# Patient Record
Sex: Female | Born: 1960 | Race: Black or African American | Hispanic: No | Marital: Married | Smoking: Never smoker
Health system: Southern US, Community
[De-identification: ages and names within clinical notes are randomized; demographics above are authoritative.]

## PROBLEM LIST (undated history)

## (undated) DIAGNOSIS — Z87442 Personal history of urinary calculi: Secondary | ICD-10-CM

## (undated) DIAGNOSIS — Z9289 Personal history of other medical treatment: Secondary | ICD-10-CM

## (undated) DIAGNOSIS — E669 Obesity, unspecified: Secondary | ICD-10-CM

## (undated) DIAGNOSIS — I10 Essential (primary) hypertension: Secondary | ICD-10-CM

## (undated) DIAGNOSIS — E119 Type 2 diabetes mellitus without complications: Secondary | ICD-10-CM

## (undated) DIAGNOSIS — F32A Depression, unspecified: Secondary | ICD-10-CM

## (undated) DIAGNOSIS — F329 Major depressive disorder, single episode, unspecified: Secondary | ICD-10-CM

## (undated) DIAGNOSIS — E785 Hyperlipidemia, unspecified: Secondary | ICD-10-CM

## (undated) DIAGNOSIS — F209 Schizophrenia, unspecified: Secondary | ICD-10-CM

## (undated) DIAGNOSIS — Z973 Presence of spectacles and contact lenses: Secondary | ICD-10-CM

## (undated) DIAGNOSIS — N200 Calculus of kidney: Secondary | ICD-10-CM

## (undated) DIAGNOSIS — E876 Hypokalemia: Secondary | ICD-10-CM

## (undated) HISTORY — PX: WISDOM TOOTH EXTRACTION: SHX21

## (undated) HISTORY — DX: Hypokalemia: E87.6

## (undated) HISTORY — DX: Personal history of other medical treatment: Z92.89

## (undated) HISTORY — DX: Obesity, unspecified: E66.9

## (undated) HISTORY — DX: Major depressive disorder, single episode, unspecified: F32.9

## (undated) HISTORY — DX: Essential (primary) hypertension: I10

## (undated) HISTORY — DX: Depression, unspecified: F32.A

## (undated) HISTORY — DX: Schizophrenia, unspecified: F20.9

## (undated) HISTORY — DX: Personal history of urinary calculi: Z87.442

## (undated) HISTORY — DX: Hyperlipidemia, unspecified: E78.5

## (undated) HISTORY — DX: Presence of spectacles and contact lenses: Z97.3

---

## 1997-06-26 ENCOUNTER — Inpatient Hospital Stay (HOSPITAL_COMMUNITY): Admission: AD | Admit: 1997-06-26 | Discharge: 1997-07-10 | Payer: Self-pay | Admitting: Specialist

## 1999-12-13 ENCOUNTER — Encounter: Admission: RE | Admit: 1999-12-13 | Discharge: 1999-12-13 | Payer: Self-pay | Admitting: Emergency Medicine

## 1999-12-13 ENCOUNTER — Encounter: Payer: Self-pay | Admitting: Emergency Medicine

## 2006-08-22 ENCOUNTER — Emergency Department (HOSPITAL_COMMUNITY): Admission: EM | Admit: 2006-08-22 | Discharge: 2006-08-22 | Payer: Self-pay | Admitting: Emergency Medicine

## 2006-09-06 ENCOUNTER — Encounter: Admission: RE | Admit: 2006-09-06 | Discharge: 2006-09-06 | Payer: Self-pay | Admitting: Internal Medicine

## 2006-09-18 ENCOUNTER — Other Ambulatory Visit: Admission: RE | Admit: 2006-09-18 | Discharge: 2006-09-18 | Payer: Self-pay | Admitting: Obstetrics and Gynecology

## 2008-06-23 ENCOUNTER — Ambulatory Visit (HOSPITAL_COMMUNITY): Admission: RE | Admit: 2008-06-23 | Discharge: 2008-06-23 | Payer: Self-pay | Admitting: Cardiovascular Disease

## 2009-01-03 ENCOUNTER — Emergency Department (HOSPITAL_COMMUNITY): Admission: EM | Admit: 2009-01-03 | Discharge: 2009-01-04 | Payer: Self-pay | Admitting: Emergency Medicine

## 2009-01-04 ENCOUNTER — Ambulatory Visit: Payer: Self-pay | Admitting: Psychiatry

## 2009-01-04 ENCOUNTER — Inpatient Hospital Stay (HOSPITAL_COMMUNITY): Admission: RE | Admit: 2009-01-04 | Discharge: 2009-01-19 | Payer: Self-pay | Admitting: Psychiatry

## 2009-01-10 ENCOUNTER — Ambulatory Visit (HOSPITAL_COMMUNITY): Admission: RE | Admit: 2009-01-10 | Discharge: 2009-01-10 | Payer: Self-pay | Admitting: Psychiatry

## 2009-03-28 DIAGNOSIS — Z9289 Personal history of other medical treatment: Secondary | ICD-10-CM

## 2009-03-28 HISTORY — DX: Personal history of other medical treatment: Z92.89

## 2009-06-25 ENCOUNTER — Emergency Department (HOSPITAL_COMMUNITY): Admission: EM | Admit: 2009-06-25 | Discharge: 2009-06-25 | Payer: Self-pay | Admitting: Emergency Medicine

## 2009-12-24 ENCOUNTER — Emergency Department (HOSPITAL_COMMUNITY): Admission: EM | Admit: 2009-12-24 | Discharge: 2009-12-25 | Payer: Self-pay | Admitting: Emergency Medicine

## 2009-12-24 ENCOUNTER — Ambulatory Visit: Payer: Self-pay | Admitting: Psychiatry

## 2009-12-24 ENCOUNTER — Inpatient Hospital Stay (HOSPITAL_COMMUNITY)
Admission: RE | Admit: 2009-12-24 | Discharge: 2009-12-28 | Payer: Self-pay | Source: Home / Self Care | Admitting: Psychiatry

## 2010-06-10 LAB — URINALYSIS, ROUTINE W REFLEX MICROSCOPIC
Glucose, UA: NEGATIVE mg/dL
Hgb urine dipstick: NEGATIVE
Ketones, ur: >80 mg/dL — AB
Leukocytes, UA: NEGATIVE
Nitrite: NEGATIVE
Protein, ur: 100 mg/dL — AB
Specific Gravity, Urine: 1.021 (ref 1.005–1.030)
Urobilinogen, UA: 1 mg/dL (ref 0.0–1.0)
pH: 6 (ref 5.0–8.0)

## 2010-06-10 LAB — ETHANOL: Alcohol, Ethyl (B): <5 mg/dL (ref 0–10)

## 2010-06-10 LAB — DIFFERENTIAL
Basophils Absolute: 0 10*3/uL (ref 0.0–0.1)
Basophils Relative: 1 % (ref 0–1)
Eosinophils Absolute: 0 10*3/uL (ref 0.0–0.7)
Eosinophils Relative: 0 % (ref 0–5)
Lymphocytes Relative: 19 % (ref 12–46)
Lymphs Abs: 1.5 10*3/uL (ref 0.7–4.0)
Monocytes Absolute: 0.5 10*3/uL (ref 0.1–1.0)
Monocytes Relative: 6 % (ref 3–12)
Neutro Abs: 6.1 10*3/uL (ref 1.7–7.7)
Neutrophils Relative %: 74 % (ref 43–77)

## 2010-06-10 LAB — BASIC METABOLIC PANEL
BUN: 23 mg/dL (ref 6–23)
BUN: 12 mg/dL (ref 6–23)
CO2: 30 mEq/L (ref 19–32)
CO2: 29 mEq/L (ref 19–32)
Calcium: 9.7 mg/dL (ref 8.4–10.5)
Calcium: 9.2 mg/dL (ref 8.4–10.5)
Chloride: 102 mEq/L (ref 96–112)
Chloride: 101 mEq/L (ref 96–112)
Creatinine, Ser: 1.01 mg/dL (ref 0.4–1.2)
Creatinine, Ser: 0.67 mg/dL (ref 0.4–1.2)
GFR calc Af Amer: >60 mL/min (ref >60)
GFR calc Af Amer: >60 mL/min (ref >60)
GFR calc non Af Amer: 58 mL/min — ABNORMAL LOW (ref >60)
GFR calc non Af Amer: >60 mL/min (ref >60)
Glucose, Bld: 117 mg/dL — ABNORMAL HIGH (ref 70–99)
Glucose, Bld: 108 mg/dL — ABNORMAL HIGH (ref 70–99)
Potassium: 3.2 mEq/L — ABNORMAL LOW (ref 3.5–5.1)
Potassium: 2.4 mEq/L — CL (ref 3.5–5.1)
Sodium: 139 mEq/L (ref 135–145)
Sodium: 140 mEq/L (ref 135–145)

## 2010-06-10 LAB — RAPID URINE DRUG SCREEN, HOSP PERFORMED
Amphetamines: NOT DETECTED
Barbiturates: NOT DETECTED
Benzodiazepines: NOT DETECTED
Cocaine: NOT DETECTED
Opiates: NOT DETECTED
Tetrahydrocannabinol: NOT DETECTED

## 2010-06-10 LAB — CBC
HCT: 39 % (ref 36.0–46.0)
Hemoglobin: 13.5 g/dL (ref 12.0–15.0)
MCH: 31.4 pg (ref 26.0–34.0)
MCHC: 34.8 g/dL (ref 30.0–36.0)
MCV: 90.3 fL (ref 78.0–100.0)
Platelets: 317 10*3/uL (ref 150–400)
RBC: 4.31 MIL/uL (ref 3.87–5.11)
RDW: 12.6 % (ref 11.5–15.5)
WBC: 8.1 10*3/uL (ref 4.0–10.5)

## 2010-06-10 LAB — URINE MICROSCOPIC-ADD ON

## 2010-06-10 LAB — PREGNANCY, URINE: Preg Test, Ur: NEGATIVE

## 2010-06-10 LAB — POTASSIUM
Potassium: 2.9 mEq/L — ABNORMAL LOW (ref 3.5–5.1)
Potassium: 3.3 mEq/L — ABNORMAL LOW (ref 3.5–5.1)

## 2010-06-10 LAB — MAGNESIUM: Magnesium: 1.9 mg/dL (ref 1.5–2.5)

## 2010-07-01 LAB — BASIC METABOLIC PANEL
BUN: 14 mg/dL (ref 6–23)
BUN: 16 mg/dL (ref 6–23)
BUN: 21 mg/dL (ref 6–23)
CO2: 29 mEq/L (ref 19–32)
CO2: 29 mEq/L (ref 19–32)
CO2: 30 mEq/L (ref 19–32)
Calcium: 8.5 mg/dL (ref 8.4–10.5)
Calcium: 8.7 mg/dL (ref 8.4–10.5)
Calcium: 8.9 mg/dL (ref 8.4–10.5)
Chloride: 100 mEq/L (ref 96–112)
Chloride: 105 mEq/L (ref 96–112)
Chloride: 106 mEq/L (ref 96–112)
Creatinine, Ser: 0.81 mg/dL (ref 0.4–1.2)
Creatinine, Ser: 0.91 mg/dL (ref 0.4–1.2)
Creatinine, Ser: 0.97 mg/dL (ref 0.4–1.2)
GFR calc Af Amer: >60 mL/min (ref >60)
GFR calc Af Amer: >60 mL/min (ref >60)
GFR calc Af Amer: >60 mL/min (ref >60)
GFR calc non Af Amer: >60 mL/min (ref >60)
GFR calc non Af Amer: >60 mL/min (ref >60)
GFR calc non Af Amer: >60 mL/min (ref >60)
Glucose, Bld: 113 mg/dL — ABNORMAL HIGH (ref 70–99)
Glucose, Bld: 160 mg/dL — ABNORMAL HIGH (ref 70–99)
Glucose, Bld: 95 mg/dL (ref 70–99)
Potassium: 2.3 mEq/L — CL (ref 3.5–5.1)
Potassium: 2.8 mEq/L — ABNORMAL LOW (ref 3.5–5.1)
Potassium: 3.5 mEq/L (ref 3.5–5.1)
Sodium: 140 mEq/L (ref 135–145)
Sodium: 142 mEq/L (ref 135–145)
Sodium: 142 mEq/L (ref 135–145)

## 2010-07-01 LAB — RAPID URINE DRUG SCREEN, HOSP PERFORMED
Amphetamines: NOT DETECTED
Barbiturates: NOT DETECTED
Benzodiazepines: NOT DETECTED
Cocaine: NOT DETECTED
Opiates: NOT DETECTED
Tetrahydrocannabinol: NOT DETECTED

## 2010-07-01 LAB — DIFFERENTIAL
Basophils Absolute: 0.1 10*3/uL (ref 0.0–0.1)
Basophils Relative: 1 % (ref 0–1)
Eosinophils Absolute: 0.3 10*3/uL (ref 0.0–0.7)
Eosinophils Relative: 3 % (ref 0–5)
Lymphocytes Relative: 19 % (ref 12–46)
Lymphs Abs: 2 10*3/uL (ref 0.7–4.0)
Monocytes Absolute: 0.7 10*3/uL (ref 0.1–1.0)
Monocytes Relative: 7 % (ref 3–12)
Neutro Abs: 7.3 10*3/uL (ref 1.7–7.7)
Neutrophils Relative %: 70 % (ref 43–77)

## 2010-07-01 LAB — CBC
HCT: 37.8 % (ref 36.0–46.0)
HCT: 37.4 % (ref 36.0–46.0)
Hemoglobin: 12.6 g/dL (ref 12.0–15.0)
Hemoglobin: 12.5 g/dL (ref 12.0–15.0)
MCHC: 33.5 g/dL (ref 30.0–36.0)
MCHC: 33.4 g/dL (ref 30.0–36.0)
MCV: 91.4 fL (ref 78.0–100.0)
MCV: 91.7 fL (ref 78.0–100.0)
Platelets: 353 10*3/uL (ref 150–400)
Platelets: 327 10*3/uL (ref 150–400)
RBC: 4.13 MIL/uL (ref 3.87–5.11)
RBC: 4.08 MIL/uL (ref 3.87–5.11)
RDW: 13.3 % (ref 11.5–15.5)
RDW: 13.4 % (ref 11.5–15.5)
WBC: 8.7 10*3/uL (ref 4.0–10.5)
WBC: 10.3 10*3/uL (ref 4.0–10.5)

## 2010-07-01 LAB — TRICYCLICS SCREEN, URINE: TCA Scrn: NOT DETECTED

## 2010-07-01 LAB — ETHANOL: Alcohol, Ethyl (B): <5 mg/dL (ref 0–10)

## 2010-07-02 ENCOUNTER — Emergency Department (HOSPITAL_COMMUNITY)
Admission: EM | Admit: 2010-07-02 | Discharge: 2010-07-04 | Disposition: A | Discharge status: Other Acute Inpatient Hospital | Payer: PRIVATE HEALTH INSURANCE | Attending: Emergency Medicine | Admitting: Emergency Medicine

## 2010-07-02 DIAGNOSIS — I10 Essential (primary) hypertension: Secondary | ICD-10-CM | POA: Insufficient documentation

## 2010-07-02 DIAGNOSIS — E876 Hypokalemia: Secondary | ICD-10-CM | POA: Insufficient documentation

## 2010-07-02 DIAGNOSIS — IMO0002 Reserved for concepts with insufficient information to code with codable children: Secondary | ICD-10-CM | POA: Insufficient documentation

## 2010-07-02 DIAGNOSIS — F29 Unspecified psychosis not due to a substance or known physiological condition: Secondary | ICD-10-CM | POA: Insufficient documentation

## 2010-07-02 LAB — URINALYSIS, ROUTINE W REFLEX MICROSCOPIC
Bilirubin Urine: NEGATIVE
Glucose, UA: NEGATIVE mg/dL
Hgb urine dipstick: NEGATIVE
Nitrite: NEGATIVE
Protein, ur: 100 mg/dL — AB
Specific Gravity, Urine: 1.023 (ref 1.005–1.030)
Urobilinogen, UA: 1 mg/dL (ref 0.0–1.0)
pH: 6.5 (ref 5.0–8.0)

## 2010-07-02 LAB — RAPID URINE DRUG SCREEN, HOSP PERFORMED
Amphetamines: NOT DETECTED
Barbiturates: NOT DETECTED
Benzodiazepines: NOT DETECTED
Cocaine: NOT DETECTED
Opiates: NOT DETECTED
Tetrahydrocannabinol: NOT DETECTED

## 2010-07-02 LAB — COMPREHENSIVE METABOLIC PANEL
ALT: 18 U/L (ref 0–35)
AST: 24 U/L (ref 0–37)
Albumin: 4.4 g/dL (ref 3.5–5.2)
Alkaline Phosphatase: 57 U/L (ref 39–117)
BUN: 14 mg/dL (ref 6–23)
CO2: 30 mEq/L (ref 19–32)
Calcium: 9.7 mg/dL (ref 8.4–10.5)
Chloride: 104 mEq/L (ref 96–112)
Creatinine, Ser: 0.85 mg/dL (ref 0.4–1.2)
GFR calc Af Amer: >60 mL/min (ref >60)
GFR calc non Af Amer: >60 mL/min (ref >60)
Glucose, Bld: 119 mg/dL — ABNORMAL HIGH (ref 70–99)
Potassium: 2.7 mEq/L — CL (ref 3.5–5.1)
Sodium: 146 mEq/L — ABNORMAL HIGH (ref 135–145)
Total Bilirubin: 1 mg/dL (ref 0.3–1.2)
Total Protein: 8 g/dL (ref 6.0–8.3)

## 2010-07-02 LAB — URINE MICROSCOPIC-ADD ON

## 2010-07-02 LAB — POCT CARDIAC MARKERS
CKMB, poc: 2.3 ng/mL (ref 1.0–8.0)
Myoglobin, poc: 64.4 ng/mL (ref 12–200)
Troponin i, poc: <0.05 ng/mL (ref 0.00–0.09)

## 2010-07-02 LAB — ETHANOL: Alcohol, Ethyl (B): <5 mg/dL (ref 0–10)

## 2010-07-02 LAB — ACETAMINOPHEN LEVEL: Acetaminophen (Tylenol), Serum: <10 ug/mL — ABNORMAL LOW (ref 10–30)

## 2010-07-02 LAB — SALICYLATE LEVEL: Salicylate Lvl: <4 mg/dL (ref 2.8–20.0)

## 2010-07-02 LAB — TRICYCLICS SCREEN, URINE: TCA Scrn: NOT DETECTED

## 2010-07-03 DIAGNOSIS — F200 Paranoid schizophrenia: Secondary | ICD-10-CM

## 2010-07-03 LAB — POCT I-STAT, CHEM 8
BUN: 16 mg/dL (ref 6–23)
BUN: 20 mg/dL (ref 6–23)
Calcium, Ion: 1.12 mmol/L (ref 1.12–1.32)
Calcium, Ion: 1.11 mmol/L — ABNORMAL LOW (ref 1.12–1.32)
Chloride: 100 mEq/L (ref 96–112)
Chloride: 99 mEq/L (ref 96–112)
Creatinine, Ser: 1.3 mg/dL — ABNORMAL HIGH (ref 0.4–1.2)
Creatinine, Ser: 1.4 mg/dL — ABNORMAL HIGH (ref 0.4–1.2)
Glucose, Bld: 106 mg/dL — ABNORMAL HIGH (ref 70–99)
Glucose, Bld: 127 mg/dL — ABNORMAL HIGH (ref 70–99)
HCT: 42 % (ref 36.0–46.0)
HCT: 43 % (ref 36.0–46.0)
Hemoglobin: 14.3 g/dL (ref 12.0–15.0)
Hemoglobin: 14.6 g/dL (ref 12.0–15.0)
Potassium: 2.4 mEq/L — CL (ref 3.5–5.1)
Potassium: 3.1 mEq/L — ABNORMAL LOW (ref 3.5–5.1)
Sodium: 143 mEq/L (ref 135–145)
Sodium: 142 mEq/L (ref 135–145)
TCO2: 28 mmol/L (ref 0–100)
TCO2: 31 mmol/L (ref 0–100)

## 2010-07-04 ENCOUNTER — Inpatient Hospital Stay (HOSPITAL_COMMUNITY)
Admission: AD | Admit: 2010-07-04 | Discharge: 2010-08-06 | DRG: 885 | Disposition: A | Discharge status: Home / Self Care | Payer: PRIVATE HEALTH INSURANCE | Source: Ambulatory Visit | Attending: Psychiatry | Admitting: Psychiatry

## 2010-07-04 DIAGNOSIS — I517 Cardiomegaly: Secondary | ICD-10-CM

## 2010-07-04 DIAGNOSIS — F29 Unspecified psychosis not due to a substance or known physiological condition: Principal | ICD-10-CM

## 2010-07-04 DIAGNOSIS — E876 Hypokalemia: Secondary | ICD-10-CM

## 2010-07-04 DIAGNOSIS — F201 Disorganized schizophrenia: Secondary | ICD-10-CM

## 2010-07-04 DIAGNOSIS — F200 Paranoid schizophrenia: Secondary | ICD-10-CM

## 2010-07-04 DIAGNOSIS — Z91199 Patient's noncompliance with other medical treatment and regimen due to unspecified reason: Secondary | ICD-10-CM

## 2010-07-04 DIAGNOSIS — I10 Essential (primary) hypertension: Secondary | ICD-10-CM

## 2010-07-04 DIAGNOSIS — F429 Obsessive-compulsive disorder, unspecified: Secondary | ICD-10-CM

## 2010-07-04 DIAGNOSIS — Z9119 Patient's noncompliance with other medical treatment and regimen: Secondary | ICD-10-CM

## 2010-07-05 NOTE — Consult Note (Signed)
Amy Casey, Amy Casey             ACCOUNT NO.:  [id number]  MEDICAL RECORD NO.:  [id number]           PATIENT TYPE:  E  LOCATION:  WLED                         FACILITY:  [facility]  PHYSICIAN:  Eulogio Ditch, MD DATE OF BIRTH:  [birth year]  DATE OF CONSULTATION:  07/03/2010 DATE OF DISCHARGE:                                CONSULTATION   REASON FOR CONSULT:  Noncompliant with medications, history of schizophrenia.  HISTORY OF PRESENT ILLNESS:  I saw the patient and reviewed the medical records.  A 50 year old [place] American female who was brought in by the husband as she was noncompliant with her medications.  The patient at this time is confused, unable to make logical conversation, poor eye contact.  The patient appeared to be responding to the inner stimuli. As per the husband, the patient does not take medication because she does not like the taste of the medication.  The patient also pacing in the hallway, did not sleep last night.  The patient denied any suicidal or homicidal ideations.  Denies hearing any voices.  The patient is paranoid at this time.  PAST PSYCHIATRIC HISTORY:  The patient has been admitted to behavioral health in the past in 2011 and was discharged on Risperdal 1.5 mg per day along with Cogentin 2 mg a day.  Currently, the patient is on fluoxetine and thiothixene.  MEDICAL HISTORY:  History of hypertension.  ALLERGIES:  No known drug allergies.  PHYSICAL EXAMINATION:  Done in the [facility] ED within normal limits. VITAL SIGNS:  Her blood pressure is high 206/90 and then at another time it was 211/128.  LABORATORY DATA:  UDS negative.  Alcohol level less than 5.  Labs within normal limits except for potassium, which is 2.7.  Physical examination done by Dr. Carleene Cooper is within normal limits.  MENTAL STATUS EXAMINATION:  The patient is fairly cooperative during the interview.  Poor eye contact.  No psychomotor agitation,  retardation noted during the interview, but earlier the patient was agitated during the nighttime and was given Geodon 20 mg IM.  Speech is soft, slow. Thought process, unable to provide logical information, seems to be internally preoccupied, confused.  The patient denies any suicidal or homicidal ideations.  Paranoid delusions present.  Alert, awake, oriented x3.  Memory immediate, recent, remote poor.  Attention concentration poor.  Abstraction ability poor.  Insight and judgment poor.  DIAGNOSES:  Axis I:  Schizophrenia, disorganized type. Axis II:  Deferred. Axis III:  Hypertension, heart murmur. Axis IV:  Noncompliant with her medications. Axis V:  30.  RECOMMENDATIONS: 1. The patient will be admitted to behavioral health for further     stabilization. 2. The patient will be started on Risperdal 1 mg twice a day along     with Benadryl 50 mg at bedtime.  The patient will be continued on     her hypertensive medications lisinopril 20 mg p.o. daily and     amlodipine 5 mg p.o. daily along with verapamil SR 250 mg p.o.     daily.     Eulogio Ditch, MD     SA/MEDQ  D:  07/03/2010  T:  07/03/2010  Job:  [id number]  Electronically Signed by Eulogio Ditch  on 07/05/2010 11:51:24 AM

## 2010-07-08 LAB — TSH: TSH: 0.686 u[IU]/mL (ref 0.350–4.500)

## 2010-07-08 LAB — BASIC METABOLIC PANEL
BUN: 22 mg/dL (ref 6–23)
CO2: 28 mEq/L (ref 19–32)
Calcium: 9.7 mg/dL (ref 8.4–10.5)
Chloride: 103 mEq/L (ref 96–112)
Creatinine, Ser: 1.04 mg/dL (ref 0.4–1.2)
GFR calc Af Amer: >60 mL/min (ref >60)
GFR calc non Af Amer: 56 mL/min — ABNORMAL LOW (ref >60)
Glucose, Bld: 111 mg/dL — ABNORMAL HIGH (ref 70–99)
Potassium: 3.8 mEq/L (ref 3.5–5.1)
Sodium: 142 mEq/L (ref 135–145)

## 2010-07-08 LAB — MAGNESIUM: Magnesium: 2.4 mg/dL (ref 1.5–2.5)

## 2010-07-13 NOTE — H&P (Signed)
Amy Casey, Amy Casey             ACCOUNT NO.:  [id number]  MEDICAL RECORD NO.:  [id number]           PATIENT TYPE:  I  LOCATION:  0405                          [facility]  [facility]  PHYSICIAN:  Eulogio Ditch, MD DATE OF BIRTH:  1960-05-02  DATE OF ADMISSION:  07/04/2010 DATE OF DISCHARGE:                      PSYCHIATRIC ADMISSION ASSESSMENT   HISTORY:  The patient is a 50 year old married African American female. Patient presented to the emergency room in the company of her husband who reported that the patient was having an altered mental status.  This would be the third admission for the patient.  She has had two previous admissions here at [facility].  The patient states that two of her medications were messed up causing her to have some mixed thoughts and notes that she was having auditory hallucinations versus speaking to God.  The patient states that she had missed some of  her medications. Her last visit at [facility] was in October 2011.  SOCIAL HISTORY:  She is married and lives with her husband who works for [employer].  She has a 6 year old daughter and an 7 year old son.  She works at [employer] on [address].  She is a nonsmoker, nondrinker.  No alcohol or drug abuse.  FAMILY HISTORY:  Not reliable at this point.  PAST MEDICAL HISTORY: 1. She has a history of severe hypertension. 2. She sees Dr. Francene Castle as her psychiatrist and [facility] in [place]. 3. She also has a history of a heart murmur.  CURRENT MEDICATIONS: 1. Saphris sublingual as needed.  The patient reports that she had not     taken that in several days. 2. Lisinopril 40 mg p.o. daily. 3. Fluoxetine 20 mg p.o. daily. 4. Norvasc and notes that multiple medications have been changed     recently.  ALLERGIES:  DENIES ANY CURRENT DRUG ALLERGIES.  PHYSICAL EXAMINATION:  Physical exam was in the emergency room by Dr. Carleene Cooper and is on the chart. The patient had an unremarkable physical exam with the exception of a change in her EKG which showed left axis deviation with mild left ventricular hypertrophy.  Cardiac enzymes were done and all were normal. Physical exam did not reveal anything remarkable.  The hypokalemia was treated to a respectable amount of 3.1.  LABORATORY DATA:  Other pertinent labs include a negative drug screen. Acetaminophen level less than 10.  Initial comprehensive metabolic profile showed an elevated sodium of 146 as well as a glucose of 119. Alcohol level was less than 5.  Salicylate was less than 4.  Cardiac markers were within normal limits.  Repeat comprehensive metabolic profile showed elevated sodium of 146, alarming potassium at 2.7 which was recompensated in the emergency room and treated successfully to a level of 3.1.  Urine was checked again for tricyclics and also none were detected there.  Urine showed trace ketones as well as protein of 100 mg/dL, positive for leukocyte esterase and moderate amount.  Microscopic showed a few epithelials and rare bacteria.  MENTAL STATUS EXAM:  Today, the patient is alert and  oriented to location and date.  She is a well-developed, well-nourished [place] female, casually dressed.  Speech is rambling and disorganized. She is cooperative.  Her mood is calm.  Her affect is flat.  Thought process, again she is endorsing auditory hallucinations, feeling that God is speaking directly to her.  Cognition unable to assess.  She denies suicidality or homicidality and is of at least average intelligence.  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 AXIS II:  No diagnosis. AXIS III:  Hypertension, heart murmur, abnormal electrocardiogram, medication noncompliant and the burden of chronic illness. AXIS IV:  She has good family support. AXIS V:  Current global assessment of functioning 40, past year  unknown.  PLAN:  The patient will be admitted for treatment and stabilization. Tentative length of stay is 3-5 days.    ______________________________ Verne Spurr, PA   ______________________________ Eulogio Ditch, MD    NM/MEDQ  D:  07/05/2010  T:  07/05/2010  Job:  [id number]  Electronically Signed by Verne Spurr  on 07/12/2010 04:08:10 PM Electronically Signed by Eulogio Ditch  on 07/13/2010 06:55:25 PM

## 2010-07-14 LAB — URINE MICROSCOPIC-ADD ON

## 2010-07-14 LAB — URINALYSIS, ROUTINE W REFLEX MICROSCOPIC
Glucose, UA: NEGATIVE mg/dL
Ketones, ur: 40 mg/dL — AB
Nitrite: NEGATIVE
Protein, ur: NEGATIVE mg/dL
Specific Gravity, Urine: 1.024 (ref 1.005–1.030)
Urobilinogen, UA: 1 mg/dL (ref 0.0–1.0)
pH: 5.5 (ref 5.0–8.0)

## 2010-07-14 LAB — COMPREHENSIVE METABOLIC PANEL
ALT: 26 U/L (ref 0–35)
AST: 18 U/L (ref 0–37)
Albumin: 3.9 g/dL (ref 3.5–5.2)
Alkaline Phosphatase: 46 U/L (ref 39–117)
BUN: 20 mg/dL (ref 6–23)
CO2: 28 mEq/L (ref 19–32)
Calcium: 9.5 mg/dL (ref 8.4–10.5)
Chloride: 104 mEq/L (ref 96–112)
Creatinine, Ser: 0.96 mg/dL (ref 0.4–1.2)
GFR calc Af Amer: >60 mL/min (ref >60)
GFR calc non Af Amer: >60 mL/min (ref >60)
Glucose, Bld: 194 mg/dL — ABNORMAL HIGH (ref 70–99)
Potassium: 3.8 mEq/L (ref 3.5–5.1)
Sodium: 140 mEq/L (ref 135–145)
Total Bilirubin: 0.5 mg/dL (ref 0.3–1.2)
Total Protein: 7.6 g/dL (ref 6.0–8.3)

## 2010-07-27 DIAGNOSIS — F209 Schizophrenia, unspecified: Secondary | ICD-10-CM

## 2010-08-06 NOTE — Discharge Summary (Signed)
Amy Casey, Amy Casey             ACCOUNT NO.:  [id number]  MEDICAL RECORD NO.:  [id number]           PATIENT TYPE:  I  LOCATION:  0405                          [facility]  PHYSICIAN:  Eulogio Ditch, MD DATE OF BIRTH:  [birth year]  DATE OF ADMISSION:  07/04/2010 DATE OF DISCHARGE:  08/06/2010                              DISCHARGE SUMMARY   HISTORY OF PRESENT ILLNESS:  A 50 year old African American female who was admitted from [facility] ED where she was brought in by the husband for altered mental status.  Patient was very disorganized.  She reported that her medications were messed up byherself.  HOSPITAL COURSE:  Patient was admitted to [facility].  At the time of admission, patient was very disorganized, was unable to talk logically, was selectively mute, was internally preoccupied.  Her affect was constricted.  As patient was getting Invega in the outpatient setting, patient was started on Risperdal 1 mg twice a day, along with Saphris 10 mg p.o. daily and 20 mg at bedtime.  On July 06, 2010, her Risperdal was changed to 2 mg at bedtime.  On July 07, 2010, her Risperdal was discontinued.  Patient was also given Haldol 5 mg IM every 6 hours p.r.n. for agitation.  On July 09, 2010, her Saphris was reduced to 20 mg at bedtime as patient was very sedated, confused, lying all the time in the bed.  Patient was also started on Klonopin 0.5 mg twice a day as patient was feeling very anxious.  On July 10, 2010, her Saphris was reduced to 10 mg at bedtime and started on Cogentin 1 mg twice a day as patient was drooling.  On July 14, 2010, patient was started on Invega 9 mg, along with Prozac 60 mg p.o. daily, and her Saphris was discontinued, along with Cogentin and the Klonopin.  On July 17, 2010, patient was seen on the weekend by Dr. Lolly Mustache and Hinda Glatter was reduced to 6 mg as patient was sedated.  As patient was not responding to the [facility] also, patient was  started on Zyprexa 10 mg at bedtime on July 26, 2010, and her Hinda Glatter later on on Jul 29, 2010, was discontinued.  Patient showed a good response to the Zyprexa which was increased on Aug 02, 2010, to 20 mg.  By Jul 30, 2010, the patient started talking logically, her affect improved, was able to make a good conversation.  No side effects like EPS were present.  As patient was doing much better and denies hearing any voices, denies paranoid behavior, it was thought that patient will do better in her home environment and there is no need to transfer her to [facility] so patient will be discharged back to home today.  Psychoeducation given to the patient that she should remain compliant with her medication and follow up regularly in the outpatient setting.  DISCHARGE DIAGNOSES:  AXIS I:  Psychosis, not otherwise specified, rule out schizophrenia, disorganized type. AXIS II:  Deferred. AXIS III:  Hypertension. AXIS IV:  Chronic mental health issues. AXIS V:  50.  DISCHARGE FOLLOWUP:  Patient will follow with Dr. Mila Homer  at [facility], phone number [phone], appointment on Aug 09, 2010, at 12 p.m.  DISCHARGE MEDICATIONS: 1. Prozac 60 mg p.o. daily. 2. Lisinopril 40 mg p.o. daily. 3. Norvasc 5 mg p.o. daily. 4. Cogentin 2 mg at bedtime. 5. Zyprexa 20 mg at bedtime.     Eulogio Ditch, MD     SA/MEDQ  D:  08/06/2010  T:  08/06/2010  Job:  [id number]  Electronically Signed by Eulogio Ditch  on 08/06/2010 01:54:41 PM
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
## 2011-10-01 IMAGING — CR DG SHOULDER 2+V*L*
3 series · 3 of 3 positions shown · non-contrast
Comparison: None

CLINICAL DATA: Left shoulder pain.

LEFT SHOULDER - 2+ VIEW

[w shoulder ap internal left]
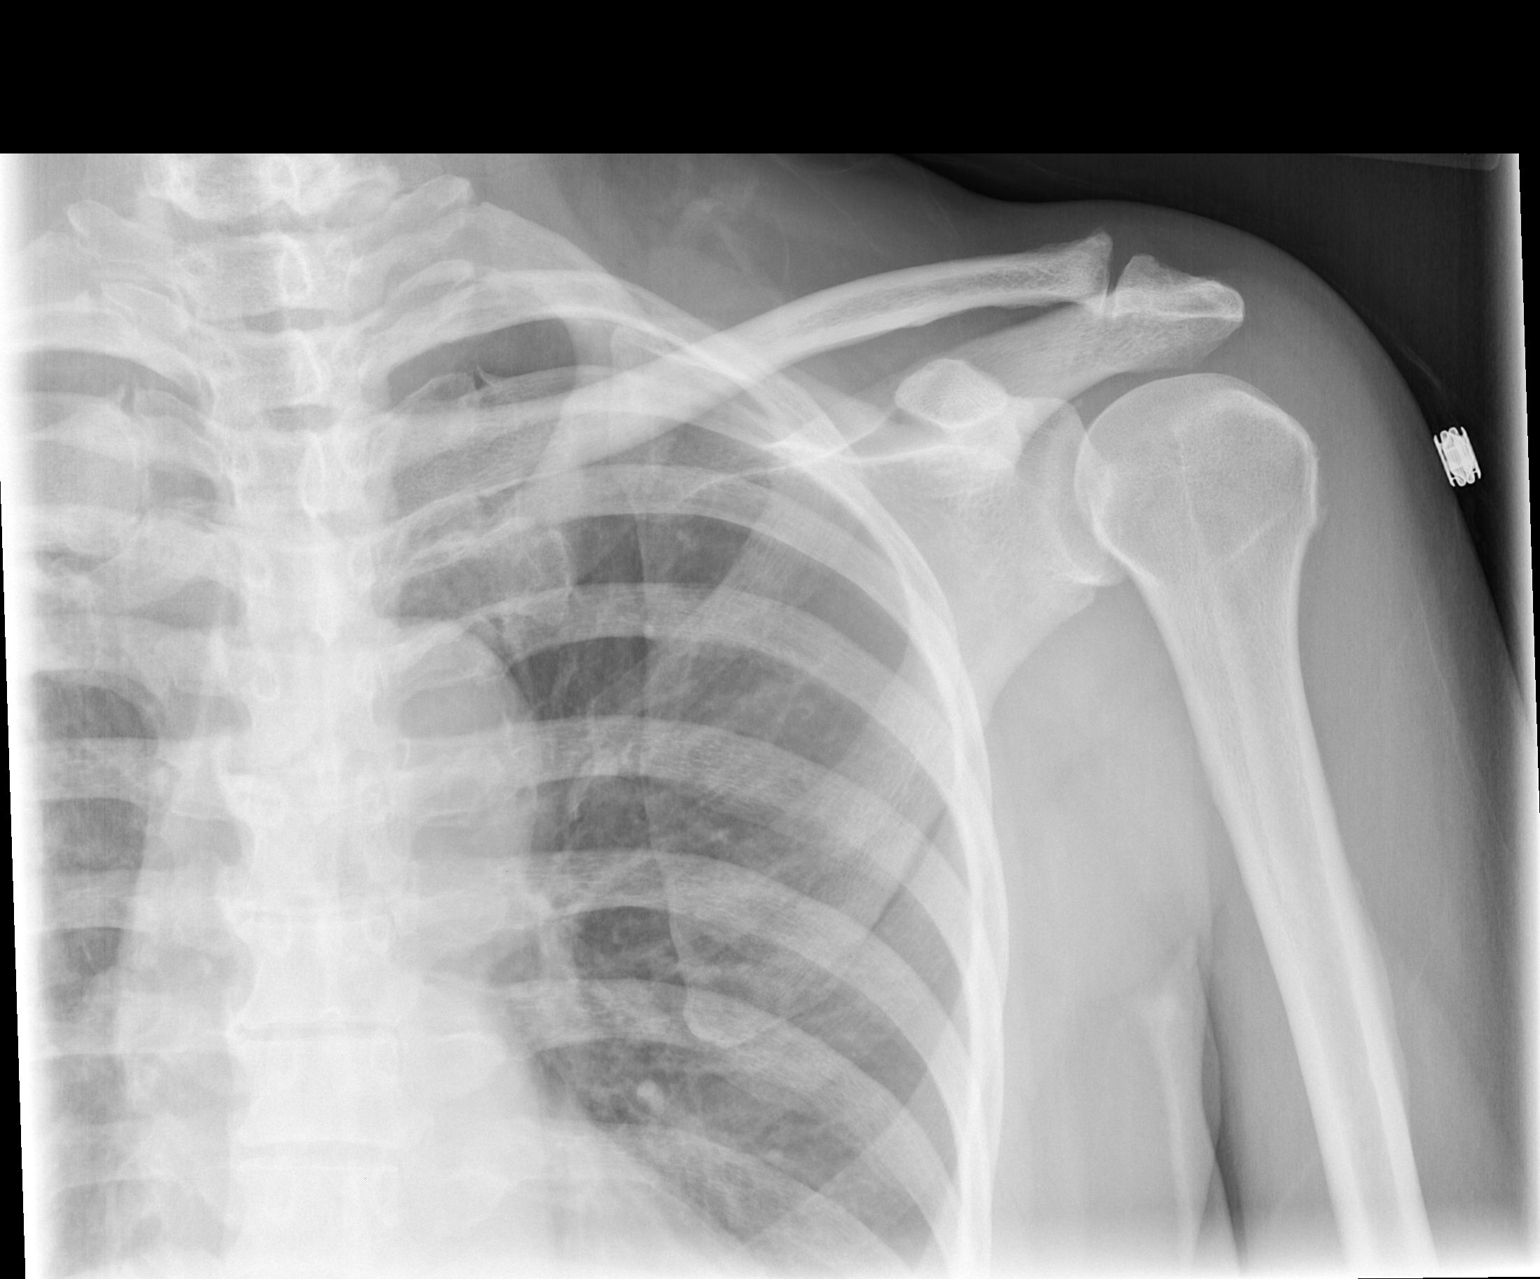

[w shoulder ap external left]
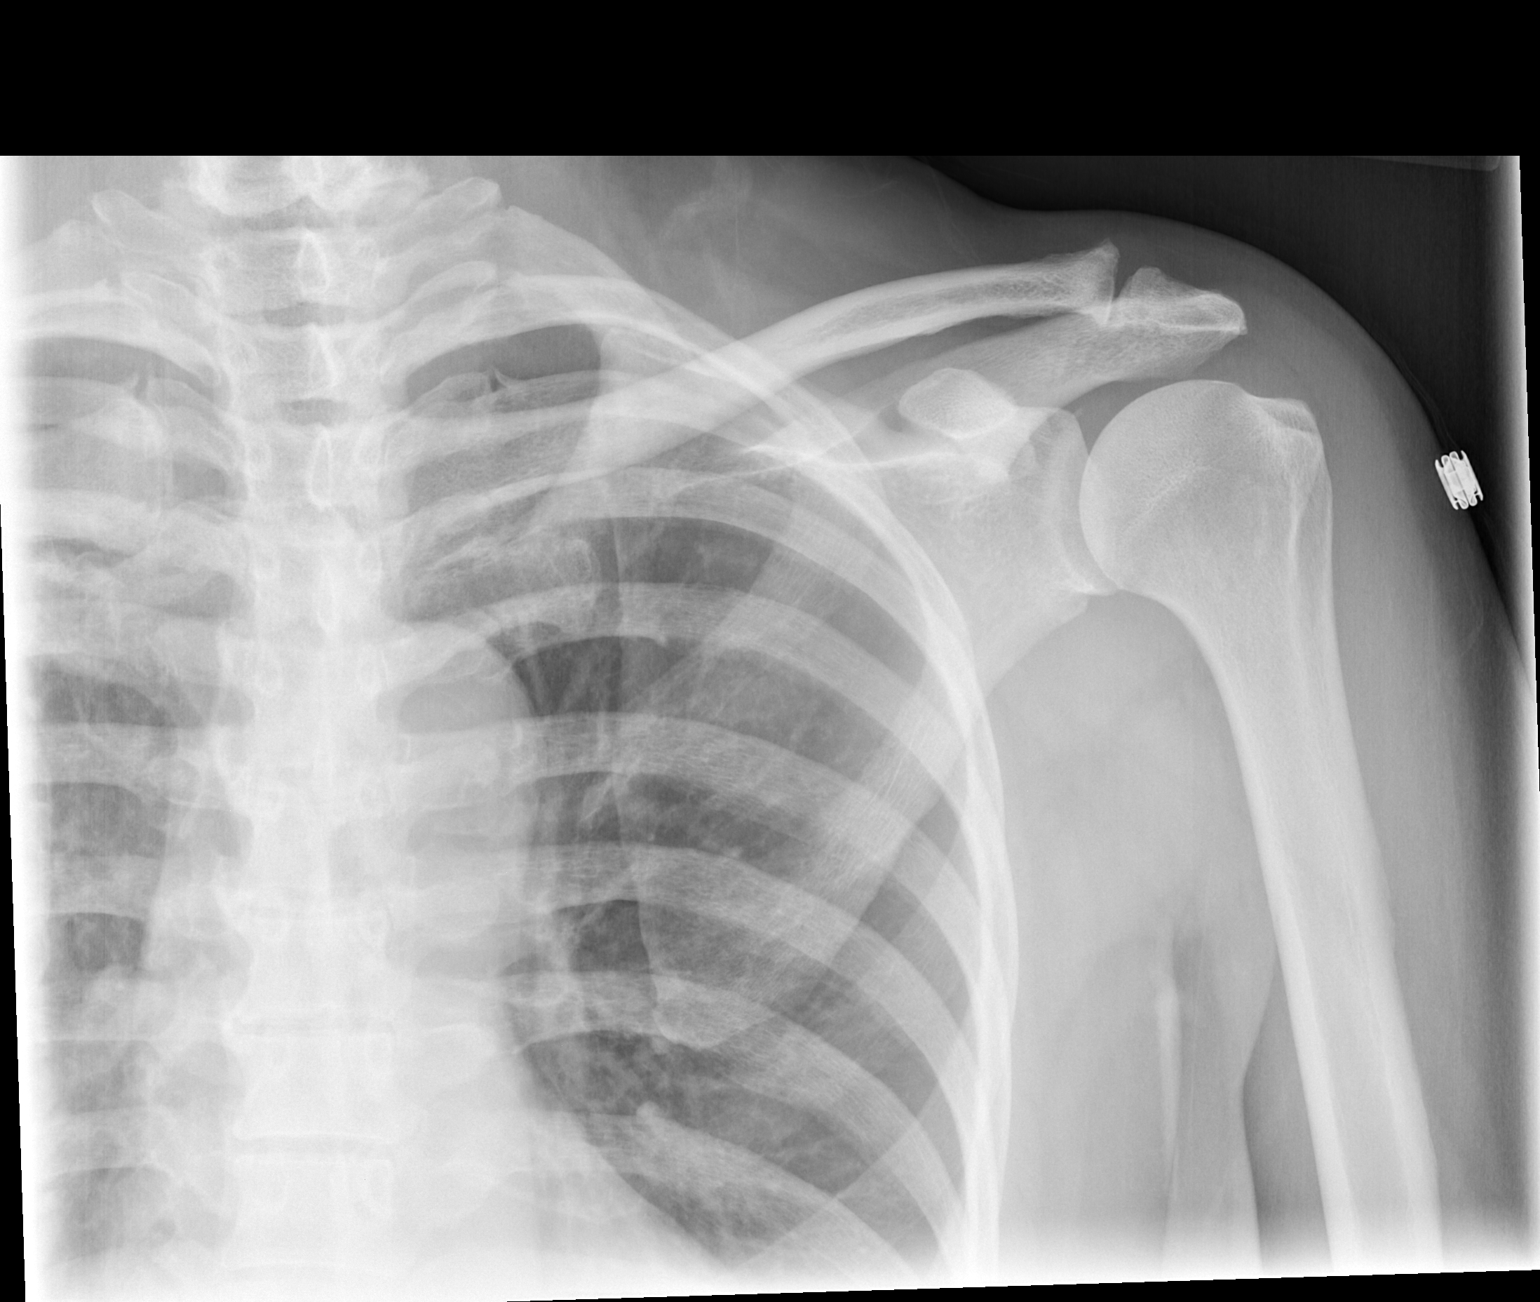

[w shoulder y view left]
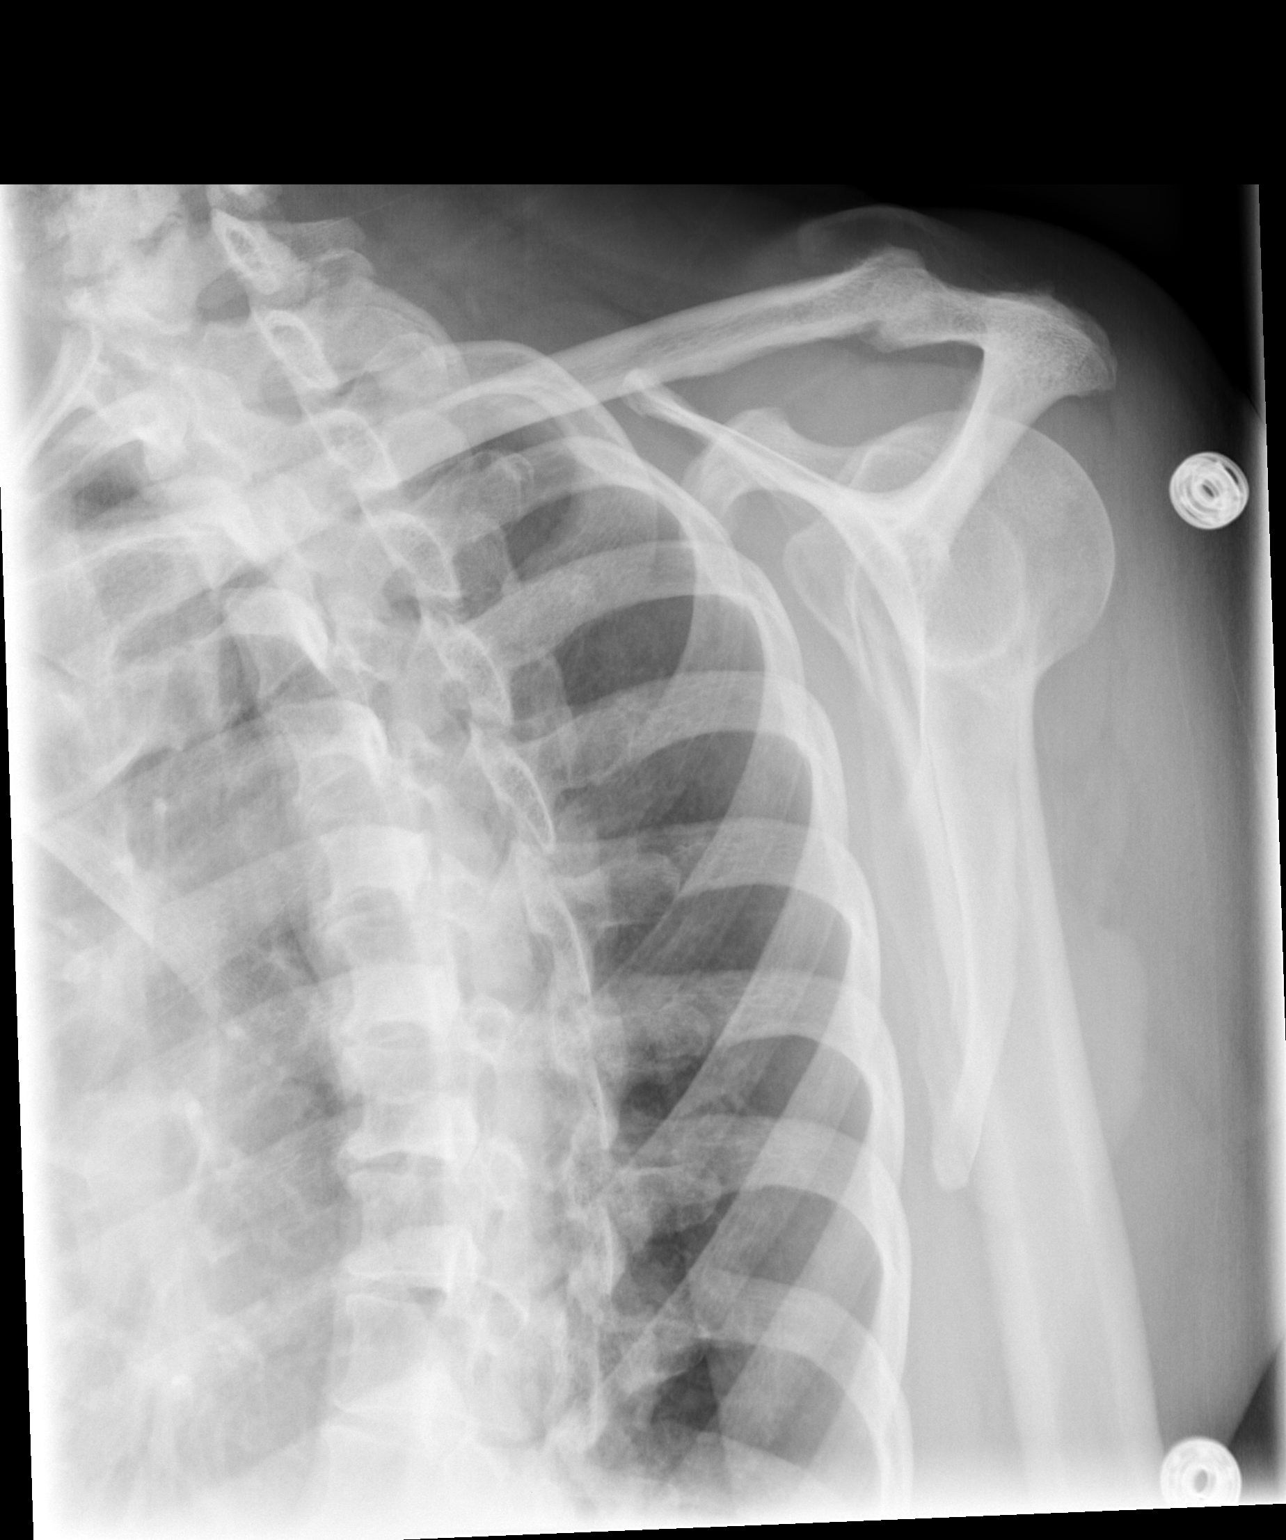

[3 of 3 positions shown; findings below may reference images not displayed]

FINDINGS: There are mild AC joint degenerative changes.  No acute
bony findings.  The left lung apex is clear.  No upper left-sided
rib fractures are seen.
IMPRESSION: No acute bony findings.

## 2012-02-10 ENCOUNTER — Ambulatory Visit (INDEPENDENT_AMBULATORY_CARE_PROVIDER_SITE_OTHER): Payer: No Typology Code available for payment source | Admitting: Medical

## 2012-02-10 ENCOUNTER — Encounter: Payer: Self-pay | Admitting: Medical

## 2012-02-10 ENCOUNTER — Other Ambulatory Visit (HOSPITAL_COMMUNITY)
Admission: RE | Admit: 2012-02-10 | Discharge: 2012-02-10 | Disposition: A | Discharge status: Home / Self Care | Payer: No Typology Code available for payment source | Source: Ambulatory Visit | Attending: Medical | Admitting: Medical

## 2012-02-10 ENCOUNTER — Telehealth: Payer: Self-pay | Admitting: Internal Medicine

## 2012-02-10 VITALS — BP 142/90 | HR 60 | Temp 98.0°F | Resp 16 | Ht 69.0 in | Wt 204.0 lb

## 2012-02-10 DIAGNOSIS — K029 Dental caries, unspecified: Secondary | ICD-10-CM

## 2012-02-10 DIAGNOSIS — Z124 Encounter for screening for malignant neoplasm of cervix: Secondary | ICD-10-CM

## 2012-02-10 DIAGNOSIS — M79606 Pain in leg, unspecified: Secondary | ICD-10-CM

## 2012-02-10 DIAGNOSIS — Z0000 Encounter for general adult medical examination without abnormal findings: Secondary | ICD-10-CM

## 2012-02-10 DIAGNOSIS — Z1231 Encounter for screening mammogram for malignant neoplasm of breast: Secondary | ICD-10-CM

## 2012-02-10 DIAGNOSIS — R7301 Impaired fasting glucose: Secondary | ICD-10-CM

## 2012-02-10 DIAGNOSIS — E669 Obesity, unspecified: Secondary | ICD-10-CM

## 2012-02-10 DIAGNOSIS — M79609 Pain in unspecified limb: Secondary | ICD-10-CM

## 2012-02-10 DIAGNOSIS — Z1211 Encounter for screening for malignant neoplasm of colon: Secondary | ICD-10-CM

## 2012-02-10 DIAGNOSIS — I10 Essential (primary) hypertension: Secondary | ICD-10-CM

## 2012-02-10 DIAGNOSIS — Z23 Encounter for immunization: Secondary | ICD-10-CM

## 2012-02-10 DIAGNOSIS — Z1239 Encounter for other screening for malignant neoplasm of breast: Secondary | ICD-10-CM

## 2012-02-10 DIAGNOSIS — R21 Rash and other nonspecific skin eruption: Secondary | ICD-10-CM

## 2012-02-10 DIAGNOSIS — Z01419 Encounter for gynecological examination (general) (routine) without abnormal findings: Secondary | ICD-10-CM | POA: Insufficient documentation

## 2012-02-10 DIAGNOSIS — Z1151 Encounter for screening for human papillomavirus (HPV): Secondary | ICD-10-CM | POA: Insufficient documentation

## 2012-02-10 LAB — LIPID PANEL
Cholesterol: 180 mg/dL (ref 0–200)
HDL: 57 mg/dL (ref >39)
LDL Cholesterol: 98 mg/dL (ref 0–99)
Total CHOL/HDL Ratio: 3.2 Ratio
Triglycerides: 125 mg/dL (ref <150)
VLDL: 25 mg/dL (ref 0–40)

## 2012-02-10 LAB — COMPREHENSIVE METABOLIC PANEL
ALT: 12 U/L (ref 0–35)
AST: 14 U/L (ref 0–37)
Albumin: 4.5 g/dL (ref 3.5–5.2)
Alkaline Phosphatase: 60 U/L (ref 39–117)
BUN: 15 mg/dL (ref 6–23)
CO2: 28 mEq/L (ref 19–32)
Calcium: 9.4 mg/dL (ref 8.4–10.5)
Chloride: 104 mEq/L (ref 96–112)
Creat: 0.76 mg/dL (ref 0.50–1.10)
Glucose, Bld: 88 mg/dL (ref 70–99)
Potassium: 3.4 mEq/L — ABNORMAL LOW (ref 3.5–5.3)
Sodium: 140 mEq/L (ref 135–145)
Total Bilirubin: 0.3 mg/dL (ref 0.3–1.2)
Total Protein: 7.3 g/dL (ref 6.0–8.3)

## 2012-02-10 LAB — CBC WITH DIFFERENTIAL/PLATELET
Basophils Absolute: 0 10*3/uL (ref 0.0–0.1)
Basophils Relative: 0 % (ref 0–1)
Eosinophils Absolute: 0.1 10*3/uL (ref 0.0–0.7)
Eosinophils Relative: 2 % (ref 0–5)
HCT: 38.4 % (ref 36.0–46.0)
Hemoglobin: 12.9 g/dL (ref 12.0–15.0)
Lymphocytes Relative: 34 % (ref 12–46)
Lymphs Abs: 2.2 10*3/uL (ref 0.7–4.0)
MCH: 28.7 pg (ref 26.0–34.0)
MCHC: 33.6 g/dL (ref 30.0–36.0)
MCV: 85.5 fL (ref 78.0–100.0)
Monocytes Absolute: 0.4 10*3/uL (ref 0.1–1.0)
Monocytes Relative: 7 % (ref 3–12)
Neutro Abs: 3.8 10*3/uL (ref 1.7–7.7)
Neutrophils Relative %: 57 % (ref 43–77)
Platelets: 337 10*3/uL (ref 150–400)
RBC: 4.49 MIL/uL (ref 3.87–5.11)
RDW: 13.7 % (ref 11.5–15.5)
WBC: 6.6 10*3/uL (ref 4.0–10.5)

## 2012-02-10 LAB — POCT URINALYSIS DIPSTICK
Bilirubin, UA: NEGATIVE
Blood, UA: NEGATIVE
Glucose, UA: NEGATIVE
Ketones, UA: NEGATIVE
Leukocytes, UA: NEGATIVE
Nitrite, UA: NEGATIVE
Protein, UA: NEGATIVE
Spec Grav, UA: <=1.005
Urobilinogen, UA: NEGATIVE
pH, UA: 7

## 2012-02-10 LAB — TSH: TSH: 1.514 u[IU]/mL (ref 0.350–4.500)

## 2012-02-10 MED ORDER — LISINOPRIL-HYDROCHLOROTHIAZIDE 20-12.5 MG PO TABS: 1.0000 | ORAL_TABLET | Freq: Every day | ORAL | Status: DC

## 2012-02-10 MED ORDER — AMLODIPINE BESYLATE 5 MG PO TABS: 5.0000 mg | ORAL_TABLET | Freq: Every day | ORAL | Status: DC

## 2012-02-10 MED ORDER — TRIAMCINOLONE ACETONIDE 0.1 % EX CREA: TOPICAL_CREAM | Freq: Two times a day (BID) | CUTANEOUS | Status: DC

## 2012-02-10 NOTE — Progress Notes (Signed)
Subjective:   HPI  Amy Casey is a 51 y.o. female who presents for a complete physical.  No recent primary care.  Was going to urgent care prior.   Preventative care: Last ophthalmology visit:10/2011 Last dental visit:n/a Last colonoscopy: never Last mammogram:few years ago Last gynecological exam: 3 years ago Last EKG:yes Last labs:year ago  Prior vaccinations: TD or Tdap:unsure Influenza:no, declines Pneumococcal:n/a Shingles/Zostavax:n/a  Advanced directive:n/a Health care power of attorney:n/a Living will:n/a  Concerns: Gets leg pain in lower legs intermittent, achy, not cramps.  Better with activity, worse at rest.  Denies RLS.    HTN - compliant, but has been on verapamil and amlodipine in the past.   Reviewed their medical, surgical, family, social, medication, and allergy history and updated chart as appropriate.   Past Medical History  Diagnosis Date  . History of renal stone   . Wears glasses   . Hypertension   . Depression     Dr. Mila Homer, [facility]; hospitalization 07/2010  . Obesity   . History of echocardiogram 2011    [facility]    Past Surgical History  Procedure Date  . Wisdom tooth extraction     Family History  Problem Relation Age of Onset  . Diabetes Mother   . Kidney disease Mother     dialysis  . Heart disease Mother   . Cancer Maternal Uncle     lung  . Stroke Neg Hx   . Hypertension Neg Hx   . Hyperlipidemia Neg Hx     History   Social History  . Marital Status: Married    Spouse Name: N/A    Number of Children: N/A  . Years of Education: N/A   Occupational History  . Not on file.   Social History Main Topics  . Smoking status: Never Smoker   . Smokeless tobacco: Not on file  . Alcohol Use: No  . Drug Use: No  . Sexually Active: Not on file   Other Topics Concern  . 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 ARIPiprazole (ABILIFY) 20 MG tablet Take 20 mg by mouth daily.      Marland Kitchen FLUoxetine (PROZAC) 20 MG tablet Take 20 mg by mouth daily.      . potassium chloride (K-DUR) 10 MEQ tablet Take 10 mEq by mouth 2 (two) times daily.      Marland Kitchen lisinopril-hydrochlorothiazide (ZESTORETIC) 20-12.5 MG per tablet Take 1 tablet by mouth daily.  90 tablet  3    No Known Allergies   Review of Systems Constitutional: -fever, -chills, -sweats, +unexpected weight change, -decreased appetite, -fatigue Allergy: -sneezing, -itching, +congestion Dermatology: -changing moles, --rash, -lumps ENT: -runny nose, -ear pain, -sore throat, -hoarseness, -sinus pain, +teeth pain, - ringing in ears, -hearing loss, -nosebleeds Cardiology: -chest pain, -palpitations, -swelling, -difficulty breathing when lying flat, -waking up short of breath Respiratory: -cough, -shortness of breath, -difficulty breathing with exercise or exertion, -wheezing, -coughing up blood Gastroenterology: -abdominal pain, -nausea, -vomiting, -diarrhea, -constipation, -blood in stool, -changes in bowel movement, -difficulty swallowing or eating Hematology: -bleeding, -bruising  Musculoskeletal: -joint aches, +muscle aches, -joint swelling, -back pain, -neck pain, -cramping, -changes in gait Ophthalmology: denies vision changes, eye redness, itching, discharge Urology: -burning with urination, -difficulty urinating, -blood in urine, +urinary frequency, -urgency, -incontinence Neurology: -headache, -weakness, -tingling, +  numbness, -memory loss, -falls, +dizziness Psychology: -depressed mood, -agitation, +sleep problems     Objective:   Physical Exam  Filed Vitals:   02/10/12 1008  BP: 142/90  Pulse: 60  Temp: 98 F (36.7 C)  Resp: 16    General appearance: alert, no distress, WD/WN, AA female, obese Skin: right volar wrist and base of thumb with rough brown patch of skin,  possible eczema but nonspecific.  Scattered benign appearing macules throughout including right lateral beast, right upper back, no worrisome lesions HEENT: normocephalic, conjunctiva/corneas normal, sclerae anicteric, PERRLA, EOMi, nares patent, no discharge or erythema, pharynx normal Oral cavity: MMM, tongue normal, few teeth with obvious decay Neck: supple, no lymphadenopathy, no thyromegaly, no masses, normal ROM, no bruits Chest: non tender, normal shape and expansion Heart: RRR, normal S1, S2, no murmurs Lungs: CTA bilaterally, no wheezes, rhonchi, or rales Abdomen: +bs, soft, non tender, non distended, no masses, no hepatomegaly, no splenomegaly, no bruits Back: non tender, normal ROM, no scoliosis Musculoskeletal: upper extremities non tender, no obvious deformity, normal ROM throughout, lower extremities non tender, no obvious deformity, normal ROM throughout Extremities: no edema, no cyanosis, no clubbing Pulses: 2+ symmetric, upper and lower extremities, normal cap refill Neurological: alert, oriented x 3, CN2-12 intact, strength normal upper extremities and lower extremities, sensation normal throughout, DTRs 2+ throughout, no cerebellar signs, gait normal Psychiatric: normal affect, behavior normal, pleasant  Breast: nontender, no masses or lumps, no skin changes, no nipple discharge or inversion, no axillary lymphadenopathy Gyn: Normal external genitalia without lesions, vagina with normal mucosa, cervix without lesions, no cervical motion tenderness, no abnormal vaginal discharge.  Uterus and adnexa not enlarged, nontender, no masses.  Pap performed.  Exam chaperoned by nurse. Rectal: deferred    Assessment and Plan :    Encounter Diagnoses  Name Primary?  . Routine general medical examination at a health care facility Yes  . Essential hypertension, benign   . Tooth decay   . Screening for breast cancer   . Screening for cervical cancer   . Need for Tdap vaccination   .  Obesity   . Impaired fasting blood sugar   . Screen for colon cancer   . Rash   . Leg pain, inferior    Physical exam - discussed healthy lifestyle, diet, exercise, preventative care, vaccinations, and addressed their concerns.  Handout given.  Declines flu vaccine.  HTN -  C/t Lisinopril/HCT 20/12.5mg  daily.  Add amlodipine 5mg  daily.  Recheck 4mo.  Will request prior cardiology records.   Tooth decay - f/u with dentist ASAP  Mammogram appt set  Pap sent  tdap vaccine, VIS and counseling given  Obesity - discussed need to work on lifestyle changes  impaired fasting glucose - HgbA1C today  Advised colonoscopy.  She declines for now until after mammogram/labs.  Will plan to do in 2014.  Rash - script for triamcinolone.  Recheck 4mo  Leg pain - etiology unclear.   Follow-up pending lab.

## 2012-02-10 NOTE — Telephone Encounter (Signed)
Message copied by Joslyn Hy on Fri Feb 10, 2012  4:55 PM ------      Message from: Jac Canavan      Created: Fri Feb 10, 2012  1:36 PM       Call her back.   i resent her current medication for BP, but I actually added Amlodipine 5mg  daily.  She has been on this prior.  There is no way to increase the lisinopril without increasing the fluid pill.  I mis spoke not thinking about the dose she was on.   Thus, c/t Lisinopril HCT 20/12.5 daily but add Amlodipine 5mg  daily as well for BP.   recheck 98mo

## 2012-02-10 NOTE — Patient Instructions (Signed)

## 2012-02-10 NOTE — Telephone Encounter (Signed)
Called and left a message on pt vm letting her know to keep same bp now and also take an additional amlodipine 5mg   And to call and make an appt for a recheck in 1 month

## 2012-03-08 ENCOUNTER — Encounter: Payer: Self-pay | Admitting: Internal Medicine

## 2012-03-23 ENCOUNTER — Ambulatory Visit
Admission: RE | Admit: 2012-03-23 | Discharge: 2012-03-23 | Disposition: A | Discharge status: Home / Self Care | Payer: No Typology Code available for payment source | Source: Ambulatory Visit | Attending: Medical | Admitting: Medical

## 2012-03-23 DIAGNOSIS — Z1231 Encounter for screening mammogram for malignant neoplasm of breast: Secondary | ICD-10-CM

## 2012-04-02 ENCOUNTER — Telehealth: Payer: Self-pay | Admitting: Family Medicine

## 2012-04-02 NOTE — Telephone Encounter (Signed)
Patient states that she is having pains in her legs and feet when she drives for a period of time or sitting for a while. She would like to know can she get some medication to help with ? CLS

## 2012-04-03 NOTE — Telephone Encounter (Signed)
Have her return for f/u OV on this to discuss

## 2012-04-03 NOTE — Telephone Encounter (Signed)
I left a message on the patients cell phone making her aware that she will need an OV. CLS

## 2012-04-06 ENCOUNTER — Encounter: Payer: Self-pay | Admitting: Medical

## 2012-04-06 ENCOUNTER — Ambulatory Visit (INDEPENDENT_AMBULATORY_CARE_PROVIDER_SITE_OTHER): Payer: BC Managed Care – PPO | Admitting: Medical

## 2012-04-06 VITALS — BP 150/110 | HR 78 | Temp 98.1°F | Resp 16 | Wt 212.0 lb

## 2012-04-06 DIAGNOSIS — M25549 Pain in joints of unspecified hand: Secondary | ICD-10-CM

## 2012-04-06 DIAGNOSIS — M79604 Pain in right leg: Secondary | ICD-10-CM

## 2012-04-06 DIAGNOSIS — I10 Essential (primary) hypertension: Secondary | ICD-10-CM

## 2012-04-06 DIAGNOSIS — R252 Cramp and spasm: Secondary | ICD-10-CM

## 2012-04-06 DIAGNOSIS — M79609 Pain in unspecified limb: Secondary | ICD-10-CM

## 2012-04-06 DIAGNOSIS — E876 Hypokalemia: Secondary | ICD-10-CM

## 2012-04-06 MED ORDER — MAGNESIUM 100 MG PO TABS: 1.0000 | ORAL_TABLET | Freq: Every day | ORAL | Status: DC

## 2012-04-06 MED ORDER — MELOXICAM 15 MG PO TABS: 15.0000 mg | ORAL_TABLET | Freq: Every day | ORAL | Status: DC

## 2012-04-06 NOTE — Patient Instructions (Addendum)
You should have Kdur or potassium 10 mEq at home.  You are currently using this once daily.  Increase to one tablet twice daily.    Begin Magnesium tablets 100mg  once daily.  Both potassium and magnesium will be used to see if they help the leg pain/cramps.    Make sure you drink at least 6 big glasses of water daily.  Make sure you are taking both of your blood pressure medications every day.    For the hand pain, this sound like arthritis.  i want to start you on a once daily medication called Mobic.  Lets see if this doesn't give you some relief.  I want to see you back in 1 month to recheck your blood pressure and these symptoms.

## 2012-04-06 NOTE — Progress Notes (Signed)
Subjective: Here for leg pains and fingers drawing up.    She reports pains in her legs x 1 year or so.  When she gets up, can barely walk due to pains in lower legs and feet, bilat.  Sometimes pain when sitting.  She does toss and turn when in the bed due to leg pain.  Pain is dull, not sharp.  Denies burning pain.  Once she gets to walking the pain can go away.   Pain is not above her knees.  Pain is anytime, both day and night, but then she says it is worse when sitting.  So her story isn't quite clear. sometimes has to move the legs to get them to calm down.   Saw a doctor in the past for her feet though, and was prescribed a medication that started with a "D."    She notes that she can't close her hands all the way x a few months.  Worse in the morning.  Feels like the finger knuckles swell, especially in the morning.  Denies warmth.   No wrist problem, just in fingers, bilat.    She is taking both her Lisinopril HCT and the new Amlodipine we started last visit but she hasn't taken any of her medications today as she hasn't eaten yet.   Past Medical History  Diagnosis Date  . History of renal stone   . Wears glasses   . Hypertension   . Depression     Dr. Mila Homer, [facility]; hospitalization 07/2010  . Obesity   . History of echocardiogram 2011    [facility]   ROS  Gen: no fever, night sweats Neuro: no numbness, but sometimes tingling in the hands, +weakness, bilat, no numbness in the legs or tingling Back: no pain MSK: sometimes gets left shoulder pains Heart: no CP, palpitations, edema Lungs: negative    Objective: Gen: wd, wn, nad Skin: toenails thickened of toes, no nail pitting or other deformity of fingernails.  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  Wrist and rest of UE unremarkable.  Tender along bilat calves, otherwise LE nontender, no obvious deformity, normal LE ROM.   Pulses normal 2+ UE and LE Cap refil normal Neuro: normal UE and LE strength, sensation and DTRs  Assessment: Encounter Diagnoses  Name Primary?  . Essential hypertension, benign Yes  . Arthralgia of hand   . Leg pain, bilateral   . Leg cramps   . Hypokalemia    Plan: Patient Instructions  You should have Kdur or potassium 10 mEq at home.  You are currently using this once daily.  Increase to one tablet twice daily.    Begin Magnesium tablets 100mg  once daily.  Both potassium and magnesium will be used to see if they help the leg pain/cramps.    Make sure you drink at least 6 big glasses of water daily.  Make sure you are taking both of your blood pressure medications every day.    For the hand pain, this sound like arthritis.  i want to start you on a once daily medication called Mobic.  Lets see  if this doesn't give you some relief.  I want to see you back in 1 month to recheck your blood pressure and these symptoms.

## 2012-05-07 ENCOUNTER — Encounter: Payer: Self-pay | Admitting: Medical

## 2012-05-07 ENCOUNTER — Ambulatory Visit (INDEPENDENT_AMBULATORY_CARE_PROVIDER_SITE_OTHER): Payer: BC Managed Care – PPO | Admitting: Medical

## 2012-05-07 VITALS — BP 150/100 | HR 60 | Temp 98.1°F | Resp 16 | Wt 213.0 lb

## 2012-05-07 DIAGNOSIS — R29898 Other symptoms and signs involving the musculoskeletal system: Secondary | ICD-10-CM

## 2012-05-07 DIAGNOSIS — M79609 Pain in unspecified limb: Secondary | ICD-10-CM

## 2012-05-07 DIAGNOSIS — R252 Cramp and spasm: Secondary | ICD-10-CM

## 2012-05-07 DIAGNOSIS — M25649 Stiffness of unspecified hand, not elsewhere classified: Secondary | ICD-10-CM

## 2012-05-07 DIAGNOSIS — M79641 Pain in right hand: Secondary | ICD-10-CM

## 2012-05-07 DIAGNOSIS — Q849 Congenital malformation of integument, unspecified: Secondary | ICD-10-CM

## 2012-05-07 DIAGNOSIS — E876 Hypokalemia: Secondary | ICD-10-CM

## 2012-05-07 DIAGNOSIS — Q829 Congenital malformation of skin, unspecified: Secondary | ICD-10-CM

## 2012-05-07 DIAGNOSIS — I10 Essential (primary) hypertension: Secondary | ICD-10-CM

## 2012-05-07 LAB — BASIC METABOLIC PANEL
BUN: 20 mg/dL (ref 6–23)
CO2: 29 mEq/L (ref 19–32)
Calcium: 9.5 mg/dL (ref 8.4–10.5)
Chloride: 103 mEq/L (ref 96–112)
Creat: 0.83 mg/dL (ref 0.50–1.10)
Glucose, Bld: 101 mg/dL — ABNORMAL HIGH (ref 70–99)
Potassium: 3.2 mEq/L — ABNORMAL LOW (ref 3.5–5.3)
Sodium: 142 mEq/L (ref 135–145)

## 2012-05-07 LAB — URIC ACID: Uric Acid, Serum: 5.7 mg/dL (ref 2.4–7.0)

## 2012-05-07 MED ORDER — DICLOFENAC SODIUM 1 % TD GEL: 2.0000 g | Freq: Four times a day (QID) | TRANSDERMAL | Status: DC

## 2012-05-08 ENCOUNTER — Encounter: Payer: Self-pay | Admitting: Medical

## 2012-05-08 ENCOUNTER — Other Ambulatory Visit: Payer: Self-pay | Admitting: Medical

## 2012-05-08 MED ORDER — POTASSIUM CHLORIDE ER 10 MEQ PO TBCR: 10.0000 meq | EXTENDED_RELEASE_TABLET | Freq: Two times a day (BID) | ORAL | Status: DC

## 2012-05-08 MED ORDER — AMLODIPINE BESYLATE 5 MG PO TABS: 5.0000 mg | ORAL_TABLET | Freq: Every day | ORAL | Status: DC

## 2012-05-08 MED ORDER — POTASSIUM CHLORIDE CRYS ER 20 MEQ PO TBCR: 20.0000 meq | EXTENDED_RELEASE_TABLET | Freq: Two times a day (BID) | ORAL | Status: DC

## 2012-05-08 NOTE — Progress Notes (Signed)
Subjective: Here for recheck on multiple issues.    Last visit we discussed her finger pain and decreased ROM.  She can't close her hands all the way x a few months.  Worse in the morning.  Feels like the finger knuckles swell, especially in the morning.  Denies warmth.   No wrist problem, just in fingers, bilat.  No improvement with Mobic.  The leg cramps and pains are much better after beginning either the magnesium and kdur or this plus the Mobic but she is not sure.    She is compliant with her lisinopril, but lost her norvasc, been out of this 3 weeks.  She has not symptoms today.  Past Medical History  Diagnosis Date  . History of renal stone   . Wears glasses   . Hypertension   . Depression     Dr. Mila Homer, [facility]; hospitalization 07/2010  . Obesity   . 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  Wrist and rest of UE unremarkable.   Pulses normal 2+ UE and LE Cap refill normal Neuro: normal UE and LE strength, sensation and DTRs  Assessment: Encounter Diagnoses  Name Primary?  . Bilateral hand pain Yes  . Decreased range of motion of finger   . Essential  hypertension, benign   . Hypokalemia   . Leg cramps   . Skin anomaly    Plan: Bilateral hand pain, decreased ROM fingers - uric acid lab and xrays today, still suspect OA.  No improvement on Mobic so stop this, but begin trial of Diclofenac gel topically  HTN - advise she not run out of her medications.   She has been out of Norvasc because she lost the bottle.  Advised she go get refill at pharmacy, c/t both mediations daily.    Hypokalemia - compliant with Kdur, BMET lab today  Leg cramps - much improved on OTC Mg and Kdur  Skin anomaly - refer to dermatology

## 2012-08-30 ENCOUNTER — Other Ambulatory Visit: Payer: Self-pay | Admitting: Medical

## 2012-08-30 MED ORDER — NAPROXEN 375 MG PO TABS: 375.0000 mg | ORAL_TABLET | Freq: Two times a day (BID) | ORAL | Status: DC

## 2012-08-31 ENCOUNTER — Telehealth: Payer: Self-pay | Admitting: Family Medicine

## 2012-08-31 NOTE — Telephone Encounter (Signed)
Message copied by Janeice Robinson on Fri Aug 31, 2012  1:27 PM ------      Message from: Jac Canavan      Created: Thu Aug 30, 2012  8:24 AM       i sent naprosyn she can use 1-2 times daily for arthritis pain in fingers.  Also, please have her get the hand xrays done.  They are still pending in epic.      ----- Message -----         From: Jac Canavan, PA-C         Sent: 08/24/2012   4:16 PM           To: Jac Canavan, PA-C            Joint pains       ------

## 2012-08-31 NOTE — Telephone Encounter (Signed)
Patient is aware of the medication and the xrays. CLS

## 2012-09-04 ENCOUNTER — Ambulatory Visit
Admission: RE | Admit: 2012-09-04 | Discharge: 2012-09-04 | Disposition: A | Discharge status: Home / Self Care | Payer: BC Managed Care – PPO | Source: Ambulatory Visit | Attending: Medical | Admitting: Medical

## 2012-09-04 DIAGNOSIS — M25649 Stiffness of unspecified hand, not elsewhere classified: Secondary | ICD-10-CM

## 2012-09-04 DIAGNOSIS — M79641 Pain in right hand: Secondary | ICD-10-CM

## 2012-09-04 DIAGNOSIS — M79642 Pain in left hand: Secondary | ICD-10-CM

## 2013-05-10 ENCOUNTER — Other Ambulatory Visit: Payer: Self-pay | Admitting: Medical

## 2013-05-10 ENCOUNTER — Telehealth: Payer: Self-pay | Admitting: Medical

## 2013-05-10 MED ORDER — LISINOPRIL-HYDROCHLOROTHIAZIDE 20-12.5 MG PO TABS: 1.0000 | ORAL_TABLET | Freq: Every day | ORAL | Status: DC

## 2013-05-10 MED ORDER — AMLODIPINE BESYLATE 5 MG PO TABS: 5.0000 mg | ORAL_TABLET | Freq: Every day | ORAL | Status: DC

## 2013-05-10 NOTE — Telephone Encounter (Signed)
I sent refills on both BP meds, restart and f/u in about 3-4 wk before the 30 day supply is up.  This is a routine f/u, fasting.  Her last visit was right at a year ago.

## 2014-02-19 ENCOUNTER — Emergency Department (HOSPITAL_COMMUNITY): Payer: BC Managed Care – PPO

## 2014-02-19 ENCOUNTER — Encounter (HOSPITAL_COMMUNITY): Payer: Self-pay | Admitting: *Deleted

## 2014-02-19 ENCOUNTER — Inpatient Hospital Stay (HOSPITAL_COMMUNITY)
Admission: EM | Admit: 2014-02-19 | Discharge: 2014-02-23 | DRG: 091 | Disposition: A | Discharge status: Home / Self Care | Payer: BC Managed Care – PPO | Attending: Internal Medicine | Admitting: Internal Medicine

## 2014-02-19 DIAGNOSIS — G92 Toxic encephalopathy: Secondary | ICD-10-CM | POA: Diagnosis present

## 2014-02-19 DIAGNOSIS — R739 Hyperglycemia, unspecified: Secondary | ICD-10-CM | POA: Diagnosis present

## 2014-02-19 DIAGNOSIS — J9600 Acute respiratory failure, unspecified whether with hypoxia or hypercapnia: Secondary | ICD-10-CM

## 2014-02-19 DIAGNOSIS — F329 Major depressive disorder, single episode, unspecified: Secondary | ICD-10-CM | POA: Diagnosis present

## 2014-02-19 DIAGNOSIS — E870 Hyperosmolality and hypernatremia: Secondary | ICD-10-CM | POA: Diagnosis present

## 2014-02-19 DIAGNOSIS — J9602 Acute respiratory failure with hypercapnia: Secondary | ICD-10-CM | POA: Diagnosis present

## 2014-02-19 DIAGNOSIS — F209 Schizophrenia, unspecified: Secondary | ICD-10-CM | POA: Diagnosis present

## 2014-02-19 DIAGNOSIS — T8579XA Infection and inflammatory reaction due to other internal prosthetic devices, implants and grafts, initial encounter: Secondary | ICD-10-CM

## 2014-02-19 DIAGNOSIS — R404 Transient alteration of awareness: Secondary | ICD-10-CM

## 2014-02-19 DIAGNOSIS — E873 Alkalosis: Secondary | ICD-10-CM | POA: Diagnosis present

## 2014-02-19 DIAGNOSIS — M6282 Rhabdomyolysis: Secondary | ICD-10-CM | POA: Diagnosis present

## 2014-02-19 DIAGNOSIS — I4581 Long QT syndrome: Secondary | ICD-10-CM | POA: Diagnosis present

## 2014-02-19 DIAGNOSIS — R740 Nonspecific elevation of levels of transaminase and lactic acid dehydrogenase [LDH]: Secondary | ICD-10-CM | POA: Diagnosis present

## 2014-02-19 DIAGNOSIS — E876 Hypokalemia: Secondary | ICD-10-CM | POA: Diagnosis present

## 2014-02-19 DIAGNOSIS — N179 Acute kidney failure, unspecified: Secondary | ICD-10-CM | POA: Diagnosis present

## 2014-02-19 DIAGNOSIS — J9601 Acute respiratory failure with hypoxia: Secondary | ICD-10-CM | POA: Diagnosis present

## 2014-02-19 DIAGNOSIS — Z6827 Body mass index (BMI) 27.0-27.9, adult: Secondary | ICD-10-CM

## 2014-02-19 DIAGNOSIS — E669 Obesity, unspecified: Secondary | ICD-10-CM | POA: Diagnosis present

## 2014-02-19 DIAGNOSIS — E46 Unspecified protein-calorie malnutrition: Secondary | ICD-10-CM | POA: Diagnosis present

## 2014-02-19 DIAGNOSIS — E872 Acidosis: Secondary | ICD-10-CM | POA: Diagnosis present

## 2014-02-19 DIAGNOSIS — R6510 Systemic inflammatory response syndrome (SIRS) of non-infectious origin without acute organ dysfunction: Secondary | ICD-10-CM | POA: Diagnosis present

## 2014-02-19 DIAGNOSIS — R4182 Altered mental status, unspecified: Secondary | ICD-10-CM | POA: Diagnosis present

## 2014-02-19 DIAGNOSIS — Z4659 Encounter for fitting and adjustment of other gastrointestinal appliance and device: Secondary | ICD-10-CM

## 2014-02-19 DIAGNOSIS — I10 Essential (primary) hypertension: Secondary | ICD-10-CM | POA: Diagnosis present

## 2014-02-19 DIAGNOSIS — R4189 Other symptoms and signs involving cognitive functions and awareness: Secondary | ICD-10-CM

## 2014-02-19 LAB — I-STAT CHEM 8, ED
BUN: 76 mg/dL — ABNORMAL HIGH (ref 6–23)
Calcium, Ion: 1.1 mmol/L — ABNORMAL LOW (ref 1.12–1.23)
Chloride: 106 mEq/L (ref 96–112)
Creatinine, Ser: 2.9 mg/dL — ABNORMAL HIGH (ref 0.50–1.10)
Glucose, Bld: 105 mg/dL — ABNORMAL HIGH (ref 70–99)
HCT: 47 % — ABNORMAL HIGH (ref 36.0–46.0)
Hemoglobin: 16 g/dL — ABNORMAL HIGH (ref 12.0–15.0)
Potassium: 3.4 mEq/L — ABNORMAL LOW (ref 3.7–5.3)
Sodium: 153 mEq/L — ABNORMAL HIGH (ref 137–147)
TCO2: 29 mmol/L (ref 0–100)

## 2014-02-19 LAB — PROTEIN AND GLUCOSE, CSF
Glucose, CSF: 79 mg/dL — ABNORMAL HIGH (ref 43–76)
Total  Protein, CSF: 34 mg/dL (ref 15–45)

## 2014-02-19 LAB — CBC WITH DIFFERENTIAL/PLATELET
Basophils Absolute: 0 10*3/uL (ref 0.0–0.1)
Basophils Relative: 0 % (ref 0–1)
Eosinophils Absolute: 0 10*3/uL (ref 0.0–0.7)
Eosinophils Relative: 0 % (ref 0–5)
HCT: 44.2 % (ref 36.0–46.0)
Hemoglobin: 14 g/dL (ref 12.0–15.0)
Lymphocytes Relative: 10 % — ABNORMAL LOW (ref 12–46)
Lymphs Abs: 1.3 10*3/uL (ref 0.7–4.0)
MCH: 28.1 pg (ref 26.0–34.0)
MCHC: 31.7 g/dL (ref 30.0–36.0)
MCV: 88.8 fL (ref 78.0–100.0)
Monocytes Absolute: 0.6 10*3/uL (ref 0.1–1.0)
Monocytes Relative: 4 % (ref 3–12)
Neutro Abs: 11 10*3/uL — ABNORMAL HIGH (ref 1.7–7.7)
Neutrophils Relative %: 86 % — ABNORMAL HIGH (ref 43–77)
Platelets: 332 10*3/uL (ref 150–400)
RBC: 4.98 MIL/uL (ref 3.87–5.11)
RDW: 13.1 % (ref 11.5–15.5)
WBC: 12.9 10*3/uL — ABNORMAL HIGH (ref 4.0–10.5)

## 2014-02-19 LAB — GRAM STAIN

## 2014-02-19 LAB — COMPREHENSIVE METABOLIC PANEL
ALT: 44 U/L — ABNORMAL HIGH (ref 0–35)
AST: 85 U/L — ABNORMAL HIGH (ref 0–37)
Albumin: 4.4 g/dL (ref 3.5–5.2)
Alkaline Phosphatase: 54 U/L (ref 39–117)
Anion gap: 19 — ABNORMAL HIGH (ref 5–15)
BUN: 73 mg/dL — ABNORMAL HIGH (ref 6–23)
CO2: 28 mEq/L (ref 19–32)
Calcium: 9.6 mg/dL (ref 8.4–10.5)
Chloride: 106 mEq/L (ref 96–112)
Creatinine, Ser: 2.64 mg/dL — ABNORMAL HIGH (ref 0.50–1.10)
GFR calc Af Amer: 22 mL/min — ABNORMAL LOW (ref >90)
GFR calc non Af Amer: 19 mL/min — ABNORMAL LOW (ref >90)
Glucose, Bld: 94 mg/dL (ref 70–99)
Potassium: 3.1 mEq/L — ABNORMAL LOW (ref 3.7–5.3)
Sodium: 153 mEq/L — ABNORMAL HIGH (ref 137–147)
Total Bilirubin: 0.8 mg/dL (ref 0.3–1.2)
Total Protein: 7.9 g/dL (ref 6.0–8.3)

## 2014-02-19 LAB — I-STAT ARTERIAL BLOOD GAS, ED
Acid-Base Excess: 7 mmol/L — ABNORMAL HIGH (ref 0.0–2.0)
Acid-Base Excess: 7 mmol/L — ABNORMAL HIGH (ref 0.0–2.0)
Bicarbonate: 33.1 mEq/L — ABNORMAL HIGH (ref 20.0–24.0)
Bicarbonate: 29.5 mEq/L — ABNORMAL HIGH (ref 20.0–24.0)
O2 Saturation: 94 %
O2 Saturation: 100 %
Patient temperature: 99.4
TCO2: 35 mmol/L (ref 0–100)
TCO2: 31 mmol/L (ref 0–100)
pCO2 arterial: 49.4 mmHg — ABNORMAL HIGH (ref 35.0–45.0)
pCO2 arterial: 35.6 mmHg (ref 35.0–45.0)
pH, Arterial: 7.434 (ref 7.350–7.450)
pH, Arterial: 7.529 — ABNORMAL HIGH (ref 7.350–7.450)
pO2, Arterial: 70 mmHg — ABNORMAL LOW (ref 80.0–100.0)
pO2, Arterial: 463 mmHg — ABNORMAL HIGH (ref 80.0–100.0)

## 2014-02-19 LAB — URINALYSIS, ROUTINE W REFLEX MICROSCOPIC
Glucose, UA: NEGATIVE mg/dL
Ketones, ur: 15 mg/dL — AB
Leukocytes, UA: NEGATIVE
Nitrite: NEGATIVE
Protein, ur: NEGATIVE mg/dL
Specific Gravity, Urine: 1.024 (ref 1.005–1.030)
Urobilinogen, UA: 1 mg/dL (ref 0.0–1.0)
pH: 5 (ref 5.0–8.0)

## 2014-02-19 LAB — RAPID URINE DRUG SCREEN, HOSP PERFORMED
Amphetamines: NOT DETECTED
Barbiturates: NOT DETECTED
Benzodiazepines: POSITIVE — AB
Cocaine: NOT DETECTED
Opiates: NOT DETECTED
Tetrahydrocannabinol: NOT DETECTED

## 2014-02-19 LAB — URINE MICROSCOPIC-ADD ON

## 2014-02-19 LAB — SALICYLATE LEVEL: Salicylate Lvl: <2 mg/dL — ABNORMAL LOW (ref 2.8–20.0)

## 2014-02-19 LAB — PREGNANCY, URINE: Preg Test, Ur: NEGATIVE

## 2014-02-19 LAB — ACETAMINOPHEN LEVEL: Acetaminophen (Tylenol), Serum: <15 ug/mL (ref 10–30)

## 2014-02-19 LAB — ETHANOL: Alcohol, Ethyl (B): <11 mg/dL (ref 0–11)

## 2014-02-19 LAB — I-STAT CG4 LACTIC ACID, ED: Lactic Acid, Venous: 2.64 mmol/L — ABNORMAL HIGH (ref 0.5–2.2)

## 2014-02-19 LAB — CBG MONITORING, ED: Glucose-Capillary: 88 mg/dL (ref 70–99)

## 2014-02-19 LAB — CK: Total CK: 3465 U/L — ABNORMAL HIGH (ref 7–177)

## 2014-02-19 MED ORDER — ROCURONIUM BROMIDE 50 MG/5ML IV SOLN
INTRAVENOUS | Status: AC
  Filled 2014-02-19: qty 2

## 2014-02-19 MED ORDER — SUCCINYLCHOLINE CHLORIDE 20 MG/ML IJ SOLN
INTRAMUSCULAR | Status: AC
  Filled 2014-02-19: qty 1

## 2014-02-19 MED ORDER — HEPARIN SODIUM (PORCINE) 5000 UNIT/ML IJ SOLN
5000.0000 [IU] | Freq: Three times a day (TID) | INTRAMUSCULAR | Status: DC
  Administered 2014-02-20 (×2): 5000 [IU] via SUBCUTANEOUS
  Filled 2014-02-19 (×5): qty 1

## 2014-02-19 MED ORDER — SODIUM CHLORIDE 0.9 % IV SOLN: 250.0000 mL | INTRAVENOUS | Status: DC | PRN

## 2014-02-19 MED ORDER — DEXTROSE 5 % IV SOLN
600.0000 mg | INTRAVENOUS | Status: DC
  Administered 2014-02-20: 600 mg via INTRAVENOUS
  Filled 2014-02-19 (×2): qty 12

## 2014-02-19 MED ORDER — SODIUM CHLORIDE 0.9 % IV BOLUS (SEPSIS)
1000.0000 mL | Freq: Once | INTRAVENOUS | Status: AC
  Administered 2014-02-19: 1000 mL via INTRAVENOUS

## 2014-02-19 MED ORDER — VANCOMYCIN HCL IN DEXTROSE 750-5 MG/150ML-% IV SOLN
750.0000 mg | INTRAVENOUS | Status: DC
  Filled 2014-02-19: qty 150

## 2014-02-19 MED ORDER — PROPOFOL 10 MG/ML IV EMUL
INTRAVENOUS | Status: AC
  Filled 2014-02-19: qty 100

## 2014-02-19 MED ORDER — PANTOPRAZOLE SODIUM 40 MG IV SOLR
40.0000 mg | Freq: Every day | INTRAVENOUS | Status: DC
  Administered 2014-02-20: 40 mg via INTRAVENOUS
  Filled 2014-02-19 (×2): qty 40

## 2014-02-19 MED ORDER — PROPOFOL 10 MG/ML IV EMUL
5.0000 ug/kg/min | Freq: Once | INTRAVENOUS | Status: DC
  Administered 2014-02-19: 5 ug/kg/min via INTRAVENOUS

## 2014-02-19 MED ORDER — SODIUM CHLORIDE 0.9 % IV SOLN
INTRAVENOUS | Status: DC
  Administered 2014-02-20: 02:00:00 via INTRAVENOUS

## 2014-02-19 MED ORDER — ETOMIDATE 2 MG/ML IV SOLN
INTRAVENOUS | Status: AC | PRN
  Administered 2014-02-19: 20 mg via INTRAVENOUS

## 2014-02-19 MED ORDER — VANCOMYCIN HCL 10 G IV SOLR
2000.0000 mg | INTRAVENOUS | Status: DC
  Filled 2014-02-19: qty 2000

## 2014-02-19 MED ORDER — NALOXONE HCL 0.4 MG/ML IJ SOLN
INTRAMUSCULAR | Status: AC
  Administered 2014-02-19: 0.4 mg
  Filled 2014-02-19: qty 1

## 2014-02-19 MED ORDER — PIPERACILLIN-TAZOBACTAM 3.375 G IVPB 30 MIN: 3.3750 g | Freq: Once | INTRAVENOUS | Status: DC

## 2014-02-19 MED ORDER — LIDOCAINE HCL (CARDIAC) 20 MG/ML IV SOLN
INTRAVENOUS | Status: AC
  Filled 2014-02-19: qty 5

## 2014-02-19 MED ORDER — ETOMIDATE 2 MG/ML IV SOLN
INTRAVENOUS | Status: AC
  Filled 2014-02-19: qty 20

## 2014-02-19 MED ORDER — CEFTRIAXONE SODIUM 2 G IJ SOLR
2.0000 g | Freq: Two times a day (BID) | INTRAMUSCULAR | Status: DC
  Administered 2014-02-19: 2 g via INTRAVENOUS
  Filled 2014-02-19 (×3): qty 2

## 2014-02-19 MED ORDER — ROCURONIUM BROMIDE 50 MG/5ML IV SOLN
INTRAVENOUS | Status: AC | PRN
  Administered 2014-02-19: 100 mg via INTRAVENOUS

## 2014-02-19 MED ORDER — SODIUM CHLORIDE 0.9 % IV SOLN
1.0000 g | Freq: Three times a day (TID) | INTRAVENOUS | Status: DC
  Administered 2014-02-19 – 2014-02-20 (×2): 1 g via INTRAVENOUS
  Filled 2014-02-19 (×4): qty 1000

## 2014-02-19 MED ORDER — VANCOMYCIN HCL IN DEXTROSE 1-5 GM/200ML-% IV SOLN
1000.0000 mg | Freq: Once | INTRAVENOUS | Status: AC
  Administered 2014-02-20: 1000 mg via INTRAVENOUS
  Filled 2014-02-19: qty 200

## 2014-02-19 NOTE — Progress Notes (Signed)
Vt decreased to 8cc (490)setting, RR decreased to 12 and FiO2 decreased to 40% per protocol and ABG results.

## 2014-02-19 NOTE — ED Notes (Addendum)
Per family, pt is schizophrenic and off her psych meds. Reports dealing with depression, had a fall recently and complains of pain to left shoulder. Pt has very flat affect at triage, responsive with stimuli only. Per family, pt has been altered x 24 hours, unsure if pt took something.

## 2014-02-19 NOTE — Progress Notes (Signed)
ANTIBIOTIC CONSULT NOTE - INITIAL  Pharmacy Consult for vancomycin, ampicillin, ceftriaxone, acyclovir Indication: r/o meningitis, encephalitis  No Known Allergies  Patient Measurements: Height: 5\' 7"  (170.2 cm) IBW/kg (Calculated) : 61.6 Adjusted Body Weight:   Vital Signs: Temp: 99.5 F (37.5 C) (11/25 2102) Temp Source: Rectal (11/25 2102) BP: 151/98 mmHg (11/25 2206) Pulse Rate: 103 (11/25 2206) Intake/Output from previous day:   Intake/Output from this shift: Total I/O In: 1000 [I.V.:1000] Out: -   Labs:  Recent Labs  02/19/14 1858 02/19/14 1916  WBC 12.9*  --   HGB 14.0 16.0*  PLT 332  --   CREATININE 2.64* 2.90*   CrCl cannot be calculated (Unknown ideal weight.). No results for input(s): VANCOTROUGH, VANCOPEAK, VANCORANDOM, GENTTROUGH, GENTPEAK, GENTRANDOM, TOBRATROUGH, TOBRAPEAK, TOBRARND, AMIKACINPEAK, AMIKACINTROU, AMIKACIN in the last 72 hours.   Microbiology: No results found for this or any previous visit (from the past 720 hour(s)).  Medical History: Past Medical History  Diagnosis Date  . History of renal stone   . Wears glasses   . Hypertension   . Depression     Dr. Mila HomerSena, [facility]; hospitalization 07/2010  . Obesity   . History of echocardiogram 2011    [facility]    Medications:  Anti-infectives    Start     Dose/Rate Route Frequency Ordered Stop   02/20/14 2300  vancomycin (VANCOCIN) IVPB 750 mg/150 ml premix     750 mg150 mL/hr over 60 Minutes Intravenous Every 24 hours 02/19/14 2226     02/19/14 2300  acyclovir (ZOVIRAX) 600 mg in dextrose 5 % 100 mL IVPB     600 mg112 mL/hr over 60 Minutes Intravenous Every 24 hours 02/19/14 2226     02/19/14 2230  vancomycin (VANCOCIN) IVPB 1000 mg/200 mL premix     1,000 mg200 mL/hr over 60 Minutes Intravenous  Once 02/19/14 2220     02/19/14 2230  ampicillin (OMNIPEN) 1 g in sodium chloride 0.9 % 50 mL IVPB     1 g150 mL/hr over 20 Minutes Intravenous Every 8 hours 02/19/14 2226     02/19/14  2215  vancomycin (VANCOCIN) 2,000 mg in sodium chloride 0.9 % 500 mL IVPB  Status:  Discontinued     2,000 mg250 mL/hr over 120 Minutes Intravenous STAT 02/19/14 2209 02/19/14 2216   02/19/14 2215  piperacillin-tazobactam (ZOSYN) IVPB 3.375 g  Status:  Discontinued     3.375 g100 mL/hr over 30 Minutes Intravenous  Once 02/19/14 2209 02/19/14 2212   02/19/14 2215  cefTRIAXone (ROCEPHIN) 2 g in dextrose 5 % 50 mL IVPB     2 g100 mL/hr over 30 Minutes Intravenous Every 12 hours 02/19/14 2213       Assessment: 53 yof presented to the ED unresponsive requiring intubation. Pt with Tmax of 101.6 and WBC 12.9. Scr is elevated at 2.9. Pt is unsure of weight but she has had recent weight loss and they are guessing around 150lbs. To start empiric broad spectrum antibiotics for possible meningitis and/or sepsis coverage.   Vanc 11/25>> Amp 11/25>> CTX 11/25>> Acyclovir 11/25>>  Goal of Therapy:  Vancomycin trough level 15-20 mcg/ml  Plan:  1. CTX 2gm IV Q12H 2. Ampicillin 1gm IV Q8H 3. Acyclovir 600mg  (~10mg /kg) IV Q24H 4. Vancomycin 1gm IV x 1 then 750mg  IV Q24H 5. F/u renal fxn, C&S, clinical stats and trough at SS 6. F/u ability to de-escalate  7. F/u inpatient measured weight and adjust doses if necessary  Lora Glomski, Drake Leachachel Lynn 02/19/2014,10:26 PM

## 2014-02-19 NOTE — Progress Notes (Signed)
10:21 PM 02/19/2014: High bp schizo patient. To ER with 2 week of decline with low po and fatigue. Then 12-24h altered mental status with possible fall. Unresponsive in PM. In ER GCS 7-9, febrile, looked dry -> intubated. CXR clear, CSF pending, lactate 2.6, Creat 2.6, CK 3500. PCCM asked to admit   Plan PA to bedside   Dr. Kalman ShanMurali Mohid Furuya, M.D., [facility]P Pulmonary and Critical Care Medicine Staff Physician [facility] Pager: [phone], If no answer or between  15:00h - 7:00h: call [phone]  02/19/2014 10:24 PM

## 2014-02-19 NOTE — Progress Notes (Signed)
Pt transported from ED Trauma (C) to CT-3 and back without any complication.

## 2014-02-19 NOTE — ED Provider Notes (Signed)
CSN: [id number]     Arrival date & time 02/19/14  1820 History   First MD Initiated Contact with Patient 02/19/14 1841     Chief Complaint  Patient presents with  . Altered Mental Status  . Fall  . Medical Clearance     (Consider location/radiation/quality/duration/timing/severity/associated sxs/prior Treatment) Patient is a 53 y.o. female presenting with altered mental status.  Altered Mental Status Presenting symptoms: unresponsiveness   Severity:  Severe Most recent episode:  Today Episode history:  Single Timing:  Unable to specify Progression:  Worsening Chronicity:  New Context: not alcohol use, not dementia, not homeless, not a recent change in medication, not a recent illness and not a recent infection     Past Medical History  Diagnosis Date  . History of renal stone   . Wears glasses   . Hypertension   . Depression     Dr. Mila HomerSena, [facility]; hospitalization 07/2010  . Obesity   . [facility] 2011    [facility]   Past Surgical History  Procedure Laterality Date  . Wisdom tooth extraction     Family History  Problem Relation Age of Onset  . Diabetes Mother   . Kidney disease Mother     dialysis  . Heart disease Mother   . Cancer Maternal Uncle     lung  . Stroke Neg Hx   . Hypertension Neg Hx   . Hyperlipidemia Neg Hx    History  Substance Use Topics  . Smoking status: Never Smoker   . Smokeless tobacco: Not on file  . Alcohol Use: No   OB History    No data available     Review of Systems  Unable to perform ROS: Patient unresponsive      Allergies  Review of patient's allergies indicates no known allergies.  Home Medications   Prior to Admission medications   Medication Sig Start Date End Date Taking? Authorizing Provider  amLODipine (NORVASC) 5 MG tablet Take 1 tablet (5 mg total) by mouth daily. 05/10/13   Kermit Baloavid S Tysinger, PA-C  ARIPiprazole (ABILIFY) 20 MG tablet Take 20 mg by mouth daily.    Historical Provider, MD   FLUoxetine (PROZAC) 20 MG tablet Take 20 mg by mouth daily.    Historical Provider, MD  lisinopril-hydrochlorothiazide (ZESTORETIC) 20-12.5 MG per tablet Take 1 tablet by mouth daily. 05/10/13   Kermit Baloavid S Tysinger, PA-C  Magnesium 100 MG TABS Take 1 tablet (100 mg total) by mouth daily. 04/06/12   Kermit Baloavid S Tysinger, PA-C  naproxen (NAPROSYN) 375 MG tablet Take 1 tablet (375 mg total) by mouth 2 (two) times daily with a meal. 08/30/12   Kermit Baloavid S Tysinger, PA-C  potassium chloride SA (K-DUR,KLOR-CON) 20 MEQ tablet Take 1 tablet (20 mEq total) by mouth 2 (two) times daily. 05/08/12   Kermit Baloavid S Tysinger, PA-C  triamcinolone cream (KENALOG) 0.1 % Apply topically 2 (two) times daily. 02/10/12   Kermit Baloavid S Tysinger, PA-C   BP 117/111 mmHg  Pulse 93  Temp(Src) 101.6 F (38.7 C) (Rectal)  Resp 16  SpO2 100% Physical Exam  Constitutional: She appears well-developed and well-nourished.  HENT:  Head: Normocephalic and atraumatic.  Eyes: Conjunctivae and EOM are normal. Pupils are equal, round, and reactive to light.  Cardiovascular: Tachycardia present.   Pulmonary/Chest: Effort normal. No respiratory distress. She has no wheezes.  Abdominal: Soft. She exhibits no distension. There is no tenderness.  Neurological: She is unresponsive. GCS eye subscore is 2. GCS verbal subscore is  1. GCS motor subscore is 4.  Nursing note and vitals reviewed.   ED Course  INTUBATION Date/Time: 02/19/2014 11:40 PM Performed by: Marily MemosMESNER, Corderro Koloski Authorized by: Glynn OctaveANCOUR, STEPHEN Consent: The procedure was performed in an emergent situation. Indications: airway protection Intubation method: direct Patient status: paralyzed (RSI) Sedatives: etomidate Paralytic: rocuronium Laryngoscope size: Miller 3 Tube size: 7.5 mm Tube type: cuffed Number of attempts: 1 Cricoid pressure: no Cords visualized: yes Post-procedure assessment: chest rise and CO2 detector Breath sounds: equal Cuff inflated: yes ETT to lip: 24 cm ETT to  teeth: 23 cm Tube secured with: ETT holder Chest x-ray interpreted by me. Chest x-ray findings: endotracheal tube in appropriate position Patient tolerance: Patient tolerated the procedure well with no immediate complications  LUMBAR PUNCTURE Date/Time: 02/19/2014 11:41 PM Performed by: Marily MemosMESNER, Alexes Lamarque Authorized by: Glynn OctaveANCOUR, STEPHEN Consent: Written consent obtained. Consent given by: spouse Patient understanding: patient states understanding of the procedure being performed Patient consent: the patient's understanding of the procedure matches consent given Procedure consent: procedure consent matches procedure scheduled Relevant documents: relevant documents present and verified Test results: test results available and properly labeled Site marked: the operative site was marked Patient identity confirmed: arm band Time out: Immediately prior to procedure a "time out" was called to verify the correct patient, procedure, equipment, support staff and site/side marked as required. Indications: evaluation for infection and evaluation for altered mental status Lumbar space: L3-L4 interspace Patient's position: right lateral decubitus Needle gauge: 20 Needle length: 3.5 in Number of attempts: 1 Opening pressure: 18 cm H2O Fluid appearance: clear Tubes of fluid: 4 Total volume: 8 ml Post-procedure: site cleaned and adhesive bandage applied Patient tolerance: Patient tolerated the procedure well with no immediate complications   (including critical care time) Labs Review Labs Reviewed  CBC WITH DIFFERENTIAL - Abnormal; Notable for the following:    WBC 12.9 (*)    Neutrophils Relative % 86 (*)    Neutro Abs 11.0 (*)    Lymphocytes Relative 10 (*)    All other components within normal limits  COMPREHENSIVE METABOLIC PANEL - Abnormal; Notable for the following:    Sodium 153 (*)    Potassium 3.1 (*)    BUN 73 (*)    Creatinine, Ser 2.64 (*)    AST 85 (*)    ALT 44 (*)    GFR  calc non Af Amer 19 (*)    GFR calc Af Amer 22 (*)    Anion gap 19 (*)    All other components within normal limits  CK - Abnormal; Notable for the following:    Total CK 3465 (*)    All other components within normal limits  SALICYLATE LEVEL - Abnormal; Notable for the following:    Salicylate Lvl <2.0 (*)    All other components within normal limits  URINALYSIS, ROUTINE W REFLEX MICROSCOPIC - Abnormal; Notable for the following:    Color, Urine AMBER (*)    APPearance CLOUDY (*)    Hgb urine dipstick LARGE (*)    Bilirubin Urine MODERATE (*)    Ketones, ur 15 (*)    All other components within normal limits  URINE RAPID DRUG SCREEN (HOSP PERFORMED) - Abnormal; Notable for the following:    Benzodiazepines POSITIVE (*)    All other components within normal limits  URINE MICROSCOPIC-ADD ON - Abnormal; Notable for the following:    Bacteria, UA MANY (*)    Casts HYALINE CASTS (*)    All other components within normal limits  I-STAT CHEM 8, ED - Abnormal; Notable for the following:    Sodium 153 (*)    Potassium 3.4 (*)    BUN 76 (*)    Creatinine, Ser 2.90 (*)    Glucose, Bld 105 (*)    Calcium, Ion 1.10 (*)    Hemoglobin 16.0 (*)    HCT 47.0 (*)    All other components within normal limits  I-STAT ARTERIAL BLOOD GAS, ED - Abnormal; Notable for the following:    pCO2 arterial 49.4 (*)    pO2, Arterial 70.0 (*)    Bicarbonate 33.1 (*)    Acid-Base Excess 7.0 (*)    All other components within normal limits  I-STAT CG4 LACTIC ACID, ED - Abnormal; Notable for the following:    Lactic Acid, Venous 2.64 (*)    All other components within normal limits  CULTURE, BLOOD (ROUTINE X 2)  CULTURE, BLOOD (ROUTINE X 2)  CSF CULTURE  ETHANOL  ACETAMINOPHEN LEVEL  PREGNANCY, URINE  CSF CELL COUNT WITH DIFFERENTIAL  CSF CELL COUNT WITH DIFFERENTIAL  PROTEIN AND GLUCOSE, CSF  CBG MONITORING, ED    Imaging Review Dg Chest Portable 1 View  02/19/2014   CLINICAL DATA:  Intubation  granuloma.  EXAM: PORTABLE CHEST - 1 VIEW  COMPARISON:  02/19/2014  FINDINGS: Endotracheal tube tip is approximately 4.5 cm above the carina. A nasogastric tube is seen with tip just below the GE junction.  Both lungs are clear. No evidence of pneumothorax or pleural effusion. Heart size within normal limits.  IMPRESSION: No active lung disease.   Electronically Signed   By: Myles Rosenthal M.D.   On: 02/19/2014 19:43   Dg Chest Portable 1 View  02/19/2014   CLINICAL DATA:  Schizophrenia and off psychotic medications. Dealing with depression. Recent fall. Complains of pain to left shoulder.  EXAM: PORTABLE CHEST - 1 VIEW  COMPARISON:  None.  FINDINGS: Heart size appears enlarged. There is calcified plaque noted within the thoracic aorta. Both lungs are clear. The visualized skeletal structures are unremarkable.  IMPRESSION: 1. Cardiac enlargement. 2. No heart failure.   Electronically Signed   By: Signa Kell M.D.   On: 02/19/2014 19:03     EKG Interpretation   Date/Time:  Wednesday February 19 2014 18:42:39 EST Ventricular Rate:  104 PR Interval:  223 QRS Duration: 90 QT Interval:  366 QTC Calculation: 481 R Axis:   4 Text Interpretation:  Sinus tachycardia Ventricular premature complex  Prolonged PR interval Left atrial enlargement Abnormal R-wave progression,  early transition LVH with secondary repolarization abnormality Baseline  wander in lead(s) I II III aVR aVL aVF V6 Artifact needs repeat Confirmed  by Manus Gunning  MD, STEPHEN [phone]) on 02/19/2014 7:00:45 PM      MDM   Final diagnoses:  Intubation granuloma    53 yo F in ED for unresponsiveness, last seen normal this morning. Had a couple weeks of worsening fatigue, not eating well. Husband came home and patient not responding, couldn't walk, possible fall today. Tachycardic, febrile, GCS 7. Initial concern for catatonia, meningitis, encephalitis, sepsis. Labs and imaging relatively unremarkable. CSF obtained. Covered for  meningitis, sepsis unknown source, listeria and viral meningitis. Will admit to ICU.     Marily Memos, MD 02/19/14 [id number]  Glynn Octave, MD 02/20/14 [phone]

## 2014-02-19 NOTE — ED Notes (Signed)
Intensivist at bedside.

## 2014-02-19 NOTE — ED Notes (Signed)
CG-4 reported to Dr. Manus Gunningancour

## 2014-02-19 NOTE — H&P (Signed)
PULMONARY / CRITICAL CARE MEDICINE   Name: Amy Casey MRN: [id number] DOB: 04/20/1960    ADMISSION DATE:  02/19/2014 CONSULTATION DATE:  02/19/2014  REFERRING MD :  Rancour. EDP  CHIEF COMPLAINT: AMS  INITIAL PRESENTATION: altered mental status   STUDIES:  11/25: CT head: no intracranial findings  11/25: CXR>>>no findings  11/25: UDS +benzos   SIGNIFICANT EVENTS: 11/25: Intubated for airway protection    HISTORY OF PRESENT ILLNESS:  History obtained from husband, daughter and sister as patient was sedated on vent.   She has not acting herself for the past couple of weeks. She hasn't been eating or drinking normally. Has had mild URI type symptoms with infrequent cough. Denied any SOB, chest pain, fever, headache, sick contacts or travel. Having regular bowel movements and voiding. Her last normal was observed about two weeks ago.   Her husband left this morning for work and she was communicating with him. Her returned from work at 5:30 pm and she was slurring her speech and weak. No one had communication with her during the day.  They denied any vomiting or incontinence. [facility]   She has a history of schizophrenia but hasn't been seen by her psychiatrist in over a year. She stopped taking her medications about 5-6 months ago. She has had multiple admission to behavioral health. One prior admission for involuntary commit for possible self harm. Family denies that she would cause self harm. She has prescriptions for benztropine, klonopin and Abilify but these are from 2013. Family denies any alcohol, illicit drug use or tobacco.   PAST MEDICAL HISTORY :   has a past medical history of History of renal stone; Wears glasses; Hypertension; Depression; Obesity; and History of echocardiogram (2011).  has past surgical history that includes Wisdom tooth extraction. Prior to Admission medications   Medication Sig Start Date End Date Taking? Authorizing Provider   amLODipine (NORVASC) 5 MG tablet Take 1 tablet (5 mg total) by mouth daily. 05/10/13   Kermit Baloavid S Tysinger, PA-C  ARIPiprazole (ABILIFY) 20 MG tablet Take 20 mg by mouth daily.    Historical Provider, MD  FLUoxetine (PROZAC) 20 MG tablet Take 20 mg by mouth daily.    Historical Provider, MD  lisinopril-hydrochlorothiazide (ZESTORETIC) 20-12.5 MG per tablet Take 1 tablet by mouth daily. 05/10/13   Kermit Baloavid S Tysinger, PA-C  Magnesium 100 MG TABS Take 1 tablet (100 mg total) by mouth daily. 04/06/12   Kermit Baloavid S Tysinger, PA-C  naproxen (NAPROSYN) 375 MG tablet Take 1 tablet (375 mg total) by mouth 2 (two) times daily with a meal. 08/30/12   Kermit Baloavid S Tysinger, PA-C  potassium chloride SA (K-DUR,KLOR-CON) 20 MEQ tablet Take 1 tablet (20 mEq total) by mouth 2 (two) times daily. 05/08/12   Kermit Baloavid S Tysinger, PA-C  triamcinolone cream (KENALOG) 0.1 % Apply topically 2 (two) times daily. 02/10/12   Kermit Baloavid S Tysinger, PA-C   No Known Allergies  FAMILY HISTORY:  indicated that her mother is alive. She indicated that her father is alive. She indicated that her sister is alive. She indicated that her daughter is alive.  SOCIAL HISTORY:  reports that she has never smoked. She does not have any smokeless tobacco history on file. She reports that she does not drink alcohol or use illicit drugs.  REVIEW OF SYSTEMS:  Unable to obtain as patient sedated.   VITAL SIGNS: Temp:  [98.3 F (36.8 C)-101.6 F (38.7 C)] 99.5 F (37.5 C) (11/25 2102) Pulse Rate:  [  91-117] 103 (11/25 2206) Resp:  [12-18] 12 (11/25 2206) BP: (96-189)/(68-118) 151/98 mmHg (11/25 2206) SpO2:  [96 %-100 %] 100 % (11/25 2206) FiO2 (%):  [40 %-100 %] 40 % (11/25 2206) HEMODYNAMICS:   VENTILATOR SETTINGS: Vent Mode:  [-] PRVC FiO2 (%):  [40 %-100 %] 40 % Set Rate:  [12 bmp-16 bmp] 12 bmp Vt Set:  [490 mL-500 mL] 490 mL PEEP:  [5 cmH20] 5 cmH20 Plateau Pressure:  [16 cmH20-17 cmH20] 16 cmH20 INTAKE / OUTPUT:  Intake/Output Summary (Last 24  hours) at 02/19/14 2230 Last data filed at 02/19/14 2054  Gross per 24 hour  Intake   1000 ml  Output      0 ml  Net   1000 ml    PHYSICAL EXAMINATION: General:  Female that appears older than stated age  Neuro:  Neg babinski, no nuchal rigidity, +2 achilles DTR's, withdraws from pain, no spontaneous eye opening. No clonus  HEENT:  TM's clear and intact b/l, pupils small  Cardiovascular:  S1S2, RRR, No M/R/G  Lungs:  CTAB, no wheezing or crackles  Abdomen:  Soft, NTND, hypoactive BS Musculoskeletal:  No gross deformities  Skin:  No rash and intact   LABS:  CBC  Recent Labs Lab 02/19/14 1858 02/19/14 1916  WBC 12.9*  --   HGB 14.0 16.0*  HCT 44.2 47.0*  PLT 332  --    Coag's No results for input(s): APTT, INR in the last 168 hours. BMET  Recent Labs Lab 02/19/14 1858 02/19/14 1916  NA 153* 153*  K 3.1* 3.4*  CL 106 106  CO2 28  --   BUN 73* 76*  CREATININE 2.64* 2.90*  GLUCOSE 94 105*   Electrolytes  Recent Labs Lab 02/19/14 1858  CALCIUM 9.6   Sepsis Markers  Recent Labs Lab 02/19/14 1917  LATICACIDVEN 2.64*   ABG  Recent Labs Lab 02/19/14 1854 02/19/14 2112  PHART 7.434 7.529*  PCO2ART 49.4* 35.6  PO2ART 70.0* 463.0*   Liver Enzymes  Recent Labs Lab 02/19/14 1858  AST 85*  ALT 44*  ALKPHOS 54  BILITOT 0.8  ALBUMIN 4.4   Cardiac Enzymes No results for input(s): TROPONINI, PROBNP in the last 168 hours. Glucose  Recent Labs Lab 02/19/14 1836  GLUCAP 88    Imaging No results found.   ASSESSMENT / PLAN:  PULMONARY OETT 11/25 >>> A: Acute hypercarbic/hypoxemic respiratory failure  P:   Full vent support Trend ABG Follow CXR  CARDIOVASCULAR A: Prolonged QTc 551  Hx of HTN  P:  Holding home medications EKG AM  Avoid QT prolonged medications    RENAL CK: 3465 A:   AKI Hypokalemia Metabolic alkalosis Lactic acidosis, thought to be secondary to dehydration  Rhabdomyolysis from fall, unsure of time down    P:   Repleted K  NS 150 Urine osm  Urine, Na   GASTROINTESTINAL Salicylate: 2.0  Acetaminophen: 15.0  A:  Concern for ingestion Mild transaminitis   P:   Consider TF if continues to be intubated  NPO  Lipase  Amylase  CMP   HEMATOLOGIC A:  No active issues  P:  SubQ heparin   INFECTIOUS A:  SIRS on presentation (tachycardia, febrile)   Leukocytosis  P:   BCx2 11/25 CSF cx 11/25  UC 11/25  Abx: Amp, start date 11/25 ABx: Vanc, start date 11/25  Abx: CTX, start date 11/25  Acyclovir 11/25   Doesn't appear to be meningitis based on preliminary CSF studies   ENDOCRINE  A:  No acute issues   P:   Check TSH   NEUROLOGIC A:  Acute metabolic encephalopathy Unsure if infectious, ingestion, serotonin syndrome, NMS or seizure  Hx of schizophrenic  P:   RASS goal: 0 WUA  B12, folate, ammonia  Consider EEG  Holding home abilify, cogentin, klonopin, and prozac    FAMILY  - Updates: family update at bedside   - Inter-disciplinary family meet or Palliative Care meeting due by:  02/26/14  Myra Rude, MD PGY-2, [facility] 02/20/2014, 12:29 AM   Billy Fischer, MD ; [facility] service Mobile [phone].  After 5:30 PM or weekends, call [phone]

## 2014-02-20 ENCOUNTER — Inpatient Hospital Stay (HOSPITAL_COMMUNITY): Payer: BC Managed Care – PPO

## 2014-02-20 DIAGNOSIS — I4581 Long QT syndrome: Secondary | ICD-10-CM

## 2014-02-20 DIAGNOSIS — J9600 Acute respiratory failure, unspecified whether with hypoxia or hypercapnia: Secondary | ICD-10-CM

## 2014-02-20 DIAGNOSIS — R7989 Other specified abnormal findings of blood chemistry: Secondary | ICD-10-CM

## 2014-02-20 DIAGNOSIS — N179 Acute kidney failure, unspecified: Secondary | ICD-10-CM

## 2014-02-20 LAB — BASIC METABOLIC PANEL
Anion gap: 18 — ABNORMAL HIGH (ref 5–15)
BUN: 54 mg/dL — ABNORMAL HIGH (ref 6–23)
CO2: 22 mEq/L (ref 19–32)
Calcium: 8.1 mg/dL — ABNORMAL LOW (ref 8.4–10.5)
Chloride: 108 mEq/L (ref 96–112)
Creatinine, Ser: 1.69 mg/dL — ABNORMAL HIGH (ref 0.50–1.10)
GFR calc Af Amer: 38 mL/min — ABNORMAL LOW (ref >90)
GFR calc non Af Amer: 33 mL/min — ABNORMAL LOW (ref >90)
Glucose, Bld: 250 mg/dL — ABNORMAL HIGH (ref 70–99)
Potassium: 3.4 mEq/L — ABNORMAL LOW (ref 3.7–5.3)
Sodium: 148 mEq/L — ABNORMAL HIGH (ref 137–147)

## 2014-02-20 LAB — HEPATIC FUNCTION PANEL
ALT: 64 U/L — ABNORMAL HIGH (ref 0–35)
AST: 162 U/L — ABNORMAL HIGH (ref 0–37)
Albumin: 3.7 g/dL (ref 3.5–5.2)
Alkaline Phosphatase: 49 U/L (ref 39–117)
Bilirubin, Direct: <0.2 mg/dL (ref 0.0–0.3)
Total Bilirubin: 0.6 mg/dL (ref 0.3–1.2)
Total Protein: 6.8 g/dL (ref 6.0–8.3)

## 2014-02-20 LAB — CSF CELL COUNT WITH DIFFERENTIAL
RBC Count, CSF: 0 /mm3
RBC Count, CSF: 0 /mm3
Tube #: 1
Tube #: 4
WBC, CSF: 0 /mm3 (ref 0–5)
WBC, CSF: 1 /mm3 (ref 0–5)

## 2014-02-20 LAB — I-STAT ARTERIAL BLOOD GAS, ED
Acid-Base Excess: 1 mmol/L (ref 0.0–2.0)
Bicarbonate: 26.7 mEq/L — ABNORMAL HIGH (ref 20.0–24.0)
O2 Saturation: 99 %
Patient temperature: 98.5
TCO2: 28 mmol/L (ref 0–100)
pCO2 arterial: 43.8 mmHg (ref 35.0–45.0)
pH, Arterial: 7.392 (ref 7.350–7.450)
pO2, Arterial: 167 mmHg — ABNORMAL HIGH (ref 80.0–100.0)

## 2014-02-20 LAB — BLOOD GAS, ARTERIAL
Acid-Base Excess: 2.1 mmol/L — ABNORMAL HIGH (ref 0.0–2.0)
Bicarbonate: 26 mEq/L — ABNORMAL HIGH (ref 20.0–24.0)
Drawn by: 36277
FIO2: 0.4 %
MECHVT: 490 mL
O2 Saturation: 99.2 %
PEEP: 5 cmH2O
Patient temperature: 98.6
RATE: 12 resp/min
TCO2: 27.2 mmol/L (ref 0–100)
pCO2 arterial: 39.3 mmHg (ref 35.0–45.0)
pH, Arterial: 7.435 (ref 7.350–7.450)
pO2, Arterial: 173 mmHg — ABNORMAL HIGH (ref 80.0–100.0)

## 2014-02-20 LAB — GLUCOSE, CAPILLARY
Glucose-Capillary: 100 mg/dL — ABNORMAL HIGH (ref 70–99)
Glucose-Capillary: 99 mg/dL (ref 70–99)
Glucose-Capillary: 104 mg/dL — ABNORMAL HIGH (ref 70–99)
Glucose-Capillary: 88 mg/dL (ref 70–99)

## 2014-02-20 LAB — TRIGLYCERIDES: Triglycerides: 130 mg/dL (ref <150)

## 2014-02-20 LAB — CORTISOL: Cortisol, Plasma: 33.9 ug/dL

## 2014-02-20 LAB — AMYLASE: Amylase: 113 U/L — ABNORMAL HIGH (ref 0–105)

## 2014-02-20 LAB — CBC
HCT: 37.9 % (ref 36.0–46.0)
Hemoglobin: 12.1 g/dL (ref 12.0–15.0)
MCH: 28.2 pg (ref 26.0–34.0)
MCHC: 31.9 g/dL (ref 30.0–36.0)
MCV: 88.3 fL (ref 78.0–100.0)
Platelets: 283 10*3/uL (ref 150–400)
RBC: 4.29 MIL/uL (ref 3.87–5.11)
RDW: 13.3 % (ref 11.5–15.5)
WBC: 10.2 10*3/uL (ref 4.0–10.5)

## 2014-02-20 LAB — OSMOLALITY: Osmolality: 333 mOsm/kg — ABNORMAL HIGH (ref 275–300)

## 2014-02-20 LAB — TSH: TSH: 0.747 u[IU]/mL (ref 0.350–4.500)

## 2014-02-20 LAB — SODIUM, URINE, RANDOM: Sodium, Ur: 125 mEq/L

## 2014-02-20 LAB — MRSA PCR SCREENING: MRSA by PCR: POSITIVE — AB

## 2014-02-20 LAB — AMMONIA: Ammonia: 19 umol/L (ref 11–60)

## 2014-02-20 LAB — MAGNESIUM: Magnesium: 2.5 mg/dL (ref 1.5–2.5)

## 2014-02-20 LAB — RPR

## 2014-02-20 LAB — OSMOLALITY, URINE: Osmolality, Ur: 545 mOsm/kg (ref 390–1090)

## 2014-02-20 LAB — CK: Total CK: 8358 U/L — ABNORMAL HIGH (ref 7–177)

## 2014-02-20 LAB — TROPONIN I: Troponin I: 0.96 ng/mL (ref <0.30)

## 2014-02-20 LAB — VITAMIN B12: Vitamin B-12: 577 pg/mL (ref 211–911)

## 2014-02-20 LAB — LIPASE, BLOOD: Lipase: 45 U/L (ref 11–59)

## 2014-02-20 LAB — CHLORIDE, URINE, RANDOM: Chloride Urine: 102 mEq/L

## 2014-02-20 LAB — PHOSPHORUS: Phosphorus: 3.4 mg/dL (ref 2.3–4.6)

## 2014-02-20 MED ORDER — INFLUENZA VAC SPLIT QUAD 0.5 ML IM SUSY
0.5000 mL | PREFILLED_SYRINGE | INTRAMUSCULAR | Status: DC
  Filled 2014-02-20: qty 0.5

## 2014-02-20 MED ORDER — POTASSIUM CHLORIDE 20 MEQ/15ML (10%) PO SOLN
40.0000 meq | Freq: Once | ORAL | Status: AC
  Administered 2014-02-20: 40 meq via ORAL

## 2014-02-20 MED ORDER — CHLORHEXIDINE GLUCONATE 0.12 % MT SOLN
15.0000 mL | Freq: Two times a day (BID) | OROMUCOSAL | Status: DC
  Administered 2014-02-20 – 2014-02-23 (×7): 15 mL via OROMUCOSAL
  Filled 2014-02-20 (×8): qty 15

## 2014-02-20 MED ORDER — POTASSIUM CHLORIDE 20 MEQ/15ML (10%) PO SOLN
40.0000 meq | Freq: Once | ORAL | Status: DC
  Filled 2014-02-20: qty 30

## 2014-02-20 MED ORDER — ENOXAPARIN SODIUM 40 MG/0.4ML ~~LOC~~ SOLN
40.0000 mg | SUBCUTANEOUS | Status: DC
  Administered 2014-02-20 – 2014-02-22 (×3): 40 mg via SUBCUTANEOUS
  Filled 2014-02-20 (×4): qty 0.4

## 2014-02-20 MED ORDER — POTASSIUM CHLORIDE 10 MEQ/50ML IV SOLN
10.0000 meq | INTRAVENOUS | Status: DC
  Filled 2014-02-20: qty 50

## 2014-02-20 MED ORDER — DEXTROSE 5 % IV SOLN
INTRAVENOUS | Status: DC
  Administered 2014-02-20 – 2014-02-23 (×4): via INTRAVENOUS
  Filled 2014-02-20 (×6): qty 1000

## 2014-02-20 MED ORDER — MUPIROCIN 2 % EX OINT
1.0000 "application " | TOPICAL_OINTMENT | Freq: Two times a day (BID) | CUTANEOUS | Status: DC
  Administered 2014-02-20 – 2014-02-23 (×7): 1 via NASAL
  Filled 2014-02-20 (×4): qty 22

## 2014-02-20 MED ORDER — PROPOFOL 10 MG/ML IV EMUL
0.0000 ug/kg/min | INTRAVENOUS | Status: DC
  Administered 2014-02-20: 13 ug/kg/min via INTRAVENOUS
  Filled 2014-02-20: qty 100

## 2014-02-20 MED ORDER — CHLORHEXIDINE GLUCONATE CLOTH 2 % EX PADS
6.0000 | MEDICATED_PAD | Freq: Every day | CUTANEOUS | Status: DC
  Administered 2014-02-20 – 2014-02-23 (×4): 6 via TOPICAL

## 2014-02-20 MED ORDER — CETYLPYRIDINIUM CHLORIDE 0.05 % MT LIQD
7.0000 mL | Freq: Four times a day (QID) | OROMUCOSAL | Status: DC
  Administered 2014-02-20 – 2014-02-23 (×8): 7 mL via OROMUCOSAL

## 2014-02-20 NOTE — Progress Notes (Signed)
eLink Physician-Brief Progress Note Patient Name: Amy QualeBeverly Toste DOB: 05/10/1960 MRN: [id number]   Date of Service  02/20/2014  HPI/Events of Note  Case discussed with PCCM NP and resident.  53 y/o female with schizophrenia who has been non-compliant with medications intubated today for encephalopathy after being down for several hours in daytime; extensive workup in ED negative including LP and CT head; fever noted in ED Mixed elevated anion gap metabolic acidosis/alkalosis picture; tox screen with benzo QTc noted to be 550  eICU Interventions  Admit to ICU, short acting sedation for vent synchrony Advised ground team to check ammonia, tsh, b12, folate, rpr, serum osm and urine lytes Unclear etiology but suspect may be medication related or ingestion of some sort Supportive care, monitor BMET/EKG carefully Treat rhabdo with IVF May need MRI/EEG     Intervention Category Evaluation Type: New Patient Evaluation  Amy Casey 02/20/2014, 12:51 AM

## 2014-02-20 NOTE — Procedures (Signed)
Extubation Procedure Note  Patient Details:   Name: Amy QualeBeverly Lemma DOB: 10/11/1960 MRN: [id number]   Airway Documentation:  Airway 7.5 mm (Active)  Secured at (cm) 24 cm 02/20/2014  4:34 AM  Measured From Lips 02/20/2014  4:34 AM  Secured Location Right 02/20/2014  4:34 AM  Secured By [employer] Tube Holder 02/20/2014  4:34 AM  Tube Holder Repositioned Yes 02/20/2014  4:34 AM  Cuff Pressure (cm H2O) 26 cm H2O 02/20/2014  4:34 AM  Site Condition Dry 02/20/2014  1:06 AM    Evaluation  O2 sats: stable throughout Complications: No apparent complications Patient did tolerate procedure well. Bilateral Breath Sounds: Clear Suctioning: Airway Yes   Patient extubated to 4lnc. No complications. Vital signs stable at this time. Patient tolerated well. RN at bedside. RT will continue to monitor.  Ave Filterdkins, Anneliese Leblond Williams 02/20/2014, 8:46 AM

## 2014-02-20 NOTE — Progress Notes (Signed)
  INITIAL PRESENTATION: Progressive AMS over 2 wks. Intubated in ED for obtundation   STUDIES:  11/25: CT head: no intracranial findings  11/25: CXR>>>no findings  11/25: UDS +benzos    SUBJ: Lethargic on low dose of of propofol but able to F/C. Tolerates PS of 5 cm H2O.   OBJ: Filed Vitals:   02/20/14 0645 02/20/14 0700 02/20/14 0715 02/20/14 0804  BP: 122/67 117/64 119/74   Pulse: 100 101 101   Temp:    99.5 F (37.5 C)  TempSrc:    Oral  Resp: 12 12 12    Height:      Weight:      SpO2: 93% 96% 91%    NAD Lethargic HEENT WNL No JVD Chest clear RRR s M Abd soft, +BS Ext warm without edema MAEs  I have reviewed all of today's lab results. Relevant abnormalities are discussed in the A/P section  CXR: NACPD  EKG: QT prolongation. No ischemic changes  IMPRESSION: Acute resp failure due to AMS - appears to be resolved  Extubate today  Monitor in ICU post extubation  Supp O2 as needed AMS/obtundation of unclear etiology. CSF entirely negative. Doubt meningitis or encephalitis  DC Vanc, ceftriaxone, ampicillin, acyclovir  Minimize psychotropics, sedatives  Monitor for BZD withdrawal QT prolongation Minimal elevation of troponin I  Avoid QT prolonging meds  Recheck trop I in AM 11/27  Recheck EKG in AM 11/27  Doubt this warrants further cardiac eval AKI, improving Hypernatremia Hypokalemia  Monitor BMET intermittently  Monitor I/Os  Correct electrolytes as indicated  IVFs adjusted Mild elevation LFTs in nonspecific pattern  Recheck LFTs 11/27 Hyperglycemia without prior DM   CBGs q 4 hrs  Consider SSI for glu > 180   35 mins CCM time  Amy Fischeravid Keir Viernes, MD ; [facility] Mobile [phone].  After 5:30 PM or weekends, call [phone]

## 2014-02-20 NOTE — Progress Notes (Signed)
Pt transported from ED Trauma-C to 2M05 without any complications noted.  Unit RT aware of arrival.

## 2014-02-20 NOTE — Progress Notes (Signed)
eLink Physician-Brief Progress Note Patient Name: Harvel QualeBeverly Tousley DOB: 12/18/1960 MRN: [id number]   Date of Service  02/20/2014  HPI/Events of Note    eICU Interventions  Hypokalemia, repleted      Intervention Category Minor Interventions: Electrolytes abnormality - evaluation and management  MCQUAID, DOUGLAS 02/20/2014, 5:01 AM

## 2014-02-20 NOTE — Progress Notes (Signed)
CRITICAL VALUE ALERT  Critical value received:  Mrsa pos  Date of notification:  11/26  Time of notification:  0500  Critical value read back:yes Nurse who received alert: Avi Kerschner  MD notified (1st page): N/A n contact per nursing protocol.    Time MD responded: n/a

## 2014-02-21 ENCOUNTER — Inpatient Hospital Stay (HOSPITAL_COMMUNITY): Payer: BC Managed Care – PPO

## 2014-02-21 DIAGNOSIS — R404 Transient alteration of awareness: Secondary | ICD-10-CM | POA: Insufficient documentation

## 2014-02-21 DIAGNOSIS — R401 Stupor: Secondary | ICD-10-CM

## 2014-02-21 DIAGNOSIS — R400 Somnolence: Secondary | ICD-10-CM

## 2014-02-21 LAB — CBC
HCT: 39.3 % (ref 36.0–46.0)
Hemoglobin: 12.5 g/dL (ref 12.0–15.0)
MCH: 28.6 pg (ref 26.0–34.0)
MCHC: 31.8 g/dL (ref 30.0–36.0)
MCV: 89.9 fL (ref 78.0–100.0)
Platelets: 235 10*3/uL (ref 150–400)
RBC: 4.37 MIL/uL (ref 3.87–5.11)
RDW: 13.4 % (ref 11.5–15.5)
WBC: 8.1 10*3/uL (ref 4.0–10.5)

## 2014-02-21 LAB — URINE CULTURE
Colony Count: NO GROWTH
Culture: NO GROWTH

## 2014-02-21 LAB — COMPREHENSIVE METABOLIC PANEL
ALT: 68 U/L — ABNORMAL HIGH (ref 0–35)
AST: 133 U/L — ABNORMAL HIGH (ref 0–37)
Albumin: 3.2 g/dL — ABNORMAL LOW (ref 3.5–5.2)
Alkaline Phosphatase: 50 U/L (ref 39–117)
Anion gap: 9 (ref 5–15)
BUN: 29 mg/dL — ABNORMAL HIGH (ref 6–23)
CO2: 26 mEq/L (ref 19–32)
Calcium: 8.6 mg/dL (ref 8.4–10.5)
Chloride: 108 mEq/L (ref 96–112)
Creatinine, Ser: 1.06 mg/dL (ref 0.50–1.10)
GFR calc Af Amer: 67 mL/min — ABNORMAL LOW (ref >90)
GFR calc non Af Amer: 58 mL/min — ABNORMAL LOW (ref >90)
Glucose, Bld: 108 mg/dL — ABNORMAL HIGH (ref 70–99)
Potassium: 3.2 mEq/L — ABNORMAL LOW (ref 3.7–5.3)
Sodium: 143 mEq/L (ref 137–147)
Total Bilirubin: 0.6 mg/dL (ref 0.3–1.2)
Total Protein: 6.5 g/dL (ref 6.0–8.3)

## 2014-02-21 LAB — GLUCOSE, CAPILLARY
Glucose-Capillary: 103 mg/dL — ABNORMAL HIGH (ref 70–99)
Glucose-Capillary: 121 mg/dL — ABNORMAL HIGH (ref 70–99)
Glucose-Capillary: 84 mg/dL (ref 70–99)
Glucose-Capillary: 86 mg/dL (ref 70–99)
Glucose-Capillary: 86 mg/dL (ref 70–99)
Glucose-Capillary: 93 mg/dL (ref 70–99)
Glucose-Capillary: 94 mg/dL (ref 70–99)
Glucose-Capillary: 96 mg/dL (ref 70–99)

## 2014-02-21 LAB — TROPONIN I: Troponin I: <0.3 ng/mL (ref <0.30)

## 2014-02-21 MED ORDER — VANCOMYCIN HCL IN DEXTROSE 1-5 GM/200ML-% IV SOLN
1000.0000 mg | Freq: Two times a day (BID) | INTRAVENOUS | Status: DC
  Administered 2014-02-21 – 2014-02-23 (×5): 1000 mg via INTRAVENOUS
  Filled 2014-02-21 (×7): qty 200

## 2014-02-21 MED ORDER — POTASSIUM CHLORIDE 10 MEQ/100ML IV SOLN
10.0000 meq | INTRAVENOUS | Status: AC
  Administered 2014-02-21 (×5): 10 meq via INTRAVENOUS
  Filled 2014-02-21: qty 100

## 2014-02-21 NOTE — Procedures (Signed)
ELECTROENCEPHALOGRAM REPORT   Patient: Amy Casey       Room #: 1H082M05 EEG No. ID: [id number] Age: 53 y.o.        Sex: female Referring Physician: Tyson AliasFeinstein Report Date:  02/21/2014        Interpreting Physician: Thana FarrEYNOLDS,Keevon Henney D  History: Amy Casey is an 53 y.o. female with altered mental status  Medications:  Scheduled: . antiseptic oral rinse  7 mL Mouth Rinse QID  . chlorhexidine  15 mL Mouth Rinse BID  . Chlorhexidine Gluconate Cloth  6 each Topical Q0600  . enoxaparin (LOVENOX) injection  40 mg Subcutaneous Q24H  . Influenza vac split quadrivalent PF  0.5 mL Intramuscular Tomorrow-1000  . mupirocin ointment  1 application Nasal BID  . vancomycin  1,000 mg Intravenous Q12H    Conditions of Recording:  This is a 16 channel EEG carried out with the patient in the obtunded state.  Description:  The patient is asleep for the majority of the recording with evidence of stage II sleep noted.  There is a poorly organized, slow background rhythm with superimposed symmetrical sleep spindles and vertex central sharp activity.   The patient is alerted during the recording and the background rhythm is noted to be activated at that time.  The posterior background rhythm is observed at an 8 Hz alpha activity and the central and temporal rhythms increase in frequency as well.  No epileptiform activity is noted.  Hyperventilation and intermittent photic stimulation were not performed.  IMPRESSION: This is a normal electroencephalogram.  No epileptiform activity was noted.     Thana FarrLeslie Walsie Smeltz, MD [facility] [phone] 02/21/2014, 7:01 PM

## 2014-02-21 NOTE — Progress Notes (Signed)
CRITICAL VALUE ALERT  Critical value received:  Blood Culture from 11/25 positive Gram Positive cocci in clusters  Date of notification:  02/21/2014   Time of notification:  0834  Critical value read back:Yes.    Nurse who received alert:  Ulis Rias Jiya Kissinger RN  MD notified (1st page):  Simonds  Time of first page:  On the unit  MD notified (2nd page):  Time of second page:  Responding MD:  simonds  Time MD responded:  [phone]

## 2014-02-21 NOTE — Consult Note (Signed)
Referring Physician: Dr. Alva Garnet    Chief Complaint: altered mental status.  HPI:                                                                                                                                         Amy Casey is an 53 y.o. female with a past medical history that is significant for HTN, depression, and renal stones, admitted to [facility] due to altered mental status. Patient is still obtunded and can not contribute to her clinical information. Therefore, all clinical history is obtained form the chart. " She has not acting herself for the past couple of weeks. She hasn't been eating or drinking normally. Has had mild URI type symptoms with infrequent cough. Denied any SOB, chest pain, fever, headache, sick contacts or travel. Having regular bowel movements and voiding. Her last normal was observed about two weeks ago.  Her husband left this morning for work and she was communicating with him. Her returned from work at 5:30 pm and she was slurring her speech and weak. No one had communication with her during the day. They denied any vomiting or incontinence. Husband brought her to [facility] ED.  She has a history of schizophrenia but hasn't been seen by her psychiatrist in over a year. She stopped taking her medications about 5-6 months ago. She has had multiple admission to behavioral health. One prior admission for involuntary commit for possible self harm. Family denies that she would cause self harm. She has prescriptions for benztropine, klonopin and Abilify but these are from 2013. Family denies any alcohol, illicit drug use or tobacco". Had LP in the ED which was unremarkable for infection. UDS+ for benzos. Serologies significant for abnormal AST (133) and ALT 968) but normal ammonia. CT brain 11/25 showed no acute intracranial abnormality. Patient hasn't been taking SSRI's or antipsychotic medications lately. Extubated yesterday and still lethargic. Initial impression was that  patient's presentation most likely medication related.  Date last known well: unclear Time last known well: unclear  tPA Given: no, out of the window   Past Medical History  Diagnosis Date  . History of renal stone   . Wears glasses   . Hypertension   . Depression     Dr. Silvio Pate, [place]; hospitalization 07/2010  . Obesity   . History of echocardiogram 2011    [facility]    Past Surgical History  Procedure Laterality Date  . Wisdom tooth extraction      Family History  Problem Relation Age of Onset  . Diabetes Mother   . Kidney disease Mother     dialysis  . Heart disease Mother   . Cancer Maternal Uncle     lung  . Stroke Neg Hx   . Hypertension Neg Hx   . Hyperlipidemia Neg Hx    Social History:  reports that she has never smoked. She does not have any  smokeless tobacco history on file. She reports that she does not drink alcohol or use illicit drugs.  Allergies: No Known Allergies  Medications:                                                                                                                           Scheduled: . antiseptic oral rinse  7 mL Mouth Rinse QID  . chlorhexidine  15 mL Mouth Rinse BID  . Chlorhexidine Gluconate Cloth  6 each Topical Q0600  . enoxaparin (LOVENOX) injection  40 mg Subcutaneous Q24H  . Influenza vac split quadrivalent PF  0.5 mL Intramuscular Tomorrow-1000  . mupirocin ointment  1 application Nasal BID  . potassium chloride  10 mEq Intravenous Q1 Hr x 5  . vancomycin  1,000 mg Intravenous Q12H    ROS: unobtainable from patient due to mental status                                                                                                         History obtained from unobtainable from patient due to mental status   Physical exam: lethargic. Marland KitchenBlood pressure 156/92, pulse 97, temperature 98.7 F (37.1 C), temperature source Oral, resp. rate 13, height 5' 7" (1.702 m), weight 78.6 kg (173 lb 4.5 oz), SpO2 100 %. Head:  normocephalic. Neck: supple, no bruits, no JVD. Cardiac: no murmurs. Lungs: clear. Abdomen: soft, no tender, no mass. Extremities: no edema. Neurologic Examination:                                                                                                      General: Mental Status: Open eyes, follows simple commands inconsistently, but then becomes lethargic. Cranial Nerves: II: unable to exam as patient offers significant resistance, but there is not gaze preference III,IV, VI: unable to assess V,VII: smile symmetric, facial light touch sensation can not be reliably assessed. VIII: hearing grossly normal bilaterally IX,X: gag reflex present XI: bilateral shoulder shrug no tested XII: midline tongue  Motor: Moves all limbs symmetrically Tone and bulk:normal tone throughout; no atrophy noted Sensory: withdraws to painful stimuli but doesn't  localize. Deep Tendon Reflexes:  2 all over Plantars: Right: downgoing   Left: downgoing Cerebellar: Unable to test Gait:  Unable to test  Results for orders placed or performed during the hospital encounter of 02/19/14 (from the past 48 hour(s))  CBG monitoring, ED     Status: None   Collection Time: 02/19/14  6:36 PM  Result Value Ref Range   Glucose-Capillary 88 70 - 99 mg/dL  Urinalysis, Routine w reflex microscopic     Status: Abnormal   Collection Time: 02/19/14  6:49 PM  Result Value Ref Range   Color, Urine AMBER (A) YELLOW    Comment: BIOCHEMICALS MAY BE AFFECTED BY COLOR   APPearance CLOUDY (A) CLEAR   Specific Gravity, Urine 1.024 1.005 - 1.030   pH 5.0 5.0 - 8.0   Glucose, UA NEGATIVE NEGATIVE mg/dL   Hgb urine dipstick LARGE (A) NEGATIVE   Bilirubin Urine MODERATE (A) NEGATIVE   Ketones, ur 15 (A) NEGATIVE mg/dL   Protein, ur NEGATIVE NEGATIVE mg/dL   Urobilinogen, UA 1.0 0.0 - 1.0 mg/dL   Nitrite NEGATIVE NEGATIVE   Leukocytes, UA NEGATIVE NEGATIVE  Urine rapid drug screen (hosp performed)     Status:  Abnormal   Collection Time: 02/19/14  6:49 PM  Result Value Ref Range   Opiates NONE DETECTED NONE DETECTED   Cocaine NONE DETECTED NONE DETECTED   Benzodiazepines POSITIVE (A) NONE DETECTED   Amphetamines NONE DETECTED NONE DETECTED   Tetrahydrocannabinol NONE DETECTED NONE DETECTED   Barbiturates NONE DETECTED NONE DETECTED    Comment:        DRUG SCREEN FOR MEDICAL PURPOSES ONLY.  IF CONFIRMATION IS NEEDED FOR ANY PURPOSE, NOTIFY LAB WITHIN 5 DAYS.        LOWEST DETECTABLE LIMITS FOR URINE DRUG SCREEN Drug Class       Cutoff (ng/mL) Amphetamine      1000 Barbiturate      200 Benzodiazepine   570 Tricyclics       177 Opiates          300 Cocaine          300 THC              50   Pregnancy, urine     Status: None   Collection Time: 02/19/14  6:49 PM  Result Value Ref Range   Preg Test, Ur NEGATIVE NEGATIVE    Comment:        THE SENSITIVITY OF THIS METHODOLOGY IS >20 mIU/mL.   Urine microscopic-add on     Status: Abnormal   Collection Time: 02/19/14  6:49 PM  Result Value Ref Range   Squamous Epithelial / LPF RARE RARE   WBC, UA 0-2 <3 WBC/hpf   RBC / HPF 3-6 <3 RBC/hpf   Bacteria, UA MANY (A) RARE   Casts HYALINE CASTS (A) NEGATIVE  I-Stat Arterial Blood Gas, ED - (order at [facility] and [facility] only)     Status: Abnormal   Collection Time: 02/19/14  6:54 PM  Result Value Ref Range   pH, Arterial 7.434 7.350 - 7.450   pCO2 arterial 49.4 (H) 35.0 - 45.0 mmHg   pO2, Arterial 70.0 (L) 80.0 - 100.0 mmHg   Bicarbonate 33.1 (H) 20.0 - 24.0 mEq/L   TCO2 35 0 - 100 mmol/L   O2 Saturation 94.0 %   Acid-Base Excess 7.0 (H) 0.0 - 2.0 mmol/L   Collection site RADIAL, ALLEN'S TEST ACCEPTABLE    Drawn by RT  Sample type ARTERIAL   CBC with Differential     Status: Abnormal   Collection Time: 02/19/14  6:58 PM  Result Value Ref Range   WBC 12.9 (H) 4.0 - 10.5 K/uL   RBC 4.98 3.87 - 5.11 MIL/uL   Hemoglobin 14.0 12.0 - 15.0 g/dL   HCT 44.2 36.0 - 46.0 %   MCV 88.8 78.0 - 100.0  fL   MCH 28.1 26.0 - 34.0 pg   MCHC 31.7 30.0 - 36.0 g/dL   RDW 13.1 11.5 - 15.5 %   Platelets 332 150 - 400 K/uL   Neutrophils Relative % 86 (H) 43 - 77 %   Neutro Abs 11.0 (H) 1.7 - 7.7 K/uL   Lymphocytes Relative 10 (L) 12 - 46 %   Lymphs Abs 1.3 0.7 - 4.0 K/uL   Monocytes Relative 4 3 - 12 %   Monocytes Absolute 0.6 0.1 - 1.0 K/uL   Eosinophils Relative 0 0 - 5 %   Eosinophils Absolute 0.0 0.0 - 0.7 K/uL   Basophils Relative 0 0 - 1 %   Basophils Absolute 0.0 0.0 - 0.1 K/uL  Comprehensive metabolic panel     Status: Abnormal   Collection Time: 02/19/14  6:58 PM  Result Value Ref Range   Sodium 153 (H) 137 - 147 mEq/L   Potassium 3.1 (L) 3.7 - 5.3 mEq/L   Chloride 106 96 - 112 mEq/L   CO2 28 19 - 32 mEq/L   Glucose, Bld 94 70 - 99 mg/dL   BUN 73 (H) 6 - 23 mg/dL   Creatinine, Ser 2.64 (H) 0.50 - 1.10 mg/dL   Calcium 9.6 8.4 - 10.5 mg/dL   Total Protein 7.9 6.0 - 8.3 g/dL   Albumin 4.4 3.5 - 5.2 g/dL   AST 85 (H) 0 - 37 U/L   ALT 44 (H) 0 - 35 U/L   Alkaline Phosphatase 54 39 - 117 U/L   Total Bilirubin 0.8 0.3 - 1.2 mg/dL   GFR calc non Af Amer 19 (L) >90 mL/min   GFR calc Af Amer 22 (L) >90 mL/min    Comment: (NOTE) The eGFR has been calculated using the CKD EPI equation. This calculation has not been validated in all clinical situations. eGFR's persistently <90 mL/min signify possible Chronic Kidney Disease.    Anion gap 19 (H) 5 - 15  CK     Status: Abnormal   Collection Time: 02/19/14  6:58 PM  Result Value Ref Range   Total CK 3465 (H) 7 - 177 U/L  Ethanol     Status: None   Collection Time: 02/19/14  6:58 PM  Result Value Ref Range   Alcohol, Ethyl (B) <11 0 - 11 mg/dL    Comment:        LOWEST DETECTABLE LIMIT FOR SERUM ALCOHOL IS 11 mg/dL FOR MEDICAL PURPOSES ONLY   Acetaminophen level     Status: None   Collection Time: 02/19/14  6:58 PM  Result Value Ref Range   Acetaminophen (Tylenol), Serum <15.0 10 - 30 ug/mL    Comment:        THERAPEUTIC  CONCENTRATIONS VARY SIGNIFICANTLY. A RANGE OF 10-30 ug/mL MAY BE AN EFFECTIVE CONCENTRATION FOR MANY PATIENTS. HOWEVER, SOME ARE BEST TREATED AT CONCENTRATIONS OUTSIDE THIS RANGE. ACETAMINOPHEN CONCENTRATIONS >150 ug/mL AT 4 HOURS AFTER INGESTION AND >50 ug/mL AT 12 HOURS AFTER INGESTION ARE OFTEN ASSOCIATED WITH TOXIC REACTIONS.   Salicylate level     Status: Abnormal   Collection  Time: 02/19/14  6:58 PM  Result Value Ref Range   Salicylate Lvl <0.1 (L) 2.8 - 20.0 mg/dL  Blood culture (routine x 2)     Status: None (Preliminary result)   Collection Time: 02/19/14  6:58 PM  Result Value Ref Range   Specimen Description BLOOD LEFT ARM    Special Requests BOTTLES DRAWN AEROBIC ONLY 5CC    Culture  Setup Time      02/20/2014 01:45 Performed at [facility] Note: Gram Stain Report Called to,Read Back By and Verified With: CHRIS MCKEOWN 02/21/14 0845 Performed at [facility]    Report Status PENDING   I-Stat Chem 8, ED     Status: Abnormal   Collection Time: 02/19/14  7:16 PM  Result Value Ref Range   Sodium 153 (H) 137 - 147 mEq/L   Potassium 3.4 (L) 3.7 - 5.3 mEq/L   Chloride 106 96 - 112 mEq/L   BUN 76 (H) 6 - 23 mg/dL   Creatinine, Ser 2.90 (H) 0.50 - 1.10 mg/dL   Glucose, Bld 105 (H) 70 - 99 mg/dL   Calcium, Ion 1.10 (L) 1.12 - 1.23 mmol/L   TCO2 29 0 - 100 mmol/L   Hemoglobin 16.0 (H) 12.0 - 15.0 g/dL   HCT 47.0 (H) 36.0 - 46.0 %  I-Stat CG4 Lactic Acid, ED     Status: Abnormal   Collection Time: 02/19/14  7:17 PM  Result Value Ref Range   Lactic Acid, Venous 2.64 (H) 0.5 - 2.2 mmol/L  I-Stat arterial blood gas, ED     Status: Abnormal   Collection Time: 02/19/14  9:12 PM  Result Value Ref Range   pH, Arterial 7.529 (H) 7.350 - 7.450   pCO2 arterial 35.6 35.0 - 45.0 mmHg   pO2, Arterial 463.0 (H) 80.0 - 100.0 mmHg   Bicarbonate 29.5 (H) 20.0 - 24.0 mEq/L   TCO2 31 0 - 100 mmol/L   O2 Saturation  100.0 %   Acid-Base Excess 7.0 (H) 0.0 - 2.0 mmol/L   Patient temperature 99.4 F    Collection site RADIAL, ALLEN'S TEST ACCEPTABLE    Drawn by Operator    Sample type ARTERIAL   CSF cell count with differential collection tube #: 1     Status: None   Collection Time: 02/19/14 10:18 PM  Result Value Ref Range   Tube # 1    Color, CSF COLORLESS COLORLESS   Appearance, CSF CLEAR CLEAR   Supernatant NOT INDICATED    RBC Count, CSF 0 0 /cu mm   WBC, CSF 1 0 - 5 /cu mm   Lymphs, CSF RARE 40 - 80 %   Other Cells, CSF TOO FEW TO COUNT, SMEAR AVAILABLE FOR REVIEW   CSF cell count with differential collection tube #: 4     Status: None   Collection Time: 02/19/14 10:18 PM  Result Value Ref Range   Tube # 4    Color, CSF COLORLESS COLORLESS   Appearance, CSF CLEAR CLEAR   Supernatant NOT INDICATED    RBC Count, CSF 0 0 /cu mm   WBC, CSF 0 0 - 5 /cu mm   Lymphs, CSF RARE 40 - 80 %   Other Cells, CSF TOO FEW TO COUNT, SMEAR AVAILABLE FOR REVIEW   Protein and glucose, CSF     Status: Abnormal   Collection Time: 02/19/14 10:18 PM  Result Value Ref  Range   Glucose, CSF 79 (H) 43 - 76 mg/dL   Total  Protein, CSF 34 15 - 45 mg/dL  CSF culture     Status: None (Preliminary result)   Collection Time: 02/19/14 10:18 PM  Result Value Ref Range   Specimen Description CSF    Special Requests NO 2 2CC    Gram Stain      CYTOSPIN SLIDE WBC PRESENT, PREDOMINANTLY MONONUCLEAR NO ORGANISMS SEEN Performed at [facility] Performed at [facility] PENDING    Report Status PENDING   Gram stain     Status: None   Collection Time: 02/19/14 10:18 PM  Result Value Ref Range   Specimen Description CSF    Special Requests NO 2 2CC    Gram Stain      CYTOSPIN WBC PRESENT, PREDOMINANTLY MONONUCLEAR NO ORGANISMS SEEN    Report Status 02/19/2014 FINAL   I-Stat arterial blood gas, ED     Status: Abnormal   Collection Time: 02/20/14 12:44 AM  Result Value Ref Range   pH,  Arterial 7.392 7.350 - 7.450   pCO2 arterial 43.8 35.0 - 45.0 mmHg   pO2, Arterial 167.0 (H) 80.0 - 100.0 mmHg   Bicarbonate 26.7 (H) 20.0 - 24.0 mEq/L   TCO2 28 0 - 100 mmol/L   O2 Saturation 99.0 %   Acid-Base Excess 1.0 0.0 - 2.0 mmol/L   Patient temperature 98.5 F    Collection site RADIAL, ALLEN'S TEST ACCEPTABLE    Drawn by Operator    Sample type ARTERIAL   Ammonia     Status: None   Collection Time: 02/20/14 12:47 AM  Result Value Ref Range   Ammonia 19 11 - 60 umol/L  Glucose, capillary     Status: Abnormal   Collection Time: 02/20/14  1:01 AM  Result Value Ref Range   Glucose-Capillary 100 (H) 70 - 99 mg/dL   Comment 1 Notify RN    Comment 2 Documented in Chart   MRSA PCR Screening     Status: Abnormal   Collection Time: 02/20/14  1:12 AM  Result Value Ref Range   MRSA by PCR POSITIVE (A) NEGATIVE    Comment:        The GeneXpert MRSA Assay (FDA approved for NASAL specimens only), is one component of a comprehensive MRSA colonization surveillance program. It is not intended to diagnose MRSA infection nor to guide or monitor treatment for MRSA infections. RESULT CALLED TO, READ BACK BY AND VERIFIED WITH: CALLED TO RN Ander Purpura RUSSELL [phone] _0  THANEY   Osmolality, urine     Status: None   Collection Time: 02/20/14  1:16 AM  Result Value Ref Range   Osmolality, Ur 545 390 - 1090 mOsm/kg    Comment: Performed at [facility]  Sodium, urine, random     Status: None   Collection Time: 02/20/14  1:16 AM  Result Value Ref Range   Sodium, Ur 125 mEq/L  Chloride, urine, random     Status: None   Collection Time: 02/20/14  1:16 AM  Result Value Ref Range   Chloride Urine 102 mEq/L  CBC     Status: None   Collection Time: 02/20/14  2:35 AM  Result Value Ref Range   WBC 10.2 4.0 - 10.5 K/uL   RBC 4.29 3.87 - 5.11 MIL/uL   Hemoglobin 12.1 12.0 - 15.0 g/dL    Comment: DELTA CHECK NOTED REPEATED TO VERIFY    HCT 37.9  36.0 - 46.0 %   MCV 88.3 78.0 -  100.0 fL   MCH 28.2 26.0 - 34.0 pg   MCHC 31.9 30.0 - 36.0 g/dL   RDW 13.3 11.5 - 15.5 %   Platelets 283 150 - 400 K/uL  Basic metabolic panel     Status: Abnormal   Collection Time: 02/20/14  2:35 AM  Result Value Ref Range   Sodium 148 (H) 137 - 147 mEq/L   Potassium 3.4 (L) 3.7 - 5.3 mEq/L   Chloride 108 96 - 112 mEq/L   CO2 22 19 - 32 mEq/L   Glucose, Bld 250 (H) 70 - 99 mg/dL   BUN 54 (H) 6 - 23 mg/dL   Creatinine, Ser 1.69 (H) 0.50 - 1.10 mg/dL    Comment: DELTA CHECK NOTED   Calcium 8.1 (L) 8.4 - 10.5 mg/dL   GFR calc non Af Amer 33 (L) >90 mL/min   GFR calc Af Amer 38 (L) >90 mL/min    Comment: (NOTE) The eGFR has been calculated using the CKD EPI equation. This calculation has not been validated in all clinical situations. eGFR's persistently <90 mL/min signify possible Chronic Kidney Disease.    Anion gap 18 (H) 5 - 15  Triglycerides     Status: None   Collection Time: 02/20/14  2:35 AM  Result Value Ref Range   Triglycerides 130 <150 mg/dL  TSH     Status: None   Collection Time: 02/20/14  2:35 AM  Result Value Ref Range   TSH 0.747 0.350 - 4.500 uIU/mL  CK     Status: Abnormal   Collection Time: 02/20/14  2:35 AM  Result Value Ref Range   Total CK 8358 (H) 7 - 177 U/L  RPR     Status: None   Collection Time: 02/20/14  2:35 AM  Result Value Ref Range   RPR NON REAC NON REAC    Comment: Performed at [facility]     Status: None   Collection Time: 02/20/14  2:35 AM  Result Value Ref Range   Vitamin B-12 577 211 - 911 pg/mL    Comment: Performed at [facility]  Osmolality     Status: Abnormal   Collection Time: 02/20/14  2:35 AM  Result Value Ref Range   Osmolality 333 (H) 275 - 300 mOsm/kg    Comment: Performed at [facility]  Cortisol     Status: None   Collection Time: 02/20/14  2:35 AM  Result Value Ref Range   Cortisol, Plasma 33.9 ug/dL    Comment: (NOTE) AM:  4.3 - 22.4 ug/dL PM:  3.1 - 16.7  ug/dL Performed at [facility]   Blood gas, arterial     Status: Abnormal   Collection Time: 02/20/14  4:59 AM  Result Value Ref Range   FIO2 0.40 %   Delivery systems VENTILATOR    Mode PRESSURE REGULATED VOLUME CONTROL    VT 490 mL   Rate 12 resp/min   Peep/cpap 5.0 cm H20   pH, Arterial 7.435 7.350 - 7.450   pCO2 arterial 39.3 35.0 - 45.0 mmHg   pO2, Arterial 173.0 (H) 80.0 - 100.0 mmHg   Bicarbonate 26.0 (H) 20.0 - 24.0 mEq/L   TCO2 27.2 0 - 100 mmol/L   Acid-Base Excess 2.1 (H) 0.0 - 2.0 mmol/L   O2 Saturation 99.2 %   Patient temperature 98.6    Collection site RIGHT RADIAL    Drawn  by [phone]    Sample type ARTERIAL DRAW    Allens test (pass/fail) PASS PASS  Troponin I     Status: Abnormal   Collection Time: 02/20/14  5:46 AM  Result Value Ref Range   Troponin I 0.96 (HH) <0.30 ng/mL    Comment:        Due to the release kinetics of cTnI, a negative result within the first hours of the onset of symptoms does not rule out myocardial infarction with certainty. If myocardial infarction is still suspected, repeat the test at appropriate intervals. CRITICAL RESULT CALLED TO, READ BACK BY AND VERIFIED WITH: T.HARVEY,RN 02/20/14 0827 BY BSLADE   Amylase     Status: Abnormal   Collection Time: 02/20/14  5:46 AM  Result Value Ref Range   Amylase 113 (H) 0 - 105 U/L  Hepatic function panel     Status: Abnormal   Collection Time: 02/20/14  5:46 AM  Result Value Ref Range   Total Protein 6.8 6.0 - 8.3 g/dL   Albumin 3.7 3.5 - 5.2 g/dL   AST 162 (H) 0 - 37 U/L   ALT 64 (H) 0 - 35 U/L   Alkaline Phosphatase 49 39 - 117 U/L   Total Bilirubin 0.6 0.3 - 1.2 mg/dL   Bilirubin, Direct <0.2 0.0 - 0.3 mg/dL   Indirect Bilirubin NOT CALCULATED 0.3 - 0.9 mg/dL  Lipase, blood     Status: None   Collection Time: 02/20/14  5:46 AM  Result Value Ref Range   Lipase 45 11 - 59 U/L  Magnesium     Status: None   Collection Time: 02/20/14  5:46 AM  Result Value Ref Range    Magnesium 2.5 1.5 - 2.5 mg/dL  Phosphorus     Status: None   Collection Time: 02/20/14  5:46 AM  Result Value Ref Range   Phosphorus 3.4 2.3 - 4.6 mg/dL  Glucose, capillary     Status: None   Collection Time: 02/20/14  7:57 AM  Result Value Ref Range   Glucose-Capillary 99 70 - 99 mg/dL  Glucose, capillary     Status: Abnormal   Collection Time: 02/20/14 11:53 AM  Result Value Ref Range   Glucose-Capillary 104 (H) 70 - 99 mg/dL  Glucose, capillary     Status: None   Collection Time: 02/20/14  4:54 PM  Result Value Ref Range   Glucose-Capillary 86 70 - 99 mg/dL  Glucose, capillary     Status: None   Collection Time: 02/20/14  8:11 PM  Result Value Ref Range   Glucose-Capillary 88 70 - 99 mg/dL   Comment 1 Notify RN   Glucose, capillary     Status: None   Collection Time: 02/20/14 11:25 PM  Result Value Ref Range   Glucose-Capillary 93 70 - 99 mg/dL  CBC     Status: None   Collection Time: 02/21/14  2:15 AM  Result Value Ref Range   WBC 8.1 4.0 - 10.5 K/uL   RBC 4.37 3.87 - 5.11 MIL/uL   Hemoglobin 12.5 12.0 - 15.0 g/dL   HCT 39.3 36.0 - 46.0 %   MCV 89.9 78.0 - 100.0 fL   MCH 28.6 26.0 - 34.0 pg   MCHC 31.8 30.0 - 36.0 g/dL   RDW 13.4 11.5 - 15.5 %   Platelets 235 150 - 400 K/uL  Comprehensive metabolic panel     Status: Abnormal   Collection Time: 02/21/14  2:15 AM  Result Value Ref Range  Sodium 143 137 - 147 mEq/L   Potassium 3.2 (L) 3.7 - 5.3 mEq/L   Chloride 108 96 - 112 mEq/L   CO2 26 19 - 32 mEq/L   Glucose, Bld 108 (H) 70 - 99 mg/dL   BUN 29 (H) 6 - 23 mg/dL    Comment: DELTA CHECK NOTED   Creatinine, Ser 1.06 0.50 - 1.10 mg/dL    Comment: DELTA CHECK NOTED   Calcium 8.6 8.4 - 10.5 mg/dL   Total Protein 6.5 6.0 - 8.3 g/dL   Albumin 3.2 (L) 3.5 - 5.2 g/dL   AST 133 (H) 0 - 37 U/L   ALT 68 (H) 0 - 35 U/L   Alkaline Phosphatase 50 39 - 117 U/L   Total Bilirubin 0.6 0.3 - 1.2 mg/dL   GFR calc non Af Amer 58 (L) >90 mL/min   GFR calc Af Amer 67 (L) >90  mL/min    Comment: (NOTE) The eGFR has been calculated using the CKD EPI equation. This calculation has not been validated in all clinical situations. eGFR's persistently <90 mL/min signify possible Chronic Kidney Disease.    Anion gap 9 5 - 15  Troponin I     Status: None   Collection Time: 02/21/14  2:15 AM  Result Value Ref Range   Troponin I <0.30 <0.30 ng/mL    Comment:        Due to the release kinetics of cTnI, a negative result within the first hours of the onset of symptoms does not rule out myocardial infarction with certainty. If myocardial infarction is still suspected, repeat the test at appropriate intervals.   Glucose, capillary     Status: None   Collection Time: 02/21/14  3:51 AM  Result Value Ref Range   Glucose-Capillary 86 70 - 99 mg/dL  Glucose, capillary     Status: None   Collection Time: 02/21/14  7:34 AM  Result Value Ref Range   Glucose-Capillary 96 70 - 99 mg/dL   Ct Head Wo Contrast  02/19/2014   CLINICAL DATA:  Unresponsive.  EXAM: CT HEAD WITHOUT CONTRAST  TECHNIQUE: Contiguous axial images were obtained from the base of the skull through the vertex without intravenous contrast.  COMPARISON:  01/11/2009  FINDINGS: Skull and Sinuses:Negative for fracture or destructive process. The mastoids, middle ears, and imaged paranasal sinuses are clear.  Orbits: No acute abnormality.  Brain: No evidence of acute abnormality, such as acute infarction, hemorrhage, hydrocephalus, or mass lesion/mass effect. There is patchy bilateral cerebral white matter low density which is moderate for age. Given history of hypertension, this is likely chronic small vessel disease. These changes have progressed since 2010.  IMPRESSION: 1. No acute intracranial findings. 2. Moderate white matter disease, likely chronic small vessel ischemia, progressed since 2010.   Electronically Signed   By: Jorje Guild M.D.   On: 02/19/2014 21:01   Dg Chest Port 1 View  02/20/2014   CLINICAL  DATA:  Altered mental status. The patient is intubated for airway protection.  EXAM: PORTABLE CHEST - 1 VIEW  COMPARISON:  Single view of the chest 02/19/2014.  FINDINGS: ET tube remains in place in good position with the tip at the clavicular heads. The NG tube has been advanced but the side port remains above the gastroesophageal junction. Recommend advancement of 5 cm for better positioning. The lungs are clear. No pneumothorax or pleural effusion. Heart size is normal.  IMPRESSION: NG tube should be advanced approximately 5 cm for better positioning.  No acute cardiopulmonary disease.   Electronically Signed   By: Inge Rise M.D.   On: 02/20/2014 08:40   Dg Chest Portable 1 View  02/19/2014   CLINICAL DATA:  Intubation granuloma.  EXAM: PORTABLE CHEST - 1 VIEW  COMPARISON:  02/19/2014  FINDINGS: Endotracheal tube tip is approximately 4.5 cm above the carina. A nasogastric tube is seen with tip just below the GE junction.  Both lungs are clear. No evidence of pneumothorax or pleural effusion. Heart size within normal limits.  IMPRESSION: No active lung disease.   Electronically Signed   By: Earle Gell M.D.   On: 02/19/2014 19:43   Dg Chest Portable 1 View  02/19/2014   CLINICAL DATA:  Schizophrenia and off psychotic medications. Dealing with depression. Recent fall. Complains of pain to left shoulder.  EXAM: PORTABLE CHEST - 1 VIEW  COMPARISON:  None.  FINDINGS: Heart size appears enlarged. There is calcified plaque noted within the thoracic aorta. Both lungs are clear. The visualized skeletal structures are unremarkable.  IMPRESSION: 1. Cardiac enlargement. 2. No heart failure.   Electronically Signed   By: Kerby Moors M.D.   On: 02/19/2014 19:03   Dg Abd Portable 1v  02/20/2014   CLINICAL DATA:  Orogastric tube placement  EXAM: PORTABLE ABDOMEN - 1 VIEW  COMPARISON:  None.  FINDINGS: Orogastric tube tip at the proximal stomach, with side port near the GE junction. Advancement by 5 cm would  provide more secure positioning.  The visualized bowel gas pattern is nonobstructive. Lung bases are grossly clear.  IMPRESSION: Orogastric tube tip at the proximal stomach. Advancement by 5 cm would provide more secure positioning.   Electronically Signed   By: Jorje Guild M.D.   On: 02/20/2014 03:50    Assessment: 53 y.o. female with psych history and reported changes in mental status for the past couple of weeks which hasn't dramatically change since admission to the hospital. Initial impression was that this was medication related. She has a non focal exam but remains lethargic without a clear medical reason. CT brain and CSF unremarkable. Encephalopathy of unclear etiology. She has been off SSRI's and antipsychotics for a while and her exam is not suggestive of a toxidrome. Will get EEG and MRI brain hoping to better understand her syndrome. Will follow up.  Dorian Pod, MD [facility] [phone]  02/21/2014, 10:06 AM

## 2014-02-21 NOTE — Progress Notes (Signed)
PULMONARY / CRITICAL CARE MEDICINE   Name: Amy Casey MRN: [id number] DOB: 02/08/1961    ADMISSION DATE:  02/19/2014 CONSULTATION DATE:  02/19/2014  REFERRING MD:  EDP  CHIEF COMPLAINT:  Altered mental status  INITIAL PRESENTATION: 53 y.o. female with significant psych history (schizophrenia) presents with 2 weeks progressive altered mental status with URI symptoms.   STUDIES:  11/25 CT Head negative 11/25 CXR: no acute 11/25 UDS: +benzos 11/25 CSF: Glucose 79, protein 34. 0-1 WBCs, no organisms  SIGNIFICANT EVENTS: 11/25 admitted, intubated for airway protection. LP and CT head negative 11/26 extubated  SUBJECTIVE:  RN reports pt sedated/somnolent overnight. No other acute events. All sedating meds currently held.   VITAL SIGNS: Temp:  [98.4 F (36.9 C)-99.4 F (37.4 C)] 98.7 F (37.1 C) (11/27 0738) Pulse Rate:  [69-102] 97 (11/27 0900) Resp:  [11-25] 13 (11/27 0900) BP: (96-156)/(54-100) 156/92 mmHg (11/27 0900) SpO2:  [99 %-100 %] 100 % (11/27 0900) HEMODYNAMICS:   VENTILATOR SETTINGS:   INTAKE / OUTPUT:  Intake/Output Summary (Last 24 hours) at 02/21/14 0942 Last data filed at 02/21/14 [id number]  Gross per 24 hour  Intake 1146.67 ml  Output    780 ml  Net 366.67 ml    PHYSICAL EXAMINATION: General:  NAD Neuro:  RASS -3 HEENT:  Bilateral scleral injection Cardiovascular:  Tachycardia, regular rhythm, normal s1s2, no murmur Lungs:  Clear bilaterally Abdomen:  Soft, nontender, bowel sounds present Musculoskeletal:  Intact, no edema Skin:  No rashes noted  LABS:  CBC  Recent Labs Lab 02/19/14 1858 02/19/14 1916 02/20/14 0235 02/21/14 0215  WBC 12.9*  --  10.2 8.1  HGB 14.0 16.0* 12.1 12.5  HCT 44.2 47.0* 37.9 39.3  PLT 332  --  283 235   Coag's No results for input(s): APTT, INR in the last 168 hours. BMET  Recent Labs Lab 02/19/14 1858 02/19/14 1916 02/20/14 0235 02/21/14 0215  NA 153* 153* 148* 143  K 3.1* 3.4* 3.4* 3.2*  CL  106 106 108 108  CO2 28  --  22 26  BUN 73* 76* 54* 29*  CREATININE 2.64* 2.90* 1.69* 1.06  GLUCOSE 94 105* 250* 108*   Electrolytes  Recent Labs Lab 02/19/14 1858 02/20/14 0235 02/20/14 0546 02/21/14 0215  CALCIUM 9.6 8.1*  --  8.6  MG  --   --  2.5  --   PHOS  --   --  3.4  --    Sepsis Markers  Recent Labs Lab 02/19/14 1917  LATICACIDVEN 2.64*   ABG  Recent Labs Lab 02/19/14 2112 02/20/14 0044 02/20/14 0459  PHART 7.529* 7.392 7.435  PCO2ART 35.6 43.8 39.3  PO2ART 463.0* 167.0* 173.0*   Liver Enzymes  Recent Labs Lab 02/19/14 1858 02/20/14 0546 02/21/14 0215  AST 85* 162* 133*  ALT 44* 64* 68*  ALKPHOS 54 49 50  BILITOT 0.8 0.6 0.6  ALBUMIN 4.4 3.7 3.2*   Cardiac Enzymes  Recent Labs Lab 02/20/14 0546 02/21/14 0215  TROPONINI 0.96* <0.30   Glucose  Recent Labs Lab 02/20/14 1153 02/20/14 1654 02/20/14 2011 02/20/14 2325 02/21/14 0351 02/21/14 0734  GLUCAP 104* 86 88 93 86 96    Imaging Dg Chest Port 1 View  02/20/2014   CLINICAL DATA:  Altered mental status. The patient is intubated for airway protection.  EXAM: PORTABLE CHEST - 1 VIEW  COMPARISON:  Single view of the chest 02/19/2014.  FINDINGS: ET tube remains in place in good position with the tip at the clavicular  heads. The NG tube has been advanced but the side port remains above the gastroesophageal junction. Recommend advancement of 5 cm for better positioning. The lungs are clear. No pneumothorax or pleural effusion. Heart size is normal.  IMPRESSION: NG tube should be advanced approximately 5 cm for better positioning.  No acute cardiopulmonary disease.   Electronically Signed   By: Drusilla Kannerhomas  Dalessio M.D.   On: 02/20/2014 08:40   Dg Abd Portable 1v  02/20/2014   CLINICAL DATA:  Orogastric tube placement  EXAM: PORTABLE ABDOMEN - 1 VIEW  COMPARISON:  None.  FINDINGS: Orogastric tube tip at the proximal stomach, with side port near the GE junction. Advancement by 5 cm would provide  more secure positioning.  The visualized bowel gas pattern is nonobstructive. Lung bases are grossly clear.  IMPRESSION: Orogastric tube tip at the proximal stomach. Advancement by 5 cm would provide more secure positioning.   Electronically Signed   By: Tiburcio PeaJonathan  Watts M.D.   On: 02/20/2014 03:50    ASSESSMENT / PLAN:  NEUROLOGIC A:   Acute metabolic encephalopathy: CSF negative so doubtful for meningitis/encephalitis P:   RASS goal: 0 Appreciate neuro assistance EEG MRI/MRA  PULMONARY OETT 11/25>>11/26 A: Acute respiratory failure due to AMS, resolved P:   Monitor  CARDIOVASCULAR CVL A: Prolonged QTc, improved 11/27 538 Small troponin leak, suspected demand ischemia P:  Avoid QT prolonging meds  RENAL A:   AKI, improved.  Rhabdomyolysis may be contributing to aki Hypernatremia, resolved Hypokalemia, continued P:   Replete electrolytes as indicated Repeat CK tomorrow  GASTROINTESTINAL A:   Mild transaminitis, continued. Ammonia 19 on 11/26. P:   Consider hep panel, RUQ ultrasound  HEMATOLOGIC A:   Hemoconcentrated, resolved P:  Monitor cbc DVT prophylaxis: Lovenox  INFECTIOUS A:   No known source P:   BCx2 11/25>>> gram pos cocci clusters x 1 tube (suspect contaminant)  BCx2 11/27>>> UC 11/26>>> CSF culture 11/25>>> CSF gram stain 11/25>>> no organisms Abx:  Ampicillin 11/25>>11/26 Acyclovir 11/26>>11/26 Ceftriaxone 11/25>>11/25 Vancomycin 11/26>>  ENDOCRINE A:   Hyperglycemia, no known DM Normal TSH P:   CBG monitoring Hgb A1c with labs tomorrow  FAMILY  - Updates: no family at bedside 11/27  - Inter-disciplinary family meet or Palliative Care meeting due by:  12/1   TODAY'S SUMMARY: Continued encephalopathy post-extubation and off sedating meds. Gram pos cocci in clusters in 1 bottle, will repeat blood cx and continue vancomycin until resulted. Have asked neuro to consult.    Tawni CarnesAndrew Wight, MD 02/21/2014, 10:35 AM PGY-2, [facility]   PCCM ATTENDING: Pt seen on work rounds with care provider noted above. We reviewed pt's initial presentation, consultants notes and hospital database in detail.  The above assessment and plan was formulated under my direction.  In summary: She is slightly more responsive today but remains very lethargic I have requested Neuro Consult to eval. Mri and EEG ordered The "+" blood culture is likely a contaminant but will cover with Vanc until speciation Watch in ICU through day today   Billy Fischeravid Emmalina Espericueta, MD;  PCCM service; Mobile [phone]

## 2014-02-21 NOTE — Progress Notes (Signed)
EEG completed; results pending.    

## 2014-02-21 NOTE — Progress Notes (Signed)
ANTIBIOTIC CONSULT NOTE - INITIAL  Pharmacy Consult for Vancomycin Indication: GPC in blood  No Known Allergies  Patient Measurements: Height: 5\' 7"  (170.2 cm) Weight: 173 lb 4.5 oz (78.6 kg) IBW/kg (Calculated) : 61.6 Vital Signs: Temp: 98.7 F (37.1 C) (11/27 0738) Temp Source: Oral (11/27 0738) BP: 156/92 mmHg (11/27 0900) Pulse Rate: 97 (11/27 0900) Intake/Output from previous day: 11/26 0701 - 11/27 0700 In: 1212.1 [I.V.:1112.1; IV Piggyback:100] Out: 1005 [Urine:1005] Intake/Output from this shift: Total I/O In: -  Out: 25 [Urine:25]  Labs:  Recent Labs  02/19/14 1858 02/19/14 1916 02/20/14 0235 02/21/14 0215  WBC 12.9*  --  10.2 8.1  HGB 14.0 16.0* 12.1 12.5  PLT 332  --  283 235  CREATININE 2.64* 2.90* 1.69* 1.06   Estimated Creatinine Clearance: 64.8 mL/min (by C-G formula based on Cr of 1.06). No results for input(s): VANCOTROUGH, VANCOPEAK, VANCORANDOM, GENTTROUGH, GENTPEAK, GENTRANDOM, TOBRATROUGH, TOBRAPEAK, TOBRARND, AMIKACINPEAK, AMIKACINTROU, AMIKACIN in the last 72 hours.   Microbiology: Recent Results (from the past 720 hour(s))  Blood culture (routine x 2)     Status: None (Preliminary result)   Collection Time: 02/19/14  6:58 PM  Result Value Ref Range Status   Specimen Description BLOOD LEFT ARM  Final   Special Requests BOTTLES DRAWN AEROBIC ONLY 5CC  Final   Culture  Setup Time   Final    02/20/2014 01:45 Performed at [facility]    Culture   Final    GRAM POSITIVE COCCI IN CLUSTERS Note: Gram Stain Report Called to,Read Back By and Verified With: CHRIS MCKEOWN 02/21/14 0845 Performed at [facility]    Report Status PENDING  Incomplete  CSF culture     Status: None (Preliminary result)   Collection Time: 02/19/14 10:18 PM  Result Value Ref Range Status   Specimen Description CSF  Final   Special Requests NO 2 2CC  Final   Gram Stain   Final    CYTOSPIN SLIDE WBC PRESENT, PREDOMINANTLY MONONUCLEAR NO ORGANISMS  SEEN [facility] Performed at [facility]    Culture PENDING  Incomplete   Report Status PENDING  Incomplete  Gram stain     Status: None   Collection Time: 02/19/14 10:18 PM  Result Value Ref Range Status   Specimen Description CSF  Final   Special Requests NO 2 2CC  Final   Gram Stain   Final    CYTOSPIN WBC PRESENT, PREDOMINANTLY MONONUCLEAR NO ORGANISMS SEEN    Report Status 02/19/2014 FINAL  Final  MRSA PCR Screening     Status: Abnormal   Collection Time: 02/20/14  1:12 AM  Result Value Ref Range Status   MRSA by PCR POSITIVE (A) NEGATIVE Final    Comment:        The GeneXpert MRSA Assay (FDA approved for NASAL specimens only), is one component of a comprehensive MRSA colonization surveillance program. It is not intended to diagnose MRSA infection nor to guide or monitor treatment for MRSA infections. RESULT CALLED TO, READ BACK BY AND VERIFIED WITH: CALLED TO RN Doren CustardLAUREN RUSSELL [phone] @0504  THANEY     Medical History: Past Medical History  Diagnosis Date  . History of renal stone   . Wears glasses   . Hypertension   . Depression     Dr. Mila HomerSena, [facility]; hospitalization 07/2010  . Obesity   . History of echocardiogram 2011    [facility]    Medications:  Anti-infectives    Start  Dose/Rate Route Frequency Ordered Stop   02/20/14 2300  vancomycin (VANCOCIN) IVPB 750 mg/150 ml premix  Status:  Discontinued     750 mg150 mL/hr over 60 Minutes Intravenous Every 24 hours 02/19/14 2226 02/20/14 0835   02/19/14 2300  acyclovir (ZOVIRAX) 600 mg in dextrose 5 % 100 mL IVPB  Status:  Discontinued     600 mg112 mL/hr over 60 Minutes Intravenous Every 24 hours 02/19/14 2226 02/20/14 0835   02/19/14 2230  vancomycin (VANCOCIN) IVPB 1000 mg/200 mL premix     1,000 mg200 mL/hr over 60 Minutes Intravenous  Once 02/19/14 2220 02/20/14 0122   02/19/14 2230  ampicillin (OMNIPEN) 1 g in sodium chloride 0.9 % 50 mL IVPB  Status:  Discontinued      1 g150 mL/hr over 20 Minutes Intravenous Every 8 hours 02/19/14 2226 02/20/14 0835   02/19/14 2215  vancomycin (VANCOCIN) 2,000 mg in sodium chloride 0.9 % 500 mL IVPB  Status:  Discontinued     2,000 mg250 mL/hr over 120 Minutes Intravenous STAT 02/19/14 2209 02/19/14 2216   02/19/14 2215  piperacillin-tazobactam (ZOSYN) IVPB 3.375 g  Status:  Discontinued     3.375 g100 mL/hr over 30 Minutes Intravenous  Once 02/19/14 2209 02/19/14 2212   02/19/14 2215  cefTRIAXone (ROCEPHIN) 2 g in dextrose 5 % 50 mL IVPB  Status:  Discontinued     2 g100 mL/hr over 30 Minutes Intravenous Every 12 hours 02/19/14 2213 02/20/14 0835     Assessment: 53 year old female with Tmax 100 and wbc wnl but blood culture 1/2 with GPC in clusters to start empiric vancomycin. Vancomycin was stopped on 11/26- last dose was 11/26 at midnight. Will resume. SCr 1.06 with estimated CrCl~65 mL/min.   Goal of Therapy:  Vancomycin trough level 15-20 mcg/ml  Plan:  Vancomycin 1g IV q12h. Follow-up renal function, culture results, and clinical status.  Vancomycin trough at steady state if continue therapy.  Link SnufferJessica Charitie Hinote, PharmD, BCPS Clinical Pharmacist [phone] 02/21/2014,9:13 AM

## 2014-02-22 DIAGNOSIS — R7881 Bacteremia: Secondary | ICD-10-CM

## 2014-02-22 DIAGNOSIS — G9340 Encephalopathy, unspecified: Secondary | ICD-10-CM

## 2014-02-22 LAB — CULTURE, BLOOD (ROUTINE X 2)

## 2014-02-22 LAB — CK: Total CK: 3094 U/L — ABNORMAL HIGH (ref 7–177)

## 2014-02-22 LAB — CBC
HCT: 39.3 % (ref 36.0–46.0)
Hemoglobin: 12.8 g/dL (ref 12.0–15.0)
MCH: 29.5 pg (ref 26.0–34.0)
MCHC: 32.6 g/dL (ref 30.0–36.0)
MCV: 90.6 fL (ref 78.0–100.0)
Platelets: 236 10*3/uL (ref 150–400)
RBC: 4.34 MIL/uL (ref 3.87–5.11)
RDW: 12.7 % (ref 11.5–15.5)
WBC: 6.6 10*3/uL (ref 4.0–10.5)

## 2014-02-22 LAB — GLUCOSE, CAPILLARY
Glucose-Capillary: 105 mg/dL — ABNORMAL HIGH (ref 70–99)
Glucose-Capillary: 91 mg/dL (ref 70–99)
Glucose-Capillary: 125 mg/dL — ABNORMAL HIGH (ref 70–99)

## 2014-02-22 LAB — HEMOGLOBIN A1C
Hgb A1c MFr Bld: 6 % — ABNORMAL HIGH (ref <5.7)
Mean Plasma Glucose: 126 mg/dL — ABNORMAL HIGH (ref <117)

## 2014-02-22 LAB — BASIC METABOLIC PANEL
Anion gap: 10 (ref 5–15)
BUN: 15 mg/dL (ref 6–23)
CO2: 27 mEq/L (ref 19–32)
Calcium: 8.4 mg/dL (ref 8.4–10.5)
Chloride: 107 mEq/L (ref 96–112)
Creatinine, Ser: 0.87 mg/dL (ref 0.50–1.10)
GFR calc Af Amer: 85 mL/min — ABNORMAL LOW (ref >90)
GFR calc non Af Amer: 74 mL/min — ABNORMAL LOW (ref >90)
Glucose, Bld: 108 mg/dL — ABNORMAL HIGH (ref 70–99)
Potassium: 3.7 mEq/L (ref 3.7–5.3)
Sodium: 144 mEq/L (ref 137–147)

## 2014-02-22 LAB — FOLATE RBC: RBC Folate: 687 ng/mL — ABNORMAL HIGH (ref >280)

## 2014-02-22 NOTE — Plan of Care (Signed)
Problem: Phase I Progression Outcomes Goal: OOB as tolerated unless otherwise ordered Outcome: Completed/Met Date Met:  02/22/14     

## 2014-02-22 NOTE — Plan of Care (Signed)
Problem: Phase II Progression Outcomes Goal: Obtain order to discontinue catheter if appropriate Outcome: Completed/Met Date Met:  02/22/14     

## 2014-02-22 NOTE — Plan of Care (Signed)
Problem: Phase I Progression Outcomes Goal: Pain controlled with appropriate interventions Outcome: Completed/Met Date Met:  02/22/14     

## 2014-02-22 NOTE — Progress Notes (Signed)
PT Cancellation Note  Patient Details Name: Harvel QualeBeverly Childers MRN: [id number] DOB: 01/15/1961   Cancelled Treatment:    Reason Eval/Treat Not Completed: Medical issues which prohibited therapy (resting HR 140)   Curran Lenderman 02/22/2014, 2:37 PM

## 2014-02-22 NOTE — Plan of Care (Signed)
Problem: Phase I Progression Outcomes Goal: Voiding-avoid urinary catheter unless indicated Outcome: Completed/Met Date Met:  02/22/14 Foley d/c pt has urinated

## 2014-02-22 NOTE — Progress Notes (Addendum)
Pt transfer from 18M via WC. Heart monitor place on pt, pt oriented to self and place. Bed alarm place on pt, will continue to monitor.

## 2014-02-22 NOTE — Progress Notes (Signed)
PULMONARY / CRITICAL CARE MEDICINE   Name: Amy Casey MRN: [id number] DOB: 01/14/1961    ADMISSION DATE:  02/19/2014 CONSULTATION DATE:  02/19/2014  REFERRING MD:  EDP  CHIEF COMPLAINT:  Altered mental status  INITIAL PRESENTATION: 53 y.o. female with significant psych history (schizophrenia) presents with 2 weeks progressive altered mental status with URI symptoms.   STUDIES/SIGNIFICANT EVENTS: 11/25 admitted, intubated for airway protection. LP and CT head negative 11/25 CT Head negative 11/25 UDS: +benzos 11/25 LP performed - CSF: Glucose 79, protein 34. 0-1 WBCs, no organisms. All cultures negative 11/26 extubated 11/27  Remained very somnolent. Neuro consult obtained. MRI and EEG ordered 11/27 MRI brain: negative 11/27 EEG: normal 11/28 remains somewhat somnolent but conversant and oriented to person and place Transferred to telemetry bed (to continue to monitor QT interval). TRH to assume care as off 11/29   SUBJECTIVE:  Less somnolent. No distress. Oriented to person, place. Slow to respond  VITAL SIGNS: Temp:  [98.4 F (36.9 C)-98.6 F (37 C)] 98.4 F (36.9 C) (11/28 0731) Pulse Rate:  [70-97] 86 (11/28 1000) Resp:  [11-28] 12 (11/28 1000) BP: (107-157)/(71-100) 128/71 mmHg (11/28 1000) SpO2:  [99 %-100 %] 100 % (11/28 1000) HEMODYNAMICS:   VENTILATOR SETTINGS:   INTAKE / OUTPUT:  Intake/Output Summary (Last 24 hours) at 02/22/14 1032 Last data filed at 02/22/14 1000  Gross per 24 hour  Intake   2250 ml  Output   1365 ml  Net    885 ml    PHYSICAL EXAMINATION: General:  NAD Neuro:  RASS -1 HEENT:  Poor oral hygiene, gingival hypertrophy Cardiovascular:  Reg, noM Lungs:  Clear bilaterally Abdomen:  Soft, nontender, bowel sounds present Musculoskeletal:  Intact, no edema   LABS: I have reviewed all of today's lab results. Relevant abnormalities are discussed in the A/P section  No new cxr  ASSESSMENT / PLAN:  NEUROLOGIC A:   Acute  encephalopathy - suspect adverse effect of medications P:   RASS goal: 0 Cont to monitor off medications  PULMONARY A: Acute respiratory failure due to AMS, resolved P:   Supp O2 as needed to maintain SpO2 > 92%  CARDIOVASCULAR A: Prolonged QTc, improving Minimal elevation of Trop I (0.96 on 11/26) - does not warrant aggressive eval presently P:  Cont tele  RENAL A:   AKI, resolved Mild rhabdomyolysis (CK peak 8358 11/26) - improving Hypernatremia, improving Hypokalemia, resolved P:   Monitor BMET intermittently Monitor I/Os Correct electrolytes as indicated   GASTROINTESTINAL A:   Mild elevation transaminases Malnutrition P:   Monitor LFTs Diet as tolerated with assistance  HEMATOLOGIC A:   No issues P:  Monitor cbc intermittently DVT prophylaxis: Lovenox  INFECTIOUS A:   Positive blood culture - 1/2 GPC clusters 11/25. Likely contaminant P:   BCx2 11/25>>> 1/2 GPC clusters >>  BCx2 11/27>>> UC 11/26>> NEG CSF culture 11/25>> NEG  Abx:  Ampicillin 11/25>>11/26 Acyclovir 11/26>>11/26 Ceftriaxone 11/25>>11/25 Vancomycin 11/25 >> 11/26, 11/27 >>    ENDOCRINE A:   Hyperglycemia, resolved P:   DC CBGs    TODAY'S SUMMARY:   Transfer to tele 11/28. TRH to assume 11/29 and PCCM to sign off. Discussed with Dr Nichola SizerMcClung    Saara Kijowski, MD;  PCCM service; Mobile [phone]

## 2014-02-23 DIAGNOSIS — R410 Disorientation, unspecified: Secondary | ICD-10-CM

## 2014-02-23 LAB — CSF CULTURE W GRAM STAIN: Culture: NO GROWTH

## 2014-02-23 LAB — CK: Total CK: 1778 U/L — ABNORMAL HIGH (ref 7–177)

## 2014-02-23 NOTE — Progress Notes (Signed)
Nsg Discharge Note  Admit Date:  02/19/2014 Discharge date: 02/23/2014   Amy Casey to be D/C'd Home per MD order.  AVS completed.  Copy for chart, and copy for patient signed, and dated. Patient/caregiver able to verbalize understanding.  Discharge Medication:   Medication List    STOP taking these medications        ARIPiprazole 15 MG tablet  Commonly known as:  ABILIFY     benztropine 2 MG tablet  Commonly known as:  COGENTIN     clonazePAM 1 MG tablet  Commonly known as:  KLONOPIN     FLUoxetine 20 MG tablet  Commonly known as:  PROZAC      TAKE these medications        amLODipine 5 MG tablet  Commonly known as:  NORVASC  Take 1 tablet (5 mg total) by mouth daily.     lisinopril-hydrochlorothiazide 20-12.5 MG per tablet  Commonly known as:  ZESTORETIC  Take 1 tablet by mouth daily.     potassium chloride SA 20 MEQ tablet  Commonly known as:  K-DUR,KLOR-CON  Take 1 tablet (20 mEq total) by mouth 2 (two) times daily.        Discharge Assessment: Filed Vitals:   02/23/14 0633  BP: 140/74  Pulse: 77  Temp: 98.2 F (36.8 C)  Resp: 18   Skin clean, dry and intact without evidence of skin break down, no evidence of skin tears noted. IV catheter discontinued intact. Site without signs and symptoms of complications - no redness or edema noted at insertion site, patient denies c/o pain - only slight tenderness at site.  Dressing with slight pressure applied.  D/c Instructions-Education: Discharge instructions given to patient/family with verbalized understanding. D/c education completed with patient/family including follow up instructions, medication list, d/c activities limitations if indicated, with other d/c instructions as indicated by MD - patient able to verbalize understanding, all questions fully answered. Patient instructed to return to ED, call 911, or call MD for any changes in condition.  Patient escorted via WC, and D/C home via private  auto.  Gatlin Kittell Consuella Loselaine, RN 02/23/2014 3:24 PM

## 2014-02-23 NOTE — Care Management Note (Signed)
    Page 1 of 1   02/23/2014     11:38:41 AM CARE MANAGEMENT NOTE 02/23/2014  Patient:  Harvel QualeICKARD,Keatyn   Account Number:  [id number]  Date Initiated:  02/23/2014  Documentation initiated by:  [facility]FF[facility],Brandilyn Nanninga  Subjective/Objective Assessment:   ZOX:[id number]:altered mental status       Action/Plan:   discharge planning   Anticipated DC Date:  02/23/2014   Anticipated DC Plan:  HOME/SELF CARE         Choice offered to / List presented to:             Status of service:  Completed, signed off Medicare Important Message given?   (If response is "NO", the following Medicare IM given date fields will be blank) Date Medicare IM given:   Medicare IM given by:   Date Additional Medicare IM given:   Additional Medicare IM given by:    Discharge Disposition:  HOME/SELF CARE  Per UR Regulation:    If discussed at Long Length of Stay Meetings, dates discussed:    Comments:  02/23/14 11:30 Cm received call from RN who states pt may be ordered HHPT and a DME order for a cane.  Cm spoke with pt while MD in the room and pt has refused both HHPT/cane and neither will be ordered.  cm notified RN. No other CM needs were communicated.  Freddy JakschSarah Malijah Lietz, BSN, CM [phone].

## 2014-02-23 NOTE — Progress Notes (Signed)
NEURO HOSPITALIST PROGRESS NOTE   SUBJECTIVE:                                                                                                                        Feels back to baseline, mild lightheadedness. MRI brain was unremarkable. EEG normal.    OBJECTIVE:                                                                                                                           Vital signs in last 24 hours: Temp:  [98.2 F (36.8 C)-98.7 F (37.1 C)] 98.2 F (36.8 C) (11/29 16100633) Pulse Rate:  [77-92] 77 (11/29 0633) Resp:  [12-21] 18 (11/29 0633) BP: (118-157)/(71-84) 140/74 mmHg (11/29 0633) SpO2:  [100 %] 100 % (11/29 96040633)  Intake/Output from previous day: 11/28 0701 - 11/29 0700 In: 680 [P.O.:240; I.V.:240; IV Piggyback:200] Out: 300 [Urine:300] Intake/Output this shift:   Nutritional status: Diet heart healthy/carb modified  Past Medical History  Diagnosis Date  . History of renal stone   . Wears glasses   . Hypertension   . Depression     Dr. Mila HomerSena, [facility]; hospitalization 07/2010  . Obesity   . History of echocardiogram 2011    [facility]     Physical exam: pleasant female in no apparent distress. Head: normocephalic. Neck: supple, no bruits, no JVD. Cardiac: no murmurs. Lungs: clear. Abdomen: soft, no tender, no mass. Extremities: no edema.   Neurologic Exam:  General: Mental Status: Alert, oriented, thought content appropriate.  Speech fluent without evidence of aphasia.  Able to follow 3 step commands without difficulty. Cranial Nerves: II: Discs flat bilaterally; Visual fields grossly normal, pupils equal, round, reactive to light and accommodation III,IV, VI: ptosis not present, extra-ocular motions intact bilaterally V,VII: smile symmetric, facial light touch sensation normal bilaterally VIII: hearing normal bilaterally IX,X: gag reflex present XI: bilateral shoulder shrug XII: midline tongue  extension without atrophy or fasciculations Motor: Right : Upper extremity   5/5    Left:     Upper extremity   5/5  Lower extremity   5/5     Lower extremity   5/5 Tone and bulk:normal tone throughout; no atrophy noted Sensory: Pinprick and light touch intact throughout,  bilaterally Deep Tendon Reflexes:  Right: Upper Extremity   Left: Upper extremity   biceps (C-5 to C-6) 2/4   biceps (C-5 to C-6) 2/4 tricep (C7) 2/4    triceps (C7) 2/4 Brachioradialis (C6) 2/4  Brachioradialis (C6) 2/4  Lower Extremity Lower Extremity  quadriceps (L-2 to L-4) 2/4   quadriceps (L-2 to L-4) 2/4 Achilles (S1) 2/4   Achilles (S1) 2/4  Plantars: Right: downgoing   Left: downgoing Cerebellar: normal finger-to-nose,  normal heel-to-shin test Gait:  No ataxia.    Lab Results: Lab Results  Component Value Date/Time   CHOL 180 02/10/2012 11:35 AM   Lipid Panel No results for input(s): CHOL, TRIG, HDL, CHOLHDL, VLDL, LDLCALC in the last 72 hours.  Studies/Results: Mr Brain Wo Contrast  02/21/2014   CLINICAL DATA:  Altered mental status  EXAM: MRI HEAD WITHOUT CONTRAST  TECHNIQUE: Multiplanar, multiecho pulse sequences of the brain and surrounding structures were obtained without intravenous contrast.  COMPARISON:  CT head 02/19/2014.  FINDINGS: Image quality degraded by mild motion  Mild cerebral atrophy.  Negative for hydrocephalus  None negative for acute infarct.  Patchy hyperintensity throughout the cerebral white matter bilaterally. This is most consistent with chronic microvascular ischemia. Brainstem and cerebellum intact  Negative for hemorrhage or fluid collection  Negative for mass or edema.  Mild mucosal edema paranasal sinuses bilaterally.  IMPRESSION: Mild atrophy. Moderate white matter changes most consistent with microvascular ischemia  Negative for acute infarct.   Electronically Signed   By: Marlan Palauharles  Clark M.D.   On: 02/21/2014 17:57    MEDICATIONS                                                                                                                         Scheduled: . antiseptic oral rinse  7 mL Mouth Rinse QID  . chlorhexidine  15 mL Mouth Rinse BID  . Chlorhexidine Gluconate Cloth  6 each Topical Q0600  . enoxaparin (LOVENOX) injection  40 mg Subcutaneous Q24H  . mupirocin ointment  1 application Nasal BID  . vancomycin  1,000 mg Intravenous Q12H    ASSESSMENT/PLAN:                                                                                                           Probable medication related encephalopathy, resolved.  No further inpatient neurological intervention required. Needs follow up by psych as outpatient in order to resume her psych med Neurology will sign off.  Wyatt Portelasvaldo Camilo, MD [facility] [phone]  02/23/2014, 9:00 AM

## 2014-02-23 NOTE — Evaluation (Signed)
Physical Therapy Evaluation Patient Details Name: Amy Casey MRN: [id number] DOB: 06/19/1960 Today'Casey Date: 02/23/2014   History of Present Illness  53 y.o. female with significant psych history (schizophrenia) presents with progressive altered mental status with URI symptoms. Intubated 11/25, extubated 11/26.  Clinical Impression  Pt admitted with the above complications. Pt currently with functional limitations due to the deficits listed below (see PT Problem List). Pt furniture walking and holding rail in hallway due to difficulty with balance but declines using an assistive device for balance. Notable Lt foot drag while ambulating and 3+/5 strength in Lt ankle dorsiflexors and great toe extension; she does not recall this being an issue prior to admission. Normal light touch sensation and proprioception of LLE. States she will have 24 hour care at home. Pt will benefit from skilled PT to increase their independence and safety with mobility to allow discharge to the venue listed below.     Follow Up Recommendations Home health PT;Supervision for mobility/OOB    Equipment Recommendations  Cane (Pt refuses although highly recommend.)    Recommendations for Other Services OT consult     Precautions / Restrictions Precautions Precautions: Fall Restrictions Weight Bearing Restrictions: No      Mobility  Bed Mobility Overal bed mobility: Modified Independent                Transfers Overall transfer level: Needs assistance Equipment used: None Transfers: Sit to/from Stand Sit to Stand: Min guard         General transfer comment: Min guard for safety. Slow to rise. mildy unstable but able to self correct.  Ambulation/Gait Ambulation/Gait assistance: Min guard Ambulation Distance (Feet): 115 Feet Assistive device: None (Wall-rail) Gait Pattern/deviations: Step-through pattern;Decreased stride length;Decreased dorsiflexion - left;Decreased weight shift to  left;Drifts right/left   Gait velocity interpretation: Below normal speed for age/gender General Gait Details: Close guard for safety with mild balance difficulty. Declines ambulating with an assistive device. furniture walking in room and holds rail in hall intermittently. Of note pt with mild left foot drag, lack of heel strike. Slow and guarded and drifts to left and Rt without holding rail or device. Cues for Lt foot clearance.  Stairs            Wheelchair Mobility    Modified Rankin (Stroke Patients Only)       Balance Overall balance assessment: Needs assistance Sitting-balance support: No upper extremity supported;Feet supported Sitting balance-Leahy Scale: Good     Standing balance support: No upper extremity supported Standing balance-Leahy Scale: Fair                               Pertinent Vitals/Pain Pain Assessment: 0-10 Pain Score: 7  Pain Location: abdomen Pain Descriptors / Indicators: Dull Pain Intervention(Casey): Repositioned;Limited activity within patient'Casey tolerance;Monitored during session  HR 92-122 beats per minute    Home Living Family/patient expects to be discharged to:: Private residence Living Arrangements: Children;Other relatives (Mother-in-lawl) Available Help at Discharge: Family;Available 24 hours/day Type of Home: House Home Access: Stairs to enter Entrance Stairs-Rails: Doctor, general practiceight;Left Entrance Stairs-Number of Steps: 5-6 Home Layout: One level Home Equipment: None      Prior Function Level of Independence: Independent               Hand Dominance   Dominant Hand: Right    Extremity/Trunk Assessment   Upper Extremity Assessment: Defer to OT evaluation  Lower Extremity Assessment: LLE deficits/detail   LLE Deficits / Details: Lt dorsiflexion and great toe extension 3+/5. Normal proprioception and light touch sensation. Reports tenderness with palpation over muscle belly of tibialis anterior.      Communication   Communication: No difficulties  Cognition Arousal/Alertness: Awake/alert Behavior During Therapy: Flat affect Overall Cognitive Status: Within Functional Limits for tasks assessed                      General Comments General comments (skin integrity, edema, etc.): Weakness of left dorsiflexors and great toe with TTP over tibialis anterior muscle belly. reports normal sensation when tested for light touch. Good UE coordination and strength. RLE unremarkable normal strength.    Exercises General Exercises - Lower Extremity Ankle Circles/Pumps: AROM;Strengthening;Left;10 reps;Seated      Assessment/Plan    PT Assessment Patient needs continued PT services  PT Diagnosis Difficulty walking;Abnormality of gait;Acute pain   PT Problem List Decreased strength;Decreased range of motion;Decreased activity tolerance;Decreased balance;Decreased mobility;Decreased knowledge of use of DME;Decreased safety awareness;Decreased knowledge of precautions;Pain  PT Treatment Interventions DME instruction;Gait training;Stair training;Functional mobility training;Therapeutic activities;Therapeutic exercise;Balance training;Neuromuscular re-education;Patient/family education;Cognitive remediation;Modalities   PT Goals (Current goals can be found in the Care Plan section) Acute Rehab PT Goals Patient Stated Goal: Go home today PT Goal Formulation: With patient Time For Goal Achievement: 03/09/14 Potential to Achieve Goals: Good    Frequency Min 3X/week   Barriers to discharge        Co-evaluation               End of Session Equipment Utilized During Treatment: Gait belt Activity Tolerance: Patient tolerated treatment well Patient left: in bed;with call bell/phone within reach;with bed alarm set (EOB) Nurse Communication: Mobility status;Other (comment) (Lt foot drop)         Time: [id number] PT Time Calculation (min) (ACUTE ONLY): 29 min   Charges:   PT  Evaluation $Initial PT Evaluation Tier I: 1 Procedure PT Treatments $Gait Training: 8-22 mins   PT G Codes:          Amy Casey, Amy Casey 02/23/2014, 10:22 AM  Amy Casey, PT [phone]

## 2014-02-23 NOTE — Plan of Care (Signed)
Problem: Phase I Progression Outcomes Goal: Initial discharge plan identified Outcome: Progressing Goal: Hemodynamically stable Outcome: Progressing Goal: Other Phase I Outcomes/Goals Outcome: Not Applicable Date Met:  21/82/88

## 2014-02-23 NOTE — Discharge Summary (Signed)
Physician Discharge Summary       Patient ID: Amy Casey MRN: [id number] DOB/AGE: 55/09/1960 55 y.o.  Admit date: 02/19/2014 Discharge date: 02/23/2014  Discharge Diagnoses:  Acute respiratory failure (resolved) Prolonged QTc interval AKI (resolved) Abnormal troponin Mild Rhabdomyolysis (resolved) Hypernatremia (resolved)  Abnormal LFTs Acute encephalopathy (felt due to polypharmacy)  Detailed Hospital Course:  53 year old female who was admitted on 11/25 after being found with altered mental status by husband.She has a history of schizophrenia but hasn't been seen by her psychiatrist in over a year. She stopped taking her medications about 5-6 months ago. She has had multiple admission to behavioral health. One prior admission for involuntary commit for possible self harm. Family denies that she would cause self harm. She has prescriptions for benztropine, klonopin and Abilify but these are from 2013. Family denies any alcohol, illicit drug use or tobacco. She was intubated by EDP for airway protection.  Significant events of her hospital course are as follows: 11/25 admitted, intubated for airway protection. LP and CT head negative 11/25 CT Head: negative 11/25 UDS: +benzos 11/25 LP performed - CSF: Glucose 79, protein 34. 0-1 WBCs, no organisms. All cultures negative 11/26 extubated 11/27 Remained very somnolent. Neuro consult obtained. MRI and EEG ordered 11/27 MRI brain: negative 11/27 EEG: normal 11/28 remains somewhat somnolent but conversant and oriented to person and place Transferred to telemetry bed (to continue to monitor QT interval).  As of 11/29 she is fully awake. Has been seen by Neurology she feels indeed that her acute encephalopathy was medication induced. She is cleared for discharge from neuro standpoint. She needs to follow up with psychiatry after discharge to discus the meds that she resumes.    Discharge Plan by active problems  Acute encephalopathy:  probably medication related.  Plan: Needs follow up by psych as outpatient in order to resume her psych med She states she has planned appointment w/ Dr Ezzard Flaxarol Sena at [facility] for 12/1  H/o HTN and just resolved AKI in setting of Rhabdo:  Plan Needs BP check and chemistry 1 week after d/c    Significant Hospital tests/ studies  Consults:neurology   Discharge Exam: BP 140/74 mmHg  Pulse 77  Temp(Src) 98.2 F (36.8 C) (Oral)  Resp 18  Ht 5\' 7"  (1.702 m)  Wt 78.6 kg (173 lb 4.5 oz)  BMI 27.13 kg/m2  SpO2 100% General: NAD Neuro: no focal def  HEENT: Poor oral hygiene, gingival hypertrophy Cardiovascular: Reg, noM Lungs: Clear bilaterally Abdomen: Soft, nontender, bowel sounds present Musculoskeletal: Intact, no edema  Labs at discharge Lab Results  Component Value Date   CREATININE 0.87 02/22/2014   BUN 15 02/22/2014   NA 144 02/22/2014   K 3.7 02/22/2014   CL 107 02/22/2014   CO2 27 02/22/2014   Lab Results  Component Value Date   WBC 6.6 02/22/2014   HGB 12.8 02/22/2014   HCT 39.3 02/22/2014   MCV 90.6 02/22/2014   PLT 236 02/22/2014   Lab Results  Component Value Date   ALT 68* 02/21/2014   AST 133* 02/21/2014   ALKPHOS 50 02/21/2014   BILITOT 0.6 02/21/2014   No results found for: INR, PROTIME  Current radiology studies Mr Brain Wo Contrast  02/21/2014   CLINICAL DATA:  Altered mental status  EXAM: MRI HEAD WITHOUT CONTRAST  TECHNIQUE: Multiplanar, multiecho pulse sequences of the brain and surrounding structures were obtained without intravenous contrast.  COMPARISON:  CT head 02/19/2014.  FINDINGS: Image quality degraded  by mild motion  Mild cerebral atrophy.  Negative for hydrocephalus  None negative for acute infarct.  Patchy hyperintensity throughout the cerebral white matter bilaterally. This is most consistent with chronic microvascular ischemia. Brainstem and cerebellum intact  Negative for hemorrhage or fluid collection  Negative for  mass or edema.  Mild mucosal edema paranasal sinuses bilaterally.  IMPRESSION: Mild atrophy. Moderate white matter changes most consistent with microvascular ischemia  Negative for acute infarct.   Electronically Signed   By: Marlan Palauharles  Clark M.D.   On: 02/21/2014 17:57    Disposition:  01-Home or Self Care      Discharge Instructions    Diet - low sodium heart healthy    Complete by:  As directed      Increase activity slowly    Complete by:  As directed             Medication List    STOP taking these medications        ARIPiprazole 15 MG tablet  Commonly known as:  ABILIFY     benztropine 2 MG tablet  Commonly known as:  COGENTIN     clonazePAM 1 MG tablet  Commonly known as:  KLONOPIN     FLUoxetine 20 MG tablet  Commonly known as:  PROZAC      TAKE these medications        amLODipine 5 MG tablet  Commonly known as:  NORVASC  Take 1 tablet (5 mg total) by mouth daily.     lisinopril-hydrochlorothiazide 20-12.5 MG per tablet  Commonly known as:  ZESTORETIC  Take 1 tablet by mouth daily.     potassium chloride SA 20 MEQ tablet  Commonly known as:  K-DUR,KLOR-CON  Take 1 tablet (20 mEq total) by mouth 2 (two) times daily.       Follow-up Information    [facility]. Call on 02/23/2014.   Specialty:  Psychiatric    Why:  Call first thing on 11/30 to ensure you still have your appointment on 12/1      [facility] week     Discharged Condition: good  Physician Statement:   The Patient was personally examined, the discharge assessment and plan has been personally reviewed and I agree with ACNP Babcock's assessment and plan. > 30 minutes of time have been dedicated to discharge assessment, planning and discharge instructions.   Signed: BABCOCK,PETE 02/23/2014, 11:44 AM   ATTENDING: Agree with above She has improved dramatically since yesterday and cognition is back to her baseline. It seems increasingly evident that  her cognitive dysfunction on admission was all medication induced. It is our recommendation that psychotropic medications all be held until further outpt psych evaluation   Billy Fischeravid Tashala Cumbo, MD ; [facility] service Mobile [phone].  After 5:30 PM or weekends, call [phone]

## 2014-02-26 LAB — CULTURE, BLOOD (ROUTINE X 2): Culture: NO GROWTH

## 2014-02-27 LAB — CULTURE, BLOOD (ROUTINE X 2)
Culture: NO GROWTH
Culture: NO GROWTH

## 2014-10-02 ENCOUNTER — Encounter: Payer: Self-pay | Admitting: Medical

## 2014-10-02 ENCOUNTER — Ambulatory Visit (INDEPENDENT_AMBULATORY_CARE_PROVIDER_SITE_OTHER): Payer: BLUE CROSS/BLUE SHIELD | Admitting: Medical

## 2014-10-02 VITALS — BP 156/88 | HR 86 | Temp 98.2°F | Resp 16 | Wt 166.0 lb

## 2014-10-02 DIAGNOSIS — Z9119 Patient's noncompliance with other medical treatment and regimen: Secondary | ICD-10-CM | POA: Diagnosis not present

## 2014-10-02 DIAGNOSIS — N898 Other specified noninflammatory disorders of vagina: Secondary | ICD-10-CM | POA: Diagnosis not present

## 2014-10-02 DIAGNOSIS — I10 Essential (primary) hypertension: Secondary | ICD-10-CM | POA: Diagnosis not present

## 2014-10-02 DIAGNOSIS — F209 Schizophrenia, unspecified: Secondary | ICD-10-CM | POA: Diagnosis not present

## 2014-10-02 DIAGNOSIS — Z91199 Patient's noncompliance with other medical treatment and regimen due to unspecified reason: Secondary | ICD-10-CM

## 2014-10-02 LAB — POCT WET PREP (WET MOUNT)
KOH Wet Prep POC: NEGATIVE
Trichomonas Wet Prep HPF POC: NEGATIVE

## 2014-10-02 MED ORDER — BENZTROPINE MESYLATE 1 MG PO TABS: 1.0000 mg | ORAL_TABLET | Freq: Two times a day (BID) | ORAL | Status: DC

## 2014-10-02 MED ORDER — LISINOPRIL-HYDROCHLOROTHIAZIDE 20-12.5 MG PO TABS: 1.0000 | ORAL_TABLET | Freq: Every day | ORAL | Status: DC

## 2014-10-02 MED ORDER — ARIPIPRAZOLE 15 MG PO TABS: 15.0000 mg | ORAL_TABLET | Freq: Every day | ORAL | Status: DC

## 2014-10-02 MED ORDER — POTASSIUM CHLORIDE CRYS ER 20 MEQ PO TBCR: 20.0000 meq | EXTENDED_RELEASE_TABLET | Freq: Every day | ORAL | Status: DC

## 2014-10-02 MED ORDER — AMLODIPINE BESYLATE 5 MG PO TABS: 5.0000 mg | ORAL_TABLET | Freq: Every day | ORAL | Status: DC
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
## 2014-10-02 NOTE — Patient Instructions (Signed)
Please call and make an appointment with a psychiatrist - a mental healht doctor that prescirbed medications  I recommend one of the following:  [facility] [address] [place], [place] [address]  Phone: [phone]    Dr. Milagros Evenerupinder Kaur Address: [address], [place], [place] [address] [phone]

## 2014-10-03 ENCOUNTER — Other Ambulatory Visit: Payer: Self-pay | Admitting: Medical

## 2014-10-03 LAB — GC/CHLAMYDIA PROBE AMP
CT Probe RNA: NEGATIVE
GC Probe RNA: NEGATIVE

## 2014-10-03 MED ORDER — METRONIDAZOLE 500 MG PO TABS: 500.0000 mg | ORAL_TABLET | Freq: Two times a day (BID) | ORAL | Status: DC

## 2014-10-03 MED ORDER — FLUCONAZOLE 150 MG PO TABS: ORAL_TABLET | ORAL | Status: DC

## 2014-11-03 ENCOUNTER — Ambulatory Visit: Payer: BLUE CROSS/BLUE SHIELD | Admitting: Medical

## 2014-11-07 ENCOUNTER — Encounter: Payer: Self-pay | Admitting: Medical

## 2014-11-10 ENCOUNTER — Telehealth: Payer: Self-pay | Admitting: Medical

## 2014-11-10 NOTE — Telephone Encounter (Signed)
Pt called stating that she was not aware of her 11/03/14 appt. She was with her daughter who was having a baby and did not get the appt reminder message until after the appt. She does not have her copay to come in now so she will resched the appt later

## 2015-06-01 ENCOUNTER — Other Ambulatory Visit: Payer: Self-pay | Admitting: Medical

## 2015-06-01 NOTE — Telephone Encounter (Signed)
Is this ok to refill?  

## 2015-06-12 ENCOUNTER — Telehealth: Payer: Self-pay | Admitting: Medical

## 2015-06-12 MED ORDER — BENZTROPINE MESYLATE 1 MG PO TABS: 1.0000 mg | ORAL_TABLET | Freq: Two times a day (BID) | ORAL | Status: DC

## 2015-06-12 NOTE — Telephone Encounter (Signed)
Pt stopped by office and was somewhat distressed, she has an appt next week & needs refill on Cogentin. Advised her would refill, she was very relieved.  Ok refill per Dr. Susann GivensLalonde

## 2015-06-16 ENCOUNTER — Ambulatory Visit (INDEPENDENT_AMBULATORY_CARE_PROVIDER_SITE_OTHER): Payer: BLUE CROSS/BLUE SHIELD | Admitting: Medical

## 2015-06-16 ENCOUNTER — Encounter: Payer: BLUE CROSS/BLUE SHIELD | Admitting: Medical

## 2015-06-16 ENCOUNTER — Encounter (HOSPITAL_COMMUNITY): Payer: Self-pay | Admitting: Emergency Medicine

## 2015-06-16 ENCOUNTER — Telehealth: Payer: Self-pay

## 2015-06-16 ENCOUNTER — Emergency Department (HOSPITAL_COMMUNITY): Admission: EM | Admit: 2015-06-16 | Discharge: 2015-06-19 | Payer: Self-pay

## 2015-06-16 ENCOUNTER — Encounter: Payer: Self-pay | Admitting: Medical

## 2015-06-16 VITALS — BP 174/102 | HR 96

## 2015-06-16 DIAGNOSIS — F209 Schizophrenia, unspecified: Secondary | ICD-10-CM

## 2015-06-16 DIAGNOSIS — I10 Essential (primary) hypertension: Secondary | ICD-10-CM | POA: Diagnosis not present

## 2015-06-16 DIAGNOSIS — Z91199 Patient's noncompliance with other medical treatment and regimen due to unspecified reason: Secondary | ICD-10-CM

## 2015-06-16 DIAGNOSIS — R7301 Impaired fasting glucose: Secondary | ICD-10-CM | POA: Diagnosis not present

## 2015-06-16 DIAGNOSIS — Z87442 Personal history of urinary calculi: Secondary | ICD-10-CM | POA: Insufficient documentation

## 2015-06-16 DIAGNOSIS — F200 Paranoid schizophrenia: Secondary | ICD-10-CM | POA: Insufficient documentation

## 2015-06-16 DIAGNOSIS — E876 Hypokalemia: Secondary | ICD-10-CM | POA: Insufficient documentation

## 2015-06-16 DIAGNOSIS — E669 Obesity, unspecified: Secondary | ICD-10-CM | POA: Insufficient documentation

## 2015-06-16 DIAGNOSIS — Z9119 Patient's noncompliance with other medical treatment and regimen: Secondary | ICD-10-CM

## 2015-06-16 DIAGNOSIS — Z79899 Other long term (current) drug therapy: Secondary | ICD-10-CM | POA: Insufficient documentation

## 2015-06-16 DIAGNOSIS — Z046 Encounter for general psychiatric examination, requested by authority: Secondary | ICD-10-CM

## 2015-06-16 DIAGNOSIS — Z008 Encounter for other general examination: Secondary | ICD-10-CM | POA: Insufficient documentation

## 2015-06-16 LAB — CBC WITH DIFFERENTIAL/PLATELET
Basophils Absolute: 0 10*3/uL (ref 0.0–0.1)
Basophils Relative: 0 % (ref 0–1)
Eosinophils Absolute: 0.1 10*3/uL (ref 0.0–0.7)
Eosinophils Relative: 1 % (ref 0–5)
HCT: 42.8 % (ref 36.0–46.0)
Hemoglobin: 14.3 g/dL (ref 12.0–15.0)
Lymphocytes Relative: 31 % (ref 12–46)
Lymphs Abs: 2.5 10*3/uL (ref 0.7–4.0)
MCH: 28.9 pg (ref 26.0–34.0)
MCHC: 33.4 g/dL (ref 30.0–36.0)
MCV: 86.6 fL (ref 78.0–100.0)
MPV: 9.4 fL (ref 8.6–12.4)
Monocytes Absolute: 0.6 10*3/uL (ref 0.1–1.0)
Monocytes Relative: 7 % (ref 3–12)
Neutro Abs: 4.9 10*3/uL (ref 1.7–7.7)
Neutrophils Relative %: 61 % (ref 43–77)
Platelets: 414 10*3/uL — ABNORMAL HIGH (ref 150–400)
RBC: 4.94 MIL/uL (ref 3.87–5.11)
RDW: 13.8 % (ref 11.5–15.5)
WBC: 8 10*3/uL (ref 4.0–10.5)

## 2015-06-16 LAB — COMPREHENSIVE METABOLIC PANEL
ALT: 22 U/L (ref 14–54)
AST: 25 U/L (ref 15–41)
Albumin: 5 g/dL (ref 3.5–5.0)
Alkaline Phosphatase: 63 U/L (ref 38–126)
Anion gap: 13 (ref 5–15)
BUN: 18 mg/dL (ref 6–20)
CO2: 26 mmol/L (ref 22–32)
Calcium: 10 mg/dL (ref 8.9–10.3)
Chloride: 104 mmol/L (ref 101–111)
Creatinine, Ser: 0.92 mg/dL (ref 0.44–1.00)
GFR calc Af Amer: >60 mL/min (ref >60)
GFR calc non Af Amer: >60 mL/min (ref >60)
Glucose, Bld: 113 mg/dL — ABNORMAL HIGH (ref 65–99)
Potassium: 3.2 mmol/L — ABNORMAL LOW (ref 3.5–5.1)
Sodium: 143 mmol/L (ref 135–145)
Total Bilirubin: 0.9 mg/dL (ref 0.3–1.2)
Total Protein: 8.8 g/dL — ABNORMAL HIGH (ref 6.5–8.1)

## 2015-06-16 LAB — CBC
HCT: 47.1 % — ABNORMAL HIGH (ref 36.0–46.0)
Hemoglobin: 15.4 g/dL — ABNORMAL HIGH (ref 12.0–15.0)
MCH: 29.4 pg (ref 26.0–34.0)
MCHC: 32.7 g/dL (ref 30.0–36.0)
MCV: 89.9 fL (ref 78.0–100.0)
Platelets: 396 10*3/uL (ref 150–400)
RBC: 5.24 MIL/uL — ABNORMAL HIGH (ref 3.87–5.11)
RDW: 12.9 % (ref 11.5–15.5)
WBC: 9.1 10*3/uL (ref 4.0–10.5)

## 2015-06-16 LAB — ACETAMINOPHEN LEVEL: Acetaminophen (Tylenol), Serum: <10 ug/mL — ABNORMAL LOW (ref 10–30)

## 2015-06-16 LAB — TSH
TSH: 1.12 mIU/L
TSH: 1.514 u[IU]/mL (ref 0.350–4.500)

## 2015-06-16 LAB — SALICYLATE LEVEL: Salicylate Lvl: <4 mg/dL (ref 2.8–30.0)

## 2015-06-16 LAB — ETHANOL: Alcohol, Ethyl (B): <5 mg/dL (ref <5)

## 2015-06-16 MED ORDER — POTASSIUM CHLORIDE CRYS ER 20 MEQ PO TBCR: 20.0000 meq | EXTENDED_RELEASE_TABLET | Freq: Every day | ORAL | Status: DC

## 2015-06-16 MED ORDER — LISINOPRIL-HYDROCHLOROTHIAZIDE 20-12.5 MG PO TABS: 1.0000 | ORAL_TABLET | Freq: Every day | ORAL | Status: DC

## 2015-06-16 MED ORDER — AMLODIPINE BESYLATE 5 MG PO TABS: 5.0000 mg | ORAL_TABLET | Freq: Every day | ORAL | Status: DC

## 2015-06-16 MED ORDER — ONDANSETRON HCL 4 MG PO TABS: 4.0000 mg | ORAL_TABLET | Freq: Three times a day (TID) | ORAL | Status: DC | PRN

## 2015-06-16 MED ORDER — ARIPIPRAZOLE 15 MG PO TABS
15.0000 mg | ORAL_TABLET | Freq: Every day | ORAL | Status: DC
  Filled 2015-06-16 (×5): qty 1

## 2015-06-16 MED ORDER — BENZTROPINE MESYLATE 1 MG PO TABS
1.0000 mg | ORAL_TABLET | Freq: Two times a day (BID) | ORAL | Status: DC
  Administered 2015-06-16: 1 mg via ORAL
  Filled 2015-06-16 (×3): qty 1

## 2015-06-16 MED ORDER — HYDROCHLOROTHIAZIDE 12.5 MG PO CAPS
12.5000 mg | ORAL_CAPSULE | Freq: Every day | ORAL | Status: DC
  Administered 2015-06-17 – 2015-06-18 (×2): 12.5 mg via ORAL
  Filled 2015-06-16 (×5): qty 1

## 2015-06-16 MED ORDER — ACETAMINOPHEN 325 MG PO TABS: 650.0000 mg | ORAL_TABLET | ORAL | Status: DC | PRN

## 2015-06-16 MED ORDER — LISINOPRIL 20 MG PO TABS
20.0000 mg | ORAL_TABLET | Freq: Every day | ORAL | Status: DC
  Administered 2015-06-17 – 2015-06-18 (×2): 20 mg via ORAL
  Filled 2015-06-16 (×5): qty 1

## 2015-06-16 MED ORDER — AMLODIPINE BESYLATE 5 MG PO TABS
5.0000 mg | ORAL_TABLET | Freq: Every day | ORAL | Status: DC
  Administered 2015-06-17 – 2015-06-18 (×2): 5 mg via ORAL
  Filled 2015-06-16 (×5): qty 1

## 2015-06-16 MED ORDER — LORAZEPAM 1 MG PO TABS: 1.0000 mg | ORAL_TABLET | Freq: Three times a day (TID) | ORAL | Status: DC | PRN

## 2015-06-16 MED ORDER — POTASSIUM CHLORIDE CRYS ER 20 MEQ PO TBCR
20.0000 meq | EXTENDED_RELEASE_TABLET | Freq: Every day | ORAL | Status: DC
  Administered 2015-06-16 – 2015-06-19 (×4): 20 meq via ORAL
  Filled 2015-06-16 (×5): qty 1

## 2015-06-16 NOTE — ED Notes (Signed)
Pt has one personal belongings bag behind triage nursing station. 

## 2015-06-16 NOTE — Telephone Encounter (Signed)
error 

## 2015-06-16 NOTE — ED Notes (Signed)
Pt. To [facility] from ED ambulatory without difficulty, to room  . Report from RN. Pt. Is alert and oriented, warm and dry in no distress. Pt. Denies SI, HI, and AVH. Pt. Calm and cooperative. Pt. Made aware of security cameras and Q15 minute rounds. Pt. Encouraged to let Nursing staff know of any concerns or needs. 2200 medications given to pt. Pt. Refused BP medications and Abilify stating, "I took my blood pressure medications this morning. And I am not taking the Abilify I don't like the way it makes me feel." This writer advised patient to speak with doctor about Abilify. Patient states she has not ate today. This Clinical research associatewriter gave patient cheese, peanut butter, and saltine crackers since patient refused a sandwich. Writer gave much encouragement to eat food.

## 2015-06-16 NOTE — Progress Notes (Signed)
Subjective: Chief Complaint  Patient presents with  . off her medication    stopped taking her medications. is off balance. supposed to have a physical today but she is having a bad day so her hudband made her come here because she declined going to the hospital.    Here today with her husband.  Amy Casey has hx/o schizophrenia, hypertension, noncompliance.   Was supposed to have physical appt this afternoon, but she showed up this morning because husband is worried she isn't taking her medications.  He was going to take her to the hospital this mmorning but she refused.  He cconvincedher to come here.   She is contradicting hhusband'sstatements.  He told the CMA that she is in denial ccurrently not ccompliantwith medication.  I last saw her 09/2014 and at that time we restarted medication as she had not been here in over a year at that time, so she hasn't been good at compliance.  At that visit she was supposed to get a psychiatry appt, but never did this.   She states she is "feeling off."   hhusbandsays she is short with her words, doesn't seem to complete conversations, seems to be paranoid all the time, comments on people watching them and people trying to find things out about them.  When she is not herself she tends to stay at home, and her ddaughteris upset that she doesn't feel herself or safe to be spending time with her grandfather currently.   She is still working at [employer], but having some trouble ccompletingtasks.  She has been out of her medications for months.   She states she doesn't like to take her medications.   She denies HI/SI.  hhusbandand she deny any recent uunusualbehavior such as uunusualpurchases, no uunusualdecisions of great importance.  No other aggravating or relieving factors. No other complaint.  Past Medical History  Diagnosis Date  . History of renal stone   . Wears glasses   . Hypertension   . Depression     Dr. Mila HomerSena, [facility]; hospitalization 07/2010  .  Obesity   . History of echocardiogram 2011    [facility]  . Hypokalemia   . Schizophrenia (HCC)    ROS as in subjective   Objective: Chief Complaint  Patient presents with  . off her medication    stopped taking her medications. is off balance. supposed to have a physical today but she is having a bad day so her hudband made her come here because she declined going to the hospital.    General appearance: alert, no distress, WD/WN, AA female HEENT: normocephalic, sclerae anicteric, TMs pearly, nares patent, no discharge or erythema, pharynx normal Oral cavity: MMM, no lesions Neck: supple, no lymphadenopathy, no thyromegaly, no masses, no bruits, but there are venous jugular pulsations bilat Heart: RRR, normal S1, S2, no murmurs Lungs: CTA bilaterally, no wheezes, rhonchi, or rales Abdomen: +bs, soft, non tender, non distended, no masses, no hepatomegaly, no splenomegaly Pulses: 2+ symmetric, upper and lower extremities, normal cap refill Psych: flat affect, paucity of words, answers some questions with short responses but other times mumbles through answers.  Seems to be suspicious of my lab orders today and recommendations .   Depression screen PHQ 2/9 06/16/2015  Decreased Interest 1  Down, Depressed, Hopeless 1  PHQ - 2 Score 2  Altered sleeping 2  Tired, decreased energy 2  Change in appetite 1  Feeling bad or failure about yourself  3  Trouble concentrating 0  Moving slowly or fidgety/restless 0  Suicidal thoughts 2  PHQ-9 Score 12  Difficult doing work/chores Somewhat difficult    MMSE : 25/30 today    Assessment: Encounter Diagnoses  Name Primary?  . Schizophrenia, unspecified type (HCC) Yes  . Essential hypertension, benign   . Impaired fasting blood sugar   . Noncompliance     Plan: I am thankful husband brought her in.   We discussed her lack of compliance with both mental health and physical problems.  Restart BP medication, labs today.  She was paranoid and  wouldn't give [place] urine sample today.    reviewer PHQ9 and MMSE today.   She doesn't seem to be in eminate danger of self harm or harm to others but as a condition of today's visit I required them to be seen by psychiatry today, either through emergency dept or independent psychiatrist.   I personally called 5 psychiatry offices, and the earliest anybody would see her was late April.  She was discharged from [facility] due to noncompliance.   She will go to [facility] today downtown, husband will take her there once she leaves here today to get evaluated and back on medication.   I advised husband and her that she needs to see me every 3 months in general for routine f/u from now on, needs to be seeing psychiatry, and having husband be good about ensuring she makes these appointments as she can't be relied upon to make her f/u appointments.  Of note, she was hospitalized 01/2014 for non responsive, altered mental status.
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
## 2015-06-16 NOTE — ED Provider Notes (Signed)
CSN: [id number]     Arrival date & time 06/16/15  1854 History   First MD Initiated Contact with Patient 06/16/15 2125     Chief Complaint  Patient presents with  . IVC      (Consider location/radiation/quality/duration/timing/severity/associated sxs/prior Treatment) HPI  55 year old female who presents under involuntary commitment. She has a history of hypertension, depression, and schizophrenia. History is primarily provided by the patient's husband who states that she has stopped taking her Abilify one month ago. Patient says that she has been compliant with all of her medications. Husband states that over the past month she has had increasing paranoia, thinking that people are watching them and trying to gather information on them. She has been "shutting down" according to her husband, becoming more isolated, not eating or drinking. She has not had any suicidal or homicidal ideation, though does report feeling occasionally sad at times. Was initially seen at [facility], but sent to ED under IVC for psych evaluation.   Past Medical History  Diagnosis Date  . History of renal stone   . Wears glasses   . Hypertension   . Depression     Dr. Mila Homer, [facility]; hospitalization 07/2010  . Obesity   . History of echocardiogram 2011    [facility]  . Hypokalemia   . Schizophrenia [facility])    Past Surgical History  Procedure Laterality Date  . Wisdom tooth extraction     Family History  Problem Relation Age of Onset  . Diabetes Mother   . Kidney disease Mother     dialysis  . Heart disease Mother   . Cancer Maternal Uncle     lung  . Stroke Neg Hx   . Hypertension Neg Hx   . Hyperlipidemia Neg Hx    Social History  Substance Use Topics  . Smoking status: Never Smoker   . Smokeless tobacco: None  . Alcohol Use: No   OB History    No data available     Review of Systems    Allergies  Review of patient's allergies indicates no known allergies.  Home Medications   Prior to  Admission medications   Medication Sig Start Date End Date Taking? Authorizing Provider  amLODipine (NORVASC) 5 MG tablet Take 1 tablet (5 mg total) by mouth daily. 06/16/15  Yes Kermit Balo Tysinger, PA-C  benztropine (COGENTIN) 1 MG tablet Take 1 tablet (1 mg total) by mouth 2 (two) times daily. 06/12/15  Yes Ronnald Nian, MD  lisinopril-hydrochlorothiazide (ZESTORETIC) 20-12.5 MG tablet Take 1 tablet by mouth daily. 06/16/15  Yes Kermit Balo Tysinger, PA-C  potassium chloride SA (K-DUR,KLOR-CON) 20 MEQ tablet Take 1 tablet (20 mEq total) by mouth daily. 06/16/15  Yes Kermit Balo Tysinger, PA-C  ARIPiprazole (ABILIFY) 15 MG tablet Take 1 tablet (15 mg total) by mouth daily. Patient not taking: Reported on 06/16/2015 10/02/14   Kermit Balo Tysinger, PA-C  fluconazole (DIFLUCAN) 150 MG tablet 1 tablet po now, may repeat in 1 wk Patient not taking: Reported on 06/16/2015 10/03/14   Kermit Balo Tysinger, PA-C  metroNIDAZOLE (FLAGYL) 500 MG tablet Take 1 tablet (500 mg total) by mouth 2 (two) times daily. 4 tablets once, don't consume alcohol while taking the medication Patient not taking: Reported on 06/16/2015 10/03/14   Kermit Balo Tysinger, PA-C   BP 155/88 mmHg  Pulse 73  Temp(Src) 98.1 F (36.7 C) (Oral)  Resp 18  SpO2 99% Physical Exam Physical Exam  Nursing note and vitals reviewed. Constitutional:  Well developed, well nourished, non-toxic, and in no acute distress Head: Normocephalic and atraumatic.  Mouth/Throat: Oropharynx is clear and moist.  Neck: Normal range of motion. Neck supple.  Cardiovascular: Normal rate and regular rhythm.   Pulmonary/Chest: Effort normal and breath sounds normal.  Abdominal: Soft. There is no tenderness. There is no rebound and no guarding.  Musculoskeletal: Normal range of motion.  Neurological: Alert, no facial droop, fluent speech, moves all extremities symmetrically Skin: Skin is warm and dry.  Psychiatric: Cooperative  ED Course  Procedures (including critical care  time) Labs Review Labs Reviewed  COMPREHENSIVE METABOLIC PANEL - Abnormal; Notable for the following:    Potassium 3.2 (*)    Glucose, Bld 113 (*)    Total Protein 8.8 (*)    All other components within normal limits  ACETAMINOPHEN LEVEL - Abnormal; Notable for the following:    Acetaminophen (Tylenol), Serum <10 (*)    All other components within normal limits  CBC - Abnormal; Notable for the following:    RBC 5.24 (*)    Hemoglobin 15.4 (*)    HCT 47.1 (*)    All other components within normal limits  ETHANOL  SALICYLATE LEVEL  URINE RAPID DRUG SCREEN, HOSP PERFORMED  URINALYSIS, ROUTINE W REFLEX MICROSCOPIC (NOT AT [facility])  TSH    Imaging Review No results found. I have personally reviewed and evaluated these images and lab results as part of my medical decision-making.   EKG Interpretation None      MDM   Final diagnoses:  Involuntary commitment  Schizophrenia, unspecified type (HCC)    55 year old female with history of depression and schizophrenia who presents under IVC. She has stable vital signs, is not toxic, and in no acute distress. Exam overall unremarkable. She does not provide reliable history, often stopping sentences midway, or seems confused and often says I don't know the answers. No suicidal or homicidal thoughts. No major electrolyte or metabolic derangements. Does have a low potassium of 3.2 which she is given her home potassium supplementation. Tox screen is unremarkable, but UDS pending. Increasing paranoia, isolation, and decreased appetite seem likely due to noncompliance with antipsychotic medication. Medically cleared for TTS consultation.    Lavera Guiseana Duo Eyvette Cordon, MD 06/16/15 [id number]

## 2015-06-16 NOTE — ED Notes (Signed)
Pt has in belonging bag:  Black winter jacket, black shoes, black socks, black bra, black undershirt, blue jeans

## 2015-06-16 NOTE — ED Notes (Signed)
Pt aware that a urine sample was needed. Pt sts her first attempt to urinate was unsuccessful

## 2015-06-16 NOTE — ED Notes (Signed)
Pt BIB [facility] IVC.  IVC papers state: "Pt has hx of mental illness (schizophrenia, paranoid type), and has not been taking her medication (abilify).  Family reports pt not eating for many days, not attending ADLS, confused, paranoid.  Upon presentation, patient unable to provide specific details secondary to confusion.

## 2015-06-17 DIAGNOSIS — F209 Schizophrenia, unspecified: Secondary | ICD-10-CM

## 2015-06-17 DIAGNOSIS — Z046 Encounter for general psychiatric examination, requested by authority: Secondary | ICD-10-CM | POA: Insufficient documentation

## 2015-06-17 LAB — COMPREHENSIVE METABOLIC PANEL
ALT: 15 U/L (ref 6–29)
AST: 17 U/L (ref 10–35)
Albumin: 4.6 g/dL (ref 3.6–5.1)
Alkaline Phosphatase: 58 U/L (ref 33–130)
BUN: 16 mg/dL (ref 7–25)
CO2: 24 mmol/L (ref 20–31)
Calcium: 9.8 mg/dL (ref 8.6–10.4)
Chloride: 105 mmol/L (ref 98–110)
Creat: 0.76 mg/dL (ref 0.50–1.05)
Glucose, Bld: 99 mg/dL (ref 65–99)
Potassium: 3.6 mmol/L (ref 3.5–5.3)
Sodium: 143 mmol/L (ref 135–146)
Total Bilirubin: 0.7 mg/dL (ref 0.2–1.2)
Total Protein: 7.7 g/dL (ref 6.1–8.1)

## 2015-06-17 LAB — URINALYSIS, ROUTINE W REFLEX MICROSCOPIC
Glucose, UA: NEGATIVE mg/dL
Hgb urine dipstick: NEGATIVE
Ketones, ur: NEGATIVE mg/dL
Leukocytes, UA: NEGATIVE
Nitrite: NEGATIVE
Protein, ur: NEGATIVE mg/dL
Specific Gravity, Urine: 1.018 (ref 1.005–1.030)
pH: 5.5 (ref 5.0–8.0)

## 2015-06-17 LAB — RAPID URINE DRUG SCREEN, HOSP PERFORMED
Amphetamines: NOT DETECTED
Barbiturates: NOT DETECTED
Benzodiazepines: NOT DETECTED
Cocaine: NOT DETECTED
Opiates: NOT DETECTED
Tetrahydrocannabinol: NOT DETECTED

## 2015-06-17 LAB — HEMOGLOBIN A1C
Hgb A1c MFr Bld: 5.7 % — ABNORMAL HIGH (ref <5.7)
Mean Plasma Glucose: 117 mg/dL — ABNORMAL HIGH (ref <117)

## 2015-06-17 MED ORDER — ENSURE ENLIVE PO LIQD
237.0000 mL | Freq: Three times a day (TID) | ORAL | Status: DC
  Administered 2015-06-19: 237 mL via ORAL
  Filled 2015-06-17 (×6): qty 237

## 2015-06-17 MED ORDER — CLONIDINE HCL 0.1 MG PO TABS
0.1000 mg | ORAL_TABLET | Freq: Once | ORAL | Status: AC
  Administered 2015-06-17: 0.1 mg via ORAL
  Filled 2015-06-17: qty 1

## 2015-06-17 NOTE — ED Notes (Signed)
Patient paranoid and guarded, but is cooperative with care. Pt refusing to eat and is also refusing to drink Ensure as ordered for nutrition. Pt with visitors at bedside. No s/s of distress noted at this time.

## 2015-06-17 NOTE — ED Notes (Signed)
Pt bp 191/107  MD made aware. MD states to give lisinopril and HCTZ now and to recheck BP in 1.5 hours.

## 2015-06-17 NOTE — BHH Counselor (Signed)
Patient referral faxed to the following facilities for review:  [facility]   [facility] Therapeutic Triage Specialist [facility] 06/17/2015 6:02 AM

## 2015-06-17 NOTE — BH Assessment (Signed)
BHH Assessment Progress Note  The following facilities have been contacted to seek placement for this pt, with results as noted:  Beds available, information sent, decision pending:  [name]   At capacity:   [facility]   Amy Witczak, MA Triage Specialist [phone]     

## 2015-06-17 NOTE — ED Notes (Signed)
Patient has refused all meals today.  She took her blood pressure medicines this morning but refused all psych meds.  Her affect is flat and she speaks only when spoken to.  Has some thought blocking, is very slow to answer questions and typically starts to answer and is unable to complete her sentences.  Sister states that she was really bad a few years ago and by the time they got her to the hospital she ended up in ICU, but states it started the same way in that the patient stopped eating and drinking, stopped medications and stopped talking to family.  She did finally sign a release for us to speak to sister and husband.  She has also not been attending to hygiene.  Sister was able to get her into the shower and stayed in the bathroom with her while she cleaned up.

## 2015-06-17 NOTE — Consult Note (Signed)
[place] Psychiatry Consult   Reason for Consult:  medication non compliant with medication, poor personal care, insonia.  Referring Physician:  EDP Patient Identification: Amy Casey MRN:  [id number] Principal Diagnosis: Schizophrenia [facility]) Diagnosis:   Patient Active Problem List   Diagnosis Date Noted  . Schizophrenia ([place]) [F20.9] 06/16/2015    Priority: High  . Noncompliance [Z91.19] 06/16/2015  . Altered awareness, transient [R40.4]   . Altered mental status [R41.82] 02/19/2014  . Essential hypertension, benign [I10] 02/10/2012  . Tooth decay [K02.9] 02/10/2012  . Obesity [E66.9] 02/10/2012  . Impaired fasting blood sugar [R73.01] 02/10/2012    Total Time spent with patient: 45 minutes  Subjective:   Amy Casey is a 55 y.o. female patient admitted with medication non compliant with medication, poor personal care, insonia.  HPI:  AA female, 55 years old was brought in under IVC taken out by [facility] for confusion, medication non compliant and not eating or bathing.  Patient has a a hx of Schizophrenia and states she stopped taking her medications for an unknown period of time.  Patient states that she topped her medications because they all made her feel funny.  Patient did not want to participate in the interview and had thought blocking.  Patient reports seeing her PMD DR Audelia Acton who advised her to seek [place] care from a Psychiatrist.  It is documented that patient is not eating or drinking and is not taking care of her ADLS.  Patient reports poor sleep and appetite.  She has been accepted for admission and we will be seeking placement at any facility with available bed.  Past Psychiatric History:  Schizophrenia  Risk to Self: Suicidal Ideation: No Suicidal Intent: No Is patient at risk for suicide?: No Suicidal Plan?: No Access to Means: No What has been your use of drugs/alcohol within the last 12 months?: Denies How many times?Marland Kitchen  Myer Haff) Other Self Harm Risks:  Denies Triggers for Past Attempts: Unknown Intentional Self Injurious Behavior: None Risk to Others: Homicidal Ideation: No Thoughts of Harm to Others: No Current Homicidal Intent: No Current Homicidal Plan: No Access to Homicidal Means: No Identified Victim: Denies History of harm to others?: No Assessment of Violence: None Noted Violent Behavior Description: Denies Does patient have access to weapons?: No Criminal Charges Pending?: No Does patient have a court date: Yes Court Date: 07/13/15 ("a ticket") Prior Inpatient Therapy: Prior Inpatient Therapy: Yes Prior Therapy Dates: 12/2008,11/2009,06/2010 Prior Therapy Facilty/Provider(s): [facility] Prior Outpatient Therapy: Prior Outpatient Therapy: Yes Prior Therapy Dates: "2 years ago" Prior Therapy Facilty/Provider(s): [facility] Reason for Treatment: Depression Does patient have an ACCT team?: No Does patient have Intensive In-House Services?  : No Does patient have [facility] services? : No Does patient have P4CC services?: No  Past Medical History:  Past Medical History  Diagnosis Date  . History of renal stone   . Wears glasses   . Hypertension   . Depression     Dr. Silvio Pate, [place]; hospitalization 07/2010  . Obesity   . History of echocardiogram 2011    [facility]  . Hypokalemia   . Schizophrenia [facility]     Past Surgical History  Procedure Laterality Date  . Wisdom tooth extraction     Family History:  Family History  Problem Relation Age of Onset  . Diabetes Mother   . Kidney disease Mother     dialysis  . Heart disease Mother   . Cancer Maternal Uncle     lung  . Stroke  Neg Hx   . Hypertension Neg Hx   . Hyperlipidemia Neg Hx    Family Psychiatric  History:  Unable to obtain Social History:  History  Alcohol Use No     History  Drug Use No    Social History   Social History  . Marital Status: Married    Spouse Name: N/A  . Number of Children: N/A  . Years of Education: N/A   Social History  Main Topics  . Smoking status: Never Smoker   . Smokeless tobacco: None  . Alcohol Use: No  . Drug Use: No  . Sexual Activity: Not Asked   Other Topics Concern  . None   Social History Narrative   Married, 2 children, mother in law lives with them, was working as a Horticulturist, commercial Social History:    Allergies:  No Known Allergies  Labs:  Results for orders placed or performed during the hospital encounter of 06/16/15 (from the past 48 hour(s))  Comprehensive metabolic panel     Status: Abnormal   Collection Time: 06/16/15  8:09 PM  Result Value Ref Range   Sodium 143 135 - 145 mmol/L   Potassium 3.2 (L) 3.5 - 5.1 mmol/L   Chloride 104 101 - 111 mmol/L   CO2 26 22 - 32 mmol/L   Glucose, Bld 113 (H) 65 - 99 mg/dL   BUN 18 6 - 20 mg/dL   Creatinine, Ser 0.92 0.44 - 1.00 mg/dL   Calcium 10.0 8.9 - 10.3 mg/dL   Total Protein 8.8 (H) 6.5 - 8.1 g/dL   Albumin 5.0 3.5 - 5.0 g/dL   AST 25 15 - 41 U/L   ALT 22 14 - 54 U/L   Alkaline Phosphatase 63 38 - 126 U/L   Total Bilirubin 0.9 0.3 - 1.2 mg/dL   GFR calc non Af Amer >60 >60 mL/min   GFR calc Af Amer >60 >60 mL/min    Comment: (NOTE) The eGFR has been calculated using the CKD EPI equation. This calculation has not been validated in all clinical situations. eGFR's persistently <60 mL/min signify possible Chronic Kidney Disease.    Anion gap 13 5 - 15  Ethanol (ETOH)     Status: None   Collection Time: 06/16/15  8:09 PM  Result Value Ref Range   Alcohol, Ethyl (B) <5 <5 mg/dL    Comment:        LOWEST DETECTABLE LIMIT FOR SERUM ALCOHOL IS 5 mg/dL FOR MEDICAL PURPOSES ONLY   Salicylate level     Status: None   Collection Time: 06/16/15  8:09 PM  Result Value Ref Range   Salicylate Lvl <5.8 2.8 - 30.0 mg/dL  Acetaminophen level     Status: Abnormal   Collection Time: 06/16/15  8:09 PM  Result Value Ref Range   Acetaminophen (Tylenol), Serum <10 (L) 10 - 30 ug/mL    Comment:        THERAPEUTIC CONCENTRATIONS  VARY SIGNIFICANTLY. A RANGE OF 10-30 ug/mL MAY BE AN EFFECTIVE CONCENTRATION FOR MANY PATIENTS. HOWEVER, SOME ARE BEST TREATED AT CONCENTRATIONS OUTSIDE THIS RANGE. ACETAMINOPHEN CONCENTRATIONS >150 ug/mL AT 4 HOURS AFTER INGESTION AND >50 ug/mL AT 12 HOURS AFTER INGESTION ARE OFTEN ASSOCIATED WITH TOXIC REACTIONS.   CBC     Status: Abnormal   Collection Time: 06/16/15  8:09 PM  Result Value Ref Range   WBC 9.1 4.0 - 10.5 K/uL   RBC 5.24 (H) 3.87 - 5.11 MIL/uL   Hemoglobin 15.4 (  H) 12.0 - 15.0 g/dL   HCT 47.1 (H) 36.0 - 46.0 %   MCV 89.9 78.0 - 100.0 fL   MCH 29.4 26.0 - 34.0 pg   MCHC 32.7 30.0 - 36.0 g/dL   RDW 12.9 11.5 - 15.5 %   Platelets 396 150 - 400 K/uL  TSH     Status: None   Collection Time: 06/16/15 10:16 PM  Result Value Ref Range   TSH 1.514 0.350 - 4.500 uIU/mL  Urine rapid drug screen (hosp performed) (Not at [facility])     Status: None   Collection Time: 06/17/15  6:58 AM  Result Value Ref Range   Opiates NONE DETECTED NONE DETECTED   Cocaine NONE DETECTED NONE DETECTED   Benzodiazepines NONE DETECTED NONE DETECTED   Amphetamines NONE DETECTED NONE DETECTED   Tetrahydrocannabinol NONE DETECTED NONE DETECTED   Barbiturates NONE DETECTED NONE DETECTED    Comment:        DRUG SCREEN FOR MEDICAL PURPOSES ONLY.  IF CONFIRMATION IS NEEDED FOR ANY PURPOSE, NOTIFY LAB WITHIN 5 DAYS.        LOWEST DETECTABLE LIMITS FOR URINE DRUG SCREEN Drug Class       Cutoff (ng/mL) Amphetamine      1000 Barbiturate      200 Benzodiazepine   161 Tricyclics       096 Opiates          300 Cocaine          300 THC              50   Urinalysis, Routine w reflex microscopic (not at [facility])     Status: Abnormal   Collection Time: 06/17/15  6:58 AM  Result Value Ref Range   Color, Urine YELLOW YELLOW   APPearance CLOUDY (A) CLEAR   Specific Gravity, Urine 1.018 1.005 - 1.030   pH 5.5 5.0 - 8.0   Glucose, UA NEGATIVE NEGATIVE mg/dL   Hgb urine dipstick NEGATIVE NEGATIVE    Bilirubin Urine SMALL (A) NEGATIVE   Ketones, ur NEGATIVE NEGATIVE mg/dL   Protein, ur NEGATIVE NEGATIVE mg/dL   Nitrite NEGATIVE NEGATIVE   Leukocytes, UA NEGATIVE NEGATIVE    Comment: MICROSCOPIC NOT DONE ON URINES WITH NEGATIVE PROTEIN, BLOOD, LEUKOCYTES, NITRITE, OR GLUCOSE <1000 mg/dL.    Current Facility-Administered Medications  Medication Dose Route Frequency Provider Last Rate Last Dose  . acetaminophen (TYLENOL) tablet 650 mg  650 mg Oral Q4H PRN Forde Dandy, MD      . amLODipine (NORVASC) tablet 5 mg  5 mg Oral Daily Forde Dandy, MD   5 mg at 06/17/15 1006  . ARIPiprazole (ABILIFY) tablet 15 mg  15 mg Oral Daily Forde Dandy, MD   15 mg at 06/16/15 2301  . benztropine (COGENTIN) tablet 1 mg  1 mg Oral BID Forde Dandy, MD   1 mg at 06/16/15 2257  . lisinopril (PRINIVIL,ZESTRIL) tablet 20 mg  20 mg Oral Daily Forde Dandy, MD   20 mg at 06/17/15 0454   And  . hydrochlorothiazide (MICROZIDE) capsule 12.5 mg  12.5 mg Oral Daily Forde Dandy, MD   12.5 mg at 06/17/15 0650  . LORazepam (ATIVAN) tablet 1 mg  1 mg Oral Q8H PRN Forde Dandy, MD      . ondansetron [facility]) tablet 4 mg  4 mg Oral Q8H PRN Forde Dandy, MD      . potassium chloride SA (K-DUR,KLOR-CON) CR tablet 20 mEq  20 mEq Oral Daily Forde Dandy, MD   20 mEq at 06/17/15 1006   Current Outpatient Prescriptions  Medication Sig Dispense Refill  . amLODipine (NORVASC) 5 MG tablet Take 1 tablet (5 mg total) by mouth daily. 90 tablet 0  . benztropine (COGENTIN) 1 MG tablet Take 1 tablet (1 mg total) by mouth 2 (two) times daily. 30 tablet 0  . lisinopril-hydrochlorothiazide (ZESTORETIC) 20-12.5 MG tablet Take 1 tablet by mouth daily. 90 tablet 0  . potassium chloride SA (K-DUR,KLOR-CON) 20 MEQ tablet Take 1 tablet (20 mEq total) by mouth daily. 90 tablet 0  . ARIPiprazole (ABILIFY) 15 MG tablet Take 1 tablet (15 mg total) by mouth daily. (Patient not taking: Reported on 06/16/2015) 30 tablet 1  . fluconazole (DIFLUCAN) 150  MG tablet 1 tablet po now, may repeat in 1 wk (Patient not taking: Reported on 06/16/2015) 2 tablet 0  . metroNIDAZOLE (FLAGYL) 500 MG tablet Take 1 tablet (500 mg total) by mouth 2 (two) times daily. 4 tablets once, don't consume alcohol while taking the medication (Patient not taking: Reported on 06/16/2015) 14 tablet 0    Musculoskeletal: Strength & Muscle Tone: within normal limits Gait & Station: normal Patient leans: N/A  Psychiatric Specialty Exam: Review of Systems  Unable to perform ROS: mental acuity    Blood pressure 172/103, pulse 86, temperature 97.9 F (36.6 C), temperature source Oral, resp. rate 16, SpO2 100 %.There is no weight on file to calculate BMI.  General Appearance: Casual and Disheveled  Eye Contact::  Good  Speech:  Slow and minimal speech  Volume:  Normal  Mood:  Anxious and Depressed  Affect:  Blunt and Restricted  Thought Process:  Linear  Orientation:  Full (Time, Place, and Person)  Thought Content:  WDL  Suicidal Thoughts:  No  Homicidal Thoughts:  No  Memory:  Immediate;   Poor Recent;   Poor Remote;   Poor  Judgement:  Poor  Insight:  Shallow  Psychomotor Activity:  Psychomotor Retardation  Concentration:  Poor  Recall:  Poor  Fund of Knowledge:Poor  Language: Poor  Akathisia:  No  Handed:  Right  AIMS (if indicated):     Assets:  Desire for Improvement  ADL's:  Impaired  Cognition: Impaired,  Moderate  Sleep:      Treatment Plan Summary: Daily contact with patient to assess and evaluate symptoms and progress in treatment and Medication management  Disposition: Accepted for admission and we will be seeking placement at any facility with available bed.  We have resumed all of her home  Medications.  For her elevated BP, Clonidine 0.1 mg  Po x1 has been prescribed.  Delfin Gant, NP   PMHNP-BC 06/17/2015 2:06 PM Patient seen face-to-face for psychiatric evaluation, chart reviewed and case discussed with the physician extender and  developed treatment plan. Reviewed the information documented and agree with the treatment plan. Corena Pilgrim, MD

## 2015-06-18 MED ORDER — LORAZEPAM 1 MG PO TABS
1.0000 mg | ORAL_TABLET | Freq: Once | ORAL | Status: DC
  Filled 2015-06-18: qty 1

## 2015-06-18 MED ORDER — SODIUM CHLORIDE 0.9 % IV BOLUS (SEPSIS)
1000.0000 mL | Freq: Once | INTRAVENOUS | Status: AC
  Administered 2015-06-18: 1000 mL via INTRAVENOUS

## 2015-06-18 NOTE — ED Provider Notes (Signed)
Patient was awaiting bed placement and was about to be transported when nursing staff noted tachycardia. She was reportedly in the 120s. She was brought back to the ED for IV fluids as they reported that she had not been eating. By the time I got an EKG, her heart rate was normal in the 80s, no ischemic changes. She received a liter of IV fluids and on reexamination she continued her main with heart rate in the 80s, comfortable on exam. No new complaints. Patient transferred back to TCU to await psych placement.  Laurence Spatesachel Morgan Kawena Lyday, MD 06/18/15 [phone]

## 2015-06-18 NOTE — ED Notes (Addendum)
Dr A is aware that the pt is not eating/drinking or taking medications

## 2015-06-18 NOTE — ED Notes (Signed)
Pt ate several bites of potato and meat w/ sister assist, and also took her potassium , Pt darank aprox 1/2 of lemonade.

## 2015-06-18 NOTE — ED Notes (Signed)
Pt's husband into see, [facility] given

## 2015-06-18 NOTE — ED Notes (Signed)
Pt's sister updated w/ contact information for [facility]

## 2015-06-18 NOTE — ED Notes (Signed)
Pt's husband into see 

## 2015-06-18 NOTE — ED Notes (Addendum)
Sheriff contacted for transport, message left for husband to call concerning transfer and contact information.

## 2015-06-18 NOTE — ED Notes (Signed)
Pt agreed to take her BP medication w/ assistance from husband with several sips of apple juice.

## 2015-06-18 NOTE — ED Notes (Signed)
Attempted to call report.  Unit will not accept report until transport is here.  Verified that pt has been accepted for admission.

## 2015-06-18 NOTE — ED Notes (Signed)
Dr A and Josephine NP into see 

## 2015-06-18 NOTE — ED Notes (Signed)
Josephine NP into see 

## 2015-06-18 NOTE — ED Notes (Signed)
[id number] Expected date:  Expected time:  Means of arrival:  Comments: Hold bed, rm 10, [place]

## 2015-06-18 NOTE — ED Notes (Signed)
Message left for pt's sister to call

## 2015-06-18 NOTE — BH Assessment (Signed)
[facility] Assessment Progress Note  Per Thedore MinsMojeed Akintayo, MD, this pt requires psychiatric hospitalization at this time.  She presents under IVC initiated by Sharyne PeachWhitney Bakken[facility].  At 13:26 Thayer OhmChris calls [facility].  Pt has been accepted to their facility by Dr. Mitzi DavenportShelby to Rm 150-2 on the [facility].  Please call report to [phone].  Dahlia ByesJosephine Onuoha, NP, concurs with this decision.  Pt's nurse, Wille CelesteJanie, has been notified and agrees to call report.  [facility].  Doylene Canninghomas Devin Ganaway, MA Triage Specialist [phone]

## 2015-06-18 NOTE — ED Notes (Addendum)
Sheriff is here to transport 

## 2015-06-18 NOTE — ED Notes (Addendum)
Pt's daughter into see.  Pt declines to drink for her daughter

## 2015-06-18 NOTE — ED Notes (Signed)
Bed: [id number] Expected date:  Expected time:  Means of arrival:  Comments: [facility]

## 2015-06-18 NOTE — ED Notes (Signed)
Report called to RN Richard, pt transferred to rm 10 accompanied by husband.

## 2015-06-18 NOTE — ED Notes (Addendum)
Contact information for [facility] given daughter Amy Casey(Amy Casey) with verbal permission from pt.

## 2015-06-18 NOTE — ED Notes (Signed)
Dr Clarene DukeLittle updated w/ VS

## 2015-06-18 NOTE — ED Notes (Signed)
Sister reports that the pt was on ablilfy for awhile but stopped because it made her sluggish.  She also reports that she thinks she was given Haldol shots before while in the hospital, but has never been on injections when out of the hosiptal.

## 2015-06-18 NOTE — ED Notes (Signed)
Pt's sister in w/ pt

## 2015-06-18 NOTE — ED Notes (Signed)
Patient received and was selectively mute. Patient does not answer assessment questions. Patient calm and sitting in the edge of the bed. Patient gave no verbal answers to assessment questions. But nodded head when asked about contracting for safety. Patient compliant with staff directives but is slow to respond. Will continue Q 15 min checks for safety.

## 2015-06-18 NOTE — ED Notes (Signed)
Pt declined PO fluids, and would not eat breakfast and declined to take medications

## 2015-06-18 NOTE — ED Notes (Addendum)
Up to the bathroom.  Pt hesitant to go into the bathroom, appears to be talking to unknown person.

## 2015-06-19 MED ORDER — PROPRANOLOL HCL 10 MG PO TABS
10.0000 mg | ORAL_TABLET | Freq: Two times a day (BID) | ORAL | Status: DC
  Filled 2015-06-19 (×2): qty 1

## 2015-06-19 NOTE — BH Assessment (Signed)
Patient was reassessed by TTS.   Patient was sitting in her room on the bed. Patient states "I'm fine" when asked how she feels today. Patient states that she has been eating and she could not sleep last night. Patients nurse states that she has not taking her medications as prescribed. Patient denies SI/HI and AVH and reports "no, not no more, I don't have that now." Patient denies questions or concerns at this time.    Informed psychiatric team and patient continues to meet inpatient criteria. [facility].   Davina PokeJoVea Frances Joynt, LCSW Therapeutic Triage Specialist [facility] 06/19/2015 11:17 AM]

## 2015-06-19 NOTE — ED Notes (Signed)
Patient is very withdrawn with minimal verbal interaction. Patient denies SI/HI and A/V hallucinations, however does present as paranoid and may not trust staff enough to let us know. Patient is redirectable, very soft spoken, isolative, and refuses all medications. [facility], [place] called to [place], Charity fundraiserN. Sheriff Paschal stated he was sending an Technical sales engineerofficer approximately an hour ago.  Jayanna Kroeger, Wyman SongsterAngela Marie, RN

## 2015-06-24 DIAGNOSIS — I10 Essential (primary) hypertension: Secondary | ICD-10-CM | POA: Diagnosis not present

## 2015-06-24 DIAGNOSIS — F332 Major depressive disorder, recurrent severe without psychotic features: Secondary | ICD-10-CM | POA: Diagnosis not present

## 2015-06-24 DIAGNOSIS — F209 Schizophrenia, unspecified: Secondary | ICD-10-CM | POA: Diagnosis not present

## 2015-06-24 DIAGNOSIS — R4182 Altered mental status, unspecified: Secondary | ICD-10-CM | POA: Diagnosis not present

## 2015-06-24 DIAGNOSIS — N3000 Acute cystitis without hematuria: Secondary | ICD-10-CM | POA: Diagnosis not present

## 2015-06-24 DIAGNOSIS — R42 Dizziness and giddiness: Secondary | ICD-10-CM | POA: Diagnosis not present

## 2015-06-24 DIAGNOSIS — R410 Disorientation, unspecified: Secondary | ICD-10-CM | POA: Diagnosis not present

## 2015-06-24 DIAGNOSIS — R0902 Hypoxemia: Secondary | ICD-10-CM | POA: Diagnosis not present

## 2015-06-24 DIAGNOSIS — R630 Anorexia: Secondary | ICD-10-CM | POA: Diagnosis not present

## 2015-06-24 DIAGNOSIS — Z8249 Family history of ischemic heart disease and other diseases of the circulatory system: Secondary | ICD-10-CM | POA: Diagnosis not present

## 2015-06-24 DIAGNOSIS — F200 Paranoid schizophrenia: Secondary | ICD-10-CM | POA: Diagnosis not present

## 2015-06-24 DIAGNOSIS — F322 Major depressive disorder, single episode, severe without psychotic features: Secondary | ICD-10-CM | POA: Diagnosis not present

## 2015-06-24 DIAGNOSIS — E876 Hypokalemia: Secondary | ICD-10-CM | POA: Diagnosis not present

## 2015-06-24 DIAGNOSIS — R7989 Other specified abnormal findings of blood chemistry: Secondary | ICD-10-CM | POA: Diagnosis not present

## 2015-06-24 DIAGNOSIS — R627 Adult failure to thrive: Secondary | ICD-10-CM | POA: Diagnosis not present

## 2015-06-24 DIAGNOSIS — R9401 Abnormal electroencephalogram [EEG]: Secondary | ICD-10-CM | POA: Diagnosis not present

## 2015-06-24 DIAGNOSIS — Z833 Family history of diabetes mellitus: Secondary | ICD-10-CM | POA: Diagnosis not present

## 2015-06-24 DIAGNOSIS — Z79899 Other long term (current) drug therapy: Secondary | ICD-10-CM | POA: Diagnosis not present

## 2015-06-24 DIAGNOSIS — T424X5A Adverse effect of benzodiazepines, initial encounter: Secondary | ICD-10-CM | POA: Diagnosis not present

## 2015-06-24 DIAGNOSIS — R55 Syncope and collapse: Secondary | ICD-10-CM | POA: Diagnosis not present

## 2015-06-24 DIAGNOSIS — N179 Acute kidney failure, unspecified: Secondary | ICD-10-CM | POA: Diagnosis not present

## 2015-06-24 DIAGNOSIS — R531 Weakness: Secondary | ICD-10-CM | POA: Diagnosis not present

## 2015-06-27 DIAGNOSIS — F200 Paranoid schizophrenia: Secondary | ICD-10-CM | POA: Diagnosis not present

## 2015-06-27 DIAGNOSIS — F332 Major depressive disorder, recurrent severe without psychotic features: Secondary | ICD-10-CM | POA: Diagnosis not present

## 2015-06-28 DIAGNOSIS — F200 Paranoid schizophrenia: Secondary | ICD-10-CM | POA: Diagnosis not present

## 2015-06-29 DIAGNOSIS — F209 Schizophrenia, unspecified: Secondary | ICD-10-CM | POA: Diagnosis not present

## 2015-06-29 DIAGNOSIS — R630 Anorexia: Secondary | ICD-10-CM | POA: Diagnosis not present

## 2015-06-29 DIAGNOSIS — I10 Essential (primary) hypertension: Secondary | ICD-10-CM | POA: Diagnosis not present

## 2015-06-29 DIAGNOSIS — R42 Dizziness and giddiness: Secondary | ICD-10-CM | POA: Diagnosis not present

## 2015-06-30 DIAGNOSIS — R630 Anorexia: Secondary | ICD-10-CM | POA: Diagnosis not present

## 2015-06-30 DIAGNOSIS — F209 Schizophrenia, unspecified: Secondary | ICD-10-CM | POA: Diagnosis not present

## 2015-06-30 DIAGNOSIS — I10 Essential (primary) hypertension: Secondary | ICD-10-CM | POA: Diagnosis not present

## 2015-06-30 DIAGNOSIS — R0902 Hypoxemia: Secondary | ICD-10-CM | POA: Diagnosis not present

## 2015-07-01 DIAGNOSIS — F209 Schizophrenia, unspecified: Secondary | ICD-10-CM | POA: Diagnosis not present

## 2015-07-01 DIAGNOSIS — I10 Essential (primary) hypertension: Secondary | ICD-10-CM | POA: Diagnosis not present

## 2015-07-01 DIAGNOSIS — R630 Anorexia: Secondary | ICD-10-CM | POA: Diagnosis not present

## 2015-07-02 ENCOUNTER — Telehealth: Payer: Self-pay | Admitting: Medical

## 2015-07-02 DIAGNOSIS — R630 Anorexia: Secondary | ICD-10-CM | POA: Diagnosis not present

## 2015-07-02 DIAGNOSIS — R55 Syncope and collapse: Secondary | ICD-10-CM | POA: Diagnosis not present

## 2015-07-02 DIAGNOSIS — Z833 Family history of diabetes mellitus: Secondary | ICD-10-CM | POA: Diagnosis not present

## 2015-07-02 DIAGNOSIS — R531 Weakness: Secondary | ICD-10-CM | POA: Diagnosis not present

## 2015-07-02 DIAGNOSIS — F200 Paranoid schizophrenia: Secondary | ICD-10-CM | POA: Diagnosis not present

## 2015-07-02 DIAGNOSIS — N179 Acute kidney failure, unspecified: Secondary | ICD-10-CM | POA: Diagnosis not present

## 2015-07-02 DIAGNOSIS — N3000 Acute cystitis without hematuria: Secondary | ICD-10-CM | POA: Diagnosis not present

## 2015-07-02 DIAGNOSIS — R7989 Other specified abnormal findings of blood chemistry: Secondary | ICD-10-CM | POA: Diagnosis not present

## 2015-07-02 DIAGNOSIS — R4182 Altered mental status, unspecified: Secondary | ICD-10-CM | POA: Diagnosis not present

## 2015-07-02 DIAGNOSIS — F322 Major depressive disorder, single episode, severe without psychotic features: Secondary | ICD-10-CM | POA: Diagnosis not present

## 2015-07-02 DIAGNOSIS — Z79899 Other long term (current) drug therapy: Secondary | ICD-10-CM | POA: Diagnosis not present

## 2015-07-02 DIAGNOSIS — R42 Dizziness and giddiness: Secondary | ICD-10-CM | POA: Diagnosis not present

## 2015-07-02 DIAGNOSIS — Z8249 Family history of ischemic heart disease and other diseases of the circulatory system: Secondary | ICD-10-CM | POA: Diagnosis not present

## 2015-07-02 DIAGNOSIS — I10 Essential (primary) hypertension: Secondary | ICD-10-CM | POA: Diagnosis not present

## 2015-07-02 DIAGNOSIS — R410 Disorientation, unspecified: Secondary | ICD-10-CM | POA: Diagnosis not present

## 2015-07-02 DIAGNOSIS — R9401 Abnormal electroencephalogram [EEG]: Secondary | ICD-10-CM | POA: Diagnosis not present

## 2015-07-02 NOTE — Telephone Encounter (Signed)
Pt's husband, Marilu FavreClarence, called wanting to know if Vincenza HewsShane can help him get pt moved to a hospital here in town to be closer to him and family. [place] [facility] and husband states that she is not doing well, not speaking at times, not eating well at times, and he get calls from hospital stating that she is not responding and have to drive to [place] get to her if he is not already there.

## 2015-07-02 NOTE — Telephone Encounter (Signed)
I would recommend he call [facility] to inquire if they would be willing to help him in this matter.  This would be the first step.  This is tough.  I'm not sure I would have any ability to make this happen.  He could also call [facility] or [facility] to see if they could assist in this.  I'll check into something this morning as well.

## 2015-07-03 DIAGNOSIS — N179 Acute kidney failure, unspecified: Secondary | ICD-10-CM | POA: Diagnosis not present

## 2015-07-03 DIAGNOSIS — I519 Heart disease, unspecified: Secondary | ICD-10-CM | POA: Diagnosis not present

## 2015-07-03 DIAGNOSIS — Z833 Family history of diabetes mellitus: Secondary | ICD-10-CM | POA: Diagnosis not present

## 2015-07-03 DIAGNOSIS — Z79899 Other long term (current) drug therapy: Secondary | ICD-10-CM | POA: Diagnosis not present

## 2015-07-03 DIAGNOSIS — R9401 Abnormal electroencephalogram [EEG]: Secondary | ICD-10-CM | POA: Diagnosis not present

## 2015-07-03 DIAGNOSIS — R42 Dizziness and giddiness: Secondary | ICD-10-CM | POA: Diagnosis not present

## 2015-07-03 DIAGNOSIS — I10 Essential (primary) hypertension: Secondary | ICD-10-CM | POA: Diagnosis not present

## 2015-07-03 DIAGNOSIS — R410 Disorientation, unspecified: Secondary | ICD-10-CM | POA: Diagnosis not present

## 2015-07-03 DIAGNOSIS — F200 Paranoid schizophrenia: Secondary | ICD-10-CM | POA: Diagnosis not present

## 2015-07-03 DIAGNOSIS — R9082 White matter disease, unspecified: Secondary | ICD-10-CM | POA: Diagnosis not present

## 2015-07-03 DIAGNOSIS — Z8249 Family history of ischemic heart disease and other diseases of the circulatory system: Secondary | ICD-10-CM | POA: Diagnosis not present

## 2015-07-03 DIAGNOSIS — R7989 Other specified abnormal findings of blood chemistry: Secondary | ICD-10-CM | POA: Diagnosis not present

## 2015-07-03 DIAGNOSIS — N3000 Acute cystitis without hematuria: Secondary | ICD-10-CM | POA: Diagnosis not present

## 2015-07-03 DIAGNOSIS — R55 Syncope and collapse: Secondary | ICD-10-CM | POA: Diagnosis not present

## 2015-07-03 DIAGNOSIS — R630 Anorexia: Secondary | ICD-10-CM | POA: Diagnosis not present

## 2015-07-03 DIAGNOSIS — R531 Weakness: Secondary | ICD-10-CM | POA: Diagnosis not present

## 2015-07-03 DIAGNOSIS — F322 Major depressive disorder, single episode, severe without psychotic features: Secondary | ICD-10-CM | POA: Diagnosis not present

## 2015-07-03 DIAGNOSIS — I517 Cardiomegaly: Secondary | ICD-10-CM | POA: Diagnosis not present

## 2015-07-03 DIAGNOSIS — R4182 Altered mental status, unspecified: Secondary | ICD-10-CM | POA: Diagnosis not present

## 2015-07-03 NOTE — Telephone Encounter (Signed)
Let him know I wish I had more pull in this.     Courtney - touch base with him Monday.   Other than getting her out of [place] and taking her back to [facility], he will have to work with the floor supervisor or psychiatrist there to improve on whatever issues they are having.

## 2015-07-03 NOTE — Telephone Encounter (Signed)
Let him know her response, and see if he has discussed with [facility] this morning?

## 2015-07-03 NOTE — Telephone Encounter (Signed)
Spoke with a woman in [facility] admissions she stated that we can not initate and transfer the husband has to talk to the admissions / transfers and tell them that he is unhappy and wants her moved. Then that department has to initate the move and if Behavioral Health has beds they will take her. Pts husband is aware of this

## 2015-07-03 NOTE — Telephone Encounter (Signed)
He was at work stated he would talk to them as soon as he could

## 2015-07-03 NOTE — Telephone Encounter (Signed)
pls call [facility] to see if you can speak to whoever deal with admissions/transfers.    Let them know one of our patients was transferred [facility] few weeks ago for Schizophrenia to [place] due to lack of beds/rooms.   husband very worried she is not being taken care of and left for prolonged periods, would [place].   Who can help us with this?

## 2015-07-04 DIAGNOSIS — R531 Weakness: Secondary | ICD-10-CM | POA: Diagnosis not present

## 2015-07-04 DIAGNOSIS — Z79899 Other long term (current) drug therapy: Secondary | ICD-10-CM | POA: Diagnosis not present

## 2015-07-04 DIAGNOSIS — F200 Paranoid schizophrenia: Secondary | ICD-10-CM | POA: Diagnosis not present

## 2015-07-04 DIAGNOSIS — Z8249 Family history of ischemic heart disease and other diseases of the circulatory system: Secondary | ICD-10-CM | POA: Diagnosis not present

## 2015-07-04 DIAGNOSIS — R4182 Altered mental status, unspecified: Secondary | ICD-10-CM | POA: Diagnosis not present

## 2015-07-04 DIAGNOSIS — I10 Essential (primary) hypertension: Secondary | ICD-10-CM | POA: Diagnosis not present

## 2015-07-04 DIAGNOSIS — R42 Dizziness and giddiness: Secondary | ICD-10-CM | POA: Diagnosis not present

## 2015-07-04 DIAGNOSIS — N179 Acute kidney failure, unspecified: Secondary | ICD-10-CM | POA: Diagnosis not present

## 2015-07-04 DIAGNOSIS — Z833 Family history of diabetes mellitus: Secondary | ICD-10-CM | POA: Diagnosis not present

## 2015-07-04 DIAGNOSIS — F322 Major depressive disorder, single episode, severe without psychotic features: Secondary | ICD-10-CM | POA: Diagnosis not present

## 2015-07-04 DIAGNOSIS — R7989 Other specified abnormal findings of blood chemistry: Secondary | ICD-10-CM | POA: Diagnosis not present

## 2015-07-04 DIAGNOSIS — R55 Syncope and collapse: Secondary | ICD-10-CM | POA: Diagnosis not present

## 2015-07-04 DIAGNOSIS — N3000 Acute cystitis without hematuria: Secondary | ICD-10-CM | POA: Diagnosis not present

## 2015-07-04 DIAGNOSIS — R630 Anorexia: Secondary | ICD-10-CM | POA: Diagnosis not present

## 2015-07-04 DIAGNOSIS — R410 Disorientation, unspecified: Secondary | ICD-10-CM | POA: Diagnosis not present

## 2015-07-04 DIAGNOSIS — R9401 Abnormal electroencephalogram [EEG]: Secondary | ICD-10-CM | POA: Diagnosis not present

## 2015-07-05 DIAGNOSIS — R9401 Abnormal electroencephalogram [EEG]: Secondary | ICD-10-CM | POA: Diagnosis not present

## 2015-07-05 DIAGNOSIS — N3000 Acute cystitis without hematuria: Secondary | ICD-10-CM | POA: Diagnosis not present

## 2015-07-05 DIAGNOSIS — F332 Major depressive disorder, recurrent severe without psychotic features: Secondary | ICD-10-CM | POA: Diagnosis not present

## 2015-07-05 DIAGNOSIS — R42 Dizziness and giddiness: Secondary | ICD-10-CM | POA: Diagnosis not present

## 2015-07-05 DIAGNOSIS — R410 Disorientation, unspecified: Secondary | ICD-10-CM | POA: Diagnosis not present

## 2015-07-05 DIAGNOSIS — F322 Major depressive disorder, single episode, severe without psychotic features: Secondary | ICD-10-CM | POA: Diagnosis not present

## 2015-07-05 DIAGNOSIS — N179 Acute kidney failure, unspecified: Secondary | ICD-10-CM | POA: Diagnosis not present

## 2015-07-05 DIAGNOSIS — R7989 Other specified abnormal findings of blood chemistry: Secondary | ICD-10-CM | POA: Diagnosis not present

## 2015-07-05 DIAGNOSIS — I10 Essential (primary) hypertension: Secondary | ICD-10-CM | POA: Diagnosis not present

## 2015-07-05 DIAGNOSIS — R4182 Altered mental status, unspecified: Secondary | ICD-10-CM | POA: Diagnosis not present

## 2015-07-05 DIAGNOSIS — Z8249 Family history of ischemic heart disease and other diseases of the circulatory system: Secondary | ICD-10-CM | POA: Diagnosis not present

## 2015-07-05 DIAGNOSIS — R55 Syncope and collapse: Secondary | ICD-10-CM | POA: Diagnosis not present

## 2015-07-05 DIAGNOSIS — F200 Paranoid schizophrenia: Secondary | ICD-10-CM | POA: Diagnosis not present

## 2015-07-05 DIAGNOSIS — Z833 Family history of diabetes mellitus: Secondary | ICD-10-CM | POA: Diagnosis not present

## 2015-07-05 DIAGNOSIS — R630 Anorexia: Secondary | ICD-10-CM | POA: Diagnosis not present

## 2015-07-05 DIAGNOSIS — R531 Weakness: Secondary | ICD-10-CM | POA: Diagnosis not present

## 2015-07-05 DIAGNOSIS — Z79899 Other long term (current) drug therapy: Secondary | ICD-10-CM | POA: Diagnosis not present

## 2015-07-06 DIAGNOSIS — Z8249 Family history of ischemic heart disease and other diseases of the circulatory system: Secondary | ICD-10-CM | POA: Diagnosis not present

## 2015-07-06 DIAGNOSIS — Z833 Family history of diabetes mellitus: Secondary | ICD-10-CM | POA: Diagnosis not present

## 2015-07-06 DIAGNOSIS — R42 Dizziness and giddiness: Secondary | ICD-10-CM | POA: Diagnosis not present

## 2015-07-06 DIAGNOSIS — R7989 Other specified abnormal findings of blood chemistry: Secondary | ICD-10-CM | POA: Diagnosis not present

## 2015-07-06 DIAGNOSIS — R630 Anorexia: Secondary | ICD-10-CM | POA: Diagnosis not present

## 2015-07-06 DIAGNOSIS — Z79899 Other long term (current) drug therapy: Secondary | ICD-10-CM | POA: Diagnosis not present

## 2015-07-06 DIAGNOSIS — F322 Major depressive disorder, single episode, severe without psychotic features: Secondary | ICD-10-CM | POA: Diagnosis not present

## 2015-07-06 DIAGNOSIS — R4182 Altered mental status, unspecified: Secondary | ICD-10-CM | POA: Diagnosis not present

## 2015-07-06 DIAGNOSIS — N179 Acute kidney failure, unspecified: Secondary | ICD-10-CM | POA: Diagnosis not present

## 2015-07-06 DIAGNOSIS — N3000 Acute cystitis without hematuria: Secondary | ICD-10-CM | POA: Diagnosis not present

## 2015-07-06 DIAGNOSIS — R55 Syncope and collapse: Secondary | ICD-10-CM | POA: Diagnosis not present

## 2015-07-06 DIAGNOSIS — R531 Weakness: Secondary | ICD-10-CM | POA: Diagnosis not present

## 2015-07-06 DIAGNOSIS — R9401 Abnormal electroencephalogram [EEG]: Secondary | ICD-10-CM | POA: Diagnosis not present

## 2015-07-06 DIAGNOSIS — R4020 Unspecified coma: Secondary | ICD-10-CM | POA: Diagnosis not present

## 2015-07-06 DIAGNOSIS — R410 Disorientation, unspecified: Secondary | ICD-10-CM | POA: Diagnosis not present

## 2015-07-06 DIAGNOSIS — I10 Essential (primary) hypertension: Secondary | ICD-10-CM | POA: Diagnosis not present

## 2015-07-06 DIAGNOSIS — F200 Paranoid schizophrenia: Secondary | ICD-10-CM | POA: Diagnosis not present

## 2015-07-06 NOTE — Telephone Encounter (Signed)
LMTCB

## 2015-07-06 NOTE — Telephone Encounter (Signed)
FYI: Spoke with husband and he said that it has been crazy trying to talk to someone ended up leaving a message for someone to call him back after being transferred 5 times. Pt has been moved to suicide watch, so pts husband has not been able to call her. Only can talk to her when he goes down there. Pts husband is very angry I calmed him down best I could and let him know I understand his anger but try to control his tone and watch his word choices and that could help him get further with them then just yelling at them. He stated he understands and was trying to stay calm but he is just upset about his wife being put on suicide watch and not being able to talk to him at all.

## 2015-07-07 DIAGNOSIS — R410 Disorientation, unspecified: Secondary | ICD-10-CM | POA: Diagnosis not present

## 2015-07-07 DIAGNOSIS — F332 Major depressive disorder, recurrent severe without psychotic features: Secondary | ICD-10-CM | POA: Diagnosis not present

## 2015-07-07 DIAGNOSIS — N3000 Acute cystitis without hematuria: Secondary | ICD-10-CM | POA: Diagnosis not present

## 2015-07-07 DIAGNOSIS — R9401 Abnormal electroencephalogram [EEG]: Secondary | ICD-10-CM | POA: Diagnosis not present

## 2015-07-07 DIAGNOSIS — R4182 Altered mental status, unspecified: Secondary | ICD-10-CM | POA: Diagnosis not present

## 2015-07-07 DIAGNOSIS — R55 Syncope and collapse: Secondary | ICD-10-CM | POA: Diagnosis not present

## 2015-07-07 DIAGNOSIS — R7989 Other specified abnormal findings of blood chemistry: Secondary | ICD-10-CM | POA: Diagnosis not present

## 2015-07-07 DIAGNOSIS — N179 Acute kidney failure, unspecified: Secondary | ICD-10-CM | POA: Diagnosis not present

## 2015-07-07 DIAGNOSIS — F200 Paranoid schizophrenia: Secondary | ICD-10-CM | POA: Diagnosis not present

## 2015-07-07 DIAGNOSIS — Z8249 Family history of ischemic heart disease and other diseases of the circulatory system: Secondary | ICD-10-CM | POA: Diagnosis not present

## 2015-07-07 DIAGNOSIS — Z79899 Other long term (current) drug therapy: Secondary | ICD-10-CM | POA: Diagnosis not present

## 2015-07-07 DIAGNOSIS — R531 Weakness: Secondary | ICD-10-CM | POA: Diagnosis not present

## 2015-07-07 DIAGNOSIS — R630 Anorexia: Secondary | ICD-10-CM | POA: Diagnosis not present

## 2015-07-07 DIAGNOSIS — F322 Major depressive disorder, single episode, severe without psychotic features: Secondary | ICD-10-CM | POA: Diagnosis not present

## 2015-07-07 DIAGNOSIS — Z833 Family history of diabetes mellitus: Secondary | ICD-10-CM | POA: Diagnosis not present

## 2015-07-07 DIAGNOSIS — I10 Essential (primary) hypertension: Secondary | ICD-10-CM | POA: Diagnosis not present

## 2015-07-07 DIAGNOSIS — R42 Dizziness and giddiness: Secondary | ICD-10-CM | POA: Diagnosis not present

## 2015-07-08 DIAGNOSIS — F29 Unspecified psychosis not due to a substance or known physiological condition: Secondary | ICD-10-CM | POA: Diagnosis not present

## 2015-07-08 DIAGNOSIS — I10 Essential (primary) hypertension: Secondary | ICD-10-CM | POA: Diagnosis not present

## 2015-07-08 DIAGNOSIS — Z9114 Patient's other noncompliance with medication regimen: Secondary | ICD-10-CM | POA: Diagnosis not present

## 2015-07-08 DIAGNOSIS — R9401 Abnormal electroencephalogram [EEG]: Secondary | ICD-10-CM | POA: Diagnosis not present

## 2015-07-08 DIAGNOSIS — Z833 Family history of diabetes mellitus: Secondary | ICD-10-CM | POA: Diagnosis not present

## 2015-07-08 DIAGNOSIS — R55 Syncope and collapse: Secondary | ICD-10-CM | POA: Diagnosis not present

## 2015-07-08 DIAGNOSIS — R531 Weakness: Secondary | ICD-10-CM | POA: Diagnosis not present

## 2015-07-08 DIAGNOSIS — F209 Schizophrenia, unspecified: Secondary | ICD-10-CM | POA: Diagnosis not present

## 2015-07-08 DIAGNOSIS — R4182 Altered mental status, unspecified: Secondary | ICD-10-CM | POA: Diagnosis not present

## 2015-07-08 DIAGNOSIS — R630 Anorexia: Secondary | ICD-10-CM | POA: Diagnosis not present

## 2015-07-08 DIAGNOSIS — Z8249 Family history of ischemic heart disease and other diseases of the circulatory system: Secondary | ICD-10-CM | POA: Diagnosis not present

## 2015-07-08 DIAGNOSIS — F200 Paranoid schizophrenia: Secondary | ICD-10-CM | POA: Diagnosis not present

## 2015-07-08 DIAGNOSIS — N179 Acute kidney failure, unspecified: Secondary | ICD-10-CM | POA: Diagnosis not present

## 2015-07-08 DIAGNOSIS — R410 Disorientation, unspecified: Secondary | ICD-10-CM | POA: Diagnosis not present

## 2015-07-08 DIAGNOSIS — R42 Dizziness and giddiness: Secondary | ICD-10-CM | POA: Diagnosis not present

## 2015-07-08 DIAGNOSIS — N3000 Acute cystitis without hematuria: Secondary | ICD-10-CM | POA: Diagnosis not present

## 2015-07-08 DIAGNOSIS — F322 Major depressive disorder, single episode, severe without psychotic features: Secondary | ICD-10-CM | POA: Diagnosis not present

## 2015-07-08 DIAGNOSIS — Z79899 Other long term (current) drug therapy: Secondary | ICD-10-CM | POA: Diagnosis not present

## 2015-07-08 DIAGNOSIS — F332 Major depressive disorder, recurrent severe without psychotic features: Secondary | ICD-10-CM | POA: Diagnosis not present

## 2015-07-08 DIAGNOSIS — R7989 Other specified abnormal findings of blood chemistry: Secondary | ICD-10-CM | POA: Diagnosis not present

## 2015-07-08 NOTE — Telephone Encounter (Signed)
They are releasing her more then likely monday, stated they are starting her on a "shot" couldn't tell him what the shot was but she will have to get a booster [facility].

## 2015-07-08 NOTE — Telephone Encounter (Signed)
I would call and demand a meeting with the psychiatrist or supervisor of that floor and go down to meet with them.   I would also recommend he call and put pressure on [facility] since they also help facilitate the transfer.  I am out of town this week, but you can ask Dr. Lynelle DoctorKnapp or Dr. Susann GivensLalonde if there are any steps he or we could take

## 2015-07-11 DIAGNOSIS — F200 Paranoid schizophrenia: Secondary | ICD-10-CM | POA: Diagnosis not present

## 2015-07-14 DIAGNOSIS — F209 Schizophrenia, unspecified: Secondary | ICD-10-CM | POA: Diagnosis not present

## 2015-07-23 ENCOUNTER — Ambulatory Visit (INDEPENDENT_AMBULATORY_CARE_PROVIDER_SITE_OTHER): Payer: BLUE CROSS/BLUE SHIELD | Admitting: Family Medicine

## 2015-07-23 ENCOUNTER — Encounter: Payer: Self-pay | Admitting: Family Medicine

## 2015-07-23 VITALS — BP 100/70 | HR 88 | Resp 16

## 2015-07-23 DIAGNOSIS — I959 Hypotension, unspecified: Secondary | ICD-10-CM | POA: Diagnosis not present

## 2015-07-23 DIAGNOSIS — R42 Dizziness and giddiness: Secondary | ICD-10-CM | POA: Diagnosis not present

## 2015-07-23 DIAGNOSIS — R1310 Dysphagia, unspecified: Secondary | ICD-10-CM

## 2015-07-23 LAB — CBC WITH DIFFERENTIAL/PLATELET
Basophils Absolute: 0 cells/uL (ref 0–200)
Basophils Relative: 0 %
Eosinophils Absolute: 104 cells/uL (ref 15–500)
Eosinophils Relative: 2 %
HCT: 32 % — ABNORMAL LOW (ref 35.0–45.0)
Hemoglobin: 10.5 g/dL — ABNORMAL LOW (ref 11.7–15.5)
Lymphocytes Relative: 31 %
Lymphs Abs: 1612 cells/uL (ref 850–3900)
MCH: 28.2 pg (ref 27.0–33.0)
MCHC: 32.8 g/dL (ref 32.0–36.0)
MCV: 85.8 fL (ref 80.0–100.0)
MPV: 8.4 fL (ref 7.5–12.5)
Monocytes Absolute: 624 cells/uL (ref 200–950)
Monocytes Relative: 12 %
Neutro Abs: 2860 cells/uL (ref 1500–7800)
Neutrophils Relative %: 55 %
Platelets: 328 10*3/uL (ref 140–400)
RBC: 3.73 MIL/uL — ABNORMAL LOW (ref 3.80–5.10)
RDW: 13.6 % (ref 11.0–15.0)
WBC: 5.2 10*3/uL (ref 4.0–10.5)

## 2015-07-23 LAB — BASIC METABOLIC PANEL
BUN: 25 mg/dL (ref 7–25)
CO2: 24 mmol/L (ref 20–31)
Calcium: 9.1 mg/dL (ref 8.6–10.4)
Chloride: 105 mmol/L (ref 98–110)
Creat: 1.05 mg/dL (ref 0.50–1.05)
Glucose, Bld: 94 mg/dL (ref 65–99)
Potassium: 3.2 mmol/L — ABNORMAL LOW (ref 3.5–5.3)
Sodium: 140 mmol/L (ref 135–146)

## 2015-07-23 LAB — GLUCOSE, POCT (MANUAL RESULT ENTRY): POC Glucose: 182 mg/dl — AB (ref 70–99)

## 2015-07-23 NOTE — Patient Instructions (Addendum)
Do not take your blood pressure medication in the morning. Drink plenty of fluids today.  If you have any difficulty swallowing, difficulty breathing or any worrisome symptoms go to the emergency department or call 911.    Return to see Vincenza HewsShane, [place] at 1:20 tomorrow.

## 2015-07-23 NOTE — Progress Notes (Signed)
Subjective:    Patient ID: Amy Casey, female    DOB: 10-16-1960, 55 y.o.   MRN: [id number]  HPI Chief Complaint  Patient presents with  . Advice Only    slurred speec ans grogginess that began about 3 days ago-amlodipine was increased from  tol 10,g on 07/13/15 and was given paliperidone injection on 07/13/15.    She is here with complaints of slurred speech, fatigue, sleepiness, difficulty swallowing and bilateral hand tremor for past 3 days. She reports some intermittent dizziness that she describes as lightheadedness, worse when going from sitting to standing. She is dressed in her work uniform and states she worked all day today at [employer]. States she felt more tired than usual. She was discharged from [facility] inpatient psychiatric unit on 07/13/15. Prior to discharge her blood pressure medication amlodipine was increased from  to . She is also taking lisinopril 20 mg and HCTZ 12.5 daily. She does not check her blood pressure at home.  Her husband is with her today and states he has noticed that her speech is somewhat more slurred, she has been coughing when eating at times, and that she now has a tremor in her hands. He states otherwise she seems to be at baseline as far as mental status and appearance.   She was given an injection of a new pscyh medication, paliperidone, on Monday, the day before onset of symptoms. She was also prescribed oral paliperidone and prozac but has not started either one of these medications, she has the prescriptions with her today.   She denies fever, chills, body aches, double or blurred vision, neck stiffness or neck pain, headache, chest pain, palpitations, shortness of breath, abdominal pain, GI or GU symptoms.  She denies feeling like her lips are swollen or throat feeling tight.    Review of Systems Pertinent positives and negatives in the history of present illness.     Objective:   Physical Exam  Constitutional: She is oriented to  person, place, and time. She appears well-developed and well-nourished. No distress.  HENT:  Nose: Nose normal. Right sinus exhibits no maxillary sinus tenderness and no frontal sinus tenderness. Left sinus exhibits no maxillary sinus tenderness and no frontal sinus tenderness.  Mouth/Throat: Uvula is midline and mucous membranes are normal. Posterior oropharyngeal edema present. No oropharyngeal exudate.    2+   Eyes: Conjunctivae and EOM are normal. Pupils are equal, round, and reactive to light. No scleral icterus.  Neck: Normal range of motion and full passive range of motion without pain. Neck supple.  Cardiovascular: Normal rate, regular rhythm, normal heart sounds and intact distal pulses.  Exam reveals no gallop and no friction rub.   No murmur heard. Pulmonary/Chest: Effort normal and breath sounds normal.  Abdominal: Soft. Normal appearance and bowel sounds are normal. There is no hepatosplenomegaly. There is no tenderness. There is no rebound, no CVA tenderness, no tenderness at McBurney's point and negative Murphy's sign.  Lymphadenopathy:       Head (right side): No submandibular and no tonsillar adenopathy present.       Head (left side): No submandibular and no tonsillar adenopathy present.    She has no cervical adenopathy.       Right: No supraclavicular adenopathy present.       Left: No supraclavicular adenopathy present.  Neurological: She is alert and oriented to person, place, and time. She has normal strength. She displays tremor. No cranial nerve deficit or sensory deficit. She displays a negative  Romberg sign. Coordination and gait normal. GCS eye subscore is 4. GCS verbal subscore is 5. GCS motor subscore is 6.  Reflex Scores:      Bicep reflexes are 2+ on the right side and 2+ on the left side.      Patellar reflexes are 1+ on the right side and 1+ on the left side.      Achilles reflexes are 1+ on the right side and 1+ on the left side. Skin: Skin is warm and dry.  No rash noted. No cyanosis. No pallor.  Psychiatric: She has a normal mood and affect. Her behavior is normal. Judgment and thought content normal. Her speech is slurred. Cognition and memory are normal.  Mildly slurred   BP 100/70 mmHg  Pulse 88  Resp 16  Orthostatic vitals: not postural, see vitals Glucose finger stick: 182 (she ate within past hour)    Assessment & Plan:  Dysphagia  Hypotension, unspecified hypotension type - Plan: Basic metabolic panel, Orthostatic vital signs, POCT glucose (manual entry)  Dizziness - Plan: Glucose (CBG), CBC with Differential/Platelet, Basic metabolic panel, POCT glucose (manual entry)  Discussed with patient and husband that she is hypotensive today and this may be due to recent increase in blood pressure medication or possibly an adverse effect of the injection. She is not postural. She is neurologically grossly intact, no focal deficits. Do not suspect an acute neuro or cardiac event. Recommend that she stop lisinopril indefinitely and hold all HTN medications in the morning. Also recommend drinking plenty of fluids for the rest of today and returning tomorrow to see [place]. He is aware of this plan. She states she is planning to go to work tomorrow and cannot return for follow up until she gets off at 1pm. Discussed that if she has a worsening issue with swallowing or develops shortness of breath, lip edema or any other worrisome symptoms that she should call 911 or go to the emergency department. She and husband agree to this.

## 2015-07-24 ENCOUNTER — Encounter: Payer: Self-pay | Admitting: Medical

## 2015-07-24 ENCOUNTER — Ambulatory Visit (INDEPENDENT_AMBULATORY_CARE_PROVIDER_SITE_OTHER): Payer: BLUE CROSS/BLUE SHIELD | Admitting: Medical

## 2015-07-24 VITALS — BP 128/80 | HR 76 | Resp 14 | Ht 70.5 in | Wt 178.8 lb

## 2015-07-24 DIAGNOSIS — F251 Schizoaffective disorder, depressive type: Secondary | ICD-10-CM | POA: Diagnosis not present

## 2015-07-24 DIAGNOSIS — R4781 Slurred speech: Secondary | ICD-10-CM

## 2015-07-24 DIAGNOSIS — Z79899 Other long term (current) drug therapy: Secondary | ICD-10-CM

## 2015-07-24 DIAGNOSIS — H02403 Unspecified ptosis of bilateral eyelids: Secondary | ICD-10-CM | POA: Diagnosis not present

## 2015-07-24 DIAGNOSIS — K117 Disturbances of salivary secretion: Secondary | ICD-10-CM

## 2015-07-24 DIAGNOSIS — G2401 Drug induced subacute dyskinesia: Secondary | ICD-10-CM | POA: Diagnosis not present

## 2015-07-24 DIAGNOSIS — D649 Anemia, unspecified: Secondary | ICD-10-CM | POA: Diagnosis not present

## 2015-07-24 DIAGNOSIS — E876 Hypokalemia: Secondary | ICD-10-CM

## 2015-07-24 DIAGNOSIS — I10 Essential (primary) hypertension: Secondary | ICD-10-CM | POA: Diagnosis not present

## 2015-07-24 DIAGNOSIS — T783XXA Angioneurotic edema, initial encounter: Secondary | ICD-10-CM

## 2015-07-24 MED ORDER — POTASSIUM CHLORIDE ER 10 MEQ PO TBCR: 10.0000 meq | EXTENDED_RELEASE_TABLET | Freq: Every day | ORAL | Status: DC

## 2015-07-24 MED ORDER — ARIPIPRAZOLE 5 MG PO TABS: 5.0000 mg | ORAL_TABLET | Freq: Every day | ORAL | Status: DC

## 2015-07-24 MED ORDER — BENZTROPINE MESYLATE 1 MG PO TABS: 1.0000 mg | ORAL_TABLET | Freq: Every day | ORAL | Status: DC

## 2015-07-24 MED ORDER — AMLODIPINE BESYLATE 10 MG PO TABS: 10.0000 mg | ORAL_TABLET | Freq: Every day | ORAL | Status: DC

## 2015-07-24 MED ORDER — FLUOXETINE HCL 40 MG PO CAPS: 40.0000 mg | ORAL_CAPSULE | Freq: Every day | ORAL | Status: DC

## 2015-07-24 MED ORDER — ASPIRIN 81 MG PO CHEW: 81.0000 mg | CHEWABLE_TABLET | Freq: Every day | ORAL | Status: DC

## 2015-07-24 NOTE — Progress Notes (Signed)
Subjective: Chief Complaint  Patient presents with  . Follow-up    from yesterday, feels worse and is now drooling. Was on Haldol about 4 years ago and this made her drool excessively.    Here for 1 day f/u from visit here yesterday with Hetty BlendVickie Henson, NP.  Husband accompanies her today.   Mrs. [name] was hospitalized in psychiatric inpatient recently, was discharged 07/13/15.  She was discharged on Invega 3mg  daily, and was given injection of Invega the day of discharge per her and husband's report.   She is also taking Prozac 40mg  daily.   She doesn't think she was taking Cogentin since discharge though.  She has seen [facility] [place] since the discharge.   They haven't gotten those scripts filled, but looking at the scripts today, it appears she was changed to Invega 3mg , 1/2 tablet daily, was continued on Cogentin, and is suppose to have Invega injection every 4 weeks.      They note that 3 days ago she began having slurred speech, feeling swollen in the mouth and lips, increased drooling, tires easily, seems slow to react, although husband says otherwise she seems to be acting more her usual self, not having paranoia currently.  Denies unilateral weakness, vision or hearing loss, or other neuro concerns.    Past Medical History  Diagnosis Date  . History of renal stone   . Wears glasses   . Hypertension   . Depression     Dr. Mila HomerSena, [facility]; hospitalization 07/2010  . Obesity   . History of echocardiogram 2011    [facility]  . Hypokalemia   . Schizophrenia (HCC)    ROS as in subjective     Objective: BP 128/80 mmHg  Pulse 76  Resp 14  Ht 5' 10.5" (1.791 m)  Wt 178 lb 12.8 oz (81.103 kg)  BMI 25.28 kg/m2  General appearance: alert, no distress, WD/WN, AA female +lower lip swollen suggestive of angioedema, pharynx with slight angioedema, +excessive saliva/drooling present +bilat ptosis, pupils fixed, not seemingly reactive to light or accomodation, EOM seems  slow but is otherwise normal Slight tremor of lips noted.   Slight tremor with goal oriented motion, but not worse at end of the motion, DTRs 1+ throughout Otherwise  HEENT: normocephalic, sclerae anicteric, PERRLA, EOMi, nares patent, no discharge or erythema, pharynx normal Oral cavity: MMM, no lesions Neck: supple, no lymphadenopathy, no thyromegaly, no masse, no bruits Heart: RRR, normal S1, S2, no murmurs Lungs: CTA bilaterally, no wheezes, rhonchi, or rales Musculoskeletal: nontender, no swelling, no obvious deformity Extremities: no edema, no cyanosis, no clubbing Pulses: 2+ symmetric, upper and lower extremities, normal cap refill Neurological: alert, oriented x 3, other than the + above, CN2-12 intact, strength normal upper extremities and lower extremities, sensation normal throughout, , no cerebellar signs, gait normal Psychiatric: pleasant, answering questions appropriately, no paucity of speech as in last visit, seems to be mentally alert, but slowed or blunted res pones to questions/delayed  MMSE 28/30  FINDINGS:   MRI brain 07/03/15 Images are partially degraded by motion. The ventricles are non dilated. Extensive senescent or small vessel changes are seen in the periventricular and deep white matter significantly more pronounced than typical for age except in a patient with  underlying risk factors for accelerated cerebrovascular disease. No acute infarction. No mass effect or hemorrhage. No extra-axial fluid collection. No suspicious enhancement.   The pituitary, pineal region, and foramen magnum are unremarkable, and flow voids are present in the major  intracranial vessels and dural sinuses.   The orbits and paranasal sinuses are unremarkable.    CT brain 06/29/15 FINDINGS:  Brain volume and ventricular system are appropriate for the age of the patient. No hemorrhage, mass, midline shift, or large vessel ischemic changes. Paranasal sinuses are clear. deep periventricular  white matter ischemic changes.   Echocardiogram TTE 07/03/15 Interpretation Summary The left ventricle is normal in size. Ejection Fraction = >55%. There is mild concentric left ventricular hypertrophy. The right ventricle is normal size. The right ventricular ejection fraction is normal. Stage 1 diastolic function consistent with mildly impaired relaxation. Grossly normal valvular structure and function  EEG 06/30/15 Impression: This is an abnormal study due to diffuse background slowing of delta and  theta frequencies. This is nonspecific and can be seen in a global  encephalopathy from metabolic or toxic derangements, medication effect,  dementing or degenerative illnesses. There are no periodic discharges or  seizures captured. Clinical correlation is advised.    Assessment: Encounter Diagnoses  Name Primary?  . Slurred speech Yes  . Angioedema, initial encounter   . Ptosis, bilateral   . Drooling   . Tardive dyskinesia   . High risk medication use   . Schizoaffective disorder, depressive type (HCC)   . Essential hypertension   . Anemia, unspecified anemia type   . Hypokalemia      Plan: Complex situation given problems with medications, difficulty in communicating with psychiatry, and ruling out other possibilities.    I called and spoke to pharmacist at [facility] regarding medication interactions and side effects given her symptoms  Reviewed Vickie's notes from yesterday's visit here  I called to try and speak to psychiatry at [facility], spoke to nurse, was told I'd get a call back but never did by the end of the day (which is not unusual with that office, similar lack of communication as in the past).   Given the angioedema, this could be related to Paliperidone or Lisinopril, so I advised her to stop Paliperidone and she stopped Lisinopril yesterday.  I believe many of the other symptoms and findings such as slurred speech, tremor, slow response,  drooling, fatigue, is likely related to the Paliperidone.   Not 100% sure about the ptosis and pupil non reactivity, but possible medication interaction or medication side effect.   I did review recent studies from hospitalization as noted above including echo, EEG, brain CT and MRI.  They decline another head scan at this time.  Can't completely rule out stroke, but I suspect these symptoms are adverse effects of medication.    We will recheck in 1 week to give Paliperidone time to wash out of the system.  To help reduce paranoia and mood issues, c/t Prozac  daily, start the Cogentin to counter extrapyramidal effects, and begin Abilify.     Hypertension - increase to Norvasc  daily, c/t ASA  daily, hold on HCTZ and stop lisinopril.  C/t potassium daily.  Of note , she was hypotensive yesterday, but after holding medications, BP looks much better today.  reviewed her labs from yesterday.   Rashaun was seen today for follow-up.  Diagnoses and all orders for this visit:  Slurred speech  Angioedema, initial encounter  Ptosis, bilateral  Drooling  Tardive dyskinesia  High risk medication use  Schizoaffective disorder, depressive type (HCC)  Essential hypertension  Anemia, unspecified anemia type  Hypokalemia  Other orders -     amLODipine (NORVASC) 10 MG tablet; Take 1 tablet (  10 mg total) by mouth daily. Reported on 07/24/2015 -     aspirin 81 MG chewable tablet; Chew 1 tablet (81 mg total) by mouth daily. Reported on 07/24/2015 -     benztropine (COGENTIN) 1 MG tablet; Take 1 tablet (1 mg total) by mouth daily. -     FLUoxetine (PROZAC) 40 MG capsule; Take 1 capsule (40 mg total) by mouth daily. -     ARIPiprazole (ABILIFY) 5 MG tablet; Take 1 tablet (5 mg total) by mouth daily. -     potassium chloride (KLOR-CON 10) 10 MEQ tablet; Take 1 tablet (10 mEq total) by mouth daily.  recheck in 1 wk  Spent >45 minutes face to face with patient in discussion of symptoms,  evaluation, plan and recommendations.

## 2015-07-24 NOTE — Patient Instructions (Addendum)
Recommendations:  Continue Fluoxetine 40mg  daily (Prozac) for depression  Continue Amlodipine 10mg  daily for blood pressure  Add Klor Con 10mg  daily for potassium  START Cogentin 1mg  daily to counter act side effects of medications  START Abilify 5mg  daily for Schizophrenia  STOP the following:  Stop Lisinopril due to lip swelling  Stop Hydrochlorothiazide blood pressure medication for now  Stop Invega/Palpiperidone  Go see [facility] within the next 2 weeks, and see me in 1 week

## 2015-07-31 ENCOUNTER — Ambulatory Visit (INDEPENDENT_AMBULATORY_CARE_PROVIDER_SITE_OTHER): Payer: BLUE CROSS/BLUE SHIELD | Admitting: Medical

## 2015-07-31 ENCOUNTER — Encounter: Payer: Self-pay | Admitting: Medical

## 2015-07-31 VITALS — BP 118/80 | HR 86 | Wt 176.0 lb

## 2015-07-31 DIAGNOSIS — R29818 Other symptoms and signs involving the nervous system: Secondary | ICD-10-CM

## 2015-07-31 DIAGNOSIS — K117 Disturbances of salivary secretion: Secondary | ICD-10-CM | POA: Diagnosis not present

## 2015-07-31 DIAGNOSIS — T50905D Adverse effect of unspecified drugs, medicaments and biological substances, subsequent encounter: Secondary | ICD-10-CM

## 2015-07-31 DIAGNOSIS — T887XXD Unspecified adverse effect of drug or medicament, subsequent encounter: Secondary | ICD-10-CM | POA: Diagnosis not present

## 2015-07-31 DIAGNOSIS — F251 Schizoaffective disorder, depressive type: Secondary | ICD-10-CM

## 2015-07-31 DIAGNOSIS — R251 Tremor, unspecified: Secondary | ICD-10-CM | POA: Diagnosis not present

## 2015-07-31 DIAGNOSIS — T783XXD Angioneurotic edema, subsequent encounter: Secondary | ICD-10-CM | POA: Diagnosis not present

## 2015-07-31 NOTE — Patient Instructions (Signed)
   Recommendations;  Get a follow up appointment with [facility] within 2 weeks to reviewe medicaitons, particualr Abilify  For now contineu Norvasc 10mg  daily for blood pressure  Continue potassium 10mEq daily  Continue Prozac 40mg  daily for mood  Continue Cogentin 1mg  daily for tremor, muscle jerking related to mental health medicaitons  Continue Aspirin daily for heart health  Continue counseling.    Counseling services  [facility] [address], [place], [place] [address] [phone]   [facility] [address], [place], [place] [phone]   [facility] Sheran LuzDesiree Wilkinson, psychotherapist [address] [place], [place] [address] [phone]

## 2015-07-31 NOTE — Progress Notes (Signed)
Subjective: Chief Complaint  Patient presents with  . Follow-up    states she is feeling a lot better and is doing good. no problems or concerns.    Here for 1 week f/u.   I saw her a week ago for side effects from Paliperidone including angioedema, slurred speech, ptosis, tremor, slowed response to stimuli.   She feels much improved since stopping Paliperidine and starting Abilify.    She is here alone today.  She reports mood is ok, denies hallucinations, no delusions.   [facility]'t [facility] in the last week.      From last visit she had been hospitalized in psychiatric inpatient recently, was discharged 07/13/15.  She was discharged on Invega  daily, and was given injection of Invega the day of discharge per her and husband's report.   She is also taking Prozac  daily.   She doesn't think she was taking Cogentin since discharge though.  She has seen [facility] psychiat[facility] in [place] since the discharge.   They haven't gotten those scripts filled, but looking at the scripts today, it appears she was changed to [place] , 1/2 tablet daily, was continued on Cogentin, and is suppose to have Invega injection every 4 weeks.    Prior to last visit she had been having slurred speech, feeling swollen in the mouth and lips, increased drooling, tires easily, seems slow to react, although husband says otherwise she seems to be acting more her usual self, not having paranoia currently.  Denied unilateral weakness, vision or hearing loss, or other neuro concerns.    Past Medical History  Diagnosis Date  . History of renal stone   . Wears glasses   . Hypertension   . Depression     Dr. Mila Homer, [facility]; hospitalization 07/2010  . Obesity   . History of echocardiogram 2011    [facility]  . Hypokalemia   . Schizophrenia (HCC)    ROS as in subjective     Objective: BP 118/80 mmHg  Pulse 86  Wt 176 lb (79.833 kg)  General appearance: alert, no distress, WD/WN, AA female, she seems well groomed  and her "normal self" today, striking contrast to prior last 3 visits Minimal lower lip swelling today,  No other angioedema noted.  No excessive saliva/drooling present compared to last visit Improved ptosis, but pupils still seem fixed, not seemingly reactive to light or accomodation, EOM WNL No lip tremor noted.   Slight tremor with goal oriented motion of hands, but not worse at end of the motion, DTRs 1+ throughout Otherwise  HEENT: normocephalic, sclerae anicteric, PERRLA, EOMi, nares patent, no discharge or erythema, pharynx normal Oral cavity: MMM, no lesions Neck: supple, no lymphadenopathy, no thyromegaly, no masse, no bruits Musculoskeletal: nontender, no swelling, no obvious deformity Extremities: no edema, no cyanosis, no clubbing Pulses: 2+ symmetric, upper and lower extremities, normal cap refill Neurological: alert, oriented x 3, other than the + above, CN2-12 intact, strength normal upper extremities and lower extremities, sensation normal throughout, , no cerebellar signs, gait normal Psychiatric: pleasant, answering questions appropriately   Assessment: Encounter Diagnoses  Name Primary?  . Extrapyramidal symptom Yes  . Schizoaffective disorder, depressive type (HCC)   . Adverse effect of drug/medicinal, subsequent encounter   . Angioedema, subsequent encounter   . Tremor   . Drooling      Plan: Much improved today compared to last visit.   It would appear that she did not react well at all to the Paliperidine.  Now that she has discontinued this, she is much improved.   Advised she f/u with [facility], [facility].   Gave recommendations as below and advised recheck for physical and labs in 39mo.   Recommendations;  Get a follow up appointment with [facility] within 2 weeks to reviewed medications, particular Abilify  For now continue Norvasc 10mg  daily for blood pressure  Continue potassium 10mEq daily  Continue Prozac 40mg  daily for mood  Continue Cogentin 1mg   daily for tremor, muscle jerking related to mental health medications  Continue Aspirin daily for heart health  Continue counseling.   Of note, I have called [facility] last Friday, this past Monday and Tuesday, and no one has yet to respond to my concerns.  I will call again today to try and get [facility] to respond.

## 2015-08-23 ENCOUNTER — Other Ambulatory Visit: Payer: Self-pay | Admitting: Medical

## 2015-08-25 NOTE — Telephone Encounter (Signed)
Is this ok to refill?  

## 2015-10-03 ENCOUNTER — Other Ambulatory Visit: Payer: Self-pay | Admitting: Medical

## 2015-10-11 ENCOUNTER — Other Ambulatory Visit: Payer: Self-pay | Admitting: Medical

## 2015-10-12 NOTE — Telephone Encounter (Signed)
Is this ok to refill?  

## 2015-10-22 ENCOUNTER — Other Ambulatory Visit: Payer: Self-pay | Admitting: Medical

## 2015-10-23 NOTE — Telephone Encounter (Signed)
Attempted call to number on file and # is not accepting incoming calls.

## 2015-10-23 NOTE — Telephone Encounter (Signed)
Is this ok to refill?  

## 2015-10-23 NOTE — Telephone Encounter (Signed)
Refill but needs appt/physical

## 2015-10-26 ENCOUNTER — Telehealth: Payer: Self-pay | Admitting: Medical

## 2015-10-26 ENCOUNTER — Other Ambulatory Visit: Payer: Self-pay | Admitting: Medical

## 2015-10-26 MED ORDER — BENZTROPINE MESYLATE 1 MG PO TABS: 1.0000 mg | ORAL_TABLET | Freq: Every day | ORAL | 2 refills | Status: DC

## 2015-10-26 MED ORDER — AMLODIPINE BESYLATE 10 MG PO TABS: 10.0000 mg | ORAL_TABLET | Freq: Every day | ORAL | 2 refills | Status: DC

## 2015-10-26 NOTE — Telephone Encounter (Signed)
meds sent, also she needs f/u appt

## 2015-10-26 NOTE — Telephone Encounter (Signed)
Pt states she needs refills on Amlodipine and Cogentin. [facility].

## 2015-10-27 NOTE — Telephone Encounter (Signed)
LMTCB

## 2015-10-28 NOTE — Telephone Encounter (Signed)
Spoke to pt and she stated that she will call and make appt after talking with her husband, also stated when she makes appt she will make payments on her past due account

## 2015-12-13 ENCOUNTER — Other Ambulatory Visit: Payer: Self-pay | Admitting: Medical

## 2015-12-14 NOTE — Telephone Encounter (Signed)
Is this okay to refill? 

## 2015-12-14 NOTE — Telephone Encounter (Signed)
Send refill but needs appt

## 2015-12-28 ENCOUNTER — Other Ambulatory Visit: Payer: Self-pay | Admitting: Medical

## 2015-12-29 NOTE — Telephone Encounter (Signed)
Is this okay to refill? 

## 2016-01-09 ENCOUNTER — Other Ambulatory Visit: Payer: Self-pay | Admitting: Medical

## 2016-01-11 ENCOUNTER — Other Ambulatory Visit: Payer: Self-pay | Admitting: Medical

## 2016-01-11 NOTE — Telephone Encounter (Signed)
Is this okay to refill? 

## 2016-01-12 ENCOUNTER — Other Ambulatory Visit: Payer: Self-pay | Admitting: Medical

## 2016-01-12 ENCOUNTER — Telehealth: Payer: Self-pay | Admitting: Medical

## 2016-01-12 NOTE — Telephone Encounter (Signed)
Is this okay to refill? 

## 2016-01-12 NOTE — Telephone Encounter (Signed)
I have received several recent med refill request.   After the last requested several weeks ago I asked for f/u appt.  I don't want her running out of medication but she also needs appt ASAP as a follow up unless she is seeing a psychiatrist now!

## 2016-01-13 ENCOUNTER — Other Ambulatory Visit: Payer: Self-pay | Admitting: Medical

## 2016-01-13 MED ORDER — ASPIRIN EC 81 MG PO TBEC: 81.0000 mg | DELAYED_RELEASE_TABLET | Freq: Every day | ORAL | 3 refills | Status: DC

## 2016-01-13 MED ORDER — ARIPIPRAZOLE 5 MG PO TABS: 5.0000 mg | ORAL_TABLET | Freq: Every day | ORAL | 0 refills | Status: DC

## 2016-01-13 MED ORDER — BENZTROPINE MESYLATE 1 MG PO TABS: 1.0000 mg | ORAL_TABLET | Freq: Every day | ORAL | 0 refills | Status: DC

## 2016-01-13 MED ORDER — AMLODIPINE BESYLATE 10 MG PO TABS: 10.0000 mg | ORAL_TABLET | Freq: Every day | ORAL | 0 refills | Status: DC

## 2016-01-13 MED ORDER — POTASSIUM CHLORIDE ER 10 MEQ PO TBCR: 10.0000 meq | EXTENDED_RELEASE_TABLET | Freq: Every day | ORAL | 0 refills | Status: DC

## 2016-01-13 MED ORDER — FLUOXETINE HCL 40 MG PO CAPS: 40.0000 mg | ORAL_CAPSULE | Freq: Every day | ORAL | 0 refills | Status: DC

## 2016-01-13 NOTE — Telephone Encounter (Signed)
I understand, but we have some issues.  She really should be seeing a psychiatrist so if she can get established again with a different psychiatrist then she needs to do this. I recommend [facility], Dr. Evelene CroonKaur, or Dr. Donell BeersPlovsky  If she doesn't plan to do this, I at the very least have to see her [id number].  The last time I saw her was right after the hospitalization, and she was suppose to f/u a month later to make sure things are doing ok.    I'm sending a 30 day supply but she either has to establish with psychiatry (the preferred option), or come in here within a month.

## 2016-01-13 NOTE — Telephone Encounter (Signed)
Called pt and left message to call us back. Attempted to call her again an hour later but still no answer and also called her emergency contact, her husband, & was not able to leave a message on that number.

## 2016-01-13 NOTE — Telephone Encounter (Signed)
Pt returned my call stating that she has not been seeing a psychiatrist. She has just returned to work and have limited money so she really can not afford many doctor visits right now. Also with her just returning to work she can not take very much time off work. Pt & husband is also sharing one car for their jobs so pt is going to check to see if she can make arrangements to get here for an appointment so that she does not have to go without meds. She will want to discuss copay & bill payment arrangements at that same time as well.

## 2016-01-14 NOTE — Telephone Encounter (Signed)
Called patient to give lab results. No answer. 

## 2016-01-18 NOTE — Telephone Encounter (Signed)
LMTCB

## 2016-01-20 NOTE — Telephone Encounter (Signed)
LMTCB

## 2016-01-20 NOTE — Telephone Encounter (Signed)
Letter mailed to pt asking for CB. Amy Casey/RLB

## 2016-02-12 ENCOUNTER — Other Ambulatory Visit: Payer: Self-pay | Admitting: Medical

## 2016-02-15 NOTE — Telephone Encounter (Signed)
Is this okay to refill? 

## 2016-02-26 ENCOUNTER — Other Ambulatory Visit: Payer: Self-pay | Admitting: Medical

## 2016-03-24 ENCOUNTER — Other Ambulatory Visit: Payer: Self-pay | Admitting: Medical

## 2016-03-25 NOTE — Telephone Encounter (Signed)
Can this pat have a refill on this

## 2016-03-30 ENCOUNTER — Telehealth: Payer: Self-pay | Admitting: Family Medicine

## 2016-03-30 ENCOUNTER — Other Ambulatory Visit: Payer: Self-pay | Admitting: Medical

## 2016-03-30 NOTE — Telephone Encounter (Signed)
Pt had called up front wanting refills on her meds.  2 different issues.  Pt has been dismissed and pt was told on atleast 2 different occassions that she needs to be followed by her Psych for her meds and she has not gone and she also has not come in here for follow up appts.  Per Vincenza HewsShane no further refills will be given.   Pt has been told this in the past about no further refills  I called pt back, reached her voice mail.

## 2016-03-30 NOTE — Telephone Encounter (Signed)
Yes, I agree.  Unfortunately her mental issues are too complex for me to manage, and she really should be followed by psychiatry.  I certainly didn't mind handling their primary care needs (her and her husband have been patients here), but I didn't feel comfortable with the mental health medications.

## 2016-04-05 NOTE — Telephone Encounter (Signed)
Faxed request rcvd in office for fluoxentine and benztropine to [facility]. Correspondance [facility]ed [facility] to [facility] with documented dismissal and refusal of refills. Amy Casey

## 2016-04-06 ENCOUNTER — Other Ambulatory Visit: Payer: Self-pay | Admitting: Medical

## 2016-04-06 ENCOUNTER — Telehealth: Payer: Self-pay | Admitting: Family Medicine

## 2016-04-06 NOTE — Telephone Encounter (Signed)
Pt needs refill of Benztropine, Fluoxetine and Kdur to [facility] on [address]  Pt said to tell you Thank you

## 2016-04-07 ENCOUNTER — Other Ambulatory Visit: Payer: Self-pay | Admitting: Medical

## 2016-04-07 MED ORDER — POTASSIUM CHLORIDE ER 10 MEQ PO TBCR: 10.0000 meq | EXTENDED_RELEASE_TABLET | Freq: Every day | ORAL | 0 refills | Status: DC

## 2016-04-07 MED ORDER — AMLODIPINE BESYLATE 10 MG PO TABS: 10.0000 mg | ORAL_TABLET | Freq: Every day | ORAL | 0 refills | Status: DC

## 2016-04-07 MED ORDER — BENZTROPINE MESYLATE 1 MG PO TABS: 1.0000 mg | ORAL_TABLET | Freq: Every day | ORAL | 0 refills | Status: DC

## 2016-04-07 MED ORDER — ARIPIPRAZOLE 5 MG PO TABS: 5.0000 mg | ORAL_TABLET | Freq: Every day | ORAL | 0 refills | Status: DC

## 2016-04-07 MED ORDER — FLUOXETINE HCL 40 MG PO CAPS: 40.0000 mg | ORAL_CAPSULE | Freq: Every day | ORAL | 0 refills | Status: DC

## 2016-04-07 MED ORDER — ASPIRIN EC 81 MG PO TBEC: 81.0000 mg | DELAYED_RELEASE_TABLET | Freq: Every day | ORAL | 3 refills | Status: DC

## 2016-04-07 NOTE — Telephone Encounter (Signed)
Called pt to set up appt no answer

## 2016-04-07 NOTE — Telephone Encounter (Signed)
Pt called advised need office visit, she will call back and schedule.

## 2016-04-07 NOTE — Telephone Encounter (Signed)
meds sent, lets get her in for recheck within next 30 days

## 2016-05-14 ENCOUNTER — Other Ambulatory Visit: Payer: Self-pay | Admitting: Medical

## 2016-05-16 NOTE — Telephone Encounter (Signed)
Is is okay to refill? 

## 2016-06-02 ENCOUNTER — Encounter: Payer: Self-pay | Admitting: Family Medicine

## 2016-06-02 ENCOUNTER — Other Ambulatory Visit: Payer: Self-pay

## 2016-06-02 ENCOUNTER — Ambulatory Visit (INDEPENDENT_AMBULATORY_CARE_PROVIDER_SITE_OTHER): Payer: BLUE CROSS/BLUE SHIELD | Admitting: Family Medicine

## 2016-06-02 ENCOUNTER — Telehealth: Payer: Self-pay | Admitting: Medical

## 2016-06-02 VITALS — BP 130/80 | HR 66 | Temp 97.7°F | Wt 175.2 lb

## 2016-06-02 DIAGNOSIS — R350 Frequency of micturition: Secondary | ICD-10-CM

## 2016-06-02 LAB — POCT URINALYSIS DIPSTICK
Bilirubin, UA: NEGATIVE
Blood, UA: NEGATIVE
Glucose, UA: NEGATIVE
Ketones, UA: NEGATIVE
Leukocytes, UA: NEGATIVE
Nitrite, UA: NEGATIVE
Protein, UA: NEGATIVE
Spec Grav, UA: 1.025
Urobilinogen, UA: NEGATIVE
pH, UA: 6

## 2016-06-02 MED ORDER — FLUOXETINE HCL 40 MG PO CAPS: 40.0000 mg | ORAL_CAPSULE | Freq: Every day | ORAL | 0 refills | Status: DC

## 2016-06-02 MED ORDER — SULFAMETHOXAZOLE-TRIMETHOPRIM 800-160 MG PO TABS: 1.0000 | ORAL_TABLET | Freq: Two times a day (BID) | ORAL | 0 refills | Status: DC

## 2016-06-02 MED ORDER — BENZTROPINE MESYLATE 1 MG PO TABS: 1.0000 mg | ORAL_TABLET | Freq: Every day | ORAL | 0 refills | Status: DC

## 2016-06-02 MED ORDER — AMLODIPINE BESYLATE 10 MG PO TABS: 10.0000 mg | ORAL_TABLET | Freq: Every day | ORAL | 0 refills | Status: DC

## 2016-06-02 NOTE — Telephone Encounter (Signed)
Pt was told multiple times today in office to schedule an appointment. No appointment scheduled yet

## 2016-06-02 NOTE — Telephone Encounter (Signed)
Pt came in wanting to be seen by [facility] for a uti and medication. Pt situation was discussed with Vincenza HewsShane and [facility] Per Vincenza HewsShane sending message for refills. Pt was asked to make an appt with Vincenza HewsShane and she stated she would call back when she found out her schedule. Pt was given a card with CALL written in capital letters. Pt needs refills of Cogentin, Fluoxetine and amlodipine. Please send to [facility] on [address].

## 2016-06-02 NOTE — Progress Notes (Signed)
Subjective: Chief Complaint  Patient presents with  . UTI    UTI- frequency urination, going on like a month     Amy Casey is a 56 y.o. female who complains of possible urinary tract infection.  She has had intermittent symptoms for 1 month.  Symptoms include urinary frequency and urgency. Patient denies back pain, fever and stomach ache.  Last UTI was last year.   Using nothing for current symptoms.    Patient does not have a history of recurrent UTI. Patient does not have a history of pyelonephritis.  No other aggravating or relieving factors.  No other c/o.  Past Medical History:  Diagnosis Date  . Depression    Dr. Mila HomerSena, [facility]; hospitalization 07/2010  . History of echocardiogram 2011   [facility]  . History of renal stone   . Hypertension   . Hypokalemia   . Obesity   . Schizophrenia (HCC)   . Wears glasses     ROS as in subjective  Reviewed allergies, medications, past medical, surgical, and social history.    Objective: Vitals:   06/02/16 1221  BP: 130/80  Pulse: 66  Temp: 97.7 F (36.5 C)    General appearance: alert, no distress, WD/WN, female Abdomen: +bs, soft, non tender, non distended, no masses, no hepatomegaly, no splenomegaly, no bruits Back: no CVA tenderness GU: declines     Laboratory:  Urine dipstick: negative for all components.      Assessment: Frequent urination - Plan: Urinalysis Dipstick  Plan: Discussed symptoms, diagnosis, possible complications, and usual course of illness.  Bactrim prescribed.   Advised increased water intake, can use OTC Tylenol for pain.     Urine culture was not sent.    Call or return if worse or not improving.

## 2016-06-02 NOTE — Telephone Encounter (Signed)
error 

## 2016-06-02 NOTE — Telephone Encounter (Signed)
pls send refills on these listed and get her in with me ASAP

## 2016-07-14 ENCOUNTER — Other Ambulatory Visit: Payer: Self-pay | Admitting: Medical

## 2016-07-15 NOTE — Telephone Encounter (Signed)
Is this okay to refill? 

## 2016-07-20 ENCOUNTER — Other Ambulatory Visit: Payer: Self-pay | Admitting: Medical

## 2016-07-20 NOTE — Telephone Encounter (Signed)
Is this okay to refill? 

## 2016-07-22 ENCOUNTER — Telehealth: Payer: Self-pay | Admitting: Medical

## 2016-07-22 NOTE — Telephone Encounter (Signed)
Does she have appt with me scheduled or can we work her in today?

## 2016-07-22 NOTE — Telephone Encounter (Signed)
Pt has appt on Monday tried to call to see if she could come in today but no answer

## 2016-07-25 ENCOUNTER — Ambulatory Visit (INDEPENDENT_AMBULATORY_CARE_PROVIDER_SITE_OTHER): Payer: BLUE CROSS/BLUE SHIELD | Admitting: Medical

## 2016-07-25 ENCOUNTER — Encounter: Payer: Self-pay | Admitting: Medical

## 2016-07-25 VITALS — BP 130/82 | HR 67 | Wt 176.4 lb

## 2016-07-25 DIAGNOSIS — I10 Essential (primary) hypertension: Secondary | ICD-10-CM | POA: Diagnosis not present

## 2016-07-25 DIAGNOSIS — Z79899 Other long term (current) drug therapy: Secondary | ICD-10-CM | POA: Insufficient documentation

## 2016-07-25 DIAGNOSIS — F209 Schizophrenia, unspecified: Secondary | ICD-10-CM | POA: Diagnosis not present

## 2016-07-25 LAB — COMPREHENSIVE METABOLIC PANEL
ALT: 10 U/L (ref 6–29)
AST: 13 U/L (ref 10–35)
Albumin: 3.9 g/dL (ref 3.6–5.1)
Alkaline Phosphatase: 56 U/L (ref 33–130)
BUN: 18 mg/dL (ref 7–25)
CO2: 26 mmol/L (ref 20–31)
Calcium: 9.2 mg/dL (ref 8.6–10.4)
Chloride: 106 mmol/L (ref 98–110)
Creat: 0.76 mg/dL (ref 0.50–1.05)
Glucose, Bld: 83 mg/dL (ref 65–99)
Potassium: 3.7 mmol/L (ref 3.5–5.3)
Sodium: 142 mmol/L (ref 135–146)
Total Bilirubin: 0.3 mg/dL (ref 0.2–1.2)
Total Protein: 7 g/dL (ref 6.1–8.1)

## 2016-07-25 LAB — TSH: TSH: 0.91 mIU/L

## 2016-07-25 LAB — CBC
HCT: 37.1 % (ref 35.0–45.0)
Hemoglobin: 12 g/dL (ref 11.7–15.5)
MCH: 28.6 pg (ref 27.0–33.0)
MCHC: 32.3 g/dL (ref 32.0–36.0)
MCV: 88.3 fL (ref 80.0–100.0)
MPV: 9.1 fL (ref 7.5–12.5)
Platelets: 351 10*3/uL (ref 140–400)
RBC: 4.2 MIL/uL (ref 3.80–5.10)
RDW: 14.4 % (ref 11.0–15.0)
WBC: 6.2 10*3/uL (ref 4.0–10.5)

## 2016-07-25 LAB — LIPID PANEL
Cholesterol: 172 mg/dL (ref <200)
HDL: 63 mg/dL (ref >50)
LDL Cholesterol: 97 mg/dL (ref <100)
Total CHOL/HDL Ratio: 2.7 Ratio (ref <5.0)
Triglycerides: 62 mg/dL (ref <150)
VLDL: 12 mg/dL (ref <30)

## 2016-07-25 MED ORDER — AMLODIPINE BESYLATE 10 MG PO TABS: 10.0000 mg | ORAL_TABLET | Freq: Every day | ORAL | 1 refills | Status: DC

## 2016-07-25 MED ORDER — BENZTROPINE MESYLATE 1 MG PO TABS: 1.0000 mg | ORAL_TABLET | Freq: Every day | ORAL | 0 refills | Status: DC

## 2016-07-25 MED ORDER — ASPIRIN EC 81 MG PO TBEC
81.0000 mg | DELAYED_RELEASE_TABLET | Freq: Every day | ORAL | 3 refills | Status: DC
Start: 2016-07-25 — End: 2017-07-24

## 2016-07-25 MED ORDER — POTASSIUM CHLORIDE ER 10 MEQ PO TBCR: 10.0000 meq | EXTENDED_RELEASE_TABLET | Freq: Every day | ORAL | 1 refills | Status: DC

## 2016-07-25 MED ORDER — FLUOXETINE HCL 40 MG PO CAPS: 40.0000 mg | ORAL_CAPSULE | Freq: Every day | ORAL | 0 refills | Status: DC

## 2016-07-25 NOTE — Patient Instructions (Signed)
Psychiatrists:  Dr. Milagros Evener, MD [address] [phone]  Dorene Ar MD [phone] Address: [address] [place], [place] [address]   [facility] [phone] [address], [place], [place] [address]    Dr. Marguerita Beards, MD  [facility]) [address] [phone]

## 2016-07-25 NOTE — Progress Notes (Signed)
Subjective: Chief Complaint  Patient presents with  . med check    med check    Here for med check at my request.   Has hx/o schizophrenia, hospitalized a few years ago.  Been compliant with medication, but almost running out of medication.   She hasn't established with phychiatry lately.  Had not so good experiences with [facility] and [facility] in the past.    She lives in [place].  Still working at [employer], been there 4 years ago, works full time.  Execise - some, walking  Has 3 children. Husband has 10 grand kids, she has 5 on her side.   Her daughter and her grandchildren, 23yo and 1yo, stay with her and her family.   Her daughter is not always "around."  Thus, she and her husband and her mother in law help take care of the kids and grand kids collectively  She says they are safe and cared for.   Denies hearing voices lately.  Mood has been good.  Has been good as long as she doesn't run out of medication.  She notes that she is careful not to run out.   Is compliant with her BP medication.    No other new c/o.  Past Medical History:  Diagnosis Date  . Depression    Dr. Mila Homer, [facility]; hospitalization 07/2010  . History of echocardiogram 2011   [facility]  . History of renal stone   . Hypertension   . Hypokalemia   . Obesity   . Schizophrenia (HCC)   . Wears glasses    No current outpatient prescriptions on file prior to visit.   No current facility-administered medications on file prior to visit.    ROS as in subjective   Objective: BP 130/82   Pulse 67   Wt 176 lb 6.4 oz (80 kg)   SpO2 97%   BMI 24.95 kg/m   General appearance: alert, no distress, WD/WN,  Oral cavity: MMM, no lesions Neck: supple, no lymphadenopathy, no thyromegaly, no masses Heart: RRR, normal S1, S2, no murmurs Lungs: CTA bilaterally, no wheezes, rhonchi, or rales Pulses: 2+ symmetric, upper and lower extremities, normal cap refill Ext: no edema Pleasant, more of her self lately, good  eye contact, answers questions appropriately today   Assessment: Encounter Diagnoses  Name Primary?  . Essential hypertension, benign Yes  . Schizophrenia, unspecified type (HCC)   . High risk medication use      Plan: Schizophrenia - discussed importance of medication compliance, need to re-establish with psychiatry.   discussed symptoms that should prompt urgent evaluation.   C/t same medications, and gave list of psychiatrist to call for appt  HTN - labs today, c/t same medications  High risk medications - labs today   Paxton was seen today for med check.  Diagnoses and all orders for this visit:  Essential hypertension, benign -     Comprehensive metabolic panel -     CBC -     Lipid panel -     TSH  Schizophrenia, unspecified type (HCC)  High risk medication use -     Comprehensive metabolic panel -     CBC -     Lipid panel -     TSH  Other orders -     potassium chloride (K-DUR) 10 MEQ tablet; Take 1 tablet (10 mEq total) by mouth daily. -     benztropine (COGENTIN) 1 MG tablet; Take 1 tablet (1 mg total) by mouth daily. -  amLODipine (NORVASC) 10 MG tablet; Take 1 tablet (10 mg total) by mouth daily. -     FLUoxetine (PROZAC) 40 MG capsule; Take 1 capsule (40 mg total) by mouth daily. -     aspirin EC 81 MG tablet; Take 1 tablet (81 mg total) by mouth daily.

## 2016-11-08 ENCOUNTER — Emergency Department (HOSPITAL_COMMUNITY)
Admission: EM | Admit: 2016-11-08 | Discharge: 2016-11-11 | Disposition: A | Discharge status: Other Acute Inpatient Hospital | Payer: BLUE CROSS/BLUE SHIELD | Attending: Emergency Medicine | Admitting: Emergency Medicine

## 2016-11-08 ENCOUNTER — Encounter (HOSPITAL_COMMUNITY): Payer: Self-pay | Admitting: Emergency Medicine

## 2016-11-08 DIAGNOSIS — I10 Essential (primary) hypertension: Secondary | ICD-10-CM | POA: Insufficient documentation

## 2016-11-08 DIAGNOSIS — Z79899 Other long term (current) drug therapy: Secondary | ICD-10-CM | POA: Insufficient documentation

## 2016-11-08 DIAGNOSIS — Z0000 Encounter for general adult medical examination without abnormal findings: Secondary | ICD-10-CM

## 2016-11-08 DIAGNOSIS — Z7982 Long term (current) use of aspirin: Secondary | ICD-10-CM | POA: Insufficient documentation

## 2016-11-08 DIAGNOSIS — F200 Paranoid schizophrenia: Secondary | ICD-10-CM | POA: Diagnosis present

## 2016-11-08 LAB — CBC
HCT: 42.9 % (ref 36.0–46.0)
Hemoglobin: 14.6 g/dL (ref 12.0–15.0)
MCH: 28.9 pg (ref 26.0–34.0)
MCHC: 34 g/dL (ref 30.0–36.0)
MCV: 85 fL (ref 78.0–100.0)
Platelets: 430 10*3/uL — ABNORMAL HIGH (ref 150–400)
RBC: 5.05 MIL/uL (ref 3.87–5.11)
RDW: 12.6 % (ref 11.5–15.5)
WBC: 9.1 10*3/uL (ref 4.0–10.5)

## 2016-11-08 LAB — COMPREHENSIVE METABOLIC PANEL
ALT: 20 U/L (ref 14–54)
AST: 22 U/L (ref 15–41)
Albumin: 4.5 g/dL (ref 3.5–5.0)
Alkaline Phosphatase: 76 U/L (ref 38–126)
Anion gap: 17 — ABNORMAL HIGH (ref 5–15)
BUN: 38 mg/dL — ABNORMAL HIGH (ref 6–20)
CO2: 25 mmol/L (ref 22–32)
Calcium: 9.6 mg/dL (ref 8.9–10.3)
Chloride: 102 mmol/L (ref 101–111)
Creatinine, Ser: 1.1 mg/dL — ABNORMAL HIGH (ref 0.44–1.00)
GFR calc Af Amer: >60 mL/min (ref >60)
GFR calc non Af Amer: 55 mL/min — ABNORMAL LOW (ref >60)
Glucose, Bld: 94 mg/dL (ref 65–99)
Potassium: 3 mmol/L — ABNORMAL LOW (ref 3.5–5.1)
Sodium: 144 mmol/L (ref 135–145)
Total Bilirubin: 0.9 mg/dL (ref 0.3–1.2)
Total Protein: 8.6 g/dL — ABNORMAL HIGH (ref 6.5–8.1)

## 2016-11-08 LAB — ETHANOL: Alcohol, Ethyl (B): <5 mg/dL (ref <5)

## 2016-11-08 LAB — ACETAMINOPHEN LEVEL: Acetaminophen (Tylenol), Serum: <10 ug/mL — ABNORMAL LOW (ref 10–30)

## 2016-11-08 LAB — SALICYLATE LEVEL: Salicylate Lvl: <7 mg/dL (ref 2.8–30.0)

## 2016-11-08 LAB — I-STAT BETA HCG BLOOD, ED (MC, WL, AP ONLY): I-stat hCG, quantitative: <5 m[IU]/mL (ref <5)

## 2016-11-08 MED ORDER — ASPIRIN EC 81 MG PO TBEC
81.0000 mg | DELAYED_RELEASE_TABLET | Freq: Every day | ORAL | Status: DC
  Administered 2016-11-11: 81 mg via ORAL
  Filled 2016-11-08 (×4): qty 1

## 2016-11-08 MED ORDER — FLUOXETINE HCL 20 MG PO CAPS
40.0000 mg | ORAL_CAPSULE | Freq: Every day | ORAL | Status: DC
  Administered 2016-11-11: 40 mg via ORAL
  Filled 2016-11-08 (×4): qty 2

## 2016-11-08 MED ORDER — BENZTROPINE MESYLATE 1 MG PO TABS
1.0000 mg | ORAL_TABLET | Freq: Every day | ORAL | Status: DC
  Administered 2016-11-11: 1 mg via ORAL
  Filled 2016-11-08 (×3): qty 1

## 2016-11-08 MED ORDER — POTASSIUM CHLORIDE ER 10 MEQ PO TBCR
10.0000 meq | EXTENDED_RELEASE_TABLET | Freq: Every day | ORAL | Status: DC
  Administered 2016-11-11: 10 meq via ORAL
  Filled 2016-11-08 (×8): qty 1

## 2016-11-08 MED ORDER — AMLODIPINE BESYLATE 5 MG PO TABS
10.0000 mg | ORAL_TABLET | Freq: Every day | ORAL | Status: DC
  Administered 2016-11-11: 10 mg via ORAL
  Filled 2016-11-08 (×3): qty 2

## 2016-11-08 MED ORDER — POTASSIUM CHLORIDE CRYS ER 20 MEQ PO TBCR
40.0000 meq | EXTENDED_RELEASE_TABLET | Freq: Once | ORAL | Status: DC
  Filled 2016-11-08: qty 2

## 2016-11-08 MED ORDER — SODIUM CHLORIDE 0.9 % IV BOLUS (SEPSIS)
1000.0000 mL | Freq: Once | INTRAVENOUS | Status: AC
  Administered 2016-11-08: 1000 mL via INTRAVENOUS

## 2016-11-08 NOTE — ED Notes (Signed)
Husband at bedside notified that pt will remain here in the TCU for the night and then will be evaluated in the am by psychiatry. Husband states that he will be working in am but can be reached at [phone].

## 2016-11-08 NOTE — ED Notes (Signed)
Pt refusing 2nd liter of NS bolus.

## 2016-11-08 NOTE — ED Notes (Addendum)
Berna SpareMarcus calling asking to providing Tele Psych assessment at this time. SAPU NT to place machine in pt's room when it is no longer in use with another pt.

## 2016-11-08 NOTE — ED Provider Notes (Signed)
[facility]-EMERGENCY DEPT Provider Note   CSN: [id number] Arrival date & time: 11/08/16  1622     History   Chief Complaint Chief Complaint  Patient presents with  . IVC    HPI Buffi Ewton is a 56 y.o. female.  56 year old female who presents under IVC due to not taken her medications and having increased paranoia. She is also not been eating or drinking appropriately. Patient denies any suicidal or homicidal ideations. States that she has been responding to internal stimuli. Denies any abdominal or chest discomfort. No recent alcohol or drug use. Patient is very evasive in her history. No further history obtainable due to her current state      Past Medical History:  Diagnosis Date  . Depression    Dr. Mila Homer, [facility]; hospitalization 07/2010  . History of echocardiogram 2011   [facility]  . History of renal stone   . Hypertension   . Hypokalemia   . Obesity   . Schizophrenia (HCC)   . Wears glasses     Patient Active Problem List   Diagnosis Date Noted  . High risk medication use 07/25/2016  . Involuntary commitment   . Schizophrenia (HCC) 06/16/2015  . Noncompliance 06/16/2015  . Altered awareness, transient   . Altered mental status 02/19/2014  . Essential hypertension, benign 02/10/2012  . Tooth decay 02/10/2012  . Obesity 02/10/2012  . Impaired fasting blood sugar 02/10/2012    Past Surgical History:  Procedure Laterality Date  . WISDOM TOOTH EXTRACTION      OB History    No data available       Home Medications    Prior to Admission medications   Medication Sig Start Date End Date Taking? Authorizing Provider  amLODipine (NORVASC) 10 MG tablet Take 1 tablet (10 mg total) by mouth daily. 07/25/16   Tysinger, Kermit Balo, PA-C  aspirin EC 81 MG tablet Take 1 tablet (81 mg total) by mouth daily. 07/25/16   Tysinger, Kermit Balo, PA-C  benztropine (COGENTIN) 1 MG tablet Take 1 tablet (1 mg total) by mouth daily. 07/25/16   Tysinger, Kermit Balo, PA-C  FLUoxetine  (PROZAC) 40 MG capsule Take 1 capsule (40 mg total) by mouth daily. 07/25/16   Tysinger, Kermit Balo, PA-C  potassium chloride (K-DUR) 10 MEQ tablet Take 1 tablet (10 mEq total) by mouth daily. 07/25/16   Tysinger, Kermit Balo, PA-C    Family History Family History  Problem Relation Age of Onset  . Diabetes Mother   . Kidney disease Mother        dialysis  . Heart disease Mother   . Cancer Maternal Uncle        lung  . Stroke Neg Hx   . Hypertension Neg Hx   . Hyperlipidemia Neg Hx     Social History Social History  Substance Use Topics  . Smoking status: Never Smoker  . Smokeless tobacco: Never Used  . Alcohol use No     Allergies   Lisinopril and Paliperidone   Review of Systems Review of Systems  Unable to perform ROS: Psychiatric disorder     Physical Exam Updated Vital Signs BP 139/74 (BP Location: Left Arm)   Pulse 89   Temp 98.7 F (37.1 C) (Oral)   Resp 16   SpO2 100%   Physical Exam  Constitutional: She is oriented to person, place, and time. She appears well-developed and well-nourished.  Non-toxic appearance. No distress.  HENT:  Head: Normocephalic and atraumatic.  Eyes: Pupils are equal,  round, and reactive to light. Conjunctivae, EOM and lids are normal.  Neck: Normal range of motion. Neck supple. No tracheal deviation present. No thyroid mass present.  Cardiovascular: Normal rate, regular rhythm and normal heart sounds.  Exam reveals no gallop.   No murmur heard. Pulmonary/Chest: Effort normal and breath sounds normal. No stridor. No respiratory distress. She has no decreased breath sounds. She has no wheezes. She has no rhonchi. She has no rales.  Abdominal: Soft. Normal appearance and bowel sounds are normal. She exhibits no distension. There is no tenderness. There is no rebound and no CVA tenderness.  Musculoskeletal: Normal range of motion. She exhibits no edema or tenderness.  Neurological: She is alert and oriented to person, place, and time. She  has normal strength. No cranial nerve deficit or sensory deficit. GCS eye subscore is 4. GCS verbal subscore is 5. GCS motor subscore is 6.  Skin: Skin is warm and dry. No abrasion and no rash noted.  Psychiatric: Her affect is blunt and inappropriate. Her speech is delayed. She is slowed and withdrawn. She expresses no suicidal plans and no homicidal plans.  Nursing note and vitals reviewed.    ED Treatments / Results  Labs (all labs ordered are listed, but only abnormal results are displayed) Labs Reviewed  COMPREHENSIVE METABOLIC PANEL  ETHANOL  SALICYLATE LEVEL  ACETAMINOPHEN LEVEL  CBC  RAPID URINE DRUG SCREEN, HOSP PERFORMED  I-STAT BETA HCG BLOOD, ED ([facility], [facility], [facility] ONLY)    EKG  EKG Interpretation None       Radiology No results found.  Procedures Procedures (including critical care time)  Medications Ordered in ED Medications  sodium chloride 0.9 % bolus 1,000 mL (not administered)  sodium chloride 0.9 % bolus 1,000 mL (not administered)     Initial Impression / Assessment and Plan / ED Course  I have reviewed the triage vital signs and the nursing notes.  Pertinent labs & imaging results that were available during my care of the patient were reviewed by me and considered in my medical decision making (see chart for details).     Patient given oral potassium for her mild hypokalemia. Was treated with 2 L IV fluidsfor suspected dehydration as evidenced by her elevated BUN/creatinine. She is now medically cleared for psychiatric disposition  Final Clinical Impressions(s) / ED Diagnoses   Final diagnoses:  None    New Prescriptions New Prescriptions   No medications on file     Lorre NickAllen, Meggan Dhaliwal, MD 11/08/16 1831

## 2016-11-08 NOTE — ED Notes (Signed)
Paula Dunlap-Patient's [phone]
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
## 2016-11-08 NOTE — ED Triage Notes (Signed)
Pt BIB [facility] IVC.  Pt has paranoid schizophrenia.  Pt believes people are out to harm her.  Has not been eating or drinking.

## 2016-11-09 ENCOUNTER — Emergency Department (HOSPITAL_COMMUNITY): Payer: Self-pay

## 2016-11-09 DIAGNOSIS — R443 Hallucinations, unspecified: Secondary | ICD-10-CM

## 2016-11-09 DIAGNOSIS — F200 Paranoid schizophrenia: Secondary | ICD-10-CM

## 2016-11-09 DIAGNOSIS — F22 Delusional disorders: Secondary | ICD-10-CM | POA: Diagnosis not present

## 2016-11-09 DIAGNOSIS — F419 Anxiety disorder, unspecified: Secondary | ICD-10-CM | POA: Diagnosis not present

## 2016-11-09 LAB — URINALYSIS, ROUTINE W REFLEX MICROSCOPIC
Bilirubin Urine: NEGATIVE
Glucose, UA: NEGATIVE mg/dL
Ketones, ur: 80 mg/dL — AB
Leukocytes, UA: NEGATIVE
Nitrite: NEGATIVE
Protein, ur: NEGATIVE mg/dL
Specific Gravity, Urine: 1.017 (ref 1.005–1.030)
pH: 6 (ref 5.0–8.0)

## 2016-11-09 LAB — BASIC METABOLIC PANEL
Anion gap: 15 (ref 5–15)
BUN: 27 mg/dL — ABNORMAL HIGH (ref 6–20)
CO2: 23 mmol/L (ref 22–32)
Calcium: 9.5 mg/dL (ref 8.9–10.3)
Chloride: 109 mmol/L (ref 101–111)
Creatinine, Ser: 0.83 mg/dL (ref 0.44–1.00)
GFR calc Af Amer: >60 mL/min (ref >60)
GFR calc non Af Amer: >60 mL/min (ref >60)
Glucose, Bld: 76 mg/dL (ref 65–99)
Potassium: 3.1 mmol/L — ABNORMAL LOW (ref 3.5–5.1)
Sodium: 147 mmol/L — ABNORMAL HIGH (ref 135–145)

## 2016-11-09 LAB — RAPID URINE DRUG SCREEN, HOSP PERFORMED
Amphetamines: NOT DETECTED
Barbiturates: NOT DETECTED
Benzodiazepines: NOT DETECTED
Cocaine: NOT DETECTED
Opiates: NOT DETECTED
Tetrahydrocannabinol: NOT DETECTED

## 2016-11-09 MED ORDER — OLANZAPINE 5 MG PO TBDP
5.0000 mg | ORAL_TABLET | Freq: Two times a day (BID) | ORAL | Status: DC
Start: 2016-11-09 — End: 2016-11-11
  Administered 2016-11-10 – 2016-11-11 (×2): 5 mg via ORAL
  Filled 2016-11-09 (×5): qty 1

## 2016-11-09 NOTE — Progress Notes (Addendum)
Amy Casey from [facility] stated Amy Casey would like to know if [facility] EDP could provide another potassium level determination to determine the pt's appropriateness for their facility at this time.  Per char pt has a level of 3.0 and bottom of normal range is 3.5 and pt has been receiving potassium orally, per notes, stated Amy Danielsowan.  CSW will speak to EDP and request.  5:56 PM CSW spoke to EDP who will request potassium level for pt.   CSW awaiting results.  Dorothe PeaJonathan F. Idara Woodside, Francesco SorLCSWA, LCAS, CSI Clinical Social Worker Ph: [phone]

## 2016-11-09 NOTE — Progress Notes (Addendum)
CSW received another [facility] stated Amy Casey is unable to take pt's past 11pm and pt's RN is attempting to get a blood draw as soon as possible.  Per Britta MccreedyBarbara, [facility] wants to see that pt's potassium levels are "trending upward", before accepting the pt.  Per RN, pt has refused his last oral dose of potassium and his last level taken was 3.0 on 8/14.  CSW will continue to follow.  9:30 PM   CSW awaiting results from blood draw for potassium.  Amy PeaJonathan F. [name], Amy SorLCSWA, LCAS, CSI Clinical Social Worker Ph: [phone]

## 2016-11-09 NOTE — Consult Note (Signed)
[place] Psychiatry Consult   Reason for Consult:  Psychiatric evaluation Referring Physician:  evaluation Patient Identification: Amy Casey MRN:  [id number] Principal Diagnosis: Paranoid schizophrenia [facility]) Diagnosis:   Patient Active Problem List   Diagnosis Date Noted  . Paranoid schizophrenia ([place]) [F20.0] 06/16/2015    Priority: High  . High risk medication use [Z79.899] 07/25/2016  . Involuntary commitment [Z04.6]   . Noncompliance [Z91.19] 06/16/2015  . Altered awareness, transient [R40.4]   . Altered mental status [R41.82] 02/19/2014  . Essential hypertension, benign [I10] 02/10/2012  . Tooth decay [K02.9] 02/10/2012  . Obesity [E66.9] 02/10/2012  . Impaired fasting blood sugar [R73.01] 02/10/2012    Total Time spent with patient: 45 minutes  Subjective:   Amy Casey is a 56 y.o. female patient admitted with worsening delusions.  HPI:  Patient with history Paranoid Schizophrenia who was IVC'd by her family due to increased delusions, paranoia and disorganized behavior. Patient is a poor historian but she reports hearing voices but unable to elaborate. Family also reports that patient has been neglecting her hygiene, not sleeping and not eating because she thinks her food is poision. medications and having increased paranoia. She is also not been eating or drinking appropriately.   Past Psychiatric History: as above  Risk to Self: Suicidal Ideation: No Suicidal Intent: No Is patient at risk for suicide?: No Suicidal Plan?: No Access to Means: No What has been your use of drugs/alcohol within the last 12 months?: None How many times?: 1 Other Self Harm Risks: None Triggers for Past Attempts: Unknown Intentional Self Injurious Behavior: None Risk to Others: Homicidal Ideation: No Thoughts of Harm to Others: No Current Homicidal Intent: No Current Homicidal Plan: No Access to Homicidal Means: No Identified Victim: No one History of harm to  others?: No Assessment of Violence: None Noted Violent Behavior Description: None reported Does patient have access to weapons?: No Criminal Charges Pending?: No Does patient have a court date: No Prior Inpatient Therapy: Prior Inpatient Therapy: Yes Prior Therapy Dates: 2016 Prior Therapy Facilty/Provider(s): [facility] Reason for Treatment: psychosis Prior Outpatient Therapy: Prior Outpatient Therapy: Yes Prior Therapy Dates: Two years or more ago. Prior Therapy Facilty/Provider(s): [facility] Jordan [facility] Reason for Treatment: med management Does patient have an ACCT team?: No Does patient have Intensive In-House Services?  : No Does patient have [facility] services? : No Does patient have P4CC services?: No  Past Medical History:  Past Medical History:  Diagnosis Date  . Depression    Dr. Silvio Pate, [place]; hospitalization 07/2010  . History of echocardiogram 2011   [facility]  . History of renal stone   . Hypertension   . Hypokalemia   . Obesity   . Schizophrenia ([place])   . Wears glasses     Past Surgical History:  Procedure Laterality Date  . WISDOM TOOTH EXTRACTION     Family History:  Family History  Problem Relation Age of Onset  . Diabetes Mother   . Kidney disease Mother        dialysis  . Heart disease Mother   . Cancer Maternal Uncle        lung  . Stroke Neg Hx   . Hypertension Neg Hx   . Hyperlipidemia Neg Hx    Family Psychiatric  History:  Social History:  History  Alcohol Use No     History  Drug Use No    Social History   Social History  . Marital status: Married    Spouse name:  N/A  . Number of children: N/A  . Years of education: N/A   Social History Main Topics  . Smoking status: Never Smoker  . Smokeless tobacco: Never Used  . Alcohol use No  . Drug use: No  . Sexual activity: Not Asked   Other Topics Concern  . None   Social History Narrative   Married, 2 children, mother in law lives with them, was working as a Horticulturist, commercial  Social History:    Allergies:   Allergies  Allergen Reactions  . Paliperidone     Angioedema, drooling, slurred speech, ptosis  . Lisinopril Rash    Angioedema     Labs:  Results for orders placed or performed during the hospital encounter of 11/08/16 (from the past 48 hour(s))  Comprehensive metabolic panel     Status: Abnormal   Collection Time: 11/08/16  5:18 PM  Result Value Ref Range   Sodium 144 135 - 145 mmol/L   Potassium 3.0 (L) 3.5 - 5.1 mmol/L   Chloride 102 101 - 111 mmol/L   CO2 25 22 - 32 mmol/L   Glucose, Bld 94 65 - 99 mg/dL   BUN 38 (H) 6 - 20 mg/dL   Creatinine, Ser 1.10 (H) 0.44 - 1.00 mg/dL   Calcium 9.6 8.9 - 10.3 mg/dL   Total Protein 8.6 (H) 6.5 - 8.1 g/dL   Albumin 4.5 3.5 - 5.0 g/dL   AST 22 15 - 41 U/L   ALT 20 14 - 54 U/L   Alkaline Phosphatase 76 38 - 126 U/L   Total Bilirubin 0.9 0.3 - 1.2 mg/dL   GFR calc non Af Amer 55 (L) >60 mL/min   GFR calc Af Amer >60 >60 mL/min    Comment: (NOTE) The eGFR has been calculated using the CKD EPI equation. This calculation has not been validated in all clinical situations. eGFR's persistently <60 mL/min signify possible Chronic Kidney Disease.    Anion gap 17 (H) 5 - 15  Ethanol     Status: None   Collection Time: 11/08/16  5:18 PM  Result Value Ref Range   Alcohol, Ethyl (B) <5 <5 mg/dL    Comment:        LOWEST DETECTABLE LIMIT FOR SERUM ALCOHOL IS 5 mg/dL FOR MEDICAL PURPOSES ONLY   Salicylate level     Status: None   Collection Time: 11/08/16  5:18 PM  Result Value Ref Range   Salicylate Lvl <9.4 2.8 - 30.0 mg/dL  Acetaminophen level     Status: Abnormal   Collection Time: 11/08/16  5:18 PM  Result Value Ref Range   Acetaminophen (Tylenol), Serum <10 (L) 10 - 30 ug/mL    Comment:        THERAPEUTIC CONCENTRATIONS VARY SIGNIFICANTLY. A RANGE OF 10-30 ug/mL MAY BE AN EFFECTIVE CONCENTRATION FOR MANY PATIENTS. HOWEVER, SOME ARE BEST TREATED AT CONCENTRATIONS OUTSIDE  THIS RANGE. ACETAMINOPHEN CONCENTRATIONS >150 ug/mL AT 4 HOURS AFTER INGESTION AND >50 ug/mL AT 12 HOURS AFTER INGESTION ARE OFTEN ASSOCIATED WITH TOXIC REACTIONS.   cbc     Status: Abnormal   Collection Time: 11/08/16  5:18 PM  Result Value Ref Range   WBC 9.1 4.0 - 10.5 K/uL   RBC 5.05 3.87 - 5.11 MIL/uL   Hemoglobin 14.6 12.0 - 15.0 g/dL   HCT 42.9 36.0 - 46.0 %   MCV 85.0 78.0 - 100.0 fL   MCH 28.9 26.0 - 34.0 pg   MCHC 34.0 30.0 - 36.0  g/dL   RDW 12.6 11.5 - 15.5 %   Platelets 430 (H) 150 - 400 K/uL  I-Stat beta hCG blood, ED     Status: None   Collection Time: 11/08/16  5:35 PM  Result Value Ref Range   I-stat hCG, quantitative <5.0 <5 mIU/mL   Comment 3            Comment:   GEST. AGE      CONC.  (mIU/mL)   <=1 WEEK        5 - 50     2 WEEKS       50 - 500     3 WEEKS       100 - 10,000     4 WEEKS     1,000 - 30,000        FEMALE AND NON-PREGNANT FEMALE:     LESS THAN 5 mIU/mL     Current Facility-Administered Medications  Medication Dose Route Frequency Provider Last Rate Last Dose  . amLODipine (NORVASC) tablet 10 mg  10 mg Oral Daily Lacretia Leigh, MD      . aspirin EC tablet 81 mg  81 mg Oral Daily Lacretia Leigh, MD      . benztropine (COGENTIN) tablet 1 mg  1 mg Oral Daily Lacretia Leigh, MD      . FLUoxetine (PROZAC) capsule 40 mg  40 mg Oral Daily Lacretia Leigh, MD      . OLANZapine zydis (ZYPREXA) disintegrating tablet 5 mg  5 mg Oral BID Marguerite Jarboe, MD      . potassium chloride (K-DUR) CR tablet 10 mEq  10 mEq Oral Daily Lacretia Leigh, MD      . potassium chloride SA (K-DUR,KLOR-CON) CR tablet 40 mEq  40 mEq Oral Once Lacretia Leigh, MD       Current Outpatient Prescriptions  Medication Sig Dispense Refill  . amLODipine (NORVASC) 10 MG tablet Take 1 tablet (10 mg total) by mouth daily. 90 tablet 1  . aspirin EC 81 MG tablet Take 1 tablet (81 mg total) by mouth daily. 90 tablet 3  . benztropine (COGENTIN) 1 MG tablet Take 1 tablet (1 mg  total) by mouth daily. 90 tablet 0  . FLUoxetine (PROZAC) 40 MG capsule Take 1 capsule (40 mg total) by mouth daily. 90 capsule 0  . potassium chloride (K-DUR) 10 MEQ tablet Take 1 tablet (10 mEq total) by mouth daily. 90 tablet 1    Musculoskeletal: Strength & Muscle Tone: within normal limits Gait & Station: normal Patient leans: N/A  Psychiatric Specialty Exam: Physical Exam  Psychiatric: Judgment normal. Her affect is blunt. Her speech is delayed. She is actively hallucinating. Thought content is paranoid. Cognition and memory are normal. She expresses suicidal ideation.    Review of Systems  Constitutional: Negative.   HENT: Negative.   Eyes: Negative.   Respiratory: Negative.   Cardiovascular: Negative.   Gastrointestinal: Negative.   Genitourinary: Negative.   Musculoskeletal: Negative.   Skin: Negative.   Neurological: Negative.   Endo/Heme/Allergies: Negative.   Psychiatric/Behavioral: Positive for hallucinations and suicidal ideas.    Blood pressure (!) 159/84, pulse 86, temperature 98.4 F (36.9 C), temperature source Oral, resp. rate 16, SpO2 99 %.There is no height or weight on file to calculate BMI.  General Appearance: Casual  Eye Contact:  Minimal  Speech:  Slow  Volume:  Decreased  Mood:  Dysphoric  Affect:  Constricted  Thought Process:  Disorganized  Orientation:  Full (Time, Place, and  Person)  Thought Content:  Illogical, Hallucinations: Auditory and Paranoid Ideation  Suicidal Thoughts:  No  Homicidal Thoughts:  No  Memory:  Immediate;   Fair Recent;   Fair Remote;   Fair  Judgement:  Poor  Insight:  Shallow  Psychomotor Activity:  Psychomotor Retardation  Concentration:  Concentration: Fair and Attention Span: Fair  Recall:  [employer] of Knowledge:  Fair  Language:  Good  Akathisia:  No  Handed:  Right  AIMS (if indicated):     Assets:  Communication Skills Social Support  ADL's:  Intact  Cognition:  WNL  Sleep:   poor      Treatment Plan Summary: Daily contact with patient to assess and evaluate symptoms and progress in treatment and Medication management  Start Zyprexa 5 mg bid for psychosis/delusions  Disposition: Recommend psychiatric Inpatient admission when medically cleared.  Corena Pilgrim, MD 11/09/2016 11:34 AM

## 2016-11-09 NOTE — ED Notes (Signed)
Patient refusing to go to radiology for chest xray.  Changed order to portable.

## 2016-11-09 NOTE — ED Notes (Signed)
Pt is sitting on the side of the bed-and is quiet.  Continues to refuse everything.

## 2016-11-09 NOTE — ED Notes (Signed)
Family at bedside. 

## 2016-11-09 NOTE — ED Notes (Signed)
Patient states that she wasn't hungry so didn't eat any of her breakfast tray.

## 2016-11-09 NOTE — Progress Notes (Signed)
Artist PaisShawna [facility] called to see if the pt still needs a bed.  CSW affirmed pt does.  E[facility]ene [facility] will call back. Shawna asked if pt needed [facility] or could [facility].  CSW stated he did not know but Earlene PlaterDavis could call disposition in the morning.  Please reconsult if future social work needs arise.  CSW signing off, as social work intervention is no longer needed.  Dorothe PeaJonathan F. Halina Asano, Francesco SorLCSWA, LCAS, CSI Clinical Social Worker Ph: [phone]

## 2016-11-09 NOTE — BH Assessment (Signed)
[facility] Assessment Progress Note  Per Thedore MinsMojeed Akintayo, MD, this pt requires psychiatric hospitalization at this time.  Pt presents under IVC initiated by her sister, and upheld by Dr Jannifer FranklinAkintayo.  The following facilities have been contacted to seek placement for this pt, with results as noted:  Beds available, information sent, decision pending:  [facility]   At capacity:  [facility]    Terrick Allred, [place] Triage Specialist [phone]

## 2016-11-09 NOTE — ED Notes (Addendum)
Pt assisted to the BR, urinated in a hat, was able to obtain random urine specimen

## 2016-11-09 NOTE — ED Notes (Signed)
Pt is resting comfortably, calm at this time.  Sitter at bedside.

## 2016-11-10 DIAGNOSIS — F29 Unspecified psychosis not due to a substance or known physiological condition: Secondary | ICD-10-CM

## 2016-11-10 DIAGNOSIS — F200 Paranoid schizophrenia: Secondary | ICD-10-CM | POA: Diagnosis not present

## 2016-11-10 DIAGNOSIS — F39 Unspecified mood [affective] disorder: Secondary | ICD-10-CM

## 2016-11-10 DIAGNOSIS — R443 Hallucinations, unspecified: Secondary | ICD-10-CM | POA: Diagnosis not present

## 2016-11-10 DIAGNOSIS — F22 Delusional disorders: Secondary | ICD-10-CM

## 2016-11-10 DIAGNOSIS — F419 Anxiety disorder, unspecified: Secondary | ICD-10-CM | POA: Diagnosis not present

## 2016-11-10 LAB — BASIC METABOLIC PANEL
Anion gap: 8 (ref 5–15)
BUN: 25 mg/dL — ABNORMAL HIGH (ref 6–20)
CO2: 25 mmol/L (ref 22–32)
Calcium: 8.6 mg/dL — ABNORMAL LOW (ref 8.9–10.3)
Chloride: 113 mmol/L — ABNORMAL HIGH (ref 101–111)
Creatinine, Ser: 0.72 mg/dL (ref 0.44–1.00)
GFR calc Af Amer: >60 mL/min (ref >60)
GFR calc non Af Amer: >60 mL/min (ref >60)
Glucose, Bld: 142 mg/dL — ABNORMAL HIGH (ref 65–99)
Potassium: 3.3 mmol/L — ABNORMAL LOW (ref 3.5–5.1)
Sodium: 146 mmol/L — ABNORMAL HIGH (ref 135–145)

## 2016-11-10 MED ORDER — LORAZEPAM 1 MG PO TABS
1.0000 mg | ORAL_TABLET | Freq: Three times a day (TID) | ORAL | Status: DC
  Administered 2016-11-10 – 2016-11-11 (×2): 1 mg via ORAL
  Filled 2016-11-10 (×2): qty 1

## 2016-11-10 MED ORDER — POTASSIUM CHLORIDE 2 MEQ/ML IV SOLN
Freq: Once | INTRAVENOUS | Status: AC
  Administered 2016-11-10: 06:00:00 via INTRAVENOUS
  Filled 2016-11-10: qty 1000

## 2016-11-10 MED ORDER — LORAZEPAM 2 MG/ML IJ SOLN
1.0000 mg | Freq: Three times a day (TID) | INTRAMUSCULAR | Status: DC
  Administered 2016-11-10: 1 mg via INTRAVENOUS

## 2016-11-10 MED ORDER — ZIPRASIDONE MESYLATE 20 MG IM SOLR: 20.0000 mg | Freq: Once | INTRAMUSCULAR | Status: DC | PRN

## 2016-11-10 MED ORDER — LORAZEPAM 1 MG PO TABS
1.0000 mg | ORAL_TABLET | Freq: Three times a day (TID) | ORAL | Status: DC
Start: 2016-11-10 — End: 2016-11-10

## 2016-11-10 MED ORDER — SODIUM CHLORIDE 0.9 % IV BOLUS (SEPSIS)
1000.0000 mL | Freq: Once | INTRAVENOUS | Status: AC
  Administered 2016-11-10: 1000 mL via INTRAVENOUS

## 2016-11-10 MED ORDER — LORAZEPAM 2 MG/ML IJ SOLN
1.0000 mg | Freq: Three times a day (TID) | INTRAMUSCULAR | Status: DC
  Filled 2016-11-10: qty 1

## 2016-11-10 MED ORDER — LORAZEPAM 2 MG/ML IJ SOLN: 1.0000 mg | Freq: Three times a day (TID) | INTRAMUSCULAR | Status: DC

## 2016-11-10 NOTE — ED Notes (Signed)
Patient has refused VSs, bathroom assist, food and drink after multiple attempts. Patient did allow phlebotomy tech to draw labs due to husband being in the room. Husband visited for 30 minutes.

## 2016-11-10 NOTE — ED Notes (Signed)
Patient continues to sit on edge of bed.  She has been in that position for the whole shift.

## 2016-11-10 NOTE — ED Notes (Signed)
Dr. Juleen ChinaKohut notified of unsuccessful blood draw attempts.  Verbal order for 1 liter of NS given.

## 2016-11-10 NOTE — ED Notes (Addendum)
Pt refused her breakfast and also refuses to drink anything. Sitting at the edge of the bed staring at the wall.

## 2016-11-10 NOTE — ED Provider Notes (Signed)
Pt is refusing her meds. She also is refusing K and pt appears dehydrated. Pt is IVC. I had a long discussion with Ms. Amy Casey about the need for her to take her meds to get better. Ms. Amy Casey is demonstrating poor judgement. She wants to get better, but she is refusing meds prescribed.  I have ordered prn geodon if needed, but the facility wants her K to be better, pt is clearly dehydrated- and I think it will be best to give patient iv K and fluids.    Derwood KaplanNanavati, Marlicia Sroka, MD 11/10/16 [phone]

## 2016-11-10 NOTE — ED Notes (Signed)
Unsuccessful blood draw attempt. 

## 2016-11-10 NOTE — BH Assessment (Signed)
[facility] Assessment Progress Note  Per Thedore MinsMojeed Akintayo, MD, this pt continues to require psychiatric hospitalization.  She remains under IVC.  Thayer OhmChris calls from [facility] (phone: [phone]; fax: [phone]) to report that their physician has agreed to accept pt in principle, but that pt needs to be tested with improved potassium level first.  He adds that they are holding a bed for the pt.  As of this writing results of a repeat BMET are pending.  When they are posted, please fax results to [facility], and follow up with a call to the numbers listed above to finalize pt's disposition.  Doylene Canninghomas Dasani Thurlow, MA Triage Specialist [phone]

## 2016-11-10 NOTE — ED Notes (Signed)
Attempted blood draw x 2.  No success.  Phlebotomy notified for assistance.

## 2016-11-10 NOTE — ED Notes (Signed)
Pt keeps stating that she has to go to the bathroom. When the Rn and I go to help her she refuses to get up and claims the IV pole is 'in her way' and she 'will not make it in time'. Rn saline locked her and then she still refused to get up to go to the bathroom. Pt also refused all of her meds from Rn. Pt doesn't make clear statements, just mumbles short phrases. Pt continuing to stare at the wall with a blank stare while rocking back and forth at the edge of the bed. Research scientist (physical sciences)Trained sitter at bedside.

## 2016-11-10 NOTE — ED Notes (Signed)
Patient still refusing all medications.  Dr. Jannifer FranklinAkintayo made aware during rounding.  Patient reported she needed to use the bathroom.  She did not want to go into bathroom with her IV pole.  Nurse disconnected infusion to allow her to use bathroom, but she still did not go.  Reattached and restarted IV.

## 2016-11-10 NOTE — ED Notes (Signed)
Lab called to draw blood

## 2016-11-10 NOTE — ED Notes (Signed)
Attempted without success to [facility], [phone] as requested by Judeth CornfieldJean, LCSW @ [facility].

## 2016-11-10 NOTE — Progress Notes (Signed)
CSW faxed new labs to [name].  CSW contacted [facility] Nurse, Consuella LoseElaine, and requested that IVC paperwork be faxed directly to hospital as it was not coming through [facility].    Timmothy EulerJean T. Kaylyn LimSutter, MSW, LCSWA Disposition Clinical Social Work [phone] (cell) [phone] (office)

## 2016-11-10 NOTE — Consult Note (Signed)
[facility] Consult   Reason for Consult:  Acute paranaoia Referring Physician: EDP Patient Identification: Amy Casey MRN:  [id number] Principal Diagnosis: Paranoid schizophrenia [facility]) Diagnosis:   Patient Active Problem List   Diagnosis Date Noted  . High risk medication use [Z79.899] 07/25/2016  . Involuntary commitment [Z04.6]   . Paranoid schizophrenia ([place]) [F20.0] 06/16/2015  . Noncompliance [Z91.19] 06/16/2015  . Altered awareness, transient [R40.4]   . Altered mental status [R41.82] 02/19/2014  . Essential hypertension, benign [I10] 02/10/2012  . Tooth decay [K02.9] 02/10/2012  . Obesity [E66.9] 02/10/2012  . Impaired fasting blood sugar [R73.01] 02/10/2012    Total Time spent with patient: 30 minutes  Subjective:   Amy Casey is a 56 y.o. female patient admitted with acute paranoia.  HPI:  Pt was seen by Dr Darleene Cleaver and this clinician. Pt remains paranoid but calm. Pt is refusing all medications, food and liquids. Pt has a history of paranoid schizophrenia and is very guarded and non-communicative thought blocking. Pt is almost catatonic in nature and suspicious of everything. Pt has been calm while in the emergency room. Pt would benefit from inpatient psychiatric admission for crisis stabilization and medication management.   Past Psychiatric History: As above  Risk to Self: Suicidal Ideation: No Suicidal Intent: No Is patient at risk for suicide?: No Suicidal Plan?: No Access to Means: No What has been your use of drugs/alcohol within the last 12 months?: None How many times?: 1 Other Self Harm Risks: None Triggers for Past Attempts: Unknown Intentional Self Injurious Behavior: None Risk to Others: Homicidal Ideation: No Thoughts of Harm to Others: No Current Homicidal Intent: No Current Homicidal Plan: No Access to Homicidal Means: No Identified Victim: No one History of harm to others?: No Assessment of Violence: None  Noted Violent Behavior Description: None reported Does patient have access to weapons?: No Criminal Charges Pending?: No Does patient have a court date: No Prior Inpatient Therapy: Prior Inpatient Therapy: Yes Prior Therapy Dates: 2016 Prior Therapy Facilty/Provider(s): [facility] Reason for Treatment: psychosis Prior Outpatient Therapy: Prior Outpatient Therapy: Yes Prior Therapy Dates: Two years or more ago. Prior Therapy Facilty/Provider(s): [facility] at [facility] Reason for Treatment: med management Does patient have an ACCT team?: No Does patient have Intensive In-House Services?  : No Does patient have [facility] services? : No Does patient have P4CC services?: No  Past Medical History:  Past Medical History:  Diagnosis Date  . Depression    Dr. Silvio Pate, [place]; hospitalization 07/2010  . History of echocardiogram 2011   [facility]  . History of renal stone   . Hypertension   . Hypokalemia   . Obesity   . Schizophrenia ([place])   . Wears glasses     Past Surgical History:  Procedure Laterality Date  . WISDOM TOOTH EXTRACTION     Family History:  Family History  Problem Relation Age of Onset  . Diabetes Mother   . Kidney disease Mother        dialysis  . Heart disease Mother   . Cancer Maternal Uncle        lung  . Stroke Neg Hx   . Hypertension Neg Hx   . Hyperlipidemia Neg Hx    Family Psychiatric  History: Unknown Social History:  History  Alcohol Use No     History  Drug Use No    Social History   Social History  . Marital status: Married    Spouse name: N/A  . Number of children:  N/A  . Years of education: N/A   Social History Main Topics  . Smoking status: Never Smoker  . Smokeless tobacco: Never Used  . Alcohol use No  . Drug use: No  . Sexual activity: Not Asked   Other Topics Concern  . None   Social History Narrative   Married, 2 children, mother in law lives with them, was working as a Horticulturist, commercial Social History:    Allergies:    Allergies  Allergen Reactions  . Paliperidone     Angioedema, drooling, slurred speech, ptosis  . Lisinopril Rash    Angioedema     Labs:  Results for orders placed or performed during the hospital encounter of 11/08/16 (from the past 48 hour(s))  Comprehensive metabolic panel     Status: Abnormal   Collection Time: 11/08/16  5:18 PM  Result Value Ref Range   Sodium 144 135 - 145 mmol/L   Potassium 3.0 (L) 3.5 - 5.1 mmol/L   Chloride 102 101 - 111 mmol/L   CO2 25 22 - 32 mmol/L   Glucose, Bld 94 65 - 99 mg/dL   BUN 38 (H) 6 - 20 mg/dL   Creatinine, Ser 1.10 (H) 0.44 - 1.00 mg/dL   Calcium 9.6 8.9 - 10.3 mg/dL   Total Protein 8.6 (H) 6.5 - 8.1 g/dL   Albumin 4.5 3.5 - 5.0 g/dL   AST 22 15 - 41 U/L   ALT 20 14 - 54 U/L   Alkaline Phosphatase 76 38 - 126 U/L   Total Bilirubin 0.9 0.3 - 1.2 mg/dL   GFR calc non Af Amer 55 (L) >60 mL/min   GFR calc Af Amer >60 >60 mL/min    Comment: (NOTE) The eGFR has been calculated using the CKD EPI equation. This calculation has not been validated in all clinical situations. eGFR's persistently <60 mL/min signify possible Chronic Kidney Disease.    Anion gap 17 (H) 5 - 15  Ethanol     Status: None   Collection Time: 11/08/16  5:18 PM  Result Value Ref Range   Alcohol, Ethyl (B) <5 <5 mg/dL    Comment:        LOWEST DETECTABLE LIMIT FOR SERUM ALCOHOL IS 5 mg/dL FOR MEDICAL PURPOSES ONLY   Salicylate level     Status: None   Collection Time: 11/08/16  5:18 PM  Result Value Ref Range   Salicylate Lvl <0.1 2.8 - 30.0 mg/dL  Acetaminophen level     Status: Abnormal   Collection Time: 11/08/16  5:18 PM  Result Value Ref Range   Acetaminophen (Tylenol), Serum <10 (L) 10 - 30 ug/mL    Comment:        THERAPEUTIC CONCENTRATIONS VARY SIGNIFICANTLY. A RANGE OF 10-30 ug/mL MAY BE AN EFFECTIVE CONCENTRATION FOR MANY PATIENTS. HOWEVER, SOME ARE BEST TREATED AT CONCENTRATIONS OUTSIDE THIS RANGE. ACETAMINOPHEN CONCENTRATIONS >150 ug/mL  AT 4 HOURS AFTER INGESTION AND >50 ug/mL AT 12 HOURS AFTER INGESTION ARE OFTEN ASSOCIATED WITH TOXIC REACTIONS.   cbc     Status: Abnormal   Collection Time: 11/08/16  5:18 PM  Result Value Ref Range   WBC 9.1 4.0 - 10.5 K/uL   RBC 5.05 3.87 - 5.11 MIL/uL   Hemoglobin 14.6 12.0 - 15.0 g/dL   HCT 42.9 36.0 - 46.0 %   MCV 85.0 78.0 - 100.0 fL   MCH 28.9 26.0 - 34.0 pg   MCHC 34.0 30.0 - 36.0 g/dL   RDW 12.6 11.5 -  15.5 %   Platelets 430 (H) 150 - 400 K/uL  I-Stat beta hCG blood, ED     Status: None   Collection Time: 11/08/16  5:35 PM  Result Value Ref Range   I-stat hCG, quantitative <5.0 <5 mIU/mL   Comment 3            Comment:   GEST. AGE      CONC.  (mIU/mL)   <=1 WEEK        5 - 50     2 WEEKS       50 - 500     3 WEEKS       100 - 10,000     4 WEEKS     1,000 - 30,000        FEMALE AND NON-PREGNANT FEMALE:     LESS THAN 5 mIU/mL   Rapid urine drug screen (hospital performed)     Status: None   Collection Time: 11/09/16  1:43 PM  Result Value Ref Range   Opiates NONE DETECTED NONE DETECTED   Cocaine NONE DETECTED NONE DETECTED   Benzodiazepines NONE DETECTED NONE DETECTED   Amphetamines NONE DETECTED NONE DETECTED   Tetrahydrocannabinol NONE DETECTED NONE DETECTED   Barbiturates NONE DETECTED NONE DETECTED    Comment:        DRUG SCREEN FOR MEDICAL PURPOSES ONLY.  IF CONFIRMATION IS NEEDED FOR ANY PURPOSE, NOTIFY LAB WITHIN 5 DAYS.        LOWEST DETECTABLE LIMITS FOR URINE DRUG SCREEN Drug Class       Cutoff (ng/mL) Amphetamine      1000 Barbiturate      200 Benzodiazepine   503 Tricyclics       546 Opiates          300 Cocaine          300 THC              50   Urinalysis, Routine w reflex microscopic     Status: Abnormal   Collection Time: 11/09/16  1:43 PM  Result Value Ref Range   Color, Urine YELLOW YELLOW   APPearance CLEAR CLEAR   Specific Gravity, Urine 1.017 1.005 - 1.030   pH 6.0 5.0 - 8.0   Glucose, UA NEGATIVE NEGATIVE mg/dL   Hgb urine  dipstick SMALL (A) NEGATIVE   Bilirubin Urine NEGATIVE NEGATIVE   Ketones, ur 80 (A) NEGATIVE mg/dL   Protein, ur NEGATIVE NEGATIVE mg/dL   Nitrite NEGATIVE NEGATIVE   Leukocytes, UA NEGATIVE NEGATIVE   RBC / HPF 0-5 0 - 5 RBC/hpf   WBC, UA 0-5 0 - 5 WBC/hpf   Bacteria, UA RARE (A) NONE SEEN   Squamous Epithelial / LPF 0-5 (A) NONE SEEN   Mucous PRESENT   Basic metabolic panel     Status: Abnormal   Collection Time: 11/09/16  9:10 PM  Result Value Ref Range   Sodium 147 (H) 135 - 145 mmol/L   Potassium 3.1 (L) 3.5 - 5.1 mmol/L   Chloride 109 101 - 111 mmol/L   CO2 23 22 - 32 mmol/L   Glucose, Bld 76 65 - 99 mg/dL   BUN 27 (H) 6 - 20 mg/dL   Creatinine, Ser 0.83 0.44 - 1.00 mg/dL   Calcium 9.5 8.9 - 10.3 mg/dL   GFR calc non Af Amer >60 >60 mL/min   GFR calc Af Amer >60 >60 mL/min    Comment: (NOTE) The eGFR has been calculated using the  CKD EPI equation. This calculation has not been validated in all clinical situations. eGFR's persistently <60 mL/min signify possible Chronic Kidney Disease.    Anion gap 15 5 - 15    Current Facility-Administered Medications  Medication Dose Route Frequency Provider Last Rate Last Dose  . amLODipine (NORVASC) tablet 10 mg  10 mg Oral Daily Lacretia Leigh, MD      . aspirin EC tablet 81 mg  81 mg Oral Daily Lacretia Leigh, MD      . benztropine (COGENTIN) tablet 1 mg  1 mg Oral Daily Lacretia Leigh, MD      . FLUoxetine (PROZAC) capsule 40 mg  40 mg Oral Daily Lacretia Leigh, MD      . OLANZapine zydis (ZYPREXA) disintegrating tablet 5 mg  5 mg Oral BID Margaret Staggs, MD      . potassium chloride (K-DUR) CR tablet 10 mEq  10 mEq Oral Daily Lacretia Leigh, MD      . potassium chloride SA (K-DUR,KLOR-CON) CR tablet 40 mEq  40 mEq Oral Once Lacretia Leigh, MD      . ziprasidone (GEODON) injection 20 mg  20 mg Intramuscular Once PRN Varney Biles, MD       Current Outpatient Prescriptions  Medication Sig Dispense Refill  . amLODipine  (NORVASC) 10 MG tablet Take 1 tablet (10 mg total) by mouth daily. 90 tablet 1  . aspirin EC 81 MG tablet Take 1 tablet (81 mg total) by mouth daily. 90 tablet 3  . benztropine (COGENTIN) 1 MG tablet Take 1 tablet (1 mg total) by mouth daily. 90 tablet 0  . FLUoxetine (PROZAC) 40 MG capsule Take 1 capsule (40 mg total) by mouth daily. 90 capsule 0  . potassium chloride (K-DUR) 10 MEQ tablet Take 1 tablet (10 mEq total) by mouth daily. 90 tablet 1    Musculoskeletal: Strength & Muscle Tone: within normal limits Gait & Station: normal Patient leans: N/A  Psychiatric Specialty Exam: Physical Exam  Constitutional: She appears well-developed.  Respiratory: Effort normal.  Musculoskeletal: Normal range of motion.  Neurological: She is alert.  Psychiatric: Her affect is blunt. She is slowed. Thought content is paranoid. She is noncommunicative.    Review of Systems  Psychiatric/Behavioral: Positive for depression and hallucinations (paranoid schizophrenia). Negative for memory loss, substance abuse and suicidal ideas. The patient is nervous/anxious. The patient does not have insomnia.   All other systems reviewed and are negative.   Blood pressure (!) 145/85, pulse 80, temperature 98 F (36.7 C), temperature source Oral, resp. rate 20, SpO2 100 %.There is no height or weight on file to calculate BMI.  General Appearance: Disheveled  Eye Contact:  Fair  Speech:  Blocked  Volume:  noncommunicative  Mood:  Anxious  Affect:  Congruent  Thought Process:  Disorganized  Orientation:  Other:  person  Thought Content:  Paranoid Ideation  Suicidal Thoughts:  UTA noncommunicative  Homicidal Thoughts:  UTA noncommunicative  Memory:  UTA noncommunicative  Judgement:  Other:  UTA noncommunicative  Insight:  UTA noncommunicative  Psychomotor Activity:  Decreased  Concentration:  Concentration: UTA noncommunicative and Attention Span: UTA noncommunicative  Recall:  UTA noncommunicative  Fund of  Knowledge:  UTA noncommunicative  Language:  UTA noncommunicative  Akathisia:  No  Handed:  Right  AIMS (if indicated):     Assets:  Financial Resources/Insurance Housing Resilience Social Support  ADL's:  Intact  Cognition:  WNL  Sleep:        Treatment Plan Summary: Daily contact  with patient to assess and evaluate symptoms and progress in treatment and Medication management  -Continue Prozac 40 mg QD for depression -Continue Cogentin 1 mg QD for mood stabilization -Add Ativan 1 mg IM Q8H for acute psychosis  Disposition: Recommend psychiatric Inpatient admission when medically cleared.TTS to seek appropriate placement  Ethelene Hal, NP 11/10/2016 12:10 PM  Patient seen face-to-face for psychiatric evaluation, chart reviewed and case discussed with the physician extender and developed treatment plan. Reviewed the information documented and agree with the treatment plan. Corena Pilgrim, MD

## 2016-11-10 NOTE — Progress Notes (Addendum)
[facility], Aram Beechamynthia.  Patient's potassium levels are trending up and patient was doing better after having Ativan prescribed.  CSW contacted Berdine Dancehris, [facility] at [facility] and related up information.  Thayer OhmChris indicated that as long as the pt's potassium level is trending up, he could take her.  [name], Bed 150-1 Dr. Robin SearingVennkta Chivukula accepting. Nurse to Nurse report # [phone] Pt. is IVC'd and will be transported by Patent examinerlaw enforcement. Pt. may arrive after 6 AM on 11/11/16.  CSW will fax additional clinical information and [facility].  CSW notified [facility] Nurse of acceptance.  Timmothy EulerJean T. Kaylyn LimSutter, MSW, LCSWA Disposition Clinical Social Work [phone] (cell) [phone] (office)

## 2016-11-10 NOTE — ED Provider Notes (Signed)
Still refusing meds. Able to get labs drawn when husband was visiting. Facility requesting potassium in normal range prior to accepting. No getting IVF with K. Will repeat labs this afternoon. Pt sitting on the edge of the bed staring blankly at wall. Will make eye contact when I talk to her but not verbally responding. She doesn't seemed distressed. Needs repeat vitals this am.   Vitals:   11/09/16 1730 11/10/16 0558  BP: 124/78 (!) 149/83  Pulse: 74 81  Resp: 18 20  Temp:  97.9 F (36.6 C)  SpO2: 100% 100%      Amy Casey, Amy Selway, MD 11/10/16 [phone]

## 2016-11-10 NOTE — ED Notes (Signed)
Pt actually resting now after receiving fluids and other IV medications. Pt much more lively and is actually talking and eating some of her lunch from earlier. Pt requested to take a shower and wash up later today.

## 2016-11-11 DIAGNOSIS — I10 Essential (primary) hypertension: Secondary | ICD-10-CM | POA: Diagnosis not present

## 2016-11-11 DIAGNOSIS — F29 Unspecified psychosis not due to a substance or known physiological condition: Secondary | ICD-10-CM | POA: Diagnosis not present

## 2016-11-11 DIAGNOSIS — G4700 Insomnia, unspecified: Secondary | ICD-10-CM | POA: Diagnosis not present

## 2016-11-11 DIAGNOSIS — R4182 Altered mental status, unspecified: Secondary | ICD-10-CM | POA: Diagnosis not present

## 2016-11-11 DIAGNOSIS — R062 Wheezing: Secondary | ICD-10-CM | POA: Diagnosis not present

## 2016-11-11 DIAGNOSIS — K5900 Constipation, unspecified: Secondary | ICD-10-CM | POA: Diagnosis not present

## 2016-11-11 DIAGNOSIS — R630 Anorexia: Secondary | ICD-10-CM | POA: Diagnosis not present

## 2016-11-11 DIAGNOSIS — J45909 Unspecified asthma, uncomplicated: Secondary | ICD-10-CM | POA: Diagnosis not present

## 2016-11-11 DIAGNOSIS — L03119 Cellulitis of unspecified part of limb: Secondary | ICD-10-CM | POA: Diagnosis not present

## 2016-11-11 DIAGNOSIS — F509 Eating disorder, unspecified: Secondary | ICD-10-CM | POA: Diagnosis not present

## 2016-11-11 DIAGNOSIS — F329 Major depressive disorder, single episode, unspecified: Secondary | ICD-10-CM | POA: Diagnosis not present

## 2016-11-11 DIAGNOSIS — E876 Hypokalemia: Secondary | ICD-10-CM | POA: Diagnosis not present

## 2016-11-11 DIAGNOSIS — L03114 Cellulitis of left upper limb: Secondary | ICD-10-CM | POA: Diagnosis not present

## 2016-11-11 DIAGNOSIS — F201 Disorganized schizophrenia: Secondary | ICD-10-CM | POA: Diagnosis not present

## 2016-11-11 DIAGNOSIS — I739 Peripheral vascular disease, unspecified: Secondary | ICD-10-CM | POA: Diagnosis not present

## 2016-11-11 DIAGNOSIS — F200 Paranoid schizophrenia: Secondary | ICD-10-CM | POA: Diagnosis not present

## 2016-11-11 DIAGNOSIS — N179 Acute kidney failure, unspecified: Secondary | ICD-10-CM | POA: Diagnosis not present

## 2016-11-11 DIAGNOSIS — R410 Disorientation, unspecified: Secondary | ICD-10-CM | POA: Diagnosis not present

## 2016-11-11 DIAGNOSIS — Z882 Allergy status to sulfonamides status: Secondary | ICD-10-CM | POA: Diagnosis not present

## 2016-11-11 NOTE — ED Notes (Signed)
Call placed to [facility], [phone], for transport to [place]. Received recording to leave message.  Message left requesting Sheriff call prior to arrival so paperwork can be completed.  # left for call back.

## 2016-11-11 NOTE — ED Notes (Addendum)
Pt provided clean scrubs d/t being urine soaked.  Pt refused to remove wet panties but did change into new scrubs.  Pt stated "these clothes are wet and stink".  Pt is alert to place only stating "I'm at [facility]".  Pt stated "it's 4 or 6" when asked what day.  Pt c/o when drinking water stating "It hurts my tooth."  Pt presents with decay to left lower back tooth.  Pt stated "It only hurts once in a while."

## 2016-11-11 NOTE — ED Notes (Signed)
20 g IV catheter removed from LFA, catheter intact, site unremarkable, gauze dressing applied.

## 2016-11-11 NOTE — ED Notes (Signed)
Patient refused to sign discharge and emtala.

## 2016-11-12 DIAGNOSIS — R630 Anorexia: Secondary | ICD-10-CM | POA: Diagnosis not present

## 2016-11-12 DIAGNOSIS — F200 Paranoid schizophrenia: Secondary | ICD-10-CM | POA: Diagnosis not present

## 2016-11-12 DIAGNOSIS — J45909 Unspecified asthma, uncomplicated: Secondary | ICD-10-CM | POA: Diagnosis not present

## 2016-11-12 DIAGNOSIS — R062 Wheezing: Secondary | ICD-10-CM | POA: Diagnosis not present

## 2016-11-13 DIAGNOSIS — F200 Paranoid schizophrenia: Secondary | ICD-10-CM | POA: Diagnosis not present

## 2016-11-14 DIAGNOSIS — J45909 Unspecified asthma, uncomplicated: Secondary | ICD-10-CM | POA: Diagnosis not present

## 2016-11-14 DIAGNOSIS — F200 Paranoid schizophrenia: Secondary | ICD-10-CM | POA: Diagnosis not present

## 2016-11-14 DIAGNOSIS — R630 Anorexia: Secondary | ICD-10-CM | POA: Diagnosis not present

## 2016-11-14 DIAGNOSIS — I10 Essential (primary) hypertension: Secondary | ICD-10-CM | POA: Diagnosis not present

## 2016-11-14 DIAGNOSIS — R062 Wheezing: Secondary | ICD-10-CM | POA: Diagnosis not present

## 2016-11-15 DIAGNOSIS — I10 Essential (primary) hypertension: Secondary | ICD-10-CM | POA: Diagnosis not present

## 2016-11-15 DIAGNOSIS — F200 Paranoid schizophrenia: Secondary | ICD-10-CM | POA: Diagnosis not present

## 2016-11-15 DIAGNOSIS — R4182 Altered mental status, unspecified: Secondary | ICD-10-CM | POA: Diagnosis not present

## 2016-11-15 DIAGNOSIS — N179 Acute kidney failure, unspecified: Secondary | ICD-10-CM | POA: Diagnosis not present

## 2016-11-16 DIAGNOSIS — I10 Essential (primary) hypertension: Secondary | ICD-10-CM | POA: Diagnosis not present

## 2016-11-16 DIAGNOSIS — R4182 Altered mental status, unspecified: Secondary | ICD-10-CM | POA: Diagnosis not present

## 2016-11-16 DIAGNOSIS — F200 Paranoid schizophrenia: Secondary | ICD-10-CM | POA: Diagnosis not present

## 2016-11-16 DIAGNOSIS — N179 Acute kidney failure, unspecified: Secondary | ICD-10-CM | POA: Diagnosis not present

## 2016-11-17 DIAGNOSIS — F200 Paranoid schizophrenia: Secondary | ICD-10-CM | POA: Diagnosis not present

## 2016-11-17 DIAGNOSIS — I10 Essential (primary) hypertension: Secondary | ICD-10-CM | POA: Diagnosis not present

## 2016-11-17 DIAGNOSIS — N179 Acute kidney failure, unspecified: Secondary | ICD-10-CM | POA: Diagnosis not present

## 2016-11-18 DIAGNOSIS — I10 Essential (primary) hypertension: Secondary | ICD-10-CM | POA: Diagnosis not present

## 2016-11-18 DIAGNOSIS — R410 Disorientation, unspecified: Secondary | ICD-10-CM | POA: Diagnosis not present

## 2016-11-18 DIAGNOSIS — N179 Acute kidney failure, unspecified: Secondary | ICD-10-CM | POA: Diagnosis not present

## 2016-11-19 DIAGNOSIS — I10 Essential (primary) hypertension: Secondary | ICD-10-CM | POA: Diagnosis not present

## 2016-11-19 DIAGNOSIS — F201 Disorganized schizophrenia: Secondary | ICD-10-CM | POA: Diagnosis not present

## 2016-11-19 DIAGNOSIS — R4182 Altered mental status, unspecified: Secondary | ICD-10-CM | POA: Diagnosis not present

## 2016-11-19 DIAGNOSIS — N179 Acute kidney failure, unspecified: Secondary | ICD-10-CM | POA: Diagnosis not present

## 2016-11-20 DIAGNOSIS — F201 Disorganized schizophrenia: Secondary | ICD-10-CM | POA: Diagnosis not present

## 2016-11-21 DIAGNOSIS — L03119 Cellulitis of unspecified part of limb: Secondary | ICD-10-CM | POA: Diagnosis not present

## 2016-11-21 DIAGNOSIS — I10 Essential (primary) hypertension: Secondary | ICD-10-CM | POA: Diagnosis not present

## 2016-11-21 DIAGNOSIS — F200 Paranoid schizophrenia: Secondary | ICD-10-CM | POA: Diagnosis not present

## 2016-11-21 DIAGNOSIS — N179 Acute kidney failure, unspecified: Secondary | ICD-10-CM | POA: Diagnosis not present

## 2016-11-22 DIAGNOSIS — L03114 Cellulitis of left upper limb: Secondary | ICD-10-CM | POA: Diagnosis not present

## 2016-11-22 DIAGNOSIS — F200 Paranoid schizophrenia: Secondary | ICD-10-CM | POA: Diagnosis not present

## 2016-11-22 DIAGNOSIS — I10 Essential (primary) hypertension: Secondary | ICD-10-CM | POA: Diagnosis not present

## 2016-11-22 DIAGNOSIS — N179 Acute kidney failure, unspecified: Secondary | ICD-10-CM | POA: Diagnosis not present

## 2016-11-23 DIAGNOSIS — F200 Paranoid schizophrenia: Secondary | ICD-10-CM | POA: Diagnosis not present

## 2016-11-23 DIAGNOSIS — I10 Essential (primary) hypertension: Secondary | ICD-10-CM | POA: Diagnosis not present

## 2016-11-29 DIAGNOSIS — F209 Schizophrenia, unspecified: Secondary | ICD-10-CM | POA: Diagnosis not present

## 2016-12-01 ENCOUNTER — Emergency Department (HOSPITAL_BASED_OUTPATIENT_CLINIC_OR_DEPARTMENT_OTHER)
Admit: 2016-12-01 | Discharge: 2016-12-01 | Disposition: A | Discharge status: Home / Self Care | Payer: BLUE CROSS/BLUE SHIELD

## 2016-12-01 ENCOUNTER — Encounter (HOSPITAL_COMMUNITY): Payer: Self-pay | Admitting: *Deleted

## 2016-12-01 ENCOUNTER — Emergency Department (HOSPITAL_COMMUNITY): Payer: BLUE CROSS/BLUE SHIELD

## 2016-12-01 ENCOUNTER — Emergency Department (HOSPITAL_COMMUNITY)
Admission: EM | Admit: 2016-12-01 | Discharge: 2016-12-01 | Disposition: A | Discharge status: Home / Self Care | Payer: BLUE CROSS/BLUE SHIELD | Attending: Emergency Medicine | Admitting: Emergency Medicine

## 2016-12-01 DIAGNOSIS — Z7982 Long term (current) use of aspirin: Secondary | ICD-10-CM | POA: Insufficient documentation

## 2016-12-01 DIAGNOSIS — I10 Essential (primary) hypertension: Secondary | ICD-10-CM | POA: Diagnosis not present

## 2016-12-01 DIAGNOSIS — S82892A Other fracture of left lower leg, initial encounter for closed fracture: Secondary | ICD-10-CM | POA: Insufficient documentation

## 2016-12-01 DIAGNOSIS — M7989 Other specified soft tissue disorders: Secondary | ICD-10-CM

## 2016-12-01 DIAGNOSIS — Y999 Unspecified external cause status: Secondary | ICD-10-CM | POA: Insufficient documentation

## 2016-12-01 DIAGNOSIS — Z79899 Other long term (current) drug therapy: Secondary | ICD-10-CM | POA: Insufficient documentation

## 2016-12-01 DIAGNOSIS — W19XXXA Unspecified fall, initial encounter: Secondary | ICD-10-CM | POA: Insufficient documentation

## 2016-12-01 DIAGNOSIS — S8265XA Nondisplaced fracture of lateral malleolus of left fibula, initial encounter for closed fracture: Secondary | ICD-10-CM | POA: Diagnosis not present

## 2016-12-01 DIAGNOSIS — S99912A Unspecified injury of left ankle, initial encounter: Secondary | ICD-10-CM | POA: Diagnosis not present

## 2016-12-01 DIAGNOSIS — Y939 Activity, unspecified: Secondary | ICD-10-CM | POA: Insufficient documentation

## 2016-12-01 DIAGNOSIS — Y929 Unspecified place or not applicable: Secondary | ICD-10-CM | POA: Insufficient documentation

## 2016-12-01 NOTE — ED Provider Notes (Signed)
[facility]-EMERGENCY DEPT Provider Note   CSN: [id number] Arrival date & time: 12/01/16  1151     History   Chief Complaint Chief Complaint  Patient presents with  . Foot Pain    HPI Amy Casey is a 56 y.o. female.  HPI   56 year old female presents today with complaints of left ankle pain. Patient notes that on 11/08/2016 she fell off her tach. She notes minor medial ankle pain. She notes she was able to ambulate without significant difficulty. She notes that she was hospitalized or psych gastric illness, and notes that symptoms began to worsen after hospitalization. She notes after getting out she had worsening swelling in the left lower extremity and pain. Patient notes she works in [employer] and is standing for prolonged periods of time. She notes that over-the-counter medications including Tylenol and ibuprofen have been improving her symptoms. Patient reports minor swelling in the calf as well. No other injuries or complaints today.    Past Medical History:  Diagnosis Date  . Depression    Dr. Mila HomerSena, [place]; hospitalization 07/2010  . History of echocardiogram 2011   [facility]  . History of renal stone   . Hypertension   . Hypokalemia   . Obesity   . Schizophrenia ([facility])   . Wears glasses     Patient Active Problem List   Diagnosis Date Noted  . High risk medication use 07/25/2016  . Involuntary commitment   . Paranoid schizophrenia ([facility]) 06/16/2015  . Noncompliance 06/16/2015  . Altered awareness, transient   . Altered mental status 02/19/2014  . Essential hypertension, benign 02/10/2012  . Tooth decay 02/10/2012  . Obesity 02/10/2012  . Impaired fasting blood sugar 02/10/2012    Past Surgical History:  Procedure Laterality Date  . WISDOM TOOTH EXTRACTION      OB History    No data available       Home Medications    Prior to Admission medications   Medication Sig Start Date End Date Taking? Authorizing Provider  amLODipine (NORVASC) 10 MG  tablet Take 1 tablet (10 mg total) by mouth daily. 07/25/16   Tysinger, Kermit Baloavid S, PA-C  aspirin EC 81 MG tablet Take 1 tablet (81 mg total) by mouth daily. 07/25/16   Tysinger, Kermit Baloavid S, PA-C  benztropine (COGENTIN) 1 MG tablet Take 1 tablet (1 mg total) by mouth daily. 07/25/16   Tysinger, Kermit Baloavid S, PA-C  FLUoxetine (PROZAC) 40 MG capsule Take 1 capsule (40 mg total) by mouth daily. 07/25/16   Tysinger, Kermit Baloavid S, PA-C  potassium chloride (K-DUR) 10 MEQ tablet Take 1 tablet (10 mEq total) by mouth daily. 07/25/16   Tysinger, Kermit Baloavid S, PA-C    Family History Family History  Problem Relation Age of Onset  . Diabetes Mother   . Kidney disease Mother        dialysis  . Heart disease Mother   . Cancer Maternal Uncle        lung  . Stroke Neg Hx   . Hypertension Neg Hx   . Hyperlipidemia Neg Hx     Social History Social History  Substance Use Topics  . Smoking status: Never Smoker  . Smokeless tobacco: Never Used  . Alcohol use No     Allergies   Paliperidone; Sulfa antibiotics; and Lisinopril   Review of Systems Review of Systems  All other systems reviewed and are negative.    Physical Exam Updated Vital Signs BP (!) 165/84 (BP Location: Right Arm)   Pulse 77  Temp 98.4 F (36.9 C) (Oral)   Resp 18   Ht  (1.778 m)   Wt 77.1 kg (170 lb)   SpO2 100%   BMI 24.39 kg/m   Physical Exam  Constitutional: She is oriented to person, place, and time. She appears well-developed and well-nourished.  HENT:  Head: Normocephalic and atraumatic.  Eyes: Pupils are equal, round, and reactive to light. Conjunctivae are normal. Right eye exhibits no discharge. Left eye exhibits no discharge. No scleral icterus.  Neck: Normal range of motion. No JVD present. No tracheal deviation present.  Pulmonary/Chest: Effort normal. No stridor.  Musculoskeletal:  Swelling to the left ankle worse on the medial aspect swelling extends up to the mid third of the tibia, no significant warmth to  touch, minor tenderness to palpation of the calf  Neurological: She is alert and oriented to person, place, and time. Coordination normal.  Psychiatric: She has a normal mood and affect. Her behavior is normal. Judgment and thought content normal.  Nursing note and vitals reviewed.    ED Treatments / Results  Labs (all labs ordered are listed, but only abnormal results are displayed) Labs Reviewed - No data to display  EKG  EKG Interpretation None       Radiology Dg Ankle Complete Left  Result Date: 12/01/2016 CLINICAL DATA:  Left ankle and foot pain and swelling since a twisting injury on steps 11/08/2016. Initial encounter. EXAM: LEFT ANKLE COMPLETE - 3+ VIEW COMPARISON:  None. FINDINGS: There is marked soft tissue swelling about the ankle. The patient has a subacute fracture of the posterior malleolus which is nondisplaced. There is callus formation about the fracture. The fracture appears nondisplaced. No other fracture is identified. 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 IMPRESSION: Subacute, nondisplaced posterior malleolus fracture. No other fracture is identified. Marked soft tissue swelling about the ankle. Midfoot osteoarthritis. Calcaneal spurring. Electronically Signed   By: Drusilla Kanner M.D.   On: 12/01/2016 12:47   Dg Foot Complete Left  Result Date: 12/01/2016 CLINICAL DATA:  Initial evaluation for acute left foot and ankle pain, twisted ankle. Swelling at dorsal aspect of foot an ankle. EXAM: LEFT FOOT - COMPLETE 3+ VIEW COMPARISON:  None. FINDINGS: No acute fracture dislocation. Prominent degenerative spurring present at the dorsal midfoot. Degenerative osteoarthritic changes seen about the first MTP joint as well. Osseous mineralization normal. Diffuse soft tissue swelling, most prominent at the dorsal aspect of the midfoot. Posterior plantar calcaneal enthesophytes. IMPRESSION: 1. No acute fracture or dislocation about the left foot. 2. Diffuse soft  tissue swelling about the foot, greatest at dorsal aspect. 3. Prominent degenerative spurring at the dorsal midfoot and about the first MTP joint. Electronically Signed   By: Rise Mu M.D.   On: 12/01/2016 12:49    Procedures Procedures (including critical care time)  Medications Ordered in ED Medications - No data to display   Initial Impression / Assessment and Plan / ED Course  I have reviewed the triage vital signs and the nursing notes.  Pertinent labs & imaging results that were available during my care of the patient were reviewed by me and considered in my medical decision making (see chart for details).      Final Clinical Impressions(s) / ED Diagnoses   Final diagnoses:  Closed fracture of malleolus of left ankle, initial encounter    Labs:   Imaging: Lower extremity venous, DG foot complete, DG ankle complete  Consults:  Therapeutics:  Discharge Meds:   Assessment/Plan: 56 year old  female presents today with fracture to her medial malleolus. This appears nondisplaced. Patient has been walking on this for several weeks, this does not appear to be unstable. Given patient's long ambulatory history, this will likely remain stable. She will be placed in a cam walker, given crutches, encouraged follow-up with orthopedics, elevate, ice. She will continue using home medications. DVT study shows no acute findings. Patient given strict return precautions, she verbalized understanding and agreement to today's plan had no further questions or concerns at time of discharge.      New Prescriptions Discharge Medication List as of 12/01/2016  6:14 PM       Rosalio Loud 12/01/16 2051    Lavera Guise, MD 12/02/16 [phone]

## 2016-12-01 NOTE — ED Notes (Signed)
Declined W/C at D/C and was escorted to lobby by RN. 

## 2016-12-01 NOTE — Discharge Instructions (Signed)
Please read attached information. If you experience any new or worsening signs or symptoms please return to the emergency room for evaluation. Please follow-up with your primary care provider or specialist as discussed.  °

## 2016-12-01 NOTE — Progress Notes (Signed)
*  PRELIMINARY RESULTS* Vascular Ultrasound Left lower extremity venous duplex has been completed.  Preliminary findings: No evidence of DVT or baker's cyst.    Farrel DemarkJill Eunice, RDMS, RVT  12/01/2016, 5:18 PM

## 2016-12-01 NOTE — ED Triage Notes (Signed)
Pt came in with complaints of pain to left ankle and foot since slipping and fell in August.  Pt has pain, swelling up to mid shin with some redness/warmth.

## 2016-12-05 ENCOUNTER — Ambulatory Visit (INDEPENDENT_AMBULATORY_CARE_PROVIDER_SITE_OTHER): Payer: BLUE CROSS/BLUE SHIELD | Admitting: Family

## 2016-12-05 ENCOUNTER — Encounter (INDEPENDENT_AMBULATORY_CARE_PROVIDER_SITE_OTHER): Payer: Self-pay | Admitting: Family

## 2016-12-05 DIAGNOSIS — M25572 Pain in left ankle and joints of left foot: Secondary | ICD-10-CM

## 2016-12-05 DIAGNOSIS — S82392A Other fracture of lower end of left tibia, initial encounter for closed fracture: Secondary | ICD-10-CM | POA: Diagnosis not present

## 2016-12-05 NOTE — Progress Notes (Signed)
Office Visit Note   Patient: Amy Casey           Date of Birth: Jan 15, 1961           MRN: [id number] Visit Date: 12/05/2016              Requested by: Jac Canavan, PA-C [address] [place], [place] [address] PCP: Jac Canavan, PA-C  Chief Complaint  Patient presents with  . Left Ankle - Fracture      HPI: The patient is a 56 year old woman who presents today him for left ankle pain she sustained a posterior malleolus fracture on the left on August 14. She had been full weightbearing in regular shoewear until about a week ago. On September 6 had an emergency room visit in the fracture was diagnosed. She is full weightbearing in a fracture boot today. Continues to have pain with ambulation however she does request return to work.  Assessment & Plan: Visit Diagnoses:  1. Closed fracture of posterior malleolus of left tibia, initial encounter   2. Pain of joint of left ankle and foot     Plan: The patient will continue her [employer] remain out of work. Discussed that if she is pain-free she may advance weightbearing into an ASO and return to work next week. Discussed the importance of elevation and nonweightbearing if having pain.  Follow-Up Instructions: Return in about 2 weeks (around 12/19/2016).   Left Ankle Exam  Swelling: moderate  Tenderness  The patient is experiencing tenderness in the ATF, medial malleolus and CF.   Range of Motion  The patient has normal left ankle ROM.   Tests  Anterior drawer: negative  Other  Erythema: absent Pulse: present      Patient is alert, oriented, no adenopathy, well-dressed, normal affect, normal respiratory effort.   Imaging: No results found. No images are attached to the encounter.  Labs: Lab Results  Component Value Date   HGBA1C 5.7 (H) 06/16/2015   HGBA1C 6.0 (H) 02/22/2014   LABURIC 5.7 05/07/2012   REPTSTATUS 02/27/2014 FINAL 02/21/2014   GRAMSTAIN  02/19/2014    CYTOSPIN SLIDE WBC  PRESENT, PREDOMINANTLY MONONUCLEAR NO ORGANISMS SEEN Performed at [facility] Performed at [facility]    GRAMSTAIN  02/19/2014    CYTOSPIN WBC PRESENT, PREDOMINANTLY MONONUCLEAR NO ORGANISMS SEEN    CULT  02/21/2014    NO GROWTH 5 DAYS Performed at [facility]     Orders:  No orders of the defined types were placed in this encounter.  No orders of the defined types were placed in this encounter.    Procedures: No procedures performed  Clinical Data: No additional findings.  ROS:  All other systems negative, except as noted in the HPI. Review of Systems  Constitutional: Negative for chills and fever.  Musculoskeletal: Positive for arthralgias, joint swelling and myalgias.  Skin: Negative for wound.    Objective: Vital Signs: There were no vitals taken for this visit.  Specialty Comments:  No specialty comments available.  PMFS History: Patient Active Problem List   Diagnosis Date Noted  . High risk medication use 07/25/2016  . Involuntary commitment   . Paranoid schizophrenia (HCC) 06/16/2015  . Noncompliance 06/16/2015  . Altered awareness, transient   . Altered mental status 02/19/2014  . Essential hypertension, benign 02/10/2012  . Tooth decay 02/10/2012  . Obesity 02/10/2012  . Impaired fasting blood sugar 02/10/2012   Past Medical History:  Diagnosis Date  . Depression  Dr. Mila HomerSena, [facility]; hospitalization 07/2010  . History of echocardiogram 2011   [facility]  . History of renal stone   . Hypertension   . Hypokalemia   . Obesity   . Schizophrenia (HCC)   . Wears glasses     Family History  Problem Relation Age of Onset  . Diabetes Mother   . Kidney disease Mother        dialysis  . Heart disease Mother   . Cancer Maternal Uncle        lung  . Stroke Neg Hx   . Hypertension Neg Hx   . Hyperlipidemia Neg Hx     Past Surgical History:  Procedure Laterality Date  . WISDOM TOOTH EXTRACTION     Social  History   Occupational History  . Not on file.   Social History Main Topics  . Smoking status: Never Smoker  . Smokeless tobacco: Never Used  . Alcohol use No  . Drug use: No  . Sexual activity: Not on file

## 2016-12-14 DIAGNOSIS — F209 Schizophrenia, unspecified: Secondary | ICD-10-CM | POA: Diagnosis not present

## 2016-12-19 ENCOUNTER — Ambulatory Visit (INDEPENDENT_AMBULATORY_CARE_PROVIDER_SITE_OTHER): Payer: BLUE CROSS/BLUE SHIELD | Admitting: Family

## 2016-12-26 DIAGNOSIS — F209 Schizophrenia, unspecified: Secondary | ICD-10-CM | POA: Diagnosis not present

## 2016-12-31 ENCOUNTER — Emergency Department
Admission: EM | Admit: 2016-12-31 | Discharge: 2016-12-31 | Disposition: A | Discharge status: Home / Self Care | Payer: Worker's Compensation | Attending: Emergency Medicine | Admitting: Emergency Medicine

## 2016-12-31 ENCOUNTER — Emergency Department: Payer: Worker's Compensation

## 2016-12-31 ENCOUNTER — Encounter: Payer: Self-pay | Admitting: Emergency Medicine

## 2016-12-31 DIAGNOSIS — Z79899 Other long term (current) drug therapy: Secondary | ICD-10-CM | POA: Insufficient documentation

## 2016-12-31 DIAGNOSIS — I10 Essential (primary) hypertension: Secondary | ICD-10-CM | POA: Diagnosis not present

## 2016-12-31 DIAGNOSIS — Z7982 Long term (current) use of aspirin: Secondary | ICD-10-CM | POA: Diagnosis not present

## 2016-12-31 DIAGNOSIS — Y939 Activity, unspecified: Secondary | ICD-10-CM | POA: Insufficient documentation

## 2016-12-31 DIAGNOSIS — W010XXA Fall on same level from slipping, tripping and stumbling without subsequent striking against object, initial encounter: Secondary | ICD-10-CM | POA: Diagnosis not present

## 2016-12-31 DIAGNOSIS — M25512 Pain in left shoulder: Secondary | ICD-10-CM | POA: Diagnosis not present

## 2016-12-31 DIAGNOSIS — S161XXA Strain of muscle, fascia and tendon at neck level, initial encounter: Secondary | ICD-10-CM | POA: Diagnosis not present

## 2016-12-31 DIAGNOSIS — S0993XA Unspecified injury of face, initial encounter: Secondary | ICD-10-CM | POA: Diagnosis present

## 2016-12-31 DIAGNOSIS — S0083XA Contusion of other part of head, initial encounter: Secondary | ICD-10-CM | POA: Insufficient documentation

## 2016-12-31 DIAGNOSIS — Y9289 Other specified places as the place of occurrence of the external cause: Secondary | ICD-10-CM | POA: Diagnosis not present

## 2016-12-31 DIAGNOSIS — Y990 Civilian activity done for income or pay: Secondary | ICD-10-CM | POA: Diagnosis not present

## 2016-12-31 MED ORDER — METHOCARBAMOL 500 MG PO TABS: 500.0000 mg | ORAL_TABLET | Freq: Four times a day (QID) | ORAL | 0 refills | Status: DC

## 2016-12-31 NOTE — ED Provider Notes (Signed)
[facility] Emergency Department Provider Note  ____________________________________________   First MD Initiated Contact with Patient 12/31/16 1657     (approximate)  I have reviewed the triage vital signs and the nursing notes.   HISTORY  Chief Complaint Fall    HPI Amy Casey is a 56 y.o. female complaining of a fall while in the bathroom at work. States the floor was wet and she slipped. Hit the area by the door. Landed on the left shoulder and left mandible and hurt her neck. Not sure if she hit her head but did not have loss of consciousness. States she has not had any nausea or vomiting since the incident. States her neck hurts when she bends it. Her left shoulder hurts when she moves her arm. And her jaw is sore when she opens and closes her mouth. Patient states she was able to finish the last 2 hours of her shift after falling. Husband is with her and states that he has not noticed that she is acting any differently.Has not taken her aspirin in 3-4 days. Did have a left ankle fracture that is healing. It is still in a splint. Has a boot to wear at home   Past Medical History:  Diagnosis Date  . Depression    Dr. Mila Homer, [facility]; hospitalization 07/2010  . History of echocardiogram 2011   [facility]  . History of renal stone   . Hypertension   . Hypokalemia   . Obesity   . Schizophrenia (HCC)   . Wears glasses     Patient Active Problem List   Diagnosis Date Noted  . High risk medication use 07/25/2016  . Involuntary commitment   . Paranoid schizophrenia (HCC) 06/16/2015  . Noncompliance 06/16/2015  . Altered awareness, transient   . Altered mental status 02/19/2014  . Essential hypertension, benign 02/10/2012  . Tooth decay 02/10/2012  . Obesity 02/10/2012  . Impaired fasting blood sugar 02/10/2012    Past Surgical History:  Procedure Laterality Date  . WISDOM TOOTH EXTRACTION      Prior to Admission medications     Medication Sig Start Date End Date Taking? Authorizing Provider  amLODipine (NORVASC) 10 MG tablet Take 1 tablet (10 mg total) by mouth daily. Patient taking differently: Take 5 mg by mouth daily.  07/25/16  Yes Tysinger, Kermit Balo, PA-C  aspirin EC 81 MG tablet Take 1 tablet (81 mg total) by mouth daily. 07/25/16  Yes Tysinger, Kermit Balo, PA-C  benztropine (COGENTIN) 1 MG tablet Take 1 tablet (1 mg total) by mouth daily. Patient taking differently: Take 0.5 mg by mouth daily.  07/25/16  Yes Tysinger, Kermit Balo, PA-C  FLUoxetine (PROZAC) 40 MG capsule Take 1 capsule (40 mg total) by mouth daily. 07/25/16  Yes Tysinger, Kermit Balo, PA-C  LORazepam (ATIVAN) 0.5 MG tablet Take 0.5 mg by mouth daily as needed. 11/23/16  Yes [provider]  OLANZapine (ZYPREXA) 15 MG tablet Take 15 mg by mouth at bedtime. 11/23/16  Yes [provider]  potassium chloride (K-DUR) 10 MEQ tablet Take 1 tablet (10 mEq total) by mouth daily. 07/25/16  Yes Tysinger, Kermit Balo, PA-C    Allergies Paliperidone; Sulfa antibiotics; and Lisinopril  Family History  Problem Relation Age of Onset  . Diabetes Mother   . Kidney disease Mother        dialysis  . Heart disease Mother   . Cancer Maternal Uncle        lung  . Stroke  Neg Hx   . Hypertension Neg Hx   . Hyperlipidemia Neg Hx     Social History Social History  Substance Use Topics  . Smoking status: Never Smoker  . Smokeless tobacco: Never Used  . Alcohol use No    Review of Systems  Constitutional: No fever/chills Eyes: No visual changes. ENT: No sore throat. Respiratory: Denies cough Abdomen: Denies abdominal pain Genitourinary: Negative for dysuria. Musculoskeletal: Positive for neck pain, left shoulder pain, and left mandibular pain Skin: Negative for rash, but states she has a bruise on her left mandible    ____________________________________________   PHYSICAL EXAM:  VITAL SIGNS: ED Triage Vitals  Enc Vitals Group     BP 12/31/16  1652 (!) 143/85     Pulse Rate 12/31/16 1652 64     Resp 12/31/16 1652 18     Temp 12/31/16 1652 98.3 F (36.8 C)     Temp Source 12/31/16 1652 Oral     SpO2 12/31/16 1652 99 %     Weight 12/31/16 1652 171 lb (77.6 kg)     Height 12/31/16 1652 5' 10.5" (1.791 m)     Head Circumference --      Peak Flow --      Pain Score 12/31/16 1649 9     Pain Loc --      Pain Edu? --      Excl. in GC? --     Constitutional: Alert and oriented. Well appearing and in no acute distress. Eyes: Conjunctivae are normal. perrl eomi Head: Normocephalic. Left mandible is tender. Pain is reproduced with opening and closing of the jaw. Nose: No congestion/rhinnorhea. Mouth/Throat: Mucous membranes are moist.   Cardiovascular: Normal rate, regular rhythm. Respiratory: Normal respiratory effort.  No retractions GU: deferred Musculoskeletal: FROM all extremities, warm and well perfused. C-spine is a little tender at C5-C6. Left shoulder is tender along the ACJ and upper humerus. Patient is able to move all extremities without difficulty. Neurovascular is intact Neurologic:  Normal speech and language.  Skin:  Skin is warm, dry and intact. No rash noted. Small bruise on left mandible Psychiatric: Mood and affect are normal. Speech and behavior are normal.  ____________________________________________   LABS (all labs ordered are listed, but only abnormal results are displayed)  Labs Reviewed - No data to display ____________________________________________   ____________________________________________  RADIOLOGY  Negative xrays of cspine, left shoulder, and mandible  ____________________________________________   PROCEDURES  Procedure(s) performed:       ____________________________________________   INITIAL IMPRESSION / ASSESSMENT AND PLAN / ED COURSE  Pertinent labs & imaging results that were available during my care of the patient were reviewed by me and considered in my medical  decision making (see chart for details).  Patient appears well. Worker's Comp. forms were filled out. Drug screen to be performed by the medic ----------------------------------------- 6:28 PM on 12/31/2016 -----------------------------------------  Patient is walking around the room talking on the phone and appears in no acute distress. X-ray results are discussed. He should state she is feeling okay would like to go back to work. Patient will be prescribed medication for cervical strain. She will take an over-the-counter anti-inflammatory and I will prescribe a muscle relaxer: Robaxin. Patient is going to be given follow-up information for the [facility]. clinic and [facility]. She is to call and make an appointment. She states she understands all of the instructions.     ____________________________________________   FINAL CLINICAL IMPRESSION(S) / ED DIAGNOSES  Final diagnoses:  Acute strain of neck muscle, initial encounter  Contusion of face, initial encounter  Acute pain of left shoulder      NEW MEDICATIONS STARTED DURING THIS VISIT:  New Prescriptions   No medications on file     Note:  This document was prepared using Dragon voice recognition software and may include unintentional dictation errors.    Faythe Ghee, PA-C 12/31/16 Herminio Commons    Merrily Brittle, MD 12/31/16 2218

## 2016-12-31 NOTE — ED Notes (Signed)
Urine drug screen performed. No issues.  

## 2016-12-31 NOTE — Discharge Instructions (Signed)
Apply ice to the neck and shoulder area. Apply ice to the left jaw. Follow-up with the Worker's Comp. clinic at [facility] Take medication as prescribed. I will allow patient to return to work without restrictions at this time.

## 2016-12-31 NOTE — ED Triage Notes (Signed)
States slipped in water and fell approx 1330 today. No LOC. Pain L neck and shoulder.

## 2017-01-10 DIAGNOSIS — F209 Schizophrenia, unspecified: Secondary | ICD-10-CM | POA: Diagnosis not present

## 2017-01-20 DIAGNOSIS — F209 Schizophrenia, unspecified: Secondary | ICD-10-CM | POA: Diagnosis not present

## 2017-07-10 ENCOUNTER — Emergency Department (HOSPITAL_COMMUNITY)
Admission: EM | Admit: 2017-07-10 | Discharge: 2017-07-18 | Disposition: A | Discharge status: Other Acute Inpatient Hospital | Payer: BLUE CROSS/BLUE SHIELD | Source: Home / Self Care | Attending: Emergency Medicine | Admitting: Emergency Medicine

## 2017-07-10 ENCOUNTER — Other Ambulatory Visit: Payer: Self-pay

## 2017-07-10 ENCOUNTER — Encounter (HOSPITAL_COMMUNITY): Payer: Self-pay | Admitting: Emergency Medicine

## 2017-07-10 DIAGNOSIS — R5383 Other fatigue: Secondary | ICD-10-CM | POA: Insufficient documentation

## 2017-07-10 DIAGNOSIS — E876 Hypokalemia: Secondary | ICD-10-CM

## 2017-07-10 DIAGNOSIS — F209 Schizophrenia, unspecified: Secondary | ICD-10-CM | POA: Diagnosis not present

## 2017-07-10 DIAGNOSIS — E869 Volume depletion, unspecified: Secondary | ICD-10-CM | POA: Diagnosis not present

## 2017-07-10 DIAGNOSIS — R630 Anorexia: Secondary | ICD-10-CM | POA: Insufficient documentation

## 2017-07-10 DIAGNOSIS — Z9119 Patient's noncompliance with other medical treatment and regimen: Secondary | ICD-10-CM

## 2017-07-10 DIAGNOSIS — Z7982 Long term (current) use of aspirin: Secondary | ICD-10-CM

## 2017-07-10 DIAGNOSIS — F329 Major depressive disorder, single episode, unspecified: Secondary | ICD-10-CM | POA: Insufficient documentation

## 2017-07-10 DIAGNOSIS — Z79899 Other long term (current) drug therapy: Secondary | ICD-10-CM | POA: Insufficient documentation

## 2017-07-10 DIAGNOSIS — F250 Schizoaffective disorder, bipolar type: Secondary | ICD-10-CM | POA: Insufficient documentation

## 2017-07-10 DIAGNOSIS — N390 Urinary tract infection, site not specified: Secondary | ICD-10-CM

## 2017-07-10 DIAGNOSIS — F29 Unspecified psychosis not due to a substance or known physiological condition: Secondary | ICD-10-CM | POA: Diagnosis not present

## 2017-07-10 DIAGNOSIS — R627 Adult failure to thrive: Secondary | ICD-10-CM | POA: Diagnosis not present

## 2017-07-10 DIAGNOSIS — B965 Pseudomonas (aeruginosa) (mallei) (pseudomallei) as the cause of diseases classified elsewhere: Secondary | ICD-10-CM | POA: Diagnosis not present

## 2017-07-10 LAB — PREGNANCY, URINE: Preg Test, Ur: NEGATIVE

## 2017-07-10 LAB — CBC WITH DIFFERENTIAL/PLATELET
Basophils Absolute: 0 10*3/uL (ref 0.0–0.1)
Basophils Relative: 0 %
Eosinophils Absolute: 0.2 10*3/uL (ref 0.0–0.7)
Eosinophils Relative: 3 %
HCT: 41.9 % (ref 36.0–46.0)
Hemoglobin: 13.4 g/dL (ref 12.0–15.0)
Lymphocytes Relative: 31 %
Lymphs Abs: 2.1 10*3/uL (ref 0.7–4.0)
MCH: 28.8 pg (ref 26.0–34.0)
MCHC: 32 g/dL (ref 30.0–36.0)
MCV: 89.9 fL (ref 78.0–100.0)
Monocytes Absolute: 0.5 10*3/uL (ref 0.1–1.0)
Monocytes Relative: 7 %
Neutro Abs: 4 10*3/uL (ref 1.7–7.7)
Neutrophils Relative %: 59 %
Platelets: 376 10*3/uL (ref 150–400)
RBC: 4.66 MIL/uL (ref 3.87–5.11)
RDW: 13.8 % (ref 11.5–15.5)
WBC: 6.8 10*3/uL (ref 4.0–10.5)

## 2017-07-10 LAB — URINALYSIS, ROUTINE W REFLEX MICROSCOPIC
Bilirubin Urine: NEGATIVE
Glucose, UA: NEGATIVE mg/dL
Ketones, ur: 20 mg/dL — AB
Nitrite: NEGATIVE
Protein, ur: NEGATIVE mg/dL
Specific Gravity, Urine: 1.021 (ref 1.005–1.030)
pH: 5 (ref 5.0–8.0)

## 2017-07-10 LAB — COMPREHENSIVE METABOLIC PANEL
ALT: 26 U/L (ref 14–54)
AST: 23 U/L (ref 15–41)
Albumin: 4.4 g/dL (ref 3.5–5.0)
Alkaline Phosphatase: 74 U/L (ref 38–126)
Anion gap: 14 (ref 5–15)
BUN: 33 mg/dL — ABNORMAL HIGH (ref 6–20)
CO2: 27 mmol/L (ref 22–32)
Calcium: 9.5 mg/dL (ref 8.9–10.3)
Chloride: 103 mmol/L (ref 101–111)
Creatinine, Ser: 0.99 mg/dL (ref 0.44–1.00)
GFR calc Af Amer: >60 mL/min (ref >60)
GFR calc non Af Amer: >60 mL/min (ref >60)
Glucose, Bld: 96 mg/dL (ref 65–99)
Potassium: 2.6 mmol/L — CL (ref 3.5–5.1)
Sodium: 144 mmol/L (ref 135–145)
Total Bilirubin: 1 mg/dL (ref 0.3–1.2)
Total Protein: 8 g/dL (ref 6.5–8.1)

## 2017-07-10 NOTE — ED Provider Notes (Signed)
Patient placed in Quick Look pathway, seen and evaluated   Chief Complaint: confusion, noncompliance with medications  HPI:  Patient with history of AMS, paranoid schizophrenia, non-compliance with medications, HTN, presenting with 3 days of increased paranoia, refusing to drink or eat, increased confusion, depressed and non-compliant with medications.   ROS: Denies, fever, chills, abdominal pain, headache, dizziness, head trauma, nausea, vomiting, diarrhea.  Physical Exam:   Gen: No distress  Neuro: Awake and Alert  Skin: Warm    Focused Exam: Alert and oriented x 3, no cranial nerve deficits or loss of balance, 5/5 strenght in upper and lower extremities bilaterally.  Initiation of care has begun. The patient has been counseled on the process, plan, and necessity for staying for the completion/evaluation, and the remainder of the medical screening examination    Gregary CromerMitchell, Raetta Agostinelli B, PA-C 07/10/17 2105    Margarita Grizzleay, Danielle, MD 07/11/17 [phone]

## 2017-07-10 NOTE — ED Triage Notes (Signed)
Pt arrives with family member for changes in mentation x3 days - pt reports that she is skittish, slow to respond, worried/depressed. Pt alertx4. Pt denies SI/HI. Denies fevers, CP, SHOB, unilateral weakness/neuro deficits. Neuro check normal in triage, but pt is slightly aphasic.

## 2017-07-11 LAB — POTASSIUM: Potassium: 3.3 mmol/L — ABNORMAL LOW (ref 3.5–5.1)

## 2017-07-11 LAB — MAGNESIUM: Magnesium: 2.4 mg/dL (ref 1.7–2.4)

## 2017-07-11 MED ORDER — POTASSIUM CHLORIDE CRYS ER 20 MEQ PO TBCR
60.0000 meq | EXTENDED_RELEASE_TABLET | Freq: Once | ORAL | Status: DC
  Filled 2017-07-11: qty 3

## 2017-07-11 MED ORDER — ASPIRIN EC 81 MG PO TBEC
81.0000 mg | DELAYED_RELEASE_TABLET | Freq: Every day | ORAL | Status: DC
  Administered 2017-07-11 – 2017-07-18 (×6): 81 mg via ORAL
  Filled 2017-07-11 (×7): qty 1

## 2017-07-11 MED ORDER — OLANZAPINE 7.5 MG PO TABS
15.0000 mg | ORAL_TABLET | Freq: Every day | ORAL | Status: DC
  Administered 2017-07-11: 15 mg via ORAL
  Filled 2017-07-11 (×2): qty 2

## 2017-07-11 MED ORDER — AMLODIPINE BESYLATE 5 MG PO TABS
5.0000 mg | ORAL_TABLET | Freq: Every day | ORAL | Status: DC
  Administered 2017-07-11 – 2017-07-18 (×6): 5 mg via ORAL
  Filled 2017-07-11 (×8): qty 1

## 2017-07-11 MED ORDER — CEPHALEXIN 250 MG PO CAPS
500.0000 mg | ORAL_CAPSULE | Freq: Once | ORAL | Status: DC
  Filled 2017-07-11: qty 2

## 2017-07-11 MED ORDER — POTASSIUM CHLORIDE 10 MEQ/100ML IV SOLN
10.0000 meq | INTRAVENOUS | Status: AC
  Administered 2017-07-11: 10 meq via INTRAVENOUS
  Filled 2017-07-11: qty 100

## 2017-07-11 MED ORDER — CEPHALEXIN 250 MG PO CAPS
500.0000 mg | ORAL_CAPSULE | Freq: Four times a day (QID) | ORAL | Status: AC
  Administered 2017-07-11 – 2017-07-14 (×5): 500 mg via ORAL
  Filled 2017-07-11 (×9): qty 2

## 2017-07-11 MED ORDER — FLUOXETINE HCL 20 MG PO CAPS
40.0000 mg | ORAL_CAPSULE | Freq: Every day | ORAL | Status: DC
  Administered 2017-07-11 – 2017-07-18 (×7): 40 mg via ORAL
  Filled 2017-07-11 (×7): qty 2

## 2017-07-11 MED ORDER — BENZTROPINE MESYLATE 1 MG PO TABS
1.0000 mg | ORAL_TABLET | Freq: Every day | ORAL | Status: DC
  Administered 2017-07-11 – 2017-07-12 (×2): 1 mg via ORAL
  Filled 2017-07-11 (×3): qty 1

## 2017-07-11 NOTE — ED Notes (Signed)
Amy FavreClarence (husband)- [phone]

## 2017-07-11 NOTE — ED Notes (Signed)
Pt has now taken her medicine; husband remains at bedside; pt in street clothes (RN not insisting at this time in order to maintain therapeutic relationship). TTS occurning at this time

## 2017-07-11 NOTE — ED Notes (Signed)
ED Provider at bedside. 

## 2017-07-11 NOTE — ED Notes (Signed)
Amy Casey, from [facility]SS, has been psychologically cleared.

## 2017-07-11 NOTE — ED Notes (Signed)
Sitter at bedside; no needs 

## 2017-07-11 NOTE — ED Notes (Signed)
Pt only has a pair of underwear left and placed in locker 1; pt has orange flowered glasses at bedside; all other belongings taken home by spouse per off reporting RN-Monique,RN

## 2017-07-11 NOTE — ED Notes (Signed)
Attempted to start line- unsuccessful.

## 2017-07-11 NOTE — ED Notes (Signed)
Pt is having paranoid thoughts; refusing to take meds. States she does not know what it is and what for. RN provided education regarding UTI. Husband remains at bedside trying to encourage her. MD updated

## 2017-07-11 NOTE — ED Provider Notes (Signed)
[facility] Provider Note   CSN: [id number] Arrival date & time: 07/10/17  1952     History   Chief Complaint Chief Complaint  Patient presents with  . Depression  . Fatigue    HPI Amy Casey is a 57 y.o. female.  Patient with hx schizophrenia, c/o feeling tired, down, depressed.  Pt is very limited historian - level 5 caveat. Family notes pt refusing to eat/drink, having paranoid thoughts. Pt indicates is taking her normal meds. No SI.   The history is provided by the patient and a relative. The history is limited by the condition of the patient.  Depression     Past Medical History:  Diagnosis Date  . Depression    Dr. Mila HomerSena, [facility]; hospitalization 07/2010  . History of echocardiogram 2011   [facility]  . History of renal stone   . Hypertension   . Hypokalemia   . Obesity   . Schizophrenia (HCC)   . Wears glasses     Patient Active Problem List   Diagnosis Date Noted  . High risk medication use 07/25/2016  . Involuntary commitment   . Paranoid schizophrenia (HCC) 06/16/2015  . Noncompliance 06/16/2015  . Altered awareness, transient   . Altered mental status 02/19/2014  . Essential hypertension, benign 02/10/2012  . Tooth decay 02/10/2012  . Obesity 02/10/2012  . Impaired fasting blood sugar 02/10/2012    Past Surgical History:  Procedure Laterality Date  . WISDOM TOOTH EXTRACTION       OB History   None      Home Medications    Prior to Admission medications   Medication Sig Start Date End Date Taking? Authorizing Provider  amLODipine (NORVASC) 10 MG tablet Take 1 tablet (10 mg total) by mouth daily. Patient taking differently: Take 5 mg by mouth daily.  07/25/16   Tysinger, Kermit Baloavid S, PA-C  aspirin EC 81 MG tablet Take 1 tablet (81 mg total) by mouth daily. 07/25/16   Tysinger, Kermit Baloavid S, PA-C  benztropine (COGENTIN) 1 MG tablet Take 1 tablet (1 mg total) by mouth daily. Patient taking differently:  Take 0.5 mg by mouth daily.  07/25/16   Tysinger, Kermit Baloavid S, PA-C  FLUoxetine (PROZAC) 40 MG capsule Take 1 capsule (40 mg total) by mouth daily. 07/25/16   Tysinger, Kermit Baloavid S, PA-C  LORazepam (ATIVAN) 0.5 MG tablet Take 0.5 mg by mouth daily as needed. 11/23/16   [provider]  methocarbamol (ROBAXIN) 500 MG tablet Take 1 tablet (500 mg total) by mouth 4 (four) times daily. 12/31/16   Fisher, Roselyn BeringSusan W, PA-C  OLANZapine (ZYPREXA) 15 MG tablet Take 15 mg by mouth at bedtime. 11/23/16   [provider]  potassium chloride (K-DUR) 10 MEQ tablet Take 1 tablet (10 mEq total) by mouth daily. 07/25/16   Tysinger, Kermit Baloavid S, PA-C    Family History Family History  Problem Relation Age of Onset  . Diabetes Mother   . Kidney disease Mother        dialysis  . Heart disease Mother   . Cancer Maternal Uncle        lung  . Stroke Neg Hx   . Hypertension Neg Hx   . Hyperlipidemia Neg Hx     Social History Social History   Tobacco Use  . Smoking status: Never Smoker  . Smokeless tobacco: Never Used  Substance Use Topics  . Alcohol use: No  . Drug use: No     Allergies  Paliperidone; Sulfa antibiotics; and Lisinopril   Review of Systems Review of Systems  Unable to perform ROS: Psychiatric disorder  Constitutional: Negative for fever.  Psychiatric/Behavioral: Positive for depression and dysphoric mood. The patient is nervous/anxious.   level 5 caveat - psychiatric illness   Physical Exam Updated Vital Signs BP (!) 175/91 (BP Location: Left Arm)   Pulse 77   Temp (!) 97.5 F (36.4 C)   Resp 16   Ht 1.778 m (5\' 10" )   Wt 77.1 kg (170 lb)   SpO2 99%   BMI 24.39 kg/m   Physical Exam  Constitutional: She appears well-developed and well-nourished. No distress.  HENT:  Head: Atraumatic.  Eyes: Conjunctivae are normal. No scleral icterus.  Neck: Neck supple. No tracheal deviation present. No thyromegaly present.  Cardiovascular: Normal rate, regular rhythm, normal  heart sounds and intact distal pulses.  Pulmonary/Chest: Effort normal and breath sounds normal. No respiratory distress.  Abdominal: Soft. Normal appearance. She exhibits no distension. There is no tenderness.  Musculoskeletal: She exhibits no edema.  Neurological: She is alert.  Speech normal. Steady gait.   Skin: Skin is warm and dry. No rash noted. She is not diaphoretic.  Psychiatric:  Depressed mood.   Nursing note and vitals reviewed.    ED Treatments / Results  Labs (all labs ordered are listed, but only abnormal results are displayed) Results for orders placed or performed during the hospital encounter of 07/10/17  Comprehensive metabolic panel  Result Value Ref Range   Sodium 144 135 - 145 mmol/L   Potassium 2.6 (LL) 3.5 - 5.1 mmol/L   Chloride 103 101 - 111 mmol/L   CO2 27 22 - 32 mmol/L   Glucose, Bld 96 65 - 99 mg/dL   BUN 33 (H) 6 - 20 mg/dL   Creatinine, Ser 1.61 0.44 - 1.00 mg/dL   Calcium 9.5 8.9 - 09.6 mg/dL   Total Protein 8.0 6.5 - 8.1 g/dL   Albumin 4.4 3.5 - 5.0 g/dL   AST 23 15 - 41 U/L   ALT 26 14 - 54 U/L   Alkaline Phosphatase 74 38 - 126 U/L   Total Bilirubin 1.0 0.3 - 1.2 mg/dL   GFR calc non Af Amer >60 >60 mL/min   GFR calc Af Amer >60 >60 mL/min   Anion gap 14 5 - 15  CBC with Differential  Result Value Ref Range   WBC 6.8 4.0 - 10.5 K/uL   RBC 4.66 3.87 - 5.11 MIL/uL   Hemoglobin 13.4 12.0 - 15.0 g/dL   HCT 04.5 40.9 - 81.1 %   MCV 89.9 78.0 - 100.0 fL   MCH 28.8 26.0 - 34.0 pg   MCHC 32.0 30.0 - 36.0 g/dL   RDW 91.4 78.2 - 95.6 %   Platelets 376 150 - 400 K/uL   Neutrophils Relative % 59 %   Neutro Abs 4.0 1.7 - 7.7 K/uL   Lymphocytes Relative 31 %   Lymphs Abs 2.1 0.7 - 4.0 K/uL   Monocytes Relative 7 %   Monocytes Absolute 0.5 0.1 - 1.0 K/uL   Eosinophils Relative 3 %   Eosinophils Absolute 0.2 0.0 - 0.7 K/uL   Basophils Relative 0 %   Basophils Absolute 0.0 0.0 - 0.1 K/uL  Pregnancy, urine  Result Value Ref Range   Preg  Test, Ur NEGATIVE NEGATIVE  Urinalysis, Routine w reflex microscopic  Result Value Ref Range   Color, Urine YELLOW YELLOW   APPearance HAZY (A) CLEAR  Specific Gravity, Urine 1.021 1.005 - 1.030   pH 5.0 5.0 - 8.0   Glucose, UA NEGATIVE NEGATIVE mg/dL   Hgb urine dipstick SMALL (A) NEGATIVE   Bilirubin Urine NEGATIVE NEGATIVE   Ketones, ur 20 (A) NEGATIVE mg/dL   Protein, ur NEGATIVE NEGATIVE mg/dL   Nitrite NEGATIVE NEGATIVE   Leukocytes, UA SMALL (A) NEGATIVE   RBC / HPF 0-5 0 - 5 RBC/hpf   WBC, UA 6-30 0 - 5 WBC/hpf   Bacteria, UA RARE (A) NONE SEEN   Squamous Epithelial / LPF 0-5 (A) NONE SEEN   Mucus PRESENT    Non Squamous Epithelial 0-5 (A) NONE SEEN  Magnesium  Result Value Ref Range   Magnesium 2.4 1.7 - 2.4 mg/dL   No results found.  EKG None  Radiology No results found.  Procedures Procedures (including critical care time)  Medications Ordered in ED Medications - No data to display   Initial Impression / Assessment and Plan / ED Course  I have reviewed the triage vital signs and the nursing notes.  Pertinent labs & imaging results that were available during my care of the patient were reviewed by me and considered in my medical decision making (see chart for details).  Labs sent.   Labs reviewed. Possible uti. No cva tenderness or pain. Afebrile. Keflex po. k low. kcl po.  Po fluids.   BH team consulted.  Reviewed nursing notes and prior charts for additional history.   Disposition per [facility] team.  [facility] counselor states psych cleared. On discussion with patient, spouse, pts nurse, patient appears to have schizophrenia with acute psychosis, paranoia, refusal to take meds or eat - this is felt not likely related to possible mild uti (nitrite neg, small le) or low K - will tx for possible uti and replete k, and will ask [facility] team to re-evaluate.  Psych reassessment pending.   Signed out to Dr Rodena Medin to f/u on [facility] re-eval, and repeat k level (1800).   Pt  currently appears awake and alert, bp 158/83, rr 16, hr 78 - appears medically stable.       Final Clinical Impressions(s) / ED Diagnoses   Final diagnoses:  None    ED Discharge Orders    None       Cathren Laine, MD 07/11/17 [phone]

## 2017-07-11 NOTE — ED Notes (Signed)
Pt refusing to eat or drink

## 2017-07-11 NOTE — ED Notes (Signed)
Patient stood up and said "I have to pee" asked to get urine sample and patient said "too late, I already peed on myself"

## 2017-07-11 NOTE — ED Notes (Signed)
Husband took pt belongings and phone home. Pt only has eyeglasses left. Security called to wand patient

## 2017-07-11 NOTE — BH Assessment (Signed)
Tele Assessment Note   Patient Name: Amy Casey MRN: [id number] Referring Physician: Denton Casey Location of Patient: [facility] Location of Provider: Behavioral Health TTS Department  Amy Casey is a 57 y.o. female who presents to the ED with her husband due to increase in paranoia x 3 days, increased confusion and refusal to eat or drink. It has been noted that pt has a critically low potassium level and a UTI. Pt denies SI, HI, AVH. She does evidence minor thought blocking and confusion, but is generally able to answer questions appropriately. Her husband, Amy Casey, is in the room and participates in the assessment, as well. Pt is followed by Amy Casey and both she and husband attest that she is compliant with her medications. Mr. Amy Casey reports that pt has been eating and drinking, but her desire to do so has decreased and he decided to bring her to the ED, b/c he noticed the signs indicating that she was close to a mental health crisis.    Pt is psych cleared and recommended to stay in ED overnight to address medical concerns that are believed to be exacerbating schizophrenia. Amy Chamberakia Starkes, NP, discussed with EDP Amy Casey & RN, Amy Casey, made aware by TTS.   Diagnosis: Schizoaffective d/o, depressive type  Past Medical History:  Past Medical History:  Diagnosis Date  . Depression    Dr. Mila HomerSena, [facility]; hospitalization 07/2010  . History of echocardiogram 2011   [facility]  . History of renal stone   . Hypertension   . Hypokalemia   . Obesity   . Schizophrenia (HCC)   . Wears glasses     Past Surgical History:  Procedure Laterality Date  . WISDOM TOOTH EXTRACTION      Family History:  Family History  Problem Relation Age of Onset  . Diabetes Mother   . Kidney disease Mother        dialysis  . Heart disease Mother   . Cancer Maternal Uncle        lung  . Stroke Neg Hx   . Hypertension Neg Hx   . Hyperlipidemia Neg Hx     Social History:  reports that she has never  smoked. She has never used smokeless tobacco. She reports that she does not drink alcohol or use drugs.  Additional Social History:  Alcohol / Drug Use Pain Medications: see PTA meds Prescriptions: see PTA meds Over the Counter: see PTA meds History of alcohol / drug use?: No history of alcohol / drug abuse  CIWA: CIWA-Ar BP: (!) 175/91 Pulse Rate: 77 COWS:    Allergies:  Allergies  Allergen Reactions  . Paliperidone     Angioedema, drooling, slurred speech, ptosis  . Sulfa Antibiotics   . Lisinopril Rash    Angioedema     Home Medications:  (Not in a hospital admission)  OB/GYN Status:  No LMP recorded. (Menstrual status: Perimenopausal).  General Assessment Data [facility] Assessment: In system Is this a Tele or Face-to-Face Assessment?: Tele Assessment Is this an Initial Assessment or a Re-assessment for this encounter?: Initial Assessment Marital status: Married Is patient pregnant?: No Pregnancy Status: No Living Arrangements: Spouse/significant other, Other relatives Can pt return to current living arrangement?: Yes Admission Status: Voluntary Is patient capable of signing voluntary admission?: Yes Referral Source: Self/Family/Friend     Crisis Care Plan Living Arrangements: Spouse/significant other, Other relatives Name of Psychiatrist: Vesta Casey Name of Therapist: n/a  Education Status Is patient currently in school?: No Is the patient employed,  unemployed or receiving disability?: Employed  Risk to self with the past 6 months Suicidal Ideation: No Has patient been a risk to self within the past 6 months prior to admission? : No Suicidal Intent: No Has patient had any suicidal intent within the past 6 months prior to admission? : No Is patient at risk for suicide?: No Suicidal Plan?: No Has patient had any suicidal plan within the past 6 months prior to admission? : No Access to Means: No Previous Attempts/Gestures: Yes How  many times?: 1 Triggers for Past Attempts: Unknown Intentional Self Injurious Behavior: None Family Suicide History: Unknown Recent stressful life event(s): Other (Comment) Persecutory voices/beliefs?: No Depression: No Substance abuse history and/or treatment for substance abuse?: No Suicide prevention information given to non-admitted patients: Not applicable  Risk to Others within the past 6 months Homicidal Ideation: No Does patient have any lifetime risk of violence toward others beyond the six months prior to admission? : No Thoughts of Harm to Others: No Current Homicidal Intent: No Current Homicidal Plan: No Access to Homicidal Means: No History of harm to others?: No Assessment of Violence: None Noted Does patient have access to weapons?: No Criminal Charges Pending?: No Does patient have a court date: No Is patient on probation?: No  Psychosis Hallucinations: None noted Delusions: None noted  Mental Status Report Appearance/Hygiene: Unremarkable Eye Contact: Good Motor Activity: Unremarkable Speech: Slow, Soft Level of Consciousness: Quiet/awake Mood: Sad Affect: Blunted Anxiety Level: Minimal Thought Processes: Thought Blocking Judgement: Impaired Orientation: Person, Place, Time, Situation Obsessive Compulsive Thoughts/Behaviors: Unable to Assess  Cognitive Functioning Concentration: Fair Memory: Recent Intact, Remote Intact Is patient IDD: No Is patient DD?: No Insight: Fair Impulse Control: Fair Appetite: Poor Have you had any weight changes? : No Change Sleep: Decreased Vegetative Symptoms: None  ADLScreening [facility] Assessment Services) Patient's cognitive ability adequate to safely complete daily activities?: Yes Patient able to express need for assistance with ADLs?: Yes Independently performs ADLs?: Yes (appropriate for developmental age)  Prior Inpatient Therapy Prior Inpatient Therapy: Yes Prior Therapy Dates: pt reports 4 or 5 times w/  last time being a year ago Prior Therapy Facilty/Provider(s): several facilities, to include [facility] Reason for Treatment: schizophrenia  Prior Outpatient Therapy Prior Outpatient Therapy: No Does patient have an ACCT team?: No Does patient have Intensive In-House Services?  : No Does patient have [facility] services? : Yes Does patient have P4CC services?: No  ADL Screening (condition at time of admission) Patient's cognitive ability adequate to safely complete daily activities?: Yes Is the patient deaf or have difficulty hearing?: No Does the patient have difficulty seeing, even when wearing glasses/contacts?: No Does the patient have difficulty concentrating, remembering, or making decisions?: No Patient able to express need for assistance with ADLs?: Yes Does the patient have difficulty dressing or bathing?: No Independently performs ADLs?: Yes (appropriate for developmental age) Does the patient have difficulty walking or climbing stairs?: No Weakness of Legs: None Weakness of Arms/Hands: None  Home Assistive Devices/Equipment Home Assistive Devices/Equipment: None    Abuse/Neglect Assessment (Assessment to be complete while patient is alone) Abuse/Neglect Assessment Can Be Completed: Unable to assess, patient is non-responsive or altered mental status     Advance Directives (For Healthcare) Does Patient Have a Medical Advance Directive?: No Would patient like information on creating a medical advance directive?: No - Patient declined Nutrition Screen- [facility] Adult/WL/AP Patient's home diet: Regular Has the patient recently lost weight without trying?: No Has the patient been eating poorly because of  a decreased appetite?: Yes Malnutrition Screening Tool Score: 1        Disposition:  Disposition Initial Assessment Completed for this Encounter: Yes(consulted with Amy Chamber) Patient referred to: Other (Comment)(Recommended to stay overnight to address medical  concerns)  This service was provided via telemedicine using a 2-way, interactive audio and video technology.  Names of all persons participating in this telemedicine service and their role in this encounter.   Laddie Aquas 07/11/2017 11:44 AM

## 2017-07-11 NOTE — ED Notes (Signed)
Husband at bedside. No needs.

## 2017-07-12 ENCOUNTER — Encounter (HOSPITAL_COMMUNITY): Payer: Self-pay | Admitting: Behavioral Health

## 2017-07-12 MED ORDER — SODIUM CHLORIDE 0.9 % IV BOLUS
1000.0000 mL | Freq: Once | INTRAVENOUS | Status: AC
  Administered 2017-07-12: 1000 mL via INTRAVENOUS

## 2017-07-12 MED ORDER — BENZTROPINE MESYLATE 1 MG/ML IJ SOLN
1.0000 mg | Freq: Once | INTRAMUSCULAR | Status: DC
  Filled 2017-07-12: qty 2

## 2017-07-12 MED ORDER — BENZTROPINE MESYLATE 1 MG/ML IJ SOLN: 1.0000 mg | Freq: Once | INTRAMUSCULAR | Status: DC

## 2017-07-12 NOTE — ED Notes (Signed)
Pt. Found standing up covered in urine. Pt. Appears anxious. Pt. Changed out of scrubs, socks and brief placed on pt. Pt. Currently sitting on side of bed.

## 2017-07-12 NOTE — ED Notes (Signed)
Sat down with pt. Pt. Able to tell me her name and where she is currently. Explained there purpose of the keflex and pt. Refused to take them. Explained that the medication is to help her and so am I. Pt. Still refused medications. Will continue to monitor.

## 2017-07-12 NOTE — ED Notes (Signed)
Behavioral health called to state patient is cleared to go home. Pt is refusing to take any antibiotics or psych medications here, has been staring at the wall for hours during and prior to this RN taking over her care. Pt will not respond to this RN either. Will inform ED provider of this to provide best care for patient

## 2017-07-12 NOTE — ED Notes (Signed)
Declined shower. 

## 2017-07-12 NOTE — ED Provider Notes (Signed)
RN did not feel comfortable sending patient home.  I reevaluated the patient.  She was staring straight ahead with a flat affect.  She was alert and oriented x3.  She was dehydrated.  I ordered 1 L of IV fluids and consulted general medicine.  General medicine and myself did not think she was an appropriate medical admission.  I then discussed the case again with behavioral health.  They will reconsult Thursday morning.   Donnetta Hutchingook, Kalila Adkison, MD 07/12/17 2150

## 2017-07-12 NOTE — ED Notes (Signed)
Amy PalmsYelverton, MD aware of pt not interacting and not taking her meds, TTS to call MD and discuss need for another TTS eval

## 2017-07-12 NOTE — Consult Note (Signed)
  Tele Assessment   [name], 57 y.o., female patient  seen via telepsych by TTS  and this provider; chart reviewed and consulted with Dr. Lucianne MussKumar on 07/12/17.  On evaluation Amy Casey sitting on bedside with her son [name] at her side. Patient's son states that he last saw his mother last week when she was at work.  States that his mother prior to this admission was able to work, drive, and care for her self.  Patient is able to speak but at a whisper and with some thought blocking.  Patient denies suicidal/self harm/homicidal ideation, psychosis, and paranoia.  Patient did state that she was having some stiffness or locking in her jaw prior to admission on and off.  Patient's son is at bedside at who is helping with understanding patient since she is speaking too low to actually here. Potassium level up to 3.3 from 2.6.  Patient has been started on Keflex 07/11/17 for urinary tract infection   Recommendations: Possible EPS related to the Zyprexa along with UTI.      Spoke with Dr.Yelverton related to assessment and possible EPS recommended 1 mg Cogentin IM and no improvement 1-2 hours give another 1 mg IM.  May need to adjust Cogentin dosage with home medication if related to EPS   [name], NP

## 2017-07-12 NOTE — ED Notes (Signed)
Ranae PalmsYelverton, Md to eval pt d/t concerns re: pt receiving oral medications, pt has required repetitive questions with very limited verbal communication, pt declines breakfast, is not wanting breakfast and declining to interact with this RN,pt sitting on the bed with mouth open and declines to sit back in bed, pt visualized from Nurses desk and by sitting staff, will continue to monitor, holding PO meds at this time

## 2017-07-12 NOTE — ED Notes (Signed)
Pts son at bedside, TTS being completed at this time

## 2017-07-12 NOTE — Progress Notes (Signed)
CSW informed Joni Reining, RN  ED that per Assunta Found, NP, pt has been psychiatrically cleared. RN voiced concerns about pt being cleared as pt has been "staring at the wall all day" and refusing meds. CSW encouraged RN to inform EDP of her concerns before pt is medically discharged.   Wells Guiles, LCSW, LCAS Disposition CSW [facility]/TTS [phone]

## 2017-07-12 NOTE — BH Assessment (Signed)
Tele Assessment Note   Patient Name: Amy Casey MRN: [id number] Referring Physician: EDP Location of Patient: [facility] Location of Provider: Behavioral Health TTS Department  Amy QualeBeverly Pete is a 57 y.o. female who [facility] in apparent state of confusion.  TTS was consulted, Pt was psychiatrically cleared, and she was referred for medical treatment.  [facility] called in another TTS consult, which was completed by Thereasa Parkinauthor and S. Rankin, NP.  Pt spoke slowly and softly..  She proved a poor historian.  Pt's son, Amy Casey, assisted in providing history.  Pt has a diagnosis of schizophrenia.  She is under psychiatric care, but she cannot recall the name of the doctor.  Pt endorsed a history of hallucination, and a generalized belief that someone may be trying to harm her, but she could not provide specifics.  Per Pt's son, Pt was active, engaged, and working a week ago.  Over the last week, per son's report, Pt stopped taking medication and stopped eating.  Pt spoke very slowly, softly.  Mouth was rigid.  Per S. Rankin, NP, Pt is to administered Cogentin intravenously to ease rigidity, to help resume medication.  Pt to be psych cleared.    Diagnosis: Schizophrenia  Past Medical History:  Past Medical History:  Diagnosis Date  . Depression    Dr. Mila HomerSena, [facility]; hospitalization 07/2010  . History of echocardiogram 2011   [facility]  . History of renal stone   . Hypertension   . Hypokalemia   . Obesity   . Schizophrenia (HCC)   . Wears glasses     Past Surgical History:  Procedure Laterality Date  . WISDOM TOOTH EXTRACTION      Family History:  Family History  Problem Relation Age of Onset  . Diabetes Mother   . Kidney disease Mother        dialysis  . Heart disease Mother   . Cancer Maternal Uncle        lung  . Stroke Neg Hx   . Hypertension Neg Hx   . Hyperlipidemia Neg Hx     Social History:  reports that she has never smoked. She has never used  smokeless tobacco. She reports that she does not drink alcohol or use drugs.  Additional Social History:  Alcohol / Drug Use Pain Medications: See MAR Prescriptions: See MAR Over the Counter: See MAR History of alcohol / drug use?: No history of alcohol / drug abuse  CIWA: CIWA-Ar BP: (!) 136/104 Pulse Rate: 89 COWS:    Allergies:  Allergies  Allergen Reactions  . Paliperidone     Angioedema, drooling, slurred speech, ptosis  . Sulfa Antibiotics   . Lisinopril Rash    Angioedema     Home Medications:  (Not in a hospital admission)  OB/GYN Status:  No LMP recorded. (Menstrual status: Perimenopausal).  General Assessment Data [facility] Assessment: In system Is this a Tele or Face-to-Face Assessment?: Tele Assessment Is this an Initial Assessment or a Re-assessment for this encounter?: Initial Assessment Marital status: Married Is patient pregnant?: No Pregnancy Status: No Living Arrangements: Spouse/significant other, Other relatives Can pt return to current living arrangement?: Yes Admission Status: Voluntary Is patient capable of signing voluntary admission?: Yes Referral Source: Self/Family/Friend Insurance type: BCBS     Crisis Care Plan Living Arrangements: Spouse/significant other, Other relatives Name of Psychiatrist: [facility] Name of Therapist: n/a  Education Status Is patient currently in school?: No Is the patient employed, unemployed or receiving  disability?: Employed  Risk to self with the past 6 months Suicidal Ideation: No Has patient been a risk to self within the past 6 months prior to admission? : No Suicidal Intent: No Has patient had any suicidal intent within the past 6 months prior to admission? : No Is patient at risk for suicide?: No Suicidal Plan?: No Has patient had any suicidal plan within the past 6 months prior to admission? : No Access to Means: No What has been your use of drugs/alcohol within the last  12 months?: Denied Previous Attempts/Gestures: No How many times?: 1 Triggers for Past Attempts: Unknown Intentional Self Injurious Behavior: None Family Suicide History: Unknown Recent stressful life event(s): Other (Comment)(Not taking medication) Persecutory voices/beliefs?: No Depression: No Substance abuse history and/or treatment for substance abuse?: No Suicide prevention information given to non-admitted patients: Not applicable  Risk to Others within the past 6 months Homicidal Ideation: No Does patient have any lifetime risk of violence toward others beyond the six months prior to admission? : No Thoughts of Harm to Others: No Current Homicidal Intent: No Current Homicidal Plan: No Access to Homicidal Means: No History of harm to others?: No Assessment of Violence: None Noted Does patient have access to weapons?: No Criminal Charges Pending?: No Does patient have a court date: No Is patient on probation?: No  Psychosis Hallucinations: None noted Delusions: None noted  Mental Status Report Appearance/Hygiene: Unremarkable Eye Contact: Poor Motor Activity: Rigidity Speech: Slow, Soft Level of Consciousness: Quiet/awake Mood: Sad Affect: Flat Anxiety Level: None Thought Processes: Thought Blocking Judgement: Impaired Orientation: Unable to assess Obsessive Compulsive Thoughts/Behaviors: None  Cognitive Functioning Concentration: Poor Memory: Unable to Assess Is patient IDD: No Is patient DD?: No Insight: Unable to Assess Impulse Control: Unable to Assess Appetite: Poor Have you had any weight changes? : No Change Sleep: Decreased Vegetative Symptoms: None  ADLScreening [facility] Assessment Services) Patient's cognitive ability adequate to safely complete daily activities?: Yes Patient able to express need for assistance with ADLs?: Yes Independently performs ADLs?: Yes (appropriate for developmental age)  Prior Inpatient Therapy Prior Inpatient Therapy:  Yes Prior Therapy Dates: pt reports 4 or 5 times w/ last time being a year ago Prior Therapy Facilty/Provider(s): several facilities, to include [facility] Reason for Treatment: schizophrenia  Prior Outpatient Therapy Prior Outpatient Therapy: Yes Prior Therapy Dates: Ongoing Prior Therapy Facilty/Provider(s): Cannot remember name of psychiatrist Reason for Treatment: schizophrenia Does patient have an ACCT team?: No Does patient have Intensive In-House Services?  : No Does patient have [facility] services? : No Does patient have P4CC services?: No  ADL Screening (condition at time of admission) Patient's cognitive ability adequate to safely complete daily activities?: Yes Is the patient deaf or have difficulty hearing?: No Does the patient have difficulty seeing, even when wearing glasses/contacts?: No Does the patient have difficulty concentrating, remembering, or making decisions?: No Patient able to express need for assistance with ADLs?: Yes Does the patient have difficulty dressing or bathing?: No Independently performs ADLs?: Yes (appropriate for developmental age) Does the patient have difficulty walking or climbing stairs?: No Weakness of Legs: None Weakness of Arms/Hands: None  Home Assistive Devices/Equipment Home Assistive Devices/Equipment: None  Therapy Consults (therapy consults require a physician order) PT Evaluation Needed: No OT Evalulation Needed: No SLP Evaluation Needed: No Abuse/Neglect Assessment (Assessment to be complete while patient is alone) Abuse/Neglect Assessment Can Be Completed: Unable to assess, patient is non-responsive or altered mental status   Consults Spiritual Care Consult Needed: No Social  Work Consult Needed: No Merchant navy officer (For Healthcare) Does Patient Have a Programmer, multimedia?: No Would patient like information on creating a medical advance directive?: No - Patient declined Nutrition Screen- [facility] Adult/WL/AP Patient's  home diet: Regular Has the patient recently lost weight without trying?: No Has the patient been eating poorly because of a decreased appetite?: Yes Malnutrition Screening Tool Score: 1  Additional Information 1:1 In Past 12 Months?: No CIRT Risk: No Elopement Risk: No     Disposition:  Disposition Initial Assessment Completed for this Encounter: Yes Disposition of Patient: Discharge(Per S. Rankin, NP, Pt does not meet inpt criteria) Patient referred to: Other (Comment)(S. Rankin, NP consulted with medical)  This service was provided via telemedicine using a 2-way, interactive audio and video technology.  Names of all persons participating in this telemedicine service and their role in this encounter. Name: Amy Casey Role: Patient  Name: Amy Coder Role: Brother  Name: Ephriam Knuckles Role: NP  Name:  Levonne Spiller Role:  TTS    Dorris Fetch Krimson Massmann 07/12/2017 1:16 PM

## 2017-07-12 NOTE — ED Notes (Signed)
Pt required multiple attempts at taking oral meds, pt would not take meds from this RN, pt took meds from her son with much encouragement, this RN thanked the pt for taking medications, will continue to monitor

## 2017-07-12 NOTE — ED Notes (Signed)
Spoke with Adriana Simasook, MD. Pt. To be reevaluated in the morning due to admitting MD and Adriana Simasook MD agreeing that pt. Is not fit to go home. Will make morning RN aware

## 2017-07-12 NOTE — ED Notes (Signed)
Attempted to give keflex and cogentin, pt would not make eye contact with nurse or answer questions. When attempted to give medications pt was very wary, unable to give meds, pt kept withdrawing her hand to hold medications and would not allow this RN to give cogentin through IV

## 2017-07-12 NOTE — ED Notes (Signed)
Regular Diet was ordered for Dinner. 

## 2017-07-12 NOTE — ED Notes (Signed)
Attempted again to give medications pt stated "no, this is not good" would not take medications and would not respond to any further questions and will not eat anything on her dinner tray

## 2017-07-13 ENCOUNTER — Encounter (HOSPITAL_COMMUNITY): Payer: Self-pay | Admitting: Registered Nurse

## 2017-07-13 MED ORDER — OLANZAPINE 5 MG PO TBDP
15.0000 mg | ORAL_TABLET | Freq: Two times a day (BID) | ORAL | Status: DC
  Administered 2017-07-14 (×2): 15 mg via ORAL
  Filled 2017-07-13 (×3): qty 3

## 2017-07-13 MED ORDER — BENZTROPINE MESYLATE 1 MG/ML IJ SOLN
0.5000 mg | Freq: Two times a day (BID) | INTRAMUSCULAR | Status: DC
  Filled 2017-07-13 (×2): qty 2

## 2017-07-13 MED ORDER — CLONAZEPAM 0.125 MG PO TBDP
0.2500 mg | ORAL_TABLET | Freq: Two times a day (BID) | ORAL | Status: DC | PRN
  Administered 2017-07-15 – 2017-07-18 (×4): 0.25 mg via ORAL
  Filled 2017-07-13 (×4): qty 2

## 2017-07-13 MED ORDER — CLONAZEPAM 0.125 MG PO TBDP
0.2500 mg | ORAL_TABLET | Freq: Two times a day (BID) | ORAL | Status: DC
  Administered 2017-07-14 – 2017-07-18 (×7): 0.25 mg via ORAL
  Filled 2017-07-13 (×8): qty 2

## 2017-07-13 NOTE — ED Notes (Signed)
Pt refuses all medicine- talking very quiet, denies hearing any voices, unable to understand or hear all of pt's responses. Pt states "I gotta pee" attempted to get up, then sat back down, refused any help - would not go to the bathroom. Will not answer when asked if she is wet or incontinent.

## 2017-07-13 NOTE — ED Notes (Signed)
Pt sitting up on edge of bed for some time now. Pt is alert presenting flat affect.

## 2017-07-13 NOTE — Consult Note (Signed)
Tele psych Reassessment     Amy Casey, 57 y.o., [facility] complaints altered mental status (confusion) and paranoia.  Patient presentation also warranted possible UTI during her initial admission.  Patient has history of schizophrenia and current home psychotropic medications were Zyprexa 15 mg Q hs, Prozac 40 mg QD, Cogentin 1 mg QD.  On previous tele assessment patient reported that she was having some locking of her jaw prior to her admission; noted patient behavior could be related to possible EPS (extrapyramidal symptoms) related to Zyprexa.  Spoke with EDP (Dr. Ranae PalmsYelverton yesterday with recommendation of giving patient IM Cogentin to see if any improvement and if did improve patient could be discharged with 1 mg Cogentin Daily. TTS spoke with Dr. Adriana Simasook this morning who would like another assessment because it does not seem that there was any improvement in patient.    Reassessment 07/13/17 Amy Casey, 57 y.o., female patient seen via telepsych by this provider; chart reviewed and consulted with Dr. Lucianne MussKumar on 07/13/17.  On evaluation Amy Casey sitting on side of bed.  Patient continues to be oriented to self and place.  Patient appears to have some thought blocking, and speech is at a whisper.  When asked if she had paranoia no answer was given.  When asked if her son will be coming back today patient responded " I don't know" nursing reports the patient has been very paranoid and is refusing to take her medications or eat.  There was no improvement with the Cogentin yesterday.  Recommendations: Change Zyprexa 15 mg to Zyprexa Zydis 7.5 mg twice daily and Cogentin 0.5 mg twice daily; add Klonopin  Disposition: Recommend psychiatric Inpatient admission when medically cleared. Denice Bors.   Shuvon B. Rankin, NP

## 2017-07-13 NOTE — ED Notes (Signed)
Message to admit for IP treatment with medication adjustment relayed to emergency MD

## 2017-07-13 NOTE — ED Notes (Signed)
Patient denies pain and is resting comfortably.  

## 2017-07-13 NOTE — ED Notes (Signed)
Pt sitting on edge of bed, staring at wall- pt barely talking, whispering to answer questions. Refusing all care- rocking back and forth

## 2017-07-13 NOTE — ED Notes (Signed)
Son -- Amy Casey will return at 1730 for visiting time -- [phone]

## 2017-07-13 NOTE — ED Notes (Signed)
Pt's son bedside; pt continues to refuse all medication despite son's involvement. Pt assisted to bathroom with son and sitter after urinating in her clothes after repeatedly asked if she needed to use the restroom and replying no.

## 2017-07-14 NOTE — ED Notes (Signed)
Pt sleeping soundly. Snoring at times. NAD.

## 2017-07-14 NOTE — ED Notes (Addendum)
Pt refused Cogentin shot; pt states it makes her sick; patient is alert and speaking in full sentences and able to be understood by staff-; Pt states she wants to go home- [facility],RN

## 2017-07-14 NOTE — ED Notes (Signed)
Breakfast tray ordered 

## 2017-07-14 NOTE — ED Notes (Signed)
Regular Diet was ordered for Lunch. 

## 2017-07-14 NOTE — ED Notes (Signed)
Pt speaking with Daughter who called in; Pt just sitting up in chair not talking much-Monique,RN

## 2017-07-14 NOTE — ED Notes (Addendum)
Pt did not want this RN to check her VS. Did not want to upset pt. NAD. Tried to get patient to eat something. Pt refused.

## 2017-07-14 NOTE — ED Notes (Signed)
Pt spoke with husband on the phone, pt was engaged in conversation with him and verbalized the day of the week to him in their conversation, pt cooperative at this time

## 2017-07-14 NOTE — ED Notes (Signed)
Pt sleeping NAD noted. Sitter at bedside

## 2017-07-14 NOTE — ED Notes (Signed)
pts son visiting, pts son assisting with giving pt meds, pt requires multiple attempts to comply with medication administration, the pt is refusing food from the son, pt ambulated to bathroom previously with assistance

## 2017-07-14 NOTE — ED Notes (Signed)
Son at bedside assisting the pt with eating lunch, pt interacting more with son during this visit

## 2017-07-14 NOTE — ED Notes (Signed)
In to speak with pt. Pt very soft spoken, almost where you can't hear her. Pt refusing to eat. Sitting on the side of the bed, staring at the wall. Sitter at bedside. Ask pt if she needed to go to the restroom, and pt states "no". Pt does have a depends on and said it was dry at this time. Will continue to monitor.

## 2017-07-15 ENCOUNTER — Other Ambulatory Visit: Payer: Self-pay

## 2017-07-15 LAB — CBC WITH DIFFERENTIAL/PLATELET
Basophils Absolute: 0 10*3/uL (ref 0.0–0.1)
Basophils Relative: 0 %
Eosinophils Absolute: 0.3 10*3/uL (ref 0.0–0.7)
Eosinophils Relative: 4 %
HCT: 46.8 % — ABNORMAL HIGH (ref 36.0–46.0)
Hemoglobin: 15 g/dL (ref 12.0–15.0)
Lymphocytes Relative: 27 %
Lymphs Abs: 2.1 10*3/uL (ref 0.7–4.0)
MCH: 29.2 pg (ref 26.0–34.0)
MCHC: 32.1 g/dL (ref 30.0–36.0)
MCV: 91.1 fL (ref 78.0–100.0)
Monocytes Absolute: 0.4 10*3/uL (ref 0.1–1.0)
Monocytes Relative: 5 %
Neutro Abs: 5.1 10*3/uL (ref 1.7–7.7)
Neutrophils Relative %: 64 %
Platelets: 302 10*3/uL (ref 150–400)
RBC: 5.14 MIL/uL — ABNORMAL HIGH (ref 3.87–5.11)
RDW: 13.2 % (ref 11.5–15.5)
WBC: 7.8 10*3/uL (ref 4.0–10.5)

## 2017-07-15 LAB — COMPREHENSIVE METABOLIC PANEL
ALT: 26 U/L (ref 14–54)
AST: 22 U/L (ref 15–41)
Albumin: 4.2 g/dL (ref 3.5–5.0)
Alkaline Phosphatase: 79 U/L (ref 38–126)
Anion gap: 14 (ref 5–15)
BUN: 32 mg/dL — ABNORMAL HIGH (ref 6–20)
CO2: 25 mmol/L (ref 22–32)
Calcium: 9.6 mg/dL (ref 8.9–10.3)
Chloride: 109 mmol/L (ref 101–111)
Creatinine, Ser: 0.87 mg/dL (ref 0.44–1.00)
GFR calc Af Amer: >60 mL/min (ref >60)
GFR calc non Af Amer: >60 mL/min (ref >60)
Glucose, Bld: 87 mg/dL (ref 65–99)
Potassium: 3.2 mmol/L — ABNORMAL LOW (ref 3.5–5.1)
Sodium: 148 mmol/L — ABNORMAL HIGH (ref 135–145)
Total Bilirubin: 0.7 mg/dL (ref 0.3–1.2)
Total Protein: 8.4 g/dL — ABNORMAL HIGH (ref 6.5–8.1)

## 2017-07-15 LAB — MAGNESIUM: Magnesium: 2.4 mg/dL (ref 1.7–2.4)

## 2017-07-15 LAB — CK: Total CK: 54 U/L (ref 38–234)

## 2017-07-15 MED ORDER — BENZTROPINE MESYLATE 1 MG/ML IJ SOLN: 1.0000 mg | Freq: Every day | INTRAMUSCULAR | Status: DC

## 2017-07-15 MED ORDER — BENZTROPINE MESYLATE 1 MG PO TABS
1.0000 mg | ORAL_TABLET | Freq: Every day | ORAL | Status: DC
  Administered 2017-07-15 – 2017-07-17 (×3): 1 mg via ORAL
  Filled 2017-07-15 (×4): qty 1

## 2017-07-15 MED ORDER — CEPHALEXIN 250 MG PO CAPS
500.0000 mg | ORAL_CAPSULE | Freq: Two times a day (BID) | ORAL | Status: DC
  Administered 2017-07-15 – 2017-07-17 (×4): 500 mg via ORAL
  Filled 2017-07-15 (×4): qty 2

## 2017-07-15 MED ORDER — OLANZAPINE 5 MG PO TBDP
7.5000 mg | ORAL_TABLET | Freq: Every day | ORAL | Status: DC
  Administered 2017-07-16 – 2017-07-17 (×2): 7.5 mg via ORAL
  Filled 2017-07-15: qty 1.5

## 2017-07-15 NOTE — ED Notes (Signed)
Attempted to draw lab work from [employer] - unsuccessful. Phlebotomist aware.

## 2017-07-15 NOTE — ED Notes (Signed)
Charge RN aware Staffing Office advised does not have Sitter available for pt. Pt being monitored by staff.

## 2017-07-15 NOTE — ED Notes (Signed)
Pt unable to provide urine specimen. Dr Rush Landmarkegeler in w/pt and family.

## 2017-07-15 NOTE — ED Notes (Signed)
Shuvon, NP, [facility], calling Dr Dalene SeltzerSchlossman to give med recommendations d/t pt's behavior.

## 2017-07-15 NOTE — ED Notes (Signed)
Pt remains sitting on side of bed. Attempted to assist pt to bathroom - pt refusing. Pt noted to be dry - no incont noted at this time.

## 2017-07-15 NOTE — ED Notes (Signed)
Dr Tegeler aware pt's labs have resulted.

## 2017-07-15 NOTE — ED Notes (Addendum)
Pt took meds w/much encouragement. Pt refuses to allow RN to assess if she is wet d/t urinary incont. Pt offered shower multiple times - continues to refuse.

## 2017-07-15 NOTE — ED Provider Notes (Signed)
1:46 PM Nursing requested I come evaluate the patient to reassess management.  Nursing reports that the patient has not eaten any food and has consumed minimal fluid over the last 2 days.  Therefore she is not taking her medications and is not taking the antibiotic she was prescribed for the urinary tract infection she was found to have several days ago.  Patient has been in the emergency department for several days.    On my assessment, patient is staring blankly on the wall and is only answering some questions with short answers.  Patient has a very flat affect.  Patient is barely engaging in conversation.  Patient does report that she is having continued urinary frequency.  Patient was refusing to use a urinal and nursing to allow nursing to collect repeat specimen or urine culture.  Therefore the patient is urinating on herself when she urinates small amounts.  Chart review shows that patient also had left foot abnormality several days ago and was given oral potassium.  Patient subsequent has not been taking the potassium.  Clinically, I am concerned that the patient is sitting and not moving staring at a wall and appears to be in a somewhat catatonic state.  Has her lab work has not been performed for several days and her not eating or drinking in several days, will repeat blood work.  I am concerned she is not going to be able to maintain hydration or nutrition as well as treat her urinary tract infection that was discovered orally since she is not eating p.o.  If repeat blood work is concerning, patient may require admission for further medical management prior to going to inpatient psychiatric hospital for further psychiatric management.  Patient denies SI or HI on my inquiry.  6:46 PM Patient's repeat blood work was overall reassuring.  Potassium had only slightly decreased and creatinine was similar.  No evidence of AKI in the setting of the dehydraton.  CBC  was reassuring.  CK was  normal and improved.    Patient's family arrived in a shared decision making conversation was held.  They agree that patient is still not at her mental status baseline and needs further psychiatric management.  As nursing was able to get the patient to take medications, patient's antibiotics will be reordered orally for the next week and she will continue the medication management as outlined by psychiatry.  Do not feel patient needs IVC or forced medications at this time as she was able to take them with encouragement.  Family agreed with continued plan of care.  Anticipate admission to psychiatry and do not feel patient is medical admission at this time.     Jayion Schneck, Canary Brimhristopher J, MD 07/16/17 [phone]

## 2017-07-15 NOTE — ED Notes (Signed)
Pt has orange-colored eyeglasses on bedside table.

## 2017-07-15 NOTE — ED Notes (Signed)
Pt sitting on the side of the bed, staring at the wall. This RN in to see pt and speak with her. Pt will answer, in a quiet whisper only.  Looks down.  Denies being hungry, thirsty or having to go to the restroom at this time.

## 2017-07-15 NOTE — ED Notes (Signed)
Offered pt snack and drink. Pt refused.

## 2017-07-15 NOTE — ED Notes (Signed)
Continues to sit on bed, alert. Pt refuses to lie down - continues to refuse to eat/drink/empty bladder.

## 2017-07-15 NOTE — ED Notes (Signed)
Encouraged pt to drink water - refuses - stating in whispering voice "I don't want water". Offered pt soda or juice - continues to refuse.

## 2017-07-15 NOTE — ED Provider Notes (Deleted)
1:35 PM    Tegeler, Canary Brimhristopher J, MD 07/15/17 [phone]

## 2017-07-15 NOTE — ED Notes (Signed)
Sitting on side of bed - refusing to eat.

## 2017-07-15 NOTE — ED Notes (Signed)
Pt refusing to ambulate to bathroom and refusing for in and out cath to be performed. Pt also refusing to eat / drink even after much encouragement. RN has attempted multiple times. Pt continues to sit on side of bed. Pt states "I don't want to" in soft voice.

## 2017-07-15 NOTE — Consult Note (Signed)
  Medication recommendations   First medication recommendations were made 07/12/17:  Patient demonstrated symptoms of EPS and it was recommended Zyprexa 15 mg Q hs;  1 mg Cogentin IM and if no improvement repeat in 1-2 hours and that there would possible be a need to adjust the dosage of Cogent with home medication of Zyprexa.  Patient was reassessed 07/13/17 after being informed that patient was not doing any better.  After consulting with Dr. Lucianne MussKumar recommended that Zyprexa Zydis be changed to 7.5 mg bid because thought that patient may be getting to much at one time and also change Cogentin to 0.5 mg Bid; Add Klonopin.  After reviewing chart/medications:  Patient has been refusing Cogentin (which if EPS related to Zyprexa can cause worsening of condition);  and current order is Zyprexa Zydis 15 mg Bid (which is more that what patient was taking to admission and can also be a cause of worsening of condition).   Patient was started on Klonopin 0.25 mg Bid    Medication recommendations: Decrease Zyprexa Zydis to 7.5 mg Q hs IM/PO Cogentin 1 mg Q hs IM/PO Continue Klonopin 0.25 mg Bid Continue Prozac 40 mg daily  If patient continues to refuse medications may want to get a IVC and  second opinion for forced medications.     May also want to do a repeat of urinalysis and culture since patient has not been taking her Keflex .  If she is unwilling to urinate in a cup; RN can place specimen hat in toilet to catch urine to collect specimen with.

## 2017-07-15 NOTE — ED Notes (Signed)
Sister-in-law also visiting w/pt - encouraging pt to drink water.

## 2017-07-15 NOTE — ED Notes (Signed)
Spouse and granddaughter visiting w/pt. Spouse states brought pt to ED d/t she was stressed from her employer, [employer], changing her work hours and she was not sleeping so she missed her meds. Spouse states she is on the correct meds and does not wish for them to be changed. States pt is energetic at home. Aware pt is incont of urine and sitting in same position on bed.

## 2017-07-15 NOTE — ED Notes (Signed)
Pt's spouse called and wanted to speak w/pt. Pt would not leave her room and talk w/him on the phone. Aware spouse advised he is coming to visit w/her at 1230. Sitter encouraged pt to ambulate to bathroom or use bedside commode. Pt states she is too scared.

## 2017-07-15 NOTE — ED Notes (Signed)
Family assisting pt w/ambulating in [place] and encouraging her to obtain urine specimen.

## 2017-07-15 NOTE — ED Notes (Signed)
Sitter encouraging pt to eat breakfast. Attempted to feed pt - refusing.

## 2017-07-15 NOTE — ED Notes (Signed)
Patient denies pain and is resting comfortably.  

## 2017-07-15 NOTE — ED Notes (Signed)
Pt sitting on side of bed. After talking with pt for some time, pt took pills after placing them in her mouth. Pt walked to restroom and depends changed. Pt did not urinate in toilet.

## 2017-07-15 NOTE — ED Notes (Signed)
Encouraging pt to eat breakfast x 3 and drink fluids - pt refusing. Attempted to feed pt - refusing. Encouraging pt to ambulate to bathroom so may obtain urine specimen for urine culture - pt refusing. Pt speaks in very soft voice. Pt sitting on side of bed in dark room. Refuses for tv and lights to be turned on.

## 2017-07-16 NOTE — ED Notes (Signed)
Encouraging pt to eat breakfast, drink water, and ambulate to bathroom - pt refuses. States she does not want to. TV turned off as pt requested. Pt noted to be speaking in whispering voice. Pt sitting on bed - refuses to lie down. States she slept a little last night - off and on.

## 2017-07-16 NOTE — ED Notes (Signed)
Breakfast tray ordered 

## 2017-07-16 NOTE — ED Notes (Signed)
In to see pt. Pt sitting on side of bed staring at the wall. Sat down with pt and ask her about eating. "I don't want anything". Ask pt about food that was on her table "I guess its been there since dinner, but I don't want it". Removed three cups of old drink/water. Pt states that she would drink a little sprite. Pt took several drinks of sprite. Checked pts depends, scrubs and bed and all are dry. Pt did not like this RN getting near her. Ask pt to walk around the unit with me and she states "I don't want to" "I just want to go home". Tried to get pt to eat some applesauce, crackers and cookies. Pt refused all. Will continue to monitor. Pt continues to sit on the side of the bed staring at the wall and out the door. TV is on, but pt not watching.

## 2017-07-16 NOTE — ED Notes (Addendum)
Pt took meds after much encouragement. Pt drinking Sprite and eating graham crackers. Pt initially stated she already took them this am. Advised pt last meds she took was last pm.

## 2017-07-16 NOTE — ED Notes (Signed)
Pt assisted to lying down on bed by NT. Pt then noted to be sleeping - eyes closed, respirations even, unlabored.

## 2017-07-16 NOTE — ED Notes (Signed)
Patient ate 10% of her Lunch and drink 240cc of her drink.

## 2017-07-16 NOTE — ED Notes (Addendum)
Pt's son and 2 other family members visiting w/pt. They woke pt.

## 2017-07-16 NOTE — ED Notes (Signed)
NT assisted pt to bathroom - attempting to obtain urine specimen.

## 2017-07-16 NOTE — ED Notes (Signed)
Pt sitting on bed. Ate portion of her lunch w/much encouragement.

## 2017-07-16 NOTE — ED Notes (Signed)
Regular Diet was ordered for Dinner. 

## 2017-07-16 NOTE — ED Notes (Signed)
Pt still sitting up, but has put her back against the back of bed and is more relaxed with eyes closed. Will continue to monitor.

## 2017-07-16 NOTE — ED Notes (Signed)
Pt's son has now left. Unable to encourage pt to eat lunch. Pt sitting on bed. RN encouraged pt to eat - pt continues to refuse.

## 2017-07-17 LAB — I-STAT CHEM 8, ED
BUN: 55 mg/dL — ABNORMAL HIGH (ref 6–20)
Calcium, Ion: 1.17 mmol/L (ref 1.15–1.40)
Chloride: 109 mmol/L (ref 101–111)
Creatinine, Ser: 1.2 mg/dL — ABNORMAL HIGH (ref 0.44–1.00)
Glucose, Bld: 159 mg/dL — ABNORMAL HIGH (ref 65–99)
HCT: 44 % (ref 36.0–46.0)
Hemoglobin: 15 g/dL (ref 12.0–15.0)
Potassium: 3.3 mmol/L — ABNORMAL LOW (ref 3.5–5.1)
Sodium: 152 mmol/L — ABNORMAL HIGH (ref 135–145)
TCO2: 33 mmol/L — ABNORMAL HIGH (ref 22–32)

## 2017-07-17 NOTE — ED Notes (Signed)
Breakfast tray ordered 

## 2017-07-17 NOTE — ED Notes (Signed)
RN attempted to contact [facility] and regarding patient transfer due IVC-RN will attempt to make contact at a later time-Monique,RN

## 2017-07-17 NOTE — ED Triage Notes (Signed)
PT sitting  On side of bed  But is non verbal

## 2017-07-17 NOTE — ED Triage Notes (Signed)
PT refused to eat or drink lunch. Pt has several bottles of water on Bedside table. Pt is also non verbal . Pt sitting on side of bed not moving.

## 2017-07-17 NOTE — ED Notes (Signed)
RN [facility] with Sara,SW [facility] As of now [facility]; Confirmed [facility]; RN to wait for [facility]-Monique,RN

## 2017-07-17 NOTE — ED Triage Notes (Signed)
Pt [facility] . Pt asked if she would sign the voluntary  Transfer paper. Pt refused. This id the first conversation Pt has had with staff.

## 2017-07-17 NOTE — Progress Notes (Signed)
[facility]. Amy PellegriniShuvan Rankin, NP is the accepting provider. Dr. Altamese Carolinaainville is the attending provider.   Call report to [phone]. Gabriel RungMonique, RN @ [facility] notified.    Pt is IVC and will need to be transported by [employer] enforcement.  [facility]30[facility], however she may need to be transported by LE in the morning as she lives in [place] and will need to specifically be transported by the sheriff.   Wells GuilesSarah Takai Chiaramonte, LCSW, LCAS Disposition CSW [facility]/TTS [phone]

## 2017-07-17 NOTE — ED Triage Notes (Signed)
PT standing in BR with a basin filled with toiletries ,Pt had voided on floor and was asking for more pants. Pt refused a shower and Pt refused to leave BR. Security call to assist with returning PT to room .

## 2017-07-17 NOTE — ED Notes (Signed)
RN confirmed with [employer] officer that due to patient home address being in [place], Pt will have to be transported to

## 2017-07-17 NOTE — ED Notes (Signed)
Patient is alert and able to have converstation with RN; pt states she did not understand what is was being asked to sign today; pt advised that we are awaiting for IVC paper work-Monique,RN

## 2017-07-17 NOTE — ED Notes (Signed)
Pt up walking around in room. Pt out to restroom requesting to take a shower. Ask pt to wait until morning due to the medications she had. Pt wanted to wash up, pt took washcloths, and tolietries to restroom while sitter monitored pt. Pt then went back to bed and went to sleep. Sitter at bedside.

## 2017-07-17 NOTE — ED Triage Notes (Signed)
PT refused to eat or drink this AM

## 2017-07-17 NOTE — ED Notes (Signed)
RN called Dispatch to confirm it [employer] or Sheriff's office has to transport patient due to IVC and patient having [place] address; Sheriff's department will have to transport patient; [facility]-Monique,RN

## 2017-07-17 NOTE — ED Notes (Signed)
Pt took med with out being coached; pt was talkative with RN; pt asked questions about family visiting; pt was able to recall that she came in on 07/10/17 and had been here for about 7 days; Pt is very pleasant; Pt offered snack but states she does not want any at this time but that she ate a few out of the pack ealier-Monique,RN

## 2017-07-17 NOTE — ED Notes (Signed)
IVC papers served by [employer] office;[facility], original placed in red folder in POD F med room; Copy placed in Medical records; 3 copies remain on patient's chart-Monique,RN

## 2017-07-18 ENCOUNTER — Inpatient Hospital Stay (HOSPITAL_COMMUNITY)
Admission: AD | Admit: 2017-07-18 | Discharge: 2017-07-19 | DRG: 885 | Disposition: A | Discharge status: Other Acute Inpatient Hospital | Payer: BLUE CROSS/BLUE SHIELD | Source: Intra-hospital | Attending: Internal Medicine | Admitting: Internal Medicine

## 2017-07-18 ENCOUNTER — Other Ambulatory Visit: Payer: Self-pay

## 2017-07-18 ENCOUNTER — Encounter (HOSPITAL_COMMUNITY): Payer: Self-pay

## 2017-07-18 DIAGNOSIS — I10 Essential (primary) hypertension: Secondary | ICD-10-CM | POA: Diagnosis not present

## 2017-07-18 DIAGNOSIS — R627 Adult failure to thrive: Secondary | ICD-10-CM | POA: Diagnosis present

## 2017-07-18 DIAGNOSIS — G4700 Insomnia, unspecified: Secondary | ICD-10-CM | POA: Diagnosis not present

## 2017-07-18 DIAGNOSIS — K0889 Other specified disorders of teeth and supporting structures: Secondary | ICD-10-CM | POA: Diagnosis not present

## 2017-07-18 DIAGNOSIS — F329 Major depressive disorder, single episode, unspecified: Secondary | ICD-10-CM | POA: Diagnosis not present

## 2017-07-18 DIAGNOSIS — N179 Acute kidney failure, unspecified: Secondary | ICD-10-CM | POA: Diagnosis not present

## 2017-07-18 DIAGNOSIS — F250 Schizoaffective disorder, bipolar type: Secondary | ICD-10-CM | POA: Diagnosis not present

## 2017-07-18 DIAGNOSIS — B965 Pseudomonas (aeruginosa) (mallei) (pseudomallei) as the cause of diseases classified elsewhere: Secondary | ICD-10-CM | POA: Diagnosis present

## 2017-07-18 DIAGNOSIS — N390 Urinary tract infection, site not specified: Secondary | ICD-10-CM | POA: Diagnosis not present

## 2017-07-18 DIAGNOSIS — Z888 Allergy status to other drugs, medicaments and biological substances status: Secondary | ICD-10-CM | POA: Diagnosis not present

## 2017-07-18 DIAGNOSIS — Z882 Allergy status to sulfonamides status: Secondary | ICD-10-CM | POA: Diagnosis not present

## 2017-07-18 DIAGNOSIS — Z7982 Long term (current) use of aspirin: Secondary | ICD-10-CM | POA: Diagnosis not present

## 2017-07-18 DIAGNOSIS — F200 Paranoid schizophrenia: Secondary | ICD-10-CM | POA: Diagnosis not present

## 2017-07-18 DIAGNOSIS — M25519 Pain in unspecified shoulder: Secondary | ICD-10-CM | POA: Diagnosis not present

## 2017-07-18 DIAGNOSIS — E870 Hyperosmolality and hypernatremia: Secondary | ICD-10-CM | POA: Diagnosis not present

## 2017-07-18 DIAGNOSIS — Z79899 Other long term (current) drug therapy: Secondary | ICD-10-CM | POA: Diagnosis not present

## 2017-07-18 DIAGNOSIS — F202 Catatonic schizophrenia: Secondary | ICD-10-CM | POA: Diagnosis not present

## 2017-07-18 DIAGNOSIS — F209 Schizophrenia, unspecified: Principal | ICD-10-CM | POA: Diagnosis present

## 2017-07-18 DIAGNOSIS — Z818 Family history of other mental and behavioral disorders: Secondary | ICD-10-CM | POA: Diagnosis not present

## 2017-07-18 DIAGNOSIS — E869 Volume depletion, unspecified: Secondary | ICD-10-CM | POA: Diagnosis present

## 2017-07-18 DIAGNOSIS — N3000 Acute cystitis without hematuria: Secondary | ICD-10-CM | POA: Diagnosis not present

## 2017-07-18 DIAGNOSIS — E860 Dehydration: Secondary | ICD-10-CM | POA: Diagnosis not present

## 2017-07-18 DIAGNOSIS — F5089 Other specified eating disorder: Secondary | ICD-10-CM | POA: Diagnosis not present

## 2017-07-18 DIAGNOSIS — Z8249 Family history of ischemic heart disease and other diseases of the circulatory system: Secondary | ICD-10-CM | POA: Diagnosis not present

## 2017-07-18 DIAGNOSIS — M542 Cervicalgia: Secondary | ICD-10-CM | POA: Diagnosis not present

## 2017-07-18 DIAGNOSIS — Z87442 Personal history of urinary calculi: Secondary | ICD-10-CM | POA: Diagnosis not present

## 2017-07-18 DIAGNOSIS — E876 Hypokalemia: Secondary | ICD-10-CM | POA: Diagnosis present

## 2017-07-18 DIAGNOSIS — Z046 Encounter for general psychiatric examination, requested by authority: Secondary | ICD-10-CM | POA: Diagnosis not present

## 2017-07-18 LAB — URINE CULTURE: Culture: >=100000 — AB

## 2017-07-18 MED ORDER — TRAZODONE HCL 50 MG PO TABS: 50.0000 mg | ORAL_TABLET | Freq: Every evening | ORAL | Status: DC | PRN

## 2017-07-18 MED ORDER — SODIUM CHLORIDE 0.9 % IV BOLUS
1000.0000 mL | Freq: Once | INTRAVENOUS | Status: AC
  Administered 2017-07-18: 1000 mL via INTRAVENOUS

## 2017-07-18 MED ORDER — HALOPERIDOL 5 MG PO TABS: 5.0000 mg | ORAL_TABLET | Freq: Four times a day (QID) | ORAL | Status: DC | PRN

## 2017-07-18 MED ORDER — ACETAMINOPHEN 325 MG PO TABS: 650.0000 mg | ORAL_TABLET | Freq: Four times a day (QID) | ORAL | Status: DC | PRN

## 2017-07-18 MED ORDER — MAGNESIUM HYDROXIDE 400 MG/5ML PO SUSP: 30.0000 mL | Freq: Every day | ORAL | Status: DC | PRN

## 2017-07-18 MED ORDER — ALUM & MAG HYDROXIDE-SIMETH 200-200-20 MG/5ML PO SUSP: 30.0000 mL | ORAL | Status: DC | PRN

## 2017-07-18 MED ORDER — HYDROXYZINE HCL 25 MG PO TABS: 25.0000 mg | ORAL_TABLET | Freq: Three times a day (TID) | ORAL | Status: DC | PRN

## 2017-07-18 MED ORDER — HALOPERIDOL LACTATE 5 MG/ML IJ SOLN: 5.0000 mg | Freq: Four times a day (QID) | INTRAMUSCULAR | Status: DC | PRN

## 2017-07-18 MED ORDER — CIPROFLOXACIN HCL 500 MG PO TABS
750.0000 mg | ORAL_TABLET | Freq: Two times a day (BID) | ORAL | Status: DC
Start: 2017-07-18 — End: 2017-07-18
  Administered 2017-07-18: 750 mg via ORAL
  Filled 2017-07-18: qty 2

## 2017-07-18 NOTE — Progress Notes (Signed)
Amy Casey is a 57 year old female being admitted involuntarily to 503-1 from [facility].  She came to the ED for psychosis, paranoia and confusion.  She was medically monitored for UTI and low potassium.  [facility], she was alert and oriented to name and place.  She was confused as to why she is in behavioral health.  She denies SI/HI but does admit to hearing things (comes and goes) and generally feeling like people are out to get me.  She has not been drinking and she stated that "I haven't been drinking because I am dribbling on myself and I go to the bathroom all the time."  She confirms that she doesn't want to drink so she won't have to go to the bathroom.  Judgement/insight poor.  She was very unsteady on the feet and was orthostatic upon standing.  She almost fell x 3 in admission room.  Oriented her to the unit.  Admission paperwork completed and signed.  Belongings searched and no belongings needing to be locked up, no contraband found.  Skin assessment completed and no skin issues noted.  Q 15 minute checks initiated for safety.  We will continue to monitor the progress towards her goals.

## 2017-07-18 NOTE — Progress Notes (Signed)
Amy Casey continues to not get out of bed, refusing to drink or eat.  Staffed with Donell SievertSpencer Simon PA about not eating/drinking, elevated BUN/Creatinine, elevated sodium, decreased potassium and decreased magnesium.  Assessed orthostatic vital signs and was BP dropped and pulse was elevated upon standing.  PA wanted her transported to ED for evaluation and fluids.  She will be transported to ED via EMS and EMS was called.

## 2017-07-18 NOTE — Tx Team (Signed)
Initial Treatment Plan 07/18/2017 3:28 PM Amy QualeBeverly Ponti JXB:[id number]    PATIENT STRESSORS: Health problems Medication change or noncompliance   PATIENT STRENGTHS: Motivation for treatment/growth Supportive family/friends   PATIENT IDENTIFIED PROBLEMS: Psychosis  "I need to get help with my bills"  "Feel better"                 DISCHARGE CRITERIA:  Improved stabilization in mood, thinking, and/or behavior Verbal commitment to aftercare and medication compliance  PRELIMINARY DISCHARGE PLAN: Outpatient therapy Medication management  PATIENT/FAMILY INVOLVEMENT: This treatment plan has been presented to and reviewed with the patient, Amy Casey.  The patient and family have been given the opportunity to ask questions and make suggestions.  Levin BaconHeather V Caitland Porchia, RN 07/18/2017, 3:28 PM

## 2017-07-18 NOTE — Progress Notes (Signed)
Pt was able to walk on the unit but was unstable and stumbled a few times.  Staff with Miki KinsLinsey AC and it was agreed that she needs to be placed on 1:1 for fall risk.  We will continue to monitor the progress towards her goals.

## 2017-07-18 NOTE — Significant Event (Cosign Needed)
Alerted by NS, of patient's decreased mentation, over all weakness, failure to thrive, and notable previous electrolyte abn in setting of PSA UTI. Patient is A & 0 x 3, malnourished appearing, weak. Skin turgor decreased, CN 2 - 12 grossly intact. V/s reflective of ortho stasis, Cardio - no audible murmur, rubs or ectopy. A/P ortho stasis/intravascular volume depletion, hypo-kalemia, AKD, UTI. * [facility] for [facility]F resuscitation, check CBC, BMP and mg

## 2017-07-18 NOTE — ED Notes (Signed)
Pt's daughter called - [facility] - inquiring when pt will be transported - advised soon.

## 2017-07-18 NOTE — ED Notes (Addendum)
Pt remains sitting on bed. Dr Phineas RealMabe aware of pt's VS's - incl HR 103. Advised OK [facility]. Miki KinsLinsey, AC, [facility], aware.

## 2017-07-18 NOTE — [facility] Group Notes (Signed)
[facility] Group Notes:  (Nursing/MHT/Case Management/Adjunct)  Date:  07/18/2017  Time:  4:36 PM  Type of Therapy:  Psychoeducational Skills  Participation Level:  Did Not Attend    Amy Critchleylizabeth O Mashayla Lavin 07/18/2017, 4:36 PM

## 2017-07-18 NOTE — Progress Notes (Signed)
Pt has been lying in bed since her admission to the unit.  She is still refusing anything po even at the encouragement of staff.  When staff asks her questions, she mumbles answers which are difficult to understand.  She has mild to moderate confusion.  She continues on 1:1 for safety as she is a high fall risk.  Sitter is at pt's bedside.  Pt is safe at this time, and will continue to encourage fluids.

## 2017-07-18 NOTE — ED Notes (Signed)
[facility] - was advised by secretary nurses are performing Med Pass at this time then will be in meeting.

## 2017-07-18 NOTE — ED Notes (Signed)
Pt sitting on side of bed. Encouraging pt to eat breakfast - pt refusing - states "I don't want that. I'm not hungry".

## 2017-07-18 NOTE — ED Provider Notes (Signed)
9:22 AM  Urine culture grew pseudomonas- no sensitivities yet.  However will change to cipro to cover for pseudomonas.    Pt did have some tachycardia that was sinus tach on EKG and improved after IV fluids.  Chart reviewed and repeat labs reviewed as well.  Pt has a bed at [facility] and will be transferred there   Phillis HaggisMabe, Revere Maahs L, MD 07/18/17 1332

## 2017-07-18 NOTE — ED Notes (Signed)
EKG given to Dr Phineas RealMabe.

## 2017-07-18 NOTE — ED Notes (Signed)
Dr Phineas RealMabe aware of pt's Urine Culture results - antibiotic changed from Keflex to Cipro. Also aware pt's HR 153-155. Order received for EKG and IV NS bolus. [name], RN, [facility] - aware of delay w/transporting pt.

## 2017-07-18 NOTE — ED Notes (Signed)
Bed: [id number] Expected date:  Expected time:  Means of arrival:  [facility]

## 2017-07-18 NOTE — ED Notes (Addendum)
Pt continues to refuse to eat/drink. Was able to get pt to drink 1/2 cup of water w/meds w/much encouragement. Encouraged pt to shower x 2 - refuses.

## 2017-07-18 NOTE — ED Notes (Signed)
Offered for pt to call her spouse to [facility] - declined.

## 2017-07-18 NOTE — Progress Notes (Signed)
Patient denies SI, HI and AVH.  Patient denies any paranoid thoughts or delusional thinking. Patient had no incidents of behavioral dyscontrol.   Assess patient for safety, offer medications as prescribed, engage patient in 1:1 staff talks.   Patient able to contract for safety, continue to monitor as planned.

## 2017-07-18 NOTE — [facility] Group Notes (Signed)
Pt did not attend group this evening. Pt stayed in bed instead.  

## 2017-07-18 NOTE — ED Notes (Addendum)
[facility] reports pt has not wanted to eat or drink x7 hours. Concern for possible UTI, EMS reports foul urine smell. Pt ambulates well with 1 person assist.   EMS VS: 152/94, hr 94, RR 22, o2 94% RA

## 2017-07-19 ENCOUNTER — Encounter (HOSPITAL_COMMUNITY): Payer: Self-pay | Admitting: Emergency Medicine

## 2017-07-19 ENCOUNTER — Encounter (HOSPITAL_COMMUNITY): Payer: Self-pay

## 2017-07-19 ENCOUNTER — Inpatient Hospital Stay (HOSPITAL_COMMUNITY)
Admission: EM | Admit: 2017-07-19 | Discharge: 2017-07-24 | DRG: 641 | Disposition: A | Discharge status: Other Acute Inpatient Hospital | Payer: BLUE CROSS/BLUE SHIELD | Source: Intra-hospital | Attending: Internal Medicine | Admitting: Internal Medicine

## 2017-07-19 ENCOUNTER — Telehealth: Payer: Self-pay | Admitting: Emergency Medicine

## 2017-07-19 DIAGNOSIS — N179 Acute kidney failure, unspecified: Secondary | ICD-10-CM | POA: Diagnosis not present

## 2017-07-19 DIAGNOSIS — Z5329 Procedure and treatment not carried out because of patient's decision for other reasons: Secondary | ICD-10-CM

## 2017-07-19 DIAGNOSIS — E860 Dehydration: Secondary | ICD-10-CM | POA: Diagnosis not present

## 2017-07-19 DIAGNOSIS — K0889 Other specified disorders of teeth and supporting structures: Secondary | ICD-10-CM | POA: Diagnosis not present

## 2017-07-19 DIAGNOSIS — R630 Anorexia: Secondary | ICD-10-CM | POA: Diagnosis present

## 2017-07-19 DIAGNOSIS — Z5320 Procedure and treatment not carried out because of patient's decision for unspecified reasons: Secondary | ICD-10-CM

## 2017-07-19 DIAGNOSIS — M542 Cervicalgia: Secondary | ICD-10-CM | POA: Diagnosis not present

## 2017-07-19 DIAGNOSIS — M25519 Pain in unspecified shoulder: Secondary | ICD-10-CM | POA: Diagnosis not present

## 2017-07-19 DIAGNOSIS — Z79899 Other long term (current) drug therapy: Secondary | ICD-10-CM

## 2017-07-19 DIAGNOSIS — E876 Hypokalemia: Secondary | ICD-10-CM | POA: Diagnosis not present

## 2017-07-19 DIAGNOSIS — R5383 Other fatigue: Secondary | ICD-10-CM | POA: Diagnosis present

## 2017-07-19 DIAGNOSIS — N3000 Acute cystitis without hematuria: Secondary | ICD-10-CM | POA: Diagnosis not present

## 2017-07-19 DIAGNOSIS — Z9119 Patient's noncompliance with other medical treatment and regimen: Secondary | ICD-10-CM | POA: Diagnosis not present

## 2017-07-19 DIAGNOSIS — F329 Major depressive disorder, single episode, unspecified: Secondary | ICD-10-CM | POA: Diagnosis present

## 2017-07-19 DIAGNOSIS — N390 Urinary tract infection, site not specified: Secondary | ICD-10-CM | POA: Diagnosis not present

## 2017-07-19 DIAGNOSIS — Z046 Encounter for general psychiatric examination, requested by authority: Secondary | ICD-10-CM | POA: Diagnosis not present

## 2017-07-19 DIAGNOSIS — Z888 Allergy status to other drugs, medicaments and biological substances status: Secondary | ICD-10-CM

## 2017-07-19 DIAGNOSIS — Z7982 Long term (current) use of aspirin: Secondary | ICD-10-CM | POA: Diagnosis not present

## 2017-07-19 DIAGNOSIS — Z87442 Personal history of urinary calculi: Secondary | ICD-10-CM | POA: Diagnosis not present

## 2017-07-19 DIAGNOSIS — Z8249 Family history of ischemic heart disease and other diseases of the circulatory system: Secondary | ICD-10-CM

## 2017-07-19 DIAGNOSIS — G4700 Insomnia, unspecified: Secondary | ICD-10-CM | POA: Diagnosis not present

## 2017-07-19 DIAGNOSIS — F209 Schizophrenia, unspecified: Secondary | ICD-10-CM | POA: Diagnosis present

## 2017-07-19 DIAGNOSIS — Z882 Allergy status to sulfonamides status: Secondary | ICD-10-CM | POA: Diagnosis not present

## 2017-07-19 DIAGNOSIS — Z915 Personal history of self-harm: Secondary | ICD-10-CM | POA: Diagnosis not present

## 2017-07-19 DIAGNOSIS — F200 Paranoid schizophrenia: Secondary | ICD-10-CM | POA: Diagnosis not present

## 2017-07-19 DIAGNOSIS — Z9114 Patient's other noncompliance with medication regimen: Secondary | ICD-10-CM | POA: Diagnosis not present

## 2017-07-19 DIAGNOSIS — F202 Catatonic schizophrenia: Secondary | ICD-10-CM | POA: Diagnosis not present

## 2017-07-19 DIAGNOSIS — F250 Schizoaffective disorder, bipolar type: Secondary | ICD-10-CM | POA: Diagnosis not present

## 2017-07-19 DIAGNOSIS — Z818 Family history of other mental and behavioral disorders: Secondary | ICD-10-CM | POA: Diagnosis not present

## 2017-07-19 DIAGNOSIS — E870 Hyperosmolality and hypernatremia: Principal | ICD-10-CM | POA: Diagnosis present

## 2017-07-19 DIAGNOSIS — I10 Essential (primary) hypertension: Secondary | ICD-10-CM | POA: Diagnosis not present

## 2017-07-19 DIAGNOSIS — F5089 Other specified eating disorder: Secondary | ICD-10-CM | POA: Diagnosis not present

## 2017-07-19 DIAGNOSIS — Z62810 Personal history of physical and sexual abuse in childhood: Secondary | ICD-10-CM | POA: Diagnosis not present

## 2017-07-19 LAB — TSH: TSH: 1.678 u[IU]/mL (ref 0.350–4.500)

## 2017-07-19 LAB — BASIC METABOLIC PANEL
Anion gap: 12 (ref 5–15)
BUN: 19 mg/dL (ref 6–20)
CO2: 22 mmol/L (ref 22–32)
Calcium: 8.5 mg/dL — ABNORMAL LOW (ref 8.9–10.3)
Chloride: 113 mmol/L — ABNORMAL HIGH (ref 101–111)
Creatinine, Ser: 0.71 mg/dL (ref 0.44–1.00)
GFR calc Af Amer: >60 mL/min (ref >60)
GFR calc non Af Amer: >60 mL/min (ref >60)
Glucose, Bld: 91 mg/dL (ref 65–99)
Potassium: 3.7 mmol/L (ref 3.5–5.1)
Sodium: 147 mmol/L — ABNORMAL HIGH (ref 135–145)

## 2017-07-19 LAB — CBC
HCT: 39.1 % (ref 36.0–46.0)
Hemoglobin: 12.3 g/dL (ref 12.0–15.0)
MCH: 28.5 pg (ref 26.0–34.0)
MCHC: 31.5 g/dL (ref 30.0–36.0)
MCV: 90.7 fL (ref 78.0–100.0)
Platelets: 289 10*3/uL (ref 150–400)
RBC: 4.31 MIL/uL (ref 3.87–5.11)
RDW: 12.7 % (ref 11.5–15.5)
WBC: 6.7 10*3/uL (ref 4.0–10.5)

## 2017-07-19 LAB — URINALYSIS, ROUTINE W REFLEX MICROSCOPIC
Bilirubin Urine: NEGATIVE
Glucose, UA: NEGATIVE mg/dL
Ketones, ur: 20 mg/dL — AB
Nitrite: NEGATIVE
Protein, ur: NEGATIVE mg/dL
Specific Gravity, Urine: 1.02 (ref 1.005–1.030)
pH: 5 (ref 5.0–8.0)

## 2017-07-19 LAB — CBC WITH DIFFERENTIAL/PLATELET
Basophils Absolute: 0 10*3/uL (ref 0.0–0.1)
Basophils Relative: 0 %
Eosinophils Absolute: 0.2 10*3/uL (ref 0.0–0.7)
Eosinophils Relative: 4 %
HCT: 41.3 % (ref 36.0–46.0)
Hemoglobin: 13 g/dL (ref 12.0–15.0)
Lymphocytes Relative: 30 %
Lymphs Abs: 1.9 10*3/uL (ref 0.7–4.0)
MCH: 28.8 pg (ref 26.0–34.0)
MCHC: 31.5 g/dL (ref 30.0–36.0)
MCV: 91.6 fL (ref 78.0–100.0)
Monocytes Absolute: 0.4 10*3/uL (ref 0.1–1.0)
Monocytes Relative: 6 %
Neutro Abs: 3.8 10*3/uL (ref 1.7–7.7)
Neutrophils Relative %: 60 %
Platelets: 334 10*3/uL (ref 150–400)
RBC: 4.51 MIL/uL (ref 3.87–5.11)
RDW: 13.1 % (ref 11.5–15.5)
WBC: 6.4 10*3/uL (ref 4.0–10.5)

## 2017-07-19 LAB — OSMOLALITY, URINE: Osmolality, Ur: 853 mOsm/kg (ref 300–900)

## 2017-07-19 LAB — LIPID PANEL
Cholesterol: 128 mg/dL (ref 0–200)
HDL: 37 mg/dL — ABNORMAL LOW (ref >40)
LDL Cholesterol: 77 mg/dL (ref 0–99)
Total CHOL/HDL Ratio: 3.5 RATIO
Triglycerides: 68 mg/dL (ref <150)
VLDL: 14 mg/dL (ref 0–40)

## 2017-07-19 LAB — MRSA PCR SCREENING: MRSA by PCR: POSITIVE — AB

## 2017-07-19 LAB — COMPREHENSIVE METABOLIC PANEL
ALT: 35 U/L (ref 14–54)
AST: 31 U/L (ref 15–41)
Albumin: 3.7 g/dL (ref 3.5–5.0)
Alkaline Phosphatase: 66 U/L (ref 38–126)
Anion gap: 12 (ref 5–15)
BUN: 40 mg/dL — ABNORMAL HIGH (ref 6–20)
CO2: 28 mmol/L (ref 22–32)
Calcium: 9.4 mg/dL (ref 8.9–10.3)
Chloride: 115 mmol/L — ABNORMAL HIGH (ref 101–111)
Creatinine, Ser: 0.94 mg/dL (ref 0.44–1.00)
GFR calc Af Amer: >60 mL/min (ref >60)
GFR calc non Af Amer: >60 mL/min (ref >60)
Glucose, Bld: 105 mg/dL — ABNORMAL HIGH (ref 65–99)
Potassium: 3.2 mmol/L — ABNORMAL LOW (ref 3.5–5.1)
Sodium: 155 mmol/L — ABNORMAL HIGH (ref 135–145)
Total Bilirubin: 0.6 mg/dL (ref 0.3–1.2)
Total Protein: 7.6 g/dL (ref 6.5–8.1)

## 2017-07-19 LAB — MAGNESIUM
Magnesium: 2.2 mg/dL (ref 1.7–2.4)
Magnesium: 1.6 mg/dL — ABNORMAL LOW (ref 1.7–2.4)

## 2017-07-19 LAB — PHOSPHORUS: Phosphorus: 3.4 mg/dL (ref 2.5–4.6)

## 2017-07-19 LAB — TROPONIN I: Troponin I: <0.03 ng/mL (ref <0.03)

## 2017-07-19 LAB — OSMOLALITY: Osmolality: 329 mOsm/kg (ref 275–295)

## 2017-07-19 MED ORDER — SODIUM CHLORIDE 0.9 % IV BOLUS
3000.0000 mL | Freq: Once | INTRAVENOUS | Status: AC
  Administered 2017-07-19: 3000 mL via INTRAVENOUS

## 2017-07-19 MED ORDER — SODIUM CHLORIDE 0.9 % IV SOLN
1.0000 g | Freq: Three times a day (TID) | INTRAVENOUS | Status: DC
  Administered 2017-07-19: 1 g via INTRAVENOUS
  Filled 2017-07-19 (×2): qty 1

## 2017-07-19 MED ORDER — SODIUM CHLORIDE 0.9 % IV SOLN
1.0000 g | INTRAVENOUS | Status: DC
  Administered 2017-07-19: 1 g via INTRAVENOUS
  Filled 2017-07-19: qty 10

## 2017-07-19 MED ORDER — HALOPERIDOL LACTATE 5 MG/ML IJ SOLN
INTRAMUSCULAR | Status: AC
  Administered 2017-07-19: 2 mg
  Filled 2017-07-19: qty 1

## 2017-07-19 MED ORDER — ENOXAPARIN SODIUM 40 MG/0.4ML ~~LOC~~ SOLN
40.0000 mg | SUBCUTANEOUS | Status: DC
  Filled 2017-07-19 (×2): qty 0.4

## 2017-07-19 MED ORDER — LIP MEDEX EX OINT
TOPICAL_OINTMENT | CUTANEOUS | Status: AC
  Administered 2017-07-19: 1
  Filled 2017-07-19: qty 7

## 2017-07-19 MED ORDER — LORAZEPAM 0.5 MG PO TABS
0.5000 mg | ORAL_TABLET | Freq: Every day | ORAL | Status: DC | PRN
  Administered 2017-07-23: 0.5 mg via ORAL
  Filled 2017-07-19: qty 1

## 2017-07-19 MED ORDER — ACETAMINOPHEN 650 MG RE SUPP: 650.0000 mg | Freq: Four times a day (QID) | RECTAL | Status: DC | PRN

## 2017-07-19 MED ORDER — CIPROFLOXACIN HCL 500 MG PO TABS: 500.0000 mg | ORAL_TABLET | Freq: Two times a day (BID) | ORAL | Status: DC

## 2017-07-19 MED ORDER — MUPIROCIN 2 % EX OINT
1.0000 "application " | TOPICAL_OINTMENT | Freq: Two times a day (BID) | CUTANEOUS | Status: DC
  Administered 2017-07-20 – 2017-07-24 (×4): 1 via NASAL
  Filled 2017-07-19: qty 22

## 2017-07-19 MED ORDER — HALOPERIDOL LACTATE 5 MG/ML IJ SOLN
1.0000 mg | INTRAMUSCULAR | Status: AC | PRN
  Administered 2017-07-19 – 2017-07-20 (×2): 2 mg via INTRAVENOUS
  Filled 2017-07-19 (×2): qty 1

## 2017-07-19 MED ORDER — SODIUM CHLORIDE 0.9 % IV BOLUS
1000.0000 mL | Freq: Once | INTRAVENOUS | Status: AC
  Administered 2017-07-19: 1000 mL via INTRAVENOUS

## 2017-07-19 MED ORDER — BENZTROPINE MESYLATE 1 MG PO TABS
1.0000 mg | ORAL_TABLET | Freq: Every day | ORAL | Status: DC
  Administered 2017-07-21 – 2017-07-24 (×4): 1 mg via ORAL
  Filled 2017-07-19: qty 2
  Filled 2017-07-19 (×6): qty 1

## 2017-07-19 MED ORDER — AMLODIPINE BESYLATE 5 MG PO TABS
5.0000 mg | ORAL_TABLET | Freq: Every day | ORAL | Status: DC
  Administered 2017-07-21 – 2017-07-24 (×3): 5 mg via ORAL
  Filled 2017-07-19 (×5): qty 1

## 2017-07-19 MED ORDER — SODIUM CHLORIDE 0.45 % IV SOLN
INTRAVENOUS | Status: DC
  Administered 2017-07-19: 09:00:00 via INTRAVENOUS

## 2017-07-19 MED ORDER — HYDRALAZINE HCL 20 MG/ML IJ SOLN
10.0000 mg | INTRAMUSCULAR | Status: DC | PRN
  Administered 2017-07-19 – 2017-07-20 (×3): 20 mg via INTRAVENOUS
  Filled 2017-07-19 (×3): qty 1

## 2017-07-19 MED ORDER — ACETAMINOPHEN 325 MG PO TABS
650.0000 mg | ORAL_TABLET | Freq: Four times a day (QID) | ORAL | Status: DC | PRN
  Filled 2017-07-19: qty 2

## 2017-07-19 MED ORDER — METHOCARBAMOL 500 MG PO TABS
500.0000 mg | ORAL_TABLET | Freq: Four times a day (QID) | ORAL | Status: DC
  Administered 2017-07-20 – 2017-07-24 (×5): 500 mg via ORAL
  Filled 2017-07-19 (×8): qty 1

## 2017-07-19 MED ORDER — LACTATED RINGERS IV BOLUS
1000.0000 mL | Freq: Once | INTRAVENOUS | Status: AC
  Administered 2017-07-19: 1000 mL via INTRAVENOUS

## 2017-07-19 MED ORDER — ACETAMINOPHEN 325 MG PO TABS: 650.0000 mg | ORAL_TABLET | Freq: Four times a day (QID) | ORAL | Status: DC | PRN

## 2017-07-19 MED ORDER — DEXTROSE-NACL 5-0.2 % IV SOLN
INTRAVENOUS | Status: DC
  Administered 2017-07-19 – 2017-07-20 (×2): via INTRAVENOUS

## 2017-07-19 MED ORDER — FLUOXETINE HCL 20 MG PO CAPS
40.0000 mg | ORAL_CAPSULE | Freq: Every day | ORAL | Status: DC
  Administered 2017-07-21 – 2017-07-24 (×3): 40 mg via ORAL
  Filled 2017-07-19 (×5): qty 2

## 2017-07-19 MED ORDER — OLANZAPINE 5 MG PO TABS
15.0000 mg | ORAL_TABLET | Freq: Every day | ORAL | Status: DC
  Administered 2017-07-20 – 2017-07-23 (×4): 15 mg via ORAL
  Filled 2017-07-19 (×5): qty 1

## 2017-07-19 MED ORDER — CHLORHEXIDINE GLUCONATE CLOTH 2 % EX PADS
6.0000 | MEDICATED_PAD | Freq: Every day | CUTANEOUS | Status: DC
  Administered 2017-07-20 – 2017-07-24 (×3): 6 via TOPICAL

## 2017-07-19 MED ORDER — ASPIRIN EC 81 MG PO TBEC
81.0000 mg | DELAYED_RELEASE_TABLET | Freq: Every day | ORAL | Status: DC
  Administered 2017-07-21 – 2017-07-24 (×3): 81 mg via ORAL
  Filled 2017-07-19 (×4): qty 1

## 2017-07-19 MED ORDER — POTASSIUM CHLORIDE ER 10 MEQ PO TBCR
10.0000 meq | EXTENDED_RELEASE_TABLET | Freq: Every day | ORAL | Status: DC
  Filled 2017-07-19 (×3): qty 1

## 2017-07-19 NOTE — ED Notes (Signed)
Paged Amy Ivoryobert Schertz,MD. Patient refusing PO medications.

## 2017-07-19 NOTE — Telephone Encounter (Signed)
Post ED Visit - Positive Culture Follow-up  Culture report reviewed by antimicrobial stewardship pharmacist:  []  Enzo BiNathan Batchelder, Pharm.D. []  Celedonio MiyamotoJeremy Frens, Pharm.D., BCPS AQ-ID []  Garvin FilaMike Maccia, Pharm.D., BCPS []  Georgina PillionElizabeth Martin, [address]Ph[address] BCPS []  Lower BruleMinh Pham, [address]Ph[address]D., BCPS, AAHIVP []  Estella HuskMichelle Turner, Pharm.D., BCPS, AAHIVP []  Lysle Pearlachel Rumbarger, PharmD, BCPS []  Blake DivineShannon Parkey, PharmD []  Pollyann SamplesAndy Johnston, PharmD, BCPS Sharin MonsEmily SInclair PharmD  Positive urine culture Admitted to inpatient, no further patient follow-up is required at this time.  Berle MullMiller, Aqil Goetting 07/19/2017, 12:33 PM

## 2017-07-19 NOTE — ED Notes (Signed)
Patient refuses all patient care, sitter at bedside

## 2017-07-19 NOTE — ED Notes (Signed)
Pt walked to bathroom not steady on her feet  Plus assistance

## 2017-07-19 NOTE — ED Notes (Addendum)
Patient pulled IV out. Patient will not respond or follow directions. Patients IV fluids and antibiotics were all over floor. Patient got less then half of maxipime. Told charge nurse. He asked if a tech could help out. [facility] is short staffed techs today. No tech to help watch patient. Put in a order for a Recruitment consultantsafety sitter.

## 2017-07-19 NOTE — ED Notes (Signed)
Pt refused to have vital taken  

## 2017-07-19 NOTE — ED Provider Notes (Signed)
[facility]-EMERGENCY DEPT Provider Note   CSN: [id number] Arrival date & time: 07/18/17  2327     History   Chief Complaint Chief Complaint  Patient presents with  . Weakness    HPI Amy Casey is a 57 y.o. female.  HPI  Level 5 caveat -patient is a poor historian likely due to decompensation of her underlying psychiatry condition.  Patient is a 57 year old female with history of depression, hypertension, schizophrenia.  Patient was admitted to behavioral health few days ago and they are sending patient back to the ED for medical evaluation.  According to the [facility] team patient has been not eating or drinking properly and she has a known UTI.  Patient is noted to have a flat affect and she is not providing any meaningful history.  Patient states that she has not been eating or drinking because she does not feel like it.  She denies any abdominal pain, fevers, chills.  Patient denies any history of heavy alcohol use, liver disease or substance abuse.  It is unclear if patient was started on new medications by psychiatry team.   ED charting does indicate that patient had a pseudomonal infection in her urine and she was switched to Cipro -and I reviewed the susceptibilities and it seems like this was an appropriate change.  Past Medical History:  Diagnosis Date  . Depression    Dr. Mila Homer, [facility]; hospitalization 07/2010  . History of echocardiogram 2011   [facility]  . History of renal stone   . Hypertension   . Hypokalemia   . Obesity   . Schizophrenia (HCC)   . Wears glasses     Patient Active Problem List   Diagnosis Date Noted  . Schizophrenia (HCC) 07/18/2017  . High risk medication use 07/25/2016  . Involuntary commitment   . Paranoid schizophrenia (HCC) 06/16/2015  . Noncompliance 06/16/2015  . Altered awareness, transient   . Altered mental status 02/19/2014  . Essential hypertension, benign 02/10/2012  . Tooth decay 02/10/2012  .  Obesity 02/10/2012  . Impaired fasting blood sugar 02/10/2012    Past Surgical History:  Procedure Laterality Date  . WISDOM TOOTH EXTRACTION       OB History   None      Home Medications    Prior to Admission medications   Medication Sig Start Date End Date Taking? Authorizing Provider  amLODipine (NORVASC) 10 MG tablet Take 1 tablet (10 mg total) by mouth daily. Patient taking differently: Take 5 mg by mouth daily.  07/25/16   Tysinger, Kermit Balo, PA-C  aspirin EC 81 MG tablet Take 1 tablet (81 mg total) by mouth daily. 07/25/16   Tysinger, Kermit Balo, PA-C  benztropine (COGENTIN) 1 MG tablet Take 1 tablet (1 mg total) by mouth daily. Patient not taking: Reported on 07/11/2017 07/25/16   Tysinger, Kermit Balo, PA-C  FLUoxetine (PROZAC) 40 MG capsule Take 1 capsule (40 mg total) by mouth daily. 07/25/16   Tysinger, Kermit Balo, PA-C  LORazepam (ATIVAN) 0.5 MG tablet Take 0.5 mg by mouth daily as needed. 11/23/16   [provider]  methocarbamol (ROBAXIN) 500 MG tablet Take 1 tablet (500 mg total) by mouth 4 (four) times daily. 12/31/16   Fisher, Roselyn Bering, PA-C  OLANZapine (ZYPREXA) 15 MG tablet Take 15 mg by mouth at bedtime. 11/23/16   [provider]  potassium chloride (K-DUR) 10 MEQ tablet Take 1 tablet (10 mEq total) by mouth daily. 07/25/16   Tysinger, Kermit Balo, PA-C  Family History Family History  Problem Relation Age of Onset  . Diabetes Mother   . Kidney disease Mother        dialysis  . Heart disease Mother   . Cancer Maternal Uncle        lung  . Stroke Neg Hx   . Hypertension Neg Hx   . Hyperlipidemia Neg Hx     Social History Social History   Tobacco Use  . Smoking status: Never Smoker  . Smokeless tobacco: Never Used  Substance Use Topics  . Alcohol use: No  . Drug use: No     Allergies   Paliperidone; Sulfa antibiotics; and Lisinopril   Review of Systems Review of Systems  Unable to perform ROS: Psychiatric disorder     Physical  Exam Updated Vital Signs BP 139/88 (BP Location: Left Arm)   Pulse 95   Temp 98.6 F (37 C) (Oral)   Resp 16   Ht 5\' 7"  (1.702 m)   Wt 78.9 kg (174 lb)   SpO2 99%   BMI 27.25 kg/m   Physical Exam  Constitutional: She appears well-developed.  HENT:  Head: Atraumatic.  Eyes: Pupils are equal, round, and reactive to light. EOM are normal. Right eye discharge: .now.  Neck: Neck supple.  Cardiovascular: Normal rate.  Pulmonary/Chest: Effort normal.  Abdominal: Bowel sounds are normal. There is tenderness. There is no rebound and no guarding.  Suprapubic tenderness  Neurological: She is alert.  Skin: Skin is dry. Capillary refill takes 2 to 3 seconds.  Psychiatric:  Flat affect  Nursing note and vitals reviewed.    ED Treatments / Results  Labs (all labs ordered are listed, but only abnormal results are displayed) Labs Reviewed  COMPREHENSIVE METABOLIC PANEL - Abnormal; Notable for the following components:      Result Value   Sodium 155 (*)    Potassium 3.2 (*)    Chloride 115 (*)    Glucose, Bld 105 (*)    BUN 40 (*)    All other components within normal limits  CBC WITH DIFFERENTIAL/PLATELET  MAGNESIUM  PHOSPHORUS  TROPONIN I  TSH  LIPID PANEL  CBC  BASIC METABOLIC PANEL  MAGNESIUM  URINALYSIS, ROUTINE W REFLEX MICROSCOPIC  OSMOLALITY  OSMOLALITY, URINE    EKG None  Radiology No results found.  Procedures Procedures (including critical care time)  CRITICAL CARE Performed by: Merilyn Pagan   Total critical care time: 38 minutes for hypernatremia  Critical care time was exclusive of separately billable procedures and treating other patients.  Critical care was necessary to treat or prevent imminent or life-threatening deterioration.  Critical care was time spent personally by me on the following activities: development of treatment plan with patient and/or surrogate as well as nursing, discussions with consultants, evaluation of patient's  response to treatment, examination of patient, obtaining history from patient or surrogate, ordering and performing treatments and interventions, ordering and review of laboratory studies, ordering and review of radiographic studies, pulse oximetry and re-evaluation of patient's condition.   Medications Ordered in ED Medications  alum & mag hydroxide-simeth (MAALOX/MYLANTA) 200-200-20 MG/5ML suspension 30 mL (has no administration in time range)  magnesium hydroxide (MILK OF MAGNESIA) suspension 30 mL (has no administration in time range)  hydrOXYzine (ATARAX/VISTARIL) tablet 25 mg (has no administration in time range)  traZODone (DESYREL) tablet 50 mg (has no administration in time range)  haloperidol (HALDOL) tablet 5 mg (has no administration in time range)    Or  haloperidol lactate (  HALDOL) injection 5 mg (has no administration in time range)  acetaminophen (TYLENOL) tablet 650 mg (has no administration in time range)  lactated ringers bolus 1,000 mL (1,000 mLs Intravenous New Bag/Given 07/19/17 0128)  sodium chloride 0.9 % bolus 1,000 mL (1,000 mLs Intravenous New Bag/Given 07/19/17 0128)     Initial Impression / Assessment and Plan / ED Course  I have reviewed the triage vital signs and the nursing notes.  Pertinent labs & imaging results that were available during my care of the patient were reviewed by me and considered in my medical decision making (see chart for details).     57 year old comes in with chief complaint of weakness, poor po intake. Patient was recently diagnosed with UTI, and this could be the underlying cause.  She might have also been started on new psychiatric medication which could be contributing.  Exam shows suprapubic tenderness, again that is consistent with her UTI.  Basic labs have been ordered.  We will reassess.  3:37 AM Patient's sodium is 155.  The sodium has slowly trended up from 144 at presentation.  The underlying process is likely poor hydration  given that the patient's BUN/creatinine ratio is greater than 40.  We will make patient euvolemic with IV fluids.  Patient will need admission to the hospital.  Final Clinical Impressions(s) / ED Diagnoses   Final diagnoses:  Dehydration  Acute hypernatremia    ED Discharge Orders    None       Derwood Kaplan, MD 07/19/17 [phone]

## 2017-07-19 NOTE — Progress Notes (Signed)
Attempted to contact patient's husband, Ashok PallClarence Bumpass, to inform him of [facility] and her admission to a medical floor.  Called [phone] and recording stated that the voice mailbox was full.  Also called pt's cell phone [phone] and got pt's voice mail.  Will inform next shift to try again to contact family.

## 2017-07-19 NOTE — ED Notes (Signed)
Pt stood up and urinated on herself

## 2017-07-19 NOTE — ED Triage Notes (Signed)
See other record  Attached

## 2017-07-19 NOTE — H&P (Signed)
[facility] H&P   Patient Demographics:    Amy Casey, is a 57 y.o. female  MRN: [id number]   DOB - 15-Aug-1960  Admit Date - (Not on file)  Outpatient Primary MD for the patient is Tysinger, Kermit Balo, PA-C  Referring MD/NP/PA: Rhunette Croft  Outpatient Specialists:   Patient coming from: home  No chief complaint on file.     HPI:    Amy Casey  is a 57 y.o. female, w hypertension, hypokalemia, schizophrenia apparently has not been eating and drinking at [employer] [facility] and brought to ED for evaluation.   In ED,  Na 155, K 3.2, Bun 40, Creatinine 0.94 Wbc 6.4, hgb 13.0, Plt 334 Urinalysis wbc 6-10  ? UTI Magnesium 2.2 Phos 3.4 Trop <0.03  Pt will be admitted for w/up of hypernatremia, dehydration and UTI           Review of systems:    In addition to the HPI above,  No Fever-chills, No Headache, No changes with Vision or hearing, No problems swallowing food or Liquids, No Chest pain, Cough or Shortness of Breath, No Abdominal pain, No Nausea or Vommitting, Bowel movements are regular, No Blood in stool or Urine, No dysuria, No new skin rashes or bruises, No new joints pains-aches,  No new weakness, tingling, numbness in any extremity, No recent weight gain or loss, No polyuria, polydypsia or polyphagia, No significant Mental Stressors.  A full 10 point Review of Systems was done, except as stated above, all other Review of Systems were negative.   With Past History of the following :    Past Medical History:  Diagnosis Date  . Depression    Dr. Mila Homer, [facility]; hospitalization 07/2010  . History of echocardiogram 2011   [facility]  . History of renal stone   . Hypertension   . Hypokalemia   . Obesity   . Schizophrenia (HCC)   . Wears glasses       Past Surgical History:  Procedure Laterality Date  . WISDOM TOOTH EXTRACTION        Social History:     Social History   Tobacco Use  . Smoking status: Never Smoker  . Smokeless tobacco: Never Used  Substance Use Topics  . Alcohol use: No     Lives - at home  Mobility - walks byself   Family History :     Family History  Problem Relation Age of Onset  . Diabetes Mother   . Kidney disease Mother        dialysis  . Heart disease Mother   . Cancer Maternal Uncle        lung  . Stroke Neg Hx   . Hypertension Neg Hx   . Hyperlipidemia Neg Hx       Home Medications:   Prior to Admission medications   Medication Sig Start Date End Date Taking? Authorizing  Provider  amLODipine (NORVASC) 10 MG tablet Take 1 tablet (10 mg total) by mouth daily. Patient taking differently: Take 5 mg by mouth daily.  07/25/16   Tysinger, Kermit Balo, PA-C  aspirin EC 81 MG tablet Take 1 tablet (81 mg total) by mouth daily. 07/25/16   Tysinger, Kermit Balo, PA-C  benztropine (COGENTIN) 1 MG tablet Take 1 tablet (1 mg total) by mouth daily. Patient not taking: Reported on 07/11/2017 07/25/16   Tysinger, Kermit Balo, PA-C  FLUoxetine (PROZAC) 40 MG capsule Take 1 capsule (40 mg total) by mouth daily. 07/25/16   Tysinger, Kermit Balo, PA-C  LORazepam (ATIVAN) 0.5 MG tablet Take 0.5 mg by mouth daily as needed. 11/23/16   [provider]  methocarbamol (ROBAXIN) 500 MG tablet Take 1 tablet (500 mg total) by mouth 4 (four) times daily. 12/31/16   Fisher, Roselyn Bering, PA-C  OLANZapine (ZYPREXA) 15 MG tablet Take 15 mg by mouth at bedtime. 11/23/16   [provider]  potassium chloride (K-DUR) 10 MEQ tablet Take 1 tablet (10 mEq total) by mouth daily. 07/25/16   Tysinger, Kermit Balo, PA-C     Allergies:     Allergies  Allergen Reactions  . Paliperidone     Angioedema, drooling, slurred speech, ptosis  . Sulfa Antibiotics   . Lisinopril Rash    Angioedema      Physical Exam:   Vitals  There were no vitals taken for this visit.   1. General  lying in bed in NAD,   2. Normal affect  and insight, Not Suicidal or Homicidal, Awake Alert, Oriented X 3.  3. No F.N deficits, ALL C.Nerves Intact, Strength 5/5 all 4 extremities, Sensation intact all 4 extremities, Plantars down going.  4. Ears and Eyes appear Normal, Conjunctivae clear, PERRLA. Moist Oral Mucosa.  5. Supple Neck, No JVD, No cervical lymphadenopathy appriciated, No Carotid Bruits.  6. Symmetrical Chest wall movement, Good air movement bilaterally, CTAB.  7. RRR, No Gallops, Rubs or Murmurs, No Parasternal Heave.  8. Positive Bowel Sounds, Abdomen Soft, No tenderness, No organomegaly appriciated,No rebound -guarding or rigidity.  9.  No Cyanosis, Normal Skin Turgor, No Skin Rash or Bruise.  10. Good muscle tone,  joints appear normal , no effusions, Normal ROM.  11. No Palpable Lymph Nodes in Neck or Axillae     Data Review:    CBC Recent Labs  Lab 07/15/17 1532 07/17/17 1727 07/19/17 0053  WBC 7.8  --  6.4  HGB 15.0 15.0 13.0  HCT 46.8* 44.0 41.3  PLT 302  --  334  MCV 91.1  --  91.6  MCH 29.2  --  28.8  MCHC 32.1  --  31.5  RDW 13.2  --  13.1  LYMPHSABS 2.1  --  1.9  MONOABS 0.4  --  0.4  EOSABS 0.3  --  0.2  BASOSABS 0.0  --  0.0   ------------------------------------------------------------------------------------------------------------------  Chemistries  Recent Labs  Lab 07/15/17 1532 07/17/17 1727 07/19/17 0053  NA 148* 152* 155*  K 3.2* 3.3* 3.2*  CL 109 109 115*  CO2 25  --  28  GLUCOSE 87 159* 105*  BUN 32* 55* 40*  CREATININE 0.87 1.20* 0.94  CALCIUM 9.6  --  9.4  MG 2.4  --  2.2  AST 22  --  31  ALT 26  --  35  ALKPHOS 79  --  66  BILITOT 0.7  --  0.6   ------------------------------------------------------------------------------------------------------------------ estimated creatinine clearance is  70.5 mL/min (by C-G formula based on SCr of 0.94  mg/dL). ------------------------------------------------------------------------------------------------------------------ No results for input(s): TSH, T4TOTAL, T3FREE, THYROIDAB in the last 72 hours.  Invalid input(s): FREET3  Coagulation profile No results for input(s): INR, PROTIME in the last 168 hours. ------------------------------------------------------------------------------------------------------------------- No results for input(s): DDIMER in the last 72 hours. -------------------------------------------------------------------------------------------------------------------  Cardiac Enzymes Recent Labs  Lab 07/19/17 0053  TROPONINI <0.03   ------------------------------------------------------------------------------------------------------------------ No results found for: BNP   ---------------------------------------------------------------------------------------------------------------  Urinalysis    Component Value Date/Time   COLORURINE YELLOW 07/19/2017 0053   APPEARANCEUR HAZY (A) 07/19/2017 0053   LABSPEC 1.020 07/19/2017 0053   PHURINE 5.0 07/19/2017 0053   GLUCOSEU NEGATIVE 07/19/2017 0053   HGBUR SMALL (A) 07/19/2017 0053   BILIRUBINUR NEGATIVE 07/19/2017 0053   BILIRUBINUR n 06/02/2016 1251   KETONESUR 20 (A) 07/19/2017 0053   PROTEINUR NEGATIVE 07/19/2017 0053   UROBILINOGEN negative 06/02/2016 1251   UROBILINOGEN 1.0 02/19/2014 1849   NITRITE NEGATIVE 07/19/2017 0053   LEUKOCYTESUR MODERATE (A) 07/19/2017 0053    ----------------------------------------------------------------------------------------------------------------   Imaging Results:    No results found.     Assessment & Plan:    Principal Problem:   Hypernatremia Active Problems:   Dehydration   UTI (urinary tract infection)    Hypernatremia Hydrate with 1/2 ns iv Check cmp in am  Dehydration Hydrate with 1/2 ns iv  UTI  Urine culture Rocephin 1gm iv  qday  Depression Cont Prozac Cont Zyprexa  Hypertension Cont amlodipine    DVT Prophylaxis  Lovenox - SCDs   AM Labs Ordered, also please review Full Orders  Family Communication: Admission, patients condition and plan of care including tests being ordered have been discussed with the patient  who indicate understanding and agree with the plan and Code Status.  Code Status  FULL CODE  Likely DC to  home  Condition GUARDED    Consults called:  none  Admission status:  observation  Time spent in minutes : 45   Pearson GrippeJames Keydi Giel M.D on 07/19/2017 at 5:56 AM  Between 7am to 7pm - Pager - [phone]. After 7pm go to [email or web address] - password [email or web address]  [facility] - Office  [phone]

## 2017-07-19 NOTE — ED Notes (Signed)
2nd attempt to obtain vitals, cardiac monitoring, IV, etc. Pt still refuses

## 2017-07-19 NOTE — ED Provider Notes (Signed)
Due to billing reasons, I was advised that patient will need to be discharged by behavioral health and then readmitted to the hospital.  My note is already completed.  Please refer to my H&P which might be tagged with her last visit for ED encounter related to the current admission.   Derwood KaplanNanavati, Emmogene Simson, MD 07/19/17 [phone]

## 2017-07-19 NOTE — ED Notes (Addendum)
Patient standing in room. Patient will not sit down or follow commands. Patient will not answer questions. Patients scrubs are wet and patient will not change or allow this writer to help her. Will continue to monitor patient.

## 2017-07-19 NOTE — ED Notes (Signed)
Pt refuses cardiac monitor, blood pressure check, pulse ox. When asked why pt is refusing, pt did not have an answer.

## 2017-07-19 NOTE — ED Notes (Signed)
Please refer to previous chart-[facility]

## 2017-07-19 NOTE — ED Notes (Signed)
Called lab added on urine and blood osmolarity

## 2017-07-19 NOTE — ED Notes (Signed)
Pt refuses an IV and refuses to be on the cardiac monitor

## 2017-07-19 NOTE — Progress Notes (Signed)
[facility] Progress Note  Subjective: pt worse, withdrawn, almost catatonic, will whisper 1 or 2 words then stare into space  Vitals:   07/19/17 1000 07/19/17 1100 07/19/17 1130  BP: (!) 167/103 (!) 167/101 (!) 161/94  Pulse: 80 83 78  Resp: 17 18 18   SpO2: 99% 100% 100%    Inpatient medications: . amLODipine  5 mg Oral Daily  . aspirin EC  81 mg Oral Daily  . enoxaparin (LOVENOX) injection  40 mg Subcutaneous Q24H  . FLUoxetine  40 mg Oral Daily  . methocarbamol  500 mg Oral QID  . OLANZapine  15 mg Oral QHS  . potassium chloride  10 mEq Oral Daily   . dextrose 5 % and 0.2 % NaCl    . sodium chloride     acetaminophen **OR** acetaminophen, LORazepam  Exam: Sitting in chair, severely withdrawn, barely responds to questions, then stares off into space Dry mouth No jvd Chest - refused Cor - refused Abd - refused Ext - refused Neuro- refused exam  Home meds: - norvasc 10 qd/ Kdur 10 qd - cogentin 1 mg qd/ prozac 40 qd/ zyprexa 15 hs - robaxin prn/ ativan 0.5 mg prn daily  Brief Summary: Amy Casey  is a 57 y.o. female, w hypertension, hypokalemia, schizophrenia apparently has not been eating and drinking at [facility] and brought to ED for evaluation. In the ED pt was IVC'd.    In ED,  Na 155, K 3.2, Bun 40, Creatinine 0.94 Wbc 6.4, hgb 13.0, Plt 334 Urinalysis wbc 6-10  ? UTI Magnesium 2.2 Phos 3.4 Trop <0.03  Pt was admitted for w/up of hypernatremia, dehydration and UTI       Problems:  Principal Problem:   Hypernatremia   Dehydration   UTI (urinary tract infection)   Schizophrenia   Depression  Impression/Plan:  1) Hypernatremia - hydrate with d5 1/4 ns iv - check cmp in am  2) Dehydration - hydrate with 1/4 ns iv  3) +Urine culture: from 07/16/17; UA from 4/15 had 6-30 wbc's and from 4/23 had 6-10 wbc. Hard to tell if patient is symptomatic or not, but this could be part of why she is not eating/ dehydrated. Hard to  say.  Afebrile.  - will treat w/ 3-7 days of therapy. Start w IV Cefepime for now, awaiting sensitivities.   4) Depression - cont Prozac, cont Zyprexa - have d/w psychiatry on call, they will see pt tomorrow; for now will need to restrain patient and/ or sedate w/ IV medications (haldol IV prn ordered, have d/w pharm) - need to find IVC papers before will be able to force Rx on her  5) Hypertension - cont amlodipine 5 qd - add IV hydralazine prn    DVT Prophylaxis  Lovenox - SCDs   Code Status  FULL CODE  Dispo: unclear  Condition: GUARDED    Consults called:  psych  Admission status:  observation    Vinson Moselle MD [facility] pgr [phone] 07/19/2017, 3:58 PM   Recent Labs  Lab 07/15/17 1532 07/17/17 1727 07/19/17 0053  NA 148* 152* 155*  K 3.2* 3.3* 3.2*  CL 109 109 115*  CO2 25  --  28  GLUCOSE 87 159* 105*  BUN 32* 55* 40*  CREATININE 0.87 1.20* 0.94  CALCIUM 9.6  --  9.4  PHOS  --   --  3.4   Recent Labs  Lab 07/15/17 1532 07/19/17 0053  AST 22 31  ALT  26 35  ALKPHOS 79 66  BILITOT 0.7 0.6  PROT 8.4* 7.6  ALBUMIN 4.2 3.7   Recent Labs  Lab 07/15/17 1532 07/17/17 1727 07/19/17 0053  WBC 7.8  --  6.4  NEUTROABS 5.1  --  3.8  HGB 15.0 15.0 13.0  HCT 46.8* 44.0 41.3  MCV 91.1  --  91.6  PLT 302  --  334   Iron/TIBC/Ferritin/ %Sat No results found for: IRON, TIBC, FERRITIN, IRONPCTSAT

## 2017-07-19 NOTE — ED Notes (Signed)
Pt was found standing up in room. Attempted to provide comfort measures for patient but patient refuses.

## 2017-07-20 DIAGNOSIS — N179 Acute kidney failure, unspecified: Secondary | ICD-10-CM

## 2017-07-20 DIAGNOSIS — M542 Cervicalgia: Secondary | ICD-10-CM

## 2017-07-20 DIAGNOSIS — F329 Major depressive disorder, single episode, unspecified: Secondary | ICD-10-CM

## 2017-07-20 DIAGNOSIS — G4700 Insomnia, unspecified: Secondary | ICD-10-CM

## 2017-07-20 DIAGNOSIS — Z818 Family history of other mental and behavioral disorders: Secondary | ICD-10-CM

## 2017-07-20 DIAGNOSIS — F5089 Other specified eating disorder: Secondary | ICD-10-CM

## 2017-07-20 DIAGNOSIS — F200 Paranoid schizophrenia: Secondary | ICD-10-CM

## 2017-07-20 DIAGNOSIS — E876 Hypokalemia: Secondary | ICD-10-CM

## 2017-07-20 DIAGNOSIS — K0889 Other specified disorders of teeth and supporting structures: Secondary | ICD-10-CM

## 2017-07-20 DIAGNOSIS — N390 Urinary tract infection, site not specified: Secondary | ICD-10-CM

## 2017-07-20 DIAGNOSIS — M25519 Pain in unspecified shoulder: Secondary | ICD-10-CM

## 2017-07-20 LAB — COMPREHENSIVE METABOLIC PANEL
ALT: 28 U/L (ref 14–54)
AST: 22 U/L (ref 15–41)
Albumin: 3.2 g/dL — ABNORMAL LOW (ref 3.5–5.0)
Alkaline Phosphatase: 57 U/L (ref 38–126)
Anion gap: 8 (ref 5–15)
BUN: 15 mg/dL (ref 6–20)
CO2: 25 mmol/L (ref 22–32)
Calcium: 8.1 mg/dL — ABNORMAL LOW (ref 8.9–10.3)
Chloride: 108 mmol/L (ref 101–111)
Creatinine, Ser: 0.65 mg/dL (ref 0.44–1.00)
GFR calc Af Amer: >60 mL/min (ref >60)
GFR calc non Af Amer: >60 mL/min (ref >60)
Glucose, Bld: 153 mg/dL — ABNORMAL HIGH (ref 65–99)
Potassium: 2.5 mmol/L — CL (ref 3.5–5.1)
Sodium: 141 mmol/L (ref 135–145)
Total Bilirubin: 0.7 mg/dL (ref 0.3–1.2)
Total Protein: 6.4 g/dL — ABNORMAL LOW (ref 6.5–8.1)

## 2017-07-20 LAB — CBC
HCT: 37.6 % (ref 36.0–46.0)
Hemoglobin: 11.9 g/dL — ABNORMAL LOW (ref 12.0–15.0)
MCH: 28.5 pg (ref 26.0–34.0)
MCHC: 31.6 g/dL (ref 30.0–36.0)
MCV: 90.2 fL (ref 78.0–100.0)
Platelets: 302 K/uL (ref 150–400)
RBC: 4.17 MIL/uL (ref 3.87–5.11)
RDW: 12.9 % (ref 11.5–15.5)
WBC: 5.9 K/uL (ref 4.0–10.5)

## 2017-07-20 LAB — HIV ANTIBODY (ROUTINE TESTING W REFLEX): HIV Screen 4th Generation wRfx: NONREACTIVE

## 2017-07-20 LAB — URINE CULTURE: Culture: NO GROWTH

## 2017-07-20 MED ORDER — POTASSIUM CHLORIDE 10 MEQ/100ML IV SOLN: 10.0000 meq | INTRAVENOUS | Status: DC

## 2017-07-20 MED ORDER — SODIUM CHLORIDE 0.9 % IV SOLN
1.0000 g | Freq: Two times a day (BID) | INTRAVENOUS | Status: DC
  Administered 2017-07-20 – 2017-07-21 (×2): 1 g via INTRAVENOUS
  Filled 2017-07-20 (×3): qty 1

## 2017-07-20 MED ORDER — CIPROFLOXACIN IN D5W 400 MG/200ML IV SOLN: 400.0000 mg | Freq: Two times a day (BID) | INTRAVENOUS | Status: DC

## 2017-07-20 MED ORDER — POTASSIUM CHLORIDE CRYS ER 20 MEQ PO TBCR: 80.0000 meq | EXTENDED_RELEASE_TABLET | Freq: Once | ORAL | Status: DC

## 2017-07-20 MED ORDER — FOSFOMYCIN TROMETHAMINE 3 G PO PACK
3.0000 g | PACK | Freq: Once | ORAL | Status: DC
  Filled 2017-07-20: qty 3

## 2017-07-20 MED ORDER — POTASSIUM CHLORIDE CRYS ER 20 MEQ PO TBCR: 40.0000 meq | EXTENDED_RELEASE_TABLET | Freq: Four times a day (QID) | ORAL | Status: DC

## 2017-07-20 MED ORDER — POTASSIUM CHLORIDE 10 MEQ/100ML IV SOLN
10.0000 meq | INTRAVENOUS | Status: AC
  Administered 2017-07-20 (×6): 10 meq via INTRAVENOUS
  Filled 2017-07-20 (×2): qty 100

## 2017-07-20 NOTE — Consult Note (Addendum)
[facility] Consult   Reason for Consult:  Depression Referring Physician:  Dr. Jonnie Finner Patient Identification: Amy Casey MRN:  [id number] Principal Diagnosis: Schizophrenia [facility]) Diagnosis:   Patient Active Problem List   Diagnosis Date Noted  . Dehydration [E86.0] 07/19/2017  . Hypernatremia [E87.0] 07/19/2017  . UTI (urinary tract infection) [N39.0] 07/19/2017  . Schizophrenia ([place]) [F20.9] 07/18/2017  . High risk medication use [Z79.899] 07/25/2016  . Involuntary commitment [Z04.6]   . Paranoid schizophrenia ([place]) [F20.0] 06/16/2015  . Noncompliance [Z91.19] 06/16/2015  . Altered awareness, transient [R40.4]   . Altered mental status [R41.82] 02/19/2014  . Essential hypertension, benign [I10] 02/10/2012  . Tooth decay [K02.9] 02/10/2012  . Obesity [E66.9] 02/10/2012  . Impaired fasting blood sugar [R73.01] 02/10/2012    Total Time spent with patient: 1 hour  Subjective:   Amy Casey is a 57 y.o. female patient admitted with hypernatremia, dehydration and UTI.  HPI:   Per chart review, patient was initially planned for admission to [facility] for schizophrenia with inability to care for self but she required admission to the hospitalist service for treatment of medical conditions. Per primary team, she lacks insight about her condition. She has been refusing treatment. She pulled out her IV. She appears withdrawn and almost catatonic. She whispers 1 or 2 words then stares into space. She has not been eating or drinking. Sodium was 155 on admission. She is receiving Prozac 40 mg daily, Cogentin 1 mg daily and Zyprexa 15 mg qhs. She received IV Haldol 2 mg yesterday. She has been refusing her medications.   Of note, she was initially seen by telepsych on 4/17 with thought blocking and whispering voice. She was started on Keflex the day prior for a UTI. She was thought to have EPS related to Zyprexa since complaint of stiffness in jaw. She was given Cogentin with  no improvement. Zyprexa was decreased from 15 mg to 7.5 mg BID and Klonopin 0.25 mg BID was started. She was admitted to [facility] for one day. She had decreased mentation and was admitted to the ED for medical care.   On interview, Amy Casey reports that she is feeling a little better but does note that she feels sad.  She reports feeling confused about a lot of things.  She is unable to elaborate about this.  She reports taking Prozac and Zyprexa for the past year.  She reports intermittently missing up to a couple of days at a time of her medications.  She is seen at [facility].  She denies SI, HI or AVH.  She denies a history of suicide attempts.  She acknowledges that she has schizophrenia.  She reports having generalized paranoia.  She reports poor appetite.  She has not been eating or drinking.  She is unable to state why she has poor PO intake but her nurse reports that she believes that the food is poisoned.  She reports poor sleep with multiple nighttime awakenings.  She believes that she has lost some weight but is unsure how much.  Past Psychiatric History: Schizophrenia  Risk to Self: Is patient at risk for suicide?: No Risk to Others:   Prior Inpatient Therapy:  She has a history of multiple hospitalizations and last in 10/2016.  Prior Outpatient Therapy:  Prior medications include Risperdal 3 mg daily, Saphris 20 mg qhs, Invega 6 mg daily and Prozac 60 mg daily.  Past Medical History:  Past Medical History:  Diagnosis Date  . Depression    Dr. Silvio Pate,  [facility]; hospitalization 07/2010  . History of echocardiogram 2011   [facility]  . History of renal stone   . Hypertension   . Hypokalemia   . Obesity   . Schizophrenia ([place])   . Wears glasses     Past Surgical History:  Procedure Laterality Date  . WISDOM TOOTH EXTRACTION     Family History:  Family History  Problem Relation Age of Onset  . Diabetes Mother   . Kidney disease Mother        dialysis  . Heart disease Mother   .  Cancer Maternal Uncle        lung  . Stroke Neg Hx   . Hypertension Neg Hx   . Hyperlipidemia Neg Hx    Family Psychiatric  History: Mother had mental health problems.  Social History:  Social History   Substance and Sexual Activity  Alcohol Use No     Social History   Substance and Sexual Activity  Drug Use No    Social History   Socioeconomic History  . Marital status: Married    Spouse name: Not on file  . Number of children: Not on file  . Years of education: Not on file  . Highest education level: Not on file  Occupational History  . Not on file  Social Needs  . Financial resource strain: Not on file  . Food insecurity:    Worry: Not on file    Inability: Not on file  . Transportation needs:    Medical: Not on file    Non-medical: Not on file  Tobacco Use  . Smoking status: Never Smoker  . Smokeless tobacco: Never Used  Substance and Sexual Activity  . Alcohol use: No  . Drug use: No  . Sexual activity: Not Currently  Lifestyle  . Physical activity:    Days per week: Not on file    Minutes per session: Not on file  . Stress: Not on file  Relationships  . Social connections:    Talks on phone: Not on file    Gets together: Not on file    Attends religious service: Not on file    Active member of club or organization: Not on file    Attends meetings of clubs or organizations: Not on file    Relationship status: Not on file  Other Topics Concern  . Not on file  Social History Narrative   Married, 2 children, mother in law lives with them, was working as a Horticulturist, commercial Social History: She lives with her husband and 2 grandchildren which are her daughter's children. She has been married for 8 years. This is her 3rd marriage. She works at [employer] She denies alcohol or illicit substance use.     Allergies:   Allergies  Allergen Reactions  . Paliperidone     Angioedema, drooling, slurred speech, ptosis  . Sulfa Antibiotics   . Lisinopril  Rash    Angioedema     Labs:  Results for orders placed or performed during the hospital encounter of 07/19/17 (from the past 48 hour(s))  Culture, Urine     Status: None   Collection Time: 07/19/17 12:53 AM  Result Value Ref Range   Specimen Description      URINE, CLEAN CATCH Performed at [facility], [place] [address], [place], [place] [address]    Special Requests      NONE Performed at [facility], [place] [address] [place],  [place] [address]    Culture      NO GROWTH Performed at [facility], [place] [address], [place], [place] [address]    Report Status 07/20/2017 FINAL   HIV antibody (Routine Testing)     Status: None   Collection Time: 07/19/17  9:05 AM  Result Value Ref Range   HIV Screen 4th Generation wRfx Non Reactive Non Reactive    Comment: (NOTE) Performed At: [facility] [place], [place] [id number] Rush Farmer MD TU:[id number] Performed at [facility], [place] [address], [place], [place] [address]   MRSA PCR Screening     Status: Abnormal   Collection Time: 07/19/17  6:18 PM  Result Value Ref Range   MRSA by PCR POSITIVE (A) NEGATIVE    Comment:        The GeneXpert MRSA Assay (FDA approved for NASAL specimens only), is one component of a comprehensive MRSA colonization surveillance program. It is not intended to diagnose MRSA infection nor to guide or monitor treatment for MRSA infections. RESULT CALLED TO, READ BACK BY AND VERIFIED WITH: Coletta Memos '@[id number]'  07/19/17 MKELLY Performed at [facility], [place] [address] [place], [place] [address]   CBC     Status: None   Collection Time: 07/19/17  8:46 PM  Result Value Ref Range   WBC 6.7 4.0 - 10.5 K/uL   RBC 4.31 3.87 - 5.11 MIL/uL   Hemoglobin 12.3 12.0 - 15.0 g/dL   HCT 39.1 36.0 - 46.0 %   MCV 90.7 78.0 - 100.0 fL   MCH 28.5 26.0 - 34.0 pg   MCHC 31.5 30.0 - 36.0 g/dL   RDW 12.7 11.5 -  15.5 %   Platelets 289 150 - 400 K/uL    Comment: Performed at [facility], [place] [address] [place], [place] [address]  Comprehensive metabolic panel     Status: Abnormal   Collection Time: 07/20/17  3:11 AM  Result Value Ref Range   Sodium 141 135 - 145 mmol/L    Comment: DELTA CHECK NOTED   Potassium 2.5 (LL) 3.5 - 5.1 mmol/L    Comment: DELTA CHECK NOTED CRITICAL RESULT CALLED TO, READ BACK BY AND VERIFIED WITH: WILLING,K AT 5537 ON 07/20/17 BY MOHAMED,A    Chloride 108 101 - 111 mmol/L   CO2 25 22 - 32 mmol/L   Glucose, Bld 153 (H) 65 - 99 mg/dL   BUN 15 6 - 20 mg/dL   Creatinine, Ser 0.65 0.44 - 1.00 mg/dL   Calcium 8.1 (L) 8.9 - 10.3 mg/dL   Total Protein 6.4 (L) 6.5 - 8.1 g/dL   Albumin 3.2 (L) 3.5 - 5.0 g/dL   AST 22 15 - 41 U/L   ALT 28 14 - 54 U/L   Alkaline Phosphatase 57 38 - 126 U/L   Total Bilirubin 0.7 0.3 - 1.2 mg/dL   GFR calc non Af Amer >60 >60 mL/min   GFR calc Af Amer >60 >60 mL/min    Comment: (NOTE) The eGFR has been calculated using the CKD EPI equation. This calculation has not been validated in all clinical situations. eGFR's persistently <60 mL/min signify possible Chronic Kidney Disease.    Anion gap 8 5 - 15    Comment: Performed at [facility] [place] [address], [place], [place] [address]  CBC     Status: Abnormal   Collection Time: 07/20/17  3:11 AM  Result Value Ref Range  WBC 5.9 4.0 - 10.5 K/uL   RBC 4.17 3.87 - 5.11 MIL/uL   Hemoglobin 11.9 (L) 12.0 - 15.0 g/dL   HCT 37.6 36.0 - 46.0 %   MCV 90.2 78.0 - 100.0 fL   MCH 28.5 26.0 - 34.0 pg   MCHC 31.6 30.0 - 36.0 g/dL   RDW 12.9 11.5 - 15.5 %   Platelets 302 150 - 400 K/uL    Comment: Performed at [facility], [place] [address] [place], [place] [address]    Current Facility-Administered Medications  Medication Dose Route Frequency Provider Last Rate Last Dose  . acetaminophen (TYLENOL) tablet 650 mg  650 mg Oral Q6H PRN Jani Gravel, MD       Or  . acetaminophen (TYLENOL) suppository 650 mg  650 mg Rectal Q6H PRN Jani Gravel, MD      . amLODipine (NORVASC) tablet 5 mg  5 mg Oral Daily Jani Gravel, MD      . aspirin EC tablet 81 mg  81 mg Oral Daily Jani Gravel, MD      . benztropine (COGENTIN) tablet 1 mg  1 mg Oral Daily Roney Jaffe, MD      . Chlorhexidine Gluconate Cloth 2 % PADS 6 each  6 each Topical Q0600 Bennie Pierini, MD      . enoxaparin (LOVENOX) injection 40 mg  40 mg Subcutaneous Q24H Jani Gravel, MD      . FLUoxetine (PROZAC) capsule 40 mg  40 mg Oral Daily Jani Gravel, MD      . haloperidol lactate (HALDOL) injection 1-2 mg  1-2 mg Intravenous Q3H PRN Roney Jaffe, MD   2 mg at 07/19/17 2019  . hydrALAZINE (APRESOLINE) injection 10-20 mg  10-20 mg Intravenous Q4H PRN Roney Jaffe, MD   20 mg at 07/19/17 2032  . LORazepam (ATIVAN) tablet 0.5 mg  0.5 mg Oral Daily PRN Jani Gravel, MD      . methocarbamol (ROBAXIN) tablet 500 mg  500 mg Oral QID Jani Gravel, MD      . mupirocin ointment (BACTROBAN) 2 % 1 application  1 application Nasal BID Bennie Pierini, MD      . OLANZapine [facility]) tablet 15 mg  15 mg Oral QHS Jani Gravel, MD      . potassium chloride (K-DUR) CR tablet 10 mEq  10 mEq Oral Daily Jani Gravel, MD      . potassium chloride 10 mEq in 100 mL IVPB  10 mEq Intravenous Q1 Hr x 6 Kirby-Graham, Karsten Fells, NP   Stopped at 07/20/17 6811    Musculoskeletal: Strength & Muscle Tone: decreased due to physical deconditioning.  Gait & Station: UTA since patient was lying in bed. Patient leans: N/A  Psychiatric Specialty Exam: Physical Exam  Nursing note and vitals reviewed. Constitutional: She is oriented to person, place, and time. She appears well-developed and well-nourished.  HENT:  Head: Normocephalic and atraumatic.  Neck: Normal range of motion.  Respiratory: Effort normal.  Musculoskeletal: Normal range of motion.  Neurological: She is alert and oriented to person, place, and  time.  Skin: No rash noted.  Psychiatric: Her speech is normal. She is withdrawn. Thought content is paranoid. Cognition and memory are impaired. She expresses impulsivity. She exhibits a depressed mood.    Review of Systems  Constitutional: Negative for chills and fever.  Cardiovascular: Negative for chest pain.  Gastrointestinal: Negative for constipation, diarrhea, nausea and vomiting.  Musculoskeletal: Positive for neck pain.  Shoulder pain.  Psychiatric/Behavioral: Positive for depression. Negative for hallucinations, substance abuse and suicidal ideas. The patient has insomnia.   All other systems reviewed and are negative.   Blood pressure 138/84, pulse 79, temperature 97.8 F (36.6 C), temperature source Oral, resp. rate 19, height 5' 10.5" (1.791 m), weight 79.4 kg (175 lb 0.7 oz), SpO2 100 %.Body mass index is 24.76 kg/m.  General Appearance: Disheveled, middle aged, African American female, wearing a hospital gown with dry lips and poor dentition who is lying in bed. NAD.   Eye Contact:  Good  Speech:  Clear and Coherent and Normal Rate  Volume:  Decreased  Mood:  "Sad"  Affect:  Blunt  Thought Process:  Goal Directed, Linear and Descriptions of Associations: Intact  Orientation:  Full (Time, Place, and Person)  Thought Content:  Paranoid Ideation  Suicidal Thoughts:  No  Homicidal Thoughts:  No  Memory:  Immediate;   Fair Recent;   Fair Remote;   Fair  Judgement:  Impaired  Insight:  Fair  Psychomotor Activity:  Decreased  Concentration:  Concentration: Fair and Attention Span: Fair  Recall:  [employer] of Knowledge:  Fair  Language:  Fair  Akathisia:  No  Handed:  Right  AIMS (if indicated):   N/A  Assets:  Financial Resources/Insurance Housing Intimacy Social Support  ADL's:  Impaired  Cognition: Impaired due to psychiatric illness.   Sleep:   Fair   Assessment:  Amy Casey is a 57 y.o. female who was admitted with hypernatremia, dehydration  and UTI. She was initially seen on 4/17 by telepsych and was admitted to [facility] for one day prior to requiring admission due to current medical condition due to poor PO intake. She continues to warrant inpatient psychiatric hospitalization due to inability to care for self secondary to psychiatric illness. She reports paranoia about her food and has not been eating or drinking. She warrants forced medications since she is refusing PO medications and lacks insight about her condition.   Treatment Plan Summary: -Patient warrants inpatient psychiatric hospitalization given high risk of harm to self. -Continue Engineer, materials.  -Continue home medications: Zyprexa 15 mg qhs for schizophrenia, Cogentin 1 mg daily for EPS and Prozac 40 mg daily for depression and anxiety.  -Patient warrants forced medications if refuses. Please provide IM Zyprexa and Cogentin if refuses PO Zyprexa.   -Please pursue involuntary commitment if patient refuses voluntary psychiatric hospitalization or attempts to leave the hospital.  -Will sign off on patient at this time. Please consult psychiatry again as needed.     Disposition: Recommend psychiatric Inpatient admission when medically cleared.  Faythe Dingwall, DO 07/20/2017 9:04 AM

## 2017-07-20 NOTE — Progress Notes (Signed)
Patient refusing all medications, patient upset potassium was hung explained necessity of medication. Patient untrusting of staff despite therapeutic speech. Will continue to re-educate and encourage PO meds and PO intake

## 2017-07-20 NOTE — Progress Notes (Signed)
Patient continuing to refuse PO medications, MD called to notify. MD came to bedside to help reiterate need for medications. Patient continued to refuse. Medications changed to IV as appropriate.

## 2017-07-20 NOTE — Progress Notes (Addendum)
[facility] Progress Note  Subjective: moved to ICU/ SDU and received IVF"s . IV remains in place. Got IV haldol 2 mg twice last night, IV hydralazine once for BP.   Still refusing po meds.  Looks a little better today, not as parched.   Vitals:   07/20/17 0500 07/20/17 0600 07/20/17 0700 07/20/17 0800  BP: (!) 169/81 (!) 145/90 (!) 140/122 138/84  Pulse: 79 75 78 79  Resp: 15 12 14 19   Temp:    97.8 F (36.6 C)  TempSrc:    Oral  SpO2: 99% 99% 98% 100%  Weight:      Height:        Inpatient medications: . amLODipine  5 mg Oral Daily  . aspirin EC  81 mg Oral Daily  . benztropine  1 mg Oral Daily  . Chlorhexidine Gluconate Cloth  6 each Topical Q0600  . enoxaparin (LOVENOX) injection  40 mg Subcutaneous Q24H  . FLUoxetine  40 mg Oral Daily  . methocarbamol  500 mg Oral QID  . mupirocin ointment  1 application Nasal BID  . OLANZapine  15 mg Oral QHS  . potassium chloride  10 mEq Oral Daily   . potassium chloride Stopped (07/20/17 1059)   acetaminophen **OR** acetaminophen, haloperidol lactate, hydrALAZINE, LORazepam  Exam: Lying in bed, less parched in the mouth, a bit more alert Dry mouth No jvd Chest cta bilat Cor RRR no mrg Abd soft ntnd Ext no LE edema Neuro oriented to self, not to place or time  Home meds: - norvasc 10 qd/ Kdur 10 qd - cogentin 1 mg qd/ prozac 40 qd/ zyprexa 15 hs - robaxin prn/ ativan 0.5 mg prn daily  Brief Summary: Amy Casey  is a 57 y.o. female, w hypertension, hypokalemia, schizophrenia apparently has not been eating and drinking at [facility] and brought to ED for evaluation. In the ED pt was IVC'd.    In ED,  Na 155, K 3.2, Bun 40, Creatinine 0.94 Wbc 6.4, hgb 13.0, Plt 334 Urinalysis wbc 6-10  ? UTI Magnesium 2.2 Phos 3.4 Trop <0.03  Pt was admitted for w/up of hypernatremia, dehydration and UTI       Problems:  Principal Problem:   Hypernatremia   Dehydration   UTI (urinary tract infection)    Schizophrenia   Depression  Impression/Plan:  1) Hypernatremia - hydrating with d5 1/4 NS, Na down 155 >> 141 this am - cont IVF's another 24 hrs or so, then should be ready for behavioral health admission - recheck Na in am  2) Dehydration - as above - looks better on exam  3) UTI: questionable diagnosis, have d/w ID who felt that this was likely contaminant and didn't recommend treating unless there were symptoms of UTI which there hasn't been. And pt is afebrile. Also the culture from 4/24 here was negative, just 3days after the +urine culture - will call this a false positive urine Cx for pseudomonas from 4/21, dc all abx     4) Depression - we have to redo the IVC papers apparently - getting IV haldol 2mg  prn, got two doses last night - cont Prozac, cont Zyprexa - have d/w psychiatry on call, they will see pt today  5) Hypertension - cont amlodipine 5 qd - add IV hydralazine prn    DVT Prophylaxis  Lovenox - SCDs   Code Status  FULL CODE  Dispo: unclear  Condition: GUARDED    Consults called:  psych  Admission status:  inpatient    Vinson Moselleob Rj Pedrosa MD [facility] pgr [phone] 07/20/2017, 11:17 AM   Recent Labs  Lab 07/19/17 0053 07/19/17 1914 07/20/17 0311  NA 155* 147* 141  K 3.2* 3.7 2.5*  CL 115* 113* 108  CO2 28 22 25   GLUCOSE 105* 91 153*  BUN 40* 19 15  CREATININE 0.94 0.71 0.65  CALCIUM 9.4 8.5* 8.1*  PHOS 3.4  --   --    Recent Labs  Lab 07/15/17 1532 07/19/17 0053 07/20/17 0311  AST 22 31 22   ALT 26 35 28  ALKPHOS 79 66 57  BILITOT 0.7 0.6 0.7  PROT 8.4* 7.6 6.4*  ALBUMIN 4.2 3.7 3.2*   Recent Labs  Lab 07/15/17 1532  07/19/17 0053 07/19/17 2046 07/20/17 0311  WBC 7.8  --  6.4 6.7 5.9  NEUTROABS 5.1  --  3.8  --   --   HGB 15.0   < > 13.0 12.3 11.9*  HCT 46.8*   < > 41.3 39.1 37.6  MCV 91.1  --  91.6 90.7 90.2  PLT 302  --  334 289 302   < > = values in this interval not displayed.    Iron/TIBC/Ferritin/ %Sat No results found for: IRON, TIBC, FERRITIN, IRONPCTSAT

## 2017-07-20 NOTE — Progress Notes (Signed)
LCSw initiated IVC paperwork.  Awaiting patient to be served.   LCSw will continue to follow for disposition.   Amy GandyBernette Fartun Casey, LSCW [facility] CSW [phone]

## 2017-07-21 DIAGNOSIS — F209 Schizophrenia, unspecified: Secondary | ICD-10-CM

## 2017-07-21 LAB — BASIC METABOLIC PANEL
Anion gap: 12 (ref 5–15)
BUN: 11 mg/dL (ref 6–20)
CO2: 24 mmol/L (ref 22–32)
Calcium: 8.8 mg/dL — ABNORMAL LOW (ref 8.9–10.3)
Chloride: 107 mmol/L (ref 101–111)
Creatinine, Ser: 0.87 mg/dL (ref 0.44–1.00)
GFR calc Af Amer: >60 mL/min (ref >60)
GFR calc non Af Amer: >60 mL/min (ref >60)
Glucose, Bld: 100 mg/dL — ABNORMAL HIGH (ref 65–99)
Potassium: 3.1 mmol/L — ABNORMAL LOW (ref 3.5–5.1)
Sodium: 143 mmol/L (ref 135–145)

## 2017-07-21 MED ORDER — POTASSIUM CHLORIDE CRYS ER 10 MEQ PO TBCR
40.0000 meq | EXTENDED_RELEASE_TABLET | Freq: Once | ORAL | Status: DC
  Filled 2017-07-21: qty 2

## 2017-07-21 MED ORDER — MAGNESIUM SULFATE 2 GM/50ML IV SOLN
2.0000 g | Freq: Once | INTRAVENOUS | Status: AC
  Administered 2017-07-21: 2 g via INTRAVENOUS
  Filled 2017-07-21: qty 50

## 2017-07-21 MED ORDER — POTASSIUM CHLORIDE 10 MEQ/100ML IV SOLN
10.0000 meq | INTRAVENOUS | Status: AC
  Administered 2017-07-21 (×4): 10 meq via INTRAVENOUS
  Filled 2017-07-21 (×3): qty 100

## 2017-07-21 MED ORDER — ORAL CARE MOUTH RINSE
15.0000 mL | Freq: Two times a day (BID) | OROMUCOSAL | Status: DC
  Administered 2017-07-24: 15 mL via OROMUCOSAL

## 2017-07-21 MED ORDER — KCL IN DEXTROSE-NACL 20-5-0.45 MEQ/L-%-% IV SOLN
INTRAVENOUS | Status: DC
  Administered 2017-07-21 – 2017-07-22 (×2): via INTRAVENOUS
  Filled 2017-07-21 (×5): qty 1000

## 2017-07-21 NOTE — Progress Notes (Signed)
PROGRESS NOTE    Amy Casey  ZOX:[id number] DOB: 09/03/1960 DOA: 07/19/2017 PCP: Jac Canavanysinger, David S, PA-C    Brief Narrative:  57 year old female who presented with significant decreased p.o. intake and dehydration.  She does have a significant past medical history for hypertension, hypokalemia and schizophrenia.  [facility], and transferred to the inpatient unit due to worsening p.o. intake and hypernatremia.  On the initial physical examination blood pressure was 165/91, heart rate 82, temperature 97.9, respiratory rate 19, oxygen saturation 100%.  Dry mucous membranes, lungs were clear to auscultation bilaterally, heart S1-S2 present and rhythmic, the abdomen was soft nontender, no lower extremity edema.  Sodium 155, potassium 3.2, chloride 115, bicarb 28, glucose 105, BUN 40, creatinine 0.95, phosphorus 3.4, magnesium 2.2, AST 31, ALT 35, serum osmolality 329, white count 6.4, hemoglobin 13.0, hematocrit 41.3, platelets 334.  Urinalysis specific gravity 1.020, 6-10 white cells, 0-5 RBCs, urine osmolality 853, EKG left axis deviation, sinus tachycardia 137 bpm, normal intervals.  Patient was admitted to the hospital with working diagnosis of dehydration complicated by hypernatremia and hypokalemia.    Assessment & Plan:   Principal Problem:   Schizophrenia (HCC) Active Problems:   Dehydration   Hypernatremia   UTI (urinary tract infection)   Acute lower UTI   AKI (acute kidney injury) (HCC)   Hypokalemia   1. Hypernatremia, hypokalemia and hypomagnesemia due to dehydration and poor oral intake. Patient continue to have poor oral intake, will resume IV fluids with d5 and half normal saline at 75 ml per hour, will follow on renal function and electrolytes in am, continue to encourage po intake. Renal function preserved with serum cr at 0.87. Will give 80 meq kcl, 40 IV and 40 po, plus 2 grams of magnesium sulfate.   2. Uncontrolled HTN.  Systolic blood pressure 170 to 180 mmHg, patient has been refusing po amlodipine, continue as needed hydralazine.   3. Schizophrenia and paranoia. Will continue sitter at the beside, involuntary commitment in case of threatening to leave the hospital, continue benztropine (refused), fluoxetine (refused), as needed lorazepam and olanzapine at night (received 04/25).  4. Positive urine culture for pseudomonas. Suspected contamination, will discontinue antibiotic therapy for now.    DVT prophylaxis: enoxaparin   Code Status: full Family Communication: no family at the beside  Disposition Plan:  Psych unit when medically stable Transfer to medical unit and discontinue telemetry monitoring   Consultants:   Psychiatry  Procedures:     Antimicrobials:       Subjective: Patient is feeling well this am, no nausea or vomiting, no chest pain or dyspnea. I explained the importance of eating and taking prescribed medications.   Objective: Vitals:   07/21/17 0329 07/21/17 0400 07/21/17 0500 07/21/17 0600  BP:  (!) 141/64  (!) 170/73  Pulse:      Resp:  14 15 20   Temp: 99 F (37.2 C)     TempSrc: Oral     SpO2:      Weight:   81.7 kg (180 lb 1.9 oz)   Height:        Intake/Output Summary (Last 24 hours) at 07/21/2017 0752 Last data filed at 07/21/2017 0500 Gross per 24 hour  Intake 450 ml  Output 2050 ml  Net -1600 ml   Filed Weights   07/19/17 1800 07/21/17 0500  Weight: 79.4 kg (175 lb 0.7 oz) 81.7 kg (180 lb 1.9 oz)    Examination:   General: Not in pain  or dyspnea, deconditioned Neurology: Awake and alert, non focal  E ENT: positive pallor, no icterus, oral mucosa dry Cardiovascular: No JVD. S1-S2 present, rhythmic, no gallops, rubs, or murmurs. Non pitting lower extremity edema. Pulmonary: vesicular breath sounds bilaterally, adequate air movement, no wheezing, rhonchi or rales. Gastrointestinal. Abdomen with no organomegaly, non tender, no rebound or  guarding Skin. No rashes Musculoskeletal: no joint deformities     Data Reviewed: I have personally reviewed following labs and imaging studies  CBC: Recent Labs  Lab 07/15/17 1532 07/17/17 1727 07/19/17 0053 07/19/17 2046 07/20/17 0311  WBC 7.8  --  6.4 6.7 5.9  NEUTROABS 5.1  --  3.8  --   --   HGB 15.0 15.0 13.0 12.3 11.9*  HCT 46.8* 44.0 41.3 39.1 37.6  MCV 91.1  --  91.6 90.7 90.2  PLT 302  --  334 289 302   Basic Metabolic Panel: Recent Labs  Lab 07/15/17 1532 07/17/17 1727 07/19/17 0053 07/19/17 1914 07/20/17 0311 07/21/17 0318  NA 148* 152* 155* 147* 141 143  K 3.2* 3.3* 3.2* 3.7 2.5* 3.1*  CL 109 109 115* 113* 108 107  CO2 25  --  28 22 25 24   GLUCOSE 87 159* 105* 91 153* 100*  BUN 32* 55* 40* 19 15 11   CREATININE 0.87 1.20* 0.94 0.71 0.65 0.87  CALCIUM 9.6  --  9.4 8.5* 8.1* 8.8*  MG 2.4  --  2.2 1.6*  --   --   PHOS  --   --  3.4  --   --   --    GFR: Estimated Creatinine Clearance: 77.6 mL/min (by C-G formula based on SCr of 0.87 mg/dL). Liver Function Tests: Recent Labs  Lab 07/15/17 1532 07/19/17 0053 07/20/17 0311  AST 22 31 22   ALT 26 35 28  ALKPHOS 79 66 57  BILITOT 0.7 0.6 0.7  PROT 8.4* 7.6 6.4*  ALBUMIN 4.2 3.7 3.2*   No results for input(s): LIPASE, AMYLASE in the last 168 hours. No results for input(s): AMMONIA in the last 168 hours. Coagulation Profile: No results for input(s): INR, PROTIME in the last 168 hours. Cardiac Enzymes: Recent Labs  Lab 07/15/17 1532 07/19/17 0053  CKTOTAL 54  --   TROPONINI  --  <0.03   BNP (last 3 results) No results for input(s): PROBNP in the last 8760 hours. HbA1C: No results for input(s): HGBA1C in the last 72 hours. CBG: No results for input(s): GLUCAP in the last 168 hours. Lipid Profile: Recent Labs    07/19/17 1914  CHOL 128  HDL 37*  LDLCALC 77  TRIG 68  CHOLHDL 3.5   Thyroid Function Tests: Recent Labs    07/19/17 1914  TSH 1.678   Anemia Panel: No results for  input(s): VITAMINB12, FOLATE, FERRITIN, TIBC, IRON, RETICCTPCT in the last 72 hours.    Radiology Studies: I have reviewed all of the imaging during this hospital visit personally     Scheduled Meds: . amLODipine  5 mg Oral Daily  . aspirin EC  81 mg Oral Daily  . benztropine  1 mg Oral Daily  . Chlorhexidine Gluconate Cloth  6 each Topical Q0600  . enoxaparin (LOVENOX) injection  40 mg Subcutaneous Q24H  . FLUoxetine  40 mg Oral Daily  . fosfomycin  3 g Oral Once  . methocarbamol  500 mg Oral QID  . mupirocin ointment  1 application Nasal BID  . OLANZapine  15 mg Oral QHS  . potassium chloride  10 mEq Oral Daily   Continuous Infusions: . ceFEPime (MAXIPIME) IV Stopped (07/21/17 0410)     LOS: 2 days        Kutler Vanvranken Annett Gula, MD [facility] Pager [phone]

## 2017-07-22 LAB — BASIC METABOLIC PANEL
Anion gap: 11 (ref 5–15)
BUN: 14 mg/dL (ref 6–20)
CO2: 24 mmol/L (ref 22–32)
Calcium: 8.9 mg/dL (ref 8.9–10.3)
Chloride: 108 mmol/L (ref 101–111)
Creatinine, Ser: 1.02 mg/dL — ABNORMAL HIGH (ref 0.44–1.00)
GFR calc Af Amer: >60 mL/min (ref >60)
GFR calc non Af Amer: 59 mL/min — ABNORMAL LOW (ref >60)
Glucose, Bld: 116 mg/dL — ABNORMAL HIGH (ref 65–99)
Potassium: 3.4 mmol/L — ABNORMAL LOW (ref 3.5–5.1)
Sodium: 143 mmol/L (ref 135–145)

## 2017-07-22 LAB — MAGNESIUM: Magnesium: 2.2 mg/dL (ref 1.7–2.4)

## 2017-07-22 MED ORDER — POTASSIUM CHLORIDE 10 MEQ/100ML IV SOLN
10.0000 meq | INTRAVENOUS | Status: AC
  Administered 2017-07-22 (×4): 10 meq via INTRAVENOUS
  Filled 2017-07-22 (×3): qty 100

## 2017-07-22 NOTE — Progress Notes (Signed)
PROGRESS NOTE    Amy Casey  ZOX:[id number] DOB: [birth year] DOA: 07/19/2017 PCP: Jac Canavan, PA-C    Brief Narrative:  57 year old female who presented with significant decreased p.o. intake and dehydration.  She does have a significant past medical history for hypertension, hypokalemia and schizophrenia.  [facility], and transferred to the inpatient unit due to worsening p.o. intake and hypernatremia.  On the initial physical examination blood pressure was 165/91, heart rate 82, temperature 97.9, respiratory rate 19, oxygen saturation 100%.  Dry mucous membranes, lungs were clear to auscultation bilaterally, heart S1-S2 present and rhythmic, the abdomen was soft nontender, no lower extremity edema.  Sodium 155, potassium 3.2, chloride 115, bicarb 28, glucose 105, BUN 40, creatinine 0.95, phosphorus 3.4, magnesium 2.2, AST 31, ALT 35, serum osmolality 329, white count 6.4, hemoglobin 13.0, hematocrit 41.3, platelets 334.  Urinalysis specific gravity 1.020, 6-10 white cells, 0-5 RBCs, urine osmolality 853, EKG left axis deviation, sinus tachycardia 137 bpm, normal intervals.  Patient was admitted to the hospital with working diagnosis of dehydration complicated by hypernatremia and hypokalemia.    Assessment & Plan:   Principal Problem:   Schizophrenia (HCC) Active Problems:   Dehydration   Hypernatremia   UTI (urinary tract infection)   Acute lower UTI   AKI (acute kidney injury) (HCC)   Hypokalemia    1. Hypernatremia, hypokalemia and hypomagnesemia due to dehydration and poor oral intake. Very poor oral intake, patient refusing to eat, continue IV fluids with d5 and half normal saline at 75 ml per hour. NA at 143, K at 3.4 and serum bicarbonate at 3.4. Mag at 2,.2. Will give kcl 40 IV and follow on renal panel in am.   2. Uncontrolled HTN. Patient continue to decline oral medications, systolic blood pressure 120 to 130 mmHg,  orderd amlodipine, and as needed IV hydralazine.   3. Schizophrenia and paranoia. One to one sitter at the beside, prescribed benztropine (rrecived 04/27), fluoxetine (refused), as needed lorazepam and olanzapine at night (received 04/26). Will need psychiatric facility at discharge.   4. Positive urine culture for pseudomonas/ contamination. No antibiotic therapy indicated.     DVT prophylaxis: enoxaparin   Code Status: full Family Communication: no family at the beside  Disposition Plan:  Psych unit when medically stable Transfer to medical unit and discontinue telemetry monitoring   Consultants:   Psychiatry  Procedures:     Antimicrobials:       Subjective: Patient continue to have very poor oral intake, has been refusing her medications, but took olanzapine last night, no nausea or vomiting, no chest pain or dyspnea, poorly interactive.   Objective: Vitals:   07/21/17 1000 07/21/17 1200 07/21/17 2212 07/22/17 0610  BP: (!) 161/98  113/86 134/79  Pulse:   81 87  Resp: (!) Temp:  100.2 F (37.9 C) 98.6 F (37 C) 97.6 F (36.4 C)  TempSrc:  Oral Oral Oral  SpO2:   100% 99%  Weight:    79.8 kg (175 lb 14.8 oz)  Height:        Intake/Output Summary (Last 24 hours) at 07/22/2017 1133 Last data filed at 07/22/2017 0002 Gross per 24 hour  Intake 821.25 ml  Output 70 ml  Net 751.25 ml   Filed Weights   07/19/17 1800 07/21/17 0500 07/22/17 0610  Weight: 79.4 kg (175 lb 0.7 oz) 81.7 kg (180 lb 1.9 oz) 79.8 kg (175 lb 14.8 oz)  Examination:   General: deconditioned.  Neurology: Awake and alert, not cooperative.  E ENT: mild pallor, no icterus, oral mucosa dry.  Cardiovascular: No JVD. S1-S2 present, rhythmic, no gallops, rubs, or murmurs. No lower extremity edema. Pulmonary: vesicular breath sounds bilaterally, adequate air movement, no wheezing, rhonchi or rales. Gastrointestinal. Abdomen flat, no organomegaly, non tender, no rebound or  guarding Skin. No rashes Musculoskeletal: no joint deformities     Data Reviewed: I have personally reviewed following labs and imaging studies  CBC: Recent Labs  Lab 07/15/17 1532 07/17/17 1727 07/19/17 0053 07/19/17 2046 07/20/17 0311  WBC 7.8  --  6.4 6.7 5.9  NEUTROABS 5.1  --  3.8  --   --   HGB 15.0 15.0 13.0 12.3 11.9*  HCT 46.8* 44.0 41.3 39.1 37.6  MCV 91.1  --  91.6 90.7 90.2  PLT 302  --  334 289 302   Basic Metabolic Panel: Recent Labs  Lab 07/15/17 1532  07/19/17 0053 07/19/17 1914 07/20/17 0311 07/21/17 0318 07/22/17 0523  NA 148*   < > 155* 147* 141 143 143  K 3.2*   < > 3.2* 3.7 2.5* 3.1* 3.4*  CL 109   < > 115* 113* 108 107 108  CO2 25  --  GLUCOSE 87   < > 105* 91 153* 100* 116*  BUN 32*   < > 40* CREATININE 0.87   < > 0.94 0.71 0.65 0.87 1.02*  CALCIUM 9.6  --  9.4 8.5* 8.1* 8.8* 8.9  MG 2.4  --  2.2 1.6*  --   --  2.2  PHOS  --   --  3.4  --   --   --   --    < > = values in this interval not displayed.   GFR: Estimated Creatinine Clearance: 66.2 mL/min (A) (by C-G formula based on SCr of 1.02 mg/dL (H)). Liver Function Tests: Recent Labs  Lab 07/15/17 1532 07/19/17 0053 07/20/17 0311  AST ALT 26 35 28  ALKPHOS 79 66 57  BILITOT 0.7 0.6 0.7  PROT 8.4* 7.6 6.4*  ALBUMIN 4.2 3.7 3.2*   No results for input(s): LIPASE, AMYLASE in the last 168 hours. No results for input(s): AMMONIA in the last 168 hours. Coagulation Profile: No results for input(s): INR, PROTIME in the last 168 hours. Cardiac Enzymes: Recent Labs  Lab 07/15/17 1532 07/19/17 0053  CKTOTAL 54  --   TROPONINI  --  <0.03   BNP (last 3 results) No results for input(s): PROBNP in the last 8760 hours. HbA1C: No results for input(s): HGBA1C in the last 72 hours. CBG: No results for input(s): GLUCAP in the last 168 hours. Lipid Profile: Recent Labs    07/19/17 1914  CHOL 128  HDL 37*  LDLCALC 77  TRIG 68  CHOLHDL 3.5     Thyroid Function Tests: Recent Labs    07/19/17 1914  TSH 1.678   Anemia Panel: No results for input(s): VITAMINB12, FOLATE, FERRITIN, TIBC, IRON, RETICCTPCT in the last 72 hours.    Radiology Studies: I have reviewed all of the imaging during this hospital visit personally     Scheduled Meds: . amLODipine  5 mg Oral Daily  . aspirin EC  81 mg Oral Daily  . benztropine  1 mg Oral Daily  . Chlorhexidine Gluconate Cloth  6 each Topical Q0600  . enoxaparin (LOVENOX) injection  40  mg Subcutaneous Q24H  . FLUoxetine  40 mg Oral Daily  . mouth rinse  15 mL Mouth Rinse BID  . methocarbamol  500 mg Oral QID  . mupirocin ointment  1 application Nasal BID  . OLANZapine  15 mg Oral QHS  . potassium chloride  40 mEq Oral Once   Continuous Infusions: . dextrose 5 % and 0.45 % NaCl with KCl 20 mEq/L 75 mL/hr at 07/21/17 1741     LOS: 3 days        Kenyette Gundy Annett Gula, MD [facility] Pager [phone]

## 2017-07-23 LAB — BASIC METABOLIC PANEL
Anion gap: 9 (ref 5–15)
BUN: 15 mg/dL (ref 6–20)
CO2: 24 mmol/L (ref 22–32)
Calcium: 9.2 mg/dL (ref 8.9–10.3)
Chloride: 107 mmol/L (ref 101–111)
Creatinine, Ser: 0.71 mg/dL (ref 0.44–1.00)
GFR calc Af Amer: >60 mL/min (ref >60)
GFR calc non Af Amer: >60 mL/min (ref >60)
Glucose, Bld: 129 mg/dL — ABNORMAL HIGH (ref 65–99)
Potassium: 3.5 mmol/L (ref 3.5–5.1)
Sodium: 140 mmol/L (ref 135–145)

## 2017-07-23 MED ORDER — POTASSIUM CHLORIDE 10 MEQ/100ML IV SOLN
10.0000 meq | INTRAVENOUS | Status: AC
  Administered 2017-07-23 (×4): 10 meq via INTRAVENOUS
  Filled 2017-07-23 (×4): qty 100

## 2017-07-23 NOTE — Progress Notes (Signed)
PROGRESS NOTE    Amy Casey  ZOX:[id number] DOB: April 21, 1960 DOA: 07/19/2017 PCP: Jac Canavan, PA-C    Brief Narrative:  57 year old female who presented with significant decreased p.o. intake and dehydration.She does have a significant past medical history for hypertension, hypokalemia and schizophrenia. [facility],and transferred to the inpatient unit due to worsening p.o. intake and hypernatremia.On the initial physical examination blood pressure was 165/91, heart rate 82, temperature 97.9, respiratory rate 19, oxygen saturation 100%.Dry mucous membranes, lungs wereclear to auscultation bilaterally, heart S1-S2 present and rhythmic, the abdomen was soft nontender, no lower extremity edema. Sodium 155, potassium 3.2, chloride 115, bicarb 28, glucose 105, BUN 40, creatinine 0.95, phosphorus 3.4, magnesium 2.2, AST 31, ALT 35,serum osmolality 329,white count 6.4, hemoglobin 13.0, hematocrit 41.3, platelets 334.Urinalysis specific gravity 1.020, 6-10 white cells, 0-5 RBCs,urine osmolality 853,EKG left axis deviation, sinus tachycardia 137 bpm,normal intervals.  Patient was admitted to the hospital with working diagnosis of dehydration complicated by hypernatremia and hypokalemia.   Assessment & Plan:   Principal Problem:   Schizophrenia (HCC) Active Problems:   Dehydration   Hypernatremia   UTI (urinary tract infection)   Acute lower UTI   AKI (acute kidney injury) (HCC)   Hypokalemia   1. Hypernatremia, hypokalemia and hypomagnesemia due to dehydration and poor oral intake. patient requesting regular diet. Clinically well hydrated, will hold on IV fluids. Continue k correction with kcl, Na at 140 and serum bicarbonate at 24.   2. HTN. Blood pressure 139 to 144 systolic, patient accepted amlodipine today.   3. Schizophrenia and paranoia. Continue with one to one sitter at the beside. Has has bee accepting pm  olanzapine. Today has received lorazepam, fluoxetine, and methocarbamol.   4. Positive urine culture for pseudomonas/ contamination. Ruled out urine infection  DVT prophylaxis:enoxaparin Code Status:full Family Communication:no family at the beside Disposition Plan:Psych unit when medically stable.    Consultants:  Psychiatry  Procedures:    Antimicrobials:       Subjective: Patient continue to have poor oral intake, no nausea or vomiting, accepting some medications, no chest pain or dyspnea.   Objective: Vitals:   07/23/17 0608 07/23/17 0615 07/23/17 1025 07/23/17 1037  BP: (!) 149/96 (!) 137/93 (!) 153/90 (!) 144/82  Pulse: (!) 105 (!) 101 (!) 104 77  Resp: Temp: 97.6 F (36.4 C) 97.6 F (36.4 C) 97.8 F (36.6 C)   TempSrc: Oral Oral Oral   SpO2:   100%   Weight:      Height:        Intake/Output Summary (Last 24 hours) at 07/23/2017 1125 Last data filed at 07/23/2017 0622 Gross per 24 hour  Intake 750 ml  Output -  Net 750 ml   Filed Weights   07/21/17 0500 07/22/17 0610 07/23/17 0508  Weight: 81.7 kg (180 lb 1.9 oz) 79.8 kg (175 lb 14.8 oz) 81.8 kg (180 lb 5.4 oz)    Examination:   General: deconditioned  Neurology: Awake and alert, non focal, responds to simple questions.   E ENT: no pallor, no icterus, oral mucosa moist Cardiovascular: No JVD. S1-S2 present, rhythmic, no gallops, rubs, or murmurs. No lower extremity edema. Pulmonary: vesicular breath sounds bilaterally, adequate air movement, no wheezing, rhonchi or rales. Gastrointestinal. Abdomen with no organomegaly, non tender, no rebound or guarding Skin. No rashes Musculoskeletal: no joint deformities     Data Reviewed: I have personally reviewed following labs and imaging studies  CBC:  Recent Labs  Lab 07/17/17 1727 07/19/17 0053 07/19/17 2046 07/20/17 0311  WBC  --  6.4 6.7 5.9  NEUTROABS  --  3.8  --   --   HGB 15.0 13.0 12.3 11.9*  HCT 44.0  41.3 39.1 37.6  MCV  --  91.6 90.7 90.2  PLT  --  334 289 302   Basic Metabolic Panel: Recent Labs  Lab 07/19/17 0053 07/19/17 1914 07/20/17 0311 07/21/17 0318 07/22/17 0523 07/23/17 0613  NA 155* 147* 141 143 143 140  K 3.2* 3.7 2.5* 3.1* 3.4* 3.5  CL 115* 113* 108 107 108 107  CO2 GLUCOSE 105* 91 153* 100* 116* 129*  BUN 40* CREATININE 0.94 0.71 0.65 0.87 1.02* 0.71  CALCIUM 9.4 8.5* 8.1* 8.8* 8.9 9.2  MG 2.2 1.6*  --   --  2.2  --   PHOS 3.4  --   --   --   --   --    GFR: Estimated Creatinine Clearance: 84.3 mL/min (by C-G formula based on SCr of 0.71 mg/dL). Liver Function Tests: Recent Labs  Lab 07/19/17 0053 07/20/17 0311  AST 31 22  ALT 35 28  ALKPHOS 66 57  BILITOT 0.6 0.7  PROT 7.6 6.4*  ALBUMIN 3.7 3.2*   No results for input(s): LIPASE, AMYLASE in the last 168 hours. No results for input(s): AMMONIA in the last 168 hours. Coagulation Profile: No results for input(s): INR, PROTIME in the last 168 hours. Cardiac Enzymes: Recent Labs  Lab 07/19/17 0053  TROPONINI <0.03   BNP (last 3 results) No results for input(s): PROBNP in the last 8760 hours. HbA1C: No results for input(s): HGBA1C in the last 72 hours. CBG: No results for input(s): GLUCAP in the last 168 hours. Lipid Profile: No results for input(s): CHOL, HDL, LDLCALC, TRIG, CHOLHDL, LDLDIRECT in the last 72 hours. Thyroid Function Tests: No results for input(s): TSH, T4TOTAL, FREET4, T3FREE, THYROIDAB in the last 72 hours. Anemia Panel: No results for input(s): VITAMINB12, FOLATE, FERRITIN, TIBC, IRON, RETICCTPCT in the last 72 hours.    Radiology Studies: I have reviewed all of the imaging during this hospital visit personally     Scheduled Meds: . amLODipine  5 mg Oral Daily  . aspirin EC  81 mg Oral Daily  . benztropine  1 mg Oral Daily  . Chlorhexidine Gluconate Cloth  6 each Topical Q0600  . enoxaparin (LOVENOX) injection  40 mg  Subcutaneous Q24H  . FLUoxetine  40 mg Oral Daily  . mouth rinse  15 mL Mouth Rinse BID  . methocarbamol  500 mg Oral QID  . mupirocin ointment  1 application Nasal BID  . OLANZapine  15 mg Oral QHS  . potassium chloride  40 mEq Oral Once   Continuous Infusions: . dextrose 5 % and 0.45 % NaCl with KCl 20 mEq/L 75 mL/hr at 07/22/17 1336     LOS: 4 days        Mauricio Annett Gula, MD [facility] Pager [phone]

## 2017-07-23 NOTE — Progress Notes (Signed)
Pt has active IVC paperwork in chart. Order for Recruitment consultant for IVC. No sitter was assigned to pt this AM. On coming RN and ChargeRN have pulled NT to sit w/ pt. Will continue to monitor as ordered by physician.

## 2017-07-24 DIAGNOSIS — Z62810 Personal history of physical and sexual abuse in childhood: Secondary | ICD-10-CM | POA: Diagnosis not present

## 2017-07-24 DIAGNOSIS — Z9114 Patient's other noncompliance with medication regimen: Secondary | ICD-10-CM | POA: Diagnosis not present

## 2017-07-24 DIAGNOSIS — F200 Paranoid schizophrenia: Secondary | ICD-10-CM | POA: Diagnosis not present

## 2017-07-24 DIAGNOSIS — I10 Essential (primary) hypertension: Secondary | ICD-10-CM | POA: Diagnosis not present

## 2017-07-24 DIAGNOSIS — Z915 Personal history of self-harm: Secondary | ICD-10-CM | POA: Diagnosis not present

## 2017-07-24 LAB — BASIC METABOLIC PANEL
Anion gap: 8 (ref 5–15)
BUN: 19 mg/dL (ref 6–20)
CO2: 24 mmol/L (ref 22–32)
Calcium: 9.3 mg/dL (ref 8.9–10.3)
Chloride: 107 mmol/L (ref 101–111)
Creatinine, Ser: 1.01 mg/dL — ABNORMAL HIGH (ref 0.44–1.00)
GFR calc Af Amer: >60 mL/min (ref >60)
GFR calc non Af Amer: >60 mL/min (ref >60)
Glucose, Bld: 103 mg/dL — ABNORMAL HIGH (ref 65–99)
Potassium: 4.2 mmol/L (ref 3.5–5.1)
Sodium: 139 mmol/L (ref 135–145)

## 2017-07-24 MED ORDER — AMLODIPINE BESYLATE 5 MG PO TABS: 5.0000 mg | ORAL_TABLET | Freq: Every day | ORAL | 0 refills | Status: DC

## 2017-07-24 NOTE — Discharge Summary (Signed)
Physician Discharge Summary  Amy Casey ZOX:[id number] DOB: April 16, 1960 DOA: 07/19/2017  PCP: Jac Canavan, PA-C  Admit date: 07/19/2017 Discharge date: 07/24/2017  Admitted From: Psychiatric facility  Disposition:  Psychiatric facility   Recommendations for Outpatient Follow-up and new medication changes:  1. Follow up with PCP in 1- week 2. Patient has been accepting her medications and tolerating well regular diet.   Home Health: no  Equipment/Devices: no    Discharge Condition: stable  CODE STATUS: full  Diet recommendation: Regular diet   Brief/Interim Summary: 57 year old female who presented with significant decreased p.o. intake and dehydration.She does have a significant past medical history for hypertension, hypokalemia and schizophrenia. She was [facility] at [facility],and transferred to the inpatient unit due to worsening p.o. intake and hypernatremia.On the initial physical examination blood pressure was 165/91, heart rate 82, temperature 97.9, respiratory rate 19, oxygen saturation 100%.Dry mucous membranes, lungs wereclear to auscultation bilaterally, heart S1-S2 present and rhythmic, the abdomen was soft nontender, no lower extremity edema. Sodium 155, potassium 3.2, chloride 115, bicarb 28, glucose 105, BUN 40, creatinine 0.95, phosphorus 3.4, magnesium 2.2, AST 31, ALT 35,serum osmolality 329,white count 6.4, hemoglobin 13.0, hematocrit 41.3, platelets 334.Urinalysis specific gravity 1.020, 6-10 white cells, 0-5 RBCs,urine osmolality 853,EKG left axis deviation, sinus tachycardia 137 bpm,normal intervals.  Patient was admitted to the hospital with working diagnosis of dehydration complicated by hypernatremia and hypokalemia.  1.  Hypernatremia, hypokalemia and hypomagnesemia due to dehydration and poor oral intake.  Patient was admitted to the medical ward, she was placed on IV fluids with improvement of electrolytes.  She  received antipsychotics with improvement of her p.o. intake.  She has been off IV fluids for the last 24 hours, her discharge sodium is 139, potassium 4.2, serum bicarbonate 24, BUN 19 and creatinine 1.01.  Recommend to continue regular diet for now.  2.  Schizophrenia with paranoia.  Patient was seen by psychiatry, and had a one-to-one sitter.  Slowly improved her willingness to eat, she was treated with lorazepam, fluoxetine, and olanzapine.  Initially she refused oral medications but over the last 48 hours she has been compliant with medical therapy.   3.  Hypertension.  Patient was continued on amlodipine with good blood pressure control.  4.  Positive urinary culture for Pseudomonas/sample contamination.  Urinary tract infection was ruled out, no antibiotic therapy indicated.    Discharge Diagnoses:  Principal Problem:   Schizophrenia (HCC) Active Problems:   Dehydration   Hypernatremia   UTI (urinary tract infection)   Acute lower UTI   AKI (acute kidney injury) (HCC)   Hypokalemia    Discharge Instructions   Allergies as of 07/24/2017      Reactions   Paliperidone    Angioedema, drooling, slurred speech, ptosis   Sulfa Antibiotics    Lisinopril Rash   Angioedema      Medication List    STOP taking these medications   aspirin EC 81 MG tablet   potassium chloride 10 MEQ tablet Commonly known as:  K-DUR     TAKE these medications   amLODipine 5 MG tablet Commonly known as:  NORVASC Take 1 tablet (5 mg total) by mouth daily. Start taking on:  07/25/2017 What changed:    medication strength  how much to take   benztropine 1 MG tablet Commonly known as:  COGENTIN Take 1 tablet (1 mg total) by mouth daily.   FLUoxetine 40 MG capsule Commonly known as:  PROZAC Take 1 capsule (40 mg  total) by mouth daily.   LORazepam 0.5 MG tablet Commonly known as:  ATIVAN Take 0.5 mg by mouth daily as needed.   methocarbamol 500 MG tablet Commonly known as:   ROBAXIN Take 1 tablet (500 mg total) by mouth 4 (four) times daily.   OLANZapine 15 MG tablet Commonly known as:  ZYPREXA Take 15 mg by mouth at bedtime.       Allergies  Allergen Reactions  . Paliperidone     Angioedema, drooling, slurred speech, ptosis  . Sulfa Antibiotics   . Lisinopril Rash    Angioedema     Consultations:  Psychiatry    Procedures/Studies:  No results found.    Subjective: Patient is feeling better, tolerating po well, no nausea or vomiting, no abdominal or chest pain.   Discharge Exam: Vitals:   07/23/17 2113 07/24/17 0500  BP: 95/63 126/65  Pulse: 80 84  Resp: 16 16  Temp: 97.9 F (36.6 C) 98.4 F (36.9 C)  SpO2: 100% 99%   Vitals:   07/23/17 1037 07/23/17 1435 07/23/17 2113 07/24/17 0500  BP: (!) 144/82 139/87 95/63 126/65  Pulse: 77 84 80 84  Resp:  Temp:  97.9 F (36.6 C) 97.9 F (36.6 C) 98.4 F (36.9 C)  TempSrc:  Oral Oral Oral  SpO2:  100% 100% 99%  Weight:    82.6 kg (182 lb 1.6 oz)  Height:        General: Not in pain or dyspnea Neurology: Awake and alert, non focal  E ENT: no pallor, no icterus, oral mucosa moist Cardiovascular: No JVD. S1-S2 present, rhythmic, no gallops, rubs, or murmurs. No lower extremity edema. Pulmonary: vesicular breath sounds bilaterally, adequate air movement, no wheezing, rhonchi or rales. Gastrointestinal. Abdomen flat, no organomegaly, non tender, no rebound or guarding Skin. No rashes Musculoskeletal: no joint deformities   The results of significant diagnostics from this hospitalization (including imaging, microbiology, ancillary and laboratory) are listed below for reference.     Microbiology: Recent Results (from the past 240 hour(s))  Urine Culture     Status: Abnormal   Collection Time: 07/16/17  9:46 AM  Result Value Ref Range Status   Specimen Description URINE, CLEAN CATCH  Final   Special Requests NONE  Final   Culture (A)  Final    >=100,000 COLONIES/mL  PSEUDOMONAS AERUGINOSA 80,000 COLONIES/mL DIPHTHEROIDS(CORYNEBACTERIUM SPECIES) Standardized susceptibility testing for this organism is not available. Performed at [facility], [address], [place], [place] [address]    Report Status 07/18/2017 FINAL  Final   Organism ID, Bacteria PSEUDOMONAS AERUGINOSA (A)  Final      Susceptibility   Pseudomonas aeruginosa - MIC*    CEFTAZIDIME 4 SENSITIVE Sensitive     CIPROFLOXACIN <=0.25 SENSITIVE Sensitive     GENTAMICIN 2 SENSITIVE Sensitive     IMIPENEM 2 SENSITIVE Sensitive     PIP/TAZO 8 SENSITIVE Sensitive     CEFEPIME 2 SENSITIVE Sensitive     * >=100,000 COLONIES/mL PSEUDOMONAS AERUGINOSA  Culture, Urine     Status: None   Collection Time: 07/19/17 12:53 AM  Result Value Ref Range Status   Specimen Description   Final    URINE, CLEAN CATCH Performed at [facility], [address], [place], [place] [address]    Special Requests   Final    NONE Performed at [facility], [address] [place], [place] [address]    Culture   Final  NO GROWTH Performed at [facility], [address], [place], [place] [address]    Report Status 07/20/2017 FINAL  Final  MRSA PCR Screening     Status: Abnormal   Collection Time: 07/19/17  6:18 PM  Result Value Ref Range Status   MRSA by PCR POSITIVE (A) NEGATIVE Final    Comment:        The GeneXpert MRSA Assay (FDA approved for NASAL specimens only), is one component of a comprehensive MRSA colonization surveillance program. It is not intended to diagnose MRSA infection nor to guide or monitor treatment for MRSA infections. RESULT CALLED TO, READ BACK BY AND VERIFIED WITH: K WILLING,RN  07/19/17 MKELLY Performed at [facility], [address] [place], [place] [address]      Labs: BNP (last 3 results) No results for input(s): BNP in the last 8760 hours. Basic Metabolic Panel: Recent Labs  Lab  07/19/17 0053 07/19/17 1914 07/20/17 0311 07/21/17 0318 07/22/17 0523 07/23/17 0613 07/24/17 0603  NA 155* 147* 141 143 143 140 139  K 3.2* 3.7 2.5* 3.1* 3.4* 3.5 4.2  CL 115* 113* 108 107 108 107 107  CO2 GLUCOSE 105* 91 153* 100* 116* 129* 103*  BUN 40* CREATININE 0.94 0.71 0.65 0.87 1.02* 0.71 1.01*  CALCIUM 9.4 8.5* 8.1* 8.8* 8.9 9.2 9.3  MG 2.2 1.6*  --   --  2.2  --   --   PHOS 3.4  --   --   --   --   --   --    Liver Function Tests: Recent Labs  Lab 07/19/17 0053 07/20/17 0311  AST 31 22  ALT 35 28  ALKPHOS 66 57  BILITOT 0.6 0.7  PROT 7.6 6.4*  ALBUMIN 3.7 3.2*   No results for input(s): LIPASE, AMYLASE in the last 168 hours. No results for input(s): AMMONIA in the last 168 hours. CBC: Recent Labs  Lab 07/17/17 1727 07/19/17 0053 07/19/17 2046 07/20/17 0311  WBC  --  6.4 6.7 5.9  NEUTROABS  --  3.8  --   --   HGB 15.0 13.0 12.3 11.9*  HCT 44.0 41.3 39.1 37.6  MCV  --  91.6 90.7 90.2  PLT  --  334 289 302   Cardiac Enzymes: Recent Labs  Lab 07/19/17 0053  TROPONINI <0.03   BNP: Invalid input(s): POCBNP CBG: No results for input(s): GLUCAP in the last 168 hours. D-Dimer No results for input(s): DDIMER in the last 72 hours. Hgb A1c No results for input(s): HGBA1C in the last 72 hours. Lipid Profile No results for input(s): CHOL, HDL, LDLCALC, TRIG, CHOLHDL, LDLDIRECT in the last 72 hours. Thyroid function studies No results for input(s): TSH, T4TOTAL, T3FREE, THYROIDAB in the last 72 hours.  Invalid input(s): FREET3 Anemia work up No results for input(s): VITAMINB12, FOLATE, FERRITIN, TIBC, IRON, RETICCTPCT in the last 72 hours. Urinalysis    Component Value Date/Time   COLORURINE YELLOW 07/19/2017 0053   APPEARANCEUR HAZY (A) 07/19/2017 0053   LABSPEC 1.020 07/19/2017 0053   PHURINE 5.0 07/19/2017 0053   GLUCOSEU NEGATIVE 07/19/2017 0053   HGBUR SMALL (A) 07/19/2017 0053   BILIRUBINUR  NEGATIVE 07/19/2017 0053   BILIRUBINUR n 06/02/2016 1251   KETONESUR 20 (A) 07/19/2017 0053   PROTEINUR NEGATIVE 07/19/2017 0053   UROBILINOGEN negative 06/02/2016 1251   UROBILINOGEN 1.0 02/19/2014 1849   NITRITE NEGATIVE  07/19/2017 0053   LEUKOCYTESUR MODERATE (A) 07/19/2017 0053   Sepsis Labs Invalid input(s): PROCALCITONIN,  WBC,  LACTICIDVEN Microbiology Recent Results (from the past 240 hour(s))  Urine Culture     Status: Abnormal   Collection Time: 07/16/17  9:46 AM  Result Value Ref Range Status   Specimen Description URINE, CLEAN CATCH  Final   Special Requests NONE  Final   Culture (A)  Final    >=100,000 COLONIES/mL PSEUDOMONAS AERUGINOSA 80,000 COLONIES/mL DIPHTHEROIDS(CORYNEBACTERIUM SPECIES) Standardized susceptibility testing for this organism is not available. Performed at [facility], [address] [place], [place] [address]    Report Status 07/18/2017 FINAL  Final   Organism ID, Bacteria PSEUDOMONAS AERUGINOSA (A)  Final      Susceptibility   Pseudomonas aeruginosa - MIC*    CEFTAZIDIME 4 SENSITIVE Sensitive     CIPROFLOXACIN <=0.25 SENSITIVE Sensitive     GENTAMICIN 2 SENSITIVE Sensitive     IMIPENEM 2 SENSITIVE Sensitive     PIP/TAZO 8 SENSITIVE Sensitive     CEFEPIME 2 SENSITIVE Sensitive     * >=100,000 COLONIES/mL PSEUDOMONAS AERUGINOSA  Culture, Urine     Status: None   Collection Time: 07/19/17 12:53 AM  Result Value Ref Range Status   Specimen Description   Final    URINE, CLEAN CATCH Performed at [facility], [address] [place], [place] [address]    Special Requests   Final    NONE Performed at [facility], [address], [place], [place] [address]    Culture   Final    NO GROWTH Performed at [facility], [address], [place], [place] [address]    Report Status 07/20/2017 FINAL  Final  MRSA PCR Screening     Status: Abnormal   Collection Time: 07/19/17  6:18 PM  Result  Value Ref Range Status   MRSA by PCR POSITIVE (A) NEGATIVE Final    Comment:        The GeneXpert MRSA Assay (FDA approved for NASAL specimens only), is one component of a comprehensive MRSA colonization surveillance program. It is not intended to diagnose MRSA infection nor to guide or monitor treatment for MRSA infections. RESULT CALLED TO, READ BACK BY AND VERIFIED WITH: Claire Shown  07/19/17 MKELLY Performed at [facility], [address], [place], [place] [address]      Time coordinating discharge: 45 minutes  SIGNED:   Coralie Keens, MD  [facility] 07/24/2017, 11:15 AM Pager [phone]  If 7PM-7AM, please contact night-coverage [email or web address] Password [email or web address]

## 2017-07-24 NOTE — Progress Notes (Addendum)
LCSW following for inpatient psych placement.   Patient has bed at [facility].  Patient will transport by [facility].   Accepting MD: Estill Cotta  RN report number: [phone]  LCSW notified patient's spouse, [name], of transfer.   Beulah Gandy [place] CSW [phone]

## 2017-07-24 NOTE — Progress Notes (Addendum)
Patient has discharged to psych facility on 07/24/17. SW is notified. RN called to give a report to [employer] at 1409. No question at this time.

## 2017-07-24 NOTE — Progress Notes (Signed)
LCSW faxed patient out to psych facilities. Patient is under IVC expires 5/1 at 2:50pm.   [facility]- No beds  [facility]- Under review  [address]-  [place]-  [facility]-  [place]-  [place]-  [place]-  [place]-  [place]-   [facility]-  [facility]-

## 2017-07-25 ENCOUNTER — Telehealth: Payer: Self-pay | Admitting: Medical

## 2017-07-25 DIAGNOSIS — F200 Paranoid schizophrenia: Secondary | ICD-10-CM | POA: Diagnosis not present

## 2017-07-25 NOTE — Telephone Encounter (Signed)
Left message for pt to call. Needs a hospital follow up appt per discharge summary.

## 2017-07-26 DIAGNOSIS — F200 Paranoid schizophrenia: Secondary | ICD-10-CM | POA: Diagnosis not present

## 2017-07-27 DIAGNOSIS — F200 Paranoid schizophrenia: Secondary | ICD-10-CM | POA: Diagnosis not present

## 2017-07-28 DIAGNOSIS — F200 Paranoid schizophrenia: Secondary | ICD-10-CM | POA: Diagnosis not present

## 2017-07-29 DIAGNOSIS — F200 Paranoid schizophrenia: Secondary | ICD-10-CM | POA: Diagnosis not present

## 2017-07-31 DIAGNOSIS — F200 Paranoid schizophrenia: Secondary | ICD-10-CM | POA: Diagnosis not present

## 2017-08-01 DIAGNOSIS — F200 Paranoid schizophrenia: Secondary | ICD-10-CM | POA: Diagnosis not present

## 2017-08-03 DIAGNOSIS — F209 Schizophrenia, unspecified: Secondary | ICD-10-CM | POA: Diagnosis not present

## 2017-08-04 ENCOUNTER — Telehealth: Payer: Self-pay

## 2017-08-04 DIAGNOSIS — F250 Schizoaffective disorder, bipolar type: Secondary | ICD-10-CM | POA: Diagnosis not present

## 2017-08-04 NOTE — Telephone Encounter (Signed)
Left message on voicemail for patient to call back and schedule appointment per [facility].

## 2017-08-24 ENCOUNTER — Telehealth: Payer: Self-pay | Admitting: Medical

## 2017-08-24 NOTE — Telephone Encounter (Signed)
This is second request.  Needs hospital f/u visit.

## 2017-08-25 ENCOUNTER — Encounter: Payer: Self-pay | Admitting: Medical

## 2017-08-25 NOTE — Telephone Encounter (Signed)
This pt has been called multiple times and messages left. I will send her a letter and see if she responds to that.

## 2018-01-20 ENCOUNTER — Encounter (HOSPITAL_COMMUNITY): Payer: Self-pay

## 2018-01-20 ENCOUNTER — Other Ambulatory Visit: Payer: Self-pay

## 2018-01-20 ENCOUNTER — Emergency Department (HOSPITAL_COMMUNITY)
Admission: EM | Admit: 2018-01-20 | Discharge: 2018-01-20 | Disposition: A | Discharge status: Home / Self Care | Payer: BLUE CROSS/BLUE SHIELD | Attending: Emergency Medicine | Admitting: Emergency Medicine

## 2018-01-20 DIAGNOSIS — E876 Hypokalemia: Secondary | ICD-10-CM | POA: Insufficient documentation

## 2018-01-20 DIAGNOSIS — Z79899 Other long term (current) drug therapy: Secondary | ICD-10-CM | POA: Diagnosis not present

## 2018-01-20 DIAGNOSIS — I10 Essential (primary) hypertension: Secondary | ICD-10-CM | POA: Diagnosis not present

## 2018-01-20 DIAGNOSIS — E860 Dehydration: Secondary | ICD-10-CM | POA: Insufficient documentation

## 2018-01-20 DIAGNOSIS — R339 Retention of urine, unspecified: Secondary | ICD-10-CM | POA: Diagnosis not present

## 2018-01-20 LAB — COMPREHENSIVE METABOLIC PANEL
ALT: 31 U/L (ref 0–44)
AST: 30 U/L (ref 15–41)
Albumin: 4.3 g/dL (ref 3.5–5.0)
Alkaline Phosphatase: 66 U/L (ref 38–126)
Anion gap: 11 (ref 5–15)
BUN: 39 mg/dL — ABNORMAL HIGH (ref 6–20)
CO2: 30 mmol/L (ref 22–32)
Calcium: 9.5 mg/dL (ref 8.9–10.3)
Chloride: 106 mmol/L (ref 98–111)
Creatinine, Ser: 1.19 mg/dL — ABNORMAL HIGH (ref 0.44–1.00)
GFR calc Af Amer: 57 mL/min — ABNORMAL LOW (ref >60)
GFR calc non Af Amer: 49 mL/min — ABNORMAL LOW (ref >60)
Glucose, Bld: 107 mg/dL — ABNORMAL HIGH (ref 70–99)
Potassium: 2.6 mmol/L — CL (ref 3.5–5.1)
Sodium: 147 mmol/L — ABNORMAL HIGH (ref 135–145)
Total Bilirubin: 0.8 mg/dL (ref 0.3–1.2)
Total Protein: 7.6 g/dL (ref 6.5–8.1)

## 2018-01-20 LAB — CBC
HCT: 43.7 % (ref 36.0–46.0)
Hemoglobin: 13.4 g/dL (ref 12.0–15.0)
MCH: 28.1 pg (ref 26.0–34.0)
MCHC: 30.7 g/dL (ref 30.0–36.0)
MCV: 91.6 fL (ref 80.0–100.0)
Platelets: 357 10*3/uL (ref 150–400)
RBC: 4.77 MIL/uL (ref 3.87–5.11)
RDW: 13 % (ref 11.5–15.5)
WBC: 6.2 10*3/uL (ref 4.0–10.5)
nRBC: 0 % (ref 0.0–0.2)

## 2018-01-20 LAB — URINALYSIS, ROUTINE W REFLEX MICROSCOPIC
Bilirubin Urine: NEGATIVE
Glucose, UA: NEGATIVE mg/dL
Hgb urine dipstick: NEGATIVE
Ketones, ur: 5 mg/dL — AB
Leukocytes, UA: NEGATIVE
Nitrite: NEGATIVE
Protein, ur: NEGATIVE mg/dL
Specific Gravity, Urine: 1.019 (ref 1.005–1.030)
pH: 7 (ref 5.0–8.0)

## 2018-01-20 LAB — I-STAT CG4 LACTIC ACID, ED: Lactic Acid, Venous: 1.3 mmol/L (ref 0.5–1.9)

## 2018-01-20 MED ORDER — POTASSIUM CHLORIDE 10 MEQ/100ML IV SOLN
10.0000 meq | INTRAVENOUS | Status: AC
  Filled 2018-01-20 (×2): qty 100

## 2018-01-20 MED ORDER — POTASSIUM CHLORIDE ER 20 MEQ PO TBCR: 20.0000 meq | EXTENDED_RELEASE_TABLET | Freq: Every day | ORAL | 0 refills | Status: DC

## 2018-01-20 MED ORDER — SODIUM CHLORIDE 0.9 % IV BOLUS
1000.0000 mL | Freq: Once | INTRAVENOUS | Status: AC
  Administered 2018-01-20: 1000 mL via INTRAVENOUS

## 2018-01-20 MED ORDER — POTASSIUM CHLORIDE CRYS ER 20 MEQ PO TBCR
40.0000 meq | EXTENDED_RELEASE_TABLET | Freq: Once | ORAL | Status: AC
  Administered 2018-01-20: 40 meq via ORAL
  Filled 2018-01-20: qty 2

## 2018-01-20 MED ORDER — POTASSIUM CHLORIDE 10 MEQ/100ML IV SOLN
10.0000 meq | Freq: Once | INTRAVENOUS | Status: AC
  Administered 2018-01-20: 10 meq via INTRAVENOUS
  Filled 2018-01-20: qty 100

## 2018-01-20 NOTE — ED Notes (Signed)
Pt transported to xray 

## 2018-01-20 NOTE — ED Notes (Signed)
Three runs of potassium infused.  Patient assisted to go to bathroom.

## 2018-01-20 NOTE — ED Provider Notes (Signed)
[facility] EMERGENCY DEPARTMENT Provider Note   CSN: [id number] Arrival date & time: 01/20/18  1443     History   Chief Complaint Chief Complaint  Patient presents with  . Urinary Retention  . Altered Mental Status    HPI Amy Casey is a 57 y.o. female in for evaluation of abnormal urination.  Patient states for the past several months, she has been urinating abnormally.  She states sometimes she feels like she needs to pee, but is unable to do.  Other times she pees frequently.  She is concerned that she has a UTI.  Patient denies other complaints, including fevers, chills, nausea, vomiting, dysuria, or hematuria.  Patient denies chest pain, shortness of breath, abdominal pain, or abnormal bowel movements.  Patient's husband states that she has been acting more abnormal the past week.  He states there are moments where she is acting normally, and then other moments where she is very withdrawn with no energy.  He is concerned that she is not on the right psychiatric medications.  Patient states that she is eating and drinking well, but patient's husband states that she drinks almost no fluids, and does not eat very well.  Additional history obtained from chart review, patient with a history of schizophrenia, hypokalemia, and frequent evaluation for dehydration.  Dehydration is often secondary to poor intake due to her psychiatric illness.   HPI  Past Medical History:  Diagnosis Date  . Depression    Dr. Mila Homer, [facility]; hospitalization 07/2010  . History of echocardiogram 2011   [facility]  . History of renal stone   . Hypertension   . Hypokalemia   . Obesity   . Schizophrenia (HCC)   . Wears glasses     Patient Active Problem List   Diagnosis Date Noted  . Acute lower UTI   . AKI (acute kidney injury) (HCC)   . Hypokalemia   . Dehydration 07/19/2017  . Hypernatremia 07/19/2017  . UTI (urinary tract infection) 07/19/2017  . Schizophrenia (HCC)  07/18/2017  . High risk medication use 07/25/2016  . Involuntary commitment   . Paranoid schizophrenia (HCC) 06/16/2015  . Noncompliance 06/16/2015  . Altered awareness, transient   . Altered mental status 02/19/2014  . Essential hypertension, benign 02/10/2012  . Tooth decay 02/10/2012  . Obesity 02/10/2012  . Impaired fasting blood sugar 02/10/2012    Past Surgical History:  Procedure Laterality Date  . WISDOM TOOTH EXTRACTION       OB History   None      Home Medications    Prior to Admission medications   Medication Sig Start Date End Date Taking? Authorizing Provider  amLODipine (NORVASC) 5 MG tablet Take 1 tablet (5 mg total) by mouth daily. 07/25/17 08/24/17  Arrien, York Ram, MD  benztropine (COGENTIN) 1 MG tablet Take 1 tablet (1 mg total) by mouth daily. Patient not taking: Reported on 07/11/2017 07/25/16   Tysinger, Kermit Balo, PA-C  FLUoxetine (PROZAC) 40 MG capsule Take 1 capsule (40 mg total) by mouth daily. 07/25/16   Tysinger, Kermit Balo, PA-C  LORazepam (ATIVAN) 0.5 MG tablet Take 0.5 mg by mouth daily as needed. 11/23/16   [provider]  methocarbamol (ROBAXIN) 500 MG tablet Take 1 tablet (500 mg total) by mouth 4 (four) times daily. 12/31/16   Fisher, Roselyn Bering, PA-C  OLANZapine (ZYPREXA) 15 MG tablet Take 15 mg by mouth at bedtime. 11/23/16   [provider]  potassium chloride 20 MEQ TBCR Take  20 mEq by mouth daily for 7 days. 01/20/18 01/27/18  Ahri Olson, PA-C    Family History Family History  Problem Relation Age of Onset  . Diabetes Mother   . Kidney disease Mother        dialysis  . Heart disease Mother   . Cancer Maternal Uncle        lung  . Stroke Neg Hx   . Hypertension Neg Hx   . Hyperlipidemia Neg Hx     Social History Social History   Tobacco Use  . Smoking status: Never Smoker  . Smokeless tobacco: Never Used  Substance Use Topics  . Alcohol use: No  . Drug use: No     Allergies   Paliperidone; Sulfa  antibiotics; and Lisinopril   Review of Systems Review of Systems  Genitourinary:       Sometimes decreased urine, other times urinary frequency  All other systems reviewed and are negative.    Physical Exam Updated Vital Signs BP (!) 149/76   Pulse 81   Temp 97.9 F (36.6 C) (Oral)   Resp 19   SpO2 99%   Physical Exam  Constitutional: She is oriented to person, place, and time. She appears well-developed and well-nourished. No distress.  Sitting comfortably in the bed in no acute distress  HENT:  Head: Normocephalic and atraumatic.  MM dry  Eyes: Pupils are equal, round, and reactive to light. Conjunctivae and EOM are normal.  Neck: Normal range of motion. Neck supple.  Cardiovascular: Normal rate, regular rhythm and intact distal pulses.  Pulmonary/Chest: Effort normal and breath sounds normal. No respiratory distress. She has no wheezes.  Abdominal: Soft. She exhibits no distension and no mass. There is no tenderness. There is no rebound and no guarding.  No tenderness palpation of the abdomen.  Soft without rigidity, guarding, distention.  Negative rebound.  No tenderness palpation of the suprapubic abdomen.  No cva tenderness  Musculoskeletal: Normal range of motion.  Neurological: She is alert and oriented to person, place, and time.  Skin: Skin is warm and dry. Capillary refill takes less than 2 seconds.  Psychiatric: Her affect is blunt. She is withdrawn. Thought content is not delusional. She expresses no homicidal and no suicidal ideation. She expresses no suicidal plans and no homicidal plans.  Affect and behavior blunted and withdrawn.  No obvious response to internal stimuli.   Nursing note and vitals reviewed.    ED Treatments / Results  Labs (all labs ordered are listed, but only abnormal results are displayed) Labs Reviewed  COMPREHENSIVE METABOLIC PANEL - Abnormal; Notable for the following components:      Result Value   Sodium 147 (*)    Potassium  2.6 (*)    Glucose, Bld 107 (*)    BUN 39 (*)    Creatinine, Ser 1.19 (*)    GFR calc non Af Amer 49 (*)    GFR calc Af Amer 57 (*)    All other components within normal limits  URINALYSIS, ROUTINE W REFLEX MICROSCOPIC - Abnormal; Notable for the following components:   Ketones, ur 5 (*)    All other components within normal limits  CBC  I-STAT CG4 LACTIC ACID, ED    EKG None  Radiology No results found.  Procedures Procedures (including critical care time)  Medications Ordered in ED Medications  potassium chloride 10 mEq in 100 mL IVPB (has no administration in time range)  sodium chloride 0.9 % bolus 1,000 mL (has no  administration in time range)  sodium chloride 0.9 % bolus 1,000 mL (1,000 mLs Intravenous New Bag/Given 01/20/18 1803)  potassium chloride 10 mEq in 100 mL IVPB (10 mEq Intravenous New Bag/Given 01/20/18 1815)  potassium chloride SA (K-DUR,KLOR-CON) CR tablet 40 mEq (40 mEq Oral Given 01/20/18 1729)     Initial Impression / Assessment and Plan / ED Course  I have reviewed the triage vital signs and the nursing notes.  Pertinent labs & imaging results that were available during my care of the patient were reviewed by me and considered in my medical decision making (see chart for details).     Pt presented for evaluation of abnormal urination and behavior changes.  Physical exam shows patient who is clinically dehydrated.  Mucous membrane's are dry.  Per chart review, patient with a history of dehydration due to poor oral intake.  Will obtain labs, urine, and reassess.  She is without infectious symptoms including fever, chills, cough, vomiting, or diarrhea.  Low suspicion for infection causing altered mental status, is likely related to patient's psychiatric history. Family agrees.   Labs show critically low potassium at 2.6.  Creatinine widely elevated at 1.19.  Overall, this is a dehydration picture.  Will give fluid boluses and 3 months potassium along with  oral potassium.  Discussed at length with patient and family importance of potassium intake.  We will plan to discharge on potassium pills.  Otherwise, labs are reassuring.  No leukocytosis.  Lactic negative.  Patient remains afebrile, not tachycardic, not hypotensive.  Urine pending.  UA negative for infection. urinary sxs likely related to dehydration. Discussed with pt family that patient should follow-up with her psychiatrist regarding her behavior changes.  Follow-up with her PCP in 1 to 2 weeks for recheck of her labs. At this time, pt appear safe for d/c. Return precautions given. Pt and family state they understand and agree to plan.   Final Clinical Impressions(s) / ED Diagnoses   Final diagnoses:  Hypokalemia  Dehydration    ED Discharge Orders         Ordered    potassium chloride 20 MEQ TBCR  Daily     01/20/18 2203           Alveria Apley, PA-C 01/20/18 2210    Linwood Dibbles, MD 01/23/18 [phone]

## 2018-01-20 NOTE — Discharge Instructions (Signed)
It is very important that you are staying well hydrated. Your urine should be clear to pale yellow.  Take potassium as prescribed.  Follow up with psychiatry for further management of your psychiatric medications.  Follow up with your primary care for recheck of your labs.  Return to the ER with any new, worsening, or concerning symptoms.

## 2018-01-20 NOTE — ED Triage Notes (Signed)
Pt presents for evaluation of urinary symptoms x 5 days. States she has some alternating frequency and retention. Pt is confused, family states this is not her normal.

## 2018-01-26 ENCOUNTER — Ambulatory Visit (HOSPITAL_COMMUNITY)
Admission: RE | Admit: 2018-01-26 | Discharge: 2018-01-26 | Disposition: A | Discharge status: Home / Self Care | Payer: BLUE CROSS/BLUE SHIELD | Source: Home / Self Care | Attending: Psychiatry | Admitting: Psychiatry

## 2018-01-26 ENCOUNTER — Emergency Department (HOSPITAL_COMMUNITY)
Admission: EM | Admit: 2018-01-26 | Discharge: 2018-01-27 | Disposition: A | Discharge status: Home / Self Care | Payer: BLUE CROSS/BLUE SHIELD | Attending: Emergency Medicine | Admitting: Emergency Medicine

## 2018-01-26 ENCOUNTER — Emergency Department (HOSPITAL_COMMUNITY)
Admission: EM | Admit: 2018-01-26 | Discharge: 2018-01-26 | Disposition: A | Discharge status: Home / Self Care | Payer: Self-pay | Attending: Emergency Medicine | Admitting: Emergency Medicine

## 2018-01-26 DIAGNOSIS — I10 Essential (primary) hypertension: Secondary | ICD-10-CM | POA: Insufficient documentation

## 2018-01-26 DIAGNOSIS — M25512 Pain in left shoulder: Secondary | ICD-10-CM

## 2018-01-26 DIAGNOSIS — Z79899 Other long term (current) drug therapy: Secondary | ICD-10-CM | POA: Insufficient documentation

## 2018-01-26 DIAGNOSIS — M7918 Myalgia, other site: Secondary | ICD-10-CM

## 2018-01-26 DIAGNOSIS — M542 Cervicalgia: Secondary | ICD-10-CM | POA: Diagnosis not present

## 2018-01-26 DIAGNOSIS — Z5321 Procedure and treatment not carried out due to patient leaving prior to being seen by health care provider: Secondary | ICD-10-CM | POA: Insufficient documentation

## 2018-01-26 LAB — I-STAT CHEM 8, ED
BUN: 39 mg/dL — ABNORMAL HIGH (ref 6–20)
Calcium, Ion: 1.2 mmol/L (ref 1.15–1.40)
Chloride: 110 mmol/L (ref 98–111)
Creatinine, Ser: 1 mg/dL (ref 0.44–1.00)
Glucose, Bld: 99 mg/dL (ref 70–99)
HCT: 37 % (ref 36.0–46.0)
Hemoglobin: 12.6 g/dL (ref 12.0–15.0)
Potassium: 3.7 mmol/L (ref 3.5–5.1)
Sodium: 148 mmol/L — ABNORMAL HIGH (ref 135–145)
TCO2: 32 mmol/L (ref 22–32)

## 2018-01-26 MED ORDER — IBUPROFEN 600 MG PO TABS: 600.0000 mg | ORAL_TABLET | Freq: Four times a day (QID) | ORAL | 0 refills | Status: DC | PRN
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
## 2018-01-26 NOTE — ED Triage Notes (Signed)
Pt to ER with vague complaint of requesting her labs be drawn to check potassium. Reports left shoulder pain that is worsened by movement. Pt in NAD. Reports was at [facility] until earlier today and wanted to come here for potassium check.

## 2018-01-26 NOTE — H&P (Signed)
Behavioral Health Medical Screening Exam  Amy Casey is an 57 y.o. female.  Patient presented over to the behavioral health Hospital as a walk-in with complaints of feeling sad, depressed, loss of energy, fatigue.  She reports that she was at the hospital a couple of days ago she was told her potassium was low and she was given some potassium pills to take.  Patient reports that she has not been taking them as prescribed and she has forgotten a couple of times.  Patient then complains of having left shoulder pain, but denies any chest pain or shortness of breath.  Patient states she does feel something is not right and she thought that since she was cleared at the hospital on 01/20/2018 that she should come here to see if it is her psychiatric medications.  Patient presented well and spoke with logical conversation and understanding.  After reviewing patient's labs patient's potassium level on 01/20/2018 was 2.6 and there was not a redraw before the patient left the ED.  There was no EKG done either.  Feel the patient needs to be sent back to the emergency department to have a further workup to ensure that there is no other issues with patient's heart or lab values.  Total Time spent with patient: 20 minutes  Psychiatric Specialty Exam: Physical Exam  Nursing note and vitals reviewed. Constitutional: She is oriented to person, place, and time. She appears well-developed and well-nourished.  Cardiovascular: Normal rate.  Respiratory: Effort normal.  Musculoskeletal: Normal range of motion.  Neurological: She is alert and oriented to person, place, and time.  Skin: Skin is warm.    Review of Systems  Constitutional: Negative.   HENT: Negative.   Eyes: Negative.   Respiratory: Negative.   Cardiovascular: Negative.   Gastrointestinal: Negative.   Genitourinary: Negative.   Musculoskeletal:       Left shoulder pain  Skin: Negative.   Neurological: Negative.   Endo/Heme/Allergies:  Negative.   Psychiatric/Behavioral: Positive for depression. Negative for hallucinations, substance abuse and suicidal ideas. The patient is not nervous/anxious.     Blood pressure 122/76, pulse 78, temperature 97.7 F (36.5 C), resp. rate 16, SpO2 100 %.There is no height or weight on file to calculate BMI.  General Appearance: Casual  Eye Contact:  Good  Speech:  Clear and Coherent  Volume:  Decreased  Mood:  Depressed  Affect:  Flat  Thought Process:  Linear and Descriptions of Associations: Intact  Orientation:  Full (Time, Place, and Person)  Thought Content:  WDL  Suicidal Thoughts:  No  Homicidal Thoughts:  No  Memory:  Immediate;   Good Recent;   Good Remote;   Good  Judgement:  Fair  Insight:  Fair  Psychomotor Activity:  Normal  Concentration: Concentration: Good  Recall:  Good  Fund of Knowledge:Good  Language: Good  Akathisia:  No  Handed:  Right  AIMS (if indicated):     Assets:  Communication Skills Desire for Improvement Financial Resources/Insurance Housing Physical Health Social Support Transportation  Sleep:       Musculoskeletal: Strength & Muscle Tone: within normal limits Gait & Station: normal Patient leans: N/A  Blood pressure 122/76, pulse 78, temperature 97.7 F (36.5 C), resp. rate 16, SpO2 100 %.  Recommendations:  Based on my evaluation patient needs to return to the emergency department for evaluation.  Patient refused to be transported by hospital transportation such as EMS or [facility].  Her husband is with her and stated that he would  take her to the emergency room again.  The transportation with him and not emergent at this time.  Gerlene Burdock Terrick Allred, FNP 01/26/2018, 6:25 PM

## 2018-01-26 NOTE — ED Provider Notes (Signed)
[facility] EMERGENCY DEPARTMENT Provider Note   CSN: [id number] Arrival date & time: 01/26/18  1851     History   Chief Complaint No chief complaint on file.   HPI Amy Casey is a 57 y.o. female.  Patient presents with complaint of left shoulder discomfort that started "a while back". No previous or recent injury that she is aware of. It is worse with movement and better with rest. No neck, chest pain. No pain that radiates into the arm. No UE weakness or numbness. She is also concerned bout her potassium because the last time she was here it was low. She has been taking the potassium supplements as prescribed.   The history is provided by the patient and the spouse. No language interpreter was used.    Past Medical History:  Diagnosis Date  . Depression    Dr. Mila Homer, [facility]; hospitalization 07/2010  . History of echocardiogram 2011   [facility]  . History of renal stone   . Hypertension   . Hypokalemia   . Obesity   . Schizophrenia (HCC)   . Wears glasses     Patient Active Problem List   Diagnosis Date Noted  . Acute lower UTI   . AKI (acute kidney injury) (HCC)   . Hypokalemia   . Dehydration 07/19/2017  . Hypernatremia 07/19/2017  . UTI (urinary tract infection) 07/19/2017  . Schizophrenia (HCC) 07/18/2017  . High risk medication use 07/25/2016  . Involuntary commitment   . Paranoid schizophrenia (HCC) 06/16/2015  . Noncompliance 06/16/2015  . Altered awareness, transient   . Altered mental status 02/19/2014  . Essential hypertension, benign 02/10/2012  . Tooth decay 02/10/2012  . Obesity 02/10/2012  . Impaired fasting blood sugar 02/10/2012    Past Surgical History:  Procedure Laterality Date  . WISDOM TOOTH EXTRACTION       OB History   None      Home Medications    Prior to Admission medications   Medication Sig Start Date End Date Taking? Authorizing Provider  amLODipine (NORVASC) 5 MG tablet Take 1 tablet (5 mg  total) by mouth daily. 07/25/17 08/24/17  Arrien, York Ram, MD  benztropine (COGENTIN) 1 MG tablet Take 1 tablet (1 mg total) by mouth daily. Patient not taking: Reported on 07/11/2017 07/25/16   Tysinger, Kermit Balo, PA-C  FLUoxetine (PROZAC) 40 MG capsule Take 1 capsule (40 mg total) by mouth daily. 07/25/16   Tysinger, Kermit Balo, PA-C  LORazepam (ATIVAN) 0.5 MG tablet Take 0.5 mg by mouth daily as needed. 11/23/16   [provider]  methocarbamol (ROBAXIN) 500 MG tablet Take 1 tablet (500 mg total) by mouth 4 (four) times daily. 12/31/16   Fisher, Roselyn Bering, PA-C  OLANZapine (ZYPREXA) 15 MG tablet Take 15 mg by mouth at bedtime. 11/23/16   [provider]  potassium chloride 20 MEQ TBCR Take 20 mEq by mouth daily for 7 days. 01/20/18 01/27/18  Caccavale, Sophia, PA-C    Family History Family History  Problem Relation Age of Onset  . Diabetes Mother   . Kidney disease Mother        dialysis  . Heart disease Mother   . Cancer Maternal Uncle        lung  . Stroke Neg Hx   . Hypertension Neg Hx   . Hyperlipidemia Neg Hx     Social History Social History   Tobacco Use  . Smoking status: Never Smoker  . Smokeless tobacco: Never Used  Substance Use Topics  . Alcohol use: No  . Drug use: No     Allergies   Paliperidone; Sulfa antibiotics; and Lisinopril   Review of Systems Review of Systems  Constitutional: Negative for chills and fever.  Cardiovascular: Negative.  Negative for chest pain.  Musculoskeletal: Negative for neck pain.       See HPI.  Skin: Negative.   Neurological: Negative.  Negative for weakness and numbness.     Physical Exam Updated Vital Signs BP 102/66   Pulse 75   Temp 98 F (36.7 C) (Oral)   Resp 14   SpO2 96%   Physical Exam  Constitutional: She is oriented to person, place, and time. She appears well-developed and well-nourished.  HENT:  Head: Normocephalic.  Neck: Normal range of motion. Neck supple. Carotid bruit is not  present.  Cardiovascular: Normal rate, regular rhythm and intact distal pulses.  Pulmonary/Chest: Effort normal and breath sounds normal.  Abdominal: Soft. Bowel sounds are normal. There is no tenderness. There is no rebound and no guarding.  Musculoskeletal: Normal range of motion.  Left shoulder has no swelling, deformity or discoloration. No midline or paracervical tenderness. There is minimal tenderness to left lateral neck into superior shoulder. No induration. FROM of neck and bilateral UE's. Symmetric strength without deficits bilateral UE's.   Neurological: She is alert and oriented to person, place, and time.  Skin: Skin is warm and dry. No rash noted.  Psychiatric: She has a normal mood and affect.     ED Treatments / Results  Labs (all labs ordered are listed, but only abnormal results are displayed) Labs Reviewed  I-STAT CHEM 8, ED - Abnormal; Notable for the following components:      Result Value   Sodium 148 (*)    BUN 39 (*)    All other components within normal limits   Results for orders placed or performed during the hospital encounter of 01/26/18  I-Stat Chem 8, ED  Result Value Ref Range   Sodium 148 (H) 135 - 145 mmol/L   Potassium 3.7 3.5 - 5.1 mmol/L   Chloride 110 98 - 111 mmol/L   BUN 39 (H) 6 - 20 mg/dL   Creatinine, Ser 1.61 0.44 - 1.00 mg/dL   Glucose, Bld 99 70 - 99 mg/dL   Calcium, Ion 0.96 1.15 - 1.40 mmol/L   TCO2 32 22 - 32 mmol/L   Hemoglobin 12.6 12.0 - 15.0 g/dL   HCT 04.5 40.9 - 81.1 %    EKG None  Radiology No results found.  Procedures Procedures (including critical care time)  Medications Ordered in ED Medications - No data to display   Initial Impression / Assessment and Plan / ED Course  I have reviewed the triage vital signs and the nursing notes.  Pertinent labs & imaging results that were available during my care of the patient were reviewed by me and considered in my medical decision making (see chart for  details).     Patient here with concern for potassium that was low recently. Potassium is normal tonight.   Also concerned for left shoulder pain without injury. No bony tenderness, weakness, swelling. No sign of infection. Suspect mild muscular pain.   Final Clinical Impressions(s) / ED Diagnoses   Final diagnoses:  None   1. Left shoulder pain  ED Discharge Orders    None       Elpidio Anis, PA-C 01/26/18 [id number]    Gilda Crease, MD 01/27/18 0004

## 2018-01-26 NOTE — Discharge Instructions (Addendum)
Follow up with your doctor if shoulder pain continues.

## 2018-03-03 ENCOUNTER — Ambulatory Visit (HOSPITAL_COMMUNITY)
Admission: EM | Admit: 2018-03-03 | Discharge: 2018-03-03 | Disposition: A | Discharge status: Home / Self Care | Payer: BLUE CROSS/BLUE SHIELD | Attending: Internal Medicine | Admitting: Internal Medicine

## 2018-03-03 ENCOUNTER — Encounter (HOSPITAL_COMMUNITY): Payer: Self-pay

## 2018-03-03 ENCOUNTER — Other Ambulatory Visit: Payer: Self-pay

## 2018-03-03 DIAGNOSIS — L309 Dermatitis, unspecified: Secondary | ICD-10-CM | POA: Diagnosis not present

## 2018-03-03 MED ORDER — HYDROCORTISONE 2.5 % EX LOTN: TOPICAL_LOTION | Freq: Two times a day (BID) | CUTANEOUS | 0 refills | Status: AC

## 2018-03-03 NOTE — Discharge Instructions (Addendum)
Get a  cream in a jar, like Nivea or Cytaphil and apply on your skin after showering and leave some water beads on your skin to help absorb the moisture.   If you get worse, like in your wrists, between your fingers, waists line, braw area then could be you are getting scabies.

## 2018-03-03 NOTE — ED Triage Notes (Signed)
Pt cc right arm rash x 4 days.

## 2018-03-03 NOTE — ED Provider Notes (Signed)
[facility]    CSN: [id number] Arrival date & time: 03/03/18  1348     History   Chief Complaint Chief Complaint  Patient presents with  . Rash    HPI Amy Casey is a 57 y.o. female.   Who developed  Rash on R forearm x 4 days. Has been using a new lotion. States has hx of dry skin. Has had mild itching on R thorax area, and this am she felt another spot area above buttocks crack. Her husband does not have a rash.      Past Medical History:  Diagnosis Date  . Depression    Dr. Mila HomerSena, [facility]; hospitalization 07/2010  . History of echocardiogram 2011   [facility]  . History of renal stone   . Hypertension   . Hypokalemia   . Obesity   . Schizophrenia (HCC)   . Wears glasses     Patient Active Problem List   Diagnosis Date Noted  . Acute lower UTI   . AKI (acute kidney injury) (HCC)   . Hypokalemia   . Dehydration 07/19/2017  . Hypernatremia 07/19/2017  . UTI (urinary tract infection) 07/19/2017  . Schizophrenia (HCC) 07/18/2017  . High risk medication use 07/25/2016  . Involuntary commitment   . Paranoid schizophrenia (HCC) 06/16/2015  . Noncompliance 06/16/2015  . Altered awareness, transient   . Altered mental status 02/19/2014  . Essential hypertension, benign 02/10/2012  . Tooth decay 02/10/2012  . Obesity 02/10/2012  . Impaired fasting blood sugar 02/10/2012    Past Surgical History:  Procedure Laterality Date  . WISDOM TOOTH EXTRACTION      OB History   None      Home Medications    Prior to Admission medications   Medication Sig Start Date End Date Taking? Authorizing Provider  amLODipine (NORVASC) 5 MG tablet Take 1 tablet (5 mg total) by mouth daily. 07/25/17 08/24/17  Arrien, York RamMauricio Daniel, MD  FLUoxetine (PROZAC) 40 MG capsule Take 1 capsule (40 mg total) by mouth daily. 07/25/16   Tysinger, Kermit Baloavid S, PA-C  hydrocortisone 2.5 % lotion Apply topically 2 (two) times daily for 7 days. 03/03/18 03/10/18   Rodriguez-Southworth, Nettie ElmSylvia, PA-C  ibuprofen (ADVIL,MOTRIN) 600 MG tablet Take 1 tablet (600 mg total) by mouth every 6 (six) hours as needed. 01/26/18   Elpidio AnisUpstill, Shari, PA-C  LORazepam (ATIVAN) 0.5 MG tablet Take 0.5 mg by mouth daily as needed. 11/23/16   [provider]  methocarbamol (ROBAXIN) 500 MG tablet Take 1 tablet (500 mg total) by mouth 4 (four) times daily. 12/31/16   Fisher, Roselyn BeringSusan W, PA-C  OLANZapine (ZYPREXA) 15 MG tablet Take 15 mg by mouth at bedtime. 11/23/16   [provider]  potassium chloride 20 MEQ TBCR Take 20 mEq by mouth daily for 7 days. 01/20/18 01/27/18  Caccavale, Sophia, PA-C    Family History Family History  Problem Relation Age of Onset  . Diabetes Mother   . Kidney disease Mother        dialysis  . Heart disease Mother   . Cancer Maternal Uncle        lung  . Stroke Neg Hx   . Hypertension Neg Hx   . Hyperlipidemia Neg Hx     Social History Social History   Tobacco Use  . Smoking status: Never Smoker  . Smokeless tobacco: Never Used  Substance Use Topics  . Alcohol use: No  . Drug use: No     Allergies   Paliperidone;  Sulfa antibiotics; and Lisinopril   Review of Systems Review of Systems  Constitutional: Negative for appetite change, chills, diaphoresis and fever.  HENT: Negative.   Eyes: Negative for discharge.  Respiratory: Negative for cough.   Musculoskeletal: Negative for myalgias.  Skin: Positive for rash.  Hematological: Negative for adenopathy.   Physical Exam Triage Vital Signs ED Triage Vitals  Enc Vitals Group     BP 03/03/18 1532 (!) 123/97     Pulse Rate 03/03/18 1532 97     Resp 03/03/18 1532 18     Temp 03/03/18 1532 98.3 F (36.8 C)     Temp src --      SpO2 03/03/18 1532 100 %     Weight 03/03/18 1534 170 lb (77.1 kg)     Height --      Head Circumference --      Peak Flow --      Pain Score 03/03/18 1533 1     Pain Loc --      Pain Edu? --      Excl. in GC? --    No data  found.  Updated Vital Signs BP (!) 123/97 (BP Location: Right Arm)   Pulse 97   Temp 98.3 F (36.8 C)   Resp 18   Wt 170 lb (77.1 kg)   SpO2 100%   BMI 24.05 kg/m   Visual Acuity Right Eye Distance:   Left Eye Distance:   Bilateral Distance:    Right Eye Near:   Left Eye Near:    Bilateral Near:     Physical Exam  Constitutional: She is oriented to person, place, and time. She appears well-developed and well-nourished. No distress.  HENT:  Head: Normocephalic.  Right Ear: External ear normal.  Left Ear: External ear normal.  Nose: Nose normal.  Eyes: Conjunctivae are normal. Right eye exhibits no discharge. Left eye exhibits no discharge. No scleral icterus.  Neck: Neck supple.  Cardiovascular: Normal rate and regular rhythm.  No murmur heard. Pulmonary/Chest: Effort normal and breath sounds normal. No respiratory distress.  Neurological: She is alert and oriented to person, place, and time.  Skin: Skin is warm. She is not diaphoretic.  Dry skin all over, but R forearm with skin color papules and few scabs in linear fashion from scratching. Also  has circular one on the center of gluteal fold of a bout quarter since with few scabs. Her skin is dry all over. No rash present of wrist, between fingers waist area or under braw region   Psychiatric: She has a normal mood and affect. Her behavior is normal. Judgment and thought content normal.  Nursing note and vitals reviewed.  UC Treatments / Results  Labs (all labs ordered are listed, but only abnormal results are displayed) Labs Reviewed - No data to display  EKG None  Radiology No results found.  Medications Ordered in UC Medications - No data to display  Initial Impression / Assessment and Plan / UC Course  I have reviewed the triage vital signs and the nursing notes. I educated her about scabies and what to watch out for. I placed her on Hydrocortisone cream 2.5 % bid x 7 days.  Advised to apply Nivea or  Cytaphil cream after showering, see instructions.      Final Clinical Impressions(s) / UC Diagnoses   Final diagnoses:  Eczema, unspecified type     Discharge Instructions     Get a  cream in a jar, like Nivea or  Cytaphil and apply on your skin after showering and leave some water beads on your skin to help absorb the moisture.   If you get worse, like in your wrists, between your fingers, waists line, braw area then could be you are getting scabies.     ED Prescriptions    Medication Sig Dispense Auth. Provider   hydrocortisone 2.5 % lotion Apply topically 2 (two) times daily for 7 days. 59 mL Rodriguez-Southworth, Nettie Elm, PA-C     Controlled Substance Prescriptions [place] Controlled Substance Registry consulted?    Garey Ham, PA-C 03/03/18 1711

## 2018-04-09 ENCOUNTER — Telehealth: Payer: Self-pay | Admitting: Medical

## 2018-04-09 NOTE — Telephone Encounter (Signed)
Left message that pt needs appt per Vincenza Hews, form from [facility] in folder

## 2018-04-13 ENCOUNTER — Ambulatory Visit: Payer: Self-pay | Admitting: Medical

## 2018-04-13 DIAGNOSIS — F209 Schizophrenia, unspecified: Secondary | ICD-10-CM | POA: Diagnosis not present

## 2018-05-23 DIAGNOSIS — F4310 Post-traumatic stress disorder, unspecified: Secondary | ICD-10-CM | POA: Diagnosis not present

## 2018-05-23 DIAGNOSIS — F250 Schizoaffective disorder, bipolar type: Secondary | ICD-10-CM | POA: Diagnosis not present

## 2018-07-03 ENCOUNTER — Telehealth: Payer: Self-pay | Admitting: Medical

## 2018-07-03 NOTE — Telephone Encounter (Signed)
Needs virtual med check, may need to call husband given her mental health issues

## 2018-07-04 NOTE — Telephone Encounter (Signed)
Left message to call back to schedule virtual med check

## 2018-07-19 ENCOUNTER — Telehealth: Payer: Self-pay | Admitting: Medical

## 2018-07-19 NOTE — Telephone Encounter (Signed)
Needs virtual med check 

## 2018-07-20 NOTE — Telephone Encounter (Signed)
Spoke to pt's husband and he will set up a visit for her when he knows her schedule

## 2018-07-25 DIAGNOSIS — F250 Schizoaffective disorder, bipolar type: Secondary | ICD-10-CM | POA: Diagnosis not present

## 2018-07-25 DIAGNOSIS — F4310 Post-traumatic stress disorder, unspecified: Secondary | ICD-10-CM | POA: Diagnosis not present

## 2018-07-30 ENCOUNTER — Encounter (HOSPITAL_COMMUNITY): Payer: Self-pay | Admitting: Emergency Medicine

## 2018-07-30 ENCOUNTER — Other Ambulatory Visit: Payer: Self-pay

## 2018-07-30 ENCOUNTER — Inpatient Hospital Stay (HOSPITAL_COMMUNITY)
Admission: EM | Admit: 2018-07-30 | Discharge: 2018-08-09 | DRG: 885 | Disposition: A | Discharge status: Home / Self Care | Payer: BLUE CROSS/BLUE SHIELD | Attending: Internal Medicine | Admitting: Internal Medicine

## 2018-07-30 ENCOUNTER — Emergency Department (HOSPITAL_COMMUNITY): Payer: BLUE CROSS/BLUE SHIELD

## 2018-07-30 DIAGNOSIS — Z1159 Encounter for screening for other viral diseases: Secondary | ICD-10-CM

## 2018-07-30 DIAGNOSIS — E873 Alkalosis: Secondary | ICD-10-CM | POA: Diagnosis not present

## 2018-07-30 DIAGNOSIS — E876 Hypokalemia: Secondary | ICD-10-CM | POA: Diagnosis not present

## 2018-07-30 DIAGNOSIS — N390 Urinary tract infection, site not specified: Secondary | ICD-10-CM | POA: Diagnosis present

## 2018-07-30 DIAGNOSIS — N179 Acute kidney failure, unspecified: Secondary | ICD-10-CM | POA: Diagnosis not present

## 2018-07-30 DIAGNOSIS — R634 Abnormal weight loss: Secondary | ICD-10-CM | POA: Diagnosis not present

## 2018-07-30 DIAGNOSIS — E860 Dehydration: Secondary | ICD-10-CM | POA: Diagnosis present

## 2018-07-30 DIAGNOSIS — Z8249 Family history of ischemic heart disease and other diseases of the circulatory system: Secondary | ICD-10-CM

## 2018-07-30 DIAGNOSIS — G9389 Other specified disorders of brain: Secondary | ICD-10-CM | POA: Diagnosis not present

## 2018-07-30 DIAGNOSIS — I10 Essential (primary) hypertension: Secondary | ICD-10-CM | POA: Diagnosis present

## 2018-07-30 DIAGNOSIS — R3915 Urgency of urination: Secondary | ICD-10-CM | POA: Diagnosis not present

## 2018-07-30 DIAGNOSIS — F200 Paranoid schizophrenia: Secondary | ICD-10-CM | POA: Diagnosis not present

## 2018-07-30 DIAGNOSIS — Z882 Allergy status to sulfonamides status: Secondary | ICD-10-CM

## 2018-07-30 DIAGNOSIS — G9340 Encephalopathy, unspecified: Secondary | ICD-10-CM | POA: Diagnosis not present

## 2018-07-30 DIAGNOSIS — Z888 Allergy status to other drugs, medicaments and biological substances status: Secondary | ICD-10-CM

## 2018-07-30 DIAGNOSIS — Z9119 Patient's noncompliance with other medical treatment and regimen: Secondary | ICD-10-CM | POA: Diagnosis not present

## 2018-07-30 DIAGNOSIS — Z9114 Patient's other noncompliance with medication regimen: Secondary | ICD-10-CM | POA: Diagnosis not present

## 2018-07-30 DIAGNOSIS — Z87440 Personal history of urinary (tract) infections: Secondary | ICD-10-CM

## 2018-07-30 DIAGNOSIS — E875 Hyperkalemia: Secondary | ICD-10-CM | POA: Diagnosis not present

## 2018-07-30 DIAGNOSIS — F333 Major depressive disorder, recurrent, severe with psychotic symptoms: Secondary | ICD-10-CM | POA: Diagnosis not present

## 2018-07-30 DIAGNOSIS — R4182 Altered mental status, unspecified: Secondary | ICD-10-CM

## 2018-07-30 DIAGNOSIS — Z91199 Patient's noncompliance with other medical treatment and regimen due to unspecified reason: Secondary | ICD-10-CM

## 2018-07-30 DIAGNOSIS — F202 Catatonic schizophrenia: Secondary | ICD-10-CM | POA: Diagnosis not present

## 2018-07-30 DIAGNOSIS — Z79899 Other long term (current) drug therapy: Secondary | ICD-10-CM | POA: Diagnosis not present

## 2018-07-30 DIAGNOSIS — F329 Major depressive disorder, single episode, unspecified: Secondary | ICD-10-CM | POA: Diagnosis not present

## 2018-07-30 DIAGNOSIS — F319 Bipolar disorder, unspecified: Secondary | ICD-10-CM | POA: Diagnosis present

## 2018-07-30 DIAGNOSIS — R5381 Other malaise: Secondary | ICD-10-CM | POA: Diagnosis not present

## 2018-07-30 DIAGNOSIS — G92 Toxic encephalopathy: Secondary | ICD-10-CM | POA: Diagnosis not present

## 2018-07-30 DIAGNOSIS — F22 Delusional disorders: Secondary | ICD-10-CM | POA: Diagnosis not present

## 2018-07-30 DIAGNOSIS — Z87442 Personal history of urinary calculi: Secondary | ICD-10-CM | POA: Diagnosis not present

## 2018-07-30 DIAGNOSIS — F4310 Post-traumatic stress disorder, unspecified: Secondary | ICD-10-CM | POA: Diagnosis not present

## 2018-07-30 DIAGNOSIS — R110 Nausea: Secondary | ICD-10-CM | POA: Diagnosis not present

## 2018-07-30 DIAGNOSIS — G9341 Metabolic encephalopathy: Secondary | ICD-10-CM | POA: Diagnosis present

## 2018-07-30 DIAGNOSIS — Z818 Family history of other mental and behavioral disorders: Secondary | ICD-10-CM | POA: Diagnosis not present

## 2018-07-30 DIAGNOSIS — R531 Weakness: Secondary | ICD-10-CM | POA: Diagnosis not present

## 2018-07-30 LAB — I-STAT BETA HCG BLOOD, ED (MC, WL, AP ONLY): I-stat hCG, quantitative: <5 m[IU]/mL (ref <5)

## 2018-07-30 LAB — RAPID URINE DRUG SCREEN, HOSP PERFORMED
Amphetamines: NOT DETECTED
Barbiturates: NOT DETECTED
Benzodiazepines: NOT DETECTED
Cocaine: NOT DETECTED
Opiates: NOT DETECTED
Tetrahydrocannabinol: NOT DETECTED

## 2018-07-30 LAB — CBC
HCT: 45.3 % (ref 36.0–46.0)
Hemoglobin: 14.3 g/dL (ref 12.0–15.0)
MCH: 28.7 pg (ref 26.0–34.0)
MCHC: 31.6 g/dL (ref 30.0–36.0)
MCV: 90.8 fL (ref 80.0–100.0)
Platelets: 408 10*3/uL — ABNORMAL HIGH (ref 150–400)
RBC: 4.99 MIL/uL (ref 3.87–5.11)
RDW: 12.7 % (ref 11.5–15.5)
WBC: 8 10*3/uL (ref 4.0–10.5)
nRBC: 0 % (ref 0.0–0.2)

## 2018-07-30 LAB — URINALYSIS, ROUTINE W REFLEX MICROSCOPIC
Bilirubin Urine: NEGATIVE
Glucose, UA: NEGATIVE mg/dL
Hgb urine dipstick: NEGATIVE
Ketones, ur: 5 mg/dL — AB
Nitrite: NEGATIVE
Protein, ur: 30 mg/dL — AB
Specific Gravity, Urine: 1.019 (ref 1.005–1.030)
pH: 5 (ref 5.0–8.0)

## 2018-07-30 LAB — COMPREHENSIVE METABOLIC PANEL
ALT: 29 U/L (ref 0–44)
AST: 31 U/L (ref 15–41)
Albumin: 4.4 g/dL (ref 3.5–5.0)
Alkaline Phosphatase: 69 U/L (ref 38–126)
Anion gap: 14 (ref 5–15)
BUN: 44 mg/dL — ABNORMAL HIGH (ref 6–20)
CO2: 32 mmol/L (ref 22–32)
Calcium: 9.6 mg/dL (ref 8.9–10.3)
Chloride: 99 mmol/L (ref 98–111)
Creatinine, Ser: 1.58 mg/dL — ABNORMAL HIGH (ref 0.44–1.00)
GFR calc Af Amer: 41 mL/min — ABNORMAL LOW (ref >60)
GFR calc non Af Amer: 35 mL/min — ABNORMAL LOW (ref >60)
Glucose, Bld: 110 mg/dL — ABNORMAL HIGH (ref 70–99)
Potassium: 2.5 mmol/L — CL (ref 3.5–5.1)
Sodium: 145 mmol/L (ref 135–145)
Total Bilirubin: 1 mg/dL (ref 0.3–1.2)
Total Protein: 8 g/dL (ref 6.5–8.1)

## 2018-07-30 LAB — CBG MONITORING, ED: Glucose-Capillary: 82 mg/dL (ref 70–99)

## 2018-07-30 LAB — SALICYLATE LEVEL: Salicylate Lvl: <7 mg/dL (ref 2.8–30.0)

## 2018-07-30 LAB — AMMONIA: Ammonia: 26 umol/L (ref 9–35)

## 2018-07-30 LAB — SARS CORONAVIRUS 2 BY RT PCR (HOSPITAL ORDER, PERFORMED IN ~~LOC~~ HOSPITAL LAB): SARS Coronavirus 2: NEGATIVE

## 2018-07-30 LAB — MRSA PCR SCREENING: MRSA by PCR: NEGATIVE

## 2018-07-30 LAB — ACETAMINOPHEN LEVEL: Acetaminophen (Tylenol), Serum: <10 ug/mL — ABNORMAL LOW (ref 10–30)

## 2018-07-30 LAB — ETHANOL: Alcohol, Ethyl (B): <10 mg/dL (ref <10)

## 2018-07-30 LAB — MAGNESIUM: Magnesium: 2.3 mg/dL (ref 1.7–2.4)

## 2018-07-30 MED ORDER — ALBUTEROL SULFATE (2.5 MG/3ML) 0.083% IN NEBU: 2.5000 mg | INHALATION_SOLUTION | RESPIRATORY_TRACT | Status: DC | PRN

## 2018-07-30 MED ORDER — FLUOXETINE HCL 40 MG PO CAPS: 40.0000 mg | ORAL_CAPSULE | Freq: Every day | ORAL | Status: DC

## 2018-07-30 MED ORDER — TRAZODONE HCL 50 MG PO TABS: 50.0000 mg | ORAL_TABLET | Freq: Every evening | ORAL | Status: DC | PRN

## 2018-07-30 MED ORDER — ONDANSETRON HCL 4 MG PO TABS: 4.0000 mg | ORAL_TABLET | Freq: Four times a day (QID) | ORAL | Status: DC | PRN

## 2018-07-30 MED ORDER — AMLODIPINE BESYLATE 5 MG PO TABS
5.0000 mg | ORAL_TABLET | Freq: Every day | ORAL | Status: DC
  Administered 2018-07-30 – 2018-08-06 (×4): 5 mg via ORAL
  Filled 2018-07-30 (×12): qty 1

## 2018-07-30 MED ORDER — POLYETHYLENE GLYCOL 3350 17 G PO PACK: 17.0000 g | PACK | Freq: Every day | ORAL | Status: DC | PRN

## 2018-07-30 MED ORDER — LORAZEPAM 0.5 MG PO TABS: 0.5000 mg | ORAL_TABLET | Freq: Four times a day (QID) | ORAL | Status: DC | PRN

## 2018-07-30 MED ORDER — CEPHALEXIN 250 MG PO CAPS: 500.0000 mg | ORAL_CAPSULE | Freq: Once | ORAL | Status: DC

## 2018-07-30 MED ORDER — DEXTROSE-NACL 5-0.45 % IV SOLN
INTRAVENOUS | Status: DC
  Administered 2018-07-30 – 2018-07-31 (×4): via INTRAVENOUS

## 2018-07-30 MED ORDER — SODIUM CHLORIDE 0.9% FLUSH
3.0000 mL | Freq: Two times a day (BID) | INTRAVENOUS | Status: DC
  Administered 2018-07-31 – 2018-08-08 (×12): 3 mL via INTRAVENOUS

## 2018-07-30 MED ORDER — ONDANSETRON HCL 4 MG/2ML IJ SOLN
4.0000 mg | Freq: Four times a day (QID) | INTRAMUSCULAR | Status: DC | PRN
  Filled 2018-07-30: qty 2

## 2018-07-30 MED ORDER — SODIUM CHLORIDE 0.9% FLUSH
3.0000 mL | INTRAVENOUS | Status: DC | PRN
  Administered 2018-08-03: 3 mL via INTRAVENOUS
  Filled 2018-07-30: qty 3

## 2018-07-30 MED ORDER — ACETAMINOPHEN 650 MG RE SUPP: 650.0000 mg | Freq: Four times a day (QID) | RECTAL | Status: DC | PRN

## 2018-07-30 MED ORDER — OLANZAPINE 7.5 MG PO TABS: 15.0000 mg | ORAL_TABLET | Freq: Every day | ORAL | Status: DC

## 2018-07-30 MED ORDER — SODIUM CHLORIDE 0.9 % IV SOLN: 250.0000 mL | INTRAVENOUS | Status: DC | PRN

## 2018-07-30 MED ORDER — SODIUM CHLORIDE 0.9 % IV BOLUS
1000.0000 mL | Freq: Once | INTRAVENOUS | Status: AC
  Administered 2018-07-30: 1000 mL via INTRAVENOUS

## 2018-07-30 MED ORDER — POTASSIUM CHLORIDE 10 MEQ/100ML IV SOLN
10.0000 meq | Freq: Once | INTRAVENOUS | Status: AC
  Administered 2018-07-30: 10 meq via INTRAVENOUS
  Filled 2018-07-30: qty 100

## 2018-07-30 MED ORDER — FLUOXETINE HCL 20 MG PO CAPS
20.0000 mg | ORAL_CAPSULE | Freq: Every day | ORAL | Status: DC
  Administered 2018-08-04 – 2018-08-07 (×4): 20 mg via ORAL
  Filled 2018-07-30 (×9): qty 1

## 2018-07-30 MED ORDER — SODIUM CHLORIDE 0.9 % IV SOLN
1.0000 g | Freq: Once | INTRAVENOUS | Status: AC
  Administered 2018-07-30: 12:00:00 1 g via INTRAVENOUS
  Filled 2018-07-30: qty 10

## 2018-07-30 MED ORDER — POTASSIUM CHLORIDE 10 MEQ/100ML IV SOLN
10.0000 meq | INTRAVENOUS | Status: AC
  Administered 2018-07-30 (×5): 10 meq via INTRAVENOUS
  Filled 2018-07-30 (×5): qty 100

## 2018-07-30 MED ORDER — POTASSIUM CHLORIDE CRYS ER 20 MEQ PO TBCR: 40.0000 meq | EXTENDED_RELEASE_TABLET | Freq: Once | ORAL | Status: DC

## 2018-07-30 MED ORDER — HYDRALAZINE HCL 20 MG/ML IJ SOLN
10.0000 mg | Freq: Four times a day (QID) | INTRAMUSCULAR | Status: DC | PRN
  Administered 2018-07-30 – 2018-08-08 (×6): 10 mg via INTRAVENOUS
  Filled 2018-07-30 (×6): qty 1

## 2018-07-30 MED ORDER — HEPARIN SODIUM (PORCINE) 5000 UNIT/ML IJ SOLN
5000.0000 [IU] | Freq: Three times a day (TID) | INTRAMUSCULAR | Status: DC
  Administered 2018-07-30 – 2018-07-31 (×2): 5000 [IU] via SUBCUTANEOUS
  Filled 2018-07-30 (×2): qty 1

## 2018-07-30 MED ORDER — ACETAMINOPHEN 325 MG PO TABS
650.0000 mg | ORAL_TABLET | Freq: Four times a day (QID) | ORAL | Status: DC | PRN
  Administered 2018-08-05: 650 mg via ORAL
  Filled 2018-07-30: qty 2

## 2018-07-30 MED ORDER — SODIUM CHLORIDE 0.9 % IV SOLN
1.0000 g | INTRAVENOUS | Status: DC
  Administered 2018-07-31 – 2018-08-03 (×4): 1 g via INTRAVENOUS
  Filled 2018-07-30 (×4): qty 10

## 2018-07-30 NOTE — ED Notes (Addendum)
ED TO INPATIENT HANDOFF REPORT  ED Nurse Name and Phone #: Marisue Ivan [phone]  S Name/Age/Gender Amy Casey 58 y.o. female Room/Bed: 019C/019C  Code Status   Code Status: Prior  Home/SNF/Other Home Patient oriented to: self Is this baseline? No   Triage Complete: Triage complete  Chief Complaint uti  Triage Note Pt in with AMS x 3 days per husband. States she is just not herself, has had poor appetite x wk and not been taking psych meds. Believes it is the year 62. Frequent urination, has recurrent UTI's per husband.   Allergies Allergies  Allergen Reactions  . Paliperidone     Angioedema, drooling, slurred speech, ptosis  . Sulfa Antibiotics   . Lisinopril Rash    Angioedema     Level of Care/Admitting Diagnosis ED Disposition    ED Disposition Condition Comment   Admit  Hospital Area: [facility] [[id number]]  Level of Care: Telemetry Medical [104]  I expect the patient will be discharged within 24 hours: Yes  LOW acuity---Tx typically complete <24 hrs---ACUTE conditions typically can be evaluated <24 hours---LABS likely to return to acceptable levels <24 hours---IS near functional baseline---EXPECTED to return to current living arrangement---NOT newly hypoxic: Meets criteria for 5C-Observation unit  Covid Evaluation: N/A  Diagnosis: Acute metabolic encephalopathy [[id number]]  Admitting Physician: Marylyn Ishihara  Attending Physician: Marylyn Ishihara  Bed request comments: tele  PT Class (Do Not Modify): Observation [104]  PT Acc Code (Do Not Modify): Observation [10022]       B Medical/Surgery History Past Medical History:  Diagnosis Date  . Depression    Dr. Mila Homer, [facility]; hospitalization 07/2010  . History of echocardiogram 2011   [facility]  . History of renal stone   . Hypertension   . Hypokalemia   . Obesity   . Schizophrenia (HCC)   . Wears glasses    Past Surgical History:  Procedure Laterality Date  .  WISDOM TOOTH EXTRACTION       A IV Location/Drains/Wounds Patient Lines/Drains/Airways Status   Active Line/Drains/Airways    Name:   Placement date:   Placement time:   Site:   Days:   Peripheral IV 07/30/18 Left Forearm   07/30/18    1113    Forearm   less than 1   Peripheral IV 07/30/18 Left Forearm   07/30/18    1137    Forearm   less than 1          Intake/Output Last 24 hours  Intake/Output Summary (Last 24 hours) at 07/30/2018 1423 Last data filed at 07/30/2018 1350 Gross per 24 hour  Intake 300 ml  Output -  Net 300 ml    Labs/Imaging Results for orders placed or performed during the hospital encounter of 07/30/18 (from the past 48 hour(s))  Comprehensive metabolic panel     Status: Abnormal   Collection Time: 07/30/18  9:31 AM  Result Value Ref Range   Sodium 145 135 - 145 mmol/L   Potassium 2.5 (LL) 3.5 - 5.1 mmol/L    Comment: CRITICAL RESULT CALLED TO, READ BACK BY AND VERIFIED WITH: A JASPER RN [phone] BY A BENNETT    Chloride 99 98 - 111 mmol/L   CO2 32 22 - 32 mmol/L   Glucose, Bld 110 (H) 70 - 99 mg/dL   BUN 44 (H) 6 - 20 mg/dL   Creatinine, Ser 5.62 (H) 0.44 - 1.00 mg/dL   Calcium 9.6 8.9 - 13.0 mg/dL   Total  Protein 8.0 6.5 - 8.1 g/dL   Albumin 4.4 3.5 - 5.0 g/dL   AST 31 15 - 41 U/L   ALT 29 0 - 44 U/L   Alkaline Phosphatase 69 38 - 126 U/L   Total Bilirubin 1.0 0.3 - 1.2 mg/dL   GFR calc non Af Amer 35 (L) >60 mL/min   GFR calc Af Amer 41 (L) >60 mL/min   Anion gap 14 5 - 15    Comment: Performed at [facility], [address] [place], [place] [address]  CBC     Status: Abnormal   Collection Time: 07/30/18  9:31 AM  Result Value Ref Range   WBC 8.0 4.0 - 10.5 K/uL   RBC 4.99 3.87 - 5.11 MIL/uL   Hemoglobin 14.3 12.0 - 15.0 g/dL   HCT 40.9 81.1 - 91.4 %   MCV 90.8 80.0 - 100.0 fL   MCH 28.7 26.0 - 34.0 pg   MCHC 31.6 30.0 - 36.0 g/dL   RDW 78.2 95.6 - 21.3 %   Platelets 408 (H) 150 - 400 K/uL   nRBC 0.0 0.0 - 0.2 %     Comment: Performed at [facility], [address] [place], [place] [address]  CBG monitoring, ED     Status: None   Collection Time: 07/30/18 10:38 AM  Result Value Ref Range   Glucose-Capillary 82 70 - 99 mg/dL  Magnesium     Status: None   Collection Time: 07/30/18 11:11 AM  Result Value Ref Range   Magnesium 2.3 1.7 - 2.4 mg/dL    Comment: Performed at [facility], [address] [place], [place] [address]  Ethanol     Status: None   Collection Time: 07/30/18 11:11 AM  Result Value Ref Range   Alcohol, Ethyl (B) <10 <10 mg/dL    Comment: (NOTE) Lowest detectable limit for serum alcohol is 10 mg/dL. For medical purposes only. Performed at [facility], [address] [place], [place] [address]   Salicylate level     Status: None   Collection Time: 07/30/18 11:11 AM  Result Value Ref Range   Salicylate Lvl <7.0 2.8 - 30.0 mg/dL    Comment: Performed at [facility], [address], [place], [place] [address]  Acetaminophen level     Status: Abnormal   Collection Time: 07/30/18 11:11 AM  Result Value Ref Range   Acetaminophen (Tylenol), Serum <10 (L) 10 - 30 ug/mL    Comment: (NOTE) Therapeutic concentrations vary significantly. A range of 10-30 ug/mL  may be an effective concentration for many patients. However, some  are best treated at concentrations outside of this range. Acetaminophen concentrations >150 ug/mL at 4 hours after ingestion  and >50 ug/mL at 12 hours after ingestion are often associated with  toxic reactions. Performed at [facility], [address] [place], [place] [address]   Ammonia     Status: None   Collection Time: 07/30/18 11:11 AM  Result Value Ref Range   Ammonia 26 9 - 35 umol/L    Comment: Performed at [facility], [address], [place], [place] [address]  SARS Coronavirus 2 [facility] order, Performed in [facility] hospital lab)     Status: None   Collection Time: 07/30/18 11:11 AM  Result Value  Ref Range   SARS Coronavirus 2 NEGATIVE NEGATIVE    Comment: (NOTE) If result is NEGATIVE SARS-CoV-2 target nucleic acids are NOT  DETECTED. The SARS-CoV-2 RNA is generally detectable in upper and lower  respiratory specimens during the acute phase of infection. The lowest  concentration of SARS-CoV-2 viral copies this assay can detect is 250  copies / mL. A negative result does not preclude SARS-CoV-2 infection  and should not be used as the sole basis for treatment or other  patient management decisions.  A negative result may occur with  improper specimen collection / handling, submission of specimen other  than nasopharyngeal swab, presence of viral mutation(s) within the  areas targeted by this assay, and inadequate number of viral copies  (<250 copies / mL). A negative result must be combined with clinical  observations, patient history, and epidemiological information. If result is POSITIVE SARS-CoV-2 target nucleic acids are DETECTED. The SARS-CoV-2 RNA is generally detectable in upper and lower  respiratory specimens dur ing the acute phase of infection.  Positive  results are indicative of active infection with SARS-CoV-2.  Clinical  correlation with patient history and other diagnostic information is  necessary to determine patient infection status.  Positive results do  not rule out bacterial infection or co-infection with other viruses. If result is PRESUMPTIVE POSTIVE SARS-CoV-2 nucleic acids MAY BE PRESENT.   A presumptive positive result was obtained on the submitted specimen  and confirmed on repeat testing.  While 2019 novel coronavirus  (SARS-CoV-2) nucleic acids may be present in the submitted sample  additional confirmatory testing may be necessary for epidemiological  and / or clinical management purposes  to differentiate between  SARS-CoV-2 and other Sarbecovirus currently known to infect humans.  If clinically indicated additional testing with an alternate  test  methodology [phone]([id number]) is advised. The SARS-CoV-2 RNA is generally  detectable in upper and lower respiratory sp ecimens during the acute  phase of infection. The expected result is Negative. Fact Sheet for Patients:  BoilerBrush.com.cyhttps://www.fda.gov/media/136312/download Fact Sheet for Healthcare Providers: https://pope.com/https://www.fda.gov/media/136313/download This test is not yet approved or cleared by the [place] FDA and has been authorized for detection and/or diagnosis of SARS-CoV-2 by FDA under an Emergency Use Authorization (EUA).  This EUA will remain in effect (meaning this test can be used) for the duration of the COVID-19 declaration under Section 564(b)(1) of the Act, 21 U.S.C. section 360bbb-3(b)(1), unless the authorization is terminated or revoked sooner. Performed at [facility], [address] [place], [place] [address]   I-Stat beta hCG blood, ED     Status: None   Collection Time: 07/30/18 11:16 AM  Result Value Ref Range   I-stat hCG, quantitative <5.0 <5 mIU/mL   Comment 3            Comment:   GEST. AGE      CONC.  (mIU/mL)   <=1 WEEK        5 - 50     2 WEEKS       50 - 500     3 WEEKS       100 - 10,000     4 WEEKS     1,000 - 30,000        FEMALE AND NON-PREGNANT FEMALE:     LESS THAN 5 mIU/mL   Urine rapid drug screen (hosp performed)     Status: None   Collection Time: 07/30/18 11:17 AM  Result Value Ref Range   Opiates NONE DETECTED NONE DETECTED   Cocaine NONE DETECTED NONE DETECTED   Benzodiazepines NONE DETECTED NONE DETECTED   Amphetamines NONE DETECTED NONE DETECTED  Tetrahydrocannabinol NONE DETECTED NONE DETECTED   Barbiturates NONE DETECTED NONE DETECTED    Comment: (NOTE) DRUG SCREEN FOR MEDICAL PURPOSES ONLY.  IF CONFIRMATION IS NEEDED FOR ANY PURPOSE, NOTIFY LAB WITHIN 5 DAYS. LOWEST DETECTABLE LIMITS FOR URINE DRUG SCREEN Drug Class                     Cutoff (ng/mL) Amphetamine and metabolites    1000 Barbiturate and metabolites     200 Benzodiazepine                 200 Tricyclics and metabolites     300 Opiates and metabolites        300 Cocaine and metabolites        300 THC                            50 Performed at [facility], [address] [place], [place] [address]   Urinalysis, Routine w reflex microscopic     Status: Abnormal   Collection Time: 07/30/18 11:17 AM  Result Value Ref Range   Color, Urine AMBER (A) YELLOW    Comment: BIOCHEMICALS MAY BE AFFECTED BY COLOR   APPearance HAZY (A) CLEAR   Specific Gravity, Urine 1.019 1.005 - 1.030   pH 5.0 5.0 - 8.0   Glucose, UA NEGATIVE NEGATIVE mg/dL   Hgb urine dipstick NEGATIVE NEGATIVE   Bilirubin Urine NEGATIVE NEGATIVE   Ketones, ur 5 (A) NEGATIVE mg/dL   Protein, ur 30 (A) NEGATIVE mg/dL   Nitrite NEGATIVE NEGATIVE   Leukocytes,Ua LARGE (A) NEGATIVE   RBC / HPF 0-5 0 - 5 RBC/hpf   WBC, UA 6-10 0 - 5 WBC/hpf   Bacteria, UA FEW (A) NONE SEEN   Squamous Epithelial / LPF 0-5 0 - 5    Comment: Performed at [facility], [address], [place], [place] [address]   Ct Head Wo Contrast  Result Date: 07/30/2018 CLINICAL DATA:  Altered mental status. EXAM: CT HEAD WITHOUT CONTRAST TECHNIQUE: Contiguous axial images were obtained from the base of the skull through the vertex without intravenous contrast. COMPARISON:  CT scan dated 02/19/2014 MRI dated 02/21/2014 FINDINGS: Brain: There scattered areas of bilateral periventricular white matter lucency slightly progressed since the prior exams. There is no acute hemorrhage or mass lesion. Mild cerebral cortical atrophy. Vascular: No hyperdense vessel or unexpected calcification. Skull: Normal. Negative for fracture or focal lesion. Sinuses/Orbits: Normal. Other: None. IMPRESSION: No acute abnormality. Chronic slightly progressed moderate white matter disease likely chronic small vessel ischemic changes. Electronically Signed   By: Francene Boyers M.D.   On: 07/30/2018 12:12   Dg Chest Port 1  View  Result Date: 07/30/2018 CLINICAL DATA:  Altered mental status 3 days. History of schizophrenia not taking medication recently. EXAM: PORTABLE CHEST 1 VIEW COMPARISON:  11/09/2016 FINDINGS: Lungs are adequately inflated and otherwise clear. Cardiomediastinal silhouette and remainder of the exam is unchanged. IMPRESSION: No active disease. Electronically Signed   By: Elberta Fortis M.D.   On: 07/30/2018 11:26    Pending Labs Unresulted Labs (From admission, onward)    Start     Ordered   07/30/18 1200  Urine Culture  Add-on,   STAT     07/30/18 1159          Vitals/Pain Today's Vitals   07/30/18 1320 07/30/18 1330 07/30/18 1349 07/30/18 1351  BP:  (!) 186/90  Marland Kitchen)  186/90  Pulse:  74  80  Resp:  15  (!) 27  Temp:      TempSrc:      SpO2:  97%  100%  Weight:      PainSc: Asleep  Asleep     Isolation Precautions No active isolations  Medications Medications  potassium chloride 10 mEq in 100 mL IVPB (0 mEq Intravenous Stopped 07/30/18 1230)  cefTRIAXone (ROCEPHIN) 1 g in sodium chloride 0.9 % 100 mL IVPB (0 g Intravenous Stopped 07/30/18 1309)  potassium chloride 10 mEq in 100 mL IVPB (0 mEq Intravenous Stopped 07/30/18 1350)  sodium chloride 0.9 % bolus 1,000 mL (1,000 mLs Intravenous New Bag/Given 07/30/18 1348)    Mobility walks Moderate fall risk   Focused Assessments Neuro Assessment Handoff:  Swallow screen pass? No  Cardiac Rhythm: Normal sinus rhythm       Neuro Assessment: Exceptions to WDL Neuro Checks:      Last Documented NIHSS Modified Score:   Has TPA been given? No If patient is a Neuro Trauma and patient is going to OR before floor call report to 4N Charge nurse: [phone] or [phone]     R Recommendations: See Admitting Provider Note  Report given to: [facility] RN 5W  Additional Notes:

## 2018-07-30 NOTE — ED Notes (Signed)
Patient transported to CT 

## 2018-07-30 NOTE — ED Triage Notes (Signed)
Pt in with AMS x 3 days per husband. States she is just not herself, has had poor appetite x wk and not been taking psych meds. Believes it is the year 15. Frequent urination, has recurrent UTI's per husband.

## 2018-07-30 NOTE — H&P (Signed)
Patient Demographics:    Amy Casey, is a 58 y.o. female  MRN: [id number]   DOB - [birth year]  Admit Date - 07/30/2018  Outpatient Primary MD for the patient is Tysinger, Kermit Balo, PA-C   Assessment & Plan:    Principal Problem:   Acute metabolic encephalopathy Active Problems:   Paranoid schizophrenia (HCC)   Noncompliance   Acute lower UTI   Hypokalemia    1) acute toxic metabolic encephalopathy--- suspect most likely secondary to UTI compounded by dehydration and acute kidney injury, continue IV Rocephin pending urine culture... UA suggestive of UTI, , COVID-19 test is negative, serum ammonia is only 26, Tylenol and salicylate levels are not elevated, blood alcohol level is less than 10, CT head without acute intracranial findings, chest x-ray without acute cardiopulmonary findings  2) presumed UTI--- IV Rocephin pending cultures, see #1 above  3) hypokalemia--  potassium is 2.5 and magnesium normal at 2.3, Place potassium IV as patient is too lethargic to get oral intake safely  4)Depression and schizophrenia--- apparently patient has been noncompliant with Zyprexa and Prozac as well as lorazepam PTA----   5) hypertension--- table, restart amlodipine 5 mg daily, may use IV hydralazine ER and elevated BP  6)AKI--- baseline creatinine usually around 1.0 creatinine is up to 1.58 on admission, suspect due to dehydration in the setting of poor oral intake and lethargy,  hydrate IV  With History of - Reviewed by me  Past Medical History:  Diagnosis Date  . Depression    Dr. Mila Homer, [facility]; hospitalization 07/2010  . History of echocardiogram 2011   [facility]  . History of renal stone   . Hypertension   . Hypokalemia   . Obesity   . Schizophrenia (HCC)   . Wears glasses       Past Surgical  History:  Procedure Laterality Date  . WISDOM TOOTH EXTRACTION        Chief Complaint  Patient presents with  . Altered Mental Status  . Possible UTI      HPI:    Amy Casey  is a 58 y.o. female with past medical history relevant for hypertension, depressive disorder, history of recurrent UTI and h/o schizophrenia who presents with a 3-day history of altered mentation from home....  Patient is very lethargic, confused, disoriented--- most of the history obtained from available records and from prior conversations with patient's husband--  Apparently for the last 3 days patient has had poor appetite, she quit taking her psychiatric medications, came increasingly more disoriented, has been having urinary symptoms including urinary frequency  Pt in with AMS x 3 days per husband. States she is just not herself, has had poor appetite x wk and not been taking psych meds. Believes it is the year 20. Frequent urination, has recurrent UTI's per husband.   In ED--- UA suggestive of UTI, , COVID-19 test is negative, serum ammonia is only 26, Tylenol and salicylate levels are not  elevated, blood alcohol level is less than 10, potassium is 2.5 and magnesium normal at 2.3   patient is afebrile  CT head without acute intracranial findings, chest x-ray without acute cardiopulmonary findings  Patient received IV Rocephin in the ED on admission was requested by EDP    Review of systems:    In addition to the HPI above,   A full Review of  Systems was done, all other systems reviewed are negative except as noted above in HPI , .    Social History:  Reviewed by me    Social History   Tobacco Use  . Smoking status: Never Smoker  . Smokeless tobacco: Never Used  Substance Use Topics  . Alcohol use: No     Family History :  Reviewed by me    Family History  Problem Relation Age of Onset  . Diabetes Mother   . Kidney disease Mother        dialysis  . Heart disease Mother    . Cancer Maternal Uncle        lung  . Stroke Neg Hx   . Hypertension Neg Hx   . Hyperlipidemia Neg Hx     Home Medications:   Prior to Admission medications   Medication Sig Start Date End Date Taking? Authorizing Provider  amLODipine (NORVASC) 5 MG tablet Take 1 tablet (5 mg total) by mouth daily. 07/25/17 08/24/17  Arrien, York RamMauricio Daniel, MD  FLUoxetine (PROZAC) 40 MG capsule Take 1 capsule (40 mg total) by mouth daily. 07/25/16   Tysinger, Kermit Baloavid S, PA-C  ibuprofen (ADVIL,MOTRIN) 600 MG tablet Take 1 tablet (600 mg total) by mouth every 6 (six) hours as needed. 01/26/18   Elpidio AnisUpstill, Shari, PA-C  LORazepam (ATIVAN) 0.5 MG tablet Take 0.5 mg by mouth daily as needed. 11/23/16   [provider]  methocarbamol (ROBAXIN) 500 MG tablet Take 1 tablet (500 mg total) by mouth 4 (four) times daily. 12/31/16   Fisher, Roselyn BeringSusan W, PA-C  OLANZapine (ZYPREXA) 15 MG tablet Take 15 mg by mouth at bedtime. 11/23/16   [provider]  potassium chloride 20 MEQ TBCR Take 20 mEq by mouth daily for 7 days. 01/20/18 01/27/18  Caccavale, Sophia, PA-C     Allergies:     Allergies  Allergen Reactions  . Paliperidone     Angioedema, drooling, slurred speech, ptosis  . Sulfa Antibiotics   . Lisinopril Rash    Angioedema      Physical Exam:   Vitals  Blood pressure (!) 157/61, pulse 72, temperature 98.5 F (36.9 C), temperature source Oral, resp. rate (!) 27, weight 77.1 kg, SpO2 96 %.  Physical Examination: General appearance -lethargic and in no distress   Eyes - sclera anicteric Neck - supple, no JVD elevation , Chest - clear  to auscultation bilaterally, symmetrical air movement,  Heart - S1 and S2 normal, regular  Abdomen - soft, nontender, nondistended, no CVA area tenderness Neurological -sleepy, neck supple without rigidity, cranial nerves II through XII intact, DTR's normal and symmetric... Able to follow simple commands moving all extremities well Extremities - no pedal edema  noted, intact peripheral pulses  Skin - warm, dry     Data Review:    CBC Recent Labs  Lab 07/30/18 0931  WBC 8.0  HGB 14.3  HCT 45.3  PLT 408*  MCV 90.8  MCH 28.7  MCHC 31.6  RDW 12.7   ------------------------------------------------------------------------------------------------------------------  Chemistries  Recent Labs  Lab 07/30/18 0931 07/30/18 1111  NA 145  --   K 2.5*  --   CL 99  --   CO2 32  --   GLUCOSE 110*  --   BUN 44*  --   CREATININE 1.58*  --   CALCIUM 9.6  --   MG  --  2.3  AST 31  --   ALT 29  --   ALKPHOS 69  --   BILITOT 1.0  --    ------------------------------------------------------------------------------------------------------------------ CrCl cannot be calculated (Unknown ideal weight.). ------------------------------------------------------------------------------------------------------------------ No results for input(s): TSH, T4TOTAL, T3FREE, THYROIDAB in the last 72 hours.  Invalid input(s): FREET3   Coagulation profile No results for input(s): INR, PROTIME in the last 168 hours. ------------------------------------------------------------------------------------------------------------------- No results for input(s): DDIMER in the last 72 hours. -------------------------------------------------------------------------------------------------------------------  Cardiac Enzymes No results for input(s): CKMB, TROPONINI, MYOGLOBIN in the last 168 hours.  Invalid input(s): CK ------------------------------------------------------------------------------------------------------------------ No results found for: BNP   ---------------------------------------------------------------------------------------------------------------  Urinalysis    Component Value Date/Time   COLORURINE AMBER (A) 07/30/2018 1117   APPEARANCEUR HAZY (A) 07/30/2018 1117   LABSPEC 1.019 07/30/2018 1117   PHURINE 5.0 07/30/2018 1117    GLUCOSEU NEGATIVE 07/30/2018 1117   HGBUR NEGATIVE 07/30/2018 1117   BILIRUBINUR NEGATIVE 07/30/2018 1117   BILIRUBINUR n 06/02/2016 1251   KETONESUR 5 (A) 07/30/2018 1117   PROTEINUR 30 (A) 07/30/2018 1117   UROBILINOGEN negative 06/02/2016 1251   UROBILINOGEN 1.0 02/19/2014 1849   NITRITE NEGATIVE 07/30/2018 1117   LEUKOCYTESUR LARGE (A) 07/30/2018 1117    ----------------------------------------------------------------------------------------------------------------   Imaging Results:    Ct Head Wo Contrast  Result Date: 07/30/2018 CLINICAL DATA:  Altered mental status. EXAM: CT HEAD WITHOUT CONTRAST TECHNIQUE: Contiguous axial images were obtained from the base of the skull through the vertex without intravenous contrast. COMPARISON:  CT scan dated 02/19/2014 MRI dated 02/21/2014 FINDINGS: Brain: There scattered areas of bilateral periventricular white matter lucency slightly progressed since the prior exams. There is no acute hemorrhage or mass lesion. Mild cerebral cortical atrophy. Vascular: No hyperdense vessel or unexpected calcification. Skull: Normal. Negative for fracture or focal lesion. Sinuses/Orbits: Normal. Other: None. IMPRESSION: No acute abnormality. Chronic slightly progressed moderate white matter disease likely chronic small vessel ischemic changes. Electronically Signed   By: Francene Boyers M.D.   On: 07/30/2018 12:12   Dg Chest Port 1 View  Result Date: 07/30/2018 CLINICAL DATA:  Altered mental status 3 days. History of schizophrenia not taking medication recently. EXAM: PORTABLE CHEST 1 VIEW COMPARISON:  11/09/2016 FINDINGS: Lungs are adequately inflated and otherwise clear. Cardiomediastinal silhouette and remainder of the exam is unchanged. IMPRESSION: No active disease. Electronically Signed   By: Elberta Fortis M.D.   On: 07/30/2018 11:26    Radiological Exams on Admission: Ct Head Wo Contrast  Result Date: 07/30/2018 CLINICAL DATA:  Altered mental status. EXAM:  CT HEAD WITHOUT CONTRAST TECHNIQUE: Contiguous axial images were obtained from the base of the skull through the vertex without intravenous contrast. COMPARISON:  CT scan dated 02/19/2014 MRI dated 02/21/2014 FINDINGS: Brain: There scattered areas of bilateral periventricular white matter lucency slightly progressed since the prior exams. There is no acute hemorrhage or mass lesion. Mild cerebral cortical atrophy. Vascular: No hyperdense vessel or unexpected calcification. Skull: Normal. Negative for fracture or focal lesion. Sinuses/Orbits: Normal. Other: None. IMPRESSION: No acute abnormality. Chronic slightly progressed moderate white matter disease likely chronic small vessel ischemic changes. Electronically Signed   By: Francene Boyers M.D.   On: 07/30/2018 12:12   Dg Chest  Port 1 View  Result Date: 07/30/2018 CLINICAL DATA:  Altered mental status 3 days. History of schizophrenia not taking medication recently. EXAM: PORTABLE CHEST 1 VIEW COMPARISON:  11/09/2016 FINDINGS: Lungs are adequately inflated and otherwise clear. Cardiomediastinal silhouette and remainder of the exam is unchanged. IMPRESSION: No active disease. Electronically Signed   By: Elberta Fortis M.D.   On: 07/30/2018 11:26    DVT Prophylaxis -SCD/heparin AM Labs Ordered, also please review Full Orders  Family Communication: Admission, patients condition and plan of care including tests being ordered have been discussed with the patient and husband who indicate understanding and agree with the plan   Code Status - Full Code  Likely DC to  TBD  Condition   fair  Shon Hale M.D on 07/30/2018 at 8:26 PM Go to [email or web address] -  for contact info  [facility] - Office  [phone]

## 2018-07-30 NOTE — ED Provider Notes (Signed)
[facility] Provider Note   CSN: [id number] Arrival date & time: 07/30/18  0915    History   Chief Complaint Chief Complaint  Patient presents with  . Altered Mental Status  . Possible UTI    HPI Amy Casey is a 58 y.o. female with a PMH of depression, schizophrenia, hypokalemia, and HTN presenting with altered mental status for 3 days. Patient does not provide any history. Patient is a poor historian due to altered mental status. Patient denies any acute complaints.  Level 5 caveat.   Husband reports patient has had a poor appetite for 1 week and has not been compliant with her psychiatric medications. Husband reports frequent urination and states patient has had recurrent UTIs in the past.       HPI  Past Medical History:  Diagnosis Date  . Depression    Dr. Mila HomerSena, [facility]; hospitalization 07/2010  . History of echocardiogram 2011   [facility]  . History of renal stone   . Hypertension   . Hypokalemia   . Obesity   . Schizophrenia (HCC)   . Wears glasses     Patient Active Problem List   Diagnosis Date Noted  . Acute metabolic encephalopathy 07/30/2018  . Acute lower UTI   . AKI (acute kidney injury) (HCC)   . Hypokalemia   . Dehydration 07/19/2017  . Hypernatremia 07/19/2017  . UTI (urinary tract infection) 07/19/2017  . Schizophrenia (HCC) 07/18/2017  . High risk medication use 07/25/2016  . Involuntary commitment   . Paranoid schizophrenia (HCC) 06/16/2015  . Noncompliance 06/16/2015  . Altered awareness, transient   . Altered mental status 02/19/2014  . Essential hypertension, benign 02/10/2012  . Tooth decay 02/10/2012  . Obesity 02/10/2012  . Impaired fasting blood sugar 02/10/2012    Past Surgical History:  Procedure Laterality Date  . WISDOM TOOTH EXTRACTION       OB History   No obstetric history on file.      Home Medications    Prior to Admission medications   Medication Sig Start Date  End Date Taking? Authorizing Provider  amLODipine (NORVASC) 5 MG tablet Take 1 tablet (5 mg total) by mouth daily. 07/25/17 08/24/17  Arrien, York RamMauricio Daniel, MD  FLUoxetine (PROZAC) 40 MG capsule Take 1 capsule (40 mg total) by mouth daily. 07/25/16   Tysinger, Kermit Baloavid S, PA-C  ibuprofen (ADVIL,MOTRIN) 600 MG tablet Take 1 tablet (600 mg total) by mouth every 6 (six) hours as needed. 01/26/18   Elpidio AnisUpstill, Shari, PA-C  LORazepam (ATIVAN) 0.5 MG tablet Take 0.5 mg by mouth daily as needed. 11/23/16   [provider]  methocarbamol (ROBAXIN) 500 MG tablet Take 1 tablet (500 mg total) by mouth 4 (four) times daily. 12/31/16   Fisher, Roselyn BeringSusan W, PA-C  OLANZapine (ZYPREXA) 15 MG tablet Take 15 mg by mouth at bedtime. 11/23/16   [provider]  potassium chloride 20 MEQ TBCR Take 20 mEq by mouth daily for 7 days. 01/20/18 01/27/18  Caccavale, Sophia, PA-C    Family History Family History  Problem Relation Age of Onset  . Diabetes Mother   . Kidney disease Mother        dialysis  . Heart disease Mother   . Cancer Maternal Uncle        lung  . Stroke Neg Hx   . Hypertension Neg Hx   . Hyperlipidemia Neg Hx     Social History Social History   Tobacco Use  . Smoking  status: Never Smoker  . Smokeless tobacco: Never Used  Substance Use Topics  . Alcohol use: No  . Drug use: No     Allergies   Paliperidone; Sulfa antibiotics; and Lisinopril   Review of Systems Review of Systems  Unable to perform ROS: Mental status change     Physical Exam Updated Vital Signs BP (!) 186/90 (BP Location: Right Arm)   Pulse 80   Temp 98.2 F (36.8 C) (Oral)   Resp (!) 27   Wt 77.1 kg   SpO2 100%   BMI 24.04 kg/m   Physical Exam Vitals signs and nursing note reviewed.  Constitutional:      General: She is not in acute distress.    Appearance: She is well-developed. She is not diaphoretic.     Comments: Patient only responds to a few questions and has a distant stare.   HENT:      Head: Normocephalic and atraumatic.     Right Ear: Tympanic membrane, ear canal and external ear normal.     Left Ear: Tympanic membrane, ear canal and external ear normal.     Mouth/Throat:     Mouth: Mucous membranes are moist.     Pharynx: No posterior oropharyngeal erythema.  Eyes:     Extraocular Movements: Extraocular movements intact.     Conjunctiva/sclera: Conjunctivae normal.     Pupils: Pupils are equal, round, and reactive to light.  Neck:     Musculoskeletal: Normal range of motion.  Cardiovascular:     Rate and Rhythm: Normal rate and regular rhythm.     Heart sounds: Normal heart sounds. No murmur. No friction rub. No gallop.   Pulmonary:     Effort: Pulmonary effort is normal. No respiratory distress.     Breath sounds: Normal breath sounds. No wheezing or rales.  Abdominal:     Palpations: Abdomen is soft.     Tenderness: There is no abdominal tenderness.  Musculoskeletal: Normal range of motion.  Skin:    General: Skin is warm.     Coloration: Skin is not jaundiced.     Findings: No erythema or rash.  Neurological:     Mental Status: She is alert.  Psychiatric:        Mood and Affect: Affect is flat.        Speech: Speech is delayed.        Behavior: Behavior is withdrawn.    Mental Status:  Alert. Slow to answer, but oriented to person, place, time, and president. Patient is not able to give a coherent history. Patient not able to follow some 2 step commands. Cranial Nerves:  II:  Peripheral visual fields grossly normal, pupils equal, round, reactive to light III,IV, VI: ptosis not present, extra-ocular motions intact bilaterally  V,VII: smile symmetric, facial light touch sensation equal VIII: hearing grossly normal to voice  IX,X: symmetric elevation of soft palate, uvula elevates symmetrically  XI: bilateral shoulder shrug symmetric and strong XII: midline tongue extension without fassiculations Motor:  Normal tone. 5/5 in upper and lower extremities  bilaterally including strong and equal grip strength and dorsiflexion/plantar flexion Sensory: light touch normal in all extremities.  Deep Tendon Reflexes: 2+ and symmetric in the biceps and patella Cerebellar: unable to assess due to mental status change and lack of cooperation Gait: unable to assess due to mental status change and lack of cooperation CV: distal pulses palpable throughout     ED Treatments / Results  Labs (all labs ordered are listed,  but only abnormal results are displayed) Labs Reviewed  COMPREHENSIVE METABOLIC PANEL - Abnormal; Notable for the following components:      Result Value   Potassium 2.5 (*)    Glucose, Bld 110 (*)    BUN 44 (*)    Creatinine, Ser 1.58 (*)    GFR calc non Af Amer 35 (*)    GFR calc Af Amer 41 (*)    All other components within normal limits  CBC - Abnormal; Notable for the following components:   Platelets 408 (*)    All other components within normal limits  URINALYSIS, ROUTINE W REFLEX MICROSCOPIC - Abnormal; Notable for the following components:   Color, Urine AMBER (*)    APPearance HAZY (*)    Ketones, ur 5 (*)    Protein, ur 30 (*)    Leukocytes,Ua LARGE (*)    Bacteria, UA FEW (*)    All other components within normal limits  ACETAMINOPHEN LEVEL - Abnormal; Notable for the following components:   Acetaminophen (Tylenol), Serum <10 (*)    All other components within normal limits  SARS CORONAVIRUS 2 (HOSPITAL ORDER, PERFORMED IN [facility])  URINE CULTURE  MAGNESIUM  RAPID URINE DRUG SCREEN, HOSP PERFORMED  ETHANOL  SALICYLATE LEVEL  AMMONIA  CBG MONITORING, ED  I-STAT BETA HCG BLOOD, ED ([facility], [facility], [facility] ONLY)    EKG EKG Interpretation  Date/Time:  Monday Jul 30 2018 10:54:55 EDT Ventricular Rate:  74 PR Interval:    QRS Duration: 103 QT Interval:  430 QTC Calculation: 478 R Axis:   37 Text Interpretation:  Sinus rhythm Ventricular premature complex Right atrial enlargement Probable LVH with  secondary repol abnrm Artifact in lead(s) I II III aVR aVL background noise ?prolonged qt Confirmed by Melene Plan [phone]) on 07/30/2018 11:02:39 AM   Radiology Ct Head Wo Contrast  Result Date: 07/30/2018 CLINICAL DATA:  Altered mental status. EXAM: CT HEAD WITHOUT CONTRAST TECHNIQUE: Contiguous axial images were obtained from the base of the skull through the vertex without intravenous contrast. COMPARISON:  CT scan dated 02/19/2014 MRI dated 02/21/2014 FINDINGS: Brain: There scattered areas of bilateral periventricular white matter lucency slightly progressed since the prior exams. There is no acute hemorrhage or mass lesion. Mild cerebral cortical atrophy. Vascular: No hyperdense vessel or unexpected calcification. Skull: Normal. Negative for fracture or focal lesion. Sinuses/Orbits: Normal. Other: None. IMPRESSION: No acute abnormality. Chronic slightly progressed moderate white matter disease likely chronic small vessel ischemic changes. Electronically Signed   By: Francene Boyers M.D.   On: 07/30/2018 12:12   Dg Chest Port 1 View  Result Date: 07/30/2018 CLINICAL DATA:  Altered mental status 3 days. History of schizophrenia not taking medication recently. EXAM: PORTABLE CHEST 1 VIEW COMPARISON:  11/09/2016 FINDINGS: Lungs are adequately inflated and otherwise clear. Cardiomediastinal silhouette and remainder of the exam is unchanged. IMPRESSION: No active disease. Electronically Signed   By: Elberta Fortis M.D.   On: 07/30/2018 11:26    Procedures Procedures (including critical care time)  Medications Ordered in ED Medications  potassium chloride 10 mEq in 100 mL IVPB (0 mEq Intravenous Stopped 07/30/18 1230)  cefTRIAXone (ROCEPHIN) 1 g in sodium chloride 0.9 % 100 mL IVPB (0 g Intravenous Stopped 07/30/18 1309)  potassium chloride 10 mEq in 100 mL IVPB (0 mEq Intravenous Stopped 07/30/18 1350)  sodium chloride 0.9 % bolus 1,000 mL (1,000 mLs Intravenous New Bag/Given 07/30/18 1348)     Initial  Impression / Assessment and Plan /  ED Course  I have reviewed the triage vital signs and the nursing notes.  Pertinent labs & imaging results that were available during my care of the patient were reviewed by me and considered in my medical decision making (see chart for details).  Clinical Course as of Jul 29 1416  Mon Jul 30, 2018  1125 Called and updated husband. Husband provided additional information. Husband denies fever, cough, congestion, nausea, vomiting, diarrhea, abdominal pain, shortness or breath, or chest pain. Husband denies alcohol, drug, or tobacco use. Husband report he has noticed she has been acting abnormal and confused over the last few days. Husband states patient was last seen at baseline 6 days. Husband reports medication non compliance for 3-4 days. Husband denies hallucinations.   [AH]  1159 No active disease noted on CXR.  DG Chest Port 1 View [AH]  1217 No acute abnormality noted on head CT. Chronic slightly progressed moderate white matter disease likely chronic small vessel ischemic changes.    CT Head Wo Contrast [AH]  1232 UA reveals large leukocytes, few bacteria, and WBCs. Will order urine culture and treat with antibiotics.   Urinalysis, Routine w reflex microscopic(!) [AH]  1334 Creatinine elevated at 1.58. Will provide IVF.  Creatinine(!): 1.58 [AH]  1342 Hypokalemia noted at 2.5. Provided IV potassium.  Potassium(!!): 2.5 [AH]    Clinical Course User Index [AH] Leretha Dykes, PA-C      Patient presents with altered mental status. Labs, vitals, and imaging reviewed. CT is negative for acute abnormality. UA reveals leukocytes, few bacteria, and WBCs. Husband reports urinary frequency and recurrent UTIs, therefore, treated as UTI with antibiotics. Ordered urine culture. Provided IVF. Hypokalemia noted at 2.5. Supplemented with IV potassium. Patient is not able to tolerate PO potassium due to her altered mental status. Suspect altered mental status may  be due to non compliance with psychiatric medications. Patient continues to be altered and only answers some questions. Will consult hospitalist for admission. Hospitalist has agreed to admit patient.    Findings and plan of care discussed with supervising physician Dr. Adela Lank.  Final Clinical Impressions(s) / ED Diagnoses   Final diagnoses:  Altered mental status, unspecified altered mental status type  Hypokalemia    ED Discharge Orders    None       Leretha Dykes, PA-C 07/30/18 1420    Melene Plan, DO 07/30/18 1513

## 2018-07-30 NOTE — ED Notes (Signed)
Nurse Navigator made call to husband with intention of giving him update.  Message left and sent text.  Pt is resting

## 2018-07-30 NOTE — Progress Notes (Signed)
Patient home meds given to husband [employer] school teacher)

## 2018-07-30 NOTE — ED Notes (Signed)
Pt returned from CT °

## 2018-07-31 DIAGNOSIS — Z9114 Patient's other noncompliance with medication regimen: Secondary | ICD-10-CM | POA: Diagnosis not present

## 2018-07-31 DIAGNOSIS — E876 Hypokalemia: Secondary | ICD-10-CM

## 2018-07-31 DIAGNOSIS — N390 Urinary tract infection, site not specified: Secondary | ICD-10-CM | POA: Diagnosis present

## 2018-07-31 DIAGNOSIS — R3915 Urgency of urination: Secondary | ICD-10-CM | POA: Diagnosis not present

## 2018-07-31 DIAGNOSIS — F319 Bipolar disorder, unspecified: Secondary | ICD-10-CM | POA: Diagnosis present

## 2018-07-31 DIAGNOSIS — R634 Abnormal weight loss: Secondary | ICD-10-CM | POA: Diagnosis not present

## 2018-07-31 DIAGNOSIS — Z9119 Patient's noncompliance with other medical treatment and regimen: Secondary | ICD-10-CM | POA: Diagnosis not present

## 2018-07-31 DIAGNOSIS — N179 Acute kidney failure, unspecified: Secondary | ICD-10-CM | POA: Diagnosis present

## 2018-07-31 DIAGNOSIS — R5381 Other malaise: Secondary | ICD-10-CM | POA: Diagnosis not present

## 2018-07-31 DIAGNOSIS — Z8249 Family history of ischemic heart disease and other diseases of the circulatory system: Secondary | ICD-10-CM | POA: Diagnosis not present

## 2018-07-31 DIAGNOSIS — G9340 Encephalopathy, unspecified: Secondary | ICD-10-CM | POA: Diagnosis not present

## 2018-07-31 DIAGNOSIS — F333 Major depressive disorder, recurrent, severe with psychotic symptoms: Secondary | ICD-10-CM | POA: Diagnosis not present

## 2018-07-31 DIAGNOSIS — Z87442 Personal history of urinary calculi: Secondary | ICD-10-CM | POA: Diagnosis not present

## 2018-07-31 DIAGNOSIS — E875 Hyperkalemia: Secondary | ICD-10-CM | POA: Diagnosis present

## 2018-07-31 DIAGNOSIS — Z87440 Personal history of urinary (tract) infections: Secondary | ICD-10-CM | POA: Diagnosis not present

## 2018-07-31 DIAGNOSIS — E873 Alkalosis: Secondary | ICD-10-CM | POA: Diagnosis present

## 2018-07-31 DIAGNOSIS — F200 Paranoid schizophrenia: Secondary | ICD-10-CM | POA: Diagnosis not present

## 2018-07-31 DIAGNOSIS — Z882 Allergy status to sulfonamides status: Secondary | ICD-10-CM | POA: Diagnosis not present

## 2018-07-31 DIAGNOSIS — F202 Catatonic schizophrenia: Secondary | ICD-10-CM | POA: Diagnosis not present

## 2018-07-31 DIAGNOSIS — Z1159 Encounter for screening for other viral diseases: Secondary | ICD-10-CM | POA: Diagnosis not present

## 2018-07-31 DIAGNOSIS — F4310 Post-traumatic stress disorder, unspecified: Secondary | ICD-10-CM | POA: Diagnosis not present

## 2018-07-31 DIAGNOSIS — F329 Major depressive disorder, single episode, unspecified: Secondary | ICD-10-CM | POA: Diagnosis not present

## 2018-07-31 DIAGNOSIS — R110 Nausea: Secondary | ICD-10-CM | POA: Diagnosis not present

## 2018-07-31 DIAGNOSIS — I10 Essential (primary) hypertension: Secondary | ICD-10-CM | POA: Diagnosis not present

## 2018-07-31 DIAGNOSIS — Z79899 Other long term (current) drug therapy: Secondary | ICD-10-CM | POA: Diagnosis not present

## 2018-07-31 DIAGNOSIS — E860 Dehydration: Secondary | ICD-10-CM | POA: Diagnosis present

## 2018-07-31 DIAGNOSIS — Z888 Allergy status to other drugs, medicaments and biological substances status: Secondary | ICD-10-CM | POA: Diagnosis not present

## 2018-07-31 DIAGNOSIS — R531 Weakness: Secondary | ICD-10-CM | POA: Diagnosis not present

## 2018-07-31 DIAGNOSIS — G92 Toxic encephalopathy: Secondary | ICD-10-CM | POA: Diagnosis present

## 2018-07-31 DIAGNOSIS — Z818 Family history of other mental and behavioral disorders: Secondary | ICD-10-CM | POA: Diagnosis not present

## 2018-07-31 DIAGNOSIS — F22 Delusional disorders: Secondary | ICD-10-CM | POA: Diagnosis not present

## 2018-07-31 DIAGNOSIS — G9341 Metabolic encephalopathy: Secondary | ICD-10-CM | POA: Diagnosis not present

## 2018-07-31 LAB — BASIC METABOLIC PANEL
Anion gap: 14 (ref 5–15)
BUN: 25 mg/dL — ABNORMAL HIGH (ref 6–20)
CO2: 27 mmol/L (ref 22–32)
Calcium: 9.3 mg/dL (ref 8.9–10.3)
Chloride: 101 mmol/L (ref 98–111)
Creatinine, Ser: 0.98 mg/dL (ref 0.44–1.00)
GFR calc Af Amer: >60 mL/min (ref >60)
GFR calc non Af Amer: >60 mL/min (ref >60)
Glucose, Bld: 135 mg/dL — ABNORMAL HIGH (ref 70–99)
Potassium: 3.1 mmol/L — ABNORMAL LOW (ref 3.5–5.1)
Sodium: 142 mmol/L (ref 135–145)

## 2018-07-31 LAB — HIV ANTIBODY (ROUTINE TESTING W REFLEX): HIV Screen 4th Generation wRfx: NONREACTIVE

## 2018-07-31 LAB — CBC
HCT: 46.1 % — ABNORMAL HIGH (ref 36.0–46.0)
Hemoglobin: 14.8 g/dL (ref 12.0–15.0)
MCH: 28.7 pg (ref 26.0–34.0)
MCHC: 32.1 g/dL (ref 30.0–36.0)
MCV: 89.5 fL (ref 80.0–100.0)
Platelets: 395 10*3/uL (ref 150–400)
RBC: 5.15 MIL/uL — ABNORMAL HIGH (ref 3.87–5.11)
RDW: 12.6 % (ref 11.5–15.5)
WBC: 7.8 10*3/uL (ref 4.0–10.5)
nRBC: 0 % (ref 0.0–0.2)

## 2018-07-31 LAB — URINE CULTURE

## 2018-07-31 MED ORDER — POTASSIUM CHLORIDE CRYS ER 20 MEQ PO TBCR
40.0000 meq | EXTENDED_RELEASE_TABLET | ORAL | Status: AC
  Filled 2018-07-31: qty 2

## 2018-07-31 MED ORDER — OLANZAPINE 7.5 MG PO TABS
15.0000 mg | ORAL_TABLET | Freq: Every day | ORAL | Status: DC
  Filled 2018-07-31: qty 2

## 2018-07-31 MED ORDER — ENOXAPARIN SODIUM 40 MG/0.4ML ~~LOC~~ SOLN
40.0000 mg | SUBCUTANEOUS | Status: DC
  Administered 2018-08-04 – 2018-08-09 (×5): 40 mg via SUBCUTANEOUS
  Filled 2018-07-31 (×9): qty 0.4

## 2018-07-31 NOTE — Progress Notes (Signed)
Patient is refusing all oral medications.  

## 2018-07-31 NOTE — Progress Notes (Signed)
PROGRESS NOTE    Amy Casey  AQL:[id number] DOB: 08/22/1960 DOA: 07/30/2018 PCP: Jac Canavan, PA-C    Brief Narrative:  58 year old female who presented on mental status.  She has significant past medical for hypertension, depression, schizophrenia and recurrent urinary tract infections.  He was unable to give any detailed history due to lethargy confusion and disorientation.  Per her husband she had 3 days of altered mentation, poor appetite, she quit taking her psychiatric medications.  Increased urinary frequency.  Her initial physical examination blood pressure was 151/61, pulse rate 72, temperature 98.5, respiratory 27, oxygen saturation 96%.  Her lungs were clear to auscultation bilaterally, heart S1-S2 present and rhythmic, abdomen soft, no lower extremity edema.  Urinalysis had 6-10 white cells, 0-5 red cells.  Chest x-ray negative for infiltrates, CT head negative for acute changes.   Patient was admitted to the hospital with a working diagnosis of acute toxic/ metabolic encephalopathy.  Assessment & Plan:   Principal Problem:   Acute metabolic encephalopathy Active Problems:   Paranoid schizophrenia (HCC)   Noncompliance   Acute lower UTI   Hypokalemia  1. Acute toxic/ metabolic encephalopathy in the setting of suspected urinary tract infection. Patient continue to have significant altered mental status, different from her baseline. Disorientated in time and situation. Very poor oral intake, will continue supportive IV fluids and IV antibiotic therapy with ceftriaxone. Continue neuro checks q 4 H, and aspiration precautions. Follow up cultures, cell count and temperature curve.   2. AKI with hypokalemia/ contraction alkalosis. Renal function with serum cr at 0.98 with K at 3,1 and serum bicarbonate at 27. Will continue IV fluids for hydration, patient clinically dry with poor oral intake, related to her encephalopathy. Continue K correction with Kcl po and IV, follow  renal panel in am.   3. Paranoid schizophrenia. Patient not much interactive, poorly responsive. Continue fluoxetine, as needed trazodone and lorazepam. Continue neuro checks.   4. HTN. Continue blood pressure control with amlodipine.     DVT prophylaxis: enoxaparin   Code Status: full Family Communication: no family at the bedside  Disposition Plan/ discharge barriers: pending clinical improvement.   Body mass index is 24.04 kg/m. Malnutrition Type:      Malnutrition Characteristics:      Nutrition Interventions:     RN Pressure Injury Documentation:     Consultants:     Procedures:     Antimicrobials:       Subjective: Patient continue to be disorientated to time and situation, poorly interactive and poor oral intake.   Objective: Vitals:   07/30/18 1858 07/30/18 2202 07/31/18 0624 07/31/18 0956  BP: (!) 157/61 (!) 147/67 (!) 182/102 (!) 159/70  Pulse:  80 72 72  Resp:      Temp:  98.7 F (37.1 C) 98.3 F (36.8 C)   TempSrc:  Oral Oral   SpO2:  100% 98% 100%  Weight:        Intake/Output Summary (Last 24 hours) at 07/31/2018 1250 Last data filed at 07/31/2018 1157 Gross per 24 hour  Intake 296.36 ml  Output --  Net 296.36 ml   Filed Weights   07/30/18 0925  Weight: 77.1 kg    Examination:   General: deconditioned and ill looking appearing.  Neurology: Awake and non focal, very slow to respond to questions, disorientated to time and situation.   E ENT: no pallor, no icterus, oral mucosa very dry.  Cardiovascular: No JVD. S1-S2 present, rhythmic, no gallops, rubs, or murmurs. No  lower extremity edema. Pulmonary: positive breath sounds bilaterally, adequate air movement, no wheezing, rhonchi or rales. Gastrointestinal. Abdomen with no organomegaly, non tender, no rebound or guarding Skin. No rashes Musculoskeletal: no joint deformities     Data Reviewed: I have personally reviewed following labs and imaging studies  CBC: Recent  Labs  Lab 07/30/18 0931 07/31/18 0637  WBC 8.0 7.8  HGB 14.3 14.8  HCT 45.3 46.1*  MCV 90.8 89.5  PLT 408* 395   Basic Metabolic Panel: Recent Labs  Lab 07/30/18 0931 07/30/18 1111 07/31/18 0637  NA 145  --  142  K 2.5*  --  3.1*  CL 99  --  101  CO2 32  --  27  GLUCOSE 110*  --  135*  BUN 44*  --  25*  CREATININE 1.58*  --  0.98  CALCIUM 9.6  --  9.3  MG  --  2.3  --    GFR: CrCl cannot be calculated (Unknown ideal weight.). Liver Function Tests: Recent Labs  Lab 07/30/18 0931  AST 31  ALT 29  ALKPHOS 69  BILITOT 1.0  PROT 8.0  ALBUMIN 4.4   No results for input(s): LIPASE, AMYLASE in the last 168 hours. Recent Labs  Lab 07/30/18 1111  AMMONIA 26   Coagulation Profile: No results for input(s): INR, PROTIME in the last 168 hours. Cardiac Enzymes: No results for input(s): CKTOTAL, CKMB, CKMBINDEX, TROPONINI in the last 168 hours. BNP (last 3 results) No results for input(s): PROBNP in the last 8760 hours. HbA1C: No results for input(s): HGBA1C in the last 72 hours. CBG: Recent Labs  Lab 07/30/18 1038  GLUCAP 82   Lipid Profile: No results for input(s): CHOL, HDL, LDLCALC, TRIG, CHOLHDL, LDLDIRECT in the last 72 hours. Thyroid Function Tests: No results for input(s): TSH, T4TOTAL, FREET4, T3FREE, THYROIDAB in the last 72 hours. Anemia Panel: No results for input(s): VITAMINB12, FOLATE, FERRITIN, TIBC, IRON, RETICCTPCT in the last 72 hours.    Radiology Studies: I have reviewed all of the imaging during this hospital visit personally     Scheduled Meds:  amLODipine  5 mg Oral Daily   FLUoxetine  20 mg Oral Daily   heparin  5,000 Units Subcutaneous Q8H   sodium chloride flush  3 mL Intravenous Q12H   Continuous Infusions:  sodium chloride     cefTRIAXone (ROCEPHIN)  IV 1 g (07/31/18 1154)   dextrose 5 % and 0.45% NaCl 125 mL/hr at 07/31/18 0148     LOS: 0 days        Mattheo Swindle Annett Gulaaniel Diego Ulbricht, MD

## 2018-08-01 LAB — BASIC METABOLIC PANEL
Anion gap: 12 (ref 5–15)
BUN: 14 mg/dL (ref 6–20)
CO2: 29 mmol/L (ref 22–32)
Calcium: 8.7 mg/dL — ABNORMAL LOW (ref 8.9–10.3)
Chloride: 100 mmol/L (ref 98–111)
Creatinine, Ser: 0.87 mg/dL (ref 0.44–1.00)
GFR calc Af Amer: >60 mL/min (ref >60)
GFR calc non Af Amer: >60 mL/min (ref >60)
Glucose, Bld: 136 mg/dL — ABNORMAL HIGH (ref 70–99)
Potassium: 2.5 mmol/L — CL (ref 3.5–5.1)
Sodium: 141 mmol/L (ref 135–145)

## 2018-08-01 MED ORDER — KCL IN DEXTROSE-NACL 20-5-0.45 MEQ/L-%-% IV SOLN
INTRAVENOUS | Status: DC
  Administered 2018-08-01 – 2018-08-02 (×3): via INTRAVENOUS
  Filled 2018-08-01 (×3): qty 1000

## 2018-08-01 MED ORDER — POTASSIUM CHLORIDE CRYS ER 20 MEQ PO TBCR
40.0000 meq | EXTENDED_RELEASE_TABLET | ORAL | Status: DC
  Filled 2018-08-01 (×2): qty 2

## 2018-08-01 MED ORDER — POTASSIUM CHLORIDE 10 MEQ/100ML IV SOLN
10.0000 meq | INTRAVENOUS | Status: AC
  Administered 2018-08-01 (×4): 10 meq via INTRAVENOUS
  Filled 2018-08-01 (×4): qty 100

## 2018-08-01 NOTE — Progress Notes (Signed)
PT Cancellation Note  Patient Details Name: Amy Casey MRN: [id number] DOB: 11-Nov-1960   Cancelled Treatment:    Reason Eval/Treat Not Completed: Patient declined, no reason specified Pt refusing despite max encouragement. Will follow up as schedule allows.   Gladys Damme, PT, DPT  Acute Rehabilitation Services  Pager: [phone] Office: [phone]    [name] 08/01/2018, 3:24 PM

## 2018-08-01 NOTE — Progress Notes (Signed)
PROGRESS NOTE    Amy Casey  ZHY:[id number] DOB: 1960/10/09 DOA: 07/30/2018 PCP: Amy Canavan, PA-C      Brief Narrative:  Amy Casey is a 58 y.o. F with Bipolar disorder, HTN, who presents with altered mental status.  Per her husband she had 3 days of altered mentation, poor appetite, she quit taking her psychiatric medications.  Increased urinary frequency.  Her initial physical examination blood pressure was 151/61, pulse rate 72, temperature 98.5, respiratory 27, oxygen saturation 96%.  Her lungs were clear to auscultation bilaterally, heart S1-S2 present and rhythmic, abdomen soft, no lower extremity edema.  Urinalysis had 6-10 white cells, 0-5 red cells.  Chest x-ray negative for infiltrates, CT head negative for acute changes.   Patient was admitted to the hospital with a working diagnosis of acute toxic/ metabolic encephalopathy.    Assessment & Plan:  Acute metabolic or toxic encephalopathy Possibly from UTI -Continue ceftriaxone -Repeat urine culture -Hold lorazepam and all sedating medications   AKI -Continue IV fluids  Hypokalemia -Replete K IV  Paranoid schizophrenia Community dwelling. -Continue fluoxetine, trazodone -Hold lorazepam  Hypertension -Continue amlodipine       MDM and disposition: The below labs and imaging reports were reviewed and summarized above.  Medication management as above.  The patient was admitted with AKI, hypokalemia and dehydration in setting of AMS.  Unclear if she has had any improvement to this point, she is still barely able to engage in conversation.  Will atempt PT eval, continue IV fluids and antibiotics and re-evaluate tomorrow          DVT prophylaxis: Lovenox Code Status: FULL Family Communication: None    Consultants:   None  Procedures:   None  Antimicrobials:   Ceftriaxone    Subjective: No headache, chest pain.  Abdominal pain, but cannot elaborate.  Denies depression.     Objective: Vitals:   08/01/18 0611 08/01/18 0612 08/01/18 0614 08/01/18 1331  BP: (!) 169/99 (!) 169/99 (!) 169/99 (!) 181/94  Pulse: 70 70 70 69  Resp:    16  Temp: 98.6 F (37 C) 98.6 F (37 C) 98.6 F (37 C) 97.9 F (36.6 C)  TempSrc: Oral Oral Axillary Axillary  SpO2: 99% 99% 99% 100%  Weight:        Intake/Output Summary (Last 24 hours) at 08/01/2018 1613 Last data filed at 08/01/2018 1500 Gross per 24 hour  Intake 1466.9 ml  Output 400 ml  Net 1066.9 ml   Filed Weights   07/30/18 0925  Weight: 77.1 kg    Examination: General appearance: thin  adult female, awake, but gaurded, no acute distress.   HEENT: Anicteric, conjunctiva pink, lids and lashes normal. No nasal deformity, discharge, epistaxis.  Lips dry, dentition poor, no oral lesions, OP very dry, hearing seems normal.   Skin: Warm and dry.  no jaundice.  No suspicious rashes or lesions. Cardiac: RRR, nl S1-S2, no murmurs appreciated.  Capillary refill is brisk.  JVP not visible.  No LE edema.  Radial pulses 2+ and symmetric. Respiratory: Normal respiratory rate and rhythm.  CTAB without rales or wheezes. Abdomen: Abdomen soft.  No TTP. No ascites, distension, hepatosplenomegaly.   MSK: No deformities or effusions.Diffuse loss of subcutaneous muscle mass and fat. Neuro: Awake and alert.  EOMI, moves all extremities. Speech soft and mumnbling but seems fluent    Psych: Sensorium intact, oriented to "[facility]", month.  Very gaurded. Psychomotor slowing noted.   Affect very limited.  Judgment and insight  appear impaired.    Data Reviewed: I have personally reviewed following labs and imaging studies:  CBC: Recent Labs  Lab 07/30/18 0931 07/31/18 0637  WBC 8.0 7.8  HGB 14.3 14.8  HCT 45.3 46.1*  MCV 90.8 89.5  PLT 408* 395   Basic Metabolic Panel: Recent Labs  Lab 07/30/18 0931 07/30/18 1111 07/31/18 0637 08/01/18 0246  NA 145  --  142 141  K 2.5*  --  3.1* 2.5*  CL 99  --  101 100  CO2 32   --  27 29  GLUCOSE 110*  --  135* 136*  BUN 44*  --  25* 14  CREATININE 1.58*  --  0.98 0.87  CALCIUM 9.6  --  9.3 8.7*  MG  --  2.3  --   --    GFR: CrCl cannot be calculated (Unknown ideal weight.). Liver Function Tests: Recent Labs  Lab 07/30/18 0931  AST 31  ALT 29  ALKPHOS 69  BILITOT 1.0  PROT 8.0  ALBUMIN 4.4   No results for input(s): LIPASE, AMYLASE in the last 168 hours. Recent Labs  Lab 07/30/18 1111  AMMONIA 26   Coagulation Profile: No results for input(s): INR, PROTIME in the last 168 hours. Cardiac Enzymes: No results for input(s): CKTOTAL, CKMB, CKMBINDEX, TROPONINI in the last 168 hours. BNP (last 3 results) No results for input(s): PROBNP in the last 8760 hours. HbA1C: No results for input(s): HGBA1C in the last 72 hours. CBG: Recent Labs  Lab 07/30/18 1038  GLUCAP 82   Lipid Profile: No results for input(s): CHOL, HDL, LDLCALC, TRIG, CHOLHDL, LDLDIRECT in the last 72 hours. Thyroid Function Tests: No results for input(s): TSH, T4TOTAL, FREET4, T3FREE, THYROIDAB in the last 72 hours. Anemia Panel: No results for input(s): VITAMINB12, FOLATE, FERRITIN, TIBC, IRON, RETICCTPCT in the last 72 hours. Urine analysis:    Component Value Date/Time   COLORURINE AMBER (A) 07/30/2018 1117   APPEARANCEUR HAZY (A) 07/30/2018 1117   LABSPEC 1.019 07/30/2018 1117   PHURINE 5.0 07/30/2018 1117   GLUCOSEU NEGATIVE 07/30/2018 1117   HGBUR NEGATIVE 07/30/2018 1117   BILIRUBINUR NEGATIVE 07/30/2018 1117   BILIRUBINUR n 06/02/2016 1251   KETONESUR 5 (A) 07/30/2018 1117   PROTEINUR 30 (A) 07/30/2018 1117   UROBILINOGEN negative 06/02/2016 1251   UROBILINOGEN 1.0 02/19/2014 1849   NITRITE NEGATIVE 07/30/2018 1117   LEUKOCYTESUR LARGE (A) 07/30/2018 1117   Sepsis Labs: @LABRCNTIP (procalcitonin:4,lacticacidven:4)  ) Recent Results (from the past 240 hour(s))  SARS Coronavirus 2 [facility](Hospital order, [facility])     Status: None    Collection Time: 07/30/18 11:11 AM  Result Value Ref Range Status   SARS Coronavirus 2 NEGATIVE NEGATIVE Final    Comment: (NOTE) If result is NEGATIVE SARS-CoV-2 target nucleic acids are NOT DETECTED. The SARS-CoV-2 RNA is generally detectable in upper and lower  respiratory specimens during the acute phase of infection. The lowest  concentration of SARS-CoV-2 viral copies this assay can detect is 250  copies / mL. A negative result does not preclude SARS-CoV-2 infection  and should not be used as the sole basis for treatment or other  patient management decisions.  A negative result may occur with  improper specimen collection / handling, submission of specimen other  than nasopharyngeal swab, presence of viral mutation(s) within the  areas targeted by this assay, and inadequate number of viral copies  (<250 copies / mL). A negative result must be combined with clinical  observations, patient history, and epidemiological information. If result is POSITIVE SARS-CoV-2 target nucleic acids are DETECTED. The SARS-CoV-2 RNA is generally detectable in upper and lower  respiratory specimens dur ing the acute phase of infection.  Positive  results are indicative of active infection with SARS-CoV-2.  Clinical  correlation with patient history and other diagnostic information is  necessary to determine patient infection status.  Positive results do  not rule out bacterial infection or co-infection with other viruses. If result is PRESUMPTIVE POSTIVE SARS-CoV-2 nucleic acids MAY BE PRESENT.   A presumptive positive result was obtained on the submitted specimen  and confirmed on repeat testing.  While 2019 novel coronavirus  (SARS-CoV-2) nucleic acids may be present in the submitted sample  additional confirmatory testing may be necessary for epidemiological  and / or clinical management purposes  to differentiate between  SARS-CoV-2 and other Sarbecovirus currently known to infect humans.   If clinically indicated additional testing with an alternate test  methodology [phone]) is advised. The SARS-CoV-2 RNA is generally  detectable in upper and lower respiratory sp ecimens during the acute  phase of infection. The expected result is Negative. Fact Sheet for Patients:  BoilerBrush.com.cy Fact Sheet for Healthcare Providers: https://pope.com/ This test is not yet approved or cleared by the [place] FDA and has been authorized for detection and/or diagnosis of SARS-CoV-2 by FDA under an Emergency Use Authorization (EUA).  This EUA will remain in effect (meaning this test can be used) for the duration of the COVID-19 declaration under Section 564(b)(1) of the Act, 21 U.S.C. section 360bbb-3(b)(1), unless the authorization is terminated or revoked sooner. Performed at [facility], [address], [place], [place] [address]   Urine Culture     Status: Abnormal   Collection Time: 07/30/18 11:17 AM  Result Value Ref Range Status   Specimen Description URINE, RANDOM  Final   Special Requests   Final    NONE Performed at [facility], [address] [place], [place] [address]    Culture MULTIPLE SPECIES PRESENT, SUGGEST RECOLLECTION (A)  Final   Report Status 07/31/2018 FINAL  Final  MRSA PCR Screening     Status: None   Collection Time: 07/30/18  6:12 PM  Result Value Ref Range Status   MRSA by PCR NEGATIVE NEGATIVE Final    Comment:        The GeneXpert MRSA Assay (FDA approved for NASAL specimens only), is one component of a comprehensive MRSA colonization surveillance program. It is not intended to diagnose MRSA infection nor to guide or monitor treatment for MRSA infections. Performed at [facility], [address] [place], [place] [address]          Radiology Studies: No results found.      Scheduled Meds: . amLODipine  5 mg Oral Daily  . enoxaparin (LOVENOX) injection  40  mg Subcutaneous Q24H  . FLUoxetine  20 mg Oral Daily  . sodium chloride flush  3 mL Intravenous Q12H   Continuous Infusions: . sodium chloride    . cefTRIAXone (ROCEPHIN)  IV 1 g (08/01/18 1200)  . dextrose 5 % and 0.45 % NaCl with KCl 20 mEq/L 125 mL/hr at 08/01/18 1451  . potassium chloride 10 mEq (08/01/18 1445)     LOS: 1 day    Time spent: 25 minutes    Alberteen Sam, MD [facility] 08/01/2018, 4:13 PM     Please page through AMION:  [email or web address] Password [email or web address]  If 7PM-7AM, please contact night-coverage

## 2018-08-02 LAB — TSH: TSH: 3.12 u[IU]/mL (ref 0.350–4.500)

## 2018-08-02 LAB — CBC
HCT: 40.5 % (ref 36.0–46.0)
Hemoglobin: 13.3 g/dL (ref 12.0–15.0)
MCH: 29 pg (ref 26.0–34.0)
MCHC: 32.8 g/dL (ref 30.0–36.0)
MCV: 88.2 fL (ref 80.0–100.0)
Platelets: 332 10*3/uL (ref 150–400)
RBC: 4.59 MIL/uL (ref 3.87–5.11)
RDW: 12.3 % (ref 11.5–15.5)
WBC: 5.3 10*3/uL (ref 4.0–10.5)
nRBC: 0 % (ref 0.0–0.2)

## 2018-08-02 LAB — URINE CULTURE: Culture: NO GROWTH

## 2018-08-02 LAB — BASIC METABOLIC PANEL
Anion gap: 9 (ref 5–15)
BUN: <5 mg/dL — ABNORMAL LOW (ref 6–20)
CO2: 27 mmol/L (ref 22–32)
Calcium: 8.7 mg/dL — ABNORMAL LOW (ref 8.9–10.3)
Chloride: 105 mmol/L (ref 98–111)
Creatinine, Ser: 0.8 mg/dL (ref 0.44–1.00)
GFR calc Af Amer: >60 mL/min (ref >60)
GFR calc non Af Amer: >60 mL/min (ref >60)
Glucose, Bld: 126 mg/dL — ABNORMAL HIGH (ref 70–99)
Potassium: 2.7 mmol/L — CL (ref 3.5–5.1)
Sodium: 141 mmol/L (ref 135–145)

## 2018-08-02 MED ORDER — KCL IN DEXTROSE-NACL 40-5-0.9 MEQ/L-%-% IV SOLN
INTRAVENOUS | Status: DC
  Administered 2018-08-02 (×2): via INTRAVENOUS
  Filled 2018-08-02 (×3): qty 1000

## 2018-08-02 MED ORDER — SODIUM CHLORIDE 0.9% FLUSH
10.0000 mL | INTRAVENOUS | Status: DC | PRN
  Administered 2018-08-03: 10 mL
  Filled 2018-08-02: qty 40

## 2018-08-02 MED ORDER — POTASSIUM CHLORIDE 10 MEQ/100ML IV SOLN
10.0000 meq | INTRAVENOUS | Status: AC
  Administered 2018-08-02 (×4): 10 meq via INTRAVENOUS
  Filled 2018-08-02 (×4): qty 100

## 2018-08-02 MED ORDER — POTASSIUM CHLORIDE 10 MEQ/100ML IV SOLN: 10.0000 meq | INTRAVENOUS | Status: DC

## 2018-08-02 MED ORDER — SODIUM CHLORIDE 0.9% FLUSH
10.0000 mL | Freq: Two times a day (BID) | INTRAVENOUS | Status: DC
  Administered 2018-08-02 – 2018-08-07 (×8): 10 mL

## 2018-08-02 NOTE — Progress Notes (Signed)
PT Cancellation Note  Patient Details Name: Krystian Peaden MRN: [id number] DOB: 1960-11-25   Cancelled Treatment:    Reason Eval/Treat Not Completed: Patient declined, no reason specified Pt continues to refuse therapy despite max encouragement. Will reattempt one more time at next available date and if pt continues to refuse, will sign off from acute PT standpoint.   Gladys Damme, PT, DPT  Acute Rehabilitation Services  Pager: [phone] Office: [phone]    [name] 08/02/2018, 2:57 PM

## 2018-08-02 NOTE — Progress Notes (Signed)
PROGRESS NOTE    Amy Casey  ZOX:[id number] DOB: 01/23/1961 DOA: 07/30/2018 PCP: Jac Canavanysinger, David S, PA-C      Brief Narrative:  [name] is a 58 y.o. F with Bipolar disorder, HTN, who presents with altered mental status.  Per her husband she had 3 days of altered mentation, poor appetite, she quit taking her psychiatric medications.  Increased urinary frequency.  Her initial physical examination blood pressure was 151/61, pulse rate 72, temperature 98.5, respiratory 27, oxygen saturation 96%.  Her lungs were clear to auscultation bilaterally, heart S1-S2 present and rhythmic, abdomen soft, no lower extremity edema.  Urinalysis had 6-10 white cells, 0-5 red cells.  Chest x-ray negative for infiltrates, CT head negative for acute changes.   Patient was admitted to the hospital with a working diagnosis of acute toxic/ metabolic encephalopathy.    Assessment & Plan:  Acute metabolic or toxic encephalopathy Possibly from UTI, compounded by hypokalemia.  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  Repeat culture NGTD -Continue CTX -Aggressively replete fluids and K -Hold lorazepam and all sedating medications   AKI Resolved  Hypokalemia Mag normal. -Will administer more IV K  Paranoid schizophrenia Community dwelling. -Continue fluoxetine, trazodone -Hold lorazepam  Hypertension BP normal -Continue amlodipine  SARS-CoV-2 testing was obtained for screening purposes.  COVID ruled out.      MDM and disposition: The below labs and imaging reports reviewed and summarized above.  Medication management as above.  The patient was admitted with acute kidney injury, hyperkalemia, dehydration in the setting of acute altered mental status, suspect an acute metabolic encephalopathy.  She does not seem to have had any improvement in her mentation for now, and is beginning to appear to have catatonia rather than an organic encephalopathy.  We will finish  ruling out organic causes of metabolic encephalopathy, and if no improvement with normal potassium tomorrow, we will consult psychiatry.         DVT prophylaxis: Lovenox Code Status: FULL Family Communication: None    Consultants:   None  Procedures:   None  Antimicrobials:   Ceftriaxone    Subjective: She does not engage in conversation today.  Per nursing, no fever, no respiratory distress, no diarrhea, no vomiting, no urinary incontinence or changes in urination, no cough.  Objective: Vitals:   08/01/18 1714 08/01/18 2224 08/02/18 0507 08/02/18 1345  BP: (!) 161/91 (!) 159/90 (!) 160/91 (!) 173/90  Pulse: 72 70 87 81  Resp:  18 16 16   Temp:  98.9 F (37.2 C) 98.6 F (37 C) 98.9 F (37.2 C)  TempSrc:  Oral Oral Oral  SpO2:  99% 100% 100%  Weight:        Intake/Output Summary (Last 24 hours) at 08/02/2018 1414 Last data filed at 08/02/2018 0313 Gross per 24 hour  Intake 2009.6 ml  Output 1200 ml  Net 809.6 ml   Filed Weights   07/30/18 0925  Weight: 77.1 kg    Examination: General appearance: Thin adult female, lying in bed, makes eye contact but does not engage. HEENT: Anicteric, conjunctival pink, lids and lashes normal.  No nasal deformity, discharge, or epistaxis.  Lips dry, dentition poor, no oral lesions, oropharynx tacky dry, hearing seems normal.   Skin: No suspicious rashes or lesions Cardiac: Regular rate and rhythm, no murmurs, JVP not visible, no lower extremity edema. Respiratory: Normal respiratory rate and rhythm, lungs clear without rales or wheezes. Abdomen: Abdomen soft without tenderness to palpation, grimace, ascites, distention, or hepatosplenomegaly.  MSK: No deformities or effusions of the large joints of the upper lower extremities bilaterally, diffuse loss subcutaneous muscle mass and fat. Neuro: Awake and alert, movements of the eyes appears normal, she does not follow commands in general, strength cannot be tested.    Psych:  Very guarded, psychomotor slowing noted.  Affect constrained.    Data Reviewed: I have personally reviewed following labs and imaging studies:  CBC: Recent Labs  Lab 07/30/18 0931 07/31/18 0637 08/02/18 0328  WBC 8.0 7.8 5.3  HGB 14.3 14.8 13.3  HCT 45.3 46.1* 40.5  MCV 90.8 89.5 88.2  PLT 408* 395 332   Basic Metabolic Panel: Recent Labs  Lab 07/30/18 0931 07/30/18 1111 07/31/18 0637 08/01/18 0246 08/02/18 0328  NA 145  --  142 141 141  K 2.5*  --  3.1* 2.5* 2.7*  CL 99  --  101 100 105  CO2 32  --  GLUCOSE 110*  --  135* 136* 126*  BUN 44*  --  25* 14 <5*  CREATININE 1.58*  --  0.98 0.87 0.80  CALCIUM 9.6  --  9.3 8.7* 8.7*  MG  --  2.3  --   --   --    GFR: CrCl cannot be calculated (Unknown ideal weight.). Liver Function Tests: Recent Labs  Lab 07/30/18 0931  AST 31  ALT 29  ALKPHOS 69  BILITOT 1.0  PROT 8.0  ALBUMIN 4.4   No results for input(s): LIPASE, AMYLASE in the last 168 hours. Recent Labs  Lab 07/30/18 1111  AMMONIA 26   Coagulation Profile: No results for input(s): INR, PROTIME in the last 168 hours. Cardiac Enzymes: No results for input(s): CKTOTAL, CKMB, CKMBINDEX, TROPONINI in the last 168 hours. BNP (last 3 results) No results for input(s): PROBNP in the last 8760 hours. HbA1C: No results for input(s): HGBA1C in the last 72 hours. CBG: Recent Labs  Lab 07/30/18 1038  GLUCAP 82   Lipid Profile: No results for input(s): CHOL, HDL, LDLCALC, TRIG, CHOLHDL, LDLDIRECT in the last 72 hours. Thyroid Function Tests: Recent Labs    08/02/18 0328  TSH 3.120   Anemia Panel: No results for input(s): VITAMINB12, FOLATE, FERRITIN, TIBC, IRON, RETICCTPCT in the last 72 hours. Urine analysis:    Component Value Date/Time   COLORURINE AMBER (A) 07/30/2018 1117   APPEARANCEUR HAZY (A) 07/30/2018 1117   LABSPEC 1.019 07/30/2018 1117   PHURINE 5.0 07/30/2018 1117   GLUCOSEU NEGATIVE 07/30/2018 1117   HGBUR NEGATIVE  07/30/2018 1117   BILIRUBINUR NEGATIVE 07/30/2018 1117   BILIRUBINUR n 06/02/2016 1251   KETONESUR 5 (A) 07/30/2018 1117   PROTEINUR 30 (A) 07/30/2018 1117   UROBILINOGEN negative 06/02/2016 1251   UROBILINOGEN 1.0 02/19/2014 1849   NITRITE NEGATIVE 07/30/2018 1117   LEUKOCYTESUR LARGE (A) 07/30/2018 1117   Sepsis Labs: (procalcitonin:4,lacticacidven:4)  ) Recent Results (from the past 240 hour(s))  SARS Coronavirus 2 [facility] order, Performed in [facility] hospital lab)     Status: None   Collection Time: 07/30/18 11:11 AM  Result Value Ref Range Status   SARS Coronavirus 2 NEGATIVE NEGATIVE Final    Comment: (NOTE) If result is NEGATIVE SARS-CoV-2 target nucleic acids are NOT DETECTED. The SARS-CoV-2 RNA is generally detectable in upper and lower  respiratory specimens during the acute phase of infection. The lowest  concentration of SARS-CoV-2 viral copies this assay can detect is 250  copies / mL. A negative result does not preclude SARS-CoV-2 infection  and should not be used as the sole basis for treatment or other  patient management decisions.  A negative result may occur with  improper specimen collection / handling, submission of specimen other  than nasopharyngeal swab, presence of viral mutation(s) within the  areas targeted by this assay, and inadequate number of viral copies  (<250 copies / mL). A negative result must be combined with clinical  observations, patient history, and epidemiological information. If result is POSITIVE SARS-CoV-2 target nucleic acids are DETECTED. The SARS-CoV-2 RNA is generally detectable in upper and lower  respiratory specimens dur ing the acute phase of infection.  Positive  results are indicative of active infection with SARS-CoV-2.  Clinical  correlation with patient history and other diagnostic information is  necessary to determine patient infection status.  Positive results do  not rule out bacterial infection or  co-infection with other viruses. If result is PRESUMPTIVE POSTIVE SARS-CoV-2 nucleic acids MAY BE PRESENT.   A presumptive positive result was obtained on the submitted specimen  and confirmed on repeat testing.  While 2019 novel coronavirus  (SARS-CoV-2) nucleic acids may be present in the submitted sample  additional confirmatory testing may be necessary for epidemiological  and / or clinical management purposes  to differentiate between  SARS-CoV-2 and other Sarbecovirus currently known to infect humans.  If clinically indicated additional testing with an alternate test  methodology [phone]) is advised. The SARS-CoV-2 RNA is generally  detectable in upper and lower respiratory sp ecimens during the acute  phase of infection. The expected result is Negative. Fact Sheet for Patients:  BoilerBrush.com.cy Fact Sheet for Healthcare Providers: https://pope.com/ This test is not yet approved or cleared by the [place] FDA and has been authorized for detection and/or diagnosis of SARS-CoV-2 by FDA under an Emergency Use Authorization (EUA).  This EUA will remain in effect (meaning this test can be used) for the duration of the COVID-19 declaration under Section 564(b)(1) of the Act, 21 U.S.C. section 360bbb-3(b)(1), unless the authorization is terminated or revoked sooner. Performed at [facility], [address] [place], [place] [address]   Urine Culture     Status: Abnormal   Collection Time: 07/30/18 11:17 AM  Result Value Ref Range Status   Specimen Description URINE, RANDOM  Final   Special Requests   Final    NONE Performed at [facility], [address], [place], [place] [address]    Culture MULTIPLE SPECIES PRESENT, SUGGEST RECOLLECTION (A)  Final   Report Status 07/31/2018 FINAL  Final  MRSA PCR Screening     Status: None   Collection Time: 07/30/18  6:12 PM  Result Value Ref Range Status   MRSA by PCR  NEGATIVE NEGATIVE Final    Comment:        The GeneXpert MRSA Assay (FDA approved for NASAL specimens only), is one component of a comprehensive MRSA colonization surveillance program. It is not intended to diagnose MRSA infection nor to guide or monitor treatment for MRSA infections. Performed at [facility], [address] [place], [place] [address]   Culture, Urine     Status: None   Collection Time: 08/01/18  3:00 PM  Result Value Ref Range Status   Specimen Description URINE, RANDOM  Final   Special Requests NONE  Final   Culture   Final    NO GROWTH Performed at [facility], [address], [place], [place] [address]    Report Status 08/02/2018  FINAL  Final         Radiology Studies: No results found.      Scheduled Meds: . amLODipine  5 mg Oral Daily  . enoxaparin (LOVENOX) injection  40 mg Subcutaneous Q24H  . FLUoxetine  20 mg Oral Daily  . sodium chloride flush  10-40 mL Intracatheter Q12H  . sodium chloride flush  3 mL Intravenous Q12H   Continuous Infusions: . sodium chloride    . cefTRIAXone (ROCEPHIN)  IV 1 g (08/02/18 1300)  . dextrose 5 % and 0.9 % NaCl with KCl 40 mEq/L 100 mL/hr at 08/02/18 0954     LOS: 2 days    Time spent: 25 minutes    Alberteen Sam, MD [facility] 08/02/2018, 2:14 PM     Please page through AMION:  [email or web address] Password [email or web address] If 7PM-7AM, please contact night-coverage

## 2018-08-03 DIAGNOSIS — F200 Paranoid schizophrenia: Secondary | ICD-10-CM

## 2018-08-03 LAB — CBC
HCT: 43.7 % (ref 36.0–46.0)
Hemoglobin: 14.2 g/dL (ref 12.0–15.0)
MCH: 29 pg (ref 26.0–34.0)
MCHC: 32.5 g/dL (ref 30.0–36.0)
MCV: 89.2 fL (ref 80.0–100.0)
Platelets: 326 10*3/uL (ref 150–400)
RBC: 4.9 MIL/uL (ref 3.87–5.11)
RDW: 12.4 % (ref 11.5–15.5)
WBC: 5.8 10*3/uL (ref 4.0–10.5)
nRBC: 0 % (ref 0.0–0.2)

## 2018-08-03 LAB — BASIC METABOLIC PANEL
Anion gap: 12 (ref 5–15)
BUN: <5 mg/dL — ABNORMAL LOW (ref 6–20)
CO2: 26 mmol/L (ref 22–32)
Calcium: 9.2 mg/dL (ref 8.9–10.3)
Chloride: 104 mmol/L (ref 98–111)
Creatinine, Ser: 0.72 mg/dL (ref 0.44–1.00)
GFR calc Af Amer: >60 mL/min (ref >60)
GFR calc non Af Amer: >60 mL/min (ref >60)
Glucose, Bld: 126 mg/dL — ABNORMAL HIGH (ref 70–99)
Potassium: 4 mmol/L (ref 3.5–5.1)
Sodium: 142 mmol/L (ref 135–145)

## 2018-08-03 LAB — VITAMIN B12: Vitamin B-12: 444 pg/mL (ref 180–914)

## 2018-08-03 MED ORDER — LABETALOL HCL 5 MG/ML IV SOLN
10.0000 mg | Freq: Four times a day (QID) | INTRAVENOUS | Status: DC | PRN
  Administered 2018-08-08: 10 mg via INTRAVENOUS
  Filled 2018-08-03: qty 4

## 2018-08-03 MED ORDER — LACTATED RINGERS IV SOLN
INTRAVENOUS | Status: DC
  Administered 2018-08-03 (×2): via INTRAVENOUS

## 2018-08-03 MED ORDER — HALOPERIDOL LACTATE 5 MG/ML IJ SOLN
5.0000 mg | Freq: Every day | INTRAMUSCULAR | Status: DC
  Filled 2018-08-03: qty 1

## 2018-08-03 MED ORDER — OLANZAPINE 10 MG IM SOLR
5.0000 mg | Freq: Every day | INTRAMUSCULAR | Status: DC
  Filled 2018-08-03 (×2): qty 10

## 2018-08-03 MED ORDER — HALOPERIDOL LACTATE 5 MG/ML IJ SOLN: 5.0000 mg | Freq: Once | INTRAMUSCULAR | Status: DC

## 2018-08-03 MED ORDER — HALOPERIDOL LACTATE 5 MG/ML IJ SOLN
5.0000 mg | Freq: Every day | INTRAMUSCULAR | Status: DC
  Administered 2018-08-03 – 2018-08-04 (×2): 5 mg via INTRAVENOUS
  Filled 2018-08-03 (×2): qty 1

## 2018-08-03 NOTE — Progress Notes (Signed)
IVC received by magistrate. [employer] to serve patient. Paperwork on the shadow chart.   Amy Casey Amy Casey Amy Casey Ulatowski LCSW [phone]

## 2018-08-03 NOTE — Progress Notes (Signed)
Pt dinner tray ordered.

## 2018-08-03 NOTE — Progress Notes (Addendum)
PROGRESS NOTE    Amy QualeBeverly Casey  ZOX:[id number] DOB: 11/30/1960 DOA: 07/30/2018 PCP: Jac Canavanysinger, David S, PA-C      Brief Narrative:  [name] is a 58 y.o. F with Bipolar disorder, HTN, who presents with altered mental status.  Per her husband she had 3 days of altered mentation, poor appetite, she quit taking her psychiatric medications.  Increased urinary frequency.  Her initial physical examination blood pressure was 151/61, pulse rate 72, temperature 98.5, respiratory 27, oxygen saturation 96%.  Her lungs were clear to auscultation bilaterally, heart S1-S2 present and rhythmic, abdomen soft, no lower extremity edema.  Urinalysis had 6-10 white cells, 0-5 red cells.  Chest x-ray negative for infiltrates, CT head negative for acute changes.   Patient was admitted to the hospital with a working diagnosis of acute toxic/ metabolic encephalopathy.    Assessment & Plan:  Acute psychosis Initially this was thought to be from UTI, compounded by hypokalemia.  However these have both been now treated adequately and she is had no change in mentation.  I suspect that it is instead catatonia versus a psychotic episode.  -Stop ceftriaxone -Consult psychiatry -Start Haldol 5 mg IM daily   AKI Resolved  Hypokalemia Magnesium normal, potassium repleted.  Paranoid schizophrenia Previously on Zyprexa, evidently stopped medication.  Using all medications p.o. -IM Haldol 5 mg daily  Hypertension Blood pressure elevated.   -Continue IV antihypertensives for now, will start clonidine patch at time of discharge to Psych  SARS-CoV-2 testing was obtained for screening purposes.  COVID ruled out.      MDM and disposition: The below labs and imaging reports were reviewed and summarized above.  Medication management as above.  The patient was admitted with acute kidney injury, hyperkalemia, dehydration in the setting of change in her mentation.  She does not seem to have any  improvement in mentation after treatment of infection, hypokalemia, and dehydration.  Suspect that this is actually catatonia or psychosis, and psychiatry have evaluated the patient and agree.  She is now medically stable for discharge, we will begin IM Haldol, and hopefully she can be transitioned to a stable psychiatric setting in the next 24 to 48 hours.         DVT prophylaxis: Lovenox Code Status: FULL Family Communication: None    Consultants:   None  Procedures:   None  Antimicrobials:   Ceftriaxone    Subjective: No new fever, respiratory distress, diarrhea, vomiting, urinary incontinence, cough.  She refuses all cares.  Objective: Vitals:   08/02/18 1345 08/02/18 2138 08/03/18 0543 08/03/18 0927  BP: (!) 173/90 (!) 158/86 (!) 193/105 (!) 162/113  Pulse: 81 69 73 73  Resp: 16 17 19 18   Temp: 98.9 F (37.2 C) 98.8 F (37.1 C) 98.9 F (37.2 C)   TempSrc: Oral     SpO2: 100% 100% 100% 100%  Weight:        Intake/Output Summary (Last 24 hours) at 08/03/2018 1517 Last data filed at 08/03/2018 1111 Gross per 24 hour  Intake 1542.58 ml  Output -  Net 1542.58 ml   Filed Weights   07/30/18 0925  Weight: 77.1 kg    Examination: General appearance: Thin adult female, sitting up in a chair.  Does not make eye contact. HEENT: Anicteric, conjunctival pink, lids and lashes normal.  No nasal deformity, discharge, or epistaxis.  Lips dry, dentition poor, no oral lesions, oropharynx tacky dry, hearing seems normal.   Skin: No suspicious rashes or lesions Cardiac: Regular rate  and rhythm, no murmurs, no lower extremity edema. Respiratory: Respiratory rate and rhythm, lungs clear without rales or wheezes. Abdomen: Abdomen soft without tenderness to palpation, grimace, ascites, distention, or hepatosplenomegaly. MSK: No deformities or effusions of the large joints of the upper lower extremities bilaterally, diffuse loss subcutaneous muscle mass and fat. Neuro:  Awake and alert, makes eye contact, however does not respond to verbal stimuli, mumbles incoherently.  Strength is 5/5 in the upper and lower extremities bilaterally.  She is rigid/resists movement.    Psych: Guarded, mumbling unintelligibly.  Affect flat.      Data Reviewed: I have personally reviewed following labs and imaging studies:  CBC: Recent Labs  Lab 07/30/18 0931 07/31/18 0637 08/02/18 0328 08/03/18 0501  WBC 8.0 7.8 5.3 5.8  HGB 14.3 14.8 13.3 14.2  HCT 45.3 46.1* 40.5 43.7  MCV 90.8 89.5 88.2 89.2  PLT 408* 395 332 326   Basic Metabolic Panel: Recent Labs  Lab 07/30/18 0931 07/30/18 1111 07/31/18 0637 08/01/18 0246 08/02/18 0328 08/03/18 0501  NA 145  --  142 141 141 142  K 2.5*  --  3.1* 2.5* 2.7* 4.0  CL 99  --  101 100 105 104  CO2 32  --  GLUCOSE 110*  --  135* 136* 126* 126*  BUN 44*  --  25* 14 <5* <5*  CREATININE 1.58*  --  0.98 0.87 0.80 0.72  CALCIUM 9.6  --  9.3 8.7* 8.7* 9.2  MG  --  2.3  --   --   --   --    GFR: CrCl cannot be calculated (Unknown ideal weight.). Liver Function Tests: Recent Labs  Lab 07/30/18 0931  AST 31  ALT 29  ALKPHOS 69  BILITOT 1.0  PROT 8.0  ALBUMIN 4.4   No results for input(s): LIPASE, AMYLASE in the last 168 hours. Recent Labs  Lab 07/30/18 1111  AMMONIA 26   Coagulation Profile: No results for input(s): INR, PROTIME in the last 168 hours. Cardiac Enzymes: No results for input(s): CKTOTAL, CKMB, CKMBINDEX, TROPONINI in the last 168 hours. BNP (last 3 results) No results for input(s): PROBNP in the last 8760 hours. HbA1C: No results for input(s): HGBA1C in the last 72 hours. CBG: Recent Labs  Lab 07/30/18 1038  GLUCAP 82   Lipid Profile: No results for input(s): CHOL, HDL, LDLCALC, TRIG, CHOLHDL, LDLDIRECT in the last 72 hours. Thyroid Function Tests: Recent Labs    08/02/18 0328  TSH 3.120   Anemia Panel: Recent Labs    08/03/18 0844  VITAMINB12 444   Urine  analysis:    Component Value Date/Time   COLORURINE AMBER (A) 07/30/2018 1117   APPEARANCEUR HAZY (A) 07/30/2018 1117   LABSPEC 1.019 07/30/2018 1117   PHURINE 5.0 07/30/2018 1117   GLUCOSEU NEGATIVE 07/30/2018 1117   HGBUR NEGATIVE 07/30/2018 1117   BILIRUBINUR NEGATIVE 07/30/2018 1117   BILIRUBINUR n 06/02/2016 1251   KETONESUR 5 (A) 07/30/2018 1117   PROTEINUR 30 (A) 07/30/2018 1117   UROBILINOGEN negative 06/02/2016 1251   UROBILINOGEN 1.0 02/19/2014 1849   NITRITE NEGATIVE 07/30/2018 1117   LEUKOCYTESUR LARGE (A) 07/30/2018 1117   Sepsis Labs: (procalcitonin:4,lacticacidven:4)  ) Recent Results (from the past 240 hour(s))  SARS Coronavirus 2 [facility] order, Performed in [facility] hospital lab)     Status: None   Collection Time: 07/30/18 11:11 AM  Result Value Ref Range Status   SARS Coronavirus 2 NEGATIVE NEGATIVE Final  Comment: (NOTE) If result is NEGATIVE SARS-CoV-2 target nucleic acids are NOT DETECTED. The SARS-CoV-2 RNA is generally detectable in upper and lower  respiratory specimens during the acute phase of infection. The lowest  concentration of SARS-CoV-2 viral copies this assay can detect is 250  copies / mL. A negative result does not preclude SARS-CoV-2 infection  and should not be used as the sole basis for treatment or other  patient management decisions.  A negative result may occur with  improper specimen collection / handling, submission of specimen other  than nasopharyngeal swab, presence of viral mutation(s) within the  areas targeted by this assay, and inadequate number of viral copies  (<250 copies / mL). A negative result must be combined with clinical  observations, patient history, and epidemiological information. If result is POSITIVE SARS-CoV-2 target nucleic acids are DETECTED. The SARS-CoV-2 RNA is generally detectable in upper and lower  respiratory specimens dur ing the acute phase of infection.  Positive  results are  indicative of active infection with SARS-CoV-2.  Clinical  correlation with patient history and other diagnostic information is  necessary to determine patient infection status.  Positive results do  not rule out bacterial infection or co-infection with other viruses. If result is PRESUMPTIVE POSTIVE SARS-CoV-2 nucleic acids MAY BE PRESENT.   A presumptive positive result was obtained on the submitted specimen  and confirmed on repeat testing.  While 2019 novel coronavirus  (SARS-CoV-2) nucleic acids may be present in the submitted sample  additional confirmatory testing may be necessary for epidemiological  and / or clinical management purposes  to differentiate between  SARS-CoV-2 and other Sarbecovirus currently known to infect humans.  If clinically indicated additional testing with an alternate test  methodology [phone]) is advised. The SARS-CoV-2 RNA is generally  detectable in upper and lower respiratory sp ecimens during the acute  phase of infection. The expected result is Negative. Fact Sheet for Patients:  BoilerBrush.com.cy Fact Sheet for Healthcare Providers: https://pope.com/ This test is not yet approved or cleared by the [place] FDA and has been authorized for detection and/or diagnosis of SARS-CoV-2 by FDA under an Emergency Use Authorization (EUA).  This EUA will remain in effect (meaning this test can be used) for the duration of the COVID-19 declaration under Section 564(b)(1) of the Act, 21 U.S.C. section 360bbb-3(b)(1), unless the authorization is terminated or revoked sooner. Performed at [facility], [address], [place], [place] [address]   Urine Culture     Status: Abnormal   Collection Time: 07/30/18 11:17 AM  Result Value Ref Range Status   Specimen Description URINE, RANDOM  Final   Special Requests   Final    NONE Performed at [facility], [address] [place], [place]  [address]    Culture MULTIPLE SPECIES PRESENT, SUGGEST RECOLLECTION (A)  Final   Report Status 07/31/2018 FINAL  Final  MRSA PCR Screening     Status: None   Collection Time: 07/30/18  6:12 PM  Result Value Ref Range Status   MRSA by PCR NEGATIVE NEGATIVE Final    Comment:        The GeneXpert MRSA Assay (FDA approved for NASAL specimens only), is one component of a comprehensive MRSA colonization surveillance program. It is not intended to diagnose MRSA infection nor to guide or monitor treatment for MRSA infections. Performed at [facility], [address] [place], [place] [address]   Culture, Urine     Status: None  Collection Time: 08/01/18  3:00 PM  Result Value Ref Range Status   Specimen Description URINE, RANDOM  Final   Special Requests NONE  Final   Culture   Final    NO GROWTH Performed at [facility], [address], [place], [place] [address]    Report Status 08/02/2018 FINAL  Final         Radiology Studies: No results found.      Scheduled Meds: . amLODipine  5 mg Oral Daily  . enoxaparin (LOVENOX) injection  40 mg Subcutaneous Q24H  . FLUoxetine  20 mg Oral Daily  . haloperidol lactate  5 mg Intravenous Daily  . sodium chloride flush  10-40 mL Intracatheter Q12H  . sodium chloride flush  3 mL Intravenous Q12H   Continuous Infusions: . sodium chloride    . cefTRIAXone (ROCEPHIN)  IV 1 g (08/03/18 1106)  . lactated ringers 100 mL/hr at 08/03/18 0824     LOS: 3 days    Time spent: 25 minutes    Alberteen Sam, MD [facility] 08/03/2018, 3:17 PM     Please page through AMION:  [email or web address] Password [email or web address] If 7PM-7AM, please contact night-coverage

## 2018-08-03 NOTE — Progress Notes (Signed)
Pt sitting in chair whispering to self

## 2018-08-03 NOTE — Consult Note (Signed)
[facility] Face-to-Face Psychiatry Consult   Reason for Consult:  Catat[place] Referring Physician:  Dr. Joen Laura Patient Identification: Amy Casey MRN:  [id number] Principal Diagnosis: Paranoid schizophrenia (HCC) Diagnosis:  Principal Problem:   Acute metabolic encephalopathy Active Problems:   Paranoid schizophrenia (HCC)   Noncompliance   Acute lower UTI   Hypokalemia   Encephalopathy   Total Time spent with patient: 1 hour  Subjective:   Amy Casey is a 58 y.o. female patient admitted with altered mental status.  HPI:   Per chart review, patient was admitted with altered mental status. Her husband reports that she had onset of altered mental status 3 days ago in the setting of stopping her psychiatric medications. She has poor appetite and increased urinary frequency. She is receiving treatment for possible UTI. She is refusing PO medications including Prozac, Zyprexa, Norvasc and Amlodipine. 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 Home medications include Cogentin 0.5 mg daily, Zyprexa 10 mg qhs and Prozac 60 mg daily.   Of note, patient was last seen by the psychiatry service on 07/20/17 for depression in the setting of hypernatremia, dehydration and UTI. She exhibited paranoia and poor self care in the setting of depression. She was recommended for inpatient psychiatric hospitalization and sent to [facility].   Amy Casey is unable to participate in interview. She appears disorganized and possibly responding to internal stimuli. She whispers her responses and is incomprehensible. She reports, "I didn't say anything" when asked if she can speak up. Attempted to contact her husband for collateral but he was unable to be reached by home or cell phone. Her presentation appears similar to the last time she was seen by psychiatry and required inpatient psychiatric hospitalization for stabilization.   Past Psychiatric History: Schizophrenia and depression.   Risk  to Self:  UTA due to AMS.  Risk to Others:  UTA due to AMS.  Prior Inpatient Therapy:  She was last hospitalized at [facility] for depression with psychosis in 06/2017.  Prior Outpatient Therapy:   Prior medications include Risperdal 3 mg daily, Saphris 20 mg qhs and Invega 6 mg daily.   Past Medical History:  Past Medical History:  Diagnosis Date  . Depression    Dr. Mila Homer, [facility]; hospitalization 07/2010  . History of echocardiogram 2011   [facility]  . History of renal stone   . Hypertension   . Hypokalemia   . Obesity   . Schizophrenia (HCC)   . Wears glasses     Past Surgical History:  Procedure Laterality Date  . WISDOM TOOTH EXTRACTION     Family History:  Family History  Problem Relation Age of Onset  . Diabetes Mother   . Kidney disease Mother        dialysis  . Heart disease Mother   . Cancer Maternal Uncle        lung  . Stroke Neg Hx   . Hypertension Neg Hx   . Hyperlipidemia Neg Hx    Family Psychiatric  History: Mother had mental health problems.  Social History:  Social History   Substance and Sexual Activity  Alcohol Use No     Social History   Substance and Sexual Activity  Drug Use No    Social History   Socioeconomic History  . Marital status: Married    Spouse name: Not on file  . Number of children: Not on file  . Years of education: Not on file  . Highest education level: Not on file  Occupational History  . Not on file  Social Needs  . Financial resource strain: Not on file  . Food insecurity:    Worry: Not on file    Inability: Not on file  . Transportation needs:    Medical: Not on file    Non-medical: Not on file  Tobacco Use  . Smoking status: Never Smoker  . Smokeless tobacco: Never Used  Substance and Sexual Activity  . Alcohol use: No  . Drug use: No  . Sexual activity: Not Currently  Lifestyle  . Physical activity:    Days per week: Not on file    Minutes per session: Not on file  . Stress: Not on file   Relationships  . Social connections:    Talks on phone: Not on file    Gets together: Not on file    Attends religious service: Not on file    Active member of club or organization: Not on file    Attends meetings of clubs or organizations: Not on file    Relationship status: Not on file  Other Topics Concern  . Not on file  Social History Narrative   Married, 2 children, mother in law lives with them, was working as a Print production planner Social History: N/A    Allergies:   Allergies  Allergen Reactions  . Paliperidone Other (See Comments)    Angioedema, drooling, slurred speech, ptosis  . Sulfa Antibiotics Other (See Comments)    unknown  . Lisinopril Rash    Angioedema     Labs:  Results for orders placed or performed during the hospital encounter of 07/30/18 (from the past 48 hour(s))  Culture, Urine     Status: None   Collection Time: 08/01/18  3:00 PM  Result Value Ref Range   Specimen Description URINE, RANDOM    Special Requests NONE    Culture      NO GROWTH Performed at [facility], [address] [place], [place] [address]    Report Status 08/02/2018 FINAL   TSH     Status: None   Collection Time: 08/02/18  3:28 AM  Result Value Ref Range   TSH 3.120 0.350 - 4.500 uIU/mL    Comment: Performed by a 3rd Generation assay with a functional sensitivity of <=0.01 uIU/mL. Performed at [facility], [address], [place], [place] [address]   CBC     Status: None   Collection Time: 08/02/18  3:28 AM  Result Value Ref Range   WBC 5.3 4.0 - 10.5 K/uL   RBC 4.59 3.87 - 5.11 MIL/uL   Hemoglobin 13.3 12.0 - 15.0 g/dL   HCT 09.8 11.9 - 14.7 %   MCV 88.2 80.0 - 100.0 fL   MCH 29.0 26.0 - 34.0 pg   MCHC 32.8 30.0 - 36.0 g/dL   RDW 82.9 56.2 - 13.0 %   Platelets 332 150 - 400 K/uL   nRBC 0.0 0.0 - 0.2 %    Comment: Performed at [facility], [address], [place], [place] [address]  Basic metabolic panel     Status: Abnormal    Collection Time: 08/02/18  3:28 AM  Result Value Ref Range   Sodium 141 135 - 145 mmol/L   Potassium 2.7 (LL) 3.5 - 5.1 mmol/L    Comment: CRITICAL RESULT CALLED TO, READ BACK BY AND VERIFIED WITH: CUMMINGS C,RN 08/02/18 0513 WAYK    Chloride 105 98 - 111 mmol/L  CO2 27 22 - 32 mmol/L   Glucose, Bld 126 (H) 70 - 99 mg/dL   BUN <5 (L) 6 - 20 mg/dL   Creatinine, Ser 1.610.80 0.44 - 1.00 mg/dL   Calcium 8.7 (L) 8.9 - 10.3 mg/dL   GFR calc non Af Amer >60 >60 mL/min   GFR calc Af Amer >60 >60 mL/min   Anion gap 9 5 - 15    Comment: Performed at [facility], [address] [place], [place] [address]  CBC     Status: None   Collection Time: 08/03/18  5:01 AM  Result Value Ref Range   WBC 5.8 4.0 - 10.5 K/uL   RBC 4.90 3.87 - 5.11 MIL/uL   Hemoglobin 14.2 12.0 - 15.0 g/dL   HCT 54.043.7 98.136.0 - 19.146.0 %   MCV 89.2 80.0 - 100.0 fL   MCH 29.0 26.0 - 34.0 pg   MCHC 32.5 30.0 - 36.0 g/dL   RDW 47.812.4 29.511.5 - 62.115.5 %   Platelets 326 150 - 400 K/uL   nRBC 0.0 0.0 - 0.2 %    Comment: Performed at [facility], [address] [place], [place] [address]  Basic metabolic panel     Status: Abnormal   Collection Time: 08/03/18  5:01 AM  Result Value Ref Range   Sodium 142 135 - 145 mmol/L   Potassium 4.0 3.5 - 5.1 mmol/L   Chloride 104 98 - 111 mmol/L   CO2 26 22 - 32 mmol/L   Glucose, Bld 126 (H) 70 - 99 mg/dL   BUN <5 (L) 6 - 20 mg/dL   Creatinine, Ser 7.840.72 0.44 - 1.00 mg/dL   Calcium 9.2 8.9 - 69.610.3 mg/dL   GFR calc non Af Amer >60 >60 mL/min   GFR calc Af Amer >60 >60 mL/min   Anion gap 12 5 - 15    Comment: Performed at [facility], [address] [place], [place] [address]  Vitamin B12     Status: None   Collection Time: 08/03/18  8:44 AM  Result Value Ref Range   Vitamin B-12 444 180 - 914 pg/mL    Comment: (NOTE) This assay is not validated for testing neonatal or myeloproliferative syndrome specimens for Vitamin B12 levels. Performed at [facility], [address] [place], [place] [address]     Current Facility-Administered Medications  Medication Dose Route Frequency Provider Last Rate Last Dose  . 0.9 %  sodium chloride infusion  250 mL Intravenous PRN Emokpae, Courage, MD      . acetaminophen (TYLENOL) tablet 650 mg  650 mg Oral Q6H PRN Emokpae, Courage, MD       Or  . acetaminophen (TYLENOL) suppository 650 mg  650 mg Rectal Q6H PRN Emokpae, Courage, MD      . albuterol (PROVENTIL) (2.5 MG/3ML) 0.083% nebulizer solution 2.5 mg  2.5 mg Nebulization Q2H PRN Emokpae, Courage, MD      . amLODipine (NORVASC) tablet 5 mg  5 mg Oral Daily Emokpae, Courage, MD   5 mg at 07/30/18 1841  . cefTRIAXone (ROCEPHIN) 1 g in sodium chloride 0.9 % 100 mL IVPB  1 g Intravenous Q24H Emokpae, Courage, MD 200 mL/hr at 08/02/18 1300 1 g at 08/02/18 1300  . enoxaparin (LOVENOX) injection 40 mg  40 mg Subcutaneous Q24H Arrien, York RamMauricio Daniel, MD      . FLUoxetine (PROZAC) capsule 20 mg  20 mg Oral Daily Shon HaleEmokpae, Courage, MD      .  hydrALAZINE (APRESOLINE) injection 10 mg  10 mg Intravenous Q6H PRN Shon Hale, MD   10 mg at 07/31/18 0643  . labetalol (NORMODYNE) injection 10 mg  10 mg Intravenous Q6H PRN Danford, Earl Lites, MD      . lactated ringers infusion   Intravenous Continuous Alberteen Sam, MD 100 mL/hr at 08/03/18 0824    . ondansetron (ZOFRAN) tablet 4 mg  4 mg Oral Q6H PRN Emokpae, Courage, MD       Or  . ondansetron (ZOFRAN) injection 4 mg  4 mg Intravenous Q6H PRN Emokpae, Courage, MD      . polyethylene glycol (MIRALAX / GLYCOLAX) packet 17 g  17 g Oral Daily PRN Emokpae, Courage, MD      . sodium chloride flush (NS) 0.9 % injection 10-40 mL  10-40 mL Intracatheter Q12H Danford, Earl Lites, MD   10 mL at 08/02/18 2101  . sodium chloride flush (NS) 0.9 % injection 10-40 mL  10-40 mL Intracatheter PRN Danford, Earl Lites, MD      . sodium chloride flush (NS) 0.9 % injection 3 mL  3 mL Intravenous Q12H Emokpae, Courage, MD   3 mL  at 08/02/18 2102  . sodium chloride flush (NS) 0.9 % injection 3 mL  3 mL Intravenous PRN Emokpae, Courage, MD      . traZODone (DESYREL) tablet 50 mg  50 mg Oral QHS PRN Shon Hale, MD        Musculoskeletal: Strength & Muscle Tone: within normal limits Gait & Station: UTA due to patient sitting in a chair.  Patient leans: N/A  Psychiatric Specialty Exam: Physical Exam  Nursing note and vitals reviewed. Constitutional: She appears well-developed and well-nourished.  HENT:  Head: Normocephalic and atraumatic.  Neck: Normal range of motion.  Respiratory: Effort normal.  Musculoskeletal: Normal range of motion.  Neurological: She is alert.  Psychiatric: Her affect is blunt. Her speech is delayed. She is slowed. Thought content is paranoid. Cognition and memory are impaired. She expresses inappropriate judgment.    Review of Systems  Unable to perform ROS: Mental status change    Blood pressure (!) 162/113, pulse 73, temperature 98.9 F (37.2 C), resp. rate 18, weight 77.1 kg, SpO2 100 %.Body mass index is 24.04 kg/m.  General Appearance: Disheveled, middle aged, African American female, wearing a hospital gown with unbrushed hair and sitting with her legs crisscrossed in a chair. NAD.   Eye Contact:  Poor  Speech:  Slow and Slurred  Volume:  Decreased and speaking in a whisper.   Mood:  UTA due to AMS.  Affect:  Flat  Thought Process:  Disorganized  Orientation:  Other:  UTA due to AMS  Thought Content:  UTA due to AMS  Suicidal Thoughts:  UTA due to AMS  Homicidal Thoughts:  UTA due to AMS  Memory:  UTA due to AMS  Judgement:  Impaired  Insight:  Lacking  Psychomotor Activity:  Decreased  Concentration:  Concentration: Poor and Attention Span: Poor  Recall:  UTA due to AMS  Fund of Knowledge:  UTA due to AMS  Language:  UTA due to AMS  Akathisia:  NA  Handed:  Right  AIMS (if indicated):   N/A  Assets:  Housing Intimacy Physical Health Social Support  ADL's:   Impaired  Cognition:  Impaired due to AMS.  Sleep:   N/A   Assessment:  Amy Casey is a 58 y.o. female who was admitted with altered mental status in the setting  of poor medication compliance and possible UTI. Medical workout has been unremarkable. Patient appears to be responding to internal stimuli and speaks in a whisper. She appears paranoid. Recommend inpatient psychiatric hospitalization for stabilization and treatment.    Treatment Plan Summary: -Patient warrants inpatient psychiatric hospitalization given active psychosis and inability to care for self. -Start Haldol 5 mg daily for psychosis. Give IV if unable to tolerate PO. Can restart home medications once patient is willing to take PO medications.  -EKG reviewed and QTc 463 on 5/4. Please closely monitor when starting or increasing QTc prolonging agents.  -Patient may require IVC since she is unable to consent to hospitalization at this time due to altered mental status.  -Will sign off on patient at this time. Please consult psychiatry again as needed.     Disposition: Recommend psychiatric Inpatient admission when medically cleared.  Cherly Beach, DO 08/03/2018 10:53 AM

## 2018-08-03 NOTE — Progress Notes (Signed)
PT Cancellation Note  Patient Details Name: Amy Casey MRN: [id number] DOB: 06-25-1960   Cancelled Treatment:    Reason Eval/Treat Not Completed: Patient declined, no reason specified Attempted 3rd time to see pt and pt continues to refuse to work with therapy at this time. Will sign off. If needs change, and pt agreeable to therapies, please re-order.   Gladys Damme, PT, DPT  Acute Rehabilitation Services  Pager: [phone] Office: [phone]  [name] 08/03/2018, 10:29 AM

## 2018-08-03 NOTE — Progress Notes (Signed)
CSW received consult for inpatient psych placement. CSW sent referral to [facility] for review. Since patient is unable to sign consent (disoriented) she will need to be IVC'd.  Osborne Casco Joselynn Amoroso LCSW [phone]

## 2018-08-03 NOTE — Progress Notes (Signed)
Pt dinner tray delivered, pt states she does not want to eat at this time.

## 2018-08-04 LAB — BASIC METABOLIC PANEL
Anion gap: 12 (ref 5–15)
BUN: 8 mg/dL (ref 6–20)
CO2: 22 mmol/L (ref 22–32)
Calcium: 9.2 mg/dL (ref 8.9–10.3)
Chloride: 105 mmol/L (ref 98–111)
Creatinine, Ser: 0.94 mg/dL (ref 0.44–1.00)
GFR calc Af Amer: >60 mL/min (ref >60)
GFR calc non Af Amer: >60 mL/min (ref >60)
Glucose, Bld: 105 mg/dL — ABNORMAL HIGH (ref 70–99)
Potassium: 3.6 mmol/L (ref 3.5–5.1)
Sodium: 139 mmol/L (ref 135–145)

## 2018-08-04 LAB — CBC
HCT: 41.6 % (ref 36.0–46.0)
Hemoglobin: 13.6 g/dL (ref 12.0–15.0)
MCH: 28.8 pg (ref 26.0–34.0)
MCHC: 32.7 g/dL (ref 30.0–36.0)
MCV: 88.1 fL (ref 80.0–100.0)
Platelets: 264 10*3/uL (ref 150–400)
RBC: 4.72 MIL/uL (ref 3.87–5.11)
RDW: 12.3 % (ref 11.5–15.5)
WBC: 8.4 10*3/uL (ref 4.0–10.5)
nRBC: 0 % (ref 0.0–0.2)

## 2018-08-04 LAB — HIV ANTIBODY (ROUTINE TESTING W REFLEX): HIV Screen 4th Generation wRfx: NONREACTIVE

## 2018-08-04 LAB — RPR: RPR Ser Ql: NONREACTIVE

## 2018-08-04 MED ORDER — OLANZAPINE 5 MG PO TABS
5.0000 mg | ORAL_TABLET | Freq: Every day | ORAL | Status: DC
  Administered 2018-08-05 – 2018-08-06 (×2): 5 mg via ORAL
  Filled 2018-08-04 (×5): qty 1

## 2018-08-04 NOTE — Progress Notes (Signed)
CSW contacted [facility] for placement. [facility] reviewing referral and will contact CSW when bed is available. CSW acknowledged. Will continue to assist as needed.

## 2018-08-04 NOTE — Progress Notes (Signed)
VT of 141 at the beginning of the shift. Patient quickly back to NSR with no symptom.  Notified Bodenheimer without any new orders. Will continue to monitor.

## 2018-08-04 NOTE — Progress Notes (Signed)
PROGRESS NOTE    Amy Casey  ZOX:[id number] DOB: 1960/11/17 DOA: 07/30/2018 PCP: Jac Canavan, PA-C      Brief Narrative:  Amy Casey is a 58 y.o. F with Bipolar disorder, HTN, who presents with altered mental status.  Per her husband she had 3 days of altered mentation, poor appetite, she quit taking her psychiatric medications.  Increased urinary frequency.  Her initial physical examination blood pressure was 151/61, pulse rate 72, temperature 98.5, respiratory 27, oxygen saturation 96%.  Her lungs were clear to auscultation bilaterally, heart S1-S2 present and rhythmic, abdomen soft, no lower extremity edema.  Urinalysis had 6-10 white cells, 0-5 red cells.  Chest x-ray negative for infiltrates, CT head negative for acute changes.   Patient was admitted to the hospital with a working diagnosis of acute toxic/ metabolic encephalopathy.    Assessment & Plan:  Acute psychosis Paranoid schizophrenia Patient admitted and initially thought to be somnolent/altered due to UTI, compounded by dehydration and hypokalemia.  She was treated with empiric Ceftriaxone, IV fluids and aggressive K supplementation without improvement.  Urine culture negative, doubt infection was every present.  Subsequently evaluated by Psychiatry, who felt her presentation was consistent with previous psychotic episodes for which she had been evaluated by our Psychiatry department.    Started on IV haldol.  Psychiatry recommend inpatietn hospitalization.  Patient has been IVC'd.  Improved today, sitting up, more cooperative with nursing, taking PO medicines. -Consult psychiatry, appreciate cares -SW consult for [facility] transfer -Stop IV Haldol -Start oral Zyprexa -Continue fluoxetine   AKI Resolved  Hypokalemia Magnesium normal, potassium repleted.   Hypertension BP improved. -Continue home amlodipine -Continue PRN labetalol and hydralazine  SARS-CoV-2 testing was obtained for screening  purposes.  COVID ruled out.      MDM and disposition: The below labs and imaging reports reviewed and summarized above.  Medication management as above.  The patient was admitted with altered mentation, initially thought to be medical in nature.  Her dehydration, hypokalemia, and UTI were treated to completion, but she had no improvement in mentation, and it was felt that her situation was from catatonia or psychosis, and now psychiatry recommended inpatient psychiatric treatment.  We will continue treatment with oral Zyprexa, and pursue placement at psychiatric inpatient facility.        DVT prophylaxis: Lovenox Code Status: FULL Family Communication: Daughter and husband by phone    Consultants:   None  Procedures:   None  Antimicrobials:   Ceftriaxone    Subjective: No new fever, respiratory distress, diarrhea, vomiting, urinary incontinence, cough.  Objective: Vitals:   08/03/18 1656 08/04/18 0546 08/04/18 0932 08/04/18 1212  BP: (!) 152/91 (!) 151/102 (!) 154/90 (!) 174/95  Pulse: 91 91  99  Resp: (!) 21   17  Temp: 99 F (37.2 C)   (!) 97.4 F (36.3 C)  TempSrc: Axillary   Axillary  SpO2: 100%   100%  Weight:        Intake/Output Summary (Last 24 hours) at 08/04/2018 1609 Last data filed at 08/04/2018 1313 Gross per 24 hour  Intake 1094.88 ml  Output --  Net 1094.88 ml   Filed Weights   07/30/18 0925  Weight: 77.1 kg    Examination: General appearance: Thin adult female, sitting in recliner, no acute distress, avoids eye contact. HEENT: Anicteric, conjunctival pink, lids and lashes normal.  No nasal deformity, discharge, or epistaxis.  Lips dry, dentition poor, no oral lesions, oropharynx tacky dry, hearing normal. Skin: No suspicious  rashes or lesions. Cardiac: Regular rate and rhythm, no murmurs, no lower extremity edema. Respiratory: Respiratory rate and rhythm is normal, lungs clear without rales or wheezes. Abdomen: Abdomen soft and  nontender.  No hepatosplenomegaly or distention. MSK: No deformities or effusions of the large joints of the upper lower extremities bilaterally, diffuse loss subcutaneous muscle mass and fat. Neuro: Awake and alert, makes eye contact, does not respond to verbal stimuli very much, mumbles incoherently.  Strength 5/5 in the upper and lower extremities bilaterally.    Psych: Guarded, affect flat.      Data Reviewed: I have personally reviewed following labs and imaging studies:  CBC: Recent Labs  Lab 07/30/18 0931 07/31/18 0637 08/02/18 0328 08/03/18 0501 08/04/18 0242  WBC 8.0 7.8 5.3 5.8 8.4  HGB 14.3 14.8 13.3 14.2 13.6  HCT 45.3 46.1* 40.5 43.7 41.6  MCV 90.8 89.5 88.2 89.2 88.1  PLT 408* 395 332 326 264   Basic Metabolic Panel: Recent Labs  Lab 07/30/18 1111 07/31/18 0637 08/01/18 0246 08/02/18 0328 08/03/18 0501 08/04/18 0242  NA  --  142 141 141 142 139  K  --  3.1* 2.5* 2.7* 4.0 3.6  CL  --  101 100 105 104 105  CO2  --  27 29 27 26 22   GLUCOSE  --  135* 136* 126* 126* 105*  BUN  --  25* 14 <5* <5* 8  CREATININE  --  0.98 0.87 0.80 0.72 0.94  CALCIUM  --  9.3 8.7* 8.7* 9.2 9.2  MG 2.3  --   --   --   --   --    GFR: CrCl cannot be calculated (Unknown ideal weight.). Liver Function Tests: Recent Labs  Lab 07/30/18 0931  AST 31  ALT 29  ALKPHOS 69  BILITOT 1.0  PROT 8.0  ALBUMIN 4.4   No results for input(s): LIPASE, AMYLASE in the last 168 hours. Recent Labs  Lab 07/30/18 1111  AMMONIA 26   Coagulation Profile: No results for input(s): INR, PROTIME in the last 168 hours. Cardiac Enzymes: No results for input(s): CKTOTAL, CKMB, CKMBINDEX, TROPONINI in the last 168 hours. BNP (last 3 results) No results for input(s): PROBNP in the last 8760 hours. HbA1C: No results for input(s): HGBA1C in the last 72 hours. CBG: Recent Labs  Lab 07/30/18 1038  GLUCAP 82   Lipid Profile: No results for input(s): CHOL, HDL, LDLCALC, TRIG, CHOLHDL,  LDLDIRECT in the last 72 hours. Thyroid Function Tests: Recent Labs    08/02/18 0328  TSH 3.120   Anemia Panel: Recent Labs    08/03/18 0844  VITAMINB12 444   Urine analysis:    Component Value Date/Time   COLORURINE AMBER (A) 07/30/2018 1117   APPEARANCEUR HAZY (A) 07/30/2018 1117   LABSPEC 1.019 07/30/2018 1117   PHURINE 5.0 07/30/2018 1117   GLUCOSEU NEGATIVE 07/30/2018 1117   HGBUR NEGATIVE 07/30/2018 1117   BILIRUBINUR NEGATIVE 07/30/2018 1117   BILIRUBINUR n 06/02/2016 1251   KETONESUR 5 (A) 07/30/2018 1117   PROTEINUR 30 (A) 07/30/2018 1117   UROBILINOGEN negative 06/02/2016 1251   UROBILINOGEN 1.0 02/19/2014 1849   NITRITE NEGATIVE 07/30/2018 1117   LEUKOCYTESUR LARGE (A) 07/30/2018 1117   Sepsis Labs: @LABRCNTIP (procalcitonin:4,lacticacidven:4)  ) Recent Results (from the past 240 hour(s))  SARS Coronavirus 2 [facility](Hospital order, Performed in [facility] lab)     Status: None   Collection Time: 07/30/18 11:11 AM  Result Value Ref Range Status   SARS Coronavirus 2  NEGATIVE NEGATIVE Final    Comment: (NOTE) If result is NEGATIVE SARS-CoV-2 target nucleic acids are NOT DETECTED. The SARS-CoV-2 RNA is generally detectable in upper and lower  respiratory specimens during the acute phase of infection. The lowest  concentration of SARS-CoV-2 viral copies this assay can detect is 250  copies / mL. A negative result does not preclude SARS-CoV-2 infection  and should not be used as the sole basis for treatment or other  patient management decisions.  A negative result may occur with  improper specimen collection / handling, submission of specimen other  than nasopharyngeal swab, presence of viral mutation(s) within the  areas targeted by this assay, and inadequate number of viral copies  (<250 copies / mL). A negative result must be combined with clinical  observations, patient history, and epidemiological information. If result is POSITIVE SARS-CoV-2 target  nucleic acids are DETECTED. The SARS-CoV-2 RNA is generally detectable in upper and lower  respiratory specimens dur ing the acute phase of infection.  Positive  results are indicative of active infection with SARS-CoV-2.  Clinical  correlation with patient history and other diagnostic information is  necessary to determine patient infection status.  Positive results do  not rule out bacterial infection or co-infection with other viruses. If result is PRESUMPTIVE POSTIVE SARS-CoV-2 nucleic acids MAY BE PRESENT.   A presumptive positive result was obtained on the submitted specimen  and confirmed on repeat testing.  While 2019 novel coronavirus  (SARS-CoV-2) nucleic acids may be present in the submitted sample  additional confirmatory testing may be necessary for epidemiological  and / or clinical management purposes  to differentiate between  SARS-CoV-2 and other Sarbecovirus currently known to infect humans.  If clinically indicated additional testing with an alternate test  methodology [phone]) is advised. The SARS-CoV-2 RNA is generally  detectable in upper and lower respiratory sp ecimens during the acute  phase of infection. The expected result is Negative. Fact Sheet for Patients:  BoilerBrush.com.cy Fact Sheet for Healthcare Providers: https://pope.com/ This test is not yet approved or cleared by the [place] FDA and has been authorized for detection and/or diagnosis of SARS-CoV-2 by FDA under an Emergency Use Authorization (EUA).  This EUA will remain in effect (meaning this test can be used) for the duration of the COVID-19 declaration under Section 564(b)(1) of the Act, 21 U.S.C. section 360bbb-3(b)(1), unless the authorization is terminated or revoked sooner. Performed at [facility], [address] [place], [place] [address]   Urine Culture     Status: Abnormal   Collection Time: 07/30/18 11:17 AM  Result  Value Ref Range Status   Specimen Description URINE, RANDOM  Final   Special Requests   Final    NONE Performed at [facility], [address], [place], [place] [address]    Culture MULTIPLE SPECIES PRESENT, SUGGEST RECOLLECTION (A)  Final   Report Status 07/31/2018 FINAL  Final  MRSA PCR Screening     Status: None   Collection Time: 07/30/18  6:12 PM  Result Value Ref Range Status   MRSA by PCR NEGATIVE NEGATIVE Final    Comment:        The GeneXpert MRSA Assay (FDA approved for NASAL specimens only), is one component of a comprehensive MRSA colonization surveillance program. It is not intended to diagnose MRSA infection nor to guide or monitor treatment for MRSA infections. Performed at [facility], [address], [place], [place] [address]   Culture, Urine  Status: None   Collection Time: 08/01/18  3:00 PM  Result Value Ref Range Status   Specimen Description URINE, RANDOM  Final   Special Requests NONE  Final   Culture   Final    NO GROWTH Performed at [facility], [address] [place], [place] [address]    Report Status 08/02/2018 FINAL  Final         Radiology Studies: No results found.      Scheduled Meds:  amLODipine  5 mg Oral Daily   enoxaparin (LOVENOX) injection  40 mg Subcutaneous Q24H   FLUoxetine  20 mg Oral Daily   [START ON 08/05/2018] OLANZapine  5 mg Oral Daily   sodium chloride flush  10-40 mL Intracatheter Q12H   sodium chloride flush  3 mL Intravenous Q12H   Continuous Infusions:  sodium chloride     lactated ringers Stopped (08/04/18 0946)     LOS: 4 days    Time spent: 25 minutes    Alberteen Sam, MD [facility] 08/04/2018, 4:09 PM     Please page through AMION:  [email or web address] Password [email or web address] If 7PM-7AM, please contact night-coverage

## 2018-08-04 NOTE — Progress Notes (Signed)
Patient refused vital signs and other treatments Sitter 1:1 on progress, no issues noted.  Will continue to monitor.

## 2018-08-05 MED ORDER — LACTATED RINGERS IV SOLN
INTRAVENOUS | Status: DC
  Administered 2018-08-05 – 2018-08-06 (×3): via INTRAVENOUS

## 2018-08-05 NOTE — TOC Progression Note (Signed)
Transition of Care [facility]) - Progression Note    Patient Details  Name: Amy Casey MRN: [id number] Date of Birth: 04-24-1960  Transition of Care [facility]) CM/SW Contact  Mearl Latin, [place] Phone Number: [phone] 08/05/2018, 3:06 PM  Clinical Narrative:    CSW also sent referral to [facility] for review.                                                     Social Determinants of Health (SDOH) Interventions    Readmission Risk Interventions No flowsheet data found.

## 2018-08-05 NOTE — Progress Notes (Signed)
PROGRESS NOTE    Amy Casey  ZOX:[id number] DOB: 05-12-1960 DOA: 07/30/2018 PCP: Jac Canavan, PA-C      Brief Narrative:  [name] is a 58 y.o. F with Bipolar disorder, HTN, who presents with altered mental status.  Per her husband she had 3 days of altered mentation, poor appetite, she quit taking her psychiatric medications.  Increased urinary frequency.  Her initial physical examination blood pressure was 151/61, pulse rate 72, temperature 98.5, respiratory 27, oxygen saturation 96%.  Her lungs were clear to auscultation bilaterally, heart S1-S2 present and rhythmic, abdomen soft, no lower extremity edema.  Urinalysis had 6-10 white cells, 0-5 red cells.  Chest x-ray negative for infiltrates, CT head negative for acute changes.   Patient was admitted to the hospital with a working diagnosis of acute toxic/ metabolic encephalopathy.    Assessment & Plan:  Acute psychosis Paranoid schizophrenia Patient admitted and initially thought to be somnolent/altered due to UTI, compounded by dehydration and hypokalemia.  She was treated with empiric Ceftriaxone, IV fluids and aggressive K supplementation without improvement.  Urine culture negative, doubt infection was every present.  Subsequently evaluated by Psychiatry, who felt her presentation was consistent with previous psychotic episodes for which she had been evaluated by our Psychiatry department.    Started on IV haldol.  Psychiatry recommend inpatietn hospitalization.  Patient has been IVC'd.  Improved today, sitting up, more cooperative with nursing, taking PO medicines. -Consult psychiatry, appreciate cares -SW consult for [facility] transfer -Stop IV Haldol -Continue Zyprexa -Continue Fluoxetine   AKI Resolved  Hypokalemia Magnesium normal, potassium repleted.   Hypertension BP improved. -Continue home amlodipine -Continue PRN labetalol and hydralazine  SARS-CoV-2 testing was obtained for screening  purposes.  COVID ruled out.      MDM and disposition: The below labs and imaging reports reviewed and summarized above.  Medication management as above.  The patient was admitted with altered mentation, initially thought to be medical in nature.  Her dehydration, hypokalemia, and UTI were treated to completion, but she had no improvement in mentation, and it was felt that her situation was from catatonia or psychosis, and now psychiatry recommended inpatient psychiatric treatment.  Pending psychiatric inpatient placement.     DVT prophylaxis: Lovenox Code Status: FULL Family Communication: Daughter and husband by phone    Consultants:   None  Procedures:   None  Antimicrobials:   Ceftriaxone    Subjective: No new fever, respiratory distress, vomiting.  Objective: Vitals:   08/05/18 0902 08/05/18 1420 08/05/18 1438 08/05/18 1533  BP: (!) 168/97 (!) 170/98 (!) 175/89 (!) 141/73  Pulse: (!) 125 (!) 121 89 (!) 125  Resp: Temp: 97.6 F (36.4 C) 98 F (36.7 C) 98 F (36.7 C) (!) 97.4 F (36.3 C)  TempSrc: Oral Oral Oral Oral  SpO2: 99% 100% 99% 100%  Weight:        Intake/Output Summary (Last 24 hours) at 08/05/2018 1715 Last data filed at 08/04/2018 2036 Gross per 24 hour  Intake 13 ml  Output --  Net 13 ml   Filed Weights   07/30/18 0925  Weight: 77.1 kg    Examination: General appearance: Thin adult female, sitting in bed, no acute distress, avoids eye contact. HEENT: Anicteric, conjunctival pink, lids and lashes normal.  No nasal deformity, discharge, or epistaxis.  Lips dry, dentition poor, no oral lesions, oropharynx tacky dry, hearing normal. Skin: No suspicious rashes or lesions. Cardiac: Regular rate and rhythm, no murmurs,  no lower extremity edema. Respiratory: Respiratory rate and rhythm is normal, lungs clear without rales or wheezes. Abdomen: Abdomen soft and nontender.  No hepatosplenomegaly or distention. MSK: No deformities  or effusions of the large joints of the upper lower extremities bilaterally, diffuse loss subcutaneous muscle mass and fat. Neuro: Awake and alert, makes eye contact, does not respond to verbal stimuli very much, mumbles incoherently.  Strength 5/5 in the upper and lower extremities bilaterally.    Psych: Guarded, affect flat.      Data Reviewed: I have personally reviewed following labs and imaging studies:  CBC: Recent Labs  Lab 07/30/18 0931 07/31/18 0637 08/02/18 0328 08/03/18 0501 08/04/18 0242  WBC 8.0 7.8 5.3 5.8 8.4  HGB 14.3 14.8 13.3 14.2 13.6  HCT 45.3 46.1* 40.5 43.7 41.6  MCV 90.8 89.5 88.2 89.2 88.1  PLT 408* 395 332 326 264   Basic Metabolic Panel: Recent Labs  Lab 07/30/18 1111 07/31/18 0637 08/01/18 0246 08/02/18 0328 08/03/18 0501 08/04/18 0242  NA  --  142 141 141 142 139  K  --  3.1* 2.5* 2.7* 4.0 3.6  CL  --  101 100 105 104 105  CO2  --  GLUCOSE  --  135* 136* 126* 126* 105*  BUN  --  25* 14 <5* <5* 8  CREATININE  --  0.98 0.87 0.80 0.72 0.94  CALCIUM  --  9.3 8.7* 8.7* 9.2 9.2  MG 2.3  --   --   --   --   --    GFR: CrCl cannot be calculated (Unknown ideal weight.). Liver Function Tests: Recent Labs  Lab 07/30/18 0931  AST 31  ALT 29  ALKPHOS 69  BILITOT 1.0  PROT 8.0  ALBUMIN 4.4   No results for input(s): LIPASE, AMYLASE in the last 168 hours. Recent Labs  Lab 07/30/18 1111  AMMONIA 26   Coagulation Profile: No results for input(s): INR, PROTIME in the last 168 hours. Cardiac Enzymes: No results for input(s): CKTOTAL, CKMB, CKMBINDEX, TROPONINI in the last 168 hours. BNP (last 3 results) No results for input(s): PROBNP in the last 8760 hours. HbA1C: No results for input(s): HGBA1C in the last 72 hours. CBG: Recent Labs  Lab 07/30/18 1038  GLUCAP 82   Lipid Profile: No results for input(s): CHOL, HDL, LDLCALC, TRIG, CHOLHDL, LDLDIRECT in the last 72 hours. Thyroid Function Tests: No results for  input(s): TSH, T4TOTAL, FREET4, T3FREE, THYROIDAB in the last 72 hours. Anemia Panel: Recent Labs    08/03/18 0844  VITAMINB12 444   Urine analysis:    Component Value Date/Time   COLORURINE AMBER (A) 07/30/2018 1117   APPEARANCEUR HAZY (A) 07/30/2018 1117   LABSPEC 1.019 07/30/2018 1117   PHURINE 5.0 07/30/2018 1117   GLUCOSEU NEGATIVE 07/30/2018 1117   HGBUR NEGATIVE 07/30/2018 1117   BILIRUBINUR NEGATIVE 07/30/2018 1117   BILIRUBINUR n 06/02/2016 1251   KETONESUR 5 (A) 07/30/2018 1117   PROTEINUR 30 (A) 07/30/2018 1117   UROBILINOGEN negative 06/02/2016 1251   UROBILINOGEN 1.0 02/19/2014 1849   NITRITE NEGATIVE 07/30/2018 1117   LEUKOCYTESUR LARGE (A) 07/30/2018 1117   Sepsis Labs: (procalcitonin:4,lacticacidven:4)  ) Recent Results (from the past 240 hour(s))  SARS Coronavirus 2 [facility] order, Performed in [facility] hospital lab)     Status: None   Collection Time: 07/30/18 11:11 AM  Result Value Ref Range Status   SARS Coronavirus 2 NEGATIVE NEGATIVE Final    Comment: (NOTE)  If result is NEGATIVE SARS-CoV-2 target nucleic acids are NOT DETECTED. The SARS-CoV-2 RNA is generally detectable in upper and lower  respiratory specimens during the acute phase of infection. The lowest  concentration of SARS-CoV-2 viral copies this assay can detect is 250  copies / mL. A negative result does not preclude SARS-CoV-2 infection  and should not be used as the sole basis for treatment or other  patient management decisions.  A negative result may occur with  improper specimen collection / handling, submission of specimen other  than nasopharyngeal swab, presence of viral mutation(s) within the  areas targeted by this assay, and inadequate number of viral copies  (<250 copies / mL). A negative result must be combined with clinical  observations, patient history, and epidemiological information. If result is POSITIVE SARS-CoV-2 target nucleic acids are  DETECTED. The SARS-CoV-2 RNA is generally detectable in upper and lower  respiratory specimens dur ing the acute phase of infection.  Positive  results are indicative of active infection with SARS-CoV-2.  Clinical  correlation with patient history and other diagnostic information is  necessary to determine patient infection status.  Positive results do  not rule out bacterial infection or co-infection with other viruses. If result is PRESUMPTIVE POSTIVE SARS-CoV-2 nucleic acids MAY BE PRESENT.   A presumptive positive result was obtained on the submitted specimen  and confirmed on repeat testing.  While 2019 novel coronavirus  (SARS-CoV-2) nucleic acids may be present in the submitted sample  additional confirmatory testing may be necessary for epidemiological  and / or clinical management purposes  to differentiate between  SARS-CoV-2 and other Sarbecovirus currently known to infect humans.  If clinically indicated additional testing with an alternate test  methodology [phone] is advised. The SARS-CoV-2 RNA is generally  detectable in upper and lower respiratory sp ecimens during the acute  phase of infection. The expected result is Negative. Fact Sheet for Patients:  BoilerBrush.com.cyhttps://www.fda.gov/media/136312/download Fact Sheet for Healthcare Providers: https://pope.com/https://www.fda.gov/media/136313/download This test is not yet approved or cleared by the [place] FDA and has been authorized for detection and/or diagnosis of SARS-CoV-2 by FDA under an Emergency Use Authorization (EUA).  This EUA will remain in effect (meaning this test can be used) for the duration of the COVID-19 declaration under Section 564(b)(1) of the Act, 21 U.S.C. section 360bbb-3(b)(1), unless the authorization is terminated or revoked sooner. Performed at [facility], [address] [place], [place] [address]   Urine Culture     Status: Abnormal   Collection Time: 07/30/18 11:17 AM  Result Value Ref Range  Status   Specimen Description URINE, RANDOM  Final   Special Requests   Final    NONE Performed at [facility], [address] [place], [place] [address]    Culture MULTIPLE SPECIES PRESENT, SUGGEST RECOLLECTION (A)  Final   Report Status 07/31/2018 FINAL  Final  MRSA PCR Screening     Status: None   Collection Time: 07/30/18  6:12 PM  Result Value Ref Range Status   MRSA by PCR NEGATIVE NEGATIVE Final    Comment:        The GeneXpert MRSA Assay (FDA approved for NASAL specimens only), is one component of a comprehensive MRSA colonization surveillance program. It is not intended to diagnose MRSA infection nor to guide or monitor treatment for MRSA infections. Performed at [facility], [address] [place], [place] [address]   Culture, Urine     Status: None  Collection Time: 08/01/18  3:00 PM  Result Value Ref Range Status   Specimen Description URINE, RANDOM  Final   Special Requests NONE  Final   Culture   Final    NO GROWTH Performed at [facility], [address], [place], [place] [address]    Report Status 08/02/2018 FINAL  Final         Radiology Studies: No results found.      Scheduled Meds:  amLODipine  5 mg Oral Daily   enoxaparin (LOVENOX) injection  40 mg Subcutaneous Q24H   FLUoxetine  20 mg Oral Daily   OLANZapine  5 mg Oral Daily   sodium chloride flush  10-40 mL Intracatheter Q12H   sodium chloride flush  3 mL Intravenous Q12H   Continuous Infusions:  sodium chloride     lactated ringers Stopped (08/04/18 0946)   lactated ringers 100 mL/hr at 08/05/18 0928     LOS: 5 days    Time spent: 15 minutes    Alberteen Sam, MD [facility] 08/05/2018, 5:15 PM     Please page through AMION:  [email or web address] Password [email or web address] If 7PM-7AM, please contact night-coverage

## 2018-08-06 LAB — CBC
HCT: 39.4 % (ref 36.0–46.0)
Hemoglobin: 12.9 g/dL (ref 12.0–15.0)
MCH: 28.7 pg (ref 26.0–34.0)
MCHC: 32.7 g/dL (ref 30.0–36.0)
MCV: 87.6 fL (ref 80.0–100.0)
Platelets: 340 10*3/uL (ref 150–400)
RBC: 4.5 MIL/uL (ref 3.87–5.11)
RDW: 12.2 % (ref 11.5–15.5)
WBC: 7.1 10*3/uL (ref 4.0–10.5)
nRBC: 0 % (ref 0.0–0.2)

## 2018-08-06 LAB — BASIC METABOLIC PANEL
Anion gap: 14 (ref 5–15)
BUN: 11 mg/dL (ref 6–20)
CO2: 23 mmol/L (ref 22–32)
Calcium: 8.8 mg/dL — ABNORMAL LOW (ref 8.9–10.3)
Chloride: 103 mmol/L (ref 98–111)
Creatinine, Ser: 0.77 mg/dL (ref 0.44–1.00)
GFR calc Af Amer: >60 mL/min (ref >60)
GFR calc non Af Amer: >60 mL/min (ref >60)
Glucose, Bld: 82 mg/dL (ref 70–99)
Potassium: 3.3 mmol/L — ABNORMAL LOW (ref 3.5–5.1)
Sodium: 140 mmol/L (ref 135–145)

## 2018-08-06 MED ORDER — SODIUM CHLORIDE 0.9 % IV BOLUS
1000.0000 mL | Freq: Once | INTRAVENOUS | Status: AC
  Administered 2018-08-06: 1000 mL via INTRAVENOUS

## 2018-08-06 MED ORDER — HYDROCHLOROTHIAZIDE 12.5 MG PO CAPS
12.5000 mg | ORAL_CAPSULE | Freq: Every day | ORAL | Status: DC
  Administered 2018-08-07: 12.5 mg via ORAL
  Filled 2018-08-06 (×4): qty 1

## 2018-08-06 MED ORDER — POTASSIUM CHLORIDE CRYS ER 20 MEQ PO TBCR
40.0000 meq | EXTENDED_RELEASE_TABLET | Freq: Once | ORAL | Status: AC
  Administered 2018-08-06: 40 meq via ORAL
  Filled 2018-08-06: qty 2

## 2018-08-06 NOTE — Progress Notes (Signed)
PROGRESS NOTE    Amy Casey  ZOX:[id number] DOB: 1960/05/06 DOA: 07/30/2018 PCP: Jac Canavan, PA-C      Brief Narrative:  Amy Casey is a 58 y.o. F with Bipolar disorder, HTN, who presents with altered mental status.  Per her husband she had 3 days of altered mentation, poor appetite, she quit taking her psychiatric medications.  Increased urinary frequency.  Her initial physical examination blood pressure was 151/61, pulse rate 72, temperature 98.5, respiratory 27, oxygen saturation 96%.  Her lungs were clear to auscultation bilaterally, heart S1-S2 present and rhythmic, abdomen soft, no lower extremity edema.  Urinalysis had 6-10 white cells, 0-5 red cells.  Chest x-ray negative for infiltrates, CT head negative for acute changes.   Patient was admitted to the hospital with a working diagnosis of acute toxic/ metabolic encephalopathy.    Assessment & Plan:  Acute psychosis Paranoid schizophrenia Patient admitted and initially thought to be somnolent/altered due to UTI, compounded by dehydration and hypokalemia.  She was treated with empiric Ceftriaxone, IV fluids and aggressive K supplementation without improvement.  Urine culture negative, doubt infection was every present.  Subsequently evaluated by Psychiatry, who felt her presentation was consistent with previous psychotic episodes for which she had been evaluated by our Psychiatry department.    Started on IV haldol.  Psychiatry recommend inpatietn hospitalization.  Patient has been IVC'd.  Has been taking PO medicines, but today is quite weak and taking no food for the second day in a row.  Discussed with Psychiatry, who recommend continuing Zyprexa, which would stimulate appetite, no further medical therapy at this time for catatonia.  -Gentle fluids this afternoon -Check BMP and CBC, if abnormal, may have to start maintenance IV fluids  -Consult psychiatry, appreciate cares -Continue olanzapine and  fluoxetine    AKI Resolved  Hypokalemia Magnesium normal, potassium repleted.  Hypertension BP elevated -Continue amlodipine -Start HCTZ -Continue PRN labetalol and hydralazine  SARS-CoV-2 testing was obtained for screening purposes.  COVID ruled out.      MDM and disposition: The below labs and imaging reports were reviewed and summarized above.  Medication management as above.  The patient was admitted with altered mentation, initially thought to be medical in nature.  Her dehydration, hypokalemia, and UTI were treated to completion, but she had no improvement in mentation, and it was felt that her situation was from catatonia or psychosis, and now psychiatry recommended inpatient psychiatric treatment.  Pending psychiatric inpatient placement.     DVT prophylaxis: Lovenox Code Status: FULL Family Communication: Husband by phone    Consultants:   None  Procedures:   None  Antimicrobials:   Ceftriaxone    Subjective: No new fever, respiratory distress, vomiting, diarrhea.  The patient is not taking p.o. other than medicines.  Objective: Vitals:   08/05/18 1533 08/05/18 2100 08/06/18 0433 08/06/18 1532  BP: (!) 141/73 (!) 167/92 (!) 165/91 (!) 154/82  Pulse: (!) 125 91 76 94  Resp: Temp: (!) 97.4 F (36.3 C) (!) 97.5 F (36.4 C) 97.9 F (36.6 C) 97.6 F (36.4 C)  TempSrc: Oral Oral Oral Oral  SpO2: 100% 100% 99%   Weight:        Intake/Output Summary (Last 24 hours) at 08/06/2018 1747 Last data filed at 08/06/2018 1452 Gross per 24 hour  Intake 2053.66 ml  Output -  Net 2053.66 ml   Filed Weights   07/30/18 0925  Weight: 77.1 kg    Examination: General appearance: Thin  adult female, sitting in bed, no acute distress, avoids eye contact. HEENT: Anicteric, conjunctival pink, lids and lashes normal.  No nasal deformity, discharge, or epistaxis.  Lips dry, dentition poor, no oral lesions, oropharynx tacky dry, hearing normal.  Skin: No suspicious rashes or lesions. Cardiac: Rate and rhythm, no murmurs, no lower extremity edema. Respiratory: Respiratory rate and rhythm is normal.  Lungs clear without rales or wheezes. Abdomen: Abdomen soft and nontender, no hepatosplenomegaly or distention. MSK: No deformities or effusions of the large joints of the upper lower extremities bilaterally, diffuse loss subcutaneous muscle mass and fat. Neuro: Awake alert, makes no eye contact, does not respond to verbal stimuli, does not follow commands. Psych: Noted, flat.      Data Reviewed: I have personally reviewed following labs and imaging studies:  CBC: Recent Labs  Lab 07/31/18 0637 08/02/18 0328 08/03/18 0501 08/04/18 0242  WBC 7.8 5.3 5.8 8.4  HGB 14.8 13.3 14.2 13.6  HCT 46.1* 40.5 43.7 41.6  MCV 89.5 88.2 89.2 88.1  PLT 395 332 326 264   Basic Metabolic Panel: Recent Labs  Lab 07/31/18 0637 08/01/18 0246 08/02/18 0328 08/03/18 0501 08/04/18 0242  NA 142 141 141 142 139  K 3.1* 2.5* 2.7* 4.0 3.6  CL 101 100 105 104 105  CO2 GLUCOSE 135* 136* 126* 126* 105*  BUN 25* 14 <5* <5* 8  CREATININE 0.98 0.87 0.80 0.72 0.94  CALCIUM 9.3 8.7* 8.7* 9.2 9.2   GFR: CrCl cannot be calculated (Unknown ideal weight.). Liver Function Tests: No results for input(s): AST, ALT, ALKPHOS, BILITOT, PROT, ALBUMIN in the last 168 hours. No results for input(s): LIPASE, AMYLASE in the last 168 hours. No results for input(s): AMMONIA in the last 168 hours. Coagulation Profile: No results for input(s): INR, PROTIME in the last 168 hours. Cardiac Enzymes: No results for input(s): CKTOTAL, CKMB, CKMBINDEX, TROPONINI in the last 168 hours. BNP (last 3 results) No results for input(s): PROBNP in the last 8760 hours. HbA1C: No results for input(s): HGBA1C in the last 72 hours. CBG: No results for input(s): GLUCAP in the last 168 hours. Lipid Profile: No results for input(s): CHOL, HDL, LDLCALC, TRIG,  CHOLHDL, LDLDIRECT in the last 72 hours. Thyroid Function Tests: No results for input(s): TSH, T4TOTAL, FREET4, T3FREE, THYROIDAB in the last 72 hours. Anemia Panel: No results for input(s): VITAMINB12, FOLATE, FERRITIN, TIBC, IRON, RETICCTPCT in the last 72 hours. Urine analysis:    Component Value Date/Time   COLORURINE AMBER (A) 07/30/2018 1117   APPEARANCEUR HAZY (A) 07/30/2018 1117   LABSPEC 1.019 07/30/2018 1117   PHURINE 5.0 07/30/2018 1117   GLUCOSEU NEGATIVE 07/30/2018 1117   HGBUR NEGATIVE 07/30/2018 1117   BILIRUBINUR NEGATIVE 07/30/2018 1117   BILIRUBINUR n 06/02/2016 1251   KETONESUR 5 (A) 07/30/2018 1117   PROTEINUR 30 (A) 07/30/2018 1117   UROBILINOGEN negative 06/02/2016 1251   UROBILINOGEN 1.0 02/19/2014 1849   NITRITE NEGATIVE 07/30/2018 1117   LEUKOCYTESUR LARGE (A) 07/30/2018 1117   Sepsis Labs: (procalcitonin:4,lacticacidven:4)  ) Recent Results (from the past 240 hour(s))  SARS Coronavirus 2 [facility] order, Performed in [facility] hospital lab)     Status: None   Collection Time: 07/30/18 11:11 AM  Result Value Ref Range Status   SARS Coronavirus 2 NEGATIVE NEGATIVE Final    Comment: (NOTE) If result is NEGATIVE SARS-CoV-2 target nucleic acids are NOT DETECTED. The SARS-CoV-2 RNA is generally detectable in upper and lower  respiratory specimens  during the acute phase of infection. The lowest  concentration of SARS-CoV-2 viral copies this assay can detect is 250  copies / mL. A negative result does not preclude SARS-CoV-2 infection  and should not be used as the sole basis for treatment or other  patient management decisions.  A negative result may occur with  improper specimen collection / handling, submission of specimen other  than nasopharyngeal swab, presence of viral mutation(s) within the  areas targeted by this assay, and inadequate number of viral copies  (<250 copies / mL). A negative result must be combined with clinical   observations, patient history, and epidemiological information. If result is POSITIVE SARS-CoV-2 target nucleic acids are DETECTED. The SARS-CoV-2 RNA is generally detectable in upper and lower  respiratory specimens dur ing the acute phase of infection.  Positive  results are indicative of active infection with SARS-CoV-2.  Clinical  correlation with patient history and other diagnostic information is  necessary to determine patient infection status.  Positive results do  not rule out bacterial infection or co-infection with other viruses. If result is PRESUMPTIVE POSTIVE SARS-CoV-2 nucleic acids MAY BE PRESENT.   A presumptive positive result was obtained on the submitted specimen  and confirmed on repeat testing.  While 2019 novel coronavirus  (SARS-CoV-2) nucleic acids may be present in the submitted sample  additional confirmatory testing may be necessary for epidemiological  and / or clinical management purposes  to differentiate between  SARS-CoV-2 and other Sarbecovirus currently known to infect humans.  If clinically indicated additional testing with an alternate test  methodology [phone]) is advised. The SARS-CoV-2 RNA is generally  detectable in upper and lower respiratory sp ecimens during the acute  phase of infection. The expected result is Negative. Fact Sheet for Patients:  BoilerBrush.com.cy Fact Sheet for Healthcare Providers: https://pope.com/ This test is not yet approved or cleared by the [place] FDA and has been authorized for detection and/or diagnosis of SARS-CoV-2 by FDA under an Emergency Use Authorization (EUA).  This EUA will remain in effect (meaning this test can be used) for the duration of the COVID-19 declaration under Section 564(b)(1) of the Act, 21 U.S.C. section 360bbb-3(b)(1), unless the authorization is terminated or revoked sooner. Performed at [facility], [address]  [place], [place] [address]   Urine Culture     Status: Abnormal   Collection Time: 07/30/18 11:17 AM  Result Value Ref Range Status   Specimen Description URINE, RANDOM  Final   Special Requests   Final    NONE Performed at [facility], [address] [place], [place] [address]    Culture MULTIPLE SPECIES PRESENT, SUGGEST RECOLLECTION (A)  Final   Report Status 07/31/2018 FINAL  Final  MRSA PCR Screening     Status: None   Collection Time: 07/30/18  6:12 PM  Result Value Ref Range Status   MRSA by PCR NEGATIVE NEGATIVE Final    Comment:        The GeneXpert MRSA Assay (FDA approved for NASAL specimens only), is one component of a comprehensive MRSA colonization surveillance program. It is not intended to diagnose MRSA infection nor to guide or monitor treatment for MRSA infections. Performed at [facility], [address], [place], [place] [address]   Culture, Urine     Status: None   Collection Time: 08/01/18  3:00 PM  Result Value Ref Range Status   Specimen Description URINE, RANDOM  Final   Special Requests  NONE  Final   Culture   Final    NO GROWTH Performed at [facility], [address] [place], [place] [address]    Report Status 08/02/2018 FINAL  Final         Radiology Studies: No results found.      Scheduled Meds: . amLODipine  5 mg Oral Daily  . enoxaparin (LOVENOX) injection  40 mg Subcutaneous Q24H  . FLUoxetine  20 mg Oral Daily  . OLANZapine  5 mg Oral Daily  . sodium chloride flush  10-40 mL Intracatheter Q12H  . sodium chloride flush  3 mL Intravenous Q12H   Continuous Infusions: . sodium chloride    . lactated ringers Stopped (08/04/18 0946)  . lactated ringers Stopped (08/06/18 1351)     LOS: 6 days    Time spent: 25 minutes    Alberteen Samhristopher P Amaranta Mehl, MD [facility] 08/06/2018, 5:47 PM     Please page through AMION:  [email or web address] Password [email or web address] If 7PM-7AM, please contact night-coverage

## 2018-08-06 NOTE — TOC Progression Note (Signed)
Transition of Care [facility]) - Progression Note    Patient Details  Name: Amy Casey MRN: [id number] Date of Birth: 03/07/1961  Transition of Care [facility]) CM/SW Contact  Mearl Latin, LCSW Phone Number: 08/06/2018, 10:19 AM  Clinical Narrative:    [facility] going to consult with their MD due to concerns that patient was not medically stable. CSW explained patient is stable per our MD.        Expected Discharge Plan and Services                                                 Social Determinants of Health (SDOH) Interventions    Readmission Risk Interventions No flowsheet data found.

## 2018-08-07 MED ORDER — SODIUM CHLORIDE 0.9 % IV BOLUS
1000.0000 mL | Freq: Once | INTRAVENOUS | Status: AC
  Administered 2018-08-07: 1000 mL via INTRAVENOUS

## 2018-08-07 MED ORDER — LORAZEPAM 2 MG/ML IJ SOLN
1.0000 mg | Freq: Three times a day (TID) | INTRAMUSCULAR | Status: AC
  Administered 2018-08-07 – 2018-08-08 (×3): 1 mg via INTRAVENOUS
  Filled 2018-08-07 (×3): qty 1

## 2018-08-07 NOTE — Progress Notes (Signed)
CSW received call from [facility] intake requesting more info on patient. CSW provided RN's contact number.  Osborne Casco Perla Echavarria LCSW [phone]

## 2018-08-07 NOTE — Consult Note (Addendum)
[facility] Consult Progress Note  08/07/2018 3:25 PM Harvel QualeBeverly Sheils  MRN:  [id number] Subjective:   Ms. [name] was last seen by the psychiatry consult service on 5/8 for altered mental status. Her presentation was thought to be secondary to psychotic decompensation in the setting of stopping her psychotropic medications. Primary team is concerned for catatonia given mutism, staring and lack of PO intake over the past 3 days. She has been receiving IV hydration. Patient has been unable to place due to current condition. [facility] is willing to consider placement in their medical unit for ECT for catatonia if patient meets criteria and is an appropriate candidate. Primary team reports that she seemed to improve with IV Haldol on 5/8 but then she was switched to IM Zyprexa after refusal of PO medications to avoid QTc prolongation. Home medications include Cogentin 0.5 mg daily, Zyprexa 10 mg qhs and Prozac 60 mg daily. She was started on IV Ativan 1 mg TID today given concern for catatonia.   On interview, Ms. [name] is able to appropriately answer questions although she is delayed in responding and she is inaudible at times when she speaks in almost a whisper. She denies feeling depressed. She denies SI, HI or AVH. She is oriented to person, place and time. She is able to follow simple commands. She denies any somatic symptoms. Her sitter reports that she was not speaking or eating at all earlier so it appears that she may have had a partial response to Ativan.    Principal Problem: Paranoid schizophrenia (HCC) Diagnosis:  Principal Problem:   Paranoid schizophrenia (HCC) Active Problems:   Noncompliance   Acute lower UTI   Hypokalemia   Acute metabolic encephalopathy   Encephalopathy  Total Time spent with patient: 15 minutes  Past Psychiatric History: Schizophrenia and depression.   Past Medical History:  Past Medical History:  Diagnosis Date  . Depression    Dr. Mila HomerSena, [facility];  hospitalization 07/2010  . History of echocardiogram 2011   [facility]  . History of renal stone   . Hypertension   . Hypokalemia   . Obesity   . Schizophrenia (HCC)   . Wears glasses     Past Surgical History:  Procedure Laterality Date  . WISDOM TOOTH EXTRACTION     Family History:  Family History  Problem Relation Age of Onset  . Diabetes Mother   . Kidney disease Mother        dialysis  . Heart disease Mother   . Cancer Maternal Uncle        lung  . Stroke Neg Hx   . Hypertension Neg Hx   . Hyperlipidemia Neg Hx    Family Psychiatric  History: Mother had mental health problems Social History:  Social History   Substance and Sexual Activity  Alcohol Use No     Social History   Substance and Sexual Activity  Drug Use No    Social History   Socioeconomic History  . Marital status: Married    Spouse name: Not on file  . Number of children: Not on file  . Years of education: Not on file  . Highest education level: Not on file  Occupational History  . Not on file  Social Needs  . Financial resource strain: Not on file  . Food insecurity:    Worry: Not on file    Inability: Not on file  . Transportation needs:    Medical: Not on file    Non-medical: Not on  file  Tobacco Use  . Smoking status: Never Smoker  . Smokeless tobacco: Never Used  Substance and Sexual Activity  . Alcohol use: No  . Drug use: No  . Sexual activity: Not Currently  Lifestyle  . Physical activity:    Days per week: Not on file    Minutes per session: Not on file  . Stress: Not on file  Relationships  . Social connections:    Talks on phone: Not on file    Gets together: Not on file    Attends religious service: Not on file    Active member of club or organization: Not on file    Attends meetings of clubs or organizations: Not on file    Relationship status: Not on file  Other Topics Concern  . 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 0.9 %  sodium chloride infusion  250 mL Intravenous PRN Emokpae, Courage, MD      . acetaminophen (TYLENOL) tablet 650 mg  650 mg Oral Q6H PRN Mariea Clonts, Courage, MD   650 mg at 08/05/18 1548   Or  . acetaminophen (TYLENOL) suppository 650 mg  650 mg Rectal Q6H PRN Emokpae, Courage, MD      . albuterol (PROVENTIL) (2.5 MG/3ML) 0.083% nebulizer solution 2.5 mg  2.5 mg Nebulization Q2H PRN Emokpae, Courage, MD      . amLODipine (NORVASC) tablet 5 mg  5 mg Oral Daily Emokpae, Courage, MD   5 mg at 08/06/18 1008  . enoxaparin (LOVENOX) injection 40 mg  40 mg Subcutaneous Q24H Arrien, York Ram, MD   40 mg at 08/07/18 1350  . FLUoxetine (PROZAC) capsule 20 mg  20 mg Oral Daily Emokpae, Courage, MD   20 mg at 08/07/18 1015  . hydrALAZINE (APRESOLINE) injection 10 mg  10 mg Intravenous Q6H PRN Shon Hale, MD   10 mg at 08/07/18 0618  . hydrochlorothiazide (MICROZIDE) capsule 12.5 mg  12.5 mg Oral Daily Danford, Earl Lites, MD   12.5 mg at 08/07/18 1024  . labetalol (NORMODYNE) injection 10 mg  10 mg Intravenous Q6H PRN Danford, Earl Lites, MD      . LORazepam (ATIVAN) injection 1 mg  1 mg Intravenous Q8H Danford, Earl Lites, MD   1 mg at 08/07/18 1350  . OLANZapine (ZYPREXA) tablet 5 mg  5 mg Oral Daily Danford, Earl Lites, MD   5 mg at 08/06/18 1008  . ondansetron (ZOFRAN) tablet 4 mg  4 mg Oral Q6H PRN Emokpae, Courage, MD       Or  . ondansetron (ZOFRAN) injection 4 mg  4 mg Intravenous Q6H PRN Emokpae, Courage, MD      . polyethylene glycol (MIRALAX / GLYCOLAX) packet 17 g  17 g Oral Daily PRN Emokpae, Courage, MD      . sodium chloride flush (NS) 0.9 % injection 10-40 mL  10-40 mL Intracatheter Q12H Danford, Earl Lites, MD   10 mL at 08/07/18 0937  . sodium chloride flush  (NS) 0.9 % injection 10-40 mL  10-40 mL Intracatheter PRN Alberteen Sam, MD   10 mL at 08/03/18 1111  . sodium chloride flush (NS) 0.9 % injection 3 mL  3 mL Intravenous Q12H Emokpae, Courage, MD   3 mL  at 08/07/18 0937  . sodium chloride flush (NS) 0.9 % injection 3 mL  3 mL Intravenous PRN Emokpae, Courage, MD   3 mL at 08/03/18 1000  . traZODone (DESYREL) tablet 50 mg  50 mg Oral QHS PRN Shon Hale, MD        Lab Results:  Results for orders placed or performed during the hospital encounter of 07/30/18 (from the past 48 hour(s))  CBC     Status: None   Collection Time: 08/06/18  7:02 PM  Result Value Ref Range   WBC 7.1 4.0 - 10.5 K/uL   RBC 4.50 3.87 - 5.11 MIL/uL   Hemoglobin 12.9 12.0 - 15.0 g/dL   HCT 10.0 71.2 - 19.7 %   MCV 87.6 80.0 - 100.0 fL   MCH 28.7 26.0 - 34.0 pg   MCHC 32.7 30.0 - 36.0 g/dL   RDW 58.8 32.5 - 49.8 %   Platelets 340 150 - 400 K/uL   nRBC 0.0 0.0 - 0.2 %    Comment: Performed at [facility], [address], [place], [place] [address]  Basic metabolic panel     Status: Abnormal   Collection Time: 08/06/18  7:02 PM  Result Value Ref Range   Sodium 140 135 - 145 mmol/L   Potassium 3.3 (L) 3.5 - 5.1 mmol/L   Chloride 103 98 - 111 mmol/L   CO2 23 22 - 32 mmol/L   Glucose, Bld 82 70 - 99 mg/dL   BUN 11 6 - 20 mg/dL   Creatinine, Ser 8.30 0.44 - 1.00 mg/dL   Calcium 8.8 (L) 8.9 - 10.3 mg/dL   GFR calc non Af Amer >60 >60 mL/min   GFR calc Af Amer >60 >60 mL/min   Anion gap 14 5 - 15    Comment: Performed at [facility], [address], [place], [place] [address]    Blood Alcohol level:  Lab Results  Component Value Date   [facility] <10 07/30/2018   ETH <5 11/08/2016    Musculoskeletal: Strength & Muscle Tone: Generalized weakness and right arm rigidity. Gait & Station: UTA since patient is lying in bed. Patient leans: N/A  Psychiatric Specialty Exam: Physical Exam  Nursing note and vitals reviewed. Constitutional:  She is oriented to person, place, and time. She appears well-developed and well-nourished.  HENT:  Head: Normocephalic and atraumatic.  Neck: Normal range of motion.  Respiratory: Effort normal.  Musculoskeletal: Normal range of motion.  Neurological: She is alert and oriented to person, place, and time.  Psychiatric: Judgment and thought content normal. Her affect is blunt. Her speech is delayed. She is slowed. Cognition and memory are normal.    Review of Systems  Psychiatric/Behavioral: Negative for depression, hallucinations and suicidal ideas.  All other systems reviewed and are negative.   Blood pressure (!) 167/89, pulse 99, temperature 98.6 F (37 C), temperature source Axillary, resp. rate 18, weight 77.1 kg, SpO2 99 %.Body mass index is 24.04 kg/m.  General Appearance: Disheveled, middle aged, African American female, wearing a hospital gown with unbrushed hair and lying in bed. NAD.   Eye Contact:  Fair  Speech:  Slow  Volume:  Decreased  Mood:  "I'm not depressed."  Affect:  Blunt  Thought Process:  Goal Directed  Orientation:  Full (Time, Place, and Person)  Thought Content:  Logical  Suicidal Thoughts:  No  Homicidal Thoughts:  No  Memory:  Immediate;   Fair Recent;   Fair Remote;  Fair  Judgement:  Poor  Insight:  Poor  Psychomotor Activity:  Psychomotor Retardation  Concentration:  Concentration: Fair and Attention Span: Fair  Recall:  [employer] of Knowledge:  Fair  Language:  Fair  Akathisia:  No  Handed:  Right  AIMS (if indicated):   N/A  Assets:  Housing Intimacy Social Support  ADL's:  Impaired  Cognition:  Improved today since given patient Ativan for suspected catatonia.   Sleep:   Fair    Assessment:  Calyse Murcia is a 58 y.o. female who was admitted with altered mental status in the setting of poor medication compliance and possible UTI. Her presentation is thought to be secondary to psychotic decompensation due to stopping her  psychotropic medications versus catatonia due to having worsening symptoms of mutism, withdrawal, staring/poor eye contact and immobility for the past 3 days. Patient has reportedly improved with IV Ativan today per primary team. Recommend continuing Ativan for catatonic symptoms. If patient continues to improve then may not require ECT for catatonia.   Treatment Plan Summary: -Continue Prozac 20 mg daily for mood.  -Continue Zyprexa 5 mg daily for psychosis. -Continue IV Ativan 1 mg TID for catatonic symptoms.  -EKG reviewed and QTc 463 on 5/4. Please closely monitor when starting or increasing QTc prolonging agents.  -Psychiatry will follow patient as clinically indicated.    Cherly Beach, DO 08/07/2018, 3:25 PM   Addendum: Spoke to primary team. Patient initially may have had a small response to IV Ativan yesterday although she has had 3 doses over the past 24 hours and is somnolent and still is not eating or drinking today. Patient may benefit from ECT for catatonic symptoms. Have contacted psychiatrist at [facility] to see if there is a possibility to have patient transferred there for treatment.   Juanetta Beets, DO 08/08/18 3:29 PM

## 2018-08-07 NOTE — Consult Note (Signed)
Psychiatry, [facility]: I was asked to look at this patient's chart because of a question about transferring her to our psychiatric unit.  I have reviewed the chart and spoke with the nurse on duty today.  Patient has not been able to ambulate or use the bathroom on her own for the last couple days, according to nursing she eats very little and only with much coaching and is largely noncompliant with oral medication.  For all of these reasons we would not be able to manage her on our psychiatric ward at this time.  However, I would offer my suggestion that the patient would probably be a good candidate for ECT treatment.  If we were going to try to do ECT she would need to be on a medical ward for the time being because of the reasons mentioned above until she improves in her self-care function.  As far as I know we have done this only one time previously.  My suggestion is that you reconsult psychiatry at [facility].  They can speak with the family and find out if the family would be willing to give consent for ECT.  If the family would give consent then we could try to negotiate a transfer from your medical unit to our medical unit.  This would require the involvement of our hospitalists and our hospital agreeing to the medical admission.  If the current treatment team and consulting psychiatrist would like to try to pursue this please let me know.

## 2018-08-07 NOTE — Progress Notes (Signed)
PROGRESS NOTE    Amy Casey  ZOX:[id number] DOB: 09/28/1960 DOA: 07/30/2018 PCP: Jac Canavan, PA-C      Brief Narrative:  Amy Casey is a 58 y.o. F with Bipolar disorder, HTN, who presents with altered mental status.  Per her husband she had 3 days of altered mentation, poor appetite, she quit taking her psychiatric medications.  Increased urinary frequency.  Her initial physical examination blood pressure was 151/61, pulse rate 72, temperature 98.5, respiratory 27, oxygen saturation 96%.  Her lungs were clear to auscultation bilaterally, heart S1-S2 present and rhythmic, abdomen soft, no lower extremity edema.  Urinalysis had 6-10 white cells, 0-5 red cells.  Chest x-ray negative for infiltrates, CT head negative for acute changes.   Patient was admitted to the hospital with a working diagnosis of acute toxic/ metabolic encephalopathy.    Assessment & Plan:  Acute psychosis Paranoid schizophrenia Patient admitted and initially thought to be somnolent/altered due to UTI, compounded by dehydration and hypokalemia.  She was treated with empiric Ceftriaxone, IV fluids and aggressive K supplementation without improvement.  Urine culture negative, doubt infection was every present.  Subsequently evaluated by Psychiatry, who felt her presentation was consistent with previous psychotic episodes for which she had been evaluated by our Psychiatry department.    Started on IV haldol.  Psychiatry recommend inpatietn hospitalization.  Patient has been IVC'd.  Initially after restarting antipsychotic, was taking food and PO medicines.  However, in last 2 days, has refused all food, and today refusing PO medicines.    -Trial lorazepam 1 mg TID for catatonia -Psychiatry are also considering ECT   -Consult psychiatry, appreciate cares -Continue olanzapine and fluoxetine    AKI Resolved  Hypokalemia Magnesium normal, potassium repleted.  Hypertension BP elevated again,  refusing medicines -Continue amlodipine -Continue HCTZ -Continue PRN labetalol and hydralazine  SARS-CoV-2 testing was obtained for screening purposes.  COVID ruled out.      MDM and disposition: The below labs and imaging reports were reviewed and summarized above.  Medication management as above.  For the last several days, the patient has been catatonic, makes eye contact, then stares ahead, responds not at all to any verbal stimuli, does not take food, does not move independently.  After 1 dose of IV lorazepam today, the patient was able to state weakly that she is at "[facility]" and the year is "2020".  She still does not move, but this appears to be a slight improvement.  If lorazepam two more doses improves her status, will continue, if only partially like this (only minimally talking) will increase dose to 2 mg TID.   Psychiatry involved.    DVT prophylaxis: Lovenox Code Status: FULL Family Communication: Husband by phone    Consultants:   None  Procedures:   None  Antimicrobials:   Ceftriaxone    Subjective: No new fever, respiratory distress, vomiting, diarrhea  Objective: Vitals:   08/06/18 1532 08/06/18 2251 08/07/18 0600 08/07/18 1039  BP: (!) 154/82 (!) 150/77 (!) 187/97 (!) 167/89  Pulse: 94 93 (!) 108 99  Resp: 19  18   Temp: 97.6 F (36.4 C) 98.4 F (36.9 C) 98.6 F (37 C)   TempSrc: Oral Oral Axillary   SpO2:  94% 99%   Weight:        Intake/Output Summary (Last 24 hours) at 08/07/2018 1940 Last data filed at 08/07/2018 1727 Gross per 24 hour  Intake 1000 ml  Output 500 ml  Net 500 ml   [employer]  07/30/18 0925  Weight: 77.1 kg    Examination: General appearance: Thin adult female, lying in bed, catatonic, avoids eye contact HEENT: Anicteric, conjunctival pink, lids and lashes normal, no nasal vomiting, discharge, or epistaxis, dentition poor, no oral lesions.  . Skin: No suspicious rashes or lesions. Cardiac: Giller rate  and rhythm, no murmurs, no lower extremity edema Respiratory: Respiratory rate is normal, lungs clear without rales or wheezes. Abdomen: Abdomen soft without tenderness to palpation, no hepatosplenomegaly or distention. MSK: No deformities or effusions of the large joints of the upper lower extremities bilaterally, diffuse loss subcutaneous muscle mass and fat. Neuro: Awake, briefly makes eye contact and stares ahead, does not respond to verbal stimuli, resists movement of the arms and legs. Psych: Very flat.      Data Reviewed: I have personally reviewed following labs and imaging studies:  CBC: Recent Labs  Lab 08/02/18 0328 08/03/18 0501 08/04/18 0242 08/06/18 1902  WBC 5.3 5.8 8.4 7.1  HGB 13.3 14.2 13.6 12.9  HCT 40.5 43.7 41.6 39.4  MCV 88.2 89.2 88.1 87.6  PLT 332 326 264 340   Basic Metabolic Panel: Recent Labs  Lab 08/01/18 0246 08/02/18 0328 08/03/18 0501 08/04/18 0242 08/06/18 1902  NA 141 141 142 139 140  K 2.5* 2.7* 4.0 3.6 3.3*  CL 100 105 104 105 103  CO2 29 27 26 22 23   GLUCOSE 136* 126* 126* 105* 82  BUN 14 <5* <5* 8 11  CREATININE 0.87 0.80 0.72 0.94 0.77  CALCIUM 8.7* 8.7* 9.2 9.2 8.8*   GFR: CrCl cannot be calculated (Unknown ideal weight.). Liver Function Tests: No results for input(s): AST, ALT, ALKPHOS, BILITOT, PROT, ALBUMIN in the last 168 hours. No results for input(s): LIPASE, AMYLASE in the last 168 hours. No results for input(s): AMMONIA in the last 168 hours. Coagulation Profile: No results for input(s): INR, PROTIME in the last 168 hours. Cardiac Enzymes: No results for input(s): CKTOTAL, CKMB, CKMBINDEX, TROPONINI in the last 168 hours. BNP (last 3 results) No results for input(s): PROBNP in the last 8760 hours. HbA1C: No results for input(s): HGBA1C in the last 72 hours. CBG: No results for input(s): GLUCAP in the last 168 hours. Lipid Profile: No results for input(s): CHOL, HDL, LDLCALC, TRIG, CHOLHDL, LDLDIRECT in the last  72 hours. Thyroid Function Tests: No results for input(s): TSH, T4TOTAL, FREET4, T3FREE, THYROIDAB in the last 72 hours. Anemia Panel: No results for input(s): VITAMINB12, FOLATE, FERRITIN, TIBC, IRON, RETICCTPCT in the last 72 hours. Urine analysis:    Component Value Date/Time   COLORURINE AMBER (A) 07/30/2018 1117   APPEARANCEUR HAZY (A) 07/30/2018 1117   LABSPEC 1.019 07/30/2018 1117   PHURINE 5.0 07/30/2018 1117   GLUCOSEU NEGATIVE 07/30/2018 1117   HGBUR NEGATIVE 07/30/2018 1117   BILIRUBINUR NEGATIVE 07/30/2018 1117   BILIRUBINUR n 06/02/2016 1251   KETONESUR 5 (A) 07/30/2018 1117   PROTEINUR 30 (A) 07/30/2018 1117   UROBILINOGEN negative 06/02/2016 1251   UROBILINOGEN 1.0 02/19/2014 1849   NITRITE NEGATIVE 07/30/2018 1117   LEUKOCYTESUR LARGE (A) 07/30/2018 1117   Sepsis Labs: @LABRCNTIP (procalcitonin:4,lacticacidven:4)  ) Recent Results (from the past 240 hour(s))  SARS Coronavirus 2 [facility](Hospital order, [facility])     Status: None   Collection Time: 07/30/18 11:11 AM  Result Value Ref Range Status   SARS Coronavirus 2 NEGATIVE NEGATIVE Final    Comment: (NOTE) If result is NEGATIVE SARS-CoV-2 target nucleic acids are NOT DETECTED. The SARS-CoV-2 RNA is  generally detectable in upper and lower  respiratory specimens during the acute phase of infection. The lowest  concentration of SARS-CoV-2 viral copies this assay can detect is 250  copies / mL. A negative result does not preclude SARS-CoV-2 infection  and should not be used as the sole basis for treatment or other  patient management decisions.  A negative result may occur with  improper specimen collection / handling, submission of specimen other  than nasopharyngeal swab, presence of viral mutation(s) within the  areas targeted by this assay, and inadequate number of viral copies  (<250 copies / mL). A negative result must be combined with clinical  observations, patient history, and  epidemiological information. If result is POSITIVE SARS-CoV-2 target nucleic acids are DETECTED. The SARS-CoV-2 RNA is generally detectable in upper and lower  respiratory specimens dur ing the acute phase of infection.  Positive  results are indicative of active infection with SARS-CoV-2.  Clinical  correlation with patient history and other diagnostic information is  necessary to determine patient infection status.  Positive results do  not rule out bacterial infection or co-infection with other viruses. If result is PRESUMPTIVE POSTIVE SARS-CoV-2 nucleic acids MAY BE PRESENT.   A presumptive positive result was obtained on the submitted specimen  and confirmed on repeat testing.  While 2019 novel coronavirus  (SARS-CoV-2) nucleic acids may be present in the submitted sample  additional confirmatory testing may be necessary for epidemiological  and / or clinical management purposes  to differentiate between  SARS-CoV-2 and other Sarbecovirus currently known to infect humans.  If clinically indicated additional testing with an alternate test  methodology [phone]) is advised. The SARS-CoV-2 RNA is generally  detectable in upper and lower respiratory sp ecimens during the acute  phase of infection. The expected result is Negative. Fact Sheet for Patients:  BoilerBrush.com.cy Fact Sheet for Healthcare Providers: https://pope.com/ This test is not yet approved or cleared by the [place] FDA and has been authorized for detection and/or diagnosis of SARS-CoV-2 by FDA under an Emergency Use Authorization (EUA).  This EUA will remain in effect (meaning this test can be used) for the duration of the COVID-19 declaration under Section 564(b)(1) of the Act, 21 U.S.C. section 360bbb-3(b)(1), unless the authorization is terminated or revoked sooner. Performed at [facility], [address], [place], [place] [address]   Urine Culture      Status: Abnormal   Collection Time: 07/30/18 11:17 AM  Result Value Ref Range Status   Specimen Description URINE, RANDOM  Final   Special Requests   Final    NONE Performed at [facility], [address] [place], [place] [address]    Culture MULTIPLE SPECIES PRESENT, SUGGEST RECOLLECTION (A)  Final   Report Status 07/31/2018 FINAL  Final  MRSA PCR Screening     Status: None   Collection Time: 07/30/18  6:12 PM  Result Value Ref Range Status   MRSA by PCR NEGATIVE NEGATIVE Final    Comment:        The GeneXpert MRSA Assay (FDA approved for NASAL specimens only), is one component of a comprehensive MRSA colonization surveillance program. It is not intended to diagnose MRSA infection nor to guide or monitor treatment for MRSA infections. Performed at [facility], [address], [place], [place] [address]   Culture, Urine     Status: None   Collection Time: 08/01/18  3:00 PM  Result Value Ref Range Status   Specimen  Description URINE, RANDOM  Final   Special Requests NONE  Final   Culture   Final    NO GROWTH Performed at [facility], [address], [place], [place] [address]    Report Status 08/02/2018 FINAL  Final         Radiology Studies: No results found.      Scheduled Meds:  amLODipine  5 mg Oral Daily   enoxaparin (LOVENOX) injection  40 mg Subcutaneous Q24H   FLUoxetine  20 mg Oral Daily   hydrochlorothiazide  12.5 mg Oral Daily   LORazepam  1 mg Intravenous Q8H   OLANZapine  5 mg Oral Daily   sodium chloride flush  10-40 mL Intracatheter Q12H   sodium chloride flush  3 mL Intravenous Q12H   Continuous Infusions:  sodium chloride       LOS: 7 days    Time spent: 25 minutes    Alberteen Sam, MD [facility] 08/07/2018, 7:40 PM     Please page through AMION:  [email or web address] If 7PM-7AM, please contact night-coverage

## 2018-08-08 DIAGNOSIS — Z9119 Patient's noncompliance with other medical treatment and regimen: Secondary | ICD-10-CM

## 2018-08-08 LAB — BASIC METABOLIC PANEL
Anion gap: 13 (ref 5–15)
BUN: 12 mg/dL (ref 6–20)
CO2: 23 mmol/L (ref 22–32)
Calcium: 8.9 mg/dL (ref 8.9–10.3)
Chloride: 103 mmol/L (ref 98–111)
Creatinine, Ser: 0.78 mg/dL (ref 0.44–1.00)
GFR calc Af Amer: >60 mL/min (ref >60)
GFR calc non Af Amer: >60 mL/min (ref >60)
Glucose, Bld: 86 mg/dL (ref 70–99)
Potassium: 3.5 mmol/L (ref 3.5–5.1)
Sodium: 139 mmol/L (ref 135–145)

## 2018-08-08 MED ORDER — PRO-STAT SUGAR FREE PO LIQD
30.0000 mL | Freq: Two times a day (BID) | ORAL | Status: DC
  Filled 2018-08-08: qty 30

## 2018-08-08 MED ORDER — ENSURE ENLIVE PO LIQD
237.0000 mL | Freq: Three times a day (TID) | ORAL | Status: DC
  Administered 2018-08-08: 237 mL via ORAL

## 2018-08-08 MED ORDER — ADULT MULTIVITAMIN W/MINERALS CH
1.0000 | ORAL_TABLET | Freq: Every day | ORAL | Status: DC
  Administered 2018-08-08: 1 via ORAL
  Filled 2018-08-08 (×2): qty 1

## 2018-08-08 MED ORDER — POTASSIUM CL IN DEXTROSE 5% 20 MEQ/L IV SOLN
20.0000 meq | INTRAVENOUS | Status: DC
  Administered 2018-08-08 – 2018-08-09 (×2): 20 meq via INTRAVENOUS
  Filled 2018-08-08 (×3): qty 1000

## 2018-08-08 NOTE — Progress Notes (Signed)
Initial Nutrition Assessment  DOCUMENTATION CODES:   Not applicable  INTERVENTION:   -MVI with minerals daily -30 ml Prostat BID, each supplement provides 100 kcals and 15 grams protein -Ensure Enlive po TID, each supplement provides 350 kcal and 20 grams of protein -If pt remains with poor oral intake, may need consider initiation of short term nutrition support  NUTRITION DIAGNOSIS:   Inadequate oral intake related to acute illness(catatonia) as evidenced by meal completion < 25%.  GOAL:   Patient will meet greater than or equal to 90% of their needs  MONITOR:   PO intake, Supplement acceptance, Labs, Weight trends, Skin, I & O's  REASON FOR ASSESSMENT:   LOS    ASSESSMENT:    Amy Casey  is a 58 y.o. female with past medical history relevant for hypertension, depressive disorder, history of recurrent UTI and h/o schizophrenia who presents with a 3-day history of altered mentation from home....  Pt admitted with acute toxic metabolic encephalopathy.  5/8- IVC by magistrate  Reviewed I/O's: -50 ml x 24 hours and +12 L since admission  UOP: 1.1 L x 24 hours  Per H&P, pt with poor appetite and stopped taking her psychiatric medications 3 days PTA.  Pt unable to participate in interview or provide accurate history.   Wt hx reveals pt wt has been stable over the past year.   Per psychiatry notes, consider ECT for catanoia if improving. Pt may require inpatient psychiatric hospitalization.   Pt with poor oral intake during hospitalization. Per nursing notes, pt often refusing meds and foods, even with a lot of coaching. Meal completion documented at 0%. If pt continues with refusal of food and meds, may need to consider short term enteral access.   Labs reviewed.   NUTRITION - FOCUSED PHYSICAL EXAM:    Most Recent Value  Orbital Region  Unable to assess  Upper Arm Region  Unable to assess  Thoracic and Lumbar Region  Unable to assess  Buccal Region  Unable  to assess  Temple Region  Unable to assess  Clavicle Bone Region  Unable to assess  Clavicle and Acromion Bone Region  Unable to assess  Scapular Bone Region  Unable to assess  Dorsal Hand  Unable to assess  Patellar Region  Unable to assess  Anterior Thigh Region  Unable to assess  Posterior Calf Region  Unable to assess  Edema (RD Assessment)  Unable to assess  Hair  Unable to assess  Eyes  Unable to assess  Mouth  Unable to assess  Skin  Unable to assess  Nails  Unable to assess       Diet Order:   Diet Order            Diet regular Room service appropriate? Yes; Fluid consistency: Thin  Diet effective now              EDUCATION NEEDS:   Not appropriate for education at this time  Skin:  Skin Assessment: Reviewed RN Assessment  Last BM:  Unknown  Height:   Ht Readings from Last 1 Encounters:  07/19/17 5' 10.5" (1.791 m)    Weight:   Wt Readings from Last 1 Encounters:  07/30/18 77.1 kg    Ideal Body Weight:  74.1 kg  BMI:  Body mass index is 24.04 kg/m.  Estimated Nutritional Needs:   Kcal:  1900-2100  Protein:  90-105 grams  Fluid:  1.9-2.1 L    Glenyce Randle A. Mayford Knife, RD, LDN, CDCES Registered Dietitian  II Certified Diabetes Care and Education Specialist Pager: [phone] After hours Pager: [phone]

## 2018-08-08 NOTE — Progress Notes (Signed)
CSW renewed IVC and [employer] will serve patient. Paperwork on shadow chart.   Osborne Casco Asher Torpey LCSW [phone]

## 2018-08-08 NOTE — Progress Notes (Addendum)
PROGRESS NOTE        PATIENT DETAILS Name: Amy Casey Age: 58 y.o. Sex: female Date of Birth: 05-09-1960 Admit Date: 07/30/2018 Admitting Physician Courage Mariea Clonts, MD XKG:[id number], Kermit Balo, PA-C  Brief Narrative: Patient is a 58 y.o. female with past medical history of paranoid schizophrenia, HTN-presented with altered mental status, initially altered mental status was thought to be secondary to complicated UTI/dehydration-however upon further observation of her clinical course-it is now felt that altered mental status is secondary to psychotic decompensation versus catatonia due to noncompliance to psychotropic medications.    Subjective: Lying comfortably in bed-lethargic-but arouses to a strong verbal stimuli- speaks very softly in 1 word sentences.  Per bedside sitter-hardly any oral intake this morning.  Per nursing staff-still refusing oral medications-including her psych meds.  Assessment/Plan: Paranoid schizophrenia with psychotic decompensation versus catatonic state: Remains lethargic-speaks softly in one-word sentences.  Still refuses psychotropic medications.  Minimal oral intake per RN.  Encourage use of Prozac and Zyprexa for now.  Restart gentle IV fluid hydration as really no significant oral intake.  Will attempt to get in touch with Dr. Jennell Corner see if patient requires further medical management-or need to consider ECT.  Not felt to have any infectious etiology including UTI at this time-remains off antimicrobial therapy.  Patient is under involuntary commitment.  AKI: Resolved  Hypokalemia: Repleted  Hypertension: Controlled-continue with amlodipine and HCTZ.  Deconditioning/debility: Probably due to acute illness-psychiatric decompensation-PT evaluation ordered.  DVT Prophylaxis: Prophylactic Lovenox   Code Status: Full code   Family Communication: None at bedside  Disposition Plan: Remain inpatient-clearly not ready  for discharge-needs to be further stabilized-needs further inpatient management/optimization of psychotropic medications before consideration of discharge.  May need electroconvulsive therapy if psychotropic medications are ineffective.  Furthermore-has very minimal oral intake and needs IV fluid hydration at this point   Antimicrobial agents: Anti-infectives (From admission, onward)   Start     Dose/Rate Route Frequency Ordered Stop   07/31/18 1200  cefTRIAXone (ROCEPHIN) 1 g in sodium chloride 0.9 % 100 mL IVPB  Status:  Discontinued     1 g 200 mL/hr over 30 Minutes Intravenous Every 24 hours 07/30/18 1807 08/03/18 1522   07/30/18 1215  cefTRIAXone (ROCEPHIN) 1 g in sodium chloride 0.9 % 100 mL IVPB     1 g 200 mL/hr over 30 Minutes Intravenous  Once 07/30/18 1211 07/30/18 1309   07/30/18 1200  cephALEXin (KEFLEX) capsule 500 mg  Status:  Discontinued     500 mg Oral  Once 07/30/18 1158 07/30/18 1211      Procedures: None  CONSULTS:  psychiatry  Time spent: 25- minutes-Greater than 50% of this time was spent in counseling, explanation of diagnosis, planning of further management, and coordination of care.  MEDICATIONS: Scheduled Meds: . amLODipine  5 mg Oral Daily  . enoxaparin (LOVENOX) injection  40 mg Subcutaneous Q24H  . FLUoxetine  20 mg Oral Daily  . hydrochlorothiazide  12.5 mg Oral Daily  . OLANZapine  5 mg Oral Daily  . sodium chloride flush  10-40 mL Intracatheter Q12H  . sodium chloride flush  3 mL Intravenous Q12H   Continuous Infusions: . sodium chloride     PRN Meds:.sodium chloride, acetaminophen **OR** acetaminophen, albuterol, hydrALAZINE, labetalol, ondansetron **OR** ondansetron (ZOFRAN) IV, polyethylene glycol, sodium chloride flush, sodium chloride flush, traZODone   PHYSICAL EXAM: Vital  signs: Vitals:   08/07/18 2208 08/08/18 0500 08/08/18 0746 08/08/18 0845  BP: (!) 162/87 (!) 172/101 (!) 185/90 (!) 163/87  Pulse: (!) 104 (!) 113 (!) 110 93   Resp:  20 18   Temp: 97.8 F (36.6 C) 99.6 F (37.6 C) 98.8 F (37.1 C)   TempSrc: Oral Axillary Oral   SpO2: 99% 95% 100% 100%  Weight:       Filed Weights   07/30/18 0925  Weight: 77.1 kg   Body mass index is 24.04 kg/m.   General appearance: Lethargic-blank stare at times-very flat affect.  Not in any distress. HEENT: Atraumatic and Normocephalic Neck: supple Resp:Good air entry bilaterally, no added sounds  CVS: S1 S2 regular, no murmurs.  GI: Bowel sounds present, Non tender and not distended with no gaurding, rigidity or rebound.No organomegaly Extremities: No edema Neurology: Very difficult exam-but moves all 4 extremities-although has generalized weakness. Musculoskeletal:No digital cyanosis Skin:No Rash, warm and dry Wounds:N/A  I have personally reviewed following labs and imaging studies  LABORATORY DATA: CBC: Recent Labs  Lab 08/02/18 0328 08/03/18 0501 08/04/18 0242 08/06/18 1902  WBC 5.3 5.8 8.4 7.1  HGB 13.3 14.2 13.6 12.9  HCT 40.5 43.7 41.6 39.4  MCV 88.2 89.2 88.1 87.6  PLT 332 326 264 340    Basic Metabolic Panel: Recent Labs  Lab 08/02/18 0328 08/03/18 0501 08/04/18 0242 08/06/18 1902 08/08/18 0306  NA 141 142 139 140 139  K 2.7* 4.0 3.6 3.3* 3.5  CL 105 104 105 103 103  CO2 GLUCOSE 126* 126* 105* 82 86  BUN <5* <5* CREATININE 0.80 0.72 0.94 0.77 0.78  CALCIUM 8.7* 9.2 9.2 8.8* 8.9    GFR: CrCl cannot be calculated (Unknown ideal weight.).  Liver Function Tests: No results for input(s): AST, ALT, ALKPHOS, BILITOT, PROT, ALBUMIN in the last 168 hours. No results for input(s): LIPASE, AMYLASE in the last 168 hours. No results for input(s): AMMONIA in the last 168 hours.  Coagulation Profile: No results for input(s): INR, PROTIME in the last 168 hours.  Cardiac Enzymes: No results for input(s): CKTOTAL, CKMB, CKMBINDEX, TROPONINI in the last 168 hours.  BNP (last 3 results) No results for  input(s): PROBNP in the last 8760 hours.  HbA1C: No results for input(s): HGBA1C in the last 72 hours.  CBG: No results for input(s): GLUCAP in the last 168 hours.  Lipid Profile: No results for input(s): CHOL, HDL, LDLCALC, TRIG, CHOLHDL, LDLDIRECT in the last 72 hours.  Thyroid Function Tests: No results for input(s): TSH, T4TOTAL, FREET4, T3FREE, THYROIDAB in the last 72 hours.  Anemia Panel: No results for input(s): VITAMINB12, FOLATE, FERRITIN, TIBC, IRON, RETICCTPCT in the last 72 hours.  Urine analysis:    Component Value Date/Time   COLORURINE AMBER (A) 07/30/2018 1117   APPEARANCEUR HAZY (A) 07/30/2018 1117   LABSPEC 1.019 07/30/2018 1117   PHURINE 5.0 07/30/2018 1117   GLUCOSEU NEGATIVE 07/30/2018 1117   HGBUR NEGATIVE 07/30/2018 1117   BILIRUBINUR NEGATIVE 07/30/2018 1117   BILIRUBINUR n 06/02/2016 1251   KETONESUR 5 (A) 07/30/2018 1117   PROTEINUR 30 (A) 07/30/2018 1117   UROBILINOGEN negative 06/02/2016 1251   UROBILINOGEN 1.0 02/19/2014 1849   NITRITE NEGATIVE 07/30/2018 1117   LEUKOCYTESUR LARGE (A) 07/30/2018 1117    Sepsis Labs: Lactic Acid, Venous    Component Value Date/Time   LATICACIDVEN 1.30 01/20/2018 1517    MICROBIOLOGY: Recent Results (from the past  240 hour(s))  SARS Coronavirus 2 [facility](Hospital order, Performed in [facility] hospital lab)     Status: None   Collection Time: 07/30/18 11:11 AM  Result Value Ref Range Status   SARS Coronavirus 2 NEGATIVE NEGATIVE Final    Comment: (NOTE) If result is NEGATIVE SARS-CoV-2 target nucleic acids are NOT DETECTED. The SARS-CoV-2 RNA is generally detectable in upper and lower  respiratory specimens during the acute phase of infection. The lowest  concentration of SARS-CoV-2 viral copies this assay can detect is 250  copies / mL. A negative result does not preclude SARS-CoV-2 infection  and should not be used as the sole basis for treatment or other  patient management decisions.  A negative  result may occur with  improper specimen collection / handling, submission of specimen other  than nasopharyngeal swab, presence of viral mutation(s) within the  areas targeted by this assay, and inadequate number of viral copies  (<250 copies / mL). A negative result must be combined with clinical  observations, patient history, and epidemiological information. If result is POSITIVE SARS-CoV-2 target nucleic acids are DETECTED. The SARS-CoV-2 RNA is generally detectable in upper and lower  respiratory specimens dur ing the acute phase of infection.  Positive  results are indicative of active infection with SARS-CoV-2.  Clinical  correlation with patient history and other diagnostic information is  necessary to determine patient infection status.  Positive results do  not rule out bacterial infection or co-infection with other viruses. If result is PRESUMPTIVE POSTIVE SARS-CoV-2 nucleic acids MAY BE PRESENT.   A presumptive positive result was obtained on the submitted specimen  and confirmed on repeat testing.  While 2019 novel coronavirus  (SARS-CoV-2) nucleic acids may be present in the submitted sample  additional confirmatory testing may be necessary for epidemiological  and / or clinical management purposes  to differentiate between  SARS-CoV-2 and other Sarbecovirus currently known to infect humans.  If clinically indicated additional testing with an alternate test  methodology [phone] is advised. The SARS-CoV-2 RNA is generally  detectable in upper and lower respiratory sp ecimens during the acute  phase of infection. The expected result is Negative. Fact Sheet for Patients:  BoilerBrush.com.cyhttps://www.fda.gov/media/136312/download Fact Sheet for Healthcare Providers: https://pope.com/https://www.fda.gov/media/136313/download This test is not yet approved or cleared by the [place] FDA and has been authorized for detection and/or diagnosis of SARS-CoV-2 by FDA under an Emergency Use Authorization  (EUA).  This EUA will remain in effect (meaning this test can be used) for the duration of the COVID-19 declaration under Section 564(b)(1) of the Act, 21 U.S.C. section 360bbb-3(b)(1), unless the authorization is terminated or revoked sooner. Performed at [facility], [address] [place], [place] [address]   Urine Culture     Status: Abnormal   Collection Time: 07/30/18 11:17 AM  Result Value Ref Range Status   Specimen Description URINE, RANDOM  Final   Special Requests   Final    NONE Performed at [facility], [address] [place], [place] [address]    Culture MULTIPLE SPECIES PRESENT, SUGGEST RECOLLECTION (A)  Final   Report Status 07/31/2018 FINAL  Final  MRSA PCR Screening     Status: None   Collection Time: 07/30/18  6:12 PM  Result Value Ref Range Status   MRSA by PCR NEGATIVE NEGATIVE Final    Comment:        The GeneXpert MRSA Assay (FDA approved for NASAL specimens only), is one component of a comprehensive  MRSA colonization surveillance program. It is not intended to diagnose MRSA infection nor to guide or monitor treatment for MRSA infections. Performed at [name], [address], [place], [place] [address]   Culture, Urine     Status: None   Collection Time: 08/01/18  3:00 PM  Result Value Ref Range Status   Specimen Description URINE, RANDOM  Final   Special Requests NONE  Final   Culture   Final    NO GROWTH Performed at [facility], [address], [place], [place] [address]    Report Status 08/02/2018 FINAL  Final    RADIOLOGY STUDIES/RESULTS: Ct Head Wo Contrast  Result Date: 07/30/2018 CLINICAL DATA:  Altered mental status. EXAM: CT HEAD WITHOUT CONTRAST TECHNIQUE: Contiguous axial images were obtained from the base of the skull through the vertex without intravenous contrast. COMPARISON:  CT scan dated 02/19/2014 MRI dated 02/21/2014 FINDINGS: Brain: There scattered areas of bilateral periventricular white  matter lucency slightly progressed since the prior exams. There is no acute hemorrhage or mass lesion. Mild cerebral cortical atrophy. Vascular: No hyperdense vessel or unexpected calcification. Skull: Normal. Negative for fracture or focal lesion. Sinuses/Orbits: Normal. Other: None. IMPRESSION: No acute abnormality. Chronic slightly progressed moderate white matter disease likely chronic small vessel ischemic changes. Electronically Signed   By: Francene Boyers M.D.   On: 07/30/2018 12:12   Dg Chest Port 1 View  Result Date: 07/30/2018 CLINICAL DATA:  Altered mental status 3 days. History of schizophrenia not taking medication recently. EXAM: PORTABLE CHEST 1 VIEW COMPARISON:  11/09/2016 FINDINGS: Lungs are adequately inflated and otherwise clear. Cardiomediastinal silhouette and remainder of the exam is unchanged. IMPRESSION: No active disease. Electronically Signed   By: Elberta Fortis M.D.   On: 07/30/2018 11:26     LOS: 8 days   Jeoffrey Massed, MD  [facility]  If 7PM-7AM, please contact night-coverage  Please page via [email or web address]  Go to [email or web address] and use [facility] universal password to access. If you do not have the password, please contact the hospital operator.  Locate the [facility] provider you are looking for under [facility] and page to a number that you can be directly reached. If you still have difficulty reaching the provider, please page the [facility] (Director on Call) for the Hospitalists listed on amion for assistance.  08/08/2018, 2:06 PM

## 2018-08-08 NOTE — Evaluation (Signed)
Physical Therapy Evaluation Patient Details Name: Amy Casey MRN: [id number] DOB: 1960-05-31 Today's Date: 08/08/2018   History of Present Illness  Amy Casey is a 58 y.o. F with Bipolar disorder, HTN, who presents with altered mental status, Paranoid schizophrenia with psychotic decompensation versus catatonic state  Clinical Impression   Pt admitted with above diagnosis. Pt currently with functional limitations due to the deficits listed below (see PT Problem List). Not entirely sure of baseline function, but per chart review, relatively independent with mobility until this recent decline; presents with generalized weakness, decr functional mobility, decr cognition; Have ordered OT per protocol, would like to see if 2 person assist for initiation and safety will help with walking and ADLs;  Pt will benefit from skilled PT to increase their independence and safety with mobility to allow discharge to the venue listed below.       Follow Up Recommendations SNF;Supervision/Assistance - 24 hour; physical rehabilitation is indicated for Amy Casey -- still her psych needs  Outweigh -- can she get consistent PT and OT in an inpt psych unit?    Equipment Recommendations  Rolling walker with 5" wheels;3in1 (PT)    Recommendations for Other Services OT consult(ordered per protocol)     Precautions / Restrictions Precautions Precautions: Fall      Mobility  Bed Mobility Overal bed mobility: Needs Assistance Bed Mobility: Rolling;Sidelying to Sit;Sit to Supine Rolling: Max assist Sidelying to sit: Max assist   Sit to supine: Max assist   General bed mobility comments: Max assist to roll, and get to sitting due to decr initiation; used bed pad to cradle hips and move trunk; once in sitting, good, stable balance  Transfers Overall transfer level: Needs assistance Equipment used: Rolling walker (2 wheeled) Transfers: Sit to/from Stand Sit to Stand: Mod assist;+2 physical  assistance;+2 safety/equipment         General transfer comment: Mod assist to power up to stand; dependent on UE support for steadiness   Ambulation/Gait             General Gait Details: Attempted to step or march in place, however pt did not step with mild facilitation of weight shifts  Stairs            Wheelchair Mobility    Modified Rankin (Stroke Patients Only)       Balance Overall balance assessment: Needs assistance   Sitting balance-Leahy Scale: Good       Standing balance-Leahy Scale: Poor                               Pertinent Vitals/Pain Pain Assessment: Faces Faces Pain Scale: No hurt    Home Living Family/patient expects to be discharged to:: Unsure                 Additional Comments: Considering Inpatient Psych options for pt    Prior Function Level of Independence: Independent(prior to her decline)         Comments: Unable to get much information from pt     Hand Dominance        Extremity/Trunk Assessment   Upper Extremity Assessment Upper Extremity Assessment: Generalized weakness    Lower Extremity Assessment Lower Extremity Assessment: Generalized weakness       Communication   Communication: Expressive difficulties(not talking; said one sentence during PT session)  Cognition Arousal/Alertness: Awake/alert Behavior During Therapy: Flat affect Overall Cognitive Status: Difficult to assess Area of  Impairment: Safety/judgement;Following commands;Problem solving                       Following Commands: Follows one step commands inconsistently Safety/Judgement: Decreased awareness of safety;Decreased awareness of deficits   Problem Solving: Slow processing;Decreased initiation;Difficulty sequencing;Requires verbal cues;Requires tactile cues General Comments: made one statement during PT session: "I don't need a walker"      General Comments General comments (skin integrity, edema,  etc.): BP 177/100 in sitting; Nurse tech informed me she runs high    Exercises     Assessment/Plan    PT Assessment Patient needs continued PT services  PT Problem List Decreased strength;Decreased range of motion;Decreased activity tolerance;Decreased balance;Decreased mobility;Decreased coordination;Decreased cognition;Decreased knowledge of use of DME;Decreased safety awareness;Decreased knowledge of precautions       PT Treatment Interventions DME instruction;Gait training;Functional mobility training;Therapeutic activities;Therapeutic exercise;Balance training;Neuromuscular re-education;Cognitive remediation;Patient/family education    PT Goals (Current goals can be found in the Care Plan section)  Acute Rehab PT Goals Patient Stated Goal: did not state PT Goal Formulation: Patient unable to participate in goal setting Time For Goal Achievement: 08/22/18 Potential to Achieve Goals: Good    Frequency Min 2X/week   Barriers to discharge        Co-evaluation               AM-PAC PT "6 Clicks" Mobility  Outcome Measure Help needed turning from your back to your side while in a flat bed without using bedrails?: A Lot Help needed moving from lying on your back to sitting on the side of a flat bed without using bedrails?: A Lot Help needed moving to and from a bed to a chair (including a wheelchair)?: A Lot Help needed standing up from a chair using your arms (e.g., wheelchair or bedside chair)?: A Lot Help needed to walk in hospital room?: A Lot Help needed climbing 3-5 steps with a railing? : Total 6 Click Score: 11    End of Session Equipment Utilized During Treatment: Gait belt Activity Tolerance: Patient tolerated treatment well Patient left: in bed;with call bell/phone within reach;with nursing/sitter in room;Other (comment)(bed in semi-chair position) Nurse Communication: Mobility status PT Visit Diagnosis: Unsteadiness on feet (R26.81);Other abnormalities of  gait and mobility (R26.89);Muscle weakness (generalized) (M62.81);Difficulty in walking, not elsewhere classified (R26.2)    Time: 1455-1510 PT Time Calculation (min) (ACUTE ONLY): 15 min   Charges:   PT Evaluation $PT Eval Moderate Complexity: 1 Mod          Van Clines, [place]  Acute Rehabilitation Services Pager [phone] Office [phone]   Levi Aland 08/08/2018, 4:55 PM

## 2018-08-09 ENCOUNTER — Inpatient Hospital Stay
Admission: AD | Admit: 2018-08-09 | Discharge: 2018-08-15 | DRG: 885 | Disposition: A | Discharge status: Home / Self Care | Payer: BLUE CROSS/BLUE SHIELD | Source: Other Acute Inpatient Hospital | Attending: Specialist | Admitting: Specialist

## 2018-08-09 DIAGNOSIS — F202 Catatonic schizophrenia: Secondary | ICD-10-CM | POA: Diagnosis not present

## 2018-08-09 DIAGNOSIS — F200 Paranoid schizophrenia: Principal | ICD-10-CM | POA: Diagnosis present

## 2018-08-09 DIAGNOSIS — R5381 Other malaise: Secondary | ICD-10-CM | POA: Diagnosis not present

## 2018-08-09 DIAGNOSIS — Z882 Allergy status to sulfonamides status: Secondary | ICD-10-CM

## 2018-08-09 DIAGNOSIS — I10 Essential (primary) hypertension: Secondary | ICD-10-CM | POA: Diagnosis not present

## 2018-08-09 DIAGNOSIS — F4310 Post-traumatic stress disorder, unspecified: Secondary | ICD-10-CM | POA: Diagnosis not present

## 2018-08-09 DIAGNOSIS — F22 Delusional disorders: Secondary | ICD-10-CM | POA: Diagnosis not present

## 2018-08-09 DIAGNOSIS — Z818 Family history of other mental and behavioral disorders: Secondary | ICD-10-CM | POA: Diagnosis not present

## 2018-08-09 DIAGNOSIS — Z888 Allergy status to other drugs, medicaments and biological substances status: Secondary | ICD-10-CM

## 2018-08-09 DIAGNOSIS — R110 Nausea: Secondary | ICD-10-CM | POA: Diagnosis not present

## 2018-08-09 DIAGNOSIS — Z79899 Other long term (current) drug therapy: Secondary | ICD-10-CM

## 2018-08-09 DIAGNOSIS — R634 Abnormal weight loss: Secondary | ICD-10-CM | POA: Diagnosis present

## 2018-08-09 DIAGNOSIS — Z9119 Patient's noncompliance with other medical treatment and regimen: Secondary | ICD-10-CM | POA: Diagnosis not present

## 2018-08-09 DIAGNOSIS — R531 Weakness: Secondary | ICD-10-CM | POA: Diagnosis present

## 2018-08-09 DIAGNOSIS — F329 Major depressive disorder, single episode, unspecified: Secondary | ICD-10-CM | POA: Diagnosis not present

## 2018-08-09 DIAGNOSIS — F333 Major depressive disorder, recurrent, severe with psychotic symptoms: Secondary | ICD-10-CM | POA: Diagnosis not present

## 2018-08-09 DIAGNOSIS — R3915 Urgency of urination: Secondary | ICD-10-CM | POA: Diagnosis present

## 2018-08-09 LAB — MAGNESIUM: Magnesium: 2 mg/dL (ref 1.7–2.4)

## 2018-08-09 LAB — CBC
HCT: 40 % (ref 36.0–46.0)
Hemoglobin: 13.1 g/dL (ref 12.0–15.0)
MCH: 28.6 pg (ref 26.0–34.0)
MCHC: 32.8 g/dL (ref 30.0–36.0)
MCV: 87.3 fL (ref 80.0–100.0)
Platelets: 377 10*3/uL (ref 150–400)
RBC: 4.58 MIL/uL (ref 3.87–5.11)
RDW: 12.1 % (ref 11.5–15.5)
WBC: 6.4 10*3/uL (ref 4.0–10.5)
nRBC: 0 % (ref 0.0–0.2)

## 2018-08-09 LAB — BASIC METABOLIC PANEL
Anion gap: 10 (ref 5–15)
BUN: 18 mg/dL (ref 6–20)
CO2: 25 mmol/L (ref 22–32)
Calcium: 9.4 mg/dL (ref 8.9–10.3)
Chloride: 103 mmol/L (ref 98–111)
Creatinine, Ser: 0.98 mg/dL (ref 0.44–1.00)
GFR calc Af Amer: >60 mL/min (ref >60)
GFR calc non Af Amer: >60 mL/min (ref >60)
Glucose, Bld: 117 mg/dL — ABNORMAL HIGH (ref 70–99)
Potassium: 3.7 mmol/L (ref 3.5–5.1)
Sodium: 138 mmol/L (ref 135–145)

## 2018-08-09 MED ORDER — FLUOXETINE HCL 40 MG PO CAPS: 20.0000 mg | ORAL_CAPSULE | Freq: Every day | ORAL | 0 refills | Status: DC

## 2018-08-09 MED ORDER — OLANZAPINE 10 MG PO TABS: 5.0000 mg | ORAL_TABLET | Freq: Every day | ORAL | Status: DC

## 2018-08-09 MED ORDER — ACETAMINOPHEN 325 MG PO TABS: 650.0000 mg | ORAL_TABLET | Freq: Four times a day (QID) | ORAL | Status: DC | PRN

## 2018-08-09 MED ORDER — AMLODIPINE BESYLATE 5 MG PO TABS
5.0000 mg | ORAL_TABLET | Freq: Every day | ORAL | Status: DC
  Administered 2018-08-10 – 2018-08-15 (×6): 5 mg via ORAL
  Filled 2018-08-09 (×6): qty 1

## 2018-08-09 MED ORDER — ONDANSETRON HCL 4 MG PO TABS: 4.0000 mg | ORAL_TABLET | Freq: Four times a day (QID) | ORAL | Status: DC | PRN

## 2018-08-09 MED ORDER — ENOXAPARIN SODIUM 40 MG/0.4ML ~~LOC~~ SOLN
40.0000 mg | SUBCUTANEOUS | Status: DC
  Administered 2018-08-09 – 2018-08-11 (×3): 40 mg via SUBCUTANEOUS
  Filled 2018-08-09 (×4): qty 0.4

## 2018-08-09 MED ORDER — ONDANSETRON HCL 4 MG/2ML IJ SOLN: 4.0000 mg | Freq: Four times a day (QID) | INTRAMUSCULAR | Status: DC | PRN

## 2018-08-09 MED ORDER — ACETAMINOPHEN 650 MG RE SUPP: 650.0000 mg | Freq: Four times a day (QID) | RECTAL | Status: DC | PRN

## 2018-08-09 NOTE — Evaluation (Signed)
Occupational Therapy Evaluation Patient Details Name: Amy Casey MRN: [id number] DOB: 1961/02/16 Today's Date: 08/09/2018    History of Present Illness Amy Casey is a 58 y.o. 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 Pt performing bed mobility with maxA for supine to sit with bed pad for use. Pt initiating small movements with BLEs. Pt with fair balance at EOB seated. Pt maxA for stand pivot transfer taking a few small steps to recliner and refused use of Rw. Pt could progress with OT and condition in guarded as pt following some commands. OT to follow acutely. SNF recommendation.    Follow Up Recommendations  SNF;Supervision/Assistance - 24 hour    Equipment Recommendations  Other (comment)(to be determined)    Recommendations for Other Services       Precautions / Restrictions Precautions Precautions: Fall Restrictions Weight Bearing Restrictions: No      Mobility Bed Mobility Overal bed mobility: Needs Assistance Bed Mobility: Rolling;Sidelying to Sit;Sit to Supine Rolling: Max assist Sidelying to sit: Max assist       General bed mobility comments: stability with use of bed pads  Transfers Overall transfer level: Needs assistance Equipment used: Rolling walker (2 wheeled) Transfers: Sit to/from Stand Sit to Stand: Mod assist;+2 physical assistance;+2 safety/equipment         General transfer comment: Mod assist to power up to stand; dependent on UE support for steadiness     Balance Overall balance assessment: Needs assistance   Sitting balance-Leahy Scale: Good       Standing balance-Leahy Scale: Poor                             ADL  either performed or assessed with clinical judgement   ADL Overall ADL's : Needs assistance/impaired Eating/Feeding: Minimal assistance;Sitting   Grooming: Minimal assistance;Sitting   Upper Body Bathing: Moderate assistance;Sitting   Lower Body Bathing: Maximal assistance;Sitting/lateral leans;Sit to/from stand   Upper Body Dressing : Moderate assistance;Sitting   Lower Body Dressing: Maximal assistance;Sitting/lateral leans   Toilet Transfer: Maximal assistance;Stand-pivot   Toileting- Clothing Manipulation and Hygiene: Moderate assistance;+2 for physical assistance;Sitting/lateral lean;Sit to/from stand       Functional mobility during ADLs: Maximal assistance(Pt pivots feet and refuses use of RW) General ADL Comments: UB ADL modA and LB ADL max A     Vision Baseline Vision/History: No visual deficits Patient Visual Report: No change from baseline Vision Assessment?: No apparent visual deficits Additional Comments: has a blank stare     Perception     Praxis      Pertinent Vitals/Pain Pain Assessment: Faces Faces Pain Scale: No hurt     Hand Dominance Right   Extremity/Trunk Assessment Upper Extremity Assessment Upper Extremity Assessment: Generalized weakness   Lower Extremity Assessment Lower Extremity Assessment: Generalized weakness   Cervical / Trunk Assessment Cervical / Trunk Assessment: Normal   Communication Communication Communication: Expressive difficulties(not talking; said one sentence during PT session)   Cognition Arousal/Alertness: Awake/alert Behavior During Therapy: Flat affect Overall Cognitive Status: Difficult to assess Area of Impairment: Safety/judgement;Following commands;Problem solving  Following Commands: Follows one step commands inconsistently Safety/Judgement: Decreased awareness of safety;Decreased awareness of deficits   Problem Solving: Slow processing;Decreased initiation;Difficulty  sequencing;Requires verbal cues;Requires tactile cues General Comments: made one statement during PT session: "I don't need that belt." (gait belt)   General Comments  pt guided through process of bed rolling to sitting EOB and pt was not resistant at this time.    Exercises     Shoulder Instructions      Home Living Family/patient expects to be discharged to:: Unsure                                 Additional Comments: Considering Inpatient Psych options for pt      Prior Functioning/Environment Level of Independence: Independent(prior to her decline)        Comments: Unable to get much information from pt        OT Problem List: Decreased strength;Decreased activity tolerance;Impaired balance (sitting and/or standing);Decreased coordination;Decreased safety awareness;Pain      OT Treatment/Interventions: Self-care/ADL training;Therapeutic exercise;Neuromuscular education;Energy conservation;Therapeutic activities;Patient/family education;Balance training    OT Goals(Current goals can be found in the care plan section) Acute Rehab OT Goals Patient Stated Goal: did not state OT Goal Formulation: With patient Time For Goal Achievement: 08/23/18 Potential to Achieve Goals: Good ADL Goals Pt Will Perform Grooming: with modified independence Pt Will Transfer to Toilet: with supervision;bedside commode Additional ADL Goal #1: Pt will perform ADL task sitting EOB x5 mins with set-upA and fair balance. Additional ADL Goal #2: Pt will perform ADL functional mobility with RW and minguardA  OT Frequency: Min 2X/week   Barriers to D/C:            Co-evaluation              AM-PAC OT "6 Clicks" Daily Activity     Outcome Measure Help from another person eating meals?: A Little Help from another person taking care of personal grooming?: A Little Help from another person toileting, which includes using toliet, bedpan, or urinal?: A Lot Help from another  person bathing (including washing, rinsing, drying)?: A Lot Help from another person to put on and taking off regular upper body clothing?: A Lot Help from another person to put on and taking off regular lower body clothing?: A Lot 6 Click Score: 14   End of Session Equipment Utilized During Treatment: Gait belt;Rolling walker Nurse Communication: Mobility status  Activity Tolerance: Patient tolerated treatment well;Treatment limited secondary to medical complications (Comment) Patient left: in chair;with call bell/phone within reach;with chair alarm set  OT Visit Diagnosis: Unsteadiness on feet (R26.81);Muscle weakness (generalized) (M62.81)                Time: [id number] OT Time Calculation (min): 26 min Charges:  OT General Charges $OT Visit: 1 Visit OT Evaluation $OT Eval Moderate Complexity: 1 Mod OT Treatments $Self Care/Home Management : 8-22 mins  Revonda StandardAllison Cecil Cranker(Jelenek) [place] OTR/L Acute Rehabilitation Services Pager: [phone] Office: [phone]   Lonzo CloudLLISON J Jovie Swanner 08/09/2018, 1:48 PM

## 2018-08-09 NOTE — Progress Notes (Signed)
Patient continues to have poor PO intake and refusing medications. MD aware. Report called to Kirt Boys, RN at [facility].

## 2018-08-09 NOTE — Progress Notes (Signed)
PROGRESS NOTE        PATIENT DETAILS Name: Amy Casey Age: 58 y.o. Sex: female Date of Birth: [birth year] Admit Date: 07/30/2018 Admitting Physician Courage Mariea Clonts, MD ZOX:[id number], Kermit Balo, PA-C  Brief Narrative: Patient is a 58 y.o. female with past medical history of paranoid schizophrenia, HTN-presented with altered mental status, initially altered mental status was thought to be secondary to complicated UTI/dehydration-however upon further observation of her clinical course-it is now felt that altered mental status is secondary to psychotic decompensation versus catatonia due to noncompliance to psychotropic medications.    Subjective: Speaks in only one-word sentences-very hard to discern-very soft voice.  Flat affect-at times attempts to answer-at times with blank stare.  Per nursing staff-refused psychotropic medications-hardly any oral intake.    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  Still refuses psychotropic medications, hardly any oral intake.  Did start IV fluids yesterday-since refusing oral medications/hardly any oral intake-we will see if we can insert the NG tube and start feedings.  Discussed with psychiatry MD Dr. Sharma Covert yesterday-since no significant response to scheduled IV Ativan for 1 whole day-recommends we transfer to [facility] for ECT.  Electronic chat message with psych MD at [facility] completed earlier this morning-he needs to talk with the patient's husband and then with the hospitalist team at [facility] before we can transfer.  Awaiting callback from Dr. Toni Amend.  Hopefully once the NG tube is inserted-we can resume/give her her psychiatric medications consistently.Not felt to have any infectious etiology including UTI at this time-remains off  antimicrobial therapy.  Patient is under involuntary commitment.  AKI: Resolved  Hypokalemia: Repleted   Hypertension: Controlled-continue with amlodipine and HCTZ.  Deconditioning/debility: Probably due to acute illness-psychiatric decompensation-PT evaluation appreciated  DVT Prophylaxis: Prophylactic Lovenox   Code Status: Full code   Family Communication: Had spoken with spouse over the phone on 5/13.  Will await word from psychiatric MD at [facility] before updating spouse today.  Disposition Plan: Remain inpatient-clearly not ready for discharge-needs to be further stabilized-needs further inpatient management/optimization of psychotropic medications before consideration of discharge.  May need electroconvulsive therapy if psychotropic medications are ineffective.  Furthermore-has very minimal oral intake and needs IV fluid hydration at this point   Antimicrobial agents: Anti-infectives (From admission, onward)   Start     Dose/Rate Route Frequency Ordered Stop   07/31/18 1200  cefTRIAXone (ROCEPHIN) 1 g in sodium chloride 0.9 % 100 mL IVPB  Status:  Discontinued     1 g 200 mL/hr over 30 Minutes Intravenous Every 24 hours 07/30/18 1807 08/03/18 1522   07/30/18 1215  cefTRIAXone (ROCEPHIN) 1 g in sodium chloride 0.9 % 100 mL IVPB     1 g 200 mL/hr over 30 Minutes Intravenous  Once 07/30/18 1211 07/30/18 1309   07/30/18 1200  cephALEXin (KEFLEX) capsule 500 mg  Status:  Discontinued     500 mg Oral  Once 07/30/18 1158 07/30/18 1211      Procedures: None  CONSULTS:  psychiatry  Time spent: 25- minutes-Greater than 50% of this time was spent in counseling, explanation of diagnosis, planning of further management, and coordination of care.  MEDICATIONS: Scheduled Meds: . amLODipine  5 mg Oral Daily  . enoxaparin (LOVENOX) injection  40 mg Subcutaneous Q24H  . feeding supplement (ENSURE ENLIVE)  237 mL Oral TID BM  . feeding supplement (PRO-STAT SUGAR FREE 64)  30 mL Oral  BID WC  . FLUoxetine  20 mg Oral Daily  . hydrochlorothiazide  12.5 mg Oral Daily  . multivitamin with minerals  1 tablet Oral Daily  . OLANZapine  5 mg Oral Daily  . sodium chloride flush  10-40 mL Intracatheter Q12H   Continuous Infusions: . dextrose 5 % with KCl 20 mEq / L 20 mEq (08/09/18 1100)   PRN Meds:.acetaminophen **OR** acetaminophen, albuterol, hydrALAZINE, labetalol, ondansetron **OR** ondansetron (ZOFRAN) IV, polyethylene glycol, sodium chloride flush, traZODone   PHYSICAL EXAM: Vital signs: Vitals:   08/08/18 1406 08/08/18 2245 08/09/18 0454 08/09/18 1407  BP: (!) 145/92 128/81 131/87 (!) 120/96  Pulse: (!) 102 87 85 (!) 108  Resp: Temp:   98.4 F (36.9 C) 98 F (36.7 C)  TempSrc:   Axillary Oral  SpO2: 100% 98% 99% 100%  Weight:       Filed Weights   07/30/18 0925  Weight: 77.1 kg   Body mass index is 24.04 kg/m.   General appearance: Awake but very lethargic.  Speaks very softly at times.  Very flat affect. Eyes:no scleral icterus. HEENT: Atraumatic and Normocephalic Neck: supple, no JVD. Resp:Good air entry bilaterally,no rales or rhonchi CVS: S1 S2 regular, no murmurs.  GI: Bowel sounds present, Non tender and not distended with no gaurding, rigidity or rebound. Extremities: B/L Lower Ext shows no edema, both legs are warm to touch Neurology:  Non focal but has significant amount of generalized weakness. Musculoskeletal:No digital cyanosis Skin:No Rash, warm and dry Wounds:N/A  I have personally reviewed following labs and imaging studies  LABORATORY DATA: CBC: Recent Labs  Lab 08/03/18 0501 08/04/18 0242 08/06/18 1902  WBC 5.8 8.4 7.1  HGB 14.2 13.6 12.9  HCT 43.7 41.6 39.4  MCV 89.2 88.1 87.6  PLT 326 264 340    Basic Metabolic Panel: Recent Labs  Lab 08/03/18 0501 08/04/18 0242 08/06/18 1902 08/08/18 0306 08/09/18 0235  NA 142 139 140 139 138  K 4.0 3.6 3.3* 3.5 3.7  CL 104 105 103 103 103  CO2 GLUCOSE 126* 105* 82 86 117*  BUN <5* CREATININE 0.72 0.94 0.77 0.78 0.98  CALCIUM 9.2 9.2 8.8* 8.9 9.4  MG  --   --   --   --  2.0    GFR: CrCl cannot be calculated (Unknown ideal weight.).  Liver Function Tests: No results for input(s): AST, ALT, ALKPHOS, BILITOT, PROT, ALBUMIN in the last 168 hours. No results for input(s): LIPASE, AMYLASE in the last 168 hours. No results for input(s): AMMONIA in the last 168 hours.  Coagulation Profile: No results for input(s): INR, PROTIME in the last 168 hours.  Cardiac Enzymes: No results for input(s): CKTOTAL, CKMB, CKMBINDEX, TROPONINI in the last 168 hours.  BNP (last 3 results) No results for input(s): PROBNP in the last 8760 hours.  HbA1C: No results for input(s): HGBA1C in the last 72 hours.  CBG: No results for input(s): GLUCAP in the last 168 hours.  Lipid Profile: No results for input(s): CHOL, HDL, LDLCALC, TRIG, CHOLHDL, LDLDIRECT in the last 72 hours.  Thyroid Function Tests: No results for input(s): TSH, T4TOTAL, FREET4, T3FREE, THYROIDAB in the last 72 hours.  Anemia Panel: No results for input(s): VITAMINB12, FOLATE, FERRITIN,  TIBC, IRON, RETICCTPCT in the last 72 hours.  Urine analysis:    Component Value Date/Time   COLORURINE AMBER (A) 07/30/2018 1117   APPEARANCEUR HAZY (A) 07/30/2018 1117   LABSPEC 1.019 07/30/2018 1117   PHURINE 5.0 07/30/2018 1117   GLUCOSEU NEGATIVE 07/30/2018 1117   HGBUR NEGATIVE 07/30/2018 1117   BILIRUBINUR NEGATIVE 07/30/2018 1117   BILIRUBINUR n 06/02/2016 1251   KETONESUR 5 (A) 07/30/2018 1117   PROTEINUR 30 (A) 07/30/2018 1117   UROBILINOGEN negative 06/02/2016 1251   UROBILINOGEN 1.0 02/19/2014 1849   NITRITE NEGATIVE 07/30/2018 1117   LEUKOCYTESUR LARGE (A) 07/30/2018 1117    Sepsis Labs: Lactic Acid, Venous    Component Value Date/Time   LATICACIDVEN 1.30 01/20/2018 1517    MICROBIOLOGY: Recent Results (from the past 240 hour(s))  MRSA PCR  Screening     Status: None   Collection Time: 07/30/18  6:12 PM  Result Value Ref Range Status   MRSA by PCR NEGATIVE NEGATIVE Final    Comment:        The GeneXpert MRSA Assay (FDA approved for NASAL specimens only), is one component of a comprehensive MRSA colonization surveillance program. It is not intended to diagnose MRSA infection nor to guide or monitor treatment for MRSA infections. Performed at [facility], [address] [place], [place] [address]   Culture, Urine     Status: None   Collection Time: 08/01/18  3:00 PM  Result Value Ref Range Status   Specimen Description URINE, RANDOM  Final   Special Requests NONE  Final   Culture   Final    NO GROWTH Performed at [facility], [address] [place], [place] [address]    Report Status 08/02/2018 FINAL  Final    RADIOLOGY STUDIES/RESULTS: Ct Head Wo Contrast  Result Date: 07/30/2018 CLINICAL DATA:  Altered mental status. EXAM: CT HEAD WITHOUT CONTRAST TECHNIQUE: Contiguous axial images were obtained from the base of the skull through the vertex without intravenous contrast. COMPARISON:  CT scan dated 02/19/2014 MRI dated 02/21/2014 FINDINGS: Brain: There scattered areas of bilateral periventricular white matter lucency slightly progressed since the prior exams. There is no acute hemorrhage or mass lesion. Mild cerebral cortical atrophy. Vascular: No hyperdense vessel or unexpected calcification. Skull: Normal. Negative for fracture or focal lesion. Sinuses/Orbits: Normal. Other: None. IMPRESSION: No acute abnormality. Chronic slightly progressed moderate white matter disease likely chronic small vessel ischemic changes. Electronically Signed   By: Francene BoyersJames  Maxwell M.D.   On: 07/30/2018 12:12   Dg Chest Port 1 View  Result Date: 07/30/2018 CLINICAL DATA:  Altered mental status 3 days. History of schizophrenia not taking medication recently. EXAM: PORTABLE CHEST 1 VIEW COMPARISON:  11/09/2016 FINDINGS: Lungs are  adequately inflated and otherwise clear. Cardiomediastinal silhouette and remainder of the exam is unchanged. IMPRESSION: No active disease. Electronically Signed   By: Elberta Fortisaniel  Boyle M.D.   On: 07/30/2018 11:26     LOS: 9 days   Jeoffrey MassedShanker , MD  [facility]  If 7PM-7AM, please contact night-coverage  Please page via [email or web address]  Go to [email or web address] and use [facility] universal password to access. If you do not have the password, please contact the hospital operator.  Locate the [facility] provider you are looking for under [facility] and page to a number that you can be directly reached. If you still have difficulty reaching the provider, please page the [facility] (Director on Call) for the Hospitalists listed on amion for assistance.  08/09/2018, 2:25 PM

## 2018-08-09 NOTE — Discharge Summary (Signed)
PATIENT DETAILS Name: Amy Casey Age: 58 y.o. Sex: female Date of Birth: April 29, 1960 MRN: [id number]. Admitting Physician: Shon Hale, MD MVH:[id number], Kermit Balo, PA-C  Admit Date: 07/30/2018 Discharge date: 08/09/2018  Recommendations for Outpatient Follow-up:  1. Being transferred to [facility] for ECT  Admitted From:  Home  Disposition: [facility]   Home Health: No  Equipment/Devices: None  Discharge Condition: Stable  CODE STATUS: FULL CODE  Diet recommendation:  Heart Healthy / Carb Modified / Regular / Dysphagia 1/2/3 with full aspiration precautions  Brief Summary: See H&P, Labs, Consult and Test reports for all details in brief,Patient is a 58 y.o. female with past medical history of paranoid schizophrenia, HTN-presented with altered mental status, initially altered mental status was thought to be secondary to complicated UTI/dehydration-however upon further observation of her clinical course-it is now felt that altered mental status is secondary to psychotic decompensation versus catatonia due to noncompliance to psychotropic medications.   Brief Hospital Course: Paranoid schizophrenia with psychotic decompensation versus catatonic state:  Unfortunately even with supportive care-she remains essentially the same-very weak-lethargic-speaking in one-word sentences-very hard to discern what she is saying.  She unfortunately still refuses psychotropic medications.  Patient was followed closely by the psychiatry service, after much discussion with Dr. Sharma Covert and Dr. Sydell Axon are to transfer patient to [facility] for ECT.  Dr. Sidney Ace MD at [facility] has already spoken with the patient's spouse over the phone-he consents to transfer and to ECT.  Due to very poor oral intake-she remains on IV fluids, plans were to place a Cortak tube for NG feedings on 5/15-but this can be done at [facility] as well.Not felt to have any infectious etiology including UTI at this time-remains off  antimicrobial therapy.  Patient is under involuntary commitment.  AKI: Resolved  Hypokalemia: Repleted   Hypertension: Controlled-continue with amlodipine and HCTZ.  Deconditioning/debility: Probably due to acute illness-psychiatric decompensation-PT evaluation appreciated-may require rehab services on discharge.  DVT Prophylaxis: Prophylactic Lovenox   Code Status: Full code   Family Communication: Had spoken with spouse over the phone on 5/13.    Disposition Plan: Transfer to [facility] for ECT.  Procedures/Studies: None  Discharge Diagnoses:  Principal Problem:   Paranoid schizophrenia (HCC) Active Problems:   Noncompliance   Acute lower UTI   Hypokalemia   Acute metabolic encephalopathy   Encephalopathy   Discharge Instructions:  Activity:  As tolerated with Full fall precautions use walker/cane & assistance as needed   Discharge Instructions    Diet - low sodium heart healthy   Complete by:  As directed    Increase activity slowly   Complete by:  As directed      Allergies as of 08/09/2018      Reactions   Paliperidone Other (See Comments)   Angioedema, drooling, slurred speech, ptosis   Sulfa Antibiotics Other (See Comments)   unknown   Lisinopril Rash   Angioedema      Medication List    STOP taking these medications   benztropine 0.5 MG tablet Commonly known as:  COGENTIN     TAKE these medications   amLODipine 5 MG tablet Commonly known as:  NORVASC Take 1 tablet (5 mg total) by mouth daily.   FLUoxetine 40 MG capsule Commonly known as:  PROZAC Take 1 capsule (40 mg total) by mouth daily. What changed:  Another medication with the same name was removed. Continue taking this medication, and follow the directions you see here.   OLANZapine 10 MG tablet Commonly known as:  ZYPREXA Take 0.5 tablets (5 mg total) by mouth at bedtime. What changed:  how much to take      Follow-up Information    Tysinger, Kermit Balo, PA-C. Schedule  an appointment as soon as possible for a visit in 1 week(s).   Specialty:  Family Medicine Contact information: [address] [place] [address] [phone]          Allergies  Allergen Reactions   Paliperidone Other (See Comments)    Angioedema, drooling, slurred speech, ptosis   Sulfa Antibiotics Other (See Comments)    unknown   Lisinopril Rash    Angioedema     Consultations:   psychiatry   Other Procedures/Studies: Ct Head Wo Contrast  Result Date: 07/30/2018 CLINICAL DATA:  Altered mental status. EXAM: CT HEAD WITHOUT CONTRAST TECHNIQUE: Contiguous axial images were obtained from the base of the skull through the vertex without intravenous contrast. COMPARISON:  CT scan dated 02/19/2014 MRI dated 02/21/2014 FINDINGS: Brain: There scattered areas of bilateral periventricular white matter lucency slightly progressed since the prior exams. There is no acute hemorrhage or mass lesion. Mild cerebral cortical atrophy. Vascular: No hyperdense vessel or unexpected calcification. Skull: Normal. Negative for fracture or focal lesion. Sinuses/Orbits: Normal. Other: None. IMPRESSION: No acute abnormality. Chronic slightly progressed moderate white matter disease likely chronic small vessel ischemic changes. Electronically Signed   By: Francene Boyers M.D.   On: 07/30/2018 12:12   Dg Chest Port 1 View  Result Date: 07/30/2018 CLINICAL DATA:  Altered mental status 3 days. History of schizophrenia not taking medication recently. EXAM: PORTABLE CHEST 1 VIEW COMPARISON:  11/09/2016 FINDINGS: Lungs are adequately inflated and otherwise clear. Cardiomediastinal silhouette and remainder of the exam is unchanged. IMPRESSION: No active disease. Electronically Signed   By: Elberta Fortis M.D.   On: 07/30/2018 11:26      TODAY-DAY OF DISCHARGE:  Subjective:   [name] today he weak and debilitated.  She is only speaking in 1 word sentences-her voice is very soft and hard to  discern.  Objective:   Blood pressure (!) 120/96, pulse (!) 108, temperature 98 F (36.7 C), temperature source Oral, resp. rate 17, weight 77.1 kg, SpO2 100 %.  Intake/Output Summary (Last 24 hours) at 08/09/2018 1738 Last data filed at 08/09/2018 0900 Gross per 24 hour  Intake 731.7 ml  Output 1050 ml  Net -318.3 ml   Filed Weights   07/30/18 0925  Weight: 77.1 kg    Exam: General appearance: Awake but very lethargic.  Speaks very softly at times.  Very flat affect. Eyes:no scleral icterus. HEENT: Atraumatic and Normocephalic Neck: supple, no JVD. Resp:Good air entry bilaterally,no rales or rhonchi CVS: S1 S2 regular, no murmurs.  GI: Bowel sounds present, Non tender and not distended with no gaurding, rigidity or rebound. Extremities: B/L Lower Ext shows no edema, both legs are warm to touch Neurology:  Non focal but has significant amount of generalized weakness. Musculoskeletal:No digital cyanosis Skin:No Rash, warm and dry Wounds:N/A   PERTINENT RADIOLOGIC STUDIES: Ct Head Wo Contrast  Result Date: 07/30/2018 CLINICAL DATA:  Altered mental status. EXAM: CT HEAD WITHOUT CONTRAST TECHNIQUE: Contiguous axial images were obtained from the base of the skull through the vertex without intravenous contrast. COMPARISON:  CT scan dated 02/19/2014 MRI dated 02/21/2014 FINDINGS: Brain: There scattered areas of bilateral periventricular white matter lucency slightly progressed since the prior exams. There is no acute hemorrhage or mass lesion. Mild cerebral cortical atrophy. Vascular: No hyperdense vessel or unexpected  calcification. Skull: Normal. Negative for fracture or focal lesion. Sinuses/Orbits: Normal. Other: None. IMPRESSION: No acute abnormality. Chronic slightly progressed moderate white matter disease likely chronic small vessel ischemic changes. Electronically Signed   By: Francene BoyersJames  Maxwell M.D.   On: 07/30/2018 12:12   Dg Chest Port 1 View  Result Date: 07/30/2018 CLINICAL  DATA:  Altered mental status 3 days. History of schizophrenia not taking medication recently. EXAM: PORTABLE CHEST 1 VIEW COMPARISON:  11/09/2016 FINDINGS: Lungs are adequately inflated and otherwise clear. Cardiomediastinal silhouette and remainder of the exam is unchanged. IMPRESSION: No active disease. Electronically Signed   By: Elberta Fortisaniel  Boyle M.D.   On: 07/30/2018 11:26     PERTINENT LAB RESULTS: CBC: Recent Labs    08/06/18 1902  WBC 7.1  HGB 12.9  HCT 39.4  PLT 340   CMET CMP     Component Value Date/Time   NA 138 08/09/2018 0235   K 3.7 08/09/2018 0235   CL 103 08/09/2018 0235   CO2 25 08/09/2018 0235   GLUCOSE 117 (H) 08/09/2018 0235   BUN 18 08/09/2018 0235   CREATININE 0.98 08/09/2018 0235   CREATININE 0.76 07/25/2016 0913   CALCIUM 9.4 08/09/2018 0235   PROT 8.0 07/30/2018 0931   ALBUMIN 4.4 07/30/2018 0931   AST 31 07/30/2018 0931   ALT 29 07/30/2018 0931   ALKPHOS 69 07/30/2018 0931   BILITOT 1.0 07/30/2018 0931   GFRNONAA >60 08/09/2018 0235   GFRAA >60 08/09/2018 0235    GFR CrCl cannot be calculated (Unknown ideal weight.). No results for input(s): LIPASE, AMYLASE in the last 72 hours. No results for input(s): CKTOTAL, CKMB, CKMBINDEX, TROPONINI in the last 72 hours. Invalid input(s): POCBNP No results for input(s): DDIMER in the last 72 hours. No results for input(s): HGBA1C in the last 72 hours. No results for input(s): CHOL, HDL, LDLCALC, TRIG, CHOLHDL, LDLDIRECT in the last 72 hours. No results for input(s): TSH, T4TOTAL, T3FREE, THYROIDAB in the last 72 hours.  Invalid input(s): FREET3 No results for input(s): VITAMINB12, FOLATE, FERRITIN, TIBC, IRON, RETICCTPCT in the last 72 hours. Coags: No results for input(s): INR in the last 72 hours.  Invalid input(s): PT Microbiology: Recent Results (from the past 240 hour(s))  MRSA PCR Screening     Status: None   Collection Time: 07/30/18  6:12 PM  Result Value Ref Range Status   MRSA by PCR  NEGATIVE NEGATIVE Final    Comment:        The GeneXpert MRSA Assay (FDA approved for NASAL specimens only), is one component of a comprehensive MRSA colonization surveillance program. It is not intended to diagnose MRSA infection nor to guide or monitor treatment for MRSA infections. Performed at [facility], [address] [place], [place] [address]   Culture, Urine     Status: None   Collection Time: 08/01/18  3:00 PM  Result Value Ref Range Status   Specimen Description URINE, RANDOM  Final   Special Requests NONE  Final   Culture   Final    NO GROWTH Performed at [facility], [address] [place], [place] [address]    Report Status 08/02/2018 FINAL  Final    FURTHER DISCHARGE INSTRUCTIONS:  Get Medicines reviewed and adjusted: Please take all your medications with you for your next visit with your Primary MD  Laboratory/radiological data: Please request your Primary MD to go over all hospital tests and procedure/radiological results at the follow up, please ask your  Primary MD to get all Hospital records sent to his/her office.  In some cases, they will be blood work, cultures and biopsy results pending at the time of your discharge. Please request that your primary care M.D. goes through all the records of your hospital data and follows up on these results.  Also Note the following: If you experience worsening of your admission symptoms, develop shortness of breath, life threatening emergency, suicidal or homicidal thoughts you must seek medical attention immediately by calling 911 or calling your MD immediately  if symptoms less severe.  You must read complete instructions/literature along with all the possible adverse reactions/side effects for all the Medicines you take and that have been prescribed to you. Take any new Medicines after you have completely understood and accpet all the possible adverse reactions/side effects.   Do not drive when  taking Pain medications or sleeping medications (Benzodaizepines)  Do not take more than prescribed Pain, Sleep and Anxiety Medications. It is not advisable to combine anxiety,sleep and pain medications without talking with your primary care practitioner  Special Instructions: If you have smoked or chewed Tobacco  in the last 2 yrs please stop smoking, stop any regular Alcohol  and or any Recreational drug use.  Wear Seat belts while driving.  Please note: You were cared for by a hospitalist during your hospital stay. Once you are discharged, your primary care physician will handle any further medical issues. Please note that NO REFILLS for any discharge medications will be authorized once you are discharged, as it is imperative that you return to your primary care physician (or establish a relationship with a primary care physician if you do not have one) for your post hospital discharge needs so that they can reassess your need for medications and monitor your lab values.  Total Time spent coordinating discharge including counseling, education and face to face time equals 35 minutes.  SignedJeoffrey Massed 08/09/2018 5:38 PM

## 2018-08-09 NOTE — H&P (Signed)
[facility] - [place] at [facility]   PATIENT NAME: Amy Casey    MR#:  [id number]  DATE OF BIRTH:  [birth year]  DATE OF ADMISSION:  08/09/2018  PRIMARY CARE PHYSICIAN: Tysinger, Kermit Balo, PA-C   REQUESTING/REFERRING PHYSICIAN: Clapacs, MD  CHIEF COMPLAINT:  Paranoid Schizophrenia  HISTORY OF PRESENT ILLNESS:  Amy Casey  is a 58 y.o. female who presents with chief complaint as above.  Patient transferred to [facility] medical service from [facility] with plan for likely ECT for her schizophrenia with likely catatonic state.  She is minimally verbal on this writer's interview tonight.  She states only that she is "not doing well."  She provides no other verbal communication.  She will largely follow commands.  Admission orders placed with psychiatry consult  PAST MEDICAL HISTORY:   Past Medical History:  Diagnosis Date  . Depression    Dr. Mila Homer, [facility]; hospitalization 07/2010  . History of echocardiogram 2011   [facility]  . History of renal stone   . Hypertension   . Hypokalemia   . Obesity   . Schizophrenia (HCC)   . Wears glasses      PAST SURGICAL HISTORY:   Past Surgical History:  Procedure Laterality Date  . WISDOM TOOTH EXTRACTION       SOCIAL HISTORY:   Social History   Tobacco Use  . Smoking status: Never Smoker  . Smokeless tobacco: Never Used  Substance Use Topics  . Alcohol use: No     FAMILY HISTORY:   Family History  Problem Relation Age of Onset  . Diabetes Mother   . Kidney disease Mother        dialysis  . Heart disease Mother   . Cancer Maternal Uncle        lung  . Stroke Neg Hx   . Hypertension Neg Hx   . Hyperlipidemia Neg Hx      DRUG ALLERGIES:   Allergies  Allergen Reactions  . Paliperidone Other (See Comments)    Angioedema, drooling, slurred speech, ptosis  . Sulfa Antibiotics Other (See Comments)    unknown  . Lisinopril Rash    Angioedema     MEDICATIONS AT HOME:    Prior to Admission medications   Medication Sig Start Date End Date Taking? Authorizing Provider  amLODipine (NORVASC) 5 MG tablet Take 1 tablet (5 mg total) by mouth daily. 07/25/17 08/24/17  Arrien, York Ram, MD  FLUoxetine (PROZAC) 40 MG capsule Take 1 capsule (40 mg total) by mouth daily. 08/09/18   Ghimire, Werner Lean, MD  OLANZapine (ZYPREXA) 10 MG tablet Take 0.5 tablets (5 mg total) by mouth at bedtime. 08/09/18   Ghimire, Werner Lean, MD    REVIEW OF SYSTEMS:  Review of Systems  Unable to perform ROS: Medical condition     VITAL SIGNS:  There were no vitals filed for this visit. Wt Readings from Last 3 Encounters:  07/30/18 77.1 kg  03/03/18 77.1 kg  07/24/17 82.6 kg    PHYSICAL EXAMINATION:  Physical Exam  Vitals reviewed. Constitutional: She appears well-developed and well-nourished. No distress.  HENT:  Head: Normocephalic and atraumatic.  Mouth/Throat: Oropharynx is clear and moist.  Eyes: Pupils are equal, round, and reactive to light. Conjunctivae and EOM are normal. No scleral icterus.  Neck: Normal range of motion. Neck supple. No JVD present. No thyromegaly present.  Cardiovascular: Normal rate, regular rhythm and intact distal pulses. Exam reveals no gallop and no friction rub.  No  murmur heard. Respiratory: Effort normal and breath sounds normal. No respiratory distress. She has no wheezes. She has no rales.  GI: Soft. Bowel sounds are normal. She exhibits no distension. There is no abdominal tenderness.  Musculoskeletal: Normal range of motion.        General: No edema.     Comments: No arthritis, no gout  Lymphadenopathy:    She has no cervical adenopathy.  Neurological: She is alert. No cranial nerve deficit.  No dysarthria, no aphasia, minimally verbally responsive, follows commands  Skin: Skin is warm and dry. No rash noted. No erythema.  Psychiatric:  Unable to fully assess due to condition    LABORATORY PANEL:   CBC Recent Labs  Lab  08/06/18 1902  WBC 7.1  HGB 12.9  HCT 39.4  PLT 340   ------------------------------------------------------------------------------------------------------------------  Chemistries  Recent Labs  Lab 08/09/18 0235  NA 138  K 3.7  CL 103  CO2 25  GLUCOSE 117*  BUN 18  CREATININE 0.98  CALCIUM 9.4  MG 2.0   ------------------------------------------------------------------------------------------------------------------  Cardiac Enzymes No results for input(s): TROPONINI in the last 168 hours. ------------------------------------------------------------------------------------------------------------------  RADIOLOGY:  No results found.  EKG:   Orders placed or performed during the hospital encounter of 07/30/18  . ED EKG  . ED EKG  . EKG 12-Lead  . EKG 12-Lead  . Repeat EKG  . Repeat EKG  . EKG 12-Lead  . EKG 12-Lead    IMPRESSION AND PLAN:  Principal Problem:   Paranoid schizophrenia (HCC) -with likely catatonic state versus severe psychotic decompensation.  Psychiatry recommended transfer to our facility for likely ECT.  Psychiatry consult placed. Active Problems:   Essential hypertension, benign -continue home meds   Physical deconditioning -patient will likely need to work with physical therapy when she is able, and will likely need a nutrition consult as well  Chart review performed and case discussed with ED provider. Labs, imaging and/or ECG reviewed by provider and discussed with patient/family. Management plans discussed with the patient and/or family.  COVID-19 status: Tested negative     DVT PROPHYLAXIS: SubQ lovenox   GI PROPHYLAXIS:  None  ADMISSION STATUS: Inpatient     CODE STATUS: Full Code Status History    Date Active Date Inactive Code Status Order ID Comments User Context   07/30/2018 1807 08/09/2018 2213 Full Code [id number]  Shon HaleEmokpae, Courage, MD Inpatient   07/19/2017 0742 07/24/2017 1842 Full Code [id number]  Pearson GrippeKim, James, MD ED    07/18/2017 1916 07/19/2017 0624 Full Code [id number]  Money, Gerlene Burdockravis B, FNP Inpatient   07/11/2017 0928 07/18/2017 1318 Full Code [id number]  Cathren LaineSteinl, Kevin, MD ED   11/08/2016 1830 11/11/2016 1337 Full Code [id number]  Lorre NickAllen, Anthony, MD ED   06/16/2015 2144 06/19/2015 1417 Full Code [id number]  Lavera GuiseLiu, Dana Duo, MD ED   02/19/2014 2348 02/23/2014 1843 Full Code [id number]  Duayne CalHoffman, Paul W, NP ED      TOTAL TIME TAKING CARE OF THIS PATIENT: 45 minutes.   This patient was evaluated in the context of the global COVID-19 pandemic, which necessitated consideration that the patient might be at risk for infection with the SARS-CoV-2 virus that causes COVID-19. Institutional protocols and algorithms that pertain to the evaluation of patients at risk for COVID-19 are in a state of rapid change based on information released by regulatory bodies including the CDC and federal and state organizations. These policies and algorithms were followed to the best of this provider's knowledge to date during the patient's care  at this facility.  Barney Drain 08/09/2018, 11:09 PM  [facility]  Office  [phone]  CC: Primary care physician; Jac Canavan, PA-C  Note:  This document was prepared using Dragon voice recognition software and may include unintentional dictation errors.

## 2018-08-10 ENCOUNTER — Other Ambulatory Visit: Payer: Self-pay

## 2018-08-10 ENCOUNTER — Other Ambulatory Visit: Payer: Self-pay | Admitting: Psychiatry

## 2018-08-10 DIAGNOSIS — F200 Paranoid schizophrenia: Principal | ICD-10-CM

## 2018-08-10 LAB — BASIC METABOLIC PANEL
Anion gap: 9 (ref 5–15)
BUN: 22 mg/dL — ABNORMAL HIGH (ref 6–20)
CO2: 26 mmol/L (ref 22–32)
Calcium: 9.2 mg/dL (ref 8.9–10.3)
Chloride: 103 mmol/L (ref 98–111)
Creatinine, Ser: 0.89 mg/dL (ref 0.44–1.00)
GFR calc Af Amer: >60 mL/min (ref >60)
GFR calc non Af Amer: >60 mL/min (ref >60)
Glucose, Bld: 125 mg/dL — ABNORMAL HIGH (ref 70–99)
Potassium: 3.6 mmol/L (ref 3.5–5.1)
Sodium: 138 mmol/L (ref 135–145)

## 2018-08-10 LAB — CBC
HCT: 39.3 % (ref 36.0–46.0)
Hemoglobin: 12.9 g/dL (ref 12.0–15.0)
MCH: 28.7 pg (ref 26.0–34.0)
MCHC: 32.8 g/dL (ref 30.0–36.0)
MCV: 87.3 fL (ref 80.0–100.0)
Platelets: 393 10*3/uL (ref 150–400)
RBC: 4.5 MIL/uL (ref 3.87–5.11)
RDW: 12.2 % (ref 11.5–15.5)
WBC: 6.1 10*3/uL (ref 4.0–10.5)
nRBC: 0 % (ref 0.0–0.2)

## 2018-08-10 LAB — CREATININE, SERUM
Creatinine, Ser: 0.7 mg/dL (ref 0.44–1.00)
GFR calc Af Amer: >60 mL/min (ref >60)
GFR calc non Af Amer: >60 mL/min (ref >60)

## 2018-08-10 MED ORDER — OLANZAPINE 10 MG PO TBDP
10.0000 mg | ORAL_TABLET | Freq: Every day | ORAL | Status: DC
  Administered 2018-08-10 – 2018-08-11 (×2): 10 mg via ORAL
  Filled 2018-08-10 (×3): qty 1

## 2018-08-10 MED ORDER — KCL IN DEXTROSE-NACL 20-5-0.45 MEQ/L-%-% IV SOLN
INTRAVENOUS | Status: DC
  Administered 2018-08-10 – 2018-08-14 (×10): via INTRAVENOUS
  Filled 2018-08-10 (×12): qty 1000

## 2018-08-10 MED ORDER — FLUOXETINE HCL 20 MG PO CAPS
40.0000 mg | ORAL_CAPSULE | Freq: Every day | ORAL | Status: DC
  Administered 2018-08-11 – 2018-08-13 (×3): 40 mg via ORAL
  Filled 2018-08-10 (×4): qty 2

## 2018-08-10 NOTE — Progress Notes (Signed)
Patient confused and unable to answer admission questions.  

## 2018-08-10 NOTE — Consult Note (Signed)
[facility] Face-to-Face Psychiatry Consult   Reason for Consult: Consult to follow-up with this patient who is a 58 year old woman with a history of recurrent psychosis who has been transferred to [place] for probable ECT treatment Referring Physician: Anne Hahn Patient Identification: Amy Casey Warning MRN:  [id number] Principal Diagnosis: Paranoid schizophrenia (HCC) Diagnosis:  Principal Problem:   Paranoid schizophrenia (HCC) Active Problems:   Essential hypertension, benign   Physical deconditioning   Total Time spent with patient: 1 hour  Subjective:   Amy Casey is a 58 y.o. female patient admitted with "why am I here".  HPI: Patient seen chart reviewed this is a 58 year old woman with a history of recurrent psychotic disorder.  She had been at [facility] for several days on the medical floor awaiting psychiatric bed assignment.  She had presented with an extended period of not eating not taking her medicine and becoming progressively more withdrawn.  Patient was evaluated as being intermittently catatonic very withdrawn and disorganized in her thinking.  Not eating and not able to do any of her self-care.  Because of her inability to ambulate or eat she was judged to be inappropriate for regular psychiatric bed.  After reviewing the chart she appeared to me to be a likely good candidate for electroconvulsive therapy.  I spoke with the treatment team at [facility] and have spoken with her husband.  Patient herself is unable to offer much in the way of lucid history.  Has little insight.  Flat confused affect.  Unable to explain why she is not eating or walking.  Denies actual suicidal intent or plan.  Denies hallucinations unclear if she is responding to internal stimuli.  Past Psychiatric History: Patient has a past history of recurrent episodes of this.  Had a previous hospitalization in 2012.  Diagnosis at that time was only "psychosis".  It still a little unclear to me whether this is  more of a schizophrenia illness or a recurrent psychotic depression.  No documented past suicide attempts.  Documented good response to antipsychotics including both Invega and Zyprexa  Risk to Self:   Risk to Others:   Prior Inpatient Therapy:   Prior Outpatient Therapy:    Past Medical History:  Past Medical History:  Diagnosis Date  . Depression    Dr. Mila Homer, [facility]; hospitalization 07/2010  . History of echocardiogram 2011   [facility]  . History of renal stone   . Hypertension   . Hypokalemia   . Obesity   . Schizophrenia (HCC)   . Wears glasses     Past Surgical History:  Procedure Laterality Date  . WISDOM TOOTH EXTRACTION     Family History:  Family History  Problem Relation Age of Onset  . Diabetes Mother   . Kidney disease Mother        dialysis  . Heart disease Mother   . Cancer Maternal Uncle        lung  . Stroke Neg Hx   . Hypertension Neg Hx   . Hyperlipidemia Neg Hx    Family Psychiatric  History: Unknown Social History:  Social History   Substance and Sexual Activity  Alcohol Use No     Social History   Substance and Sexual Activity  Drug Use No    Social History   Socioeconomic History  . Marital status: Married    Spouse name: Not on file  . Number of children: Not on file  . Years of education: Not on file  . Highest  education level: Not on file  Occupational History  . Not on file  Social Needs  . Financial resource strain: Not on file  . Food insecurity:    Worry: Not on file    Inability: Not on file  . Transportation needs:    Medical: Not on file    Non-medical: Not on file  Tobacco Use  . Smoking status: Never Smoker  . Smokeless tobacco: Never Used  Substance and Sexual Activity  . Alcohol use: No  . Drug use: No  . Sexual activity: Not Currently  Lifestyle  . Physical activity:    Days per week: Not on file    Minutes per session: Not on file  . Stress: Not on file  Relationships  . Social connections:     Talks on phone: Not on file    Gets together: Not on file    Attends religious service: Not on file    Active member of club or organization: Not on file    Attends meetings of clubs or organizations: Not on file    Relationship status: Not on file  Other Topics Concern  . Not on file  Social History Narrative   Married, 2 children, mother in law lives with them, was working as a Print production planner Social History:    Allergies:   Allergies  Allergen Reactions  . Paliperidone Other (See Comments)    Angioedema, drooling, slurred speech, ptosis  . Sulfa Antibiotics Other (See Comments)    unknown  . Lisinopril Rash    Angioedema     Labs:  Results for orders placed or performed during the hospital encounter of 08/09/18 (from the past 48 hour(s))  CBC     Status: None   Collection Time: 08/09/18 11:34 PM  Result Value Ref Range   WBC 6.4 4.0 - 10.5 K/uL   RBC 4.58 3.87 - 5.11 MIL/uL   Hemoglobin 13.1 12.0 - 15.0 g/dL   HCT 85.0 27.7 - 41.2 %   MCV 87.3 80.0 - 100.0 fL   MCH 28.6 26.0 - 34.0 pg   MCHC 32.8 30.0 - 36.0 g/dL   RDW 87.8 67.6 - 72.0 %   Platelets 377 150 - 400 K/uL   nRBC 0.0 0.0 - 0.2 %    Comment: Performed at [facility], [address] [place], [place] [address]  Creatinine, serum     Status: None   Collection Time: 08/09/18 11:34 PM  Result Value Ref Range   Creatinine, Ser 0.70 0.44 - 1.00 mg/dL   GFR calc non Af Amer >60 >60 mL/min   GFR calc Af Amer >60 >60 mL/min    Comment: Performed at [facility], [address] [place], [place] [address]  Basic metabolic panel     Status: Abnormal   Collection Time: 08/10/18  3:30 AM  Result Value Ref Range   Sodium 138 135 - 145 mmol/L   Potassium 3.6 3.5 - 5.1 mmol/L   Chloride 103 98 - 111 mmol/L   CO2 26 22 - 32 mmol/L   Glucose, Bld 125 (H) 70 - 99 mg/dL   BUN 22 (H) 6 - 20 mg/dL   Creatinine, Ser 6.29 0.44 - 1.00 mg/dL   Calcium 9.2 8.9 - 47.6 mg/dL   GFR calc non Af  Amer >60 >60 mL/min   GFR calc Af Amer >60 >60 mL/min   Anion gap 9 5 - 15    Comment: Performed at [facility],  [address], [place], [place] [address]  CBC     Status: None   Collection Time: 08/10/18  3:30 AM  Result Value Ref Range   WBC 6.1 4.0 - 10.5 K/uL   RBC 4.50 3.87 - 5.11 MIL/uL   Hemoglobin 12.9 12.0 - 15.0 g/dL   HCT 60.4 54.0 - 98.1 %   MCV 87.3 80.0 - 100.0 fL   MCH 28.7 26.0 - 34.0 pg   MCHC 32.8 30.0 - 36.0 g/dL   RDW 19.1 47.8 - 29.5 %   Platelets 393 150 - 400 K/uL   nRBC 0.0 0.0 - 0.2 %    Comment: Performed at [facility], [address], [place], [place] [address]    Current Facility-Administered Medications  Medication Dose Route Frequency Provider Last Rate Last Dose  . acetaminophen (TYLENOL) tablet 650 mg  650 mg Oral Q6H PRN Oralia Manis, MD       Or  . acetaminophen (TYLENOL) suppository 650 mg  650 mg Rectal Q6H PRN Oralia Manis, MD      . amLODipine (NORVASC) tablet 5 mg  5 mg Oral Daily Oralia Manis, MD   5 mg at 08/10/18 1205  . dextrose 5 % and 0.45 % NaCl with KCl 20 mEq/L infusion   Intravenous Continuous Arnaldo Natal, MD 100 mL/hr at 08/10/18 [phone]    . enoxaparin (LOVENOX) injection 40 mg  40 mg Subcutaneous Q24H Oralia Manis, MD   40 mg at 08/09/18 2357  . FLUoxetine (PROZAC) capsule 40 mg  40 mg Oral Daily Gretna Bergin T, MD      . OLANZapine zydis (ZYPREXA) disintegrating tablet 10 mg  10 mg Oral QHS Daviel Allegretto T, MD      . ondansetron (ZOFRAN) tablet 4 mg  4 mg Oral Q6H PRN Oralia Manis, MD       Or  . ondansetron [facility]) injection 4 mg  4 mg Intravenous Q6H PRN Oralia Manis, MD        Musculoskeletal: Strength & Muscle Tone: decreased Gait & Station: unsteady Patient leans: N/A  Psychiatric Specialty Exam: Physical Exam  Nursing note and vitals reviewed. Constitutional: She appears well-developed and well-nourished.  HENT:  Head: Normocephalic and atraumatic.  Eyes: Pupils are equal,  round, and reactive to light. Conjunctivae are normal.  Neck: Normal range of motion.  Cardiovascular: Regular rhythm and normal heart sounds.  Respiratory: Effort normal.  GI: Soft.  Musculoskeletal: Normal range of motion.     Comments: Weak and appears to be generally wasted  Neurological: She is alert.  Skin: Skin is warm and dry.  Psychiatric: Her affect is blunt and inappropriate. Her speech is tangential. She is slowed and withdrawn. She is not agitated and not aggressive. Thought content is paranoid. Cognition and memory are impaired. She expresses inappropriate judgment. She expresses no homicidal and no suicidal ideation. She is noncommunicative.    Review of Systems  Unable to perform ROS: Psychiatric disorder  Constitutional: Negative.   HENT: Negative.   Eyes: Negative.   Respiratory: Negative.   Cardiovascular: Negative.   Gastrointestinal: Negative.   Musculoskeletal: Negative.   Skin: Negative.   Neurological: Negative.   Psychiatric/Behavioral: Negative for depression, hallucinations, memory loss, substance abuse and suicidal ideas. The patient is not nervous/anxious and does not have insomnia.     Blood pressure 128/78, pulse (!) 102, temperature 98 F (36.7 C), temperature source Oral, resp. rate 18, height  (1.778 m), weight 72.9 kg, SpO2 98 %.Body mass  index is 23.06 kg/m.  General Appearance: Disheveled  Eye Contact:  Minimal  Speech:  Garbled and Slow  Volume:  Decreased  Mood:  Dysphoric  Affect:  Constricted  Thought Process:  Disorganized  Orientation:  Negative  Thought Content:  Illogical, Rumination and Tangential  Suicidal Thoughts:  No  Homicidal Thoughts:  No  Memory:  Immediate;   Fair Recent;   Poor Remote;   Poor  Judgement:  Impaired  Insight:  Shallow  Psychomotor Activity:  Decreased  Concentration:  Concentration: Poor  Recall:  Poor  Fund of Knowledge:  Poor  Language:  Poor  Akathisia:  No  Handed:  Right  AIMS (if  indicated):     Assets:  Housing Physical Health Resilience Social Support  ADL's:  Impaired  Cognition:  Impaired,  Moderate and Severe  Sleep:        Treatment Plan Summary: Daily contact with patient to assess and evaluate symptoms and progress in treatment, Medication management and Plan This is a 58 year old woman who presents 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  Without improvement there is significant risk of morbidity including death from this condition.  [facility]s [facility] to treat the patient with appropriate psychiatric medicine but difficulties arose because of the patient's noncompliance and the inability to have her admitted to a psychiatric ward.  Given the current condition and set of symptoms she is a good candidate for electroconvulsive therapy.  Reviewing her history physical and labs there is no indication of any contraindication.  I have spoken with her husband on the telephone and plan to speak to him again this afternoon to ask him to sign the consent forms.  If the patient's husband is willing to give consent I will except that as she is currently unable to make decisions for herself about treatment.  I have put in orders for her to have regular diet over the weekend and to have physical therapy consultant for deconditioning.  I have ordered restarting Prozac and Zyprexa.  We will plan electroconvulsive therapy to start with first treatment Monday morning.  I will sign out to the psychiatry consultant over the weekend.  Please keep the psychiatry consult on the chart to make sure it is visible to us.  Disposition: Patient will start ECT on Monday.  I am optimistic that we will see some improvement soon so that we can transfer her to the psychiatric ward as soon as she is ambulatory and eating.  Mordecai RasmussenJohn Suzy Kugel, MD 08/10/2018 4:22 PM

## 2018-08-10 NOTE — Progress Notes (Signed)
Pt HR elevated up to the 140s during bathing per NT. VS taken automatically, BP 96/70, HR 112, lying in bed. Rechecked BP manually, BP 128/78, HR 102. Pt denies any SOB, dizziness, or pain. Continue to monitor.    Madie Reno, RN

## 2018-08-10 NOTE — Progress Notes (Signed)
Per staffing need order for sitter. No order noted in order history. Contacted MD for order.   Madie Reno, RN

## 2018-08-10 NOTE — Progress Notes (Signed)
Daughter called requesting an update on patient's plan of care. Advised daughter that patient would be undergoing ECT therapy on Monday. Daughter was not happy with the idea of ECT therapy. Daughter requested to speak with physician. I recommended daughter speak with husband who is her stepfather.   Madie Reno, RN

## 2018-08-10 NOTE — Progress Notes (Signed)
[facility] [place] [facility]   PATIENT NAME: Amy Casey    MR#:  [id number]  DATE OF BIRTH:  [birth year]  SUBJECTIVE:  CHIEF COMPLAINT:  No chief complaint on file.  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 She just speaks few words but does not communicate well.  Does not have any complaints.  REVIEW OF SYSTEMS:  Patient is not much communicative and catatonic so cannot get reliable review of system from her..   ROS  DRUG ALLERGIES:   Allergies  Allergen Reactions  . Paliperidone Other (See Comments)    Angioedema, drooling, slurred speech, ptosis  . Sulfa Antibiotics Other (See Comments)    unknown  . Lisinopril Rash    Angioedema     VITALS:  Blood pressure 128/78, pulse (!) 102, temperature 98 F (36.7 C), temperature source Oral, resp. rate 18, height 5\' 10"  (1.778 m), weight 72.9 kg, SpO2 98 %.  PHYSICAL EXAMINATION:  GENERAL:  58 y.o.-year-old patient lying in the bed with no acute distress.  EYES: Pupils equal, round, reactive to light . No scleral icterus. Extraocular muscles intact.  HEENT: Head atraumatic, normocephalic. Oropharynx and nasopharynx clear.  NECK:  Supple, no jugular venous distention. No thyroid enlargement, no tenderness.  LUNGS: Normal breath sounds bilaterally, no wheezing, rales,rhonchi or crepitation. No use of accessory muscles of respiration.  CARDIOVASCULAR: S1, S2 normal. No murmurs, rubs, or gallops.  ABDOMEN: Soft, nontender, nondistended. Bowel sounds present. No organomegaly or mass.  EXTREMITIES: No pedal edema, cyanosis, or clubbing.  NEUROLOGIC: Patient is alert and eyes open and looking at me and just speak like 1-2 words but does not follow any commands.  Unable to assess neurological exam on her.   PSYCHIATRIC: The patient is alert , very less communicative.  SKIN: No obvious rash, lesion, or ulcer.   Physical Exam LABORATORY PANEL:   CBC Recent Labs   Lab 08/10/18 0330  WBC 6.1  HGB 12.9  HCT 39.3  PLT 393   ------------------------------------------------------------------------------------------------------------------  Chemistries  Recent Labs  Lab 08/09/18 0235  08/10/18 0330  NA 138  --  138  K 3.7  --  3.6  CL 103  --  103  CO2 25  --  26  GLUCOSE 117*  --  125*  BUN 18  --  22*  CREATININE 0.98   < > 0.89  CALCIUM 9.4  --  9.2  MG 2.0  --   --    < > = values in this interval not displayed.   ------------------------------------------------------------------------------------------------------------------  Cardiac Enzymes No results for input(s): TROPONINI in the last 168 hours. ------------------------------------------------------------------------------------------------------------------  RADIOLOGY:  No results found.  ASSESSMENT AND PLAN:   Principal Problem:   Paranoid schizophrenia (HCC) Active Problems:   Essential hypertension, benign   Physical deconditioning    *  Paranoid schizophrenia (HCC) -with likely catatonic state versus severe psychotic decompensation.  Psychiatry recommended transfer to our facility for likely ECT.  Psychiatry consult placed. Active Problems:  *  Essential hypertension, benign -continue home meds *  Physical deconditioning -patient will likely need to work with physical therapy when she is able, and will likely need a nutrition consult as well   All the records are reviewed and case discussed with Care Management/Social Workerr. Management plans discussed with the patient, family and they are in agreement.  CODE STATUS: Full  TOTAL TIME TAKING CARE OF THIS PATIENT: *35 minutes.     POSSIBLE D/C IN  1-2 DAYS, DEPENDING ON CLINICAL CONDITION.   Altamese DillingVaibhavkumar Quintara Bost M.D on 08/10/2018   Between 7am to 6pm - Pager - [phone]  After 6pm go to [email or web address] - password [email or web address] [facility]  [facility]  Office  [phone]  CC: Primary care  physician; Jac Canavanysinger, David S, PA-C  Note: This dictation was prepared with Dragon dictation along with smaller phrase technology. Any transcriptional errors that result from this process are unintentional.

## 2018-08-10 NOTE — Consult Note (Signed)
Consult update: I had an appointment to meet the patient's husband this evening between 5 and 530.  He has not arrived yet.  I will check for him again and if he is not here this evening I have left telephone messages with my cell phone.  I hope we will be able to come up with a meeting time before Monday.

## 2018-08-11 ENCOUNTER — Encounter: Payer: Self-pay | Admitting: *Deleted

## 2018-08-11 MED ORDER — ENSURE ENLIVE PO LIQD
237.0000 mL | Freq: Two times a day (BID) | ORAL | Status: DC
  Administered 2018-08-11: 237 mL via ORAL

## 2018-08-11 MED ORDER — SODIUM CHLORIDE 0.9% FLUSH: 10.0000 mL | INTRAVENOUS | Status: DC | PRN

## 2018-08-11 NOTE — Progress Notes (Signed)
Pt given bed bath and linen changed

## 2018-08-11 NOTE — Progress Notes (Signed)
Patient sitting up on the chair most of the afternoon.  Pt has been offered her meals and snacks but she refuses by saying "im fine". Pt is alert and tries to answer questions but not with full sentences.

## 2018-08-11 NOTE — Progress Notes (Signed)
This nurse spoke with pt's husband over the phone he verbalizes not wanting to have ECT done on his wife. Per Dr Toni Amend, he has been trying to reach him but no success. Dr Toni Amend number given to Pts husband to call  Later on, pt's daughter Wilfred Lacy called this nurse to confirm that ECT has been cancelled as family has refused tx.  Stephannie Peters, RN

## 2018-08-11 NOTE — Progress Notes (Signed)
Behavioral medicine here speaking with pt

## 2018-08-11 NOTE — Evaluation (Signed)
Physical Therapy Evaluation Patient Details Name: Amy Casey MRN: [id number] DOB: 1960-11-05 Today's Date: 08/11/2018   History of Present Illness  59 yo Female was admitted to [facility] with AMS; She has a history of paranoid schizophrenia and has been in a catatonic state, unwilling to eat or walk. She was transferred to [facility] with the plan for possible ECT treatment on Monday 08/13/18. PMH significant: depression, HTN, hypokalemia;   Clinical Impression  58 yo Female with AMS reports functioning independently prior to admittance. Patient reports living with her family and was working at [employer], making biscuits, prior to recent hospitalization. She does report 2-3 recent falls with most recent fall occurring when she tripped over her pants. Patient denies any use of AD. Patient is currently mod I for most bed mobility. She does require CGA for sit<>Stand transfer especially from low seat. Patient ambulated in hospital, CGA while using IV pole intermittently. She exhibits a step through gait pattern with good foot clearance. Although she does ambulate at slower gait speed and is often reaching out for wall railing or furniture to hold for balance. She does exhibit generalized weakness in BLE grossly 3+/5. Patient would benefit from skilled PT intervention to improve strength, balance and gait safety; Recommend home health PT upon discharge.     Follow Up Recommendations Home health PT    Equipment Recommendations  Other (comment)(to be determined post acute setting)    Recommendations for Other Services (ordered per protocol)     Precautions / Restrictions Precautions Precautions: Fall Restrictions Weight Bearing Restrictions: No      Mobility  Bed Mobility Overal bed mobility: Modified Independent Bed Mobility: Supine to Sit     Supine to sit: HOB elevated;Modified independent (Device/Increase time)     General bed mobility comments: uses bed rails, able to exhibit good  positioning and safety;   Transfers Overall transfer level: Needs assistance Equipment used: None Transfers: Sit to/from Stand Sit to Stand: Min guard         General transfer comment: transfers sit<>Stand from bed without AD, CGA for safety; She does require cues for forward weight shift to improve transfer ability; increased time required;   Ambulation/Gait Ambulation/Gait assistance: Min guard Gait Distance (Feet): 40 Feet Assistive device: IV Pole Gait Pattern/deviations: Step-through pattern;Decreased step length - right;Decreased step length - left;Narrow base of support     General Gait Details: able to step through with good foot clearance; slower gait speed, narrow base of support, uses IV pole for steadying. Patient able to take a few steps without AD but often reaches out to hold onto wall or furniture for safety;   Stairs            Wheelchair Mobility    Modified Rankin (Stroke Patients Only)       Balance Overall balance assessment: Needs assistance Sitting-balance support: Feet supported;No upper extremity supported Sitting balance-Leahy Scale: Fair       Standing balance-Leahy Scale: Fair                               Pertinent Vitals/Pain Pain Assessment: No/denies pain    Home Living Family/patient expects to be discharged to:: Private residence Living Arrangements: Spouse/significant other;Other relatives Available Help at Discharge: Family;Available 24 hours/day Type of Home: House Home Access: Stairs to enter Entrance Stairs-Rails: Doctor, general practice of Steps: 5-6 Home Layout: One level Home Equipment: None      Prior Function  Level of Independence: Independent         Comments: 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[employer], making biscuits and was walking independently without difficulty     Hand Dominance   Dominant Hand: Right    Extremity/Trunk Assessment   Upper Extremity Assessment Upper  Extremity Assessment: Generalized weakness    Lower Extremity Assessment Lower Extremity Assessment: Generalized weakness    Cervical / Trunk Assessment Cervical / Trunk Assessment: Normal  Communication   Communication: No difficulties  Cognition Arousal/Alertness: Awake/alert Behavior During Therapy: Flat affect Overall Cognitive Status: Difficult to assess                         Following Commands: Follows one step commands consistently              General Comments      Exercises Other Exercises Other Exercises: Instructed patient in seated exercise: march x10, LAQ x10, ankle DF/PF x10 with cues to increase AROM for better strengthening and flexibility; pt able to follow instruction well;    Assessment/Plan    PT Assessment Patient needs continued PT services  PT Problem List Decreased strength;Decreased range of motion;Decreased activity tolerance;Decreased balance;Decreased mobility;Decreased coordination;Decreased cognition;Decreased knowledge of use of DME;Decreased safety awareness;Decreased knowledge of precautions       PT Treatment Interventions DME instruction;Gait training;Functional mobility training;Therapeutic activities;Therapeutic exercise;Balance training;Neuromuscular re-education;Cognitive remediation;Patient/family education    PT Goals (Current goals can be found in the Care Plan section)  Acute Rehab PT Goals Patient Stated Goal: did not state PT Goal Formulation: With patient Time For Goal Achievement: 08/25/18 Potential to Achieve Goals: Good    Frequency Min 2X/week   Barriers to discharge Inaccessible home environment has stairs to enter    Co-evaluation               AM-PAC PT "6 Clicks" Mobility  Outcome Measure Help needed turning from your back to your side while in a flat bed without using bedrails?: None Help needed moving from lying on your back to sitting on the side of a flat bed without using bedrails?:  None Help needed moving to and from a bed to a chair (including a wheelchair)?: A Little Help needed standing up from a chair using your arms (e.g., wheelchair or bedside chair)?: A Little Help needed to walk in hospital room?: A Little Help needed climbing 3-5 steps with a railing? : A Lot 6 Click Score: 19    End of Session Equipment Utilized During Treatment: Gait belt Activity Tolerance: Patient tolerated treatment well Patient left: with call bell/phone within reach;with nursing/sitter in room;in chair(bed in semi-chair position) Nurse Communication: Mobility status PT Visit Diagnosis: Unsteadiness on feet (R26.81);Other abnormalities of gait and mobility (R26.89);Muscle weakness (generalized) (M62.81);Difficulty in walking, not elsewhere classified (R26.2)    Time: [id number] PT Time Calculation (min) (ACUTE ONLY): 29 min   Charges:   PT Evaluation $PT Eval Low Complexity: 1 Low PT Treatments $Therapeutic Exercise: 8-22 mins          Trotter,Margaret PT, DPT 08/11/2018, 1:18 PM

## 2018-08-11 NOTE — Progress Notes (Signed)
[employer] [facility] [place] [facility]     PATIENT NAME: Amy QualeBeverly Casey    MR#:  [id number]  DATE OF BIRTH:  05/22/1960  SUBJECTIVE:   Patient admitted to the hospital due to her catatonic schizophrenia and possible plan for initiating ECT.  No acute events overnight.  Sitter is at bedside.  Patient denies any suicidal or homicidal ideations or any hallucinations.  REVIEW OF SYSTEMS:    Review of Systems  Constitutional: Negative for chills and fever.  HENT: Negative for congestion and tinnitus.   Eyes: Negative for blurred vision and double vision.  Respiratory: Negative for cough, shortness of breath and wheezing.   Cardiovascular: Negative for chest pain, orthopnea and PND.  Gastrointestinal: Negative for abdominal pain, diarrhea, nausea and vomiting.  Genitourinary: Negative for dysuria and hematuria.  Neurological: Negative for dizziness, sensory change and focal weakness.  All other systems reviewed and are negative.   Nutrition: Regular Tolerating Diet: Yes Tolerating PT: Ambulatory  DRUG ALLERGIES:   Allergies  Allergen Reactions  . Paliperidone Other (See Comments)    Angioedema, drooling, slurred speech, ptosis  . Sulfa Antibiotics Other (See Comments)    unknown  . Lisinopril Rash    Angioedema     VITALS:  Blood pressure 134/78, pulse 70, temperature 98.3 F (36.8 C), temperature source Oral, resp. rate 15, height 5\' 10"  (1.778 m), weight 72.9 kg, SpO2 99 %.  PHYSICAL EXAMINATION:   Physical Exam  GENERAL:  58 y.o.-year-old patient lying in bed in no acute distress.  EYES: Pupils equal, round, reactive to light and accommodation. No scleral icterus. Extraocular muscles intact.  HEENT: Head atraumatic, normocephalic. Oropharynx and nasopharynx clear.  NECK:  Supple, no jugular venous distention. No thyroid enlargement, no tenderness.  LUNGS: Normal breath sounds bilaterally, no wheezing, rales, rhonchi. No use of accessory muscles of  respiration.  CARDIOVASCULAR: S1, S2 normal. No murmurs, rubs, or gallops.  ABDOMEN: Soft, nontender, nondistended. Bowel sounds present. No organomegaly or mass.  EXTREMITIES: No cyanosis, clubbing or edema b/l.    NEUROLOGIC: Cranial nerves II through XII are intact. No focal Motor or sensory deficits b/l.  PSYCHIATRIC: The patient is alert and oriented x 3.  SKIN: No obvious rash, lesion, or ulcer.    LABORATORY PANEL:   CBC Recent Labs  Lab 08/10/18 0330  WBC 6.1  HGB 12.9  HCT 39.3  PLT 393   ------------------------------------------------------------------------------------------------------------------  Chemistries  Recent Labs  Lab 08/09/18 0235  08/10/18 0330  NA 138  --  138  K 3.7  --  3.6  CL 103  --  103  CO2 25  --  26  GLUCOSE 117*  --  125*  BUN 18  --  22*  CREATININE 0.98   < > 0.89  CALCIUM 9.4  --  9.2  MG 2.0  --   --    < > = values in this interval not displayed.   ------------------------------------------------------------------------------------------------------------------  Cardiac Enzymes No results for input(s): TROPONINI in the last 168 hours. ------------------------------------------------------------------------------------------------------------------  RADIOLOGY:  No results found.   ASSESSMENT AND PLAN:   58 year old female with past medical history of catatonic schizophrenia, hypertension, depression who presented to the hospital for treatment of her underlying schizophrenia/depression with ECT.  1.  Schizophrenia/depression- psychiatry following and possibly planning on initiating ECT. -Continue further care as per psychiatry. -Continue Zyprexa.  2.  Essential hypertension-blood pressure stable. -Continue Norvasc.  3.  Depression-continue Prozac.     All the records are reviewed  and case discussed with Care Management/Social Worker. Management plans discussed with the patient, family and they are in agreement.   CODE STATUS: Full code  DVT Prophylaxis: Lovenox  TOTAL TIME TAKING CARE OF THIS PATIENT: 30 minutes.   POSSIBLE D/C IN 3-4 DAYS, DEPENDING ON CLINICAL CONDITION.   Houston Siren M.D on 08/11/2018 at 2:44 PM  Between 7am to 6pm - Pager - [phone]  After 6pm go to [email or web address] - Social research officer, government  [facility]  Office  [phone]  CC: Primary care physician; Jac Canavan, PA-C

## 2018-08-11 NOTE — Consult Note (Signed)
[facility] Face-to-Face Psychiatry Consult   Reason for Consult: Consult to follow-up with this patient who is a 58 year old woman with a history of recurrent psychosis who has been transferred to [place] for probable ECT treatment  Referring Physician: Anne Hahn Patient Identification: Amy Casey MRN:  [id number] Principal Diagnosis: Paranoid schizophrenia (HCC) Diagnosis:  Principal Problem:   Paranoid schizophrenia (HCC) Active Problems:   Essential hypertension, benign   Physical deconditioning   Total Time spent with patient: 30 minutes  Subjective:     Chief complaint: " Nothing is going to help"  HPI:  Per chart review Patient seen chart reviewed this is a 58 year old woman with a history of recurrent psychotic disorder. She had been at [facility] for several days on the medical floor awaiting psychiatric bed assignment.  She had presented with an extended period of not eating not taking her medicine and becoming progressively more withdrawn.  Patient was evaluated as being intermittently catatonic very withdrawn and disorganized in her thinking.  Not eating and not able to do any of her self-care.  Because of her inability to ambulate or eat she was judged to be inappropriate for regular psychiatric bed.  Dr. Toni Amend is reviewing for potential ECT.  In discussing with nursing staff and reviewing the chart today it appears that the patient's family and husband are now refusing ECT.  Noted that she had urinated while ambulating earlier today.  She has had no oral intake today, other than medications.  On interview: The patient was found sitting in her room upright wearing a facemask and receiving IV fluids.  She has a one-to-one Comptroller.  She had surprisingly more verbal output than anticipated, she spoke with a soft voice, she was oriented to herself place and time but not to situation.  She appeared to have very little memory of the events at [facility].  She did appear to have some  understanding that she was transferred in order to receive ECT, but appears fearful when discussing that treatment option and conveyed that it would be "unhelpful for her anyway".  Likewise that she stated that food and fluids were "unhelpful" and that she felt frightened for what would happen should she eat or drink.  She appeared to be paranoid about some items in the room and her ongoing care, but had great difficulty in communicating details related to these thoughts.  She refused to eat or drink, but stated that she would like to leave the hospital once she can.   She will have her brief physical exam to assist with the [place] catatonia scale: Her cardinal symptoms appear to be immobility/stupor, mutism, staring and withdrawal.   Past Psychiatric History: Per chart:   Patient has a past history of recurrent episodes of this.  Had a previous hospitalization in 2012.  Diagnosis at that time was only "psychosis".  It still a little unclear to me whether this is more of a schizophrenia illness or a recurrent psychotic depression.  No documented past suicide attempts.  Documented good response to antipsychotics including both Invega and Zyprexa  Past Medical History:  Past Medical History:  Diagnosis Date  . Depression    Amy Casey, [facility]; hospitalization 07/2010  . History of echocardiogram 2011   [facility]  . History of renal stone   . Hypertension   . Hypokalemia   . Obesity   . Schizophrenia (HCC)   . Wears glasses     Past Surgical History:  Procedure Laterality Date  .  WISDOM TOOTH EXTRACTION     Family History:  Family History  Problem Relation Age of Onset  . Diabetes Mother   . Kidney disease Mother        dialysis  . Heart disease Mother   . Cancer Maternal Uncle        lung  . Stroke Neg Hx   . Hypertension Neg Hx   . Hyperlipidemia Neg Hx    Family Psychiatric  History: Unknown Social History:  Social History   Substance and Sexual Activity  Alcohol Use  No     Social History   Substance and Sexual Activity  Drug Use No    Social History   Socioeconomic History  . Marital status: Married    Spouse name: Not on file  . Number of children: Not on file  . Years of education: Not on file  . Highest education level: Not on file  Occupational History  . Not on file  Social Needs  . Financial resource strain: Not on file  . Food insecurity:    Worry: Not on file    Inability: Not on file  . Transportation needs:    Medical: Not on file    Non-medical: Not on file  Tobacco Use  . Smoking status: Never Smoker  . Smokeless tobacco: Never Used  Substance and Sexual Activity  . Alcohol use: No  . Drug use: No  . Sexual activity: Not Currently  Lifestyle  . Physical activity:    Days per week: Not on file    Minutes per session: Not on file  . Stress: Not on file  Relationships  . Social connections:    Talks on phone: Not on file    Gets together: Not on file    Attends religious service: Not on file    Active member of club or organization: Not on file    Attends meetings of clubs or organizations: Not on file    Relationship status: Not on file  Other Topics Concern  . Not on file  Social History Narrative   Married, 2 children, mother in law lives with them, was working as a Print production planner Social History:    Allergies:   Allergies  Allergen Reactions  . Paliperidone Other (See Comments)    Angioedema, drooling, slurred speech, ptosis  . Sulfa Antibiotics Other (See Comments)    unknown  . Lisinopril Rash    Angioedema     Labs:  Results for orders placed or performed during the hospital encounter of 08/09/18 (from the past 48 hour(s))  CBC     Status: None   Collection Time: 08/09/18 11:34 PM  Result Value Ref Range   WBC 6.4 4.0 - 10.5 K/uL   RBC 4.58 3.87 - 5.11 MIL/uL   Hemoglobin 13.1 12.0 - 15.0 g/dL   HCT 36.4 68.0 - 32.1 %   MCV 87.3 80.0 - 100.0 fL   MCH 28.6 26.0 - 34.0 pg   MCHC 32.8  30.0 - 36.0 g/dL   RDW 22.4 82.5 - 00.3 %   Platelets 377 150 - 400 K/uL   nRBC 0.0 0.0 - 0.2 %    Comment: Performed at [facility], [address] [place], [place] [address]  Creatinine, serum     Status: None   Collection Time: 08/09/18 11:34 PM  Result Value Ref Range   Creatinine, Ser 0.70 0.44 - 1.00 mg/dL   GFR calc non Af Amer >60 >60 mL/min  GFR calc Af Amer >60 >60 mL/min    Comment: Performed at [facility], [address] [place], [place] [address]  Basic metabolic panel     Status: Abnormal   Collection Time: 08/10/18  3:30 AM  Result Value Ref Range   Sodium 138 135 - 145 mmol/L   Potassium 3.6 3.5 - 5.1 mmol/L   Chloride 103 98 - 111 mmol/L   CO2 26 22 - 32 mmol/L   Glucose, Bld 125 (H) 70 - 99 mg/dL   BUN 22 (H) 6 - 20 mg/dL   Creatinine, Ser 6.04 0.44 - 1.00 mg/dL   Calcium 9.2 8.9 - 54.0 mg/dL   GFR calc non Af Amer >60 >60 mL/min   GFR calc Af Amer >60 >60 mL/min   Anion gap 9 5 - 15    Comment: Performed at [facility], [address] [place], [place] [address]  CBC     Status: None   Collection Time: 08/10/18  3:30 AM  Result Value Ref Range   WBC 6.1 4.0 - 10.5 K/uL   RBC 4.50 3.87 - 5.11 MIL/uL   Hemoglobin 12.9 12.0 - 15.0 g/dL   HCT 14.7 82.9 - 56.2 %   MCV 87.3 80.0 - 100.0 fL   MCH 28.7 26.0 - 34.0 pg   MCHC 32.8 30.0 - 36.0 g/dL   RDW 13.0 86.5 - 78.4 %   Platelets 393 150 - 400 K/uL   nRBC 0.0 0.0 - 0.2 %    Comment: Performed at [facility], [address] [place], [place] [address]    Current Facility-Administered Medications  Medication Dose Route Frequency Provider Last Rate Last Dose  . acetaminophen (TYLENOL) tablet 650 mg  650 mg Oral Q6H PRN Oralia Manis, MD       Or  . acetaminophen (TYLENOL) suppository 650 mg  650 mg Rectal Q6H PRN Oralia Manis, MD      . amLODipine (NORVASC) tablet 5 mg  5 mg Oral Daily Oralia Manis, MD   5 mg at 08/11/18 1035  . dextrose 5 % and 0.45 % NaCl  with KCl 20 mEq/L infusion   Intravenous Continuous Arnaldo Natal, MD 100 mL/hr at 08/11/18 1659    . enoxaparin (LOVENOX) injection 40 mg  40 mg Subcutaneous Q24H Oralia Manis, MD   40 mg at 08/10/18 2302  . feeding supplement (ENSURE ENLIVE) (ENSURE ENLIVE) liquid 237 mL  237 mL Oral BID BM Houston Siren, MD   237 mL at 08/11/18 1514  . FLUoxetine (PROZAC) capsule 40 mg  40 mg Oral Daily Clapacs, Jackquline Denmark, MD   40 mg at 08/11/18 1036  . OLANZapine zydis (ZYPREXA) disintegrating tablet 10 mg  10 mg Oral QHS Clapacs, Jackquline Denmark, MD   10 mg at 08/10/18 2106  . ondansetron (ZOFRAN) tablet 4 mg  4 mg Oral Q6H PRN Oralia Manis, MD       Or  . ondansetron [facility]) injection 4 mg  4 mg Intravenous Q6H PRN Oralia Manis, MD      . sodium chloride flush (NS) 0.9 % injection 10-40 mL  10-40 mL Intracatheter PRN Cherlynn Kaiser, Rolly Pancake, MD        Musculoskeletal: Strength & Muscle Tone: decreased Gait & Station: unsteady Patient leans: N/A  Psychiatric Specialty Exam: Physical Exam  Nursing note and vitals reviewed. Constitutional: She appears well-developed and well-nourished.  HENT:  Head: Normocephalic and atraumatic.  Eyes: Pupils are equal, round, and reactive to  light. Conjunctivae are normal.  Neck: Normal range of motion.  Cardiovascular: Regular rhythm and normal heart sounds.  Respiratory: Effort normal.  GI: Soft.  Musculoskeletal: Normal range of motion.     Comments: Weak and appears to be generally wasted  Neurological: She is alert.  Skin: Skin is warm and dry.  Psychiatric: Her affect is blunt and inappropriate. Her speech is tangential. She is slowed and withdrawn. She is not agitated and not aggressive. Thought content is paranoid. Cognition and memory are impaired. She expresses inappropriate judgment. She expresses no homicidal and no suicidal ideation. She is noncommunicative.    Review of Systems  Unable to perform ROS: Psychiatric disorder  Constitutional: Negative.    HENT: Negative.   Eyes: Negative.   Respiratory: Negative.   Cardiovascular: Negative.   Gastrointestinal: Negative.   Musculoskeletal: Negative.   Skin: Negative.   Neurological: Negative.   Psychiatric/Behavioral: Negative for depression, hallucinations, memory loss, substance abuse and suicidal ideas. The patient is not nervous/anxious and does not have insomnia.     Blood pressure 134/78, pulse 70, temperature 98.3 F (36.8 C), temperature source Oral, resp. rate 15, height 5\' 10"  (1.778 m), weight 72.9 kg, SpO2 99 %.Body mass index is 23.06 kg/m.  General Appearance: Disheveled  Eye Contact:  Minimal  Speech:  Slow and Soft volume  Volume:  Decreased  Mood:  I do not know  Affect:  Flat  Thought Process: Briefly linear, with perseveration on themes of worthlessness/and catastrophization  Orientation:  Other:  Person place time but not situation  Thought Content:  Illogical, Rumination and Tangential  Suicidal Thoughts:  No  Homicidal Thoughts:  No  Memory:  Immediate;   Fair Recent;   Poor Remote;   Poor  Judgement:  Impaired  Insight:  Shallow  Psychomotor Activity:  Decreased  Concentration:  Concentration: Poor  Recall:  Poor  Fund of Knowledge:  Poor  Language:  Poor  Akathisia:  No  Handed:  Right  AIMS (if indicated):     Assets:  Housing Physical Health Resilience Social Support  ADL's:  Impaired  Cognition:  Impaired,  Moderate and Severe  Sleep:        Treatment Plan Summary: Daily contact with the patient to assess and evaluate symptoms and progress in treatment, medication management and plan.  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  Without treatment there is a significant risk of morbidity and mortality.  The patient was [facility] for the initiation of ECT, however that appears to have been halted per the  notes by the family.  The patient may require an ethics consultation, along with the involvement of risk management as if the patient does not respond to medication interventions, then not utilizing procedural psychiatric methods may lead to a decline in her condition and she may suffer harm.   Continue Prozac 40 mg p.o. daily Continue olanzapine Zydis 10 mg p.o. nightly  will consider reinstituting Ativan, will reassess tomorrow and attempt to contact family  Disposition:  The patient would benefit from ECT at this time however this may be delayed now that the family is refusing.  The patient would warrant inpatient psychiatric hospitalization however at this time she is requiring IV fluids and requires assistance with ambulation   Luciano CutterJustin R Saranda Legrande, DO 08/11/2018 5:15 PM

## 2018-08-11 NOTE — Progress Notes (Signed)
Pt on the phone with husband.

## 2018-08-11 NOTE — Progress Notes (Signed)
Pt breakfast tray brought into room. Pt has been asked multiple times by this sitter if she would like to eat all to which she replied no.

## 2018-08-11 NOTE — Progress Notes (Signed)
Physical therapy walking pt

## 2018-08-11 NOTE — Progress Notes (Signed)
Pt urinated in floor on the way to the restroom. Pt socks changed.

## 2018-08-11 NOTE — Progress Notes (Signed)
Nutritional services called pt to get lunch order, pt told them she did not want a lunch tray.

## 2018-08-11 NOTE — Progress Notes (Signed)
Attempted to wash pt hair, pt refused.

## 2018-08-12 MED ORDER — MIRTAZAPINE 15 MG PO TBDP
15.0000 mg | ORAL_TABLET | Freq: Every day | ORAL | Status: DC
  Administered 2018-08-12: 15 mg via ORAL
  Filled 2018-08-12 (×2): qty 1

## 2018-08-12 MED ORDER — LORAZEPAM 2 MG/ML IJ SOLN
1.0000 mg | Freq: Once | INTRAMUSCULAR | Status: AC
  Administered 2018-08-12: 1 mg via INTRAVENOUS
  Filled 2018-08-12: qty 1

## 2018-08-12 MED ORDER — OLANZAPINE 5 MG PO TBDP
15.0000 mg | ORAL_TABLET | Freq: Every day | ORAL | Status: DC
  Administered 2018-08-12 – 2018-08-13 (×2): 15 mg via ORAL
  Filled 2018-08-12 (×3): qty 1

## 2018-08-12 NOTE — Consult Note (Signed)
[facility] Face-to-Face Psychiatry Consult   Reason for Consult: Consult to follow-up with this patient who is a 58 year old woman with a history of recurrent psychosis who has been transferred to [place] for probable ECT treatment  Referring Physician: Anne Hahn Patient Identification: Amy Casey MRN:  [id number] Principal Diagnosis: Paranoid schizophrenia (HCC) Diagnosis:  Principal Problem:   Paranoid schizophrenia (HCC) Active Problems:   Essential hypertension, benign   Physical deconditioning   Total Time spent with patient: 30 minutes  Subjective:    Chief complaint: "They have put something inside me"  HPI:  Per chart review  this is a 58 year old woman with a history of recurrent psychotic disorder. She had been at [facility] for several days on the medical floor awaiting psychiatric bed assignment.  She had presented with an extended period of not eating not taking her medicine and becoming progressively more withdrawn.  Patient was evaluated as being intermittently catatonic very withdrawn and disorganized in her thinking.  Not eating and not able to do any of her self-care.  Because of her inability to ambulate or eat she was judged to be inappropriate for regular psychiatric bed.  Dr. Toni Amend is reviewing for potential ECT.  It appears that the patient's family and husband are now refusing ECT.  Noted that she had urinated while ambulating earlier today. She has had no oral intake since she was admitted to [facility], other than medications.  On interview: The patient was found sitting in her room upright wearing a facemask and receiving IV fluids. She has a one-to-one Comptroller.  Today she had less verbal output compared to yesterday, and was much quieter.  Again she was oriented to self place but not to situation.  She continues to endorse that all medications, and food and water are not going to be helpful for her.  At one point she stated that food and water  are just going to "fall out of her", and was unwilling to take medication this morning stating that there is nothing for it to "sit on".  She was willing to participate in a brief physical exam to assist with the [place] catatonia scale: Her cardinal symptoms appear to be immobility/stupor, mutism, staring, rigidity, periods of negativism and withdrawal.   She was given a Ativan challenge this morning around 10:40 AM with mixed results, she appeared more somnolent on her exam after receiving the medication, but when awoken had increased volume and verbal output.  The other items of her catatonic presentation were relatively unchanged.   Past Psychiatric History: Per chart:   Patient has a past history of recurrent episodes of this.  Had a previous hospitalization in 2012.  Diagnosis at that time was only "psychosis".  It still a little unclear to me whether this is more of a schizophrenia illness or a recurrent psychotic depression.  No documented past suicide attempts.  Documented good response to antipsychotics including both Invega and Zyprexa  Past Medical History:  Past Medical History:  Diagnosis Date  . Depression    Dr. Mila Homer, [facility]; hospitalization 07/2010  . History of echocardiogram 2011   [facility]  . History of renal stone   . Hypertension   . Hypokalemia   . Obesity   . Schizophrenia (HCC)   . Wears glasses     Past Surgical History:  Procedure Laterality Date  . WISDOM TOOTH EXTRACTION     Family History:  Family History  Problem Relation Age of Onset  . Diabetes  Mother   . Kidney disease Mother        dialysis  . Heart disease Mother   . Cancer Maternal Uncle        lung  . Stroke Neg Hx   . Hypertension Neg Hx   . Hyperlipidemia Neg Hx    Family Psychiatric  History: Unknown Social History:  Social History   Substance and Sexual Activity  Alcohol Use No     Social History   Substance and Sexual Activity  Drug Use No    Social History    Socioeconomic History  . Marital status: Married    Spouse name: Not on file  . Number of children: Not on file  . Years of education: Not on file  . Highest education level: Not on file  Occupational History  . Not on file  Social Needs  . Financial resource strain: Not on file  . Food insecurity:    Worry: Not on file    Inability: Not on file  . Transportation needs:    Medical: Not on file    Non-medical: Not on file  Tobacco Use  . Smoking status: Never Smoker  . Smokeless tobacco: Never Used  Substance and Sexual Activity  . Alcohol use: No  . Drug use: No  . Sexual activity: Not Currently  Lifestyle  . Physical activity:    Days per week: Not on file    Minutes per session: Not on file  . Stress: Not on file  Relationships  . Social connections:    Talks on phone: Not on file    Gets together: Not on file    Attends religious service: Not on file    Active member of club or organization: Not on file    Attends meetings of clubs or organizations: Not on file    Relationship status: Not on file  Other Topics Concern  . Not on file  Social History Narrative   Married, 2 children, mother in law lives with them, was working as a Print production planner Social History:    Allergies:   Allergies  Allergen Reactions  . Paliperidone Other (See Comments)    Angioedema, drooling, slurred speech, ptosis  . Sulfa Antibiotics Other (See Comments)    unknown  . Lisinopril Rash    Angioedema     Labs:  No results found for this or any previous visit (from the past 48 hour(s)).  Current Facility-Administered Medications  Medication Dose Route Frequency Provider Last Rate Last Dose  . acetaminophen (TYLENOL) tablet 650 mg  650 mg Oral Q6H PRN Oralia Manis, MD       Or  . acetaminophen (TYLENOL) suppository 650 mg  650 mg Rectal Q6H PRN Oralia Manis, MD      . amLODipine (NORVASC) tablet 5 mg  5 mg Oral Daily Oralia Manis, MD   5 mg at 08/12/18 1037  .  dextrose 5 % and 0.45 % NaCl with KCl 20 mEq/L infusion   Intravenous Continuous Arnaldo Natal, MD 100 mL/hr at 08/12/18 1301    . enoxaparin (LOVENOX) injection 40 mg  40 mg Subcutaneous Q24H Oralia Manis, MD   40 mg at 08/11/18 2013  . feeding supplement (ENSURE ENLIVE) (ENSURE ENLIVE) liquid 237 mL  237 mL Oral BID BM Houston Siren, MD   237 mL at 08/11/18 1514  . FLUoxetine (PROZAC) capsule 40 mg  40 mg Oral Daily Clapacs, Jackquline Denmark, MD   40 mg at 08/12/18  1038  . mirtazapine (REMERON SOL-TAB) disintegrating tablet 15 mg  15 mg Oral QHS Joshuah Minella R, DO      . OLANZapine zydis (ZYPREXA) disintegrating tablet 15 mg  15 mg Oral QHS Ina Poupard R, DO      . ondansetron [facility]) tablet 4 mg  4 mg Oral Q6H PRN Oralia Manis, MD       Or  . ondansetron [facility]) injection 4 mg  4 mg Intravenous Q6H PRN Oralia Manis, MD      . sodium chloride flush (NS) 0.9 % injection 10-40 mL  10-40 mL Intracatheter PRN Cherlynn Kaiser, Rolly Pancake, MD        Musculoskeletal: Strength & Muscle Tone: decreased Gait & Station: unsteady Patient leans: N/A  Psychiatric Specialty Exam: Physical Exam  Nursing note and vitals reviewed. Constitutional: She is oriented to person, place, and time. She appears well-developed.  HENT:  Head: Normocephalic and atraumatic.  Eyes: EOM are normal.  Respiratory: Effort normal.  Musculoskeletal:     Comments: Weak and appears to be generally wasted  Neurological: She is alert and oriented to person, place, and time.  Skin: Skin is warm and dry.  Psychiatric: Her affect is blunt and inappropriate. She is slowed and withdrawn. She is not agitated and not aggressive. Thought content is paranoid and delusional. Cognition and memory are impaired. She expresses inappropriate judgment. She is noncommunicative.    Review of Systems  Unable to perform ROS: Psychiatric disorder  Constitutional: Negative.   HENT: Negative.   Eyes: Negative.   Respiratory: Negative.    Cardiovascular: Negative.   Gastrointestinal: Negative.   Musculoskeletal: Negative.   Skin: Negative.   Neurological: Negative.   Psychiatric/Behavioral: Negative for depression, hallucinations, memory loss, substance abuse and suicidal ideas. The patient is not nervous/anxious and does not have insomnia.     Blood pressure (!) 141/82, pulse 67, temperature (!) 97.3 F (36.3 C), temperature source Oral, resp. rate 18, height  (1.778 m), weight 72.9 kg, SpO2 100 %.Body mass index is 23.06 kg/m.  General Appearance: Disheveled  Eye Contact:  Minimal  Speech:  Slow and Soft volume  Volume:  Decreased  Mood:  "I do not know"  Affect:  Flat  Thought Process: Briefly linear, with perseveration on themes of worthlessness/and catastrophization  Orientation:  Other:  Person place time but not situation  Thought Content:  Illogical, Rumination and Tangential  Suicidal Thoughts:  No  Homicidal Thoughts:  No  Memory:  Immediate;   Fair Recent;   Poor Remote;   Poor  Judgement:  Impaired  Insight:  Shallow  Psychomotor Activity:  Decreased  Concentration:  Concentration: Poor  Recall:  Poor  Fund of Knowledge:  Poor  Language:  Poor  Akathisia:  No  Handed:  Right  AIMS (if indicated):     Assets:  Housing Physical Health Resilience Social Support  ADL's:  Impaired  Cognition:  Impaired,  Moderate and Severe  Sleep:        Treatment Plan Summary: Daily contact with the patient to assess and evaluate symptoms and progress in treatment, medication management and plan.  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  Without treatment there is a significant risk of morbidity and mortality.    The patient was recently transferred from [facility] to [facility] for the initiation of ECT, however that appears to have been halted per the notes by the family.  The patient may  require an ethics consultation, along with the involvement of risk management as if the patient does not respond to medication interventions, not utilizing procedural psychiatric methods may lead to a decline in her condition and she may suffer harm.   Continue Prozac 40 mg p.o. daily Increase olanzapine Zydis 15 mg p.o. nightly Start Remeron melting tablet 15 mg p.o. q. nightly  Considering her somewhat ambiguous reaction to Ativan, we will not schedule it for the time being  Considering her poor intake would recommend placing an NG tube with Jevity feeds  Disposition:  The patient would benefit from ECT at this time however this may be delayed now that the family is refusing.  The patient would warrant inpatient psychiatric hospitalization however at this time she is requiring IV fluids and requires assistance with ambulation   Luciano CutterJustin R Hilliard Borges, DO 08/12/2018 2:29 PM

## 2018-08-12 NOTE — Progress Notes (Addendum)
[employer] [facility] - [place] at [facility]     PATIENT NAME: Amy Casey    MR#:  [id number]  DATE OF BIRTH:  September 18, 1960  SUBJECTIVE:   Patient's family refusing ECT.  No other acute events overnight.  P.o. intake remains very poor and patient refuses to eat.  Patient is ambulatory.  Sitter is at bedside.  REVIEW OF SYSTEMS:    Review of Systems  Constitutional: Negative for chills and fever.  HENT: Negative for congestion and tinnitus.   Eyes: Negative for blurred vision and double vision.  Respiratory: Negative for cough, shortness of breath and wheezing.   Cardiovascular: Negative for chest pain, orthopnea and PND.  Gastrointestinal: Negative for abdominal pain, diarrhea, nausea and vomiting.  Genitourinary: Negative for dysuria and hematuria.  Neurological: Negative for dizziness, sensory change and focal weakness.  Psychiatric/Behavioral: Positive for depression.  All other systems reviewed and are negative.   Nutrition: Regular Tolerating Diet: Yes Tolerating PT: Ambulatory  DRUG ALLERGIES:   Allergies  Allergen Reactions  . Paliperidone Other (See Comments)    Angioedema, drooling, slurred speech, ptosis  . Sulfa Antibiotics Other (See Comments)    unknown  . Lisinopril Rash    Angioedema     VITALS:  Blood pressure (!) 141/82, pulse 67, temperature (!) 97.3 F (36.3 C), temperature source Oral, resp. rate 18, height 5\' 10"  (1.778 m), weight 72.9 kg, SpO2 100 %.  PHYSICAL EXAMINATION:   Physical Exam  GENERAL:  58 y.o.-year-old patient lying in bed in no acute distress.  EYES: Pupils equal, round, reactive to light and accommodation. No scleral icterus. Extraocular muscles intact.  HEENT: Head atraumatic, normocephalic. Oropharynx and nasopharynx clear.  NECK:  Supple, no jugular venous distention. No thyroid enlargement, no tenderness.  LUNGS: Normal breath sounds bilaterally, no wheezing, rales, rhonchi. No use of accessory muscles of  respiration.  CARDIOVASCULAR: S1, S2 normal. No murmurs, rubs, or gallops.  ABDOMEN: Soft, nontender, nondistended. Bowel sounds present. No organomegaly or mass.  EXTREMITIES: No cyanosis, clubbing or edema b/l.    NEUROLOGIC: Cranial nerves II through XII are intact. No focal Motor or sensory deficits b/l.  PSYCHIATRIC: The patient is alert and oriented x 1. Depressed with flat affect.   SKIN: No obvious rash, lesion, or ulcer.    LABORATORY PANEL:   CBC Recent Labs  Lab 08/10/18 0330  WBC 6.1  HGB 12.9  HCT 39.3  PLT 393   ------------------------------------------------------------------------------------------------------------------  Chemistries  Recent Labs  Lab 08/09/18 0235  08/10/18 0330  NA 138  --  138  K 3.7  --  3.6  CL 103  --  103  CO2 25  --  26  GLUCOSE 117*  --  125*  BUN 18  --  22*  CREATININE 0.98   < > 0.89  CALCIUM 9.4  --  9.2  MG 2.0  --   --    < > = values in this interval not displayed.   ------------------------------------------------------------------------------------------------------------------  Cardiac Enzymes No results for input(s): TROPONINI in the last 168 hours. ------------------------------------------------------------------------------------------------------------------  RADIOLOGY:  No results found.   ASSESSMENT AND PLAN:   58 year old female with past medical history of catatonic schizophrenia, hypertension, depression who presented to the hospital for treatment of her underlying schizophrenia/depression with ECT.  1.  Schizophrenia/depression- patient's husband is refusing ECT. - Discussed with psychiatrist on call and plan to increase Zyprexa, add Remeron at bedtime.  Patient also given some Ativan today to see if that would improve her  catatonia -Continue further care as per psychiatry. - Patient is refusing to eat which is related to underlying depression.  Psychiatry is possibly considering Dobbhoff/NG tube  placement for enteral feeds.  2.  Essential hypertension-blood pressure stable. -Continue Norvasc.  3.  Depression-continue Prozac.  If pt. Can eat something and she is already ambulatory then possible discharge to inpatient Psych.    All the records are reviewed and case discussed with Care Management/Social Worker. Management plans discussed with the patient, family and they are in agreement.  CODE STATUS: Full code  DVT Prophylaxis: Lovenox  TOTAL TIME TAKING CARE OF THIS PATIENT: 30 minutes.   POSSIBLE D/C IN 3-4 DAYS, DEPENDING ON CLINICAL CONDITION.   Houston SirenVivek J Ronna Herskowitz M.D on 08/12/2018 at 3:02 PM  Between 7am to 6pm - Pager - [phone]  After 6pm go to [email or web address] - Social research officer, governmentpassword EPAS [email or web address]  [facility] [place] [facility]  Office  [phone]  CC: Primary care physician; Jac Canavanysinger, David S, PA-C

## 2018-08-13 ENCOUNTER — Encounter: Payer: Self-pay | Admitting: Anesthesiology

## 2018-08-13 DIAGNOSIS — I10 Essential (primary) hypertension: Secondary | ICD-10-CM

## 2018-08-13 DIAGNOSIS — R5381 Other malaise: Secondary | ICD-10-CM

## 2018-08-13 DIAGNOSIS — F22 Delusional disorders: Secondary | ICD-10-CM

## 2018-08-13 DIAGNOSIS — F333 Major depressive disorder, recurrent, severe with psychotic symptoms: Secondary | ICD-10-CM

## 2018-08-13 LAB — BASIC METABOLIC PANEL
Anion gap: 7 (ref 5–15)
BUN: 9 mg/dL (ref 6–20)
CO2: 24 mmol/L (ref 22–32)
Calcium: 9.3 mg/dL (ref 8.9–10.3)
Chloride: 113 mmol/L — ABNORMAL HIGH (ref 98–111)
Creatinine, Ser: 0.61 mg/dL (ref 0.44–1.00)
GFR calc Af Amer: >60 mL/min (ref >60)
GFR calc non Af Amer: >60 mL/min (ref >60)
Glucose, Bld: 106 mg/dL — ABNORMAL HIGH (ref 70–99)
Potassium: 4.1 mmol/L (ref 3.5–5.1)
Sodium: 144 mmol/L (ref 135–145)

## 2018-08-13 MED ORDER — FLUOXETINE HCL 20 MG PO CAPS
60.0000 mg | ORAL_CAPSULE | Freq: Every day | ORAL | Status: DC
  Administered 2018-08-14 – 2018-08-15 (×2): 60 mg via ORAL
  Filled 2018-08-13 (×2): qty 3

## 2018-08-13 MED ORDER — FLUOXETINE HCL 20 MG PO CAPS
20.0000 mg | ORAL_CAPSULE | Freq: Every day | ORAL | Status: AC
  Administered 2018-08-13: 15:00:00 20 mg via ORAL
  Filled 2018-08-13 (×2): qty 1

## 2018-08-13 MED ORDER — MIRTAZAPINE 15 MG PO TBDP
7.5000 mg | ORAL_TABLET | Freq: Every day | ORAL | Status: DC
  Administered 2018-08-13 – 2018-08-14 (×2): 7.5 mg via ORAL
  Filled 2018-08-13 (×3): qty 0.5

## 2018-08-13 MED ORDER — OLANZAPINE 5 MG PO TBDP
2.5000 mg | ORAL_TABLET | Freq: Two times a day (BID) | ORAL | Status: DC
  Administered 2018-08-13 – 2018-08-15 (×4): 2.5 mg via ORAL
  Filled 2018-08-13 (×5): qty 0.5

## 2018-08-13 NOTE — Progress Notes (Signed)
[employer] - [place] at [facility]     PATIENT NAME: Amy Casey    MR#:  [id number]  DATE OF BIRTH:  03/03/1961  SUBJECTIVE:   She was still refusing to eat.  His husband is refusing ECT.  Discussed with psychiatry and they are possibly trying to get the patient transferred downstairs to behavioral medicine as long as the patient can ambulate and eat.  REVIEW OF SYSTEMS:    Review of Systems  Constitutional: Negative for chills and fever.  HENT: Negative for congestion and tinnitus.   Eyes: Negative for blurred vision and double vision.  Respiratory: Negative for cough, shortness of breath and wheezing.   Cardiovascular: Negative for chest pain, orthopnea and PND.  Gastrointestinal: Negative for abdominal pain, diarrhea, nausea and vomiting.  Genitourinary: Negative for dysuria and hematuria.  Neurological: Negative for dizziness, sensory change and focal weakness.  Psychiatric/Behavioral: Positive for depression.  All other systems reviewed and are negative.   Nutrition: Regular Tolerating Diet: No Tolerating PT: Ambulatory  DRUG ALLERGIES:   Allergies  Allergen Reactions  . Paliperidone Other (See Comments)    Angioedema, drooling, slurred speech, ptosis  . Sulfa Antibiotics Other (See Comments)    unknown  . Lisinopril Rash    Angioedema     VITALS:  Blood pressure (!) 160/75, pulse 79, temperature 97.6 F (36.4 C), temperature source Oral, resp. rate 18, height 5\' 10"  (1.778 m), weight 72.9 kg, SpO2 100 %.  PHYSICAL EXAMINATION:   Physical Exam  GENERAL:  58 y.o.-year-old patient lying in bed in no acute distress.  EYES: Pupils equal, round, reactive to light and accommodation. No scleral icterus. Extraocular muscles intact.  HEENT: Head atraumatic, normocephalic. Oropharynx and nasopharynx clear.  NECK:  Supple, no jugular venous distention. No thyroid enlargement, no tenderness.  LUNGS: Normal breath sounds bilaterally, no wheezing,  rales, rhonchi. No use of accessory muscles of respiration.  CARDIOVASCULAR: S1, S2 normal. No murmurs, rubs, or gallops.  ABDOMEN: Soft, nontender, nondistended. Bowel sounds present. No organomegaly or mass.  EXTREMITIES: No cyanosis, clubbing or edema b/l.    NEUROLOGIC: Cranial nerves II through XII are intact. No focal Motor or sensory deficits b/l.  PSYCHIATRIC: The patient is alert and oriented x 3. Depressed with flat affect.   SKIN: No obvious rash, lesion, or ulcer.    LABORATORY PANEL:   CBC Recent Labs  Lab 08/10/18 0330  WBC 6.1  HGB 12.9  HCT 39.3  PLT 393   ------------------------------------------------------------------------------------------------------------------  Chemistries  Recent Labs  Lab 08/09/18 0235  08/13/18 0511  NA 138   < > 144  K 3.7   < > 4.1  CL 103   < > 113*  CO2 25   < > 24  GLUCOSE 117*   < > 106*  BUN 18   < > 9  CREATININE 0.98   < > 0.61  CALCIUM 9.4   < > 9.3  MG 2.0  --   --    < > = values in this interval not displayed.   ------------------------------------------------------------------------------------------------------------------  Cardiac Enzymes No results for input(s): TROPONINI in the last 168 hours. ------------------------------------------------------------------------------------------------------------------  RADIOLOGY:  No results found.   ASSESSMENT AND PLAN:   58 year old female with past medical history of catatonic schizophrenia, hypertension, depression who presented to the hospital for treatment of her underlying schizophrenia/depression with ECT.  1.  Schizophrenia/depression- patient's husband continues to refuse ECT. -Patient is still refusing to eat and psychiatry is possibly considering Dobbhoff/NG tube  feeds. -Electrolytes and renal function stable. -Continue Zyprexa, Remeron and further care as per psychiatry.  Patient's catatonia did not improve much with the Ativan she received  yesterday.  2.  Essential hypertension-blood pressure stable. -Continue Norvasc.  3.  Depression-continue Prozac.   All the records are reviewed and case discussed with Care Management/Social Worker. Management plans discussed with the patient, family and they are in agreement.  CODE STATUS: Full code  DVT Prophylaxis: Lovenox  TOTAL TIME TAKING CARE OF THIS PATIENT: 30 minutes.   POSSIBLE D/C unclear DAYS, DEPENDING ON CLINICAL CONDITION and progress   Houston Siren M.D on 08/13/2018 at 12:38 PM  Between 7am to 6pm - Pager - [phone]  After 6pm go to [email or web address] - Social research officer, government  [facility] [place] [facility]  Office  [phone]  CC: Primary care physician; Jac Canavan, PA-C

## 2018-08-13 NOTE — Progress Notes (Signed)
Per MD okay for RN to start pt in a regular diet,
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
## 2018-08-14 MED ORDER — OLANZAPINE 10 MG PO TBDP
20.0000 mg | ORAL_TABLET | Freq: Every day | ORAL | Status: DC
  Administered 2018-08-14: 20 mg via ORAL
  Filled 2018-08-14 (×2): qty 2

## 2018-08-14 NOTE — Progress Notes (Signed)
Physical Therapy Treatment Patient Details Name: Amy Casey MRN: [id number] DOB: November 14, 1960 Today's Date: 08/14/2018    History of Present Illness 58 yo Female was admitted to [facility] with AMS; She has a history of paranoid schizophrenia and has been in a catatonic state, unwilling to eat or walk. She was transferred to [facility] with the plan for possible ECT treatment on Monday 08/13/18. PMH significant: depression, HTN, hypokalemia;     PT Comments    Patient eager to mobilize to use commode, impulsively mobilized to EOB mod I. Pt performed transfers with RW CGA/supervision, from bed and standard commode. Pt consistently provided with verbal and tactile cues for proper RW management. Pt ambulatedwith RW with CGA/supervision ~64ft this session, exhibited anxiety, but overall steady. In bed at end of session, all needs in reach with sitter at bedside. Pt refusing all food/drink from PT at this time.     Follow Up Recommendations  Home health PT;Supervision/Assistance - 24 hour     Equipment Recommendations  Rolling walker with 5" wheels    Recommendations for Other Services       Precautions / Restrictions Precautions Precautions: Fall Restrictions Weight Bearing Restrictions: No    Mobility  Bed Mobility Overal bed mobility: Modified Independent Bed Mobility: Supine to Sit;Sit to Supine     Supine to sit: HOB elevated;Modified independent (Device/Increase time) Sit to supine: Modified independent (Device/Increase time)      Transfers Overall transfer level: Needs assistance Equipment used: Rolling walker (2 wheeled) Transfers: Sit to/from Stand Sit to Stand: Min guard;Supervision         General transfer comment: Transfer quickly, constant cues for proper AD management. Able to perform standard commode transfer with supervision as well, use of grab bar  Ambulation/Gait Ambulation/Gait assistance: Supervision;Min guard Gait Distance (Feet): 80 Feet Assistive  device: Rolling walker (2 wheeled) Gait Pattern/deviations: Step-through pattern;Decreased step length - right;Decreased step length - left;Narrow base of support     General Gait Details: Pt needs verbal/tactile cues for RW management. reaches for support when not utilizing RW, reported feeling anxious while ambulating   Stairs             Wheelchair Mobility    Modified Rankin (Stroke Patients Only)       Balance Overall balance assessment: Needs assistance Sitting-balance support: Feet supported;No upper extremity supported Sitting balance-Leahy Scale: Fair       Standing balance-Leahy Scale: Fair Standing balance comment: able to wash hands at sink, no UE supported                            Cognition Arousal/Alertness: Awake/alert Behavior During Therapy: Flat affect;Anxious;Impulsive Overall Cognitive Status: No family/caregiver present to determine baseline cognitive functioning Area of Impairment: Safety/judgement;Following commands;Problem solving                       Following Commands: Follows one step commands consistently Safety/Judgement: Decreased awareness of safety   Problem Solving: Slow processing;Requires verbal cues        Exercises      General Comments        Pertinent Vitals/Pain Pain Assessment: Faces Faces Pain Scale: No hurt    Home Living                      Prior Function            PT Goals (current goals can now be  found in the care plan section) Progress towards PT goals: Progressing toward goals    Frequency    Min 2X/week      PT Plan Discharge plan needs to be updated    Co-evaluation              AM-PAC PT "6 Clicks" Mobility   Outcome Measure  Help needed turning from your back to your side while in a flat bed without using bedrails?: None Help needed moving from lying on your back to sitting on the side of a flat bed without using bedrails?: None Help needed  moving to and from a bed to a chair (including a wheelchair)?: A Little Help needed standing up from a chair using your arms (e.g., wheelchair or bedside chair)?: None Help needed to walk in hospital room?: A Little Help needed climbing 3-5 steps with a railing? : A Little 6 Click Score: 21    End of Session Equipment Utilized During Treatment: Gait belt Activity Tolerance: Patient tolerated treatment well Patient left: with call bell/phone within reach;with nursing/sitter in room;in bed Nurse Communication: Mobility status PT Visit Diagnosis: Unsteadiness on feet (R26.81);Other abnormalities of gait and mobility (R26.89);Muscle weakness (generalized) (M62.81);Difficulty in walking, not elsewhere classified (R26.2)     Time: [id number] PT Time Calculation (min) (ACUTE ONLY): 37 min  Charges:  $Therapeutic Exercise: 23-37 mins                     Olga Coasteriana Shuntel Fishburn PT, DPT 11:55 AM,08/14/18 [phone]
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
## 2018-08-14 NOTE — Progress Notes (Signed)
[facility] [place] at [facility]     PATIENT NAME: Amy Casey    MR#:  [id number]  DATE OF BIRTH:  08/05/1960  SUBJECTIVE:   Patient is eating some crackers and drinking some water.  She also ate some eggs this morning.  Still has a flat affect.  No episodes of agitation.  She denies any suicidal or homicidal ideations or hallucinations.  REVIEW OF SYSTEMS:    Review of Systems  Constitutional: Negative for chills and fever.  HENT: Negative for congestion and tinnitus.   Eyes: Negative for blurred vision and double vision.  Respiratory: Negative for cough, shortness of breath and wheezing.   Cardiovascular: Negative for chest pain, orthopnea and PND.  Gastrointestinal: Negative for abdominal pain, diarrhea, nausea and vomiting.  Genitourinary: Negative for dysuria and hematuria.  Neurological: Negative for dizziness, sensory change and focal weakness.  Psychiatric/Behavioral: Positive for depression.  All other systems reviewed and are negative.   Nutrition: Regular Tolerating Diet: yes some Tolerating PT: Ambulatory  DRUG ALLERGIES:   Allergies  Allergen Reactions  . Paliperidone Other (See Comments)    Angioedema, drooling, slurred speech, ptosis  . Sulfa Antibiotics Other (See Comments)    unknown  . Lisinopril Rash    Angioedema     VITALS:  Blood pressure 128/87, pulse 80, temperature 98.2 F (36.8 C), temperature source Oral, resp. rate 20, height 5\' 10"  (1.778 m), weight 74.6 kg, SpO2 98 %.  PHYSICAL EXAMINATION:   Physical Exam  GENERAL:  58 y.o.-year-old patient lying in bed in no acute distress.  EYES: Pupils equal, round, reactive to light and accommodation. No scleral icterus. Extraocular muscles intact.  HEENT: Head atraumatic, normocephalic. Oropharynx and nasopharynx clear.  NECK:  Supple, no jugular venous distention. No thyroid enlargement, no tenderness.  LUNGS: Normal breath sounds bilaterally, no wheezing, rales,  rhonchi. No use of accessory muscles of respiration.  CARDIOVASCULAR: S1, S2 normal. No murmurs, rubs, or gallops.  ABDOMEN: Soft, nontender, nondistended. Bowel sounds present. No organomegaly or mass.  EXTREMITIES: No cyanosis, clubbing or edema b/l.    NEUROLOGIC: Cranial nerves II through XII are intact. No focal Motor or sensory deficits b/l.  PSYCHIATRIC: The patient is alert and oriented x 3. Depressed with flat affect.   SKIN: No obvious rash, lesion, or ulcer.    LABORATORY PANEL:   CBC Recent Labs  Lab 08/10/18 0330  WBC 6.1  HGB 12.9  HCT 39.3  PLT 393   ------------------------------------------------------------------------------------------------------------------  Chemistries  Recent Labs  Lab 08/09/18 0235  08/13/18 0511  NA 138   < > 144  K 3.7   < > 4.1  CL 103   < > 113*  CO2 25   < > 24  GLUCOSE 117*   < > 106*  BUN 18   < > 9  CREATININE 0.98   < > 0.61  CALCIUM 9.4   < > 9.3  MG 2.0  --   --    < > = values in this interval not displayed.   ------------------------------------------------------------------------------------------------------------------  Cardiac Enzymes No results for input(s): TROPONINI in the last 168 hours. ------------------------------------------------------------------------------------------------------------------  RADIOLOGY:  No results found.   ASSESSMENT AND PLAN:   58 year old female with past medical history of catatonic schizophrenia, hypertension, depression who presented to the hospital for treatment of her underlying schizophrenia/depression with ECT.  1.  Schizophrenia/depression- patient's husband continues to refuse ECT. -Patient is eating some today and she ate some crackers and some eggs this  morning.  She continues to drink fluids. -I will DC her IV fluids, hold off on other enteral feedings for now.  If electrolytes remain stable tomorrow then discussed with psychiatry that she can possibly transfer  down stairs for inpatient psychiatric care. -Continue Zyprexa, Remeron and further care as per psychiatry.    2.  Essential hypertension-blood pressure stable. -Continue Norvasc.  3.  Depression-continue Prozac.   All the records are reviewed and case discussed with Care Management/Social Worker. Management plans discussed with the patient, family and they are in agreement.  CODE STATUS: Full code  DVT Prophylaxis: Lovenox  TOTAL TIME TAKING CARE OF THIS PATIENT: 30 minutes.   We will discharge to inpatient behavioral medicine tomorrow if p.o. intake remains stable and electrolytes are stable tomorrow off IV fluids.   Houston Siren M.D on 08/14/2018 at 1:41 PM  Between 7am to 6pm - Pager - [phone]  After 6pm go to [email or web address] - Social research officer, government  [facility]  Office  [phone]  CC: Primary care physician; Jac Canavan, PA-C

## 2018-08-15 ENCOUNTER — Inpatient Hospital Stay
Admission: AD | Admit: 2018-08-15 | Discharge: 2018-08-20 | DRG: 885 | Disposition: A | Discharge status: Home / Self Care | Payer: BLUE CROSS/BLUE SHIELD | Source: Intra-hospital | Attending: Psychiatry | Admitting: Psychiatry

## 2018-08-15 ENCOUNTER — Other Ambulatory Visit: Payer: Self-pay

## 2018-08-15 DIAGNOSIS — R440 Auditory hallucinations: Secondary | ICD-10-CM | POA: Diagnosis not present

## 2018-08-15 DIAGNOSIS — F203 Undifferentiated schizophrenia: Secondary | ICD-10-CM | POA: Diagnosis not present

## 2018-08-15 DIAGNOSIS — F323 Major depressive disorder, single episode, severe with psychotic features: Secondary | ICD-10-CM | POA: Diagnosis present

## 2018-08-15 DIAGNOSIS — F202 Catatonic schizophrenia: Principal | ICD-10-CM | POA: Diagnosis present

## 2018-08-15 DIAGNOSIS — I10 Essential (primary) hypertension: Secondary | ICD-10-CM | POA: Diagnosis present

## 2018-08-15 DIAGNOSIS — F29 Unspecified psychosis not due to a substance or known physiological condition: Secondary | ICD-10-CM | POA: Diagnosis present

## 2018-08-15 DIAGNOSIS — F209 Schizophrenia, unspecified: Secondary | ICD-10-CM | POA: Diagnosis present

## 2018-08-15 DIAGNOSIS — F333 Major depressive disorder, recurrent, severe with psychotic symptoms: Secondary | ICD-10-CM | POA: Diagnosis not present

## 2018-08-15 DIAGNOSIS — F22 Delusional disorders: Secondary | ICD-10-CM | POA: Diagnosis not present

## 2018-08-15 DIAGNOSIS — R5381 Other malaise: Secondary | ICD-10-CM | POA: Diagnosis not present

## 2018-08-15 DIAGNOSIS — F061 Catatonic disorder due to known physiological condition: Secondary | ICD-10-CM

## 2018-08-15 DIAGNOSIS — F200 Paranoid schizophrenia: Secondary | ICD-10-CM | POA: Diagnosis not present

## 2018-08-15 LAB — BASIC METABOLIC PANEL
Anion gap: 8 (ref 5–15)
BUN: 19 mg/dL (ref 6–20)
CO2: 25 mmol/L (ref 22–32)
Calcium: 8.7 mg/dL — ABNORMAL LOW (ref 8.9–10.3)
Chloride: 110 mmol/L (ref 98–111)
Creatinine, Ser: 0.78 mg/dL (ref 0.44–1.00)
GFR calc Af Amer: >60 mL/min (ref >60)
GFR calc non Af Amer: >60 mL/min (ref >60)
Glucose, Bld: 97 mg/dL (ref 70–99)
Potassium: 3.9 mmol/L (ref 3.5–5.1)
Sodium: 143 mmol/L (ref 135–145)

## 2018-08-15 MED ORDER — FLUOXETINE HCL 20 MG PO CAPS
60.0000 mg | ORAL_CAPSULE | Freq: Every day | ORAL | Status: DC
  Administered 2018-08-16 – 2018-08-20 (×5): 60 mg via ORAL
  Filled 2018-08-15 (×5): qty 3

## 2018-08-15 MED ORDER — AMLODIPINE BESYLATE 5 MG PO TABS
5.0000 mg | ORAL_TABLET | Freq: Every day | ORAL | Status: DC
  Administered 2018-08-16 – 2018-08-19 (×3): 5 mg via ORAL
  Filled 2018-08-15 (×4): qty 1

## 2018-08-15 MED ORDER — MIRTAZAPINE 15 MG PO TBDP
7.5000 mg | ORAL_TABLET | Freq: Every day | ORAL | Status: DC
  Administered 2018-08-15 – 2018-08-16 (×2): 7.5 mg via ORAL
  Filled 2018-08-15 (×3): qty 0.5

## 2018-08-15 MED ORDER — OLANZAPINE 5 MG PO TBDP
2.5000 mg | ORAL_TABLET | Freq: Two times a day (BID) | ORAL | Status: DC
  Administered 2018-08-16 – 2018-08-17 (×3): 2.5 mg via ORAL
  Filled 2018-08-15 (×3): qty 1

## 2018-08-15 MED ORDER — MAGNESIUM HYDROXIDE 400 MG/5ML PO SUSP: 30.0000 mL | Freq: Every day | ORAL | Status: DC | PRN

## 2018-08-15 MED ORDER — ZIPRASIDONE MESYLATE 20 MG IM SOLR: 20.0000 mg | INTRAMUSCULAR | Status: DC | PRN

## 2018-08-15 MED ORDER — LORAZEPAM 1 MG PO TABS: 1.0000 mg | ORAL_TABLET | ORAL | Status: DC | PRN

## 2018-08-15 MED ORDER — ALUM & MAG HYDROXIDE-SIMETH 200-200-20 MG/5ML PO SUSP: 30.0000 mL | ORAL | Status: DC | PRN

## 2018-08-15 MED ORDER — HYDROXYZINE HCL 25 MG PO TABS: 25.0000 mg | ORAL_TABLET | Freq: Four times a day (QID) | ORAL | Status: DC | PRN

## 2018-08-15 MED ORDER — SODIUM CHLORIDE 0.9 % IV SOLN: 500.0000 mL | Freq: Once | INTRAVENOUS | Status: DC

## 2018-08-15 MED ORDER — OLANZAPINE 5 MG PO TBDP: 5.0000 mg | ORAL_TABLET | Freq: Three times a day (TID) | ORAL | Status: DC | PRN

## 2018-08-15 MED ORDER — ACETAMINOPHEN 325 MG PO TABS: 650.0000 mg | ORAL_TABLET | Freq: Four times a day (QID) | ORAL | Status: DC | PRN

## 2018-08-15 MED ORDER — ENSURE ENLIVE PO LIQD: 237.0000 mL | Freq: Two times a day (BID) | ORAL | 12 refills | Status: DC

## 2018-08-15 MED ORDER — ONDANSETRON HCL 4 MG/2ML IJ SOLN
4.0000 mg | Freq: Four times a day (QID) | INTRAMUSCULAR | Status: DC | PRN
  Filled 2018-08-15: qty 2

## 2018-08-15 MED ORDER — ENSURE ENLIVE PO LIQD
237.0000 mL | Freq: Two times a day (BID) | ORAL | Status: DC
  Administered 2018-08-16 – 2018-08-19 (×7): 237 mL via ORAL

## 2018-08-15 MED ORDER — ENOXAPARIN SODIUM 40 MG/0.4ML ~~LOC~~ SOLN
40.0000 mg | SUBCUTANEOUS | Status: DC
  Administered 2018-08-15: 40 mg via SUBCUTANEOUS
  Filled 2018-08-15 (×2): qty 0.4

## 2018-08-15 MED ORDER — DIPHENHYDRAMINE HCL 25 MG PO CAPS: 50.0000 mg | ORAL_CAPSULE | Freq: Every evening | ORAL | Status: DC | PRN

## 2018-08-15 MED ORDER — ACETAMINOPHEN 650 MG RE SUPP
650.0000 mg | Freq: Four times a day (QID) | RECTAL | Status: DC | PRN
  Filled 2018-08-15: qty 1

## 2018-08-15 MED ORDER — MIRTAZAPINE 15 MG PO TBDP: 7.5000 mg | ORAL_TABLET | Freq: Every day | ORAL | Status: DC

## 2018-08-15 MED ORDER — ONDANSETRON HCL 4 MG PO TABS
4.0000 mg | ORAL_TABLET | Freq: Four times a day (QID) | ORAL | Status: DC | PRN
  Filled 2018-08-15: qty 1

## 2018-08-15 MED ORDER — OLANZAPINE 5 MG PO TBDP
20.0000 mg | ORAL_TABLET | Freq: Every day | ORAL | Status: DC
  Administered 2018-08-15 – 2018-08-19 (×5): 20 mg via ORAL
  Filled 2018-08-15 (×5): qty 4

## 2018-08-15 MED ORDER — OLANZAPINE 20 MG PO TBDP: 20.0000 mg | ORAL_TABLET | Freq: Every day | ORAL | Status: DC

## 2018-08-15 MED ORDER — OLANZAPINE 5 MG PO TBDP: 2.5000 mg | ORAL_TABLET | Freq: Two times a day (BID) | ORAL | Status: DC

## 2018-08-15 NOTE — Progress Notes (Signed)
RN called report to behavior med. Report given to [facility].

## 2018-08-15 NOTE — Discharge Summary (Signed)
[facility] [place] [facility]   PATIENT NAME: Amy Casey    MR#:  [id number]  DATE OF BIRTH:  Mar 20, 1961  DATE OF ADMISSION:  08/09/2018 ADMITTING PHYSICIAN: Oralia Manis, MD  DATE OF DISCHARGE: 08/15/2018  PRIMARY CARE PHYSICIAN: Jac Canavan, PA-C    ADMISSION DIAGNOSIS:  CATATONIA  DISCHARGE DIAGNOSIS:  Principal Problem:   Paranoid schizophrenia (HCC) Active Problems:   Essential hypertension, benign   Physical deconditioning   SECONDARY DIAGNOSIS:   Past Medical History:  Diagnosis Date  . Depression    Dr. Mila Homer, [facility]; hospitalization 07/2010  . History of echocardiogram 2011   [facility]  . History of renal stone   . Hypertension   . Hypokalemia   . Obesity   . Schizophrenia (HCC)   . Wears glasses     HOSPITAL COURSE:   58 year old female with past medical history of catatonic schizophrenia, hypertension, depression who presented to the hospital for treatment of her underlying schizophrenia/depression with ECT.  1.  Schizophrenia/depression- patient was transferred from [facility] for possible initiation of ECT but the husband refused and therefore patient was managed medically. -Psychiatry was consulted and patient was maintained on some Prozac, Zyprexa and Remeron. -She was given some IV fluids, p.o. intake was encouraged and she is taking some p.o. and is ambulating with the help for some therapy. -She denies any gross suicidal/homicidal ideations or hallucinations.  Psychiatry has been following the patient and recommended inpatient psychiatry and therefore patient is being transferred to behavioral medicine now. -Patient's electrolytes have been stable despite being off IV fluids.  2.  Essential hypertension-blood pressure stable. -pt. Will Continue Norvasc.  3.  Depression- pt. Will continue Prozac.  DISCHARGE CONDITIONS:   Stable.   CONSULTS OBTAINED:  Treatment Team:  Clapacs, Jackquline Denmark, MD  DRUG  ALLERGIES:   Allergies  Allergen Reactions  . Paliperidone Other (See Comments)    Angioedema, drooling, slurred speech, ptosis  . Sulfa Antibiotics Other (See Comments)    unknown  . Lisinopril Rash    Angioedema     DISCHARGE MEDICATIONS:   Allergies as of 08/15/2018      Reactions   Paliperidone Other (See Comments)   Angioedema, drooling, slurred speech, ptosis   Sulfa Antibiotics Other (See Comments)   unknown   Lisinopril Rash   Angioedema      Medication List    STOP taking these medications   OLANZapine 10 MG tablet Commonly known as:  ZYPREXA Replaced by:  OLANZapine zydis 5 MG disintegrating tablet     TAKE these medications   amLODipine 5 MG tablet Commonly known as:  NORVASC Take 1 tablet (5 mg total) by mouth daily.   benztropine 1 MG tablet Commonly known as:  COGENTIN Take 1 mg by mouth 2 (two) times daily.   feeding supplement (ENSURE ENLIVE) Liqd Take 237 mLs by mouth 2 (two) times daily between meals. Start taking on:  Aug 16, 2018   FLUoxetine 40 MG capsule Commonly known as:  PROZAC Take 1 capsule (40 mg total) by mouth daily.   methocarbamol 500 MG tablet Commonly known as:  ROBAXIN Take 500 mg by mouth daily.   mirtazapine 15 MG disintegrating tablet Commonly known as:  REMERON SOL-TAB Take 0.5 tablets (7.5 mg total) by mouth at bedtime.   OLANZapine zydis 20 MG disintegrating tablet Commonly known as:  ZYPREXA Take 1 tablet (20 mg total) by mouth at bedtime.   OLANZapine zydis 5 MG disintegrating tablet  Commonly known as:  ZYPREXA Take 0.5 tablets (2.5 mg total) by mouth 2 (two) times daily with a meal. Start taking on:  Aug 16, 2018 Replaces:  OLANZapine 10 MG tablet         DISCHARGE INSTRUCTIONS:   DIET:  Regular diet  DISCHARGE CONDITION:  Stable  ACTIVITY:  Activity as tolerated  OXYGEN:  Home Oxygen: No.   Oxygen Delivery: room air  DISCHARGE LOCATION:  Inpatient Behavioral Medicine.    If you  experience worsening of your admission symptoms, develop shortness of breath, life threatening emergency, suicidal or homicidal thoughts you must seek medical attention immediately by calling 911 or calling your MD immediately  if symptoms less severe.  You Must read complete instructions/literature along with all the possible adverse reactions/side effects for all the Medicines you take and that have been prescribed to you. Take any new Medicines after you have completely understood and accpet all the possible adverse reactions/side effects.   Please note  You were cared for by a hospitalist during your hospital stay. If you have any questions about your discharge medications or the care you received while you were in the hospital after you are discharged, you can call the unit and asked to speak with the hospitalist on call if the hospitalist that took care of you is not available. Once you are discharged, your primary care physician will handle any further medical issues. Please note that NO REFILLS for any discharge medications will be authorized once you are discharged, as it is imperative that you return to your primary care physician (or establish a relationship with a primary care physician if you do not have one) for your aftercare needs so that they can reassess your need for medications and monitor your lab values.   DATA REVIEW:   CBC Recent Labs  Lab 08/10/18 0330  WBC 6.1  HGB 12.9  HCT 39.3  PLT 393    Chemistries  Recent Labs  Lab 08/09/18 0235  08/15/18 0424  NA 138   < > 143  K 3.7   < > 3.9  CL 103   < > 110  CO2 25   < > 25  GLUCOSE 117*   < > 97  BUN 18   < > 19  CREATININE 0.98   < > 0.78  CALCIUM 9.4   < > 8.7*  MG 2.0  --   --    < > = values in this interval not displayed.    Cardiac Enzymes No results for input(s): TROPONINI in the last 168 hours.  Microbiology Results  Results for orders placed or performed during the hospital encounter of 07/30/18   SARS Coronavirus 2 [facility](Hospital order, Performed in [facility] hospital lab)     Status: None   Collection Time: 07/30/18 11:11 AM  Result Value Ref Range Status   SARS Coronavirus 2 NEGATIVE NEGATIVE Final    Comment: (NOTE) If result is NEGATIVE SARS-CoV-2 target nucleic acids are NOT DETECTED. The SARS-CoV-2 RNA is generally detectable in upper and lower  respiratory specimens during the acute phase of infection. The lowest  concentration of SARS-CoV-2 viral copies this assay can detect is 250  copies / mL. A negative result does not preclude SARS-CoV-2 infection  and should not be used as the sole basis for treatment or other  patient management decisions.  A negative result may occur with  improper specimen collection / handling, submission of specimen other  than nasopharyngeal swab, presence  of viral mutation(s) within the  areas targeted by this assay, and inadequate number of viral copies  (<250 copies / mL). A negative result must be combined with clinical  observations, patient history, and epidemiological information. If result is POSITIVE SARS-CoV-2 target nucleic acids are DETECTED. The SARS-CoV-2 RNA is generally detectable in upper and lower  respiratory specimens dur ing the acute phase of infection.  Positive  results are indicative of active infection with SARS-CoV-2.  Clinical  correlation with patient history and other diagnostic information is  necessary to determine patient infection status.  Positive results do  not rule out bacterial infection or co-infection with other viruses. If result is PRESUMPTIVE POSTIVE SARS-CoV-2 nucleic acids MAY BE PRESENT.   A presumptive positive result was obtained on the submitted specimen  and confirmed on repeat testing.  While 2019 novel coronavirus  (SARS-CoV-2) nucleic acids may be present in the submitted sample  additional confirmatory testing may be necessary for epidemiological  and / or clinical management purposes   to differentiate between  SARS-CoV-2 and other Sarbecovirus currently known to infect humans.  If clinically indicated additional testing with an alternate test  methodology [phone]) is advised. The SARS-CoV-2 RNA is generally  detectable in upper and lower respiratory sp ecimens during the acute  phase of infection. The expected result is Negative. Fact Sheet for Patients:  BoilerBrush.com.cy Fact Sheet for Healthcare Providers: https://pope.com/ This test is not yet approved or cleared by the [place] FDA and has been authorized for detection and/or diagnosis of SARS-CoV-2 by FDA under an Emergency Use Authorization (EUA).  This EUA will remain in effect (meaning this test can be used) for the duration of the COVID-19 declaration under Section 564(b)(1) of the Act, 21 U.S.C. section 360bbb-3(b)(1), unless the authorization is terminated or revoked sooner. Performed at [facility], [address], [place], [place] [address]   Urine Culture     Status: Abnormal   Collection Time: 07/30/18 11:17 AM  Result Value Ref Range Status   Specimen Description URINE, RANDOM  Final   Special Requests   Final    NONE Performed at [facility], [address], [place], [place] [address]    Culture MULTIPLE SPECIES PRESENT, SUGGEST RECOLLECTION (A)  Final   Report Status 07/31/2018 FINAL  Final  MRSA PCR Screening     Status: None   Collection Time: 07/30/18  6:12 PM  Result Value Ref Range Status   MRSA by PCR NEGATIVE NEGATIVE Final    Comment:        The GeneXpert MRSA Assay (FDA approved for NASAL specimens only), is one component of a comprehensive MRSA colonization surveillance program. It is not intended to diagnose MRSA infection nor to guide or monitor treatment for MRSA infections. Performed at [facility], [address] [place], [place] [address]   Culture, Urine     Status: None   Collection Time:  08/01/18  3:00 PM  Result Value Ref Range Status   Specimen Description URINE, RANDOM  Final   Special Requests NONE  Final   Culture   Final    NO GROWTH Performed at [facility], [address] [place], [place] [address]    Report Status 08/02/2018 FINAL  Final    RADIOLOGY:  No results found.    Management plans discussed with the patient, family and they are in agreement.  CODE STATUS:     Code Status Orders  (From admission,  onward)         Start     Ordered   08/09/18 2309  Full code  Continuous     08/09/18 2314        TOTAL TIME TAKING CARE OF THIS PATIENT: 40 minutes.    Houston Siren M.D on 08/15/2018 at 3:15 PM  Between 7am to 6pm - Pager - [phone]  After 6pm go to [email or web address] - Social research officer, government  [facility]  Office  [phone]  CC: Primary care physician; Jac Canavan, PA-C
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
## 2018-08-15 NOTE — Tx Team (Signed)
Initial Treatment Plan 08/15/2018 5:01 PM Amy Casey BWI:[id number]    PATIENT STRESSORS: Financial difficulties Other: covid 81   PATIENT STRENGTHS: Motivation for treatment/growth Supportive family/friends   PATIENT IDENTIFIED PROBLEMS: Depression   Anxiety                    DISCHARGE CRITERIA:  Ability to meet basic life and health needs Adequate post-discharge living arrangements Motivation to continue treatment in a less acute level of care  PRELIMINARY DISCHARGE PLAN: Outpatient therapy Return to previous work or school arrangements  PATIENT/FAMILY INVOLVEMENT: This treatment plan has been presented to and reviewed with the patient, Amy Casey.  The patient and family have been given the opportunity to ask questions and make suggestions.  Chalmers Cater, RN 08/15/2018, 5:01 PM

## 2018-08-15 NOTE — Plan of Care (Signed)
Pt has been educated on care plan and oriented to the unit. Pt verbalizes understanding. Torrie Mayers RN Problem: Education: Goal: Ability to state activities that reduce stress will improve Outcome: Progressing   Problem: Coping: Goal: Ability to identify and develop effective coping behavior will improve Outcome: Progressing   Problem: Self-Concept: Goal: Ability to identify factors that promote anxiety will improve Outcome: Not Progressing Goal: Level of anxiety will decrease Outcome: Not Progressing Goal: Ability to modify response to factors that promote anxiety will improve Outcome: Not Progressing   Problem: Coping: Goal: Coping ability will improve Outcome: Not Progressing Goal: Will verbalize feelings Outcome: Progressing   Problem: Health Behavior/Discharge Planning: Goal: Ability to make decisions will improve Outcome: Not Progressing Goal: Compliance with therapeutic regimen will improve Outcome: Progressing

## 2018-08-15 NOTE — Progress Notes (Signed)
Pt ambulated in the hall without a walker and she was unsteady she needed to hold to the rails in the wall to walk. Pt ambulated 120 ft. Will continue to assess and monitor pt.

## 2018-08-15 NOTE — TOC Progression Note (Addendum)
Transition of Care [facility]) - Progression Note    Patient Details  Name: Amy Casey MRN: [id number] Date of Birth: 1960-04-28  Transition of Care [facility]) CM/SW Contact  York Spaniel, [place] Phone Number: 08/15/2018, 1:43 PM  Clinical Narrative:   Patient transferred to [facility] for ECT treatments however, patient's husband is refusing the ECT treatments but is willing for her to go to inpatient behavioral med. Patient will need to ambulate without assistance according to psychiatry before patient would be admitted to behavioral unit.         Expected Discharge Plan and Services                                                 Social Determinants of Health (SDOH) Interventions    Readmission Risk Interventions No flowsheet data found.

## 2018-08-15 NOTE — BH Assessment (Signed)
Patient has been accepted to [facility].  Accepting physician is Dr. Viviano Simas.  Attending Physician will be Dr. Toni Amend.  Patient has been assigned to room 301, by [facility] Charge Nurse Neville F.  Call report to [phone].  Representative/Transfer Coordinator is Mariabelen Pressly Patient pre-admitted by [facility] Patient Access Donella Stade.)  2C Staff Lars Mage, RN) made aware of admission.

## 2018-08-15 NOTE — Progress Notes (Signed)
[facility] [place] [facility]     PATIENT NAME: Amy Casey    MR#:  [id number]  DATE OF BIRTH:  1961/03/11  SUBJECTIVE:   Patient's electrolytes are stable despite being off IV fluids.  Her p.o. intake is fair.  Patient ambulated in the hallway today but was unsteady and required a walker to ambulate.  REVIEW OF SYSTEMS:    Review of Systems  Constitutional: Negative for chills and fever.  HENT: Negative for congestion and tinnitus.   Eyes: Negative for blurred vision and double vision.  Respiratory: Negative for cough, shortness of breath and wheezing.   Cardiovascular: Negative for chest pain, orthopnea and PND.  Gastrointestinal: Negative for abdominal pain, diarrhea, nausea and vomiting.  Genitourinary: Negative for dysuria and hematuria.  Neurological: Negative for dizziness, sensory change and focal weakness.  Psychiatric/Behavioral: Positive for depression.  All other systems reviewed and are negative.   Nutrition: Regular Tolerating Diet: yes some Tolerating PT: Ambulatory  DRUG ALLERGIES:   Allergies  Allergen Reactions  . Paliperidone Other (See Comments)    Angioedema, drooling, slurred speech, ptosis  . Sulfa Antibiotics Other (See Comments)    unknown  . Lisinopril Rash    Angioedema     VITALS:  Blood pressure 116/70, pulse 71, temperature 98.5 F (36.9 C), temperature source Oral, resp. rate 18, height 5\' 10"  (1.778 m), weight 74.6 kg, SpO2 100 %.  PHYSICAL EXAMINATION:   Physical Exam  GENERAL:  58 y.o.-year-old patient lying in bed in no acute distress.  EYES: Pupils equal, round, reactive to light and accommodation. No scleral icterus. Extraocular muscles intact.  HEENT: Head atraumatic, normocephalic. Oropharynx and nasopharynx clear.  NECK:  Supple, no jugular venous distention. No thyroid enlargement, no tenderness.  LUNGS: Normal breath sounds bilaterally, no wheezing, rales, rhonchi. No use of accessory muscles  of respiration.  CARDIOVASCULAR: S1, S2 normal. No murmurs, rubs, or gallops.  ABDOMEN: Soft, nontender, nondistended. Bowel sounds present. No organomegaly or mass.  EXTREMITIES: No cyanosis, clubbing or edema b/l.    NEUROLOGIC: Cranial nerves II through XII are intact. No focal Motor or sensory deficits b/l.  PSYCHIATRIC: The patient is alert and oriented x 3. Depressed with flat affect.   SKIN: No obvious rash, lesion, or ulcer.    LABORATORY PANEL:   CBC Recent Labs  Lab 08/10/18 0330  WBC 6.1  HGB 12.9  HCT 39.3  PLT 393   ------------------------------------------------------------------------------------------------------------------  Chemistries  Recent Labs  Lab 08/09/18 0235  08/15/18 0424  NA 138   < > 143  K 3.7   < > 3.9  CL 103   < > 110  CO2 25   < > 25  GLUCOSE 117*   < > 97  BUN 18   < > 19  CREATININE 0.98   < > 0.78  CALCIUM 9.4   < > 8.7*  MG 2.0  --   --    < > = values in this interval not displayed.   ------------------------------------------------------------------------------------------------------------------  Cardiac Enzymes No results for input(s): TROPONINI in the last 168 hours. ------------------------------------------------------------------------------------------------------------------  RADIOLOGY:  No results found.   ASSESSMENT AND PLAN:   58 year old female with past medical history of catatonic schizophrenia, hypertension, depression who presented to the hospital for treatment of her underlying schizophrenia/depression with ECT.  1.  Schizophrenia/depression- patient's husband continues to refuse ECT. -Patient is taking some PO. Electrolytes are stable. Off IV fluids since yesterday.  -Ambulated with nurse today and remains unsteady  on her feet and requires a walker to ambulate. -Continue Zyprexa, Remeron and further care as per psychiatry.   - Await further psych input regarding possible transfer to inpatient  psychiatry.  2.  Essential hypertension-blood pressure stable. -Continue Norvasc.  3.  Depression-continue Prozac.   All the records are reviewed and case discussed with Care Management/Social Worker. Management plans discussed with the patient, family and they are in agreement.  CODE STATUS: Full code  DVT Prophylaxis: Lovenox  TOTAL TIME TAKING CARE OF THIS PATIENT: 30 minutes.   Possible discharge to inpatient behavioral medicine if patient is able to ambulate without assistance.  Houston SirenVivek J Zhanae Proffit M.D on 08/15/2018 at 1:36 PM  Between 7am to 6pm - Pager - [phone]  After 6pm go to [email or web address] - Social research officer, governmentpassword EPAS [facility]  [facility] [place] [facility]  Office  [phone]  CC: Primary care physician; Jac Canavanysinger, David S, PA-C

## 2018-08-15 NOTE — Progress Notes (Signed)
Amy Casey  A and O x 4. VSS. Pt tolerating diet well. No complaints of pain or nausea. IV removed intact. Pt discharged via wheelchair to behavior med.    Allergies as of 08/15/2018      Reactions   Paliperidone Other (See Comments)   Angioedema, drooling, slurred speech, ptosis   Sulfa Antibiotics Other (See Comments)   unknown   Lisinopril Rash   Angioedema      Medication List    STOP taking these medications   OLANZapine 10 MG tablet Commonly known as:  ZYPREXA Replaced by:  OLANZapine zydis 5 MG disintegrating tablet     TAKE these medications   amLODipine 5 MG tablet Commonly known as:  NORVASC Take 1 tablet (5 mg total) by mouth daily.   benztropine 1 MG tablet Commonly known as:  COGENTIN Take 1 mg by mouth 2 (two) times daily.   feeding supplement (ENSURE ENLIVE) Liqd Take 237 mLs by mouth 2 (two) times daily between meals. Start taking on:  Aug 16, 2018   FLUoxetine 40 MG capsule Commonly known as:  PROZAC Take 1 capsule (40 mg total) by mouth daily.   methocarbamol 500 MG tablet Commonly known as:  ROBAXIN Take 500 mg by mouth daily.   mirtazapine 15 MG disintegrating tablet Commonly known as:  REMERON SOL-TAB Take 0.5 tablets (7.5 mg total) by mouth at bedtime.   OLANZapine zydis 20 MG disintegrating tablet Commonly known as:  ZYPREXA Take 1 tablet (20 mg total) by mouth at bedtime.   OLANZapine zydis 5 MG disintegrating tablet Commonly known as:  ZYPREXA Take 0.5 tablets (2.5 mg total) by mouth 2 (two) times daily with a meal. Start taking on:  Aug 16, 2018 Replaces:  OLANZapine 10 MG tablet       Vitals:   08/15/18 0843 08/15/18 1125  BP: (!) 151/78 116/70  Pulse: 67 71  Resp: 18 18  Temp: 98.5 F (36.9 C) 98.5 F (36.9 C)  SpO2: 100% 100%    Suzzanne Cloud

## 2018-08-16 DIAGNOSIS — F203 Undifferentiated schizophrenia: Secondary | ICD-10-CM

## 2018-08-16 DIAGNOSIS — F061 Catatonic disorder due to known physiological condition: Secondary | ICD-10-CM

## 2018-08-16 LAB — CREATININE, SERUM
Creatinine, Ser: 0.79 mg/dL (ref 0.44–1.00)
GFR calc Af Amer: >60 mL/min (ref >60)
GFR calc non Af Amer: >60 mL/min (ref >60)

## 2018-08-16 MED ORDER — ADULT MULTIVITAMIN W/MINERALS CH
1.0000 | ORAL_TABLET | Freq: Every day | ORAL | Status: DC
  Administered 2018-08-17 – 2018-08-20 (×4): 1 via ORAL
  Filled 2018-08-16 (×4): qty 1

## 2018-08-16 NOTE — Progress Notes (Signed)
Nutrition Brief Note  Patient identified on the Malnutrition Screening Tool (MST) Report  58 year old female with past medical history of catatonic schizophrenia, hypertension, depression who presented to the hospital for treatment of her underlying schizophrenia/depression with ECT.  Pt with poor appetite and oral intake for first week or so of her admit to the hospital. Pt's appetite and oral intake is improving now; pt eating 100% of meals and drinking Ensure. Recommend continue supplements and MVI. Per chart, pt appears fairly weight stable at this time. No nutrition interventions warranted at this time.   Wt Readings from Last 15 Encounters:  08/15/18 74.6 kg  08/14/18 74.6 kg  07/30/18 77.1 kg  03/03/18 77.1 kg  07/24/17 82.6 kg  07/18/17 78.9 kg  07/10/17 77.1 kg  12/31/16 77.6 kg  12/01/16 77.1 kg  07/25/16 80 kg  06/02/16 79.5 kg  07/31/15 79.8 kg  07/24/15 81.1 kg  10/02/14 75.3 kg  02/20/14 78.6 kg    Body mass index is 23.6 kg/m. Patient meets criteria for normal weight based on current BMI.   Current diet order is regular, patient is consuming approximately 100% of meals at this time. Labs and medications reviewed.   No nutrition interventions warranted at this time. If nutrition issues arise, please consult RD.   Betsey Holiday MS, RD, LDN Pager #- [phone] Office#- [phone] After Hours Pager: [phone]

## 2018-08-16 NOTE — Progress Notes (Signed)
Recreation Therapy Notes  Date: 08/16/2018   Time: 9:30 am   Location: Craft room   Behavioral response: N/A   Intervention Topic: Decision Making  Discussion/Intervention: Patient did not attend group.   Clinical Observations/Feedback:  Patient did not attend group.   Lautaro Koral LRT/CTRS          Mitchelle Sultan 08/16/2018 10:30 AM

## 2018-08-16 NOTE — Plan of Care (Signed)
D: Pt present and active on unit. Pt is interacting appropriately with peers on unit. Pt denies HI, SI, AVH and does not appear to be responding to internal stimuli. Pt is flat, but brightens with interactions. During conversation, pt is somewhat confused and told this Clinical research associate to "say hello to your mama for her". Otherwise patient is alert and oriented. Patient denies pain or other issues. No distress is noted. A: Encouragement is offered. Patient medications are offered per provider order. Patient monitored for safety per provider order.  R: Pt is safe on unit. Will continue to monitor.   Problem: Education: Goal: Ability to state activities that reduce stress will improve Outcome: Progressing   Problem: Coping: Goal: Ability to identify and develop effective coping behavior will improve Outcome: Progressing   Problem: Self-Concept: Goal: Ability to identify factors that promote anxiety will improve Outcome: Progressing Goal: Level of anxiety will decrease Outcome: Progressing Goal: Ability to modify response to factors that promote anxiety will improve Outcome: Progressing   Problem: Coping: Goal: Coping ability will improve Outcome: Progressing Goal: Will verbalize feelings Outcome: Progressing   Problem: Health Behavior/Discharge Planning: Goal: Ability to make decisions will improve Outcome: Progressing Goal: Compliance with therapeutic regimen will improve Outcome: Progressing

## 2018-08-16 NOTE — [facility] Suicide Risk Assessment (Signed)
[facility]   Nursing information obtained from:    Demographic factors:  Low socioeconomic status, Caucasian Current Mental Status:  NA Loss Factors:  Financial problems / change in socioeconomic status(loss of mom ) Historical Factors:  Anniversary of important loss(May 8th loss of mother -five years ago ) Risk Reduction Factors:  Employed, Positive social support  Total Time spent with patient: 1 hour Principal Problem: Schizophrenia (HCC) Diagnosis:  Principal Problem:   Schizophrenia (HCC) Active Problems:   Essential hypertension, benign   Psychogenic depressive psychosis (HCC)   Acute psychosis (HCC)   Catatonia  Subjective Data: Patient seen and chart reviewed.  Patient with a history of chronic recurrent mental health problems is transferred from the medical service.  She had been there for several days recovering from catatonia.  On interview today the patient denies suicidal or homicidal thoughts although she admits she is still having auditory hallucinations and still has believes that somehow she is going to be harmed.  She mentions several times during the interview that she did not think she would "make it" through the weekend.  No specific physical complaints.  No complaints about medicine side effects.  Continued Clinical Symptoms:  Alcohol Use Disorder Identification Test Final Score (AUDIT): 0 The "Alcohol Use Disorders Identification Test", Guidelines for Use in Primary Care, Second Edition.  World Science writerHealth Organization [facility](WHO). Score between 0-7:  no or low risk or alcohol related problems. Score between 8-15:  moderate risk of alcohol related problems. Score between 16-19:  high risk of alcohol related problems. Score 20 or above:  warrants further diagnostic evaluation for alcohol dependence and treatment.   CLINICAL FACTORS:   Schizophrenia:   Depressive state   Musculoskeletal: Strength & Muscle Tone: within normal limits Gait &  Station: normal Patient leans: N/A  Psychiatric Specialty Exam: Physical Exam  Nursing note and vitals reviewed. Constitutional: She appears well-developed and well-nourished.  HENT:  Head: Normocephalic and atraumatic.  Eyes: Pupils are equal, round, and reactive to light. Conjunctivae are normal.  Neck: Normal range of motion.  Cardiovascular: Regular rhythm and normal heart sounds.  Respiratory: Effort normal. No respiratory distress.  GI: Soft.  Musculoskeletal: Normal range of motion.  Neurological: She is alert.  Skin: Skin is warm and dry.  Psychiatric: Her mood appears anxious. Her affect is blunt. Her speech is delayed. She is withdrawn. She is not agitated and not aggressive. Thought content is paranoid. Cognition and memory are impaired. She expresses impulsivity. She expresses no homicidal and no suicidal ideation.    Review of Systems  Constitutional: Negative.   HENT: Negative.   Eyes: Negative.   Respiratory: Negative.   Cardiovascular: Negative.   Gastrointestinal: Negative.   Musculoskeletal: Negative.   Skin: Negative.   Neurological: Negative.   Psychiatric/Behavioral: Positive for hallucinations and memory loss. Negative for depression, substance abuse and suicidal ideas. The patient is nervous/anxious. The patient does not have insomnia.     Blood pressure (!) 153/87, pulse 77, temperature 97.6 F (36.4 C), temperature source Oral, resp. rate 18, height 5\' 10"  (1.778 m), weight 74.6 kg, SpO2 100 %.Body mass index is 23.6 kg/m.  General Appearance: Casual  Eye Contact:  Fair  Speech:  Slow  Volume:  Decreased  Mood:  Anxious and Dysphoric  Affect:  Constricted and Restricted  Thought Process:  Disorganized  Orientation:  Full (Time, Place, and Person)  Thought Content:  Hallucinations: Auditory and Paranoid Ideation  Suicidal Thoughts:  No  Homicidal Thoughts:  No  Memory:  Immediate;   Fair Recent;   Fair Remote;   Fair  Judgement:  Fair  Insight:   Fair  Psychomotor Activity:  Decreased  Concentration:  Concentration: Fair  Recall:  [employer] of Knowledge:  Fair  Language:  Fair  Akathisia:  No  Handed:  Right  AIMS (if indicated):     Assets:  Desire for Improvement Financial Resources/Insurance Housing Physical Health Resilience Social Support  ADL's:  Intact  Cognition:  WNL  Sleep:  Number of Hours: 8      COGNITIVE FEATURES THAT CONTRIBUTE TO RISK:  Thought constriction (tunnel vision)    SUICIDE RISK:   Minimal: No identifiable suicidal ideation.  Patients presenting with no risk factors but with morbid ruminations; may be classified as minimal risk based on the severity of the depressive symptoms  PLAN OF CARE: Continue 15-minute checks.  Continue current medication for depression and psychosis along with engagement in recreational and therapeutic activities.  Reassess in an ongoing way as far as suicidality and other dangerousness prior to discharge  I certify that inpatient services furnished can reasonably be expected to improve the patient's condition.   Mordecai Rasmussen, MD 08/16/2018, 3:28 PM

## 2018-08-16 NOTE — Progress Notes (Signed)
D: Patient stated slept good last night .Stated appetite is good and energy level  Is normal. Stated concentration is good . Stated on Depression scale 0, hopeless and anxiety 4  .( low 0-10 high) Denies suicidal  homicidal ideations  .  No auditory hallucinations  No pain concerns . Appropriate ADL'S. Interacting with peers and staff. Patient able to attend unit programing , working on coping, anxiety and decision making . Patient able to identify  Feelings.  Patient voice of her past  Now being brought up and having to deal with it through religion . Anxious  During this time  Limited  Eye  Contact   A: Encourage patient participation with unit programming . Instruction  Given on  Medication , verbalize understanding.  R: Voice no other concerns. Staff continue to monitor

## 2018-08-16 NOTE — B[facility] Suicide Risk Assessment (Signed)
[facility] INPATIENT:  Family/Significant Other Suicide Prevention Education  Suicide Prevention Education:  Education Completed; Zeah Vancleave, [phone] has been identified by the patient as the family member/significant other with whom the patient will be residing, and identified as the person(s) who will aid the patient in the event of a mental health crisis (suicidal ideations/suicide attempt).  With written consent from the patient, the family member/significant other has been provided the following suicide prevention education, prior to the and/or following the discharge of the patient.  The suicide prevention education provided includes the following:  Suicide risk factors  Suicide prevention and interventions  National Suicide Hotline telephone number  [facility] assessment telephone number  [facility] Emergency Assistance 911  [facility] and/or Residential Mobile Crisis Unit telephone number  Request made of family/significant other to:  Remove weapons (e.g., guns, rifles, knives), all items previously/currently identified as safety concern.    Remove drugs/medications (over-the-counter, prescriptions, illicit drugs), all items previously/currently identified as a safety concern.  The family member/significant other verbalizes understanding of the suicide prevention education information provided.  The family member/significant other agrees to remove the items of safety concern listed above.  Husband reports that the patient stopped taking her medications "and became paranoid".  Husband reports that "when she gets like that she starts remembering things from her past which can take a toll on her".    Harden Mo 08/16/2018, 11:18 AM

## 2018-08-16 NOTE — H&P (Signed)
Psychiatric Admission Assessment Adult  Patient Identification: Amy Casey MRN:  [id number] Date of Evaluation:  08/16/2018 Chief Complaint:  schizophrenia Principal Diagnosis: Schizophrenia (HCC) Diagnosis:  Principal Problem:   Schizophrenia (HCC) Active Problems:   Essential hypertension, benign   Psychogenic depressive psychosis (HCC)   Acute psychosis (HCC)   Catatonia  History of Present Illness: Patient seen and chart reviewed.  This is an intake note for this patient with a history of chronic mental health problems who was transferred to [place] from the medical service.  Patient had originally been admitted to [facility] in the first few days of May.  She presented there extremely withdrawn and essentially catatonic.  She had not been eating or drinking for several days and had stopped her medicine.  She was admitted to their medical service for IV fluid and stabilization.  They had initially requested that we transfer her to our hospital for psychiatric care but at that point she was in no condition for it.  We did eventually agree to transfer her here to medical in order to start ECT however the patient's family refused ECT after she got here.  Now after another week on our medical service she is finally ambulatory enough and eating well enough that she can be transferred to the psychiatric service here.  Patient was cooperative with the interview but still a little confused.  She said that she had perhaps not taken her medicine a little bit although she is unclear about it.  She vaguely remembers being at [facility].  She continues to say that she believes that someone is trying to hurt her and that she will not make it through the weekend.  She continues to admit to auditory hallucinations.  Denies suicidal or homicidal thought.  Eating better.  No complaints about medicine. Associated Signs/Symptoms: Depression Symptoms:  depressed mood, anhedonia, psychomotor  retardation, difficulty concentrating, recurrent thoughts of death, (Hypo) Manic Symptoms:  None Anxiety Symptoms:  Excessive Worry, Psychotic Symptoms:  Hallucinations: Auditory Paranoia, PTSD Symptoms: Negative Total Time spent with patient: 1 hour  Past Psychiatric History: Patient has a history of recurrent psychiatric illness.  Has probably had at least 5 or 6 hospitalizations.  Diagnoses look like they have gone between schizophrenia and depression.  Patient when she is at her baseline evidently functions pretty well at home.  Without her medicine however she has had multiple hospitalizations almost always in the same condition she was in when she came in for this 1.  No known history of suicide attempts.  Multiple medications including antipsychotics and antidepressants have been tried.  Is the patient at risk to self? Yes.    Has the patient been a risk to self in the past 6 months? Yes.    Has the patient been a risk to self within the distant past? Yes.    Is the patient a risk to others? No.  Has the patient been a risk to others in the past 6 months? No.  Has the patient been a risk to others within the distant past? No.   Prior Inpatient Therapy:   Prior Outpatient Therapy:    Alcohol Screening: 1. How often do you have a drink containing alcohol?: Never 2. How many drinks containing alcohol do you have on a typical day when you are drinking?: 1 or 2 3. How often do you have six or more drinks on one occasion?: Never AUDIT-C Score: 0 4. How often during the last year have you found  that you were not able to stop drinking once you had started?: Never 5. How often during the last year have you failed to do what was normally expected from you becasue of drinking?: Never 6. How often during the last year have you needed a first drink in the morning to get yourself going after a heavy drinking session?: Never 7. How often during the last year have you had a feeling of guilt of  remorse after drinking?: Never 8. How often during the last year have you been unable to remember what happened the night before because you had been drinking?: Never 9. Have you or someone else been injured as a result of your drinking?: No 10. Has a relative or friend or a doctor or another health worker been concerned about your drinking or suggested you cut down?: No Alcohol Use Disorder Identification Test Final Score (AUDIT): 0 Alcohol Brief Interventions/Follow-up: AUDIT Score <7 follow-up not indicated Substance Abuse History in the last 12 months:  No. Consequences of Substance Abuse: Negative Previous Psychotropic Medications: Yes  Psychological Evaluations: Yes  Past Medical History:  Past Medical History:  Diagnosis Date  . Depression    Dr. Mila Homer, [facility]; hospitalization 07/2010  . History of echocardiogram 2011   [facility]  . History of renal stone   . Hypertension   . Hypokalemia   . Obesity   . Schizophrenia (HCC)   . Wears glasses     Past Surgical History:  Procedure Laterality Date  . WISDOM TOOTH EXTRACTION     Family History:  Family History  Problem Relation Age of Onset  . Diabetes Mother   . Kidney disease Mother        dialysis  . Heart disease Mother   . Cancer Maternal Uncle        lung  . Stroke Neg Hx   . Hypertension Neg Hx   . Hyperlipidemia Neg Hx    Family Psychiatric  History: No history known Tobacco Screening:   Social History:  Social History   Substance and Sexual Activity  Alcohol Use No     Social History   Substance and Sexual Activity  Drug Use No    Additional Social History: Marital status: Married Number of Years Married: 11 What types of issues is patient dealing with in the relationship?: Pt reports "mostly financial". Are you sexually active?: Yes What is your sexual orientation?: Heterosexual Has your sexual activity been affected by drugs, alcohol, medication, or emotional stress?: Pt denies. Does patient  have children?: Yes How many children?: 3 How is patient's relationship with their children?: Pt reports "so so, it's okay, I don't know."                         Allergies:   Allergies  Allergen Reactions  . Paliperidone Other (See Comments)    Angioedema, drooling, slurred speech, ptosis  . Sulfa Antibiotics Other (See Comments)    unknown  . Lisinopril Rash    Angioedema    Lab Results:  Results for orders placed or performed during the hospital encounter of 08/15/18 (from the past 48 hour(s))  Creatinine, serum     Status: None   Collection Time: 08/16/18  7:06 AM  Result Value Ref Range   Creatinine, Ser 0.79 0.44 - 1.00 mg/dL   GFR calc non Af Amer >60 >60 mL/min   GFR calc Af Amer >60 >60 mL/min    Comment: Performed at [employer]  [facility], [address] [place], [place] [address]    Blood Alcohol level:  Lab Results  Component Value Date   [facility] <10 07/30/2018   ETH <5 11/08/2016    Metabolic Disorder Labs:  Lab Results  Component Value Date   HGBA1C 5.7 (H) 06/16/2015   MPG 117 (H) 06/16/2015   MPG 126 (H) 02/22/2014   No results found for: PROLACTIN Lab Results  Component Value Date   CHOL 128 07/19/2017   TRIG 68 07/19/2017   HDL 37 (L) 07/19/2017   CHOLHDL 3.5 07/19/2017   VLDL 14 07/19/2017   LDLCALC 77 07/19/2017   LDLCALC 97 07/25/2016    Current Medications: Current Facility-Administered Medications  Medication Dose Route Frequency Provider Last Rate Last Dose  . 0.9 %  sodium chloride infusion  500 mL Intravenous Once Clapacs, John T, MD      . acetaminophen (TYLENOL) tablet 650 mg  650 mg Oral Q6H PRN Mariel Craft, MD       Or  . acetaminophen (TYLENOL) suppository 650 mg  650 mg Rectal Q6H PRN Mariel Craft, MD      . alum & mag hydroxide-simeth (MAALOX/MYLANTA) 200-200-20 MG/5ML suspension 30 mL  30 mL Oral Q4H PRN Mariel Craft, MD      . amLODipine (NORVASC) tablet 5 mg  5 mg Oral Daily Mariel Craft, MD   5  mg at 08/16/18 6045  . diphenhydrAMINE (BENADRYL) capsule 50 mg  50 mg Oral QHS PRN Mariel Craft, MD      . enoxaparin (LOVENOX) injection 40 mg  40 mg Subcutaneous Q24H Mariel Craft, MD   40 mg at 08/15/18 2149  . feeding supplement (ENSURE ENLIVE) (ENSURE ENLIVE) liquid 237 mL  237 mL Oral BID BM Mariel Craft, MD   237 mL at 08/16/18 1016  . FLUoxetine (PROZAC) capsule 60 mg  60 mg Oral Daily Mariel Craft, MD   60 mg at 08/16/18 4098  . hydrOXYzine (ATARAX/VISTARIL) tablet 25 mg  25 mg Oral Q6H PRN Catalina Gravel, NP      . OLANZapine zydis (ZYPREXA) disintegrating tablet 5 mg  5 mg Oral Q8H PRN Mariel Craft, MD       And  . LORazepam (ATIVAN) tablet 1 mg  1 mg Oral PRN Mariel Craft, MD       And  . ziprasidone (GEODON) injection 20 mg  20 mg Intramuscular PRN Mariel Craft, MD      . magnesium hydroxide (MILK OF MAGNESIA) suspension 30 mL  30 mL Oral Daily PRN Mariel Craft, MD      . mirtazapine (REMERON SOL-TAB) disintegrating tablet 7.5 mg  7.5 mg Oral QHS Mariel Craft, MD   7.5 mg at 08/15/18 2141  . [START ON 08/17/2018] multivitamin with minerals tablet 1 tablet  1 tablet Oral Daily Clapacs, John T, MD      . OLANZapine zydis (ZYPREXA) disintegrating tablet 2.5 mg  2.5 mg Oral BID WC Mariel Craft, MD   2.5 mg at 08/16/18 1216  . OLANZapine zydis (ZYPREXA) disintegrating tablet 20 mg  20 mg Oral QHS Mariel Craft, MD   20 mg at 08/15/18 2140  . ondansetron (ZOFRAN) tablet 4 mg  4 mg Oral Q6H PRN Mariel Craft, MD       Or  . ondansetron [facility]) injection 4 mg  4 mg Intravenous Q6H PRN Mariel Craft, MD  PTA Medications: Medications Prior to Admission  Medication Sig Dispense Refill Last Dose  . amLODipine (NORVASC) 5 MG tablet Take 1 tablet (5 mg total) by mouth daily. 30 tablet 0 Past Month at Unknown time  . benztropine (COGENTIN) 1 MG tablet Take 1 mg by mouth 2 (two) times daily.   Past Month at Unknown time  . feeding  supplement, ENSURE ENLIVE, (ENSURE ENLIVE) LIQD Take 237 mLs by mouth 2 (two) times daily between meals. 237 mL 12   . FLUoxetine (PROZAC) 40 MG capsule Take 1 capsule (40 mg total) by mouth daily. 90 capsule 0 Past Month at Unknown time  . methocarbamol (ROBAXIN) 500 MG tablet Take 500 mg by mouth daily.   Past Month at Unknown time  . mirtazapine (REMERON SOL-TAB) 15 MG disintegrating tablet Take 0.5 tablets (7.5 mg total) by mouth at bedtime.     Marland Kitchen. OLANZapine zydis (ZYPREXA) 20 MG disintegrating tablet Take 1 tablet (20 mg total) by mouth at bedtime.     Marland Kitchen. OLANZapine zydis (ZYPREXA) 5 MG disintegrating tablet Take 0.5 tablets (2.5 mg total) by mouth 2 (two) times daily with a meal.       Musculoskeletal: Strength & Muscle Tone: within normal limits Gait & Station: normal Patient leans: N/A  Psychiatric Specialty Exam: Physical Exam  Nursing note and vitals reviewed. Constitutional: She appears well-developed and well-nourished.  HENT:  Head: Normocephalic and atraumatic.  Eyes: Pupils are equal, round, and reactive to light. Conjunctivae are normal.  Neck: Normal range of motion.  Cardiovascular: Regular rhythm and normal heart sounds.  Respiratory: Effort normal. No respiratory distress.  GI: Soft.  Musculoskeletal: Normal range of motion.  Neurological: She is alert.  Skin: Skin is warm and dry.  Psychiatric: Her affect is blunt. Her speech is delayed. She is slowed and withdrawn. Thought content is paranoid. Cognition and memory are impaired. She expresses impulsivity. She expresses no homicidal and no suicidal ideation.    Review of Systems  Constitutional: Negative.   HENT: Negative.   Eyes: Negative.   Respiratory: Negative.   Cardiovascular: Negative.   Gastrointestinal: Negative.   Musculoskeletal: Negative.   Skin: Negative.   Neurological: Negative.   Psychiatric/Behavioral: Positive for depression, hallucinations and memory loss. Negative for substance abuse and  suicidal ideas. The patient is nervous/anxious.     Blood pressure (!) 153/87, pulse 77, temperature 97.6 F (36.4 C), temperature source Oral, resp. rate 18, height 5\' 10"  (1.778 m), weight 74.6 kg, SpO2 100 %.Body mass index is 23.6 kg/m.  General Appearance: Casual  Eye Contact:  Fair  Speech:  Slow  Volume:  Decreased  Mood:  Anxious and Dysphoric  Affect:  Constricted and Restricted  Thought Process:  Disorganized  Orientation:  Full (Time, Place, and Person)  Thought Content:  Hallucinations: Auditory and Paranoid Ideation  Suicidal Thoughts:  No  Homicidal Thoughts:  No  Memory:  Immediate;   Fair Recent;   Poor Remote;   Poor  Judgement:  Fair  Insight:  Shallow  Psychomotor Activity:  Decreased  Concentration:  Concentration: Fair  Recall:  [employer]  Fund of Knowledge:  Fair  Language:  Fair  Akathisia:  No  Handed:  Right  AIMS (if indicated):     Assets:  Desire for Improvement Housing Physical Health Resilience  ADL's:  Intact  Cognition:  Impaired,  Mild  Sleep:  Number of Hours: 8    Treatment Plan Summary: Daily contact with patient to assess and evaluate symptoms and progress  in treatment, Medication management and Plan Patient is currently on a combination of fluoxetine and olanzapine.  Has tolerated it well.  Has been a combination typical of what has helped her in the past.  Plan is to continue current medicine.  Psychoeducation and review of plan completed with patient.  I suggest that she probably should be stabilized for another few days since she is still having hallucinations and paranoid worried thoughts.  She will then ultimately be discharged to follow-up at [facility].  Observation Level/Precautions:  15 minute checks  Laboratory:  HbAIC  Psychotherapy:    Medications:    Consultations:    Discharge Concerns:    Estimated LOS:  Other:     Physician Treatment Plan for Primary Diagnosis: Schizophrenia (HCC) Long Term Goal(s):  Improvement in symptoms so as ready for discharge  Short Term Goals: Ability to identify and develop effective coping behaviors will improve and Compliance with prescribed medications will improve  Physician Treatment Plan for Secondary Diagnosis: Principal Problem:   Schizophrenia (HCC) Active Problems:   Essential hypertension, benign   Psychogenic depressive psychosis (HCC)   Acute psychosis (HCC)   Catatonia  Long Term Goal(s): Improvement in symptoms so as ready for discharge  Short Term Goals: Ability to maintain clinical measurements within normal limits will improve  I certify that inpatient services furnished can reasonably be expected to improve the patient's condition.    Mordecai Rasmussen, MD 5/21/20203:32 PM

## 2018-08-16 NOTE — Progress Notes (Signed)
Pastoral Care Visit   08/16/18 1330  Clinical Encounter Type  Visited With Other (Comment) (pt was in group)  Visit Type Initial;Behavioral Health  Referral From Nurse  Consult/Referral To Chaplain  Spiritual Encounters  Spiritual Needs Sacred text   Pt requested Bible.  Chap delivered several Bibles and devotional booklets for patients in BM at the RN station. Staff will get Bible to pt.  Milinda Antis, [address]

## 2018-08-16 NOTE — Progress Notes (Signed)
Patient is adjusting well  in the unit, took her medicines as ordered , and educated on care plans and unit safety guide lines , patient is compliant with care regimen and eager to be redirected in the unit, safety is maintained , patient denies any SI/HI/AVH, endorse minimal depressions as she expressed the need for her medication to start working so I can be discharged home, support and encouragement is provided and patient responded with good affect and mood appropriate, no distress only requiring 15 minutes safety checks.

## 2018-08-16 NOTE — BHH Group Notes (Signed)
LCSW Group Therapy Note  08/16/2018 11:50 AM  Type of Therapy/Topic:  Group Therapy:  Balance in Life  Participation Level:  None  Description of Group:    This group will address the concept of balance and how it feels and looks when one is unbalanced. Patients will be encouraged to process areas in their lives that are out of balance and identify reasons for remaining unbalanced. Facilitators will guide patients in utilizing problem-solving interventions to address and correct the stressor making their life unbalanced. Understanding and applying boundaries will be explored and addressed for obtaining and maintaining a balanced life. Patients will be encouraged to explore ways to assertively make their unbalanced needs known to significant others in their lives, using other group members and facilitator for support and feedback.  Therapeutic Goals: 1. Patient will identify two or more emotions or situations they have that consume much of in their lives. 2. Patient will identify signs/triggers that life has become out of balance:  3. Patient will identify two ways to set boundaries in order to achieve balance in their lives:  4. Patient will demonstrate ability to communicate their needs through discussion and/or role plays  Summary of Patient Progress: Pt was present in group, but did not participate in the group. Pt reported to CSW at the end of the group that she enjoyed group and she got some good information out of it.     Therapeutic Modalities:   Cognitive Behavioral Therapy Solution-Focused Therapy Assertiveness Training  Iris Pert, MSW, LCSW Clinical Social Work 08/16/2018 11:50 AM

## 2018-08-16 NOTE — Plan of Care (Signed)
Patient able to attend unit programing , working on coping, anxiety and decision making . Patient able to identify  feelings . Problem: Education: Goal: Ability to state activities that reduce stress will improve Outcome: Progressing   Problem: Coping: Goal: Ability to identify and develop effective coping behavior will improve Outcome: Progressing   Problem: Self-Concept: Goal: Ability to identify factors that promote anxiety will improve Outcome: Progressing Goal: Level of anxiety will decrease Outcome: Progressing Goal: Ability to modify response to factors that promote anxiety will improve Outcome: Progressing   Problem: Coping: Goal: Coping ability will improve Outcome: Progressing Goal: Will verbalize feelings Outcome: Progressing   Problem: Health Behavior/Discharge Planning: Goal: Ability to make decisions will improve Outcome: Progressing Goal: Compliance with therapeutic regimen will improve Outcome: Progressing
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
## 2018-08-16 NOTE — Progress Notes (Signed)
Recreation Therapy Notes  INPATIENT RECREATION THERAPY ASSESSMENT  Patient Details Name: Ardath Lykken MRN: [id number] DOB: 1960/07/12 Today's Date: 08/16/2018       Information Obtained From: Patient  Able to Participate in Assessment/Interview: Yes  Patient Presentation: Responsive  Reason for Admission (Per Patient): Active Symptoms  Patient Stressors: Work  Pharmacologist:   Film/video editor, TV  Leisure Interests (2+):  Individual - TV, Music - Listen(Work)  Frequency of Recreation/Participation:    Awareness of Community Resources:     [employer]:     Current Use:    If no, Barriers?:    Expressed Interest in [employer] Information:    [place] of Residence:  [place]  Patient Main Form of Transportation: Car  Patient Strengths:  helping others  Patient Identified Areas of Improvement:  listening to others  Patient Goal for Hospitalization:  To be a better me  Current SI (including self-harm):  No  Current HI:  No  Current AVH: No  Staff Intervention Plan: Group Attendance, Collaborate with Interdisciplinary Treatment Team  Consent to Intern Participation: N/A  [place]  08/16/2018, 1:59 PM

## 2018-08-16 NOTE — BHH Group Notes (Signed)
B[facility] Group Notes:  (Nursing/MHT/Case Management/Adjunct)  Date:  08/16/2018  Time:  2:25 PM  Type of Therapy:   Participation Level:   Participation Quality:    Affect:  Appropriate and Excited  Cognitive:  Alert, Appropriate and Oriented  Insight:  Appropriate, Good and Improving  Engagement in Group:  Developing/Improving, Engaged, Improving and Supportive  Modes of Intervention:  Socialization and Support  Summary of Progress/Problems:  Amy Casey 08/16/2018, 2:25 PM

## 2018-08-17 NOTE — Progress Notes (Signed)
[facility] MD Progress Note  08/17/2018 4:00 PM Amy Casey  MRN:  [id number] Subjective: Patient seen chart reviewed.  Patient met with treatment team.  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  Patient has been taking her medicine regularly and has been eating regularly.  She is taking care of her basic hygiene and is up and about the unit.  On interview she still seems slightly confused but not completely so.  She is oriented to the situation.  Denies hallucinations.  Still seems a little paranoid and guarded but does not appear to be having hallucinations.  No evidence of suicidality. 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  Past Medical History:  Past Medical History:  Diagnosis Date  . Depression    Dr. Silvio Pate, [place]; hospitalization 07/2010  . History of echocardiogram 2011   [facility]  . History of renal stone   . Hypertension   . Hypokalemia   . Obesity   . Schizophrenia ([place])   . Wears glasses     Past Surgical History:  Procedure Laterality Date  . WISDOM TOOTH EXTRACTION     Family History:  Family History  Problem Relation Age of Onset  . Diabetes Mother   . Kidney disease Mother        dialysis  . Heart disease Mother   . Cancer Maternal Uncle        lung  . Stroke Neg Hx   . Hypertension Neg Hx   . Hyperlipidemia Neg Hx    Family Psychiatric  History: None known Social History:  Social History   Substance and Sexual Activity  Alcohol Use No     Social History   Substance and Sexual Activity  Drug Use No    Social History    Socioeconomic History  . Marital status: Married    Spouse name: Not on file  . Number of children: Not on file  . Years of education: Not on file  . Highest education level: Not on file  Occupational History  . Not on file  Social Needs  . Financial resource strain: Not on file  . Food insecurity:    Worry: Not on file    Inability: Not on file  . Transportation needs:    Medical: Not on file    Non-medical: Not on file  Tobacco Use  . Smoking status: Never Smoker  . Smokeless tobacco: Never Used  Substance and Sexual Activity  . Alcohol use: No  . Drug use: No  . Sexual activity: Not Currently  Lifestyle  . Physical activity:    Days per week: Not on file    Minutes per session: Not on file  . Stress: Not on file  Relationships  . Social connections:    Talks on phone: Not on file    Gets together: Not on file    Attends religious service: Not on file    Active member of club or organization: Not on file    Attends meetings of clubs or organizations: Not on file    Relationship status: Not on file  Other Topics Concern  . Not on file  Social History  Narrative   Married, 2 children, mother in law lives with them, was working as a Horticulturist, commercial Social History:                         Sleep: Fair  Appetite:  Fair  Current Medications: Current Facility-Administered Medications  Medication Dose Route Frequency Provider Last Rate Last Dose  . acetaminophen (TYLENOL) tablet 650 mg  650 mg Oral Q6H PRN Lavella Hammock, MD       Or  . acetaminophen (TYLENOL) suppository 650 mg  650 mg Rectal Q6H PRN Lavella Hammock, MD      . alum & mag hydroxide-simeth (MAALOX/MYLANTA) 200-200-20 MG/5ML suspension 30 mL  30 mL Oral Q4H PRN Lavella Hammock, MD      . amLODipine (NORVASC) tablet 5 mg  5 mg Oral Daily Lavella Hammock, MD   5 mg at 08/16/18 9528  . diphenhydrAMINE (BENADRYL) capsule 50 mg  50 mg Oral QHS PRN Lavella Hammock, MD      . feeding  supplement (ENSURE ENLIVE) (ENSURE ENLIVE) liquid 237 mL  237 mL Oral BID BM Lavella Hammock, MD   237 mL at 08/17/18 1100  . FLUoxetine (PROZAC) capsule 60 mg  60 mg Oral Daily Lavella Hammock, MD   60 mg at 08/17/18 1402  . hydrOXYzine (ATARAX/VISTARIL) tablet 25 mg  25 mg Oral Q6H PRN Lamont Dowdy, NP      . magnesium hydroxide (MILK OF MAGNESIA) suspension 30 mL  30 mL Oral Daily PRN Lavella Hammock, MD      . multivitamin with minerals tablet 1 tablet  1 tablet Oral Daily Clapacs, Madie Reno, MD   1 tablet at 08/17/18 1402  . OLANZapine zydis (ZYPREXA) disintegrating tablet 20 mg  20 mg Oral QHS Lavella Hammock, MD   20 mg at 08/16/18 2133    Lab Results:  Results for orders placed or performed during the hospital encounter of 08/15/18 (from the past 48 hour(s))  Creatinine, serum     Status: None   Collection Time: 08/16/18  7:06 AM  Result Value Ref Range   Creatinine, Ser 0.79 0.44 - 1.00 mg/dL   GFR calc non Af Amer >60 >60 mL/min   GFR calc Af Amer >60 >60 mL/min    Comment: Performed at [facility], [address] [place], [place] [address]    Blood Alcohol level:  Lab Results  Component Value Date   [facility] <10 07/30/2018   ETH <5 40/12/2723    Metabolic Disorder Labs: Lab Results  Component Value Date   HGBA1C 5.7 (H) 06/16/2015   MPG 117 (H) 06/16/2015   MPG 126 (H) 02/22/2014   No results found for: PROLACTIN Lab Results  Component Value Date   CHOL 128 07/19/2017   TRIG 68 07/19/2017   HDL 37 (L) 07/19/2017   CHOLHDL 3.5 07/19/2017   VLDL 14 07/19/2017   LDLCALC 77 07/19/2017   LDLCALC 97 07/25/2016    Physical Findings: AIMS:  , ,  ,  ,    CIWA:    COWS:     Musculoskeletal: Strength & Muscle Tone: within normal limits Gait & Station: normal Patient leans: N/A  Psychiatric Specialty Exam: Physical Exam  Nursing note and vitals reviewed. Constitutional: She appears well-developed and well-nourished.  HENT:  Head:  Normocephalic and atraumatic.  Eyes: Pupils are equal, round, and reactive to light. Conjunctivae are normal.  Neck:  Normal range of motion.  Cardiovascular: Regular rhythm and normal heart sounds.  Respiratory: Effort normal. No respiratory distress.  GI: Soft.  Musculoskeletal: Normal range of motion.  Neurological: She is alert.  Skin: Skin is warm and dry.  Psychiatric: Judgment normal. Her mood appears anxious. Her affect is blunt. Her speech is delayed. She is slowed. Thought content is paranoid. Cognition and memory are impaired. She expresses no homicidal and no suicidal ideation.    Review of Systems  Constitutional: Negative.   HENT: Negative.   Eyes: Negative.   Respiratory: Negative.   Cardiovascular: Negative.   Gastrointestinal: Negative.   Musculoskeletal: Negative.   Skin: Negative.   Neurological: Negative.   Psychiatric/Behavioral: Negative for depression, hallucinations, memory loss, substance abuse and suicidal ideas. The patient is not nervous/anxious and does not have insomnia.     Blood pressure (!) 69/57, pulse (!) 125, temperature 97.6 F (36.4 C), temperature source Oral, resp. rate 18, height _0  (1.778 m), weight 74.6 kg, SpO2 100 %.Body mass index is 23.6 kg/m.  General Appearance: Casual  Eye Contact:  Fair  Speech:  Slow  Volume:  Decreased  Mood:  Anxious  Affect:  Constricted  Thought Process:  Coherent  Orientation:  Full (Time, Place, and Person)  Thought Content:  Rumination and Tangential  Suicidal Thoughts:  No  Homicidal Thoughts:  No  Memory:  Immediate;   Fair Recent;   Fair Remote;   Fair  Judgement:  Fair  Insight:  Fair  Psychomotor Activity:  Decreased  Concentration:  Concentration: Fair  Recall:  [employer] of Knowledge:  Fair  Language:  Fair  Akathisia:  No  Handed:  Right  AIMS (if indicated):     Assets:  Desire for Improvement Housing Physical Health Resilience Social Support  ADL's:  Intact  Cognition:   Impaired,  Mild  Sleep:  Number of Hours: 8     Treatment Plan Summary: Daily contact with patient to assess and evaluate symptoms and progress in treatment, Medication management and Plan Patient continues to improve with symptoms of psychosis and depression and withdrawal much better.  She is able to carry on full conversations.  While she seems confused and still paranoid she is able to cooperate with treatment here.  She does not appear to be acting out in a dangerous manner.  Patient still seems a little too confused and poorly functioning to be discharged today.  I would like to give her a couple more days to make sure she is consistently eating.  She had a spell of blood pressure being low this afternoon.  Not sure what that is as most of her other blood pressures have been borderline high.  She will stay on her blood pressure medicine.  I have cleaned up some of her prescription orders to better reflect what she will likely be on when she is discharged.  Plan is to continue current medication and assessment and likely discharge by Monday if she continues to do well.  Alethia Berthold, MD 08/17/2018, 4:00 PM

## 2018-08-17 NOTE — Progress Notes (Signed)
Patient's BP was low, this writer encouraged patient to increase her fluid intake and she was also provided with a Gatorade. This Clinical research associate will notify MD.

## 2018-08-17 NOTE — Progress Notes (Signed)
This Clinical research associate obtained patient's BP, after drinking some Gatorade and eating dinner, and it is now WNL. MD notified of these findings.

## 2018-08-17 NOTE — Plan of Care (Signed)
D- Patient alert and oriented. Patient presents in a pleasant mood on assessment stating that she slept "ok" last night and had no major complaints to voice to this writer at this time. Patient denies any signs/symptoms of depression, however, she endorses anxiety stating that she is "just worried about my past coming back to haunt me". Patient also denies SI, HI, AVH, and pain at this time. Patient had no stated goals for today.  A- Scheduled medications administered to patient, per MD orders. Support and encouragement provided.  Routine safety checks conducted every 15 minutes.  Patient informed to notify staff with problems or concerns.  R- No adverse drug reactions noted. Patient contracts for safety at this time. Patient compliant with medications and treatment plan. Patient receptive, calm, and cooperative. Patient interacts well with others on the unit.  Patient remains safe at this time.  Problem: Education: Goal: Ability to state activities that reduce stress will improve Outcome: Progressing   Problem: Coping: Goal: Ability to identify and develop effective coping behavior will improve Outcome: Progressing   Problem: Self-Concept: Goal: Ability to identify factors that promote anxiety will improve Outcome: Progressing Goal: Level of anxiety will decrease Outcome: Progressing Goal: Ability to modify response to factors that promote anxiety will improve Outcome: Progressing   Problem: Coping: Goal: Coping ability will improve Outcome: Progressing Goal: Will verbalize feelings Outcome: Progressing   Problem: Health Behavior/Discharge Planning: Goal: Ability to make decisions will improve Outcome: Progressing Goal: Compliance with therapeutic regimen will improve Outcome: Progressing

## 2018-08-17 NOTE — Tx Team (Addendum)
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 0.9 %  sodium chloride infusion  500 mL Intravenous Once Clapacs, John T, MD      . acetaminophen (TYLENOL) tablet 650 mg  650 mg Oral Q6H PRN Lavella Hammock, MD       Or  . acetaminophen (TYLENOL) suppository 650 mg  650 mg Rectal Q6H PRN Lavella Hammock, MD      . alum & mag hydroxide-simeth (MAALOX/MYLANTA) 200-200-20 MG/5ML suspension 30 mL  30 mL Oral Q4H PRN Lavella Hammock, MD      . amLODipine (NORVASC) tablet 5 mg  5 mg Oral Daily Lavella Hammock, MD   5 mg at 08/16/18 5916  . diphenhydrAMINE (BENADRYL) capsule 50 mg  50 mg Oral QHS PRN Lavella Hammock, MD      . enoxaparin (LOVENOX) injection 40 mg  40 mg Subcutaneous Q24H Lavella Hammock, MD   Stopped at 08/16/18 2126  . feeding supplement (ENSURE ENLIVE) (ENSURE ENLIVE) liquid 237 mL  237 mL Oral BID BM Lavella Hammock, MD   237 mL at 08/17/18 1100  . FLUoxetine (PROZAC) capsule 60 mg  60 mg Oral Daily Lavella Hammock, MD   60 mg at 08/17/18 1402  . hydrOXYzine (ATARAX/VISTARIL) tablet 25 mg  25 mg Oral Q6H PRN Lamont Dowdy, NP      . OLANZapine zydis (ZYPREXA) disintegrating tablet 5 mg  5 mg Oral Q8H PRN Lavella Hammock, MD       And  . LORazepam (ATIVAN) tablet 1 mg  1 mg Oral PRN Lavella Hammock, MD       And  . ziprasidone (GEODON) injection 20 mg  20 mg Intramuscular PRN Lavella Hammock, MD      . magnesium hydroxide (MILK OF MAGNESIA) suspension 30 mL  30 mL Oral Daily PRN Lavella Hammock, MD      . mirtazapine  (REMERON SOL-TAB) disintegrating tablet 7.5 mg  7.5 mg Oral QHS Lavella Hammock, MD   7.5 mg at 08/16/18 2134  . multivitamin with minerals tablet 1 tablet  1 tablet Oral Daily Clapacs, Madie Reno, MD   1 tablet at 08/17/18 1402  . OLANZapine zydis (ZYPREXA) disintegrating tablet 2.5 mg  2.5 mg Oral BID WC Lavella Hammock, MD   2.5 mg at 08/17/18 1402  . OLANZapine zydis (ZYPREXA) disintegrating tablet 20 mg  20 mg Oral QHS Lavella Hammock, MD   20 mg at 08/16/18 2133  . ondansetron (ZOFRAN) tablet 4 mg  4 mg Oral Q6H PRN Lavella Hammock, MD       Or  . ondansetron [facility]) injection 4 mg  4 mg Intravenous Q6H PRN Lavella Hammock, MD       PTA Medications: Medications Prior to Admission  Medication Sig Dispense Refill Last Dose  . amLODipine (NORVASC) 5 MG tablet Take 1 tablet (5 mg total) by mouth daily. 30 tablet 0 Past Month at Unknown time  . benztropine (COGENTIN) 1 MG tablet Take 1 mg by mouth 2 (two) times daily.   Past Month at Unknown time  .  feeding supplement, ENSURE ENLIVE, (ENSURE ENLIVE) LIQD Take 237 mLs by mouth 2 (two) times daily between meals. 237 mL 12   . FLUoxetine (PROZAC) 40 MG capsule Take 1 capsule (40 mg total) by mouth daily. 90 capsule 0 Past Month at Unknown time  . methocarbamol (ROBAXIN) 500 MG tablet Take 500 mg by mouth daily.   Past Month at Unknown time  . mirtazapine (REMERON SOL-TAB) 15 MG disintegrating tablet Take 0.5 tablets (7.5 mg total) by mouth at bedtime.     Marland Kitchen OLANZapine zydis (ZYPREXA) 20 MG disintegrating tablet Take 1 tablet (20 mg total) by mouth at bedtime.     Marland Kitchen OLANZapine zydis (ZYPREXA) 5 MG disintegrating tablet Take 0.5 tablets (2.5 mg total) by mouth 2 (two) times daily with a meal.       Patient Stressors: Financial difficulties Other: covid 19  Patient Strengths: Motivation for treatment/growth Supportive family/friends  Treatment Modalities: Medication Management, Group therapy, Case management,  1 to 1 session with clinician,  Psychoeducation, Recreational therapy.   Physician Treatment Plan for Primary Diagnosis: Schizophrenia ([place]) Long Term Goal(s): Improvement in symptoms so as ready for discharge Improvement in symptoms so as ready for discharge   Short Term Goals: Ability to identify and develop effective coping behaviors will improve Compliance with prescribed medications will improve Ability to maintain clinical measurements within normal limits will improve  Medication Management: Evaluate patient's response, side effects, and tolerance of medication regimen.  Therapeutic Interventions: 1 to 1 sessions, Unit Group sessions and Medication administration.  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  Therapeutic Interventions: 1 to 1 sessions, Unit Group sessions and Medication administration.  Evaluation of Outcomes: Not Met   RN Treatment Plan for Primary Diagnosis: Schizophrenia ([place]) Long Term Goal(s): Knowledge of disease and therapeutic regimen to maintain health will improve  Short Term Goals: Ability to participate in decision making will improve, Ability to verbalize feelings will improve, Ability to identify and develop effective coping behaviors will improve and Compliance with prescribed medications will improve  Medication  Management: RN will administer medications as ordered by provider, will assess and evaluate patient's response and provide education to patient for prescribed medication. RN will report any adverse and/or side effects to prescribing provider.  Therapeutic Interventions: 1 on 1 counseling sessions, Psychoeducation, Medication administration, Evaluate responses to treatment, Monitor vital signs and CBGs as ordered, Perform/monitor CIWA, COWS, AIMS and Fall Risk screenings as ordered, Perform wound care treatments as ordered.  Evaluation of Outcomes: Not Met   LCSW Treatment Plan for Primary Diagnosis: Schizophrenia ([place]) Long Term Goal(s): Safe transition to appropriate next level of care at discharge, Engage patient in therapeutic group addressing interpersonal concerns.  Short Term Goals: Engage patient in aftercare planning with referrals and resources  Therapeutic Interventions: Assess for all discharge needs, 1 to 1 time with Social worker, Explore available resources and support systems, Assess for adequacy in community support network, Educate family  and significant other(s) on suicide prevention, Complete Psychosocial Assessment, Interpersonal group therapy.  Evaluation of Outcomes: Not Met   Progress in Treatment: Attending groups: Yes. Participating in groups: Yes. Taking medication as prescribed: Yes. Toleration medication: Yes. Family/Significant other contact made: Yes, individual(s) contacted:  Janell Quiet Patient understands diagnosis: No. Discussing patient identified problems/goals with staff: Yes. Medical problems stabilized or resolved: No. Denies suicidal/homicidal ideation: Yes. Issues/concerns per patient self-inventory: No. Other: N/A  New problem(s) identified: No, Describe:  none reported  New Short Term/Long Term Goal(s): Attend outpatient treatment, take medication as prescribed, develop and implement healthy coping methods to manage stressors  Patient  Goals:  "Get out and have somewhere to live"  Discharge Plan or Barriers: Pt will return home and follow up with outpatient treatment.  Reason for Continuation of Hospitalization: Medication stabilization  Estimated Length of Stay:3-5 days  Recreational Therapy: Patient Stressors: Work Patient Goal: Patient will identify 3 positive coping skills to decrease depressive symptoms within 5 recreation therapy group sessions  Attendees: Patient:Amy Casey 08/17/2018 3:14 PM  Physician: Alethia Berthold 08/17/2018 3:14 PM  Nursing: [place] Pugh 08/17/2018 3:14 PM  RN Care Manager: 08/17/2018 3:14 PM  Social Worker: Sanjuana Kava [place] 08/17/2018 3:14 PM  Recreational Therapist: Isaias Sakai Rai [place] 08/17/2018 3:14 PM  Other:  08/17/2018 3:14 PM  Other:  08/17/2018 3:14 PM  Other: 08/17/2018 3:14 PM    Scribe for Treatment Team: Yvette Rack, LCSW 08/17/2018 3:14 PM

## 2018-08-17 NOTE — Progress Notes (Signed)
Recreation Therapy Notes          Ciara Kagan 08/17/2018 11:36 AM

## 2018-08-17 NOTE — BHH Group Notes (Signed)
Feelings Around Relapse 08/17/2018 1PM  Type of Therapy and Topic:  Group Therapy:  Feelings around Relapse and Recovery  Participation Level:  Minimal   Description of Group:    Patients in this group will discuss emotions they experience before and after a relapse. They will process how experiencing these feelings, or avoidance of experiencing them, relates to having a relapse. Facilitator will guide patients to explore emotions they have related to recovery. Patients will be encouraged to process which emotions are more powerful. They will be guided to discuss the emotional reaction significant others in their lives may have to patients' relapse or recovery. Patients will be assisted in exploring ways to respond to the emotions of others without this contributing to a relapse.  Therapeutic Goals: 1. Patient will identify two or more emotions that lead to a relapse for them 2. Patient will identify two emotions that result when they relapse 3. Patient will identify two emotions related to recovery 4. Patient will demonstrate ability to communicate their needs through discussion and/or role plays   Summary of Patient Progress:  Minimal interaction with group members, sat quietly throughout session. No input provided.   Therapeutic Modalities:   Cognitive Behavioral Therapy Solution-Focused Therapy Assertiveness Training Relapse Prevention Therapy   Suzan Slick, LCSW 08/17/2018 1:49 PM

## 2018-08-17 NOTE — Progress Notes (Signed)
Patient has been sleeping majority of the morning and is now awake, so this writer is going to administer her morning medications after she eats lunch. Patient's BP was slightly elevated and this Clinical research associate notified MD of these findings.

## 2018-08-18 DIAGNOSIS — F200 Paranoid schizophrenia: Secondary | ICD-10-CM

## 2018-08-18 NOTE — Plan of Care (Signed)
Patient observed to be cooperative and pleasant with moderate amount of anxiety. Demonstrates amount of understanding of her mental health issues. Denies SI/HI/audio-visual hallucinations. Reflects understanding of how to begin to deal with coping, will encourage the behavior. Problem: Self-Concept: Goal: Ability to identify factors that promote anxiety will improve Outcome: Progressing Goal: Ability to modify response to factors that promote anxiety will improve Outcome: Progressing   Problem: Self-Concept: Goal: Level of anxiety will decrease Outcome: Not Progressing   Problem: Health Behavior/Discharge Planning: Goal: Ability to make decisions will improve Outcome: Not Progressing Goal: Compliance with therapeutic regimen will improve Outcome: Not Progressing

## 2018-08-18 NOTE — [facility] Group Notes (Signed)
LCSW Group Therapy Note   08/18/2018 1:15pm   Type of Therapy and Topic:  Group Therapy:  Trust and Honesty  Participation Level:  Did Not Attend  Description of Group:    In this group patients will be asked to explore the value of being honest.  Patients will be guided to discuss their thoughts, feelings, and behaviors related to honesty and trusting in others. Patients will process together how trust and honesty relate to forming relationships with peers, family members, and self. Each patient will be challenged to identify and express feelings of being vulnerable. Patients will discuss reasons why people are dishonest and identify alternative outcomes if one was truthful (to self or others). This group will be process-oriented, with patients participating in exploration of their own experiences, giving and receiving support, and processing challenge from other group members.   Therapeutic Goals: 1. Patient will identify why honesty is important to relationships and how honesty overall affects relationships.  2. Patient will identify a situation where they lied or were lied too and the  feelings, thought process, and behaviors surrounding the situation 3. Patient will identify the meaning of being vulnerable, how that feels, and how that correlates to being honest with self and others. 4. Patient will identify situations where they could have told the truth, but instead lied and explain reasons of dishonesty.   Summary of Patient Progress: Pt was invited to attend group but chose not to attend. CSW will continue to encourage pt to attend group throughout their admission.     Therapeutic Modalities:   Cognitive Behavioral Therapy Solution Focused Therapy Motivational Interviewing Brief Therapy  Nuria Phebus  CUEBAS-COLON, LCSW 08/18/2018 12:39 PM

## 2018-08-18 NOTE — Progress Notes (Signed)
D- Patient alert and oriented. Pleasant affect mood. Denies SI, HI, AVH, and pain. Demonstrates anxiety and restlessness  But cooperative and pleasant. . A- Scheduled medications administered to patient, per MD orders. Support and encouragement provided.  Routine safety checks conducted every 15 minutes.  Patient informed to notify staff with problems or concerns. R- No adverse drug reactions noted. Patient contracts for safety at this time. Patient compliant with medications and treatment plan. Patient receptive, calm, and cooperative. Patient interacts well with others on the unit.  Patient remains safe at this time.

## 2018-08-18 NOTE — Progress Notes (Signed)
[facility]  08/18/2018 10:12 AM Harvel QualeBeverly Aiken  MRN:  [id number] Subjective: Patient was seen discussed with the treatment team.  Per staff patient has been somewhat anxious about her medications.  Is worried but has been cooperative on the unit.  She has been compliant with taking her medications and has a good appetite.  She reports sleeping well.  She is groomed neatly and is up and about the unit.  She is less catatonic.  Speech is still a bit slow. 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  .  She is oriented to the situation.  Denies hallucinations.  Still seems a little paranoid and guarded but does not appear to be having hallucinations.  No evidence of suicidality.  Principal Problem: Schizophrenia (HCC) Diagnosis: Principal Problem:   Schizophrenia (HCC) Active Problems:   Essential hypertension, benign   Psychogenic depressive psychosis (HCC)   Acute psychosis (HCC)   Catatonia  Total Time spent with patient: 30 minutes  Past Psychiatric History: Patient has a history of recurrent spells of what sounds like psychotic depression at times with catatonia.  Past Medical History:  Past Medical History:  Diagnosis Date  . Depression    Dr. Mila HomerSena, [facility]; hospitalization 07/2010  . History of echocardiogram 2011   [facility]  . History of renal stone   . Hypertension   . Hypokalemia   . Obesity   . Schizophrenia (HCC)   . Wears glasses     Past Surgical History:  Procedure Laterality Date  . WISDOM TOOTH EXTRACTION     Family History:  Family History  Problem Relation Age of Onset  . Diabetes Mother   . Kidney disease Mother        dialysis  . Heart disease Mother   . Cancer Maternal Uncle        lung  . Stroke Neg Hx   . Hypertension Neg Hx   . Hyperlipidemia Neg Hx    Family Psychiatric  History: None known Social History:  Social History   Substance and Sexual Activity  Alcohol Use No      Social History   Substance and Sexual Activity  Drug Use No    Social History   Socioeconomic History  . Marital status: Married    Spouse name: Not on file  . Number of children: Not on file  . Years of education: Not on file  . Highest education level: Not on file  Occupational History  . Not on file  Social Needs  . Financial resource strain: Not on file  . Food insecurity:    Worry: Not on file    Inability: Not on file  . Transportation needs:    Medical: Not on file    Non-medical: Not on file  Tobacco Use  . Smoking status: Never Smoker  . Smokeless tobacco: Never Used  Substance and Sexual Activity  . Alcohol use: No  . Drug use: No  . Sexual activity: Not Currently  Lifestyle  . Physical activity:    Days per week: Not on file    Minutes per session: Not on file  . Stress: Not on file  Relationships  . Social connections:    Talks on phone: Not on file    Gets together: Not on file    Attends religious service: Not on file    Active member of club or organization: Not on file    Attends meetings of clubs or organizations:  Not on file    Relationship status: Not on file  Other Topics Concern  . Not on file  Social History Narrative   Married, 2 children, mother in law lives with them, was working as a Print production planner Social History:                         Sleep: Fair  Appetite:  Fair  Current Medications: Current Facility-Administered Medications  Medication Dose Route Frequency Provider Last Rate Last Dose  . acetaminophen (TYLENOL) tablet 650 mg  650 mg Oral Q6H PRN Mariel Craft, MD       Or  . acetaminophen (TYLENOL) suppository 650 mg  650 mg Rectal Q6H PRN Mariel Craft, MD      . alum & mag hydroxide-simeth (MAALOX/MYLANTA) 200-200-20 MG/5ML suspension 30 mL  30 mL Oral Q4H PRN Mariel Craft, MD      . amLODipine (NORVASC) tablet 5 mg  5 mg Oral Daily Mariel Craft, MD   5 mg at 08/18/18 1093  .  diphenhydrAMINE (BENADRYL) capsule 50 mg  50 mg Oral QHS PRN Mariel Craft, MD      . feeding supplement (ENSURE ENLIVE) (ENSURE ENLIVE) liquid 237 mL  237 mL Oral BID BM Mariel Craft, MD   237 mL at 08/17/18 1500  . FLUoxetine (PROZAC) capsule 60 mg  60 mg Oral Daily Mariel Craft, MD   60 mg at 08/18/18 2355  . hydrOXYzine (ATARAX/VISTARIL) tablet 25 mg  25 mg Oral Q6H PRN Catalina Gravel, NP      . magnesium hydroxide (MILK OF MAGNESIA) suspension 30 mL  30 mL Oral Daily PRN Mariel Craft, MD      . multivitamin with minerals tablet 1 tablet  1 tablet Oral Daily Clapacs, Jackquline Denmark, MD   1 tablet at 08/18/18 [phone]  . OLANZapine zydis (ZYPREXA) disintegrating tablet 20 mg  20 mg Oral QHS Mariel Craft, MD   20 mg at 08/17/18 2147    Lab Results:  No results found for this or any previous visit (from the past 48 hour(s)).  Blood Alcohol level:  Lab Results  Component Value Date   ETH <10 07/30/2018   ETH <5 11/08/2016    Metabolic Disorder Labs: Lab Results  Component Value Date   HGBA1C 5.7 (H) 06/16/2015   MPG 117 (H) 06/16/2015   MPG 126 (H) 02/22/2014   No results found for: PROLACTIN Lab Results  Component Value Date   CHOL 128 07/19/2017   TRIG 68 07/19/2017   HDL 37 (L) 07/19/2017   CHOLHDL 3.5 07/19/2017   VLDL 14 07/19/2017   LDLCALC 77 07/19/2017   LDLCALC 97 07/25/2016    Physical Findings: AIMS:  , ,  ,  ,    CIWA:    COWS:     Musculoskeletal: Strength & Muscle Tone: within normal limits Gait & Station: normal Patient leans: N/A  Psychiatric Specialty Exam: Physical Exam  Nursing note and vitals reviewed. Constitutional: She appears well-developed and well-nourished.  HENT:  Head: Normocephalic and atraumatic.  Eyes: Pupils are equal, round, and reactive to light. Conjunctivae are normal.  Neck: Normal range of motion.  Cardiovascular: Regular rhythm and normal heart sounds.  Respiratory: Effort normal. No respiratory distress.   GI: Soft.  Musculoskeletal: Normal range of motion.  Neurological: She is alert.  Skin: Skin is warm and dry.  Psychiatric: Judgment normal. Her mood appears anxious.  Her affect is blunt. Her speech is delayed. She is slowed. Thought content is paranoid. Cognition and memory are impaired. She expresses no homicidal and no suicidal ideation.    Review of Systems  Constitutional: Negative.   HENT: Negative.   Eyes: Negative.   Respiratory: Negative.   Cardiovascular: Negative.   Gastrointestinal: Negative.   Musculoskeletal: Negative.   Skin: Negative.   Neurological: Negative.   Psychiatric/Behavioral: Negative for depression, hallucinations, memory loss, substance abuse and suicidal ideas. The patient is not nervous/anxious and does not have insomnia.     Blood pressure (!) 148/88, pulse (!) 153, temperature 98.3 F (36.8 C), temperature source Oral, resp. rate 18, height  (1.778 m), weight 74.6 kg, SpO2 100 %.Body mass index is 23.6 kg/m.  General Appearance: Casual  Eye Contact:  Fair  Speech:  Slow  Volume:  Decreased  Mood:  Anxious  Affect:  Constricted  Thought Process:  Coherent  Orientation:  Full (Time, Place, and Person)  Thought Content:  Rumination and Tangential  Suicidal Thoughts:  No  Homicidal Thoughts:  No  Memory:  Immediate;   Fair Recent;   Fair Remote;   Fair  Judgement:  Fair  Insight:  Fair  Psychomotor Activity:  Decreased  Concentration:  Concentration: Fair  Recall:  [employer] of Knowledge:  Fair  Language:  Fair  Akathisia:  No  Handed:  Right  AIMS (if indicated):     Assets:  Desire for Improvement Housing Physical Health Resilience Social Support  ADL's:  Intact  Cognition:  Impaired,  Mild  Sleep:  Number of Hours: 7.15     Treatment Plan Summary:  Patient is a 58 year old African-American female with schizoaffective disorder.  She came in with catatonia and confusion.  She is making slow progress.  She has been eating  okay but continues to be very anxious and worried.  Slow to respond and it is difficult to assess if she is able to process in a normal manner. Continue to monitor her over the weekend and discharge on Monday if she stabilizes further. Patient's blood pressure is elevated today at 148/88 mmHg. Will continue to monitor.  Patrick North, MD 08/18/2018, 10:12 AM

## 2018-08-18 NOTE — Plan of Care (Signed)
Patient denies SI/HI/AVH. Patient is anxious and worried. Patient's safety is maintained on unit. Patient is adherent with medication.    Problem: Education: Goal: Ability to state activities that reduce stress will improve Outcome: Not Progressing   Problem: Coping: Goal: Ability to identify and develop effective coping behavior will improve Outcome: Not Progressing

## 2018-08-19 DIAGNOSIS — F200 Paranoid schizophrenia: Secondary | ICD-10-CM

## 2018-08-19 MED ORDER — CLONIDINE HCL 0.1 MG PO TABS
0.1000 mg | ORAL_TABLET | Freq: Once | ORAL | Status: DC
  Filled 2018-08-19: qty 1

## 2018-08-19 MED ORDER — AMLODIPINE BESYLATE 5 MG PO TABS
10.0000 mg | ORAL_TABLET | Freq: Every day | ORAL | Status: DC
  Administered 2018-08-20: 10 mg via ORAL
  Filled 2018-08-19: qty 2

## 2018-08-19 NOTE — Progress Notes (Signed)
Patient refused her afternoon Ensure, stating that she wants to put the supplement in the refrigerator and get it later.

## 2018-08-19 NOTE — Progress Notes (Signed)
Patient was ordered a one time dose of Clonidine, however, her BP was 99/76 when checked before administration, so this Clinical research associate held medication and informed MD of these findings.

## 2018-08-19 NOTE — Plan of Care (Signed)
Active in the milieu, cooperative and compliant with treatment.  

## 2018-08-19 NOTE — Progress Notes (Signed)
Patient stayed in the milieu, cooperative and compliant with treatment. Denying thoughts of self harm. Received bedtime medications, had a snack and went to bed. Currently sleeping and safety maintained.

## 2018-08-19 NOTE — [facility] Group Notes (Signed)
LCSW Group Therapy Note 08/19/2018 1:15pm  Type of Therapy and Topic: Group Therapy: Feelings Around Returning Home & Establishing a Supportive Framework and Supporting Oneself When Supports Not Available  Participation Level: Active  Description of Group:  Patients first processed thoughts and feelings about upcoming discharge. These included fears of upcoming changes, lack of change, new living environments, judgements and expectations from others and overall stigma of mental health issues. The group then discussed the definition of a supportive framework, what that looks and feels like, and how do to discern it from an unhealthy non-supportive network. The group identified different types of supports as well as what to do when your family/friends are less than helpful or unavailable  Therapeutic Goals  1. Patient will identify one healthy supportive network that they can use at discharge. 2. Patient will identify one factor of a supportive framework and how to tell it from an unhealthy network. 3. Patient able to identify one coping skill to use when they do not have positive supports from others. 4. Patient will demonstrate ability to communicate their needs through discussion and/or role plays.  Summary of Patient Progress:  The patient reported she feels "okay." Pt engaged during group session. As patients processed their anxiety about discharge and described healthy supports patient shared she is ready to be discharge.  Patients identified at least one self-care tool they were willing to use after discharge.   Therapeutic Modalities Cognitive Behavioral Therapy Motivational Interviewing   Makel Mcmann  CUEBAS-COLON, LCSW 08/19/2018 12:04 PM

## 2018-08-19 NOTE — Progress Notes (Signed)
[facility] MD Progress Note  08/19/2018 10:41 AM Amy Casey  MRN:  [id number] Subjective: Patient was seen discussed with the treatment team.  Per staff patient continues to be  anxious about her medications.  Is worried but has been cooperative on the unit.  She has been compliant with taking her medications and has a good appetite.  She reports sleeping well.  She is groomed neatly and is up and about the unit.  She is less catatonic.  Speech is still a bit slow. 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  .  She is oriented to the situation.  Denies hallucinations. Denies suicidal thoughts.  Principal Problem: Schizophrenia (HCC) Diagnosis: Principal Problem:   Schizophrenia (HCC) Active Problems:   Essential hypertension, benign   Psychogenic depressive psychosis (HCC)   Acute psychosis (HCC)   Catatonia  Total Time spent with patient: 30 minutes  Past Psychiatric History: Patient has a history of recurrent spells of what sounds like psychotic depression at times with catatonia.  Past Medical History:  Past Medical History:  Diagnosis Date  . Depression    Dr. Mila Homer, [facility]; hospitalization 07/2010  . History of echocardiogram 2011   [facility]  . History of renal stone   . Hypertension   . Hypokalemia   . Obesity   . Schizophrenia (HCC)   . Wears glasses     Past Surgical History:  Procedure Laterality Date  . WISDOM TOOTH EXTRACTION     Family History:  Family History  Problem Relation Age of Onset  . Diabetes Mother   . Kidney disease Mother        dialysis  . Heart disease Mother   . Cancer Maternal Uncle        lung  . Stroke Neg Hx   . Hypertension Neg Hx   . Hyperlipidemia Neg Hx    Family Psychiatric  History: None known Social History:  Social History   Substance and Sexual Activity  Alcohol Use No     Social History   Substance and Sexual Activity  Drug Use No    Social History    Socioeconomic History  . Marital status: Married    Spouse name: Not on file  . Number of children: Not on file  . Years of education: Not on file  . Highest education level: Not on file  Occupational History  . Not on file  Social Needs  . Financial resource strain: Not on file  . Food insecurity:    Worry: Not on file    Inability: Not on file  . Transportation needs:    Medical: Not on file    Non-medical: Not on file  Tobacco Use  . Smoking status: Never Smoker  . Smokeless tobacco: Never Used  Substance and Sexual Activity  . Alcohol use: No  . Drug use: No  . Sexual activity: Not Currently  Lifestyle  . Physical activity:    Days per week: Not on file    Minutes per session: Not on file  . Stress: Not on file  Relationships  . Social connections:    Talks on phone: Not on file    Gets together: Not on file    Attends religious service: Not on file    Active member of club or organization: Not on file    Attends meetings of clubs or organizations: Not on file    Relationship status: Not on file  Other Topics Concern  .  Not on file  Social History Narrative   Married, 2 children, mother in law lives with them, was working as a Print production plannerwaitress   Additional Social History:                         Sleep: Fair  Appetite:  Fair  Current Medications: Current Facility-Administered Medications  Medication Dose Route Frequency Provider Last Rate Last Dose  . acetaminophen (TYLENOL) tablet 650 mg  650 mg Oral Q6H PRN Mariel CraftMaurer, Sheila M, MD       Or  . acetaminophen (TYLENOL) suppository 650 mg  650 mg Rectal Q6H PRN Mariel CraftMaurer, Sheila M, MD      . alum & mag hydroxide-simeth (MAALOX/MYLANTA) 200-200-20 MG/5ML suspension 30 mL  30 mL Oral Q4H PRN Mariel CraftMaurer, Sheila M, MD      . amLODipine (NORVASC) tablet 5 mg  5 mg Oral Daily Mariel CraftMaurer, Sheila M, MD   5 mg at 08/19/18 0847  . diphenhydrAMINE (BENADRYL) capsule 50 mg  50 mg Oral QHS PRN Mariel CraftMaurer, Sheila M, MD      . feeding  supplement (ENSURE ENLIVE) (ENSURE ENLIVE) liquid 237 mL  237 mL Oral BID BM Clapacs, John T, MD   237 mL at 08/18/18 1400  . FLUoxetine (PROZAC) capsule 60 mg  60 mg Oral Daily Mariel CraftMaurer, Sheila M, MD   60 mg at 08/19/18 [id number]  . hydrOXYzine (ATARAX/VISTARIL) tablet 25 mg  25 mg Oral Q6H PRN Catalina Gravelhomspon, Jacqueline, NP      . magnesium hydroxide (MILK OF MAGNESIA) suspension 30 mL  30 mL Oral Daily PRN Mariel CraftMaurer, Sheila M, MD      . multivitamin with minerals tablet 1 tablet  1 tablet Oral Daily Clapacs, Jackquline DenmarkJohn T, MD   1 tablet at 08/19/18 0847  . OLANZapine zydis (ZYPREXA) disintegrating tablet 20 mg  20 mg Oral QHS Mariel CraftMaurer, Sheila M, MD   20 mg at 08/18/18 2136    Lab Results:  No results found for this or any previous visit (from the past 48 hour(s)).  Blood Alcohol level:  Lab Results  Component Value Date   ETH <10 07/30/2018   ETH <5 11/08/2016    Metabolic Disorder Labs: Lab Results  Component Value Date   HGBA1C 5.7 (H) 06/16/2015   MPG 117 (H) 06/16/2015   MPG 126 (H) 02/22/2014   No results found for: PROLACTIN Lab Results  Component Value Date   CHOL 128 07/19/2017   TRIG 68 07/19/2017   HDL 37 (L) 07/19/2017   CHOLHDL 3.5 07/19/2017   VLDL 14 07/19/2017   LDLCALC 77 07/19/2017   LDLCALC 97 07/25/2016    Physical Findings: AIMS:  , ,  ,  ,    CIWA:    COWS:     Musculoskeletal: Strength & Muscle Tone: within normal limits Gait & Station: normal Patient leans: N/A  Psychiatric Specialty Exam: Physical Exam  Nursing note and vitals reviewed. Constitutional: She appears well-developed and well-nourished.  HENT:  Head: Normocephalic and atraumatic.  Eyes: Pupils are equal, round, and reactive to light. Conjunctivae are normal.  Neck: Normal range of motion.  Cardiovascular: Regular rhythm and normal heart sounds.  Respiratory: Effort normal. No respiratory distress.  GI: Soft.  Musculoskeletal: Normal range of motion.  Neurological: She is alert.  Skin: Skin is  warm and dry.  Psychiatric: Judgment normal. Her mood appears anxious. Her affect is blunt. Her speech is delayed. She is slowed. Thought content is paranoid. Cognition and  memory are impaired. She expresses no homicidal and no suicidal ideation.    Review of Systems  Constitutional: Negative.   HENT: Negative.   Eyes: Negative.   Respiratory: Negative.   Cardiovascular: Negative.   Gastrointestinal: Negative.   Musculoskeletal: Negative.   Skin: Negative.   Neurological: Negative.   Psychiatric/Behavioral: Negative for depression, hallucinations, memory loss, substance abuse and suicidal ideas. The patient is not nervous/anxious and does not have insomnia.     Blood pressure (!) 168/96, pulse 65, temperature 97.9 F (36.6 C), temperature source Oral, resp. rate 18, height  (1.778 m), weight 74.6 kg, SpO2 99 %.Body mass index is 23.6 kg/m.  General Appearance: Casual  Eye Contact:  Fair  Speech:  Slow  Volume:  Decreased  Mood:  Anxious  Affect:  Constricted  Thought Process:  Coherent  Orientation:  Full (Time, Place, and Person)  Thought Content:  Improving  With some rumination  Suicidal Thoughts:  No  Homicidal Thoughts:  No  Memory:  Immediate;   Fair Recent;   Fair Remote;   Fair  Judgement:  Fair  Insight:  Fair  Psychomotor Activity:  Decreased  Concentration:  Concentration: Fair  Recall:  [employer] of Knowledge:  Fair  Language:  Fair  Akathisia:  No  Handed:  Right  AIMS (if indicated):     Assets:  Desire for Improvement Housing Physical Health Resilience Social Support  ADL's:  Intact  Cognition:  Impaired,  Mild  Sleep:  Number of Hours: 5.45     Treatment Plan Summary:  Patient is a 58 year old African-American female with schizoaffective disorder.  She came in with catatonia and confusion.  She is making slow progress. She has been eating okay but continues to be very anxious and worried. Continue to monitor her over the weekend and  discharge on Monday if she stabilizes further. Patient's blood pressure is elevated today at 168/96 mmHg.  Will give a one time dose of Clonidine at 0.1mg . Increase norvasc to  daily.  Patrick North, MD 08/19/2018, 10:41 AM

## 2018-08-19 NOTE — Plan of Care (Signed)
D- Patient alert and oriented. Patient presents in a pleasant mood on assessment stating that she slept well last night, however, she is still tired.  Patient denies any depression, but states to this writer that she is "worried a little bit about getting out of here and doing what I need to get done". Patient also denies SI, HI, AVH, and pain at this time. Patient's goal for today is to "be a better parent and getting out of here".  A- Scheduled medications administered to patient, per MD orders. Support and encouragement provided.  Routine safety checks conducted every 15 minutes.  Patient informed to notify staff with problems or concerns.  R- No adverse drug reactions noted. Patient contracts for safety at this time. Patient compliant with medications and treatment plan. Patient receptive, calm, and cooperative. Patient interacts well with others on the unit.  Patient remains safe at this time.   Problem: Education: Goal: Ability to state activities that reduce stress will improve Outcome: Progressing   Problem: Coping: Goal: Ability to identify and develop effective coping behavior will improve Outcome: Progressing   Problem: Self-Concept: Goal: Ability to identify factors that promote anxiety will improve Outcome: Progressing Goal: Level of anxiety will decrease Outcome: Progressing Goal: Ability to modify response to factors that promote anxiety will improve Outcome: Progressing   Problem: Coping: Goal: Coping ability will improve Outcome: Progressing Goal: Will verbalize feelings Outcome: Progressing   Problem: Health Behavior/Discharge Planning: Goal: Ability to make decisions will improve Outcome: Progressing Goal: Compliance with therapeutic regimen will improve Outcome: Progressing

## 2018-08-20 MED ORDER — OLANZAPINE 20 MG PO TABS: 20.0000 mg | ORAL_TABLET | Freq: Every day | ORAL | 1 refills | Status: DC

## 2018-08-20 MED ORDER — FLUOXETINE HCL 20 MG PO CAPS: 60.0000 mg | ORAL_CAPSULE | Freq: Every day | ORAL | 1 refills | Status: DC

## 2018-08-20 MED ORDER — AMLODIPINE BESYLATE 10 MG PO TABS: 10.0000 mg | ORAL_TABLET | Freq: Every day | ORAL | 1 refills | Status: DC

## 2018-08-20 MED ORDER — OLANZAPINE 10 MG PO TABS: 20.0000 mg | ORAL_TABLET | Freq: Every day | ORAL | Status: DC

## 2018-08-20 MED ORDER — DOCUSATE SODIUM 100 MG PO CAPS: 200.0000 mg | ORAL_CAPSULE | Freq: Every day | ORAL | 1 refills | Status: DC

## 2018-08-20 MED ORDER — DOCUSATE SODIUM 100 MG PO CAPS: 200.0000 mg | ORAL_CAPSULE | Freq: Every day | ORAL | Status: DC

## 2018-08-20 NOTE — Progress Notes (Signed)
Recreation Therapy Notes  Date: 08/20/2018  Time: 9:30 am  Location: Craft room  Behavioral response: Appropriate   Intervention Topic: Coping Skills  Discussion/Intervention:  Group content on today was focused on coping skills. The group defined what coping skills are and when they can be used. Individuals described how they normally cope with thing and the coping skills they normally use. Patients expressed why it is important to cope with things and how not coping with things can affect you. The group participated in the intervention "My coping box" and made coping boxes while adding coping skills they could use in the future to the box.  Clinical Observations/Feedback:  Patient came to group and identified her financial situation as a problem she has been trying to cope with for a while. Participant explained that a negative coping skill she uses some times is spending money she does not have.She identified listening to music as a coping skill she enjoys. Individual was social with staff while participating in the intervention. Zaul Hubers LRT/CTRS         Eoin Willden 08/20/2018 11:45 AM

## 2018-08-20 NOTE — Plan of Care (Signed)
Patient is stable and alert responding well to treatment regimen, patient had a bath this evening and took her medications without showing any side effects, patient denies any SI/HI but appear slightly confused but recovering appropriately , coping is adequate, education is provided and encouraged patient to participate in group scheduled activities noted and understood, sleep is continuous with out any interruptions only requiring 15 minutes safety checks no distress.   Problem: Education: Goal: Ability to state activities that reduce stress will improve Outcome: Progressing   Problem: Coping: Goal: Ability to identify and develop effective coping behavior will improve Outcome: Progressing   Problem: Self-Concept: Goal: Ability to identify factors that promote anxiety will improve Outcome: Progressing Goal: Level of anxiety will decrease Outcome: Progressing Goal: Ability to modify response to factors that promote anxiety will improve Outcome: Progressing   Problem: Coping: Goal: Coping ability will improve Outcome: Progressing Goal: Will verbalize feelings Outcome: Progressing   Problem: Health Behavior/Discharge Planning: Goal: Ability to make decisions will improve Outcome: Progressing Goal: Compliance with therapeutic regimen will improve Outcome: Progressing

## 2018-08-20 NOTE — Discharge Summary (Signed)
Physician Discharge Summary Note  Patient:  Amy Casey is an 58 y.o., female MRN:  [id number] DOB:  11-May-1960 Patient phone:  [phone] (home)  Patient address:   [address] [place] [address],  Total Time spent with patient: 45 minutes  Date of Admission:  08/15/2018 Date of Discharge: Aug 20, 2018  Reason for Admission: Patient was transferred to [facility] regional hospital from [facility] because of a consideration for ECT treatment.  When she arrived at [facility] regional medical unit the husband and other family members change their mind about ECT.  Principal Problem: Schizophrenia [facility]) Discharge Diagnoses: Principal Problem:   Schizophrenia (HCC) Active Problems:   Essential hypertension, benign   Psychogenic depressive psychosis (HCC)   Acute psychosis (HCC)   Catatonia   Past Psychiatric History: Patient has a past history of recurrent episodes of hospitalization with similar episodes of anorexia social withdrawal probable catatonia.  I am still not certain whether the more correct diagnosis is schizoaffective disorder or recurrent psychotic depression but under the current circumstances the general affect is similar  Past Medical History:  Past Medical History:  Diagnosis Date  . Depression    Dr. Mila Homer, [facility]; hospitalization 07/2010  . History of echocardiogram 2011   [facility]  . History of renal stone   . Hypertension   . Hypokalemia   . Obesity   . Schizophrenia (HCC)   . Wears glasses     Past Surgical History:  Procedure Laterality Date  . WISDOM TOOTH EXTRACTION     Family History:  Family History  Problem Relation Age of Onset  . Diabetes Mother   . Kidney disease Mother        dialysis  . Heart disease Mother   . Cancer Maternal Uncle        lung  . Stroke Neg Hx   . Hypertension Neg Hx   . Hyperlipidemia Neg Hx    Family Psychiatric  History: None known Social History:  Social History   Substance and Sexual  Activity  Alcohol Use No     Social History   Substance and Sexual Activity  Drug Use No    Social History   Socioeconomic History  . Marital status: Married    Spouse name: Not on file  . Number of children: Not on file  . Years of education: Not on file  . Highest education level: Not on file  Occupational History  . Not on file  Social Needs  . Financial resource strain: Not on file  . Food insecurity:    Worry: Not on file    Inability: Not on file  . Transportation needs:    Medical: Not on file    Non-medical: Not on file  Tobacco Use  . Smoking status: Never Smoker  . Smokeless tobacco: Never Used  Substance and Sexual Activity  . Alcohol use: No  . Drug use: No  . Sexual activity: Not Currently  Lifestyle  . Physical activity:    Days per week: Not on file    Minutes per session: Not on file  . Stress: Not on file  Relationships  . Social connections:    Talks on phone: Not on file    Gets together: Not on file    Attends religious service: Not on file    Active member of club or organization: Not on file    Attends meetings of clubs or organizations: Not on file    Relationship status: Not  on file  Other Topics Concern  . Not on file  Social History Narrative   Married, 2 children, mother in law lives with them, was working as a Soil scientist Course: Patient was admitted to the psychiatric ward of the hospital after having stabilized for several days on medicine.  During her time on medicine she gradually started eating more and was able to get up out of bed and ambulate independently.  Here on the psychiatric ward she has been compliant with medication taking olanzapine as per primary treatment as well as fluoxetine.  She has attended groups and interacted appropriately with staff and peers.  She is taking care of her hygiene well.  She has not expressed any suicidal or homicidal thought.  Patient is interested in going home.  She admits today that  she still has some vague feelings that something bad might happen to her because of something that happened in the past but she denies any intention to do anything about it denies any suicidal or homicidal or aggressive thought.  She shows insight and understanding that this is a symptom to be treated.  She has been psycho educated about the diagnosis and the need for medicine.  She has outpatient appointments in place.  I have spoken to her husband and educated him as well about the importance of continuing outpatient mental health treatment.  Physical Findings: AIMS:  , ,  ,  ,    CIWA:    COWS:     Musculoskeletal: Strength & Muscle Tone: within normal limits Gait & Station: normal Patient leans: N/A  Psychiatric Specialty Exam: Physical Exam  Nursing note and vitals reviewed. Constitutional: She appears well-developed and well-nourished.  HENT:  Head: Normocephalic and atraumatic.  Eyes: Pupils are equal, round, and reactive to light. Conjunctivae are normal.  Neck: Normal range of motion.  Cardiovascular: Regular rhythm and normal heart sounds.  Respiratory: Effort normal.  GI: Soft.  Musculoskeletal: Normal range of motion.  Neurological: She is alert.  Skin: Skin is warm and dry.  Psychiatric: Judgment normal. Her affect is blunt. Her speech is delayed. She is slowed. Thought content is paranoid. Thought content is not delusional. Cognition and memory are normal. She expresses no homicidal and no suicidal ideation.    Review of Systems  Constitutional: Negative.   HENT: Negative.   Eyes: Negative.   Respiratory: Negative.   Cardiovascular: Negative.   Gastrointestinal: Negative.   Musculoskeletal: Negative.   Skin: Negative.   Neurological: Negative.   Psychiatric/Behavioral: Negative for depression, hallucinations, memory loss, substance abuse and suicidal ideas. The patient is nervous/anxious. The patient does not have insomnia.     Blood pressure 137/69, pulse 89,  temperature 98.1 F (36.7 C), temperature source Oral, resp. rate 17, height  (1.778 m), weight 74.6 kg, SpO2 100 %.Body mass index is 23.6 kg/m.  General Appearance: Casual  Eye Contact:  Fair  Speech:  Pressured  Volume:  Decreased  Mood:  Anxious  Affect:  Appropriate  Thought Process:  Goal Directed  Orientation:  Full (Time, Place, and Person)  Thought Content:  Logical and Paranoid Ideation  Suicidal Thoughts:  No  Homicidal Thoughts:  No  Memory:  Immediate;   Fair Recent;   Fair Remote;   Fair  Judgement:  Fair  Insight:  Fair  Psychomotor Activity:  Restlessness  Concentration:  Concentration: Fair  Recall:  [employer] of Knowledge:  Fair  Language:  Fair  Akathisia:  No  Handed:  Right  AIMS (if indicated):     Assets:  Communication Skills Desire for Improvement Financial Resources/Insurance Housing Physical Health Resilience Social Support  ADL's:  Intact  Cognition:  WNL  Sleep:  Number of Hours: 6.5        Has this patient used any form of tobacco in the last 30 days? (Cigarettes, Smokeless Tobacco, Cigars, and/or Pipes) Yes, No  Blood Alcohol level:  Lab Results  Component Value Date   ETH <10 07/30/2018   ETH <5 11/08/2016    Metabolic Disorder Labs:  Lab Results  Component Value Date   HGBA1C 5.7 (H) 06/16/2015   MPG 117 (H) 06/16/2015   MPG 126 (H) 02/22/2014   No results found for: PROLACTIN Lab Results  Component Value Date   CHOL 128 07/19/2017   TRIG 68 07/19/2017   HDL 37 (L) 07/19/2017   CHOLHDL 3.5 07/19/2017   VLDL 14 07/19/2017   LDLCALC 77 07/19/2017   LDLCALC 97 07/25/2016    See Psychiatric Specialty Exam and Suicide Risk Assessment completed by Attending Physician prior to discharge.  Discharge destination:  Home  Is patient on multiple antipsychotic therapies at discharge:  No   Has Patient had three or more failed trials of antipsychotic monotherapy by history:  No  Recommended Plan for Multiple  Antipsychotic Therapies: NA  Discharge Instructions    Diet - low sodium heart healthy   Complete by:  As directed    Increase activity slowly   Complete by:  As directed      Allergies as of 08/20/2018      Reactions   Paliperidone Other (See Comments)   Angioedema, drooling, slurred speech, ptosis   Sulfa Antibiotics Other (See Comments)   unknown   Lisinopril Rash   Angioedema      Medication List    STOP taking these medications   benztropine 1 MG tablet Commonly known as:  COGENTIN   feeding supplement (ENSURE ENLIVE) Liqd   methocarbamol 500 MG tablet Commonly known as:  ROBAXIN   mirtazapine 15 MG disintegrating tablet Commonly known as:  REMERON SOL-TAB   OLANZapine zydis 20 MG disintegrating tablet Commonly known as:  ZYPREXA   OLANZapine zydis 5 MG disintegrating tablet Commonly known as:  ZYPREXA Replaced by:  OLANZapine 20 MG tablet     TAKE these medications     Indication  amLODipine 10 MG tablet Commonly known as:  NORVASC Take 1 tablet (10 mg total) by mouth daily. Start taking on:  Aug 21, 2018 What changed:    medication strength  how much to take  Indication:  High Blood Pressure Disorder   docusate sodium 100 MG capsule Commonly known as:  COLACE Take 2 capsules (200 mg total) by mouth daily.  Indication:  Constipation   FLUoxetine 20 MG capsule Commonly known as:  PROZAC Take 3 capsules (60 mg total) by mouth daily. Start taking on:  Aug 21, 2018 What changed:    medication strength  how much to take  Indication:  Depression   OLANZapine 20 MG tablet Commonly known as:  ZYPREXA Take 1 tablet (20 mg total) by mouth at bedtime. Replaces:  OLANZapine zydis 5 MG disintegrating tablet  Indication:  Psychotic Depressive Illness      Follow-up Information    [facility] Follow up on 08/21/2018.   Why:  Please follow up with [facility] on Tuesday, May 26th at 2:00pm. Please take your hospital discharge paperwork with you. Thank  you. Contact information: [address] [place] [address] [phone]           Follow-up recommendations:  Activity:  Activity as tolerated Diet:  Regular diet Other:  [facility] or with psychiatric or medical provider of your choice.  Comments: Patient and husband are both aware that she still has some symptoms but understand that at her current rate we would hope that the symptoms would continue to improve.  I have advised them that if symptoms worsen particularly if she starts to become obviously confused or stops eating or stops functioning normally they should return to the hospital.  Signed: Mordecai RasmussenJohn Clapacs, MD 08/20/2018, 11:23 AM

## 2018-08-20 NOTE — BHH Suicide Risk Assessment (Signed)
[facility] Discharge Suicide Risk Assessment   Principal Problem: Schizophrenia [facility]) Discharge Diagnoses: Principal Problem:   Schizophrenia (HCC) Active Problems:   Essential hypertension, benign   Psychogenic depressive psychosis (HCC)   Acute psychosis (HCC)   Catatonia   Total Time spent with patient: 45 minutes  Musculoskeletal: Strength & Muscle Tone: within normal limits Gait & Station: normal Patient leans: N/A  Psychiatric Specialty Exam: Review of Systems  Constitutional: Negative.   HENT: Negative.   Eyes: Negative.   Respiratory: Negative.   Cardiovascular: Negative.   Gastrointestinal: Negative.   Musculoskeletal: Negative.   Skin: Negative.   Neurological: Negative.   Psychiatric/Behavioral: Negative for depression, hallucinations, memory loss, substance abuse and suicidal ideas. The patient is nervous/anxious. The patient does not have insomnia.     Blood pressure 137/69, pulse 89, temperature 98.1 F (36.7 C), temperature source Oral, resp. rate 17, height 5\' 10"  (1.778 m), weight 74.6 kg, SpO2 100 %.Body mass index is 23.6 kg/m.  General Appearance: Casual  Eye Contact::  Good  Speech:  Clear and Coherent409  Volume:  Decreased  Mood:  Anxious  Affect:  Constricted  Thought Process:  Goal Directed  Orientation:  Full (Time, Place, and Person)  Thought Content:  Logical and Paranoid Ideation  Suicidal Thoughts:  No  Homicidal Thoughts:  No  Memory:  Immediate;   Fair Recent;   Fair Remote;   Fair  Judgement:  Fair  Insight:  Fair  Psychomotor Activity:  Normal  Concentration:  Fair  Recall:  [employer] of Knowledge:Fair  Language: Fair  Akathisia:  No  Handed:  Right  AIMS (if indicated):     Assets:  Desire for Improvement Housing Physical Health Social Support  Sleep:  Number of Hours: 6.5  Cognition: WNL  ADL's:  Intact   Mental Status Per Nursing Assessment::   On Admission:  NA  Demographic Factors:  NA  Loss  Factors: NA  Historical Factors: NA  Risk Reduction Factors:   Sense of responsibility to family, Religious beliefs about death, Employed, Living with another person, especially a relative, Positive social support and Positive therapeutic relationship  Continued Clinical Symptoms:  Depression:   Anhedonia  Cognitive Features That Contribute To Risk:  None    Suicide Risk:  Minimal: No identifiable suicidal ideation.  Patients presenting with no risk factors but with morbid ruminations; may be classified as minimal risk based on the severity of the depressive symptoms  Follow-up Information    [facility] Follow up on 08/21/2018.   Why:  Please follow up with [facility] on Tuesday, May 26th at 2:00pm. Please take your hospital discharge paperwork with you. Thank you. Contact information: [address] [place] [address] [phone]           Plan Of Care/Follow-up recommendations:  Activity:  Activity as tolerated.  Recommend that she stay out of work for at least another week Diet:  Regular diet Other:  Spoke with the patient's husband and the patient.  Emphasized that she needs to stay on the prescription medicine that she is being provided and follow-up with [facility].  If her insurance is adequate she is certainly welcome to try to find other mental health providers in the area.  Do not stop medication.  Mordecai Rasmussen, MD 08/20/2018, 11:15 AM

## 2018-08-20 NOTE — Progress Notes (Signed)
Patient ID: Amy Casey, female   DOB: May 06, 1960, 58 y.o.   MRN: [id number]   Discharge Note:  Patient denies SI/HI/AVH at this time. Discharge instructions, AVS, prescriptions, and transition record gone over with patient. Patient agrees to comply with medication management, follow-up visit, and outpatient therapy. Patient belongings returned to patient. Patient questions and concerns addressed and answered. Patient ambulatory off unit. Patient discharged to home with Husband.

## 2018-08-20 NOTE — Progress Notes (Signed)
Recreation Therapy Notes  INPATIENT RECREATION TR PLAN  Patient Details Name: Amy Casey MRN: [id number] DOB: 07-22-1960 Today's Date: 08/20/2018  Rec Therapy Plan Is patient appropriate for Therapeutic Recreation?: Yes Treatment times per week: At least 3 Estimated Length of Stay: 5-7 days TR Treatment/Interventions: Group participation (Comment)  Discharge Criteria Pt will be discharged from therapy if:: Discharged Treatment plan/goals/alternatives discussed and agreed upon by:: Patient/family  Discharge Summary Short term goals set: Patient will identify 3 positive coping skills to decrease depressive symptoms within 5 recreation therapy group sessions Short term goals met: Adequate for discharge Progress toward goals comments: Groups attended Which groups?: Coping skills Reason goals not met: N/A Therapeutic equipment acquired: N/A Reason patient discharged from therapy: Discharge from hospital Pt/family agrees with progress & goals achieved: Yes Date patient discharged from therapy: 08/20/18   Archie Atilano 08/20/2018, 2:02 PM

## 2018-08-20 NOTE — Progress Notes (Signed)
  [facility] Adult Case Management Discharge Plan :  Will you be returning to the same living situation after discharge:  Yes,  pt returning to her home.   At discharge, do you have transportation home?: Yes,  pts husband will be providing transportation.  Do you have the ability to pay for your medications: Yes,  BCBS  Release of information consent forms completed and in the chart;  Patient's signature needed at discharge.  Patient to Follow up at: Follow-up Information    [facility] Follow up on 08/21/2018.   Why:  Please follow up with [facility] on Tuesday, May 26th at 2:00pm. Please take your hospital discharge paperwork with you. Thank you. Contact information: [address] [place] [address] [phone]           Next level of care provider has access to [facility] Link:no  Safety Planning and Suicide Prevention discussed: Yes,  SPE completed with her husband.      Has patient been referred to the Quitline?: N/A patient is not a smoker  Patient has been referred for addiction treatment: Pt. refused referral  Harden Mo, LCSW 08/20/2018, 11:20 AM

## 2018-08-21 ENCOUNTER — Ambulatory Visit: Payer: BLUE CROSS/BLUE SHIELD | Admitting: Medical

## 2018-08-21 ENCOUNTER — Other Ambulatory Visit: Payer: Self-pay

## 2018-08-21 ENCOUNTER — Encounter: Payer: Self-pay | Admitting: Medical

## 2018-08-21 VITALS — BP 120/80 | HR 80 | Temp 99.5°F | Resp 16 | Ht 70.0 in | Wt 170.4 lb

## 2018-08-21 DIAGNOSIS — Z1239 Encounter for other screening for malignant neoplasm of breast: Secondary | ICD-10-CM

## 2018-08-21 DIAGNOSIS — F29 Unspecified psychosis not due to a substance or known physiological condition: Secondary | ICD-10-CM

## 2018-08-21 DIAGNOSIS — Z0000 Encounter for general adult medical examination without abnormal findings: Secondary | ICD-10-CM | POA: Insufficient documentation

## 2018-08-21 DIAGNOSIS — R300 Dysuria: Secondary | ICD-10-CM | POA: Insufficient documentation

## 2018-08-21 DIAGNOSIS — Z129 Encounter for screening for malignant neoplasm, site unspecified: Secondary | ICD-10-CM

## 2018-08-21 DIAGNOSIS — I10 Essential (primary) hypertension: Secondary | ICD-10-CM

## 2018-08-21 DIAGNOSIS — R350 Frequency of micturition: Secondary | ICD-10-CM

## 2018-08-21 DIAGNOSIS — F209 Schizophrenia, unspecified: Secondary | ICD-10-CM | POA: Diagnosis not present

## 2018-08-21 DIAGNOSIS — R011 Cardiac murmur, unspecified: Secondary | ICD-10-CM

## 2018-08-21 LAB — POCT URINALYSIS DIP (PROADVANTAGE DEVICE)
Bilirubin, UA: NEGATIVE
Blood, UA: NEGATIVE
Glucose, UA: NEGATIVE mg/dL
Ketones, POC UA: NEGATIVE mg/dL
Leukocytes, UA: NEGATIVE
Nitrite, UA: NEGATIVE
Specific Gravity, Urine: 1.015
Urobilinogen, Ur: NEGATIVE
pH, UA: 6 (ref 5.0–8.0)

## 2018-08-21 NOTE — Progress Notes (Signed)
Sent message to [place] to check to see which way I need to go to begin referral.

## 2018-08-21 NOTE — Progress Notes (Signed)
Subjective: Chief Complaint  Patient presents with  . med check    med check    Medical team: Keniya Schlotterbeck, Kermit Baloavid S, PA-C here for primary care [facility]  Accompanied by husband today.  Last visit here 2018.   Here for general f/u.  We have tried numerous times to get her in for follow.   She finally came in with husband today.   She was just recently admitted to mental healthy hospital, just discharged yesterday.  Was at [facility], then transferred to [facility], discharged [facility] yesterday.  Had been seeing [facility] somewhat regularly but wasn't good about taking her medication.  Her daughter check ups on her some, husband tries to keep her on track with medications.  She notes one reason for prior compliance was sedation with night time medication, Zyprexa in the past. She and husband both handle their financial affairs. She baths herself, and she cooks, cleans the house.  Husband helps with this as well.    Husband mother lives with them.   She has 3 adult children, [place] in [place], other 2 in [place].    Husbands notes psychiatrist advise she not go back to work just yet, no sooner than June 1, but she and husband seem to disagree on this.  She was working [employer] prior, [employer].    She reports several months of urinary frequency and urgency.   Sometimes has accidents if can't get to bathroom fast enough.    No blood in urine.   No lower abdominal pain.   She does not know exactly how many times a day she urinates.     She was hospitalized early May through May 25, hospitalized through several hospitals for mental health eval.  Principal diagnosis was schizophrenia and associated diagnoses of HTN, psychosis, psychogenic depressive psychosis, catanoia  During his hospitalization several medicines were stopped including the following benztropine, robaxin, mirtazapine, olanzapine 5 and 20mg    No other new complaint  Past Medical History:   Diagnosis Date  . Depression    Dr. Mila HomerSena, [facility]; hospitalization 07/2010  . History of echocardiogram 2011   [facility]  . History of renal stone   . Hypertension   . Hypokalemia   . Obesity   . Schizophrenia (HCC)   . Wears glasses    Current Outpatient Medications on File Prior to Visit  Medication Sig Dispense Refill  . amLODipine (NORVASC) 10 MG tablet Take 1 tablet (10 mg total) by mouth daily. 30 tablet 1  . FLUoxetine (PROZAC) 20 MG capsule Take 3 capsules (60 mg total) by mouth daily. 90 capsule 1  . OLANZapine (ZYPREXA) 20 MG tablet Take 1 tablet (20 mg total) by mouth at bedtime. 30 tablet 1  . docusate sodium (COLACE) 100 MG capsule Take 2 capsules (200 mg total) by mouth daily. (Patient not taking: Reported on 08/21/2018) 60 capsule 1   No current facility-administered medications on file prior to visit.    ROS as in subjective   Objective: BP 120/80   Pulse 80   Temp 99.5 F (37.5 C) (Temporal)   Resp 16   Ht 5\' 10"  (1.778 m)   Wt 170 lb 6.4 oz (77.3 kg)   SpO2 98%   BMI 24.45 kg/m   Wt Readings from Last 3 Encounters:  08/21/18 170 lb 6.4 oz (77.3 kg)  08/15/18 164 lb 7.4 oz (74.6 kg)  08/14/18 164 lb 7.4 oz (74.6 kg)    BP Readings from Last  3 Encounters:  08/21/18 120/80  08/20/18 137/69  08/15/18 116/70    General appearance: alert, no distress, WD/WN,  Oral cavity: MMM, no lesions Neck: supple, no lymphadenopathy, no thyromegaly, no masses Heart: 2/6 brief systolic murmur heard best in left upper sternal border, otherwise RRR, normal S1, S2 Lungs: CTA bilaterally, no wheezes, rhonchi, or rales Abdomen: +bs, soft, non tender, non distended, no masses, no hepatomegaly, no splenomegaly Pulses: 2+ symmetric, upper and lower extremities, normal cap refill Psych: Pleasant, appropriate answers to questions, mostly normal affect, judgment has been some, certainly not flat or catatonic Neuro: CN II through XII intact, alert and oriented  x3    Assessment: Encounter Diagnoses  Name Primary?  . Dysuria Yes  . Essential hypertension, benign   . Screening for breast cancer   . Schizophrenia, unspecified type (HCC)   . Psychosis, unspecified psychosis type (HCC)   . Urinary frequency   . Screening for cancer   . Heart murmur     Plan: I reviewed her hospital discharge summary, medications reconciled.  Reviewed recent hospitalization H&P, discharge summary, labs, CT head, EKG.    She has been seeing [facility] psychiatry but wants to establish with a different psychiatry office.  I gave her a list of other mental health providers.  We discussed the following recommendations and concerns   Recommendations  We are going to refer you for social work referral to have a Child psychotherapist check up on you and make sure you are taking care of yourself, taking your medications, seeing your doctors as scheduled  Continue your current medications including amlodipine 10 mg daily for blood pressure, and your current medications for mood Zyprexa 20 mg daily and fluoxetine 20 mg daily   Please call to schedule your mammogram.   [facility]  [phone] [address] [place], [place] [address]   You are due for colon cancer screening.  Consider Cologuard versus colonoscopy test.  Check your insurance coverage to let me know which one you want to do in which 1 is covered by insurance   Your symptoms could suggest overactive bladder.  I recommend for the next 1 to 2 weeks keeping track of how many times you urinate in a day so we can have a number to go on.  Remember that too much sugary drinks or caffeine can make you urinate quite a bit.  I am reluctant to put you on medicine for overactive bladder due to potential side effects that may include irritability.  Begin Kegel exercises as we discussed.   Return in the near future for a full physical, fasting labs including cholesterol check, Pap  smear     Jemmie was seen today for med check.  Diagnoses and all orders for this visit:  Dysuria -     POCT Urinalysis DIP (Proadvantage Device)  Essential hypertension, benign  Screening for breast cancer -     MM DIGITAL SCREENING BILATERAL; Future  Schizophrenia, unspecified type (HCC)  Psychosis, unspecified psychosis type (HCC)  Urinary frequency  Screening for cancer  Heart murmur

## 2018-08-21 NOTE — Progress Notes (Signed)
Referral sent to [facility].

## 2018-08-21 NOTE — Progress Notes (Signed)
Mailed copy

## 2018-08-21 NOTE — Patient Instructions (Addendum)
Recommendations  We are going to refer you for social work referral to have a Child psychotherapist check up on you and make sure you are taking care of yourself, taking your medications, seeing your doctors as scheduled  Continue your current medications including amlodipine 10 mg daily for blood pressure, and your current medications for mood Zyprexa 20 mg daily and fluoxetine 20 mg daily   Please call to schedule your mammogram.   [facility]  [phone] [address] [place], [place] [address]   You are due for colon cancer screening.  Consider Cologuard versus colonoscopy test.  Check your insurance coverage to let me know which one you want to do in which 1 is covered by insurance   Your symptoms could suggest overactive bladder.  I recommend for the next 1 to 2 weeks keeping track of how many times you urinate in a day so we can have a number to go on.  Remember that too much sugary drinks or caffeine can make you urinate quite a bit.  I am reluctant to put you on medicine for overactive bladder due to potential side effects that may include irritability.  Begin Kegel exercises as we discussed.   I hear a heart murmur on your exam today.   When I see you back for physical, lets discuss doing an ultrasound of your heart to further evaluate this.  Return in the near future for a full physical, fasting labs including cholesterol check, Pap smear     Kegel Exercises Kegel exercises help strengthen the muscles that support the rectum, vagina, small intestine, bladder, and uterus. Doing Kegel exercises can help:  Improve bladder and bowel control.  Improve sexual response.  Reduce problems and discomfort during pregnancy. Kegel exercises involve squeezing your pelvic floor muscles, which are the same muscles you squeeze when you try to stop the flow of urine. The exercises can be done while sitting, standing, or lying down, but it is best to  vary your position. Exercises 1. Squeeze your pelvic floor muscles tight. You should feel a tight lift in your rectal area. If you are a female, you should also feel a tightness in your vaginal area. Keep your stomach, buttocks, and legs relaxed. 2. Hold the muscles tight for up to 10 seconds. 3. Relax your muscles. Repeat this exercise 50 times a day or as many times as told by your health care provider. Continue to do this exercise for at least 4-6 weeks or for as long as told by your health care provider. This information is not intended to replace advice given to you by your health care provider. Make sure you discuss any questions you have with your health care provider. Document Released: 02/29/2012 Document Revised: 07/25/2016 Document Reviewed: 02/01/2015 Elsevier Interactive Patient Education  2019 [employer].     RESOURCES in [place], [place]  If you are experiencing a mental health crisis or an emergency, please call 911 or go to the nearest emergency department.  [facility]   [phone] [facility]  [phone] [facility]   [phone]  Suicide Hotline 1-800-Suicide 984 522 4986)  National Suicide Prevention Lifeline 301-485-2523  667-400-7827)  Domestic Violence, Rape/Crisis - [facility] [place] 021-117-3567  [employer] Violence Hotline 1-800-799-SAFE (815) 148-3029)  To report Child or Elder Abuse, please call: [facility] Police Department  [phone] [facility] Department  [phone]  [facility] Crisis Line (480)754-5096  Teen Crisis line 587-319-4717 or 5870869452  Psychiatry and Counseling services  Dr. Len Blalockavid Fuller [address], [place], [place] [address] [phone]) [phone]   Dr. Milagros Evenerupinder Kaur, psychiatry [address], [place], [place] [address] [phone]) [phone]   [facility] [address], [place], [place] [address] [phone])  [phone]

## 2018-08-28 DIAGNOSIS — F4310 Post-traumatic stress disorder, unspecified: Secondary | ICD-10-CM | POA: Diagnosis not present

## 2018-08-28 DIAGNOSIS — F250 Schizoaffective disorder, bipolar type: Secondary | ICD-10-CM | POA: Diagnosis not present

## 2018-10-01 DIAGNOSIS — F250 Schizoaffective disorder, bipolar type: Secondary | ICD-10-CM | POA: Diagnosis not present

## 2018-10-01 DIAGNOSIS — F4310 Post-traumatic stress disorder, unspecified: Secondary | ICD-10-CM | POA: Diagnosis not present

## 2018-11-06 ENCOUNTER — Inpatient Hospital Stay (HOSPITAL_COMMUNITY)
Admission: EM | Admit: 2018-11-06 | Discharge: 2018-11-14 | DRG: 885 | Disposition: A | Discharge status: Other Acute Inpatient Hospital | Payer: BC Managed Care – PPO | Attending: Internal Medicine | Admitting: Internal Medicine

## 2018-11-06 ENCOUNTER — Other Ambulatory Visit: Payer: Self-pay

## 2018-11-06 ENCOUNTER — Encounter (HOSPITAL_COMMUNITY): Payer: Self-pay | Admitting: Emergency Medicine

## 2018-11-06 DIAGNOSIS — I10 Essential (primary) hypertension: Secondary | ICD-10-CM | POA: Diagnosis present

## 2018-11-06 DIAGNOSIS — F322 Major depressive disorder, single episode, severe without psychotic features: Secondary | ICD-10-CM | POA: Diagnosis present

## 2018-11-06 DIAGNOSIS — Z833 Family history of diabetes mellitus: Secondary | ICD-10-CM

## 2018-11-06 DIAGNOSIS — Z8249 Family history of ischemic heart disease and other diseases of the circulatory system: Secondary | ICD-10-CM | POA: Diagnosis not present

## 2018-11-06 DIAGNOSIS — E860 Dehydration: Secondary | ICD-10-CM | POA: Diagnosis present

## 2018-11-06 DIAGNOSIS — Z751 Person awaiting admission to adequate facility elsewhere: Secondary | ICD-10-CM | POA: Diagnosis not present

## 2018-11-06 DIAGNOSIS — Z87442 Personal history of urinary calculi: Secondary | ICD-10-CM | POA: Diagnosis not present

## 2018-11-06 DIAGNOSIS — L231 Allergic contact dermatitis due to adhesives: Secondary | ICD-10-CM | POA: Diagnosis not present

## 2018-11-06 DIAGNOSIS — J449 Chronic obstructive pulmonary disease, unspecified: Secondary | ICD-10-CM | POA: Diagnosis not present

## 2018-11-06 DIAGNOSIS — I2510 Atherosclerotic heart disease of native coronary artery without angina pectoris: Secondary | ICD-10-CM | POA: Diagnosis not present

## 2018-11-06 DIAGNOSIS — F23 Brief psychotic disorder: Secondary | ICD-10-CM | POA: Diagnosis present

## 2018-11-06 DIAGNOSIS — F209 Schizophrenia, unspecified: Secondary | ICD-10-CM | POA: Diagnosis not present

## 2018-11-06 DIAGNOSIS — N179 Acute kidney failure, unspecified: Secondary | ICD-10-CM | POA: Diagnosis not present

## 2018-11-06 DIAGNOSIS — Z79899 Other long term (current) drug therapy: Secondary | ICD-10-CM | POA: Diagnosis not present

## 2018-11-06 DIAGNOSIS — Z9114 Patient's other noncompliance with medication regimen: Secondary | ICD-10-CM | POA: Diagnosis not present

## 2018-11-06 DIAGNOSIS — Z841 Family history of disorders of kidney and ureter: Secondary | ICD-10-CM

## 2018-11-06 DIAGNOSIS — E785 Hyperlipidemia, unspecified: Secondary | ICD-10-CM | POA: Diagnosis present

## 2018-11-06 DIAGNOSIS — Z03818 Encounter for observation for suspected exposure to other biological agents ruled out: Secondary | ICD-10-CM | POA: Diagnosis not present

## 2018-11-06 DIAGNOSIS — F333 Major depressive disorder, recurrent, severe with psychotic symptoms: Secondary | ICD-10-CM | POA: Diagnosis not present

## 2018-11-06 DIAGNOSIS — E876 Hypokalemia: Secondary | ICD-10-CM | POA: Diagnosis not present

## 2018-11-06 DIAGNOSIS — F250 Schizoaffective disorder, bipolar type: Secondary | ICD-10-CM | POA: Diagnosis not present

## 2018-11-06 DIAGNOSIS — Z20828 Contact with and (suspected) exposure to other viral communicable diseases: Secondary | ICD-10-CM | POA: Diagnosis not present

## 2018-11-06 DIAGNOSIS — F200 Paranoid schizophrenia: Secondary | ICD-10-CM | POA: Diagnosis not present

## 2018-11-06 DIAGNOSIS — F203 Undifferentiated schizophrenia: Secondary | ICD-10-CM | POA: Diagnosis not present

## 2018-11-06 DIAGNOSIS — R45851 Suicidal ideations: Secondary | ICD-10-CM | POA: Diagnosis not present

## 2018-11-06 HISTORY — DX: Calculus of kidney: N20.0

## 2018-11-06 LAB — CBC WITH DIFFERENTIAL/PLATELET
Abs Immature Granulocytes: 0.02 10*3/uL (ref 0.00–0.07)
Basophils Absolute: 0 10*3/uL (ref 0.0–0.1)
Basophils Relative: 0 %
Eosinophils Absolute: 0.1 10*3/uL (ref 0.0–0.5)
Eosinophils Relative: 1 %
HCT: 42.9 % (ref 36.0–46.0)
Hemoglobin: 13.7 g/dL (ref 12.0–15.0)
Immature Granulocytes: 0 %
Lymphocytes Relative: 24 %
Lymphs Abs: 1.9 10*3/uL (ref 0.7–4.0)
MCH: 29.7 pg (ref 26.0–34.0)
MCHC: 31.9 g/dL (ref 30.0–36.0)
MCV: 93.1 fL (ref 80.0–100.0)
Monocytes Absolute: 0.7 10*3/uL (ref 0.1–1.0)
Monocytes Relative: 8 %
Neutro Abs: 5.5 10*3/uL (ref 1.7–7.7)
Neutrophils Relative %: 67 %
Platelets: 332 10*3/uL (ref 150–400)
RBC: 4.61 MIL/uL (ref 3.87–5.11)
RDW: 13 % (ref 11.5–15.5)
WBC: 8.2 10*3/uL (ref 4.0–10.5)
nRBC: 0 % (ref 0.0–0.2)

## 2018-11-06 LAB — BASIC METABOLIC PANEL
Anion gap: 15 (ref 5–15)
Anion gap: 13 (ref 5–15)
BUN: 38 mg/dL — ABNORMAL HIGH (ref 6–20)
BUN: 32 mg/dL — ABNORMAL HIGH (ref 6–20)
CO2: 26 mmol/L (ref 22–32)
CO2: 24 mmol/L (ref 22–32)
Calcium: 9.4 mg/dL (ref 8.9–10.3)
Calcium: 9.1 mg/dL (ref 8.9–10.3)
Chloride: 103 mmol/L (ref 98–111)
Chloride: 107 mmol/L (ref 98–111)
Creatinine, Ser: 1.23 mg/dL — ABNORMAL HIGH (ref 0.44–1.00)
Creatinine, Ser: 1.13 mg/dL — ABNORMAL HIGH (ref 0.44–1.00)
GFR calc Af Amer: 56 mL/min — ABNORMAL LOW (ref >60)
GFR calc Af Amer: >60 mL/min (ref >60)
GFR calc non Af Amer: 48 mL/min — ABNORMAL LOW (ref >60)
GFR calc non Af Amer: 53 mL/min — ABNORMAL LOW (ref >60)
Glucose, Bld: 115 mg/dL — ABNORMAL HIGH (ref 70–99)
Glucose, Bld: 106 mg/dL — ABNORMAL HIGH (ref 70–99)
Potassium: 2.7 mmol/L — CL (ref 3.5–5.1)
Potassium: 3.1 mmol/L — ABNORMAL LOW (ref 3.5–5.1)
Sodium: 144 mmol/L (ref 135–145)
Sodium: 144 mmol/L (ref 135–145)

## 2018-11-06 LAB — CBC
HCT: 41.9 % (ref 36.0–46.0)
Hemoglobin: 13.8 g/dL (ref 12.0–15.0)
MCH: 29.7 pg (ref 26.0–34.0)
MCHC: 32.9 g/dL (ref 30.0–36.0)
MCV: 90.1 fL (ref 80.0–100.0)
Platelets: 383 10*3/uL (ref 150–400)
RBC: 4.65 MIL/uL (ref 3.87–5.11)
RDW: 12.9 % (ref 11.5–15.5)
WBC: 8.3 10*3/uL (ref 4.0–10.5)
nRBC: 0 % (ref 0.0–0.2)

## 2018-11-06 LAB — CREATININE, SERUM
Creatinine, Ser: 0.93 mg/dL (ref 0.44–1.00)
GFR calc Af Amer: >60 mL/min (ref >60)
GFR calc non Af Amer: >60 mL/min (ref >60)

## 2018-11-06 LAB — MAGNESIUM: Magnesium: 2.1 mg/dL (ref 1.7–2.4)

## 2018-11-06 LAB — SARS CORONAVIRUS 2 (TAT 6-24 HRS): SARS Coronavirus 2: NEGATIVE

## 2018-11-06 MED ORDER — POTASSIUM CHLORIDE 10 MEQ/100ML IV SOLN
10.0000 meq | Freq: Once | INTRAVENOUS | Status: AC
  Administered 2018-11-06: 11:00:00 10 meq via INTRAVENOUS
  Filled 2018-11-06: qty 100

## 2018-11-06 MED ORDER — SENNA 8.6 MG PO TABS
1.0000 | ORAL_TABLET | Freq: Two times a day (BID) | ORAL | Status: DC
  Administered 2018-11-06 – 2018-11-13 (×10): 8.6 mg via ORAL
  Filled 2018-11-06 (×15): qty 1

## 2018-11-06 MED ORDER — POTASSIUM CHLORIDE 20 MEQ/15ML (10%) PO SOLN
40.0000 meq | Freq: Once | ORAL | Status: AC
  Administered 2018-11-06: 40 meq via ORAL
  Filled 2018-11-06: qty 30

## 2018-11-06 MED ORDER — FLUOXETINE HCL 20 MG PO CAPS
40.0000 mg | ORAL_CAPSULE | Freq: Two times a day (BID) | ORAL | Status: DC
  Administered 2018-11-06 – 2018-11-13 (×11): 40 mg via ORAL
  Filled 2018-11-06 (×16): qty 2

## 2018-11-06 MED ORDER — ACETAMINOPHEN 650 MG RE SUPP: 650.0000 mg | Freq: Four times a day (QID) | RECTAL | Status: DC | PRN

## 2018-11-06 MED ORDER — ENOXAPARIN SODIUM 40 MG/0.4ML ~~LOC~~ SOLN
40.0000 mg | SUBCUTANEOUS | Status: DC
  Administered 2018-11-06: 40 mg via SUBCUTANEOUS
  Filled 2018-11-06 (×2): qty 0.4

## 2018-11-06 MED ORDER — POTASSIUM CHLORIDE CRYS ER 20 MEQ PO TBCR
20.0000 meq | EXTENDED_RELEASE_TABLET | Freq: Two times a day (BID) | ORAL | Status: AC
  Administered 2018-11-06 – 2018-11-07 (×3): 20 meq via ORAL
  Filled 2018-11-06 (×3): qty 1

## 2018-11-06 MED ORDER — HYDRALAZINE HCL 20 MG/ML IJ SOLN
5.0000 mg | INTRAMUSCULAR | Status: DC | PRN
  Administered 2018-11-06: 5 mg via INTRAVENOUS
  Filled 2018-11-06: qty 1

## 2018-11-06 MED ORDER — AMLODIPINE BESYLATE 5 MG PO TABS
5.0000 mg | ORAL_TABLET | Freq: Every day | ORAL | Status: DC
  Administered 2018-11-06 – 2018-11-08 (×3): 5 mg via ORAL
  Filled 2018-11-06 (×3): qty 1

## 2018-11-06 MED ORDER — ACETAMINOPHEN 325 MG PO TABS: 650.0000 mg | ORAL_TABLET | Freq: Four times a day (QID) | ORAL | Status: DC | PRN

## 2018-11-06 MED ORDER — DEXTROSE-NACL 5-0.45 % IV SOLN
INTRAVENOUS | Status: AC
  Administered 2018-11-06 – 2018-11-07 (×2): via INTRAVENOUS

## 2018-11-06 MED ORDER — SODIUM CHLORIDE 0.9 % IV BOLUS
1000.0000 mL | Freq: Once | INTRAVENOUS | Status: AC
  Administered 2018-11-06: 10:00:00 1000 mL via INTRAVENOUS

## 2018-11-06 NOTE — ED Notes (Signed)
Pt is NSR on monitor 

## 2018-11-06 NOTE — Progress Notes (Addendum)
Received patient from ED, awake but unable to answer questions, and is not coherent.

## 2018-11-06 NOTE — H&P (Signed)
History and Physical    Amy QualeBeverly Mcintire WUJ:[id number] DOB: 08/27/1960 DOA: 11/06/2018  PCP: Crosby Oysteravid Tysinger PA-C,  Behavioral health outpatient Dr. Mervyn SkeetersA Kumar(Confirm with patient/family/NH records and if not entered, this has to be entered at [facility] point of entry) Patient coming from: Home  I have personally briefly reviewed patient's old medical records in [facility]  Chief Complaint: Increased depression, anorexia with subsequent dehydration.  HPI: Amy Casey is a 58 y.o. female with medical history significant of schizophrenia with depression, hypertension and previous episodes of anorexia induced dehydration. Her husband reports that for several days she has been more depressed and mildly paranoid. She has had no appetite and has not been eating or taking PO fluids. He was concerned that she has become dehydrated and thus brings her tot he ED for evaluation.   ED Course: Patient hemodynamically stable. Per ED MDD not suicidal or homocidal. Labs c/w mild dehydrations with hypokalemia at 2.7, mild renal insufficiency with BUN 38, Cr 1.3. She has been given IVF. ED-MD recommends observation admission to replenish potassium and complete rehydration.   Review of Systems: As per HPI otherwise 10 point review of systems negative.    Past Medical History:  Diagnosis Date  . Hypertension   . Nephrolithiasis   . Schizophrenia [facility](HCC)         Past Surgical History:  Procedure Laterality Date  . WISDOM TOOTH EXTRACTION       has no history on file for tobacco, alcohol, and drug.  Not on File  Family History  Problem Relation Age of Onset  . Diabetes Mother   . Heart disease Mother   . Chronic Renal Failure Mother   . Cancer Maternal Uncle    SocHx - married x 2, 2 children, several grandchildren. 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 Lives with husband.   Prior to Admission medications   Medication Sig Start Date End Date Taking? Authorizing Provider   amLODipine (NORVASC) 5 MG tablet Take 5 mg by mouth daily.   Yes [provider]  FLUoxetine (PROZAC) 40 MG capsule Take 40 mg by mouth 2 (two) times daily.   Yes [provider]  zyprexa   20 mg                       qHS  Physical Exam: Vitals:   11/06/18 1130 11/06/18 1301 11/06/18 1302 11/06/18 1330  BP: (!) 185/90 (!) 185/96 (!) 185/96 (!) 198/99  Pulse: 83 (!) 106  73  Resp: 19 (!) 21  13  Temp:      TempSrc:      SpO2: 99% 98%  98%  Weight:      Height:        Constitutional: NAD, calm, comfortable Vitals:   11/06/18 1130 11/06/18 1301 11/06/18 1302 11/06/18 1330  BP: (!) 185/90 (!) 185/96 (!) 185/96 (!) 198/99  Pulse: 83 (!) 106  73  Resp: 19 (!) 21  13  Temp:      TempSrc:      SpO2: 99% 98%  98%  Weight:      Height:       General: WNWD woman in o distress. Soft spoken. Not a good historian Eyes: PERRL, lids and conjunctivae normal ENMT: Mucous membranes are dry. Posterior pharynx clear of any exudate or lesions.Normal dentition.  Neck: normal, supple, no masses, no thyromegaly Respiratory: clear to auscultation bilaterally, no wheezing, no crackles. Normal respiratory effort. No accessory muscle use.  Cardiovascular: Regular rate and rhythm, no murmurs / rubs / gallops. No extremity edema. 2+ pedal pulses. No carotid bruits.  Abdomen: no tenderness, no masses palpated. No hepatosplenomegaly. Bowel sounds positive.  Musculoskeletal: no clubbing / cyanosis. No joint deformity upper and lower extremities. Good ROM, no contractures. Normal muscle tone.  Skin: no rashes, lesions, ulcers. No induration Neurologic: CN 2-12 grossly intact. Sensation intact. Strength 5/5 in all 4.  Psychiatric: Oriented to person and place. Cannot relate why she is here. Depressed mood. Not talkative.     Labs on Admission: I have personally reviewed following labs and imaging studies  CBC: Recent Labs  Lab 11/06/18 1007  WBC 8.2  NEUTROABS 5.5  HGB 13.7  HCT  42.9  MCV 93.1  PLT 481   Basic Metabolic Panel: Recent Labs  Lab 11/06/18 1007 11/06/18 1247  NA 144 144  K 2.7* 3.1*  CL 103 107  CO2 26 24  GLUCOSE 115* 106*  BUN 38* 32*  CREATININE 1.23* 1.13*  CALCIUM 9.4 9.1  MG 2.1  --    GFR: Estimated Creatinine Clearance: 59 mL/min (A) (by C-G formula based on SCr of 1.13 mg/dL (H)). Liver Function Tests: No results for input(s): AST, ALT, ALKPHOS, BILITOT, PROT, ALBUMIN in the last 168 hours. No results for input(s): LIPASE, AMYLASE in the last 168 hours. No results for input(s): AMMONIA in the last 168 hours. Coagulation Profile: No results for input(s): INR, PROTIME in the last 168 hours. Cardiac Enzymes: No results for input(s): CKTOTAL, CKMB, CKMBINDEX, TROPONINI in the last 168 hours. BNP (last 3 results) No results for input(s): PROBNP in the last 8760 hours. HbA1C: No results for input(s): HGBA1C in the last 72 hours. CBG: No results for input(s): GLUCAP in the last 168 hours. Lipid Profile: No results for input(s): CHOL, HDL, LDLCALC, TRIG, CHOLHDL, LDLDIRECT in the last 72 hours. Thyroid Function Tests: No results for input(s): TSH, T4TOTAL, FREET4, T3FREE, THYROIDAB in the last 72 hours. Anemia Panel: No results for input(s): VITAMINB12, FOLATE, FERRITIN, TIBC, IRON, RETICCTPCT in the last 72 hours. Urine analysis: No results found for: COLORURINE, APPEARANCEUR, LABSPEC, PHURINE, GLUCOSEU, HGBUR, BILIRUBINUR, KETONESUR, PROTEINUR, UROBILINOGEN, NITRITE, LEUKOCYTESUR  Radiological Exams on Admission: No results found.  EKG: Independently reviewed. Sinus rhythm, LAE, no acute changes  Assessment/Plan Active Problems:   Dehydration   Schizophrenia ([place])   Essential hypertension  (please populate well all problems here in Problem List. (For example, if patient is on BP meds at home and you resume or decide to hold them, it is a problem that needs to be her. Same for CAD, COPD, HLD and so on)   1. Dehydration  - 2/2 inanition/anorexia with dry mm, mild prerenal azotemia, hypokalemia Plan obs admit for hydration - D5 1/2 NS at 100 cc/hr x 24 hrs  K replacement - Kdur 20 meq bid x 3 doses  Regular diet ` F/u Bmet in AM  2. Hypertensiion - elevated BP now. Asymptomatic. Question of having missed med doses Plan Continue home medication with amolodipine  3. Psych - h/o schizophrenia non-specific with episodes of depression.  Plan Continue zyprexa 20 mg qHS, fluoxetine 40mg  qD  For persistent anorexia or seriously depressed mood may need psych eval.   DVT prophylaxis: lovenox (Lovenox/Heparin/SCD's/anticoagulated/None (if comfort care) Code Status: full docde (Full/Partial (specify details) Family Communication: left message for husband on cell phone  [phone] [facility] name, relationship. Do not write "discussed with patient". Specify tel # if discussed over the phone) Disposition Plan:  home, 24 hrs (specify when and where you expect patient to be discharged) Consults called: none (with names) Admission status: obs (inpatient / obs / tele / medical floor / SDU)   Illene RegulusMichael Gerrard Crystal MD [facility] Pager [phone]  If 7PM-7AM, please contact night-coverage [email or web address] Password [email or web address]  11/06/2018, 1:40 PM

## 2018-11-06 NOTE — ED Provider Notes (Signed)
[facility] EMERGENCY DEPARTMENT Provider Note   CSN: [id number] Arrival date & time: 11/06/18  9622    History   Chief Complaint Chief Complaint  Patient presents with   Dehydration    HPI Amy Casey is a 58 y.o. female.     The history is provided by the patient and a caregiver.  Illness Location:  General Severity:  Mild Onset quality:  Gradual Timing:  Constant Progression:  Unchanged Chronicity:  New Context:  Husband concerned about dehydration, poor PO intake. Hx of schizophrenia and at times will have poor PO intake. Relieved by:  Nothing Worsened by:  Nothing Associated symptoms: no abdominal pain, no chest pain, no congestion, no cough, no ear pain, no fever, no rash, no shortness of breath, no sore throat and no vomiting     Past Medical History:  Diagnosis Date   Hypertension    Schizophrenia ([place])     There are no active problems to display for this patient.     OB History   No obstetric history on file.      Home Medications    Prior to Admission medications   Medication Sig Start Date End Date Taking? Authorizing Provider  amLODipine (NORVASC) 5 MG tablet Take 5 mg by mouth daily.   Yes [provider]  FLUoxetine (PROZAC) 40 MG capsule Take 40 mg by mouth 2 (two) times daily.   Yes [provider]    Family History No family history on file.  Social History Social History   Tobacco Use   Smoking status: Not on file  Substance Use Topics   Alcohol use: Not on file   Drug use: Not on file     Allergies   Patient has no allergy information on record.   Review of Systems Review of Systems  Constitutional: Negative for chills and fever.  HENT: Negative for congestion, ear pain and sore throat.   Eyes: Negative for pain and visual disturbance.  Respiratory: Negative for cough and shortness of breath.   Cardiovascular: Negative for chest pain and palpitations.  Gastrointestinal:  Negative for abdominal pain and vomiting.  Genitourinary: Negative for dysuria and hematuria.  Musculoskeletal: Negative for arthralgias and back pain.  Skin: Negative for color change and rash.  Neurological: Negative for seizures and syncope.  All other systems reviewed and are negative.    Physical Exam Updated Vital Signs  .[email or web address] Physical Exam Vitals signs and nursing note reviewed.  Constitutional:      General: She is not in acute distress.    Appearance: She is well-developed. She is not ill-appearing.  HENT:     Head: Normocephalic and atraumatic.     Right Ear: Tympanic membrane normal.     Left Ear: Tympanic membrane normal.     Nose: Nose normal.     Mouth/Throat:     Mouth: Mucous membranes are dry.  Eyes:     Extraocular Movements: Extraocular movements intact.     Conjunctiva/sclera: Conjunctivae normal.     Pupils: Pupils are equal, round, and reactive to light.  Neck:     Musculoskeletal: Normal range of motion and neck supple.  Cardiovascular:     Rate and Rhythm: Normal rate and regular rhythm.     Pulses: Normal pulses.     Heart sounds: Normal heart sounds. No murmur.  Pulmonary:     Effort: Pulmonary effort is normal. No respiratory distress.     Breath sounds: Normal breath sounds.  Abdominal:  General: Abdomen is flat. There is no distension.     Palpations: Abdomen is soft.     Tenderness: There is no abdominal tenderness.  Musculoskeletal: Normal range of motion.  Skin:    General: Skin is warm and dry.     Capillary Refill: Capillary refill takes less than 2 seconds.  Neurological:     General: No focal deficit present.     Mental Status: She is alert and oriented to person, place, and time.     Cranial Nerves: No cranial nerve deficit.     Sensory: No sensory deficit.     Motor: No weakness.  Psychiatric:        Mood and Affect: Mood normal.        Behavior: Behavior normal.        Thought Content: Thought content normal.         Judgment: Judgment normal.      ED Treatments / Results  Labs (all labs ordered are listed, but only abnormal results are displayed) Labs Reviewed  BASIC METABOLIC PANEL - Abnormal; Notable for the following components:      Result Value   Potassium 2.7 (*)    Glucose, Bld 115 (*)    BUN 38 (*)    Creatinine, Ser 1.23 (*)    GFR calc non Af Amer 48 (*)    GFR calc Af Amer 56 (*)    All other components within normal limits  SARS CORONAVIRUS 2  CBC WITH DIFFERENTIAL/PLATELET  MAGNESIUM  BASIC METABOLIC PANEL    EKG EKG Interpretation  Date/Time:  Tuesday November 06 2018 09:53:01 EDT Ventricular Rate:  73 PR Interval:    QRS Duration: 112 QT Interval:  442 QTC Calculation: 488 R Axis:   16 Text Interpretation:  Sinus rhythm Probable left atrial enlargement Borderline intraventricular conduction delay Borderline prolonged QT interval Confirmed by Virgina NorfolkAdam, Peighton Mehra [phone]([id number]) on 11/06/2018 11:11:02 AM   Radiology No results found.  Procedures Procedures (including critical care time)  Medications Ordered in ED Medications  potassium chloride 10 mEq in 100 mL IVPB (10 mEq Intravenous New Bag/Given 11/06/18 1118)  sodium chloride 0.9 % bolus 1,000 mL (1,000 mLs Intravenous New Bag/Given 11/06/18 1029)  potassium chloride 20 MEQ/15ML (10%) solution 40 mEq (40 mEq Oral Given 11/06/18 1117)     Initial Impression / Assessment and Plan / ED Course  I have reviewed the triage vital signs and the nursing notes.  Pertinent labs & imaging results that were available during my care of the patient were reviewed by me and considered in my medical decision making (see chart for details).        Amy Casey is a 58 year old female with history of hypertension, schizophrenia who presents the ED with concern for possible dehydration.  Patient is with her husband.  Patient with otherwise unremarkable vitals.  No fever.  Husband states that she has had some decreased oral intake over  the last day or so and concern for dehydration.  She has had issues with electrolytes in the past.  She denies any suicidal homicidal ideation.  Has not had any manic episodes.  From a psychiatric standpoint husband thinks that she is doing well.  She continues to take medications at night.  Patient does appear little bit dehydrated on exam.  Her mouth is dry.  Will give IV fluids and check basic labs.  Otherwise she appears neurologically intact.  He appears to be stable from a psychiatric standpoint.  Patient found to  have potassium of 2.7.  Creatinine is 1.2.  GFR is low, BUN is elevated.  Otherwise no significant leukocytosis or anemia.  EKG showed sinus rhythm however borderline prolonged QTc interval.  Patient appears clinically dehydrated on exam.  Has not been tolerating p.o. despite not having any GI symptoms.  Could be due to some underlying depression/psychiatric condition.  Has a history of this in the past.  Overall, do not have any old labs to compare to.  I believe she would benefit from an observation stay for hydration, advancement of diet, repletion of potassium and likely magnesium.  EKG overall is reassuring but is starting to show some changes that could be indicative of hypokalemia.  Internal medicine will admit the patient for further hydration and repletion of electrolytes.  Hemodynamically stable throughout my care.  'This chart was dictated using voice recognition software.  Despite best efforts to proofread,  errors can occur which can change the documentation meaning.  Final Clinical Impressions(s) / ED Diagnoses   Final diagnoses:  Dehydration  AKI (acute kidney injury) [facility](HCC)  Hypokalemia    ED Discharge Orders    None       Virgina NorfolkCuratolo, Elvan Ebron, DO 11/06/18 1149

## 2018-11-06 NOTE — ED Notes (Signed)
ED TO INPATIENT HANDOFF REPORT  ED Nurse Name and Phone #: Lorrin Goodell [phone]  S Name/Age/Gender Amy Casey 58 y.o. female Room/Bed: 027C/027C  Code Status   Code Status: Full Code  Home/SNF/Other Home Patient oriented to: self, place, time and situation Is this baseline? Yes   Triage Complete: Triage complete  Chief Complaint Lab work  Triage Note Husband at bedside reports pt has hx of schizophrenia and thinks her potassium might be low or high. Reports the pt didn't sleep well last night and was pacing. Pt states she sometimes hear voices.    Allergies Not on File  Level of Care/Admitting Diagnosis ED Disposition    ED Disposition Condition [facility] Area: [place] [[id number]]  Level of Care: Med-Surg [16]  I expect the patient will be discharged within 24 hours: Yes  LOW acuity---Tx typically complete <24 hrs---ACUTE conditions typically can be evaluated <24 hours---LABS likely to return to acceptable levels <24 hours---IS near functional baseline---EXPECTED to return to current living arrangement---NOT newly hypoxic: Meets criteria for 5C-Observation unit  Covid Evaluation: Asymptomatic Screening Protocol (No Symptoms)  Diagnosis: Dehydration [276.51.ICD-9-CM]  Admitting Physician: Neena Rhymes [[id number]]  Attending Physician: Adella Hare E [[id number]]  PT Class (Do Not Modify): Observation [104]  PT Acc Code (Do Not Modify): Observation [10022]       B Medical/Surgery History Past Medical History:  Diagnosis Date  . Hypertension   . Schizophrenia ([place])       A IV Location/Drains/Wounds Patient Lines/Drains/Airways Status   Active Line/Drains/Airways    Name:   Placement date:   Placement time:   Site:   Days:   Peripheral IV 11/06/18 Left Hand   11/06/18    1020    Hand   less than 1          Intake/Output Last 24 hours  Intake/Output Summary (Last 24 hours) at 11/06/2018 1303 Last data filed at 11/06/2018  1258 Gross per 24 hour  Intake 1100 ml  Output -  Net 1100 ml    Labs/Imaging Results for orders placed or performed during the hospital encounter of 11/06/18 (from the past 48 hour(s))  CBC with Differential     Status: None   Collection Time: 11/06/18 10:07 AM  Result Value Ref Range   WBC 8.2 4.0 - 10.5 K/uL   RBC 4.61 3.87 - 5.11 MIL/uL   Hemoglobin 13.7 12.0 - 15.0 g/dL   HCT 42.9 36.0 - 46.0 %   MCV 93.1 80.0 - 100.0 fL   MCH 29.7 26.0 - 34.0 pg   MCHC 31.9 30.0 - 36.0 g/dL   RDW 13.0 11.5 - 15.5 %   Platelets 332 150 - 400 K/uL   nRBC 0.0 0.0 - 0.2 %   Neutrophils Relative % 67 %   Neutro Abs 5.5 1.7 - 7.7 K/uL   Lymphocytes Relative 24 %   Lymphs Abs 1.9 0.7 - 4.0 K/uL   Monocytes Relative 8 %   Monocytes Absolute 0.7 0.1 - 1.0 K/uL   Eosinophils Relative 1 %   Eosinophils Absolute 0.1 0.0 - 0.5 K/uL   Basophils Relative 0 %   Basophils Absolute 0.0 0.0 - 0.1 K/uL   Immature Granulocytes 0 %   Abs Immature Granulocytes 0.02 0.00 - 0.07 K/uL    Comment: Performed at [facility], [address], [place], [place] [address]  Basic metabolic panel     Status: Abnormal   Collection Time: 11/06/18 10:07  AM  Result Value Ref Range   Sodium 144 135 - 145 mmol/L   Potassium 2.7 (LL) 3.5 - 5.1 mmol/L    Comment: CRITICAL RESULT CALLED TO, READ BACK BY AND VERIFIED WITH: BARBOUR,M RN @1059  ON [id number] BY FLEMINGS    Chloride 103 98 - 111 mmol/L   CO2 26 22 - 32 mmol/L   Glucose, Bld 115 (H) 70 - 99 mg/dL   BUN 38 (H) 6 - 20 mg/dL   Creatinine, Ser 9.561.23 (H) 0.44 - 1.00 mg/dL   Calcium 9.4 8.9 - 21.310.3 mg/dL   GFR calc non Af Amer 48 (L) >60 mL/min   GFR calc Af Amer 56 (L) >60 mL/min   Anion gap 15 5 - 15    Comment: Performed at [facility], [address] [place], [place] [address]  Magnesium     Status: None   Collection Time: 11/06/18 10:07 AM  Result Value Ref Range   Magnesium 2.1 1.7 - 2.4 mg/dL    Comment: Performed at [facility],  [address] [place], [place] [address]   No results found.  Pending Labs Unresulted Labs (From admission, onward)    Start     Ordered   11/13/18 0500  Creatinine, serum  (enoxaparin (LOVENOX)    CrCl >/= 30 ml/min)  Weekly,   R    Comments: while on enoxaparin therapy    11/06/18 1256   11/07/18 0500  Basic metabolic panel  Tomorrow morning,   R     11/06/18 1256   11/06/18 1253  HIV antibody (Routine Testing)  Once,   STAT     11/06/18 1256   11/06/18 1253  CBC  (enoxaparin (LOVENOX)    CrCl >/= 30 ml/min)  Once,   STAT    Comments: Baseline for enoxaparin therapy IF NOT ALREADY DRAWN.  Notify MD if PLT < 100 K.    11/06/18 1256   11/06/18 1253  Creatinine, serum  (enoxaparin (LOVENOX)    CrCl >/= 30 ml/min)  Once,   STAT    Comments: Baseline for enoxaparin therapy IF NOT ALREADY DRAWN.    11/06/18 1256   11/06/18 1148  SARS CORONAVIRUS 2 Nasal Swab Aptima Multi Swab  (Asymptomatic/Tier 2 Patients Labs)  Once,   STAT    Question Answer Comment  Is this test for diagnosis or screening Screening   Symptomatic for COVID-19 as defined by CDC No   Hospitalized for COVID-19 No   Admitted to ICU for COVID-19 No   Previously tested for COVID-19 No   Resident in a congregate (group) care setting No   Employed in healthcare setting No   Pregnant No      11/06/18 1147   11/06/18 1134  Basic metabolic panel  ONCE - STAT,   STAT     11/06/18 1133          Vitals/Pain Today's Vitals   11/06/18 1116 11/06/18 1130 11/06/18 1301 11/06/18 1302  BP: (!) 197/89 (!) 185/90 (!) 185/96 (!) 185/96  Pulse: 80 83 (!) 106   Resp: 18 19 (!) 21   Temp:      TempSrc:      SpO2: 100% 99% 98%   Weight:      Height:      PainSc:        Isolation Precautions No active isolations  Medications Medications  amLODipine (NORVASC) tablet 5 mg (5 mg Oral Given 11/06/18 1302)  FLUoxetine (PROZAC) capsule 40  mg (has no administration in time range)  enoxaparin (LOVENOX) injection 40 mg (has no  administration in time range)  dextrose 5 %-0.45 % sodium chloride infusion (has no administration in time range)  acetaminophen (TYLENOL) tablet 650 mg (has no administration in time range)    Or  acetaminophen (TYLENOL) suppository 650 mg (has no administration in time range)  senna (SENOKOT) tablet 8.6 mg (has no administration in time range)  potassium chloride SA (K-DUR) CR tablet 20 mEq (has no administration in time range)  sodium chloride 0.9 % bolus 1,000 mL (0 mLs Intravenous Stopped 11/06/18 1231)  potassium chloride 20 MEQ/15ML (10%) solution 40 mEq (40 mEq Oral Given 11/06/18 1117)  potassium chloride 10 mEq in 100 mL IVPB (0 mEq Intravenous Stopped 11/06/18 1258)    Mobility walks Low fall risk   Focused Assessments    R Recommendations: See Admitting Provider Note  Report given to:   Additional Notes:

## 2018-11-06 NOTE — ED Notes (Signed)
Dr. Linda Hedges notified of pt's blood pressure.  New order will be placed for PRN blood pressure med.

## 2018-11-06 NOTE — ED Notes (Signed)
Patient walked to the bathroom patient did well 
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
## 2018-11-06 NOTE — ED Notes (Signed)
Pt watching tv

## 2018-11-06 NOTE — ED Triage Notes (Signed)
Husband at bedside reports pt has hx of schizophrenia and thinks her potassium might be low or high. Reports the pt didn't sleep well last night and was pacing. Pt states she sometimes hear voices.

## 2018-11-07 DIAGNOSIS — F23 Brief psychotic disorder: Secondary | ICD-10-CM | POA: Diagnosis not present

## 2018-11-07 DIAGNOSIS — F322 Major depressive disorder, single episode, severe without psychotic features: Secondary | ICD-10-CM

## 2018-11-07 DIAGNOSIS — E876 Hypokalemia: Secondary | ICD-10-CM | POA: Diagnosis not present

## 2018-11-07 DIAGNOSIS — I10 Essential (primary) hypertension: Secondary | ICD-10-CM | POA: Diagnosis not present

## 2018-11-07 DIAGNOSIS — F209 Schizophrenia, unspecified: Secondary | ICD-10-CM | POA: Diagnosis not present

## 2018-11-07 DIAGNOSIS — E860 Dehydration: Secondary | ICD-10-CM | POA: Diagnosis not present

## 2018-11-07 LAB — HIV ANTIBODY (ROUTINE TESTING W REFLEX): HIV Screen 4th Generation wRfx: NONREACTIVE

## 2018-11-07 LAB — BASIC METABOLIC PANEL
Anion gap: 11 (ref 5–15)
BUN: 19 mg/dL (ref 6–20)
CO2: 28 mmol/L (ref 22–32)
Calcium: 9 mg/dL (ref 8.9–10.3)
Chloride: 103 mmol/L (ref 98–111)
Creatinine, Ser: 0.85 mg/dL (ref 0.44–1.00)
GFR calc Af Amer: >60 mL/min (ref >60)
GFR calc non Af Amer: >60 mL/min (ref >60)
Glucose, Bld: 129 mg/dL — ABNORMAL HIGH (ref 70–99)
Potassium: 2.9 mmol/L — ABNORMAL LOW (ref 3.5–5.1)
Sodium: 142 mmol/L (ref 135–145)

## 2018-11-07 LAB — MAGNESIUM: Magnesium: 1.9 mg/dL (ref 1.7–2.4)

## 2018-11-07 MED ORDER — KCL IN DEXTROSE-NACL 40-5-0.9 MEQ/L-%-% IV SOLN
INTRAVENOUS | Status: DC
  Administered 2018-11-07 – 2018-11-12 (×12): via INTRAVENOUS
  Filled 2018-11-07 (×15): qty 1000

## 2018-11-07 MED ORDER — HYDRALAZINE HCL 20 MG/ML IJ SOLN
10.0000 mg | Freq: Four times a day (QID) | INTRAMUSCULAR | Status: DC | PRN
  Administered 2018-11-10: 10 mg via INTRAVENOUS
  Filled 2018-11-07: qty 1

## 2018-11-07 MED ORDER — POTASSIUM CHLORIDE 2 MEQ/ML IV SOLN: INTRAVENOUS | Status: DC

## 2018-11-07 MED ORDER — POTASSIUM CHLORIDE CRYS ER 20 MEQ PO TBCR
40.0000 meq | EXTENDED_RELEASE_TABLET | ORAL | Status: AC
  Administered 2018-11-07 (×2): 40 meq via ORAL
  Filled 2018-11-07 (×2): qty 2

## 2018-11-07 NOTE — Social Work (Signed)
Per MD he has not been able to reach family today. CSW left HIPAA compliant message on all three numbers on facesheet: [phone], [phone], and [phone]. All three numbers went straight to voice message. Message instructs them to call [phone]; RN Secretary aware if she gets call from family to update MD.   CSW continuing to follow if there continues to be no answer then will consider a wellness check.   Westley Hummer, MSW, [place] Work [phone]

## 2018-11-07 NOTE — Progress Notes (Addendum)
PROGRESS NOTE    Amy Casey  ZOX:[id number] DOB: 04/29/1960 DOA: 11/06/2018 PCP: Patient, No Pcp Per   Brief Narrative:  Amy Casey is a 58 y.o. female with medical history significant of schizophrenia with depression, hypertension and previous episodes of anorexia induced dehydration was brought into the ER on 11/06/2018 by her husband due to her developing severe depression with paranoia leading to anorexia and dehydration.  Evaluation in the emergency department revealed that she was hemodynamically stable however she had a KI as well as dehydration and hypokalemia.  She was admitted to hospital service.  Received IV fluids and some potassium replacement.  Assessment & Plan:   Active Problems:   Dehydration   Essential hypertension   Schizophrenia (HCC)   Hypokalemia   Severe depression (HCC)   History of schizophrenia and depression with acute on chronic severe depression: Patient currently seems to be significantly depressed and is monotone is with flat affect.  She can barely talk and is stiff.  She is oriented only x2.  I suspect she is going to require inpatient psychiatry treatment.  I have consulted psychiatry.  We will continue current psychiatric medications in the meantime.  Acute kidney injury: Resolved. we will restart her on IV fluids once again due to my concern that she is not going to eat or drink.  Hypokalemia: 2.9.  Will replace orally and through IV fluids.  Recheck in the morning.  Monitor on telemetry.  Hypertension: Blood pressure slightly elevated but significantly improved compared to yesterday.  She is on amlodipine 5 mg p.o. daily which I will continue.  I will add PRN hydralazine.  DVT prophylaxis: Lovenox Code Status: Full code Family Communication: Attempted to call patient's husband Mr. Ashok PallClarence Casey at number provided in the chart [phone] with no answer, voicemail left.  Attempted to call patient's son Amy Casey at number provided  [phone] and left a voicemail. Disposition Plan: TBD.  Psychiatry evaluation pending.  Consultants:   Psychiatry  Procedures:   None  Antimicrobials:   None   Subjective: Patient seen and examined.  Initially, she barely talk to me despite of asking several questions and prompting.  She started talking after few minutes and had monotone as tone and was barely able to complete sentences and was stiff with flat affect.  Showing signs of severe depression.  Objective: Vitals:   11/06/18 1901 11/06/18 2142 11/07/18 0453 11/07/18 1422  BP: (!) 174/74 (!) 155/78 (!) 168/92 (!) 147/77  Pulse: 62 84 82 78  Resp: 14 18 18 16   Temp: 97.9 F (36.6 C) 98.4 F (36.9 C) 98.2 F (36.8 C) 98.2 F (36.8 C)  TempSrc: Axillary Oral Oral Oral  SpO2: 99% 100% 99% 98%  Weight: 77.1 kg     Height: 5\' 10"  (1.778 m)       Intake/Output Summary (Last 24 hours) at 11/07/2018 1526 Last data filed at 11/07/2018 1427 Gross per 24 hour  Intake 1104.77 ml  Output 150 ml  Net 954.77 ml   Filed Weights   11/06/18 0954 11/06/18 1901  Weight: 74.8 kg 77.1 kg    Examination:  General exam: Appears calm and comfortable but depressed Respiratory system: Clear to auscultation. Respiratory effort normal. Cardiovascular system: S1 & S2 heard, RRR. No JVD, murmurs, rubs, gallops or clicks. No pedal edema. Gastrointestinal system: Abdomen is nondistended, soft and nontender. No organomegaly or masses felt. Normal bowel sounds heard. Central nervous system: Alert and oriented x1. No focal neurological deficits. Extremities: Symmetric 5 x  5 power. Skin: No rashes, lesions or ulcers Psychiatry: Judgement and insight appear poor. Mood & affect flat   Data Reviewed: I have personally reviewed following labs and imaging studies  CBC: Recent Labs  Lab 11/06/18 1007 11/06/18 1253  WBC 8.2 8.3  NEUTROABS 5.5  --   HGB 13.7 13.8  HCT 42.9 41.9  MCV 93.1 90.1  PLT 332 774   Basic Metabolic Panel:  Recent Labs  Lab 11/06/18 1007 11/06/18 1247 11/06/18 1253 11/07/18 0530  NA 144 144  --  142  K 2.7* 3.1*  --  2.9*  CL 103 107  --  103  CO2 26 24  --  28  GLUCOSE 115* 106*  --  129*  BUN 38* 32*  --  19  CREATININE 1.23* 1.13* 0.93 0.85  CALCIUM 9.4 9.1  --  9.0  MG 2.1  --   --   --    GFR: Estimated Creatinine Clearance: 77.1 mL/min (by C-G formula based on SCr of 0.85 mg/dL). Liver Function Tests: No results for input(s): AST, ALT, ALKPHOS, BILITOT, PROT, ALBUMIN in the last 168 hours. No results for input(s): LIPASE, AMYLASE in the last 168 hours. No results for input(s): AMMONIA in the last 168 hours. Coagulation Profile: No results for input(s): INR, PROTIME in the last 168 hours. Cardiac Enzymes: No results for input(s): CKTOTAL, CKMB, CKMBINDEX, TROPONINI in the last 168 hours. BNP (last 3 results) No results for input(s): PROBNP in the last 8760 hours. HbA1C: No results for input(s): HGBA1C in the last 72 hours. CBG: No results for input(s): GLUCAP in the last 168 hours. Lipid Profile: No results for input(s): CHOL, HDL, LDLCALC, TRIG, CHOLHDL, LDLDIRECT in the last 72 hours. Thyroid Function Tests: No results for input(s): TSH, T4TOTAL, FREET4, T3FREE, THYROIDAB in the last 72 hours. Anemia Panel: No results for input(s): VITAMINB12, FOLATE, FERRITIN, TIBC, IRON, RETICCTPCT in the last 72 hours. Sepsis Labs: No results for input(s): PROCALCITON, LATICACIDVEN in the last 168 hours.  Recent Results (from the past 240 hour(s))  SARS CORONAVIRUS 2 Nasal Swab Aptima Multi Swab     Status: None   Collection Time: 11/06/18 12:27 PM   Specimen: Aptima Multi Swab; Nasal Swab  Result Value Ref Range Status   SARS Coronavirus 2 NEGATIVE NEGATIVE Final    Comment: (NOTE) SARS-CoV-2 target nucleic acids are NOT DETECTED. The SARS-CoV-2 RNA is generally detectable in upper and lower respiratory specimens during the acute phase of infection. Negative results do not  preclude SARS-CoV-2 infection, do not rule out co-infections with other pathogens, and should not be used as the sole basis for treatment or other patient management decisions. Negative results must be combined with clinical observations, patient history, and epidemiological information. The expected result is Negative. Fact Sheet for Patients: SugarRoll.be Fact Sheet for Healthcare Providers: https://www.woods-mathews.com/ This test is not yet approved or cleared by the [place] FDA and  has been authorized for detection and/or diagnosis of SARS-CoV-2 by FDA under an Emergency Use Authorization (EUA). This EUA will remain  in effect (meaning this test can be used) for the duration of the COVID-19 declaration under Section 56 4(b)(1) of the Act, 21 U.S.C. section 360bbb-3(b)(1), unless the authorization is terminated or revoked sooner. Performed at [facility], [place] [address], [place], [place] [address]       Radiology Studies: No results found.  Scheduled Meds: . amLODipine  5 mg Oral Daily  . enoxaparin (LOVENOX) injection  40 mg Subcutaneous  Q24H  . FLUoxetine  40 mg Oral BID  . potassium chloride  40 mEq Oral Q4H  . senna  1 tablet Oral BID   Continuous Infusions: . dextrose 5 % and 0.9 % NaCl with KCl 40 mEq/L       LOS: 0 days   Time spent: 39 minutes   Hughie Clossavi Rupal Childress, MD [facility] Pager [phone]  If 7PM-7AM, please contact night-coverage [email or web address] Password [email or web address] 11/07/2018, 3:26 PM

## 2018-11-08 DIAGNOSIS — Z87442 Personal history of urinary calculi: Secondary | ICD-10-CM | POA: Diagnosis not present

## 2018-11-08 DIAGNOSIS — E876 Hypokalemia: Secondary | ICD-10-CM | POA: Diagnosis not present

## 2018-11-08 DIAGNOSIS — Z751 Person awaiting admission to adequate facility elsewhere: Secondary | ICD-10-CM | POA: Diagnosis not present

## 2018-11-08 DIAGNOSIS — Z79899 Other long term (current) drug therapy: Secondary | ICD-10-CM | POA: Diagnosis not present

## 2018-11-08 DIAGNOSIS — Z841 Family history of disorders of kidney and ureter: Secondary | ICD-10-CM | POA: Diagnosis not present

## 2018-11-08 DIAGNOSIS — F333 Major depressive disorder, recurrent, severe with psychotic symptoms: Secondary | ICD-10-CM | POA: Diagnosis present

## 2018-11-08 DIAGNOSIS — Z8249 Family history of ischemic heart disease and other diseases of the circulatory system: Secondary | ICD-10-CM | POA: Diagnosis not present

## 2018-11-08 DIAGNOSIS — J449 Chronic obstructive pulmonary disease, unspecified: Secondary | ICD-10-CM | POA: Diagnosis present

## 2018-11-08 DIAGNOSIS — F200 Paranoid schizophrenia: Secondary | ICD-10-CM

## 2018-11-08 DIAGNOSIS — Z20828 Contact with and (suspected) exposure to other viral communicable diseases: Secondary | ICD-10-CM | POA: Diagnosis present

## 2018-11-08 DIAGNOSIS — N179 Acute kidney failure, unspecified: Secondary | ICD-10-CM | POA: Diagnosis present

## 2018-11-08 DIAGNOSIS — E785 Hyperlipidemia, unspecified: Secondary | ICD-10-CM | POA: Diagnosis present

## 2018-11-08 DIAGNOSIS — I10 Essential (primary) hypertension: Secondary | ICD-10-CM | POA: Diagnosis not present

## 2018-11-08 DIAGNOSIS — F209 Schizophrenia, unspecified: Secondary | ICD-10-CM | POA: Diagnosis not present

## 2018-11-08 DIAGNOSIS — F23 Brief psychotic disorder: Secondary | ICD-10-CM | POA: Diagnosis not present

## 2018-11-08 DIAGNOSIS — Z833 Family history of diabetes mellitus: Secondary | ICD-10-CM | POA: Diagnosis not present

## 2018-11-08 DIAGNOSIS — E860 Dehydration: Secondary | ICD-10-CM | POA: Diagnosis not present

## 2018-11-08 DIAGNOSIS — I2510 Atherosclerotic heart disease of native coronary artery without angina pectoris: Secondary | ICD-10-CM | POA: Diagnosis present

## 2018-11-08 LAB — BASIC METABOLIC PANEL
Anion gap: 11 (ref 5–15)
BUN: 7 mg/dL (ref 6–20)
CO2: 23 mmol/L (ref 22–32)
Calcium: 9 mg/dL (ref 8.9–10.3)
Chloride: 106 mmol/L (ref 98–111)
Creatinine, Ser: 0.76 mg/dL (ref 0.44–1.00)
GFR calc Af Amer: >60 mL/min (ref >60)
GFR calc non Af Amer: >60 mL/min (ref >60)
Glucose, Bld: 111 mg/dL — ABNORMAL HIGH (ref 70–99)
Potassium: 4 mmol/L (ref 3.5–5.1)
Sodium: 140 mmol/L (ref 135–145)

## 2018-11-08 LAB — CBC WITH DIFFERENTIAL/PLATELET
Abs Immature Granulocytes: 0.03 10*3/uL (ref 0.00–0.07)
Basophils Absolute: 0 10*3/uL (ref 0.0–0.1)
Basophils Relative: 0 %
Eosinophils Absolute: 0.2 10*3/uL (ref 0.0–0.5)
Eosinophils Relative: 3 %
HCT: 44.2 % (ref 36.0–46.0)
Hemoglobin: 14.5 g/dL (ref 12.0–15.0)
Immature Granulocytes: 0 %
Lymphocytes Relative: 23 %
Lymphs Abs: 1.7 10*3/uL (ref 0.7–4.0)
MCH: 30.1 pg (ref 26.0–34.0)
MCHC: 32.8 g/dL (ref 30.0–36.0)
MCV: 91.7 fL (ref 80.0–100.0)
Monocytes Absolute: 0.5 10*3/uL (ref 0.1–1.0)
Monocytes Relative: 7 %
Neutro Abs: 5 10*3/uL (ref 1.7–7.7)
Neutrophils Relative %: 67 %
Platelets: 423 10*3/uL — ABNORMAL HIGH (ref 150–400)
RBC: 4.82 MIL/uL (ref 3.87–5.11)
RDW: 12.8 % (ref 11.5–15.5)
WBC: 7.5 10*3/uL (ref 4.0–10.5)
nRBC: 0 % (ref 0.0–0.2)

## 2018-11-08 LAB — MAGNESIUM: Magnesium: 1.7 mg/dL (ref 1.7–2.4)

## 2018-11-08 MED ORDER — OLANZAPINE 5 MG PO TABS
5.0000 mg | ORAL_TABLET | Freq: Once | ORAL | Status: AC
  Administered 2018-11-08: 5 mg via ORAL
  Filled 2018-11-08: qty 1

## 2018-11-08 MED ORDER — OLANZAPINE 10 MG PO TABS
10.0000 mg | ORAL_TABLET | Freq: Every day | ORAL | Status: DC
  Administered 2018-11-08 – 2018-11-13 (×4): 10 mg via ORAL
  Filled 2018-11-08 (×6): qty 1

## 2018-11-08 NOTE — Social Work (Signed)
CSW aware per Teacher, music that pt son had called and pt had a visitor yesterday evening. Aware psychiatry consult ordered, following for recommendations.   Westley Hummer, MSW, [place] Work [phone]

## 2018-11-08 NOTE — Social Work (Addendum)
5:25pm- pt transferred to 5W14; IVCs on chart confirmed with RN secretary, [employer] called to serve pt. Hand off given to [place].   2:05pm- Acknowledging pt appropriateness for IVC.  IVC completed, will call [employer] to serve pt.   Will make referral to [facility] and [facility].  Westley Hummer, MSW, [place] Work [phone]

## 2018-11-08 NOTE — Progress Notes (Signed)
PROGRESS NOTE    Amy Casey  TSV:[id number] DOB: 25-Aug-1960 DOA: 11/06/2018 PCP: Patient, No Pcp Per   Brief Narrative:  Amy Casey is a 58 y.o. female with medical history significant of schizophrenia with depression, hypertension and previous episodes of anorexia induced dehydration was brought into the ER on 11/06/2018 by her husband due to her developing severe depression with paranoia leading to anorexia and dehydration.  Evaluation in the emergency department revealed that she was hemodynamically stable however she had a KI as well as dehydration and hypokalemia.  She was admitted to hospital service.  Received IV fluids and some potassium replacement.  She had all the signs of severe depression.  Psychiatry was consulted on 11/07/2018.  Pending evaluation.  Assessment & Plan:   Active Problems:   Dehydration   Essential hypertension   Schizophrenia ([place])   Hypokalemia   Severe depression (HCC)   History of schizophrenia and depression with acute on chronic severe depression: Patient currently seems to be significantly depressed and is monotone is with flat affect.  She can barely talk and is stiff.  She is oriented only x2.  I suspect she is going to require inpatient psychiatry treatment.  I had consulted psychiatry on 11/07/2018, pending evaluation.  We will continue current psychiatric medications in the meantime.  Patient is medically ready.  She has no medical reasons to be on the medical floor.  I suspect she will require inpatient psychiatric care.  She can be [facility] to [facility] if psychiatry recommends and when bed available.  Acute kidney injury: Resolved. we will restart her on IV fluids once again due to my concern that she is not going to eat or drink.  Hypokalemia: Resolved.  Hypertension: Blood pressure better than yesterday.  Continue Klonopin 5 mg p.o. daily and PRN hydralazine.  DVT prophylaxis: Lovenox Code Status: Full code Family Communication: Attempted  to call patient's husband Mr. Amy Casey on 11/07/2018 at number provided in the chart [phone] with no answer, voicemail left.  Social worker also Patent attorney.  As of today, I have never received any call back from husband.  Attempted to call patient's son Arrie Aran at number provided [phone] and left a voicemail.  He then returned the call.  I talked to him and updated him about patient's status. Disposition Plan: TBD.  Psychiatry evaluation pending.  Will likely need to be transferred to [facility].  Consultants:   Psychiatry  Procedures:   None  Antimicrobials:   None   Subjective: Patient seen and examined.  She was slightly better than yesterday however she was still monotone is with soft voice and was not able to complete her sentences.  Avoiding eye contact.  Objective: Vitals:   11/07/18 0453 11/07/18 1422 11/07/18 2108 11/08/18 0525  BP: (!) 168/92 (!) 147/77 140/88 (!) 153/76  Pulse: 82 78 71 68  Resp: 18 16 18 16   Temp: 98.2 F (36.8 C) 98.2 F (36.8 C) 97.7 F (36.5 C) (!) 97.3 F (36.3 C)  TempSrc: Oral Oral Oral Oral  SpO2: 99% 98% 100% 100%  Weight:      Height:        Intake/Output Summary (Last 24 hours) at 11/08/2018 1031 Last data filed at 11/08/2018 0700 Gross per 24 hour  Intake 1184.9 ml  Output 400 ml  Net 784.9 ml   Filed Weights   11/06/18 0954 11/06/18 1901  Weight: 74.8 kg 77.1 kg    Examination:  General exam: Appears calm and comfortable  Respiratory  system: Clear to auscultation. Respiratory effort normal. Cardiovascular system: S1 & S2 heard, RRR. No JVD, murmurs, rubs, gallops or clicks. No pedal edema. Gastrointestinal system: Abdomen is nondistended, soft and nontender. No organomegaly or masses felt. Normal bowel sounds heard. Central nervous system: Alert and oriented. No focal neurological deficits. Extremities: Symmetric 5 x 5 power. Skin: No rashes, lesions or ulcers Psychiatry: Judgement and insight appear POOR.  Mood & affect flat.    Data Reviewed: I have personally reviewed following labs and imaging studies  CBC: Recent Labs  Lab 11/06/18 1007 11/06/18 1253 11/08/18 0831  WBC 8.2 8.3 7.5  NEUTROABS 5.5  --  5.0  HGB 13.7 13.8 14.5  HCT 42.9 41.9 44.2  MCV 93.1 90.1 91.7  PLT 332 383 423*   Basic Metabolic Panel: Recent Labs  Lab 11/06/18 1007 11/06/18 1247 11/06/18 1253 11/07/18 0530 11/07/18 1528 11/08/18 0831  NA 144 144  --  142  --  140  K 2.7* 3.1*  --  2.9*  --  4.0  CL 103 107  --  103  --  106  CO2 26 24  --  28  --  23  GLUCOSE 115* 106*  --  129*  --  111*  BUN 38* 32*  --  19  --  7  CREATININE 1.23* 1.13* 0.93 0.85  --  0.76  CALCIUM 9.4 9.1  --  9.0  --  9.0  MG 2.1  --   --   --  1.9 1.7   GFR: Estimated Creatinine Clearance: 81.9 mL/min (by C-G formula based on SCr of 0.76 mg/dL). Liver Function Tests: No results for input(s): AST, ALT, ALKPHOS, BILITOT, PROT, ALBUMIN in the last 168 hours. No results for input(s): LIPASE, AMYLASE in the last 168 hours. No results for input(s): AMMONIA in the last 168 hours. Coagulation Profile: No results for input(s): INR, PROTIME in the last 168 hours. Cardiac Enzymes: No results for input(s): CKTOTAL, CKMB, CKMBINDEX, TROPONINI in the last 168 hours. BNP (last 3 results) No results for input(s): PROBNP in the last 8760 hours. HbA1C: No results for input(s): HGBA1C in the last 72 hours. CBG: No results for input(s): GLUCAP in the last 168 hours. Lipid Profile: No results for input(s): CHOL, HDL, LDLCALC, TRIG, CHOLHDL, LDLDIRECT in the last 72 hours. Thyroid Function Tests: No results for input(s): TSH, T4TOTAL, FREET4, T3FREE, THYROIDAB in the last 72 hours. Anemia Panel: No results for input(s): VITAMINB12, FOLATE, FERRITIN, TIBC, IRON, RETICCTPCT in the last 72 hours. Sepsis Labs: No results for input(s): PROCALCITON, LATICACIDVEN in the last 168 hours.  Recent Results (from the past 240 hour(s))  SARS  CORONAVIRUS 2 Nasal Swab Aptima Multi Swab     Status: None   Collection Time: 11/06/18 12:27 PM   Specimen: Aptima Multi Swab; Nasal Swab  Result Value Ref Range Status   SARS Coronavirus 2 NEGATIVE NEGATIVE Final    Comment: (NOTE) SARS-CoV-2 target nucleic acids are NOT DETECTED. The SARS-CoV-2 RNA is generally detectable in upper and lower respiratory specimens during the acute phase of infection. Negative results do not preclude SARS-CoV-2 infection, do not rule out co-infections with other pathogens, and should not be used as the sole basis for treatment or other patient management decisions. Negative results must be combined with clinical observations, patient history, and epidemiological information. The expected result is Negative. Fact Sheet for Patients: HairSlick.nohttps://www.fda.gov/media/138098/download Fact Sheet for Healthcare Providers: quierodirigir.comhttps://www.fda.gov/media/138095/download This test is not yet approved or cleared by the [place]  FDA and  has been authorized for detection and/or diagnosis of SARS-CoV-2 by FDA under an Emergency Use Authorization (EUA). This EUA will remain  in effect (meaning this test can be used) for the duration of the COVID-19 declaration under Section 56 4(b)(1) of the Act, 21 U.S.C. section 360bbb-3(b)(1), unless the authorization is terminated or revoked sooner. Performed at [facility], [address] [place], [place] [address]       Radiology Studies: No results found.  Scheduled Meds: . amLODipine  5 mg Oral Daily  . FLUoxetine  40 mg Oral BID  . senna  1 tablet Oral BID   Continuous Infusions: . dextrose 5 % and 0.9 % NaCl with KCl 40 mEq/L 125 mL/hr at 11/08/18 0534     LOS: 0 days   Time spent: 28 minutes   Hughie Clossavi Kayti Poss, MD [facility] Pager [phone]  If 7PM-7AM, please contact night-coverage [email or web address] Password [email or web address] 11/08/2018, 10:31 AM

## 2018-11-08 NOTE — Consult Note (Signed)
Telepsych Consultation   Reason for Consult: "SCHITZOPHRENIA/PARANOIA" Referring Physician:  Dr. Hughie Clossavi Pahwani Location of Patient: [facility] Location of Provider: [facility]  Patient Identification: Amy Casey MRN:  [id number] Principal Diagnosis: Schizophrenia [facility](HCC) Diagnosis:  Active Problems:   Dehydration   Essential hypertension   Schizophrenia (HCC)   Hypokalemia   Severe depression (HCC)   Total Time spent with patient: 1 hour  Subjective:   Amy Casey is a 58 y.o. Casey patient admitted with dehydration.  HPI:   Per chart review, patient was admitted with dehydration. She has been severely depressed with paranoia. Her husband reports that work is a stressor. She works at [employer]. She has been feeling depressed since Saturday and stopped being interested in eating and drinking since Sunday. She has been paranoid. Home medications include Prozac 40 mg BID.   Of note, patient was [facility]5/25 for depression with psychosis. There was a plan to start ECT but family later declined. Discharge medications included Prozac 60 mg daily and Zyprexa 20 mg qhs.   Amy Casey is unable to participate in interview. She does not respond to questions and is nonsensical. She frequently states "Um um" to questions. She appears to be responding to internal stimuli. She is observed whispering to herself and looking around the room.    Past Psychiatric History: Schizophrenia and depression.  Risk to Self:  Yes given active psychosis and inability to care for self.  Risk to Others:  Patient has not been agitated or demonstrated aggressive behavior. Prior Inpatient Therapy:  She was [facility] for depression with psychosis.  Prior Out[facility]:  [facility]  Past Medical History:  Past Medical History:  Diagnosis Date  . Hypertension   . Nephrolithiasis   . Schizophrenia [facility](HCC)     Past Surgical History:  Procedure  Laterality Date  . WISDOM TOOTH EXTRACTION     Family History:  Family History  Problem Relation Age of Onset  . Diabetes Mother   . Heart disease Mother   . Chronic Renal Failure Mother   . Cancer Maternal Uncle    Family Psychiatric  History: Mother-mental health problems. Social History:  Social History   Substance and Sexual Activity  Alcohol Use None     Social History   Substance and Sexual Activity  Drug Use Not on file    Social History   Socioeconomic History  . Marital status: Married    Spouse name: Not on file  . Number of children: 2  . Years of education: 10412  . Highest education level: Not on file  Occupational History  . Not on file  Social Needs  . Financial resource strain: Not on file  . Food insecurity    Worry: Not on file    Inability: Not on file  . Transportation needs    Medical: Not on file    Non-medical: Not on file  Tobacco Use  . Smoking status: Not on file  Substance and Sexual Activity  . Alcohol use: Not on file  . Drug use: Not on file  . Sexual activity: Not on file  Lifestyle  . Physical activity    Days per week: Not on file    Minutes per session: Not on file  . Stress: Not on file  Relationships  . Social Musicianconnections    Talks on phone: Not on file    Gets together: Not on file    Attends religious service: Not  on file    Active member of club or organization: Not on file    Attends meetings of clubs or organizations: Not on file    Relationship status: Not on file  Other Topics Concern  . Not on file  Social History Narrative  . Not on file   Additional Social History: She lives at home with her husband. She has an adult son.     Allergies:  Not on File  Labs:  Results for orders placed or performed during the hospital encounter of 11/06/18 (from the past 48 hour(s))  SARS CORONAVIRUS 2 Nasal Swab Aptima Multi Swab     Status: None   Collection Time: 11/06/18 12:27 PM   Specimen: Aptima Multi Swab; Nasal  Swab  Result Value Ref Range   SARS Coronavirus 2 NEGATIVE NEGATIVE    Comment: (NOTE) SARS-CoV-2 target nucleic acids are NOT DETECTED. The SARS-CoV-2 RNA is generally detectable in upper and lower respiratory specimens during the acute phase of infection. Negative results do not preclude SARS-CoV-2 infection, do not rule out co-infections with other pathogens, and should not be used as the sole basis for treatment or other patient management decisions. Negative results must be combined with clinical observations, patient history, and epidemiological information. The expected result is Negative. Fact Sheet for Patients: HairSlick.nohttps://www.fda.gov/media/138098/download Fact Sheet for Healthcare Providers: quierodirigir.comhttps://www.fda.gov/media/138095/download This test is not yet approved or cleared by the [place] FDA and  has been authorized for detection and/or diagnosis of SARS-CoV-2 by FDA under an Emergency Use Authorization (EUA). This EUA will remain  in effect (meaning this test can be used) for the duration of the COVID-19 declaration under Section 56 4(b)(1) of the Act, 21 U.S.C. section 360bbb-3(b)(1), unless the authorization is terminated or revoked sooner. Performed at [facility], [address] [place], [place] [address]   Basic metabolic panel     Status: Abnormal   Collection Time: 11/06/18 12:47 PM  Result Value Ref Range   Sodium 144 135 - 145 mmol/L   Potassium 3.1 (L) 3.5 - 5.1 mmol/L   Chloride 107 98 - 111 mmol/L   CO2 24 22 - 32 mmol/L   Glucose, Bld 106 (H) 70 - 99 mg/dL   BUN 32 (H) 6 - 20 mg/dL   Creatinine, Ser 6.041.13 (H) 0.44 - 1.00 mg/dL   Calcium 9.1 8.9 - 54.010.3 mg/dL   GFR calc non Af Amer 53 (L) >60 mL/min   GFR calc Af Amer >60 >60 mL/min   Anion gap 13 5 - 15    Comment: Performed at [facility], [address] [place], [place] [address]  HIV antibody (Routine Testing)     Status: None   Collection Time: 11/06/18 12:53 PM  Result Value  Ref Range   HIV Screen 4th Generation wRfx Non Reactive Non Reactive    Comment: (NOTE) Performed At: [facility] [address] [place], [place] [id number] Jolene SchimkeNagendra Sanjai MD ZH:[phone]   CBC     Status: None   Collection Time: 11/06/18 12:53 PM  Result Value Ref Range   WBC 8.3 4.0 - 10.5 K/uL   RBC 4.65 3.87 - 5.11 MIL/uL   Hemoglobin 13.8 12.0 - 15.0 g/dL   HCT 29.541.9 28.436.0 - 13.246.0 %   MCV 90.1 80.0 - 100.0 fL   MCH 29.7 26.0 - 34.0 pg   MCHC 32.9 30.0 - 36.0 g/dL   RDW 44.012.9 10.211.5 - 72.515.5 %   Platelets 383 150 - 400  K/uL   nRBC 0.0 0.0 - 0.2 %    Comment: Performed at [facility], [address] [place], [place] [address]  Creatinine, serum     Status: None   Collection Time: 11/06/18 12:53 PM  Result Value Ref Range   Creatinine, Ser 0.93 0.44 - 1.00 mg/dL   GFR calc non Af Amer >60 >60 mL/min   GFR calc Af Amer >60 >60 mL/min    Comment: Performed at [facility], [address] [place], [place] [address]  Basic metabolic panel     Status: Abnormal   Collection Time: 11/07/18  5:30 AM  Result Value Ref Range   Sodium 142 135 - 145 mmol/L   Potassium 2.9 (L) 3.5 - 5.1 mmol/L   Chloride 103 98 - 111 mmol/L   CO2 28 22 - 32 mmol/L   Glucose, Bld 129 (H) 70 - 99 mg/dL   BUN 19 6 - 20 mg/dL   Creatinine, Ser 0.980.85 0.44 - 1.00 mg/dL   Calcium 9.0 8.9 - 11.910.3 mg/dL   GFR calc non Af Amer >60 >60 mL/min   GFR calc Af Amer >60 >60 mL/min   Anion gap 11 5 - 15    Comment: Performed at [facility], [address] [place], [place] [address]  Magnesium     Status: None   Collection Time: 11/07/18  3:28 PM  Result Value Ref Range   Magnesium 1.9 1.7 - 2.4 mg/dL    Comment: Performed at [facility], [address] [place], [place] [address]  Basic metabolic panel     Status: Abnormal   Collection Time: 11/08/18  8:31 AM  Result Value Ref Range   Sodium 140 135 - 145 mmol/L   Potassium 4.0 3.5 - 5.1 mmol/L    Comment: NO VISIBLE HEMOLYSIS    Chloride 106 98 - 111 mmol/L   CO2 23 22 - 32 mmol/L   Glucose, Bld 111 (H) 70 - 99 mg/dL   BUN 7 6 - 20 mg/dL   Creatinine, Ser 3.080.76 0.44 - 1.00 mg/dL   Calcium 9.0 8.9 - 65.710.3 mg/dL   GFR calc non Af Amer >60 >60 mL/min   GFR calc Af Amer >60 >60 mL/min   Anion gap 11 5 - 15    Comment: Performed at [facility], [address] [place], [place] [address]  CBC with Differential/Platelet     Status: Abnormal   Collection Time: 11/08/18  8:31 AM  Result Value Ref Range   WBC 7.5 4.0 - 10.5 K/uL   RBC 4.82 3.87 - 5.11 MIL/uL   Hemoglobin 14.5 12.0 - 15.0 g/dL   HCT 29.544.2 28.436.0 - 13.246.0 %   MCV 91.7 80.0 - 100.0 fL   MCH 30.1 26.0 - 34.0 pg   MCHC 32.8 30.0 - 36.0 g/dL   RDW 44.012.8 10.211.5 - 72.515.5 %   Platelets 423 (H) 150 - 400 K/uL   nRBC 0.0 0.0 - 0.2 %   Neutrophils Relative % 67 %   Neutro Abs 5.0 1.7 - 7.7 K/uL   Lymphocytes Relative 23 %   Lymphs Abs 1.7 0.7 - 4.0 K/uL   Monocytes Relative 7 %   Monocytes Absolute 0.5 0.1 - 1.0 K/uL   Eosinophils Relative 3 %   Eosinophils Absolute 0.2 0.0 - 0.5 K/uL   Basophils Relative 0 %   Basophils Absolute 0.0 0.0 - 0.1 K/uL   Immature Granulocytes 0 %  Abs Immature Granulocytes 0.03 0.00 - 0.07 K/uL    Comment: Performed at [facility], [place] [address] [place], [place] [address]  Magnesium     Status: None   Collection Time: 11/08/18  8:31 AM  Result Value Ref Range   Magnesium 1.7 1.7 - 2.4 mg/dL    Comment: Performed at [place] [address] [place], [place] [address]    Medications:  Current Facility-Administered Medications  Medication Dose Route Frequency Provider Last Rate Last Dose  . acetaminophen (TYLENOL) tablet 650 mg  650 mg Oral Q6H PRN Norins, Heinz Knuckles, MD       Or  . acetaminophen (TYLENOL) suppository 650 mg  650 mg Rectal Q6H PRN Norins, Heinz Knuckles, MD      . amLODipine (NORVASC) tablet 5 mg  5 mg Oral Daily Neva Seat, MD   5 mg at 11/08/18 0951  . dextrose 5 % and 0.9 % NaCl  with KCl 40 mEq/L infusion   Intravenous Continuous Darliss Cheney, MD 125 mL/hr at 11/08/18 0534    . FLUoxetine (PROZAC) capsule 40 mg  40 mg Oral BID Neva Seat, MD   40 mg at 11/08/18 0951  . hydrALAZINE (APRESOLINE) injection 10 mg  10 mg Intravenous Q6H PRN Darliss Cheney, MD      . senna (SENOKOT) tablet 8.6 mg  1 tablet Oral BID Norins, Heinz Knuckles, MD   8.6 mg at 11/08/18 6387    Musculoskeletal: Strength & Muscle Tone: No atrophy noted. Gait & Station: normal Patient leans: N/A  Psychiatric Specialty Exam: Physical Exam  Nursing note and vitals reviewed. Constitutional: She appears well-developed and well-nourished.  HENT:  Head: Normocephalic and atraumatic.  Neck: Normal range of motion.  Respiratory: Effort normal.  Musculoskeletal: Normal range of motion.  Neurological: She is alert.  Oriented to self.  Psychiatric: Her affect is blunt. Her speech is slurred. She is slowed. Cognition and memory are impaired. She expresses inappropriate judgment.  Patient is nonsensical in thought process.    Review of Systems  Unable to perform ROS: Mental status change    Blood pressure (!) 153/76, pulse 68, temperature (!) 97.3 F (36.3 C), temperature source Oral, resp. rate 16, height 5\' 10"  (1.778 m), weight 77.1 kg, SpO2 100 %.Body mass index is 24.39 kg/m.  General Appearance: Fairly Groomed, African American Casey, wearing a hospital gown with dry, cracked lips who is sitting in bed. NAD.   Eye Contact:  Fair  Speech:  Slow and Slurred  Volume:  Decreased  Mood:  Dysphoric  Affect:  Flat  Thought Process:  Disorganized and Descriptions of Associations: Tangential  Orientation:  Other:  Oriented to self.  Thought Content:  Illogical  Suicidal Thoughts:  UTA due to AMS.  Homicidal Thoughts:  UTA due to AMS.  Memory:  NA  Judgement:  Impaired  Insight:  Lacking  Psychomotor Activity:  Decreased  Concentration:  Concentration: NA and Attention Span: NA  Recall:  NA   Fund of Knowledge:  NA  Language:  NA  Akathisia:  NA  Handed:  Right  AIMS (if indicated):   N/A  Assets:  Financial Resources/Insurance Housing Resilience Social Support  ADL's:  Impaired due to AMS.  Cognition:  Impaired due to AMS.  Sleep:   N/A   Assessment:  Amy Casey is a 58 y.o. Casey who was admitted with dehydration secondary to poor PO intake. Patient is well known to the psychiatry service. She exhibits psychotic symptoms  likely secondary to depression in the setting of psychosocial stressors. She has not been eating or drinking. Recommend restarting Zyprexa for psychosis. Patient warrants inpatient psychiatric hospitalization for stabilization and treatment.   Treatment Plan Summary: -Patient warrants inpatient psychiatric hospitalization given active psychosis. -Continue Software engineer.  -Continue home medications: Prozac 40 mg BID for depression. -Start Zyprexa 10 mg qhs for psychosis. Give a one time dose of 5 mg. -EKG reviewed and QTc 488 on 8/11. Please closely monitor when starting or increasing QTc prolonging agents.  -Patient will need to be IVC'd since she is unable to consent to voluntary admission due to altered mental status at this time.  -Will sign off on patient at this time. Please consult psychiatry again as needed.     Disposition: Recommend psychiatric Inpatient admission when medically cleared.  This service was provided via telemedicine using a 2-way, interactive audio and video technology.  Names of all persons participating in this telemedicine service and their role in this encounter. Name: Juanetta Beets, DO Role: Psychiatrist   Name: Amy Casey Role: Patient    Cherly Beach, DO 11/08/2018 10:38 AM

## 2018-11-09 DIAGNOSIS — F23 Brief psychotic disorder: Secondary | ICD-10-CM

## 2018-11-09 LAB — BASIC METABOLIC PANEL
Anion gap: 12 (ref 5–15)
BUN: 5 mg/dL — ABNORMAL LOW (ref 6–20)
CO2: 23 mmol/L (ref 22–32)
Calcium: 9.2 mg/dL (ref 8.9–10.3)
Chloride: 106 mmol/L (ref 98–111)
Creatinine, Ser: 0.83 mg/dL (ref 0.44–1.00)
GFR calc Af Amer: >60 mL/min (ref >60)
GFR calc non Af Amer: >60 mL/min (ref >60)
Glucose, Bld: 108 mg/dL — ABNORMAL HIGH (ref 70–99)
Potassium: 4.3 mmol/L (ref 3.5–5.1)
Sodium: 141 mmol/L (ref 135–145)

## 2018-11-09 MED ORDER — AMLODIPINE BESYLATE 10 MG PO TABS
10.0000 mg | ORAL_TABLET | Freq: Every day | ORAL | Status: DC
  Administered 2018-11-10 – 2018-11-13 (×3): 10 mg via ORAL
  Filled 2018-11-09 (×6): qty 1

## 2018-11-09 NOTE — Progress Notes (Signed)
PROGRESS NOTE    Amy QualeBeverly Helt  ZOX:[id number] DOB: 05/09/1960 DOA: 11/06/2018 PCP: Patient, No Pcp Per   Brief Narrative:  Amy Casey is a 58 y.o. female with medical history significant of schizophrenia with depression, hypertension and previous episodes of anorexia induced dehydration was brought into the ER on 11/06/2018 by her husband due to her developing severe depression with paranoia leading to anorexia and dehydration.  Evaluation in the emergency department revealed that she was hemodynamically stable however she had a KI as well as dehydration and hypokalemia.  She was admitted to hospital service.  Received IV fluids and some potassium replacement.  She had all the signs of severe depression.  Psychiatry was consulted on 11/07/2018 and they recommended inpatient psychiatry hospitalization.  Waiting for bed.  Assessment & Plan:   Principal Problem:   Acute psychosis (HCC) Active Problems:   Dehydration   Essential hypertension   Hypokalemia   Severe depression (HCC)   History of schizophrenia and depression with acute on chronic severe depression/acute psychosis: Patient currently seems to be significantly depressed and is monotonous with flat affect.  She can barely talk and is stiff.  She is oriented only x2.  consulted psychiatry on 11/07/2018, pending evaluation.  We will continue current psychiatric medications in the meantime.  Started on Zyprexa per psych recommendations.  We are waiting for psychiatric bed availability.   Acute kidney injury: Resolved. we will restart her on IV fluids once again due to my concern that she is not going to eat or drink.  Hypokalemia: Resolved.  Hypertension: Blood pressure slightly elevated.  Will increase amlodipine to 10 mg.  Continue PRN hydralazine.  DVT prophylaxis: Lovenox Code Status: Full code Family Communication: Spoke to patient's son [name] on 11/09/2018. Disposition Plan: Waiting for bed availability at  psychiatry unit.  Consultants:   Psychiatry  Procedures:   None  Antimicrobials:   None   Subjective: Patient seen and examined.  She once again remains monotonous.  No complaints.  Partially oriented.  Objective: Vitals:   11/08/18 1656 11/08/18 2100 11/09/18 0618 11/09/18 0933  BP: (!) 146/98 (!) 163/84 (!) 165/85 (!) 157/95  Pulse: 81 70 75 69  Resp: 17 16 16    Temp: 98.6 F (37 C) 98.5 F (36.9 C) 98.5 F (36.9 C)   TempSrc: Oral Oral Oral   SpO2: 100% 100% 100%   Weight:      Height:        Intake/Output Summary (Last 24 hours) at 11/09/2018 1025 Last data filed at 11/09/2018 0940 Gross per 24 hour  Intake 2312.57 ml  Output -  Net 2312.57 ml   Filed Weights   11/06/18 0954 11/06/18 1901  Weight: 74.8 kg 77.1 kg    Examination:  General exam: Appears calm and comfortable  Respiratory system: Clear to auscultation. Respiratory effort normal. Cardiovascular system: S1 & S2 heard, RRR. No JVD, murmurs, rubs, gallops or clicks. No pedal edema. Gastrointestinal system: Abdomen is nondistended, soft and nontender. No organomegaly or masses felt. Normal bowel sounds heard. Central nervous system: Alert and oriented. No focal neurological deficits. Extremities: Symmetric 5 x 5 power. Skin: No rashes, lesions or ulcers Psychiatry: Monotonous, judgement and insight appear poor. Mood & affect flat.   Data Reviewed: I have personally reviewed following labs and imaging studies  CBC: Recent Labs  Lab 11/06/18 1007 11/06/18 1253 11/08/18 0831  WBC 8.2 8.3 7.5  NEUTROABS 5.5  --  5.0  HGB 13.7 13.8 14.5  HCT 42.9 41.9  44.2  MCV 93.1 90.1 91.7  PLT 332 383 423*   Basic Metabolic Panel: Recent Labs  Lab 11/06/18 1007 11/06/18 1247 11/06/18 1253 11/07/18 0530 11/07/18 1528 11/08/18 0831  NA 144 144  --  142  --  140  K 2.7* 3.1*  --  2.9*  --  4.0  CL 103 107  --  103  --  106  CO2 26 24  --  28  --  23  GLUCOSE 115* 106*  --  129*  --  111*  BUN  38* 32*  --  19  --  7  CREATININE 1.23* 1.13* 0.93 0.85  --  0.76  CALCIUM 9.4 9.1  --  9.0  --  9.0  MG 2.1  --   --   --  1.9 1.7   GFR: Estimated Creatinine Clearance: 81.9 mL/min (by C-G formula based on SCr of 0.76 mg/dL). Liver Function Tests: No results for input(s): AST, ALT, ALKPHOS, BILITOT, PROT, ALBUMIN in the last 168 hours. No results for input(s): LIPASE, AMYLASE in the last 168 hours. No results for input(s): AMMONIA in the last 168 hours. Coagulation Profile: No results for input(s): INR, PROTIME in the last 168 hours. Cardiac Enzymes: No results for input(s): CKTOTAL, CKMB, CKMBINDEX, TROPONINI in the last 168 hours. BNP (last 3 results) No results for input(s): PROBNP in the last 8760 hours. HbA1C: No results for input(s): HGBA1C in the last 72 hours. CBG: No results for input(s): GLUCAP in the last 168 hours. Lipid Profile: No results for input(s): CHOL, HDL, LDLCALC, TRIG, CHOLHDL, LDLDIRECT in the last 72 hours. Thyroid Function Tests: No results for input(s): TSH, T4TOTAL, FREET4, T3FREE, THYROIDAB in the last 72 hours. Anemia Panel: No results for input(s): VITAMINB12, FOLATE, FERRITIN, TIBC, IRON, RETICCTPCT in the last 72 hours. Sepsis Labs: No results for input(s): PROCALCITON, LATICACIDVEN in the last 168 hours.  Recent Results (from the past 240 hour(s))  SARS CORONAVIRUS 2 Nasal Swab Aptima Multi Swab     Status: None   Collection Time: 11/06/18 12:27 PM   Specimen: Aptima Multi Swab; Nasal Swab  Result Value Ref Range Status   SARS Coronavirus 2 NEGATIVE NEGATIVE Final    Comment: (NOTE) SARS-CoV-2 target nucleic acids are NOT DETECTED. The SARS-CoV-2 RNA is generally detectable in upper and lower respiratory specimens during the acute phase of infection. Negative results do not preclude SARS-CoV-2 infection, do not rule out co-infections with other pathogens, and should not be used as the sole basis for treatment or other patient management  decisions. Negative results must be combined with clinical observations, patient history, and epidemiological information. The expected result is Negative. Fact Sheet for Patients: HairSlick.nohttps://www.fda.gov/media/138098/download Fact Sheet for Healthcare Providers: quierodirigir.comhttps://www.fda.gov/media/138095/download This test is not yet approved or cleared by the [place] FDA and  has been authorized for detection and/or diagnosis of SARS-CoV-2 by FDA under an Emergency Use Authorization (EUA). This EUA will remain  in effect (meaning this test can be used) for the duration of the COVID-19 declaration under Section 56 4(b)(1) of the Act, 21 U.S.C. section 360bbb-3(b)(1), unless the authorization is terminated or revoked sooner. Performed at [facility], [address] [place], [place] [address]       Radiology Studies: No results found.  Scheduled Meds: . amLODipine  10 mg Oral Daily  . FLUoxetine  40 mg Oral BID  . OLANZapine  10 mg Oral QHS  . senna  1 tablet Oral BID   Continuous  Infusions: . dextrose 5 % and 0.9 % NaCl with KCl 40 mEq/L 125 mL/hr at 11/09/18 0935     LOS: 1 day   Time spent: 25 minutes   Darliss Cheney, MD [facility] Pager [phone]  If 7PM-7AM, please contact night-coverage [email or web address] Password [facility] 11/09/2018, 10:25 AM

## 2018-11-09 NOTE — Progress Notes (Signed)
Assumed care of patient at 1530. Agree with assessment by prior nurse. Safety sitter at bedside. Will continue to monitor.  Hiram Comber, RN 11/09/2018 4:20 PM

## 2018-11-10 NOTE — Progress Notes (Signed)
   11/10/18 1231  Vitals  BP (!) 173/97  MAP (mmHg) 119   Patient's blood pressure elevated. Administered scheduled Amlodipine (initially refused) and Prozac crushed in applesauce and given after extensive convincing. Will re-assess in an hour and administer PRN Hydralazine if needed.   Hiram Comber, RN 11/10/2018 12:48 PM

## 2018-11-10 NOTE — Progress Notes (Signed)
Patient refusing all PO medications this morning. Patient's breakfast in front of patient, but patient keeps repeating "I want to lay down." Will continue to encourage PO intake.   Hiram Comber, RN 11/10/2018 8:23 AM

## 2018-11-10 NOTE — Progress Notes (Signed)
   11/10/18 1313 11/10/18 1351  Vitals  BP (!) 175/96 (!) 156/93  MAP (mmHg) 121 110    Blood pressure following amlodipine was 175/96. PRN Hydralazine administered; repeat BP 156/93. Will continue to monitor.

## 2018-11-10 NOTE — Progress Notes (Signed)
PROGRESS NOTE  Amy QualeBeverly Casey WUJ:[id number] DOB: 01/03/1961 DOA: 11/06/2018 PCP: Patient, No Pcp Per    Brief Narrative:  Amy BearsBeverly Pickardis a 58 y.o.femalewith medical history significant ofschizophrenia with depression, hypertension and previous episodes of anorexia induced dehydration was brought into the ER on 11/06/2018 by her husband due to her developing severe depression with paranoia leading to anorexia and dehydration.  Evaluation in the emergency department revealed that she was hemodynamically stable however she had a KI as well as dehydration and hypokalemia.  She was admitted to hospital service.  Received IV fluids and some potassium replacement.  She had all the signs of severe depression.  Psychiatry was consulted on 11/07/2018 and they recommended inpatient psychiatry hospitalization.  Waiting for bed.  HPI/Recap of past 24 hours:  She is sitting up in chair, flat affect, answers question with simple yes or no, does not elaborate She denies pain, she refused being examed Sitter at bedside  Awaiting psych bed  Assessment/Plan: Principal Problem:   Acute psychosis (HCC) Active Problems:   Dehydration   Essential hypertension   Hypokalemia   Severe depression (HCC)  Poor oral intake, she is currently on hydration, no labs this am, am lab ordered  History of schizophrenia and depression with acute on chronic severe depression/acute psychosis: Patient currently seems to be significantly depressed and is monotonous with flat affect.  She can barely talk and is stiff.  She is oriented only x2.  consulted psychiatry on 11/07/2018, pending evaluation.  We will continue current psychiatric medications in the meantime.  Started on Zyprexa per psych recommendations.  We are waiting for psychiatric bed availability.   Acute kidney injury: Resolved. we will restart her on IV fluids once again due to my concern that she is not going to eat or drink.  Hypokalemia: Resolved.   Hypertension: Blood pressure slightly elevated.  Will increase amlodipine to 10 mg.  Continue PRN hydralazine.  DVT prophylaxis: Lovenox Code Status: Full code Family Communication: Spoke to patient's son [name] on 11/09/2018. Disposition Plan: Waiting for bed availability at psychiatry unit.  Consultants:   Psychiatry  Procedures:   None  Antimicrobials:   None   Objective: BP 135/75 (BP Location: Right Arm)   Pulse 82   Temp 97.9 F (36.6 C) (Oral)   Resp 19   Ht 5\' 10"  (1.778 m)   Wt 77.1 kg   SpO2 100%   BMI 24.39 kg/m   Intake/Output Summary (Last 24 hours) at 11/10/2018 1044 Last data filed at 11/10/2018 0930 Gross per 24 hour  Intake 3164.03 ml  Output -  Net 3164.03 ml   Filed Weights   11/06/18 0954 11/06/18 1901  Weight: 74.8 kg 77.1 kg    Exam: Patient is examined daily including today on 11/10/2018, exams remain the same as of yesterday except that has changed    General:  NAD, thin  Musculoskeletal: No Edema  Neuro: alert, oriented , flat affect   Data Reviewed: Basic Metabolic Panel: Recent Labs  Lab 11/06/18 1007 11/06/18 1247 11/06/18 1253 11/07/18 0530 11/07/18 1528 11/08/18 0831 11/09/18 0945  NA 144 144  --  142  --  140 141  K 2.7* 3.1*  --  2.9*  --  4.0 4.3  CL 103 107  --  103  --  106 106  CO2 26 24  --  28  --  23 23  GLUCOSE 115* 106*  --  129*  --  111* 108*  BUN 38* 32*  --  19  --  7 5*  CREATININE 1.23* 1.13* 0.93 0.85  --  0.76 0.83  CALCIUM 9.4 9.1  --  9.0  --  9.0 9.2  MG 2.1  --   --   --  1.9 1.7  --    Liver Function Tests: No results for input(s): AST, ALT, ALKPHOS, BILITOT, PROT, ALBUMIN in the last 168 hours. No results for input(s): LIPASE, AMYLASE in the last 168 hours. No results for input(s): AMMONIA in the last 168 hours. CBC: Recent Labs  Lab 11/06/18 1007 11/06/18 1253 11/08/18 0831  WBC 8.2 8.3 7.5  NEUTROABS 5.5  --  5.0  HGB 13.7 13.8 14.5  HCT 42.9 41.9 44.2  MCV 93.1 90.1  91.7  PLT 332 383 423*   Cardiac Enzymes:   No results for input(s): CKTOTAL, CKMB, CKMBINDEX, TROPONINI in the last 168 hours. BNP (last 3 results) No results for input(s): BNP in the last 8760 hours.  ProBNP (last 3 results) No results for input(s): PROBNP in the last 8760 hours.  CBG: No results for input(s): GLUCAP in the last 168 hours.  Recent Results (from the past 240 hour(s))  SARS CORONAVIRUS 2 Nasal Swab Aptima Multi Swab     Status: None   Collection Time: 11/06/18 12:27 PM   Specimen: Aptima Multi Swab; Nasal Swab  Result Value Ref Range Status   SARS Coronavirus 2 NEGATIVE NEGATIVE Final    Comment: (NOTE) SARS-CoV-2 target nucleic acids are NOT DETECTED. The SARS-CoV-2 RNA is generally detectable in upper and lower respiratory specimens during the acute phase of infection. Negative results do not preclude SARS-CoV-2 infection, do not rule out co-infections with other pathogens, and should not be used as the sole basis for treatment or other patient management decisions. Negative results must be combined with clinical observations, patient history, and epidemiological information. The expected result is Negative. Fact Sheet for Patients: SugarRoll.be Fact Sheet for Healthcare Providers: https://www.woods-mathews.com/ This test is not yet approved or cleared by the [place] FDA and  has been authorized for detection and/or diagnosis of SARS-CoV-2 by FDA under an Emergency Use Authorization (EUA). This EUA will remain  in effect (meaning this test can be used) for the duration of the COVID-19 declaration under Section 56 4(b)(1) of the Act, 21 U.S.C. section 360bbb-3(b)(1), unless the authorization is terminated or revoked sooner. Performed at [facility], [place] [address], [place], [place] [address]      Studies: No results found.  Scheduled Meds: . amLODipine  10 mg Oral Daily  . FLUoxetine  40 mg Oral  BID  . OLANZapine  10 mg Oral QHS  . senna  1 tablet Oral BID    Continuous Infusions: . dextrose 5 % and 0.9 % NaCl with KCl 40 mEq/L 125 mL/hr at 11/10/18 0058     Time spent: 56mins I have personally reviewed and interpreted on  11/10/2018 daily labs, imagings as discussed above under date review session and assessment and plans.  I reviewed all nursing notes, pharmacy notes, consultant notes,  vitals, pertinent old records  I have discussed plan of care as described above with RN , patient  on 11/10/2018   Florencia Reasons MD, PhD, FACP  [facility] Pager [phone]. If 7PM-7AM, please contact night-coverage at [email or web address], password [facility] 11/10/2018, 10:44 AM  LOS: 2 days

## 2018-11-10 NOTE — Progress Notes (Signed)
Patient remains extremely withdrawn and monotoned throughout the shift. Hardly speaks with staff. Starts at the walls majority of the time. Has no appetite; zero bites of food consumed this shift. Will continue to encourage PO intake and monitor patient.   Hiram Comber, RN 11/10/2018 4:32 PM

## 2018-11-11 DIAGNOSIS — N179 Acute kidney failure, unspecified: Secondary | ICD-10-CM

## 2018-11-11 NOTE — Progress Notes (Signed)
PROGRESS NOTE  Harvel QualeBeverly Dorman ZOX:[id number] DOB: 11/15/1960 DOA: 11/06/2018 PCP: Patient, No Pcp Per    Brief Narrative:  Amy BearsBeverly Pickardis a 58 y.o.femalewith medical history significant ofschizophrenia with depression, hypertension and previous episodes of anorexia induced dehydration was brought into the ER on 11/06/2018 by her husband due to her developing severe depression with paranoia leading to anorexia and dehydration.  Evaluation in the emergency department revealed that she was hemodynamically stable however she had AKI as well as dehydration and hypokalemia.  She was admitted to hospital service.  Received IV fluids and some potassium replacement.  She had all the signs of severe depression.  Psychiatry was consulted on 11/07/2018 and they recommended inpatient psychiatry hospitalization.  Waiting for bed.  HPI/Recap of past 24 hours:  Sitting on the side of the bed Safety sitter in the room She has not been eating her meals Refused labs this morning Does not really engage in conversation  Awaiting psych bed  Assessment/Plan: Principal Problem:   Acute psychosis (HCC) Active Problems:   Dehydration   Essential hypertension   Hypokalemia   Severe depression (HCC)  History of schizophrenia and depression with acute on chronic severe depression/acute psychosis: Patient currently seems to be significantly depressed and is monotonous with flat affect.  She can barely talk and is stiff.  She is oriented only x2.  consulted psychiatry on 11/07/2018, pending evaluation.  We will continue current psychiatric medications in the meantime.  Started on Zyprexa per psych recommendations.  We are waiting for psychiatric bed availability.   Acute kidney injury: Resolved with hydration   Hypokalemia: Resolved.  Hypertension: currently on amlodipine. Blood pressure remains elevated. She has been refusing medications. Continue PRN hydralazine. Continue to monitor.  DVT  prophylaxis: Lovenox Code Status: Full code Family Communication: Spoke to patient's son [name] on 11/09/2018. Disposition Plan: Waiting for bed availability at psychiatry unit.  Consultants:   Psychiatry  Procedures:   None  Antimicrobials:   None   Objective: BP (!) 165/99 (BP Location: Left Arm)   Pulse 92   Temp 98.3 F (36.8 C) (Oral)   Resp 17   Ht 5\' 10"  (1.778 m)   Wt 77.1 kg   SpO2 100%   BMI 24.39 kg/m   Intake/Output Summary (Last 24 hours) at 11/11/2018 1531 Last data filed at 11/11/2018 1519 Gross per 24 hour  Intake 2265.63 ml  Output -  Net 2265.63 ml   Filed Weights   11/06/18 0954 11/06/18 1901  Weight: 74.8 kg 77.1 kg    Exam: General exam: Alert, awake, no distress Respiratory system: Clear to auscultation. Respiratory effort normal. Cardiovascular system:RRR. No murmurs, rubs, gallops. Gastrointestinal system: Abdomen is nondistended, soft and nontender. No organomegaly or masses felt. Normal bowel sounds heard. Central nervous system: Alert and oriented. No focal neurological deficits. Extremities: No C/C/E, +pedal pulses Skin: No rashes, lesions or ulcers Psychiatry: withdrawn, hypophonic, flat affect   Data Reviewed: Basic Metabolic Panel: Recent Labs  Lab 11/06/18 1007 11/06/18 1247 11/06/18 1253 11/07/18 0530 11/07/18 1528 11/08/18 0831 11/09/18 0945  NA 144 144  --  142  --  140 141  K 2.7* 3.1*  --  2.9*  --  4.0 4.3  CL 103 107  --  103  --  106 106  CO2 26 24  --  28  --  23 23  GLUCOSE 115* 106*  --  129*  --  111* 108*  BUN 38* 32*  --  19  --  7 5*  CREATININE 1.23* 1.13* 0.93 0.85  --  0.76 0.83  CALCIUM 9.4 9.1  --  9.0  --  9.0 9.2  MG 2.1  --   --   --  1.9 1.7  --    Liver Function Tests: No results for input(s): AST, ALT, ALKPHOS, BILITOT, PROT, ALBUMIN in the last 168 hours. No results for input(s): LIPASE, AMYLASE in the last 168 hours. No results for input(s): AMMONIA in the last 168 hours. CBC:  Recent Labs  Lab 11/06/18 1007 11/06/18 1253 11/08/18 0831  WBC 8.2 8.3 7.5  NEUTROABS 5.5  --  5.0  HGB 13.7 13.8 14.5  HCT 42.9 41.9 44.2  MCV 93.1 90.1 91.7  PLT 332 383 423*   Cardiac Enzymes:   No results for input(s): CKTOTAL, CKMB, CKMBINDEX, TROPONINI in the last 168 hours. BNP (last 3 results) No results for input(s): BNP in the last 8760 hours.  ProBNP (last 3 results) No results for input(s): PROBNP in the last 8760 hours.  CBG: No results for input(s): GLUCAP in the last 168 hours.  Recent Results (from the past 240 hour(s))  SARS CORONAVIRUS 2 Nasal Swab Aptima Multi Swab     Status: None   Collection Time: 11/06/18 12:27 PM   Specimen: Aptima Multi Swab; Nasal Swab  Result Value Ref Range Status   SARS Coronavirus 2 NEGATIVE NEGATIVE Final    Comment: (NOTE) SARS-CoV-2 target nucleic acids are NOT DETECTED. The SARS-CoV-2 RNA is generally detectable in upper and lower respiratory specimens during the acute phase of infection. Negative results do not preclude SARS-CoV-2 infection, do not rule out co-infections with other pathogens, and should not be used as the sole basis for treatment or other patient management decisions. Negative results must be combined with clinical observations, patient history, and epidemiological information. The expected result is Negative. Fact Sheet for Patients: SugarRoll.be Fact Sheet for Healthcare Providers: https://www.woods-mathews.com/ This test is not yet approved or cleared by the [place] FDA and  has been authorized for detection and/or diagnosis of SARS-CoV-2 by FDA under an Emergency Use Authorization (EUA). This EUA will remain  in effect (meaning this test can be used) for the duration of the COVID-19 declaration under Section 56 4(b)(1) of the Act, 21 U.S.C. section 360bbb-3(b)(1), unless the authorization is terminated or revoked sooner. Performed at [facility], [place] [address], [place], [place] [address]      Studies: No results found.  Scheduled Meds: . amLODipine  10 mg Oral Daily  . FLUoxetine  40 mg Oral BID  . OLANZapine  10 mg Oral QHS  . senna  1 tablet Oral BID    Continuous Infusions: . dextrose 5 % and 0.9 % NaCl with KCl 40 mEq/L 125 mL/hr at 11/11/18 1246     Time spent: 70mins I have personally reviewed and interpreted on  11/11/2018 daily labs, imagings as discussed above under date review session and assessment and plans.  I reviewed all nursing notes, pharmacy notes, consultant notes,  vitals, pertinent old records  I have discussed plan of care as described above with RN , patient  on 11/11/2018   Kathie Dike MD  [facility]  If 7PM-7AM, please contact night-coverage at [email or web address] 11/11/2018, 3:31 PM  LOS: 3 days

## 2018-11-11 NOTE — Progress Notes (Signed)
Refused morning lab draw. Elita Boone, BSN, RN

## 2018-11-11 NOTE — Progress Notes (Signed)
Patient receiving care in [phone].  Sitter present in room.  Patient has multiple scattered small fluid filled bullae on the right forearm below the [facility].  It is unknown why these started.  The areas were covered with 2 Xeroform gauzes and secured with kerlex.  The left hand IV was secured with Kerlex as well. Val Riles, RN, MSN, CWOCN, CNS-BC, pager [phone]

## 2018-11-11 NOTE — Progress Notes (Signed)
Refused scheduled bedtime medications x 3. Elita Boone, BSN, RN

## 2018-11-11 NOTE — Plan of Care (Signed)
  Problem: Health Behavior/Discharge Planning: Goal: Ability to manage health-related needs will improve Outcome: Progressing Patient non compliant with care; refusing medications and lab work   Problem: Clinical Measurements: Goal: Cardiovascular complication will be avoided Outcome: Progressing Blood pressure reading 694'H, systolic. No required PRN during shift   Problem: Safety: Goal: Ability to remain free from injury will improve Outcome: Progressing Sitter remains at the bedside

## 2018-11-11 NOTE — Plan of Care (Signed)
Patient has very flat affect, will not take her medications. States she doesn't need them.  Problem: Education: Goal: Knowledge of General Education information will improve Description: Including pain rating scale, medication(s)/side effects and non-pharmacologic comfort measures Outcome: Not Progressing   Problem: Health Behavior/Discharge Planning: Goal: Ability to manage health-related needs will improve Outcome: Not Progressing   Problem: Clinical Measurements: Goal: Ability to maintain clinical measurements within normal limits will improve Outcome: Not Progressing Goal: Will remain free from infection Outcome: Not Progressing Goal: Diagnostic test results will improve Outcome: Not Progressing Goal: Respiratory complications will improve Outcome: Not Progressing Goal: Cardiovascular complication will be avoided Outcome: Not Progressing   Problem: Activity: Goal: Risk for activity intolerance will decrease Outcome: Not Progressing   Problem: Nutrition: Goal: Adequate nutrition will be maintained Outcome: Not Progressing   Problem: Coping: Goal: Level of anxiety will decrease Outcome: Not Progressing   Problem: Elimination: Goal: Will not experience complications related to bowel motility Outcome: Not Progressing Goal: Will not experience complications related to urinary retention Outcome: Not Progressing   Problem: Pain Managment: Goal: General experience of comfort will improve Outcome: Not Progressing   Problem: Safety: Goal: Ability to remain free from injury will improve Outcome: Not Progressing   Problem: Skin Integrity: Goal: Risk for impaired skin integrity will decrease Outcome: Not Progressing

## 2018-11-12 LAB — BASIC METABOLIC PANEL
Anion gap: 10 (ref 5–15)
BUN: 7 mg/dL (ref 6–20)
CO2: 23 mmol/L (ref 22–32)
Calcium: 9.2 mg/dL (ref 8.9–10.3)
Chloride: 107 mmol/L (ref 98–111)
Creatinine, Ser: 0.78 mg/dL (ref 0.44–1.00)
GFR calc Af Amer: >60 mL/min (ref >60)
GFR calc non Af Amer: >60 mL/min (ref >60)
Glucose, Bld: 109 mg/dL — ABNORMAL HIGH (ref 70–99)
Potassium: 4.1 mmol/L (ref 3.5–5.1)
Sodium: 140 mmol/L (ref 135–145)

## 2018-11-12 MED ORDER — AMLODIPINE BESYLATE 10 MG PO TABS: 10.0000 mg | ORAL_TABLET | Freq: Every day | ORAL | Status: DC

## 2018-11-12 MED ORDER — OLANZAPINE 10 MG PO TABS: 10.0000 mg | ORAL_TABLET | Freq: Every day | ORAL | Status: DC

## 2018-11-12 NOTE — Progress Notes (Signed)
Patient has poor appetite eating and drinking minimally.

## 2018-11-12 NOTE — Progress Notes (Signed)
CSW called Baldo Ash, [place] at [facility]. They do not have any female beds available today. Possibly on Tuesday. They did not have many discharges today as they thought they would.   CSW called and spoke with TTS nurse at [facility]. They are looking at the patient and will call back if they have a bed available today.   CSW will continue to follow and assist with discharge planning.   Domenic Schwab, MSW, [place] Worker [facility]  [phone]

## 2018-11-12 NOTE — Discharge Summary (Signed)
Physician Discharge Summary  Harvel QualeBeverly Dollinger ZOX:[id number] DOB: 12/07/1960 DOA: 11/06/2018  PCP: Patient, No Pcp Per  Admit date: 11/06/2018 Discharge date: 11/12/2018  Admitted [facility]: [facility]  Recommendations for Outpatient Follow-up:  1. Transfer to inpatient Psych   Discharge Condition: Stable CODE STATUS: Full Diet recommendation: 2g Na  Brief/Interim Summary:  58 y.o.femalewith medical history significant ofschizophrenia with depression, hypertension and previous episodes of anorexia induced dehydration was brought into the ER on 11/06/2018 by her husband due to her developing severe depression with paranoia leading to anorexia and dehydration. Evaluation in the emergency department revealed that she was hemodynamically stable however she had AKI as well as dehydration and hypokalemia. She was admitted to hospital service. Received IV fluids and some potassium replacement. She had all the signs of severe depression. Psychiatry was consulted on 8/12/2020and they recommended inpatient psychiatry hospitalization.      Discharge Diagnoses:  Principal Problem:   Acute psychosis (HCC) Active Problems:   Dehydration   Essential hypertension   Hypokalemia   Severe depression (HCC)  History of schizophrenia and depression with acute on chronic severe depression/acute psychosis:  Very depressed. Having some auditory hallucinations.  Psychiatry recommending inpatient admission.  Started on Zyprexa  Acute kidney injury: Resolved with hydration   Hypokalemia: Resolved.  Hypertension:Amlodipine increased to 10 mg daily..   Consultations:  Psychiatry  Subjective: Sitting in the bed, no complaints but she is having some auditory hallucination and keeps on mumbling and talking to herself.  Discharge Exam: Vitals:   11/12/18 0501 11/12/18 0855  BP: (!) 158/82 (!) 176/102  Pulse: 79 93  Resp: 16 18  Temp: 98 F (36.7 C) 98.6 F (37 C)  SpO2: 96%  100%   Vitals:   11/11/18 1639 11/11/18 1958 11/12/18 0501 11/12/18 0855  BP: (!) 155/91 (!) 178/87 (!) 158/82 (!) 176/102  Pulse: 81 85 79 93  Resp: 18 16 16 18   Temp: 98 F (36.7 C) 97.8 F (36.6 C) 98 F (36.7 C) 98.6 F (37 C)  TempSrc: Axillary Axillary Axillary Axillary  SpO2: 100% 98% 96% 100%  Weight:      Height:        General: Pt is alert, awake, not in acute distress Cardiovascular: RRR, S1/S2 +, no rubs, no gallops Respiratory: CTA bilaterally, no wheezing, no rhonchi Abdominal: Soft, NT, ND, bowel sounds + Extremities: no edema, no cyanosis Auditory hallucinations.  No suicidal homicidal ideations  Discharge Instructions   Allergies as of 11/12/2018   Not on File     Medication List    TAKE these medications   amLODipine 10 MG tablet Commonly known as: NORVASC Take 1 tablet (10 mg total) by mouth daily. Start taking on: November 13, 2018 What changed:   medication strength  how much to take   FLUoxetine 40 MG capsule Commonly known as: PROZAC Take 40 mg by mouth 2 (two) times daily.   OLANZapine 10 MG tablet Commonly known as: ZYPREXA Take 1 tablet (10 mg total) by mouth at bedtime.       Not on File  You were cared for by a hospitalist during your hospital stay. If you have any questions about your discharge medications or the care you received while you were in the hospital after you are discharged, you can call the unit and asked to speak with the hospitalist on call if the hospitalist that took care of you is not available. Once you are discharged, your primary care physician will handle any  further medical issues. Please note that no refills for any discharge medications will be authorized once you are discharged, as it is imperative that you return to your primary care physician (or establish a relationship with a primary care physician if you do not have one) for your aftercare needs so that they can reassess your need for medications and  monitor your lab values.   Procedures/Studies:  No results found.   The results of significant diagnostics from this hospitalization (including imaging, microbiology, ancillary and laboratory) are listed below for reference.     Microbiology: Recent Results (from the past 240 hour(s))  SARS CORONAVIRUS 2 Nasal Swab Aptima Multi Swab     Status: None   Collection Time: 11/06/18 12:27 PM   Specimen: Aptima Multi Swab; Nasal Swab  Result Value Ref Range Status   SARS Coronavirus 2 NEGATIVE NEGATIVE Final    Comment: (NOTE) SARS-CoV-2 target nucleic acids are NOT DETECTED. The SARS-CoV-2 RNA is generally detectable in upper and lower respiratory specimens during the acute phase of infection. Negative results do not preclude SARS-CoV-2 infection, do not rule out co-infections with other pathogens, and should not be used as the sole basis for treatment or other patient management decisions. Negative results must be combined with clinical observations, patient history, and epidemiological information. The expected result is Negative. Fact Sheet for Patients: SugarRoll.be Fact Sheet for Healthcare Providers: https://www.woods-mathews.com/ This test is not yet approved or cleared by the [place] FDA and  has been authorized for detection and/or diagnosis of SARS-CoV-2 by FDA under an Emergency Use Authorization (EUA). This EUA will remain  in effect (meaning this test can be used) for the duration of the COVID-19 declaration under Section 56 4(b)(1) of the Act, 21 U.S.C. section 360bbb-3(b)(1), unless the authorization is terminated or revoked sooner. Performed at [facility], [place] [address], [place], [place] [address]      Labs: BNP (last 3 results) No results for input(s): BNP in the last 8760 hours. Basic Metabolic Panel: Recent Labs  Lab 11/06/18 1007 11/06/18 1247 11/06/18 1253 11/07/18 0530 11/07/18 1528  11/08/18 0831 11/09/18 0945 11/12/18 0348  NA 144 144  --  142  --  140 141 140  K 2.7* 3.1*  --  2.9*  --  4.0 4.3 4.1  CL 103 107  --  103  --  106 106 107  CO2 26 24  --  28  --  23 23 23   GLUCOSE 115* 106*  --  129*  --  111* 108* 109*  BUN 38* 32*  --  19  --  7 5* 7  CREATININE 1.23* 1.13* 0.93 0.85  --  0.76 0.83 0.78  CALCIUM 9.4 9.1  --  9.0  --  9.0 9.2 9.2  MG 2.1  --   --   --  1.9 1.7  --   --    Liver Function Tests: No results for input(s): AST, ALT, ALKPHOS, BILITOT, PROT, ALBUMIN in the last 168 hours. No results for input(s): LIPASE, AMYLASE in the last 168 hours. No results for input(s): AMMONIA in the last 168 hours. CBC: Recent Labs  Lab 11/06/18 1007 11/06/18 1253 11/08/18 0831  WBC 8.2 8.3 7.5  NEUTROABS 5.5  --  5.0  HGB 13.7 13.8 14.5  HCT 42.9 41.9 44.2  MCV 93.1 90.1 91.7  PLT 332 383 423*   Cardiac Enzymes: No results for input(s): CKTOTAL, CKMB, CKMBINDEX, TROPONINI in the last 168 hours. BNP: Invalid  input(s): POCBNP CBG: No results for input(s): GLUCAP in the last 168 hours. D-Dimer No results for input(s): DDIMER in the last 72 hours. Hgb A1c No results for input(s): HGBA1C in the last 72 hours. Lipid Profile No results for input(s): CHOL, HDL, LDLCALC, TRIG, CHOLHDL, LDLDIRECT in the last 72 hours. Thyroid function studies No results for input(s): TSH, T4TOTAL, T3FREE, THYROIDAB in the last 72 hours.  Invalid input(s): FREET3 Anemia work up No results for input(s): VITAMINB12, FOLATE, FERRITIN, TIBC, IRON, RETICCTPCT in the last 72 hours. Urinalysis No results found for: COLORURINE, APPEARANCEUR, LABSPEC, PHURINE, GLUCOSEU, HGBUR, BILIRUBINUR, KETONESUR, PROTEINUR, UROBILINOGEN, NITRITE, LEUKOCYTESUR Sepsis Labs Invalid input(s): PROCALCITONIN,  WBC,  LACTICIDVEN Microbiology Recent Results (from the past 240 hour(s))  SARS CORONAVIRUS 2 Nasal Swab Aptima Multi Swab     Status: None   Collection Time: 11/06/18 12:27 PM    Specimen: Aptima Multi Swab; Nasal Swab  Result Value Ref Range Status   SARS Coronavirus 2 NEGATIVE NEGATIVE Final    Comment: (NOTE) SARS-CoV-2 target nucleic acids are NOT DETECTED. The SARS-CoV-2 RNA is generally detectable in upper and lower respiratory specimens during the acute phase of infection. Negative results do not preclude SARS-CoV-2 infection, do not rule out co-infections with other pathogens, and should not be used as the sole basis for treatment or other patient management decisions. Negative results must be combined with clinical observations, patient history, and epidemiological information. The expected result is Negative. Fact Sheet for Patients: HairSlick.nohttps://www.fda.gov/media/138098/download Fact Sheet for Healthcare Providers: quierodirigir.comhttps://www.fda.gov/media/138095/download This test is not yet approved or cleared by the [place] FDA and  has been authorized for detection and/or diagnosis of SARS-CoV-2 by FDA under an Emergency Use Authorization (EUA). This EUA will remain  in effect (meaning this test can be used) for the duration of the COVID-19 declaration under Section 56 4(b)(1) of the Act, 21 U.S.C. section 360bbb-3(b)(1), unless the authorization is terminated or revoked sooner. Performed at [facility], [address] [place], [place] [address]      Time coordinating discharge:  I have spent 35 minutes face to face with the patient and on the ward discussing the patients care, assessment, plan and disposition with other care givers. >50% of the time was devoted counseling the patient about the risks and benefits of treatment/Discharge disposition and coordinating care.   SIGNED:   Dimple NanasAnkit Chirag , MD  [facility] 11/12/2018, 11:33 AM   If 7PM-7AM, please contact night-coverage [email or web address]

## 2018-11-13 DIAGNOSIS — F203 Undifferentiated schizophrenia: Secondary | ICD-10-CM | POA: Diagnosis not present

## 2018-11-13 DIAGNOSIS — L231 Allergic contact dermatitis due to adhesives: Secondary | ICD-10-CM | POA: Diagnosis not present

## 2018-11-13 DIAGNOSIS — F250 Schizoaffective disorder, bipolar type: Secondary | ICD-10-CM | POA: Diagnosis not present

## 2018-11-13 DIAGNOSIS — I10 Essential (primary) hypertension: Secondary | ICD-10-CM | POA: Diagnosis not present

## 2018-11-13 DIAGNOSIS — F23 Brief psychotic disorder: Secondary | ICD-10-CM | POA: Diagnosis not present

## 2018-11-13 DIAGNOSIS — R45851 Suicidal ideations: Secondary | ICD-10-CM | POA: Diagnosis not present

## 2018-11-13 DIAGNOSIS — E860 Dehydration: Secondary | ICD-10-CM | POA: Diagnosis not present

## 2018-11-13 DIAGNOSIS — Z9114 Patient's other noncompliance with medication regimen: Secondary | ICD-10-CM | POA: Diagnosis not present

## 2018-11-13 NOTE — Progress Notes (Signed)
Patient continues to need inpatient psychiatric hospitalization. CSW faxed information to the following facilities:   [place]  [place]  [place]  [place]  [place] [place] [facility] [place]  [phone]

## 2018-11-13 NOTE — Progress Notes (Signed)
PROGRESS NOTE    Amy Casey  ZOX:[id number]:[id number] DOB: 05/31/1960 DOA: 11/06/2018 PCP: Patient, No Pcp Per   Brief Narrative: 58 y.o.femalewith medical history significant ofschizophrenia with depression, hypertension and previous episodes of anorexia induced dehydration was brought into the ER on 11/06/2018 by her husband due to her developing severe depression with paranoia leading to anorexia and dehydration. Evaluation in the emergency department revealed that she was hemodynamically stable however she hadAKI as well as dehydration and hypokalemia. She was admitted to hospital service. Received IV fluids and some potassium replacement. She had all the signs of severe depression. Psychiatry was consulted on 8/12/2020and they recommended inpatient psychiatry hospitalization.     Assessment & Plan:   Principal Problem:   Acute psychosis (HCC) Active Problems:   Dehydration   Essential hypertension   Hypokalemia   Severe depression (HCC)   History of schizophrenia and depression with acute on chronic severe depression/acute psychosis:  Some auditory elucidation.  Very poor oral intake. Psychiatry recommending inpatient admission.  Started on Zyprexa.  Would benefit from inpatient psych.  Acute kidney injury: Resolvedwith hydration  Hypokalemia: Resolved.  Hypertension:Amlodipine increased to 10 mg daily..    DVT prophylaxis: Ambulatory Code Status: Full code Family Communication: None Disposition Plan: Awaiting bed for inpatient psych  Consultants:   Psychiatry  Procedures:   None  Antimicrobials:   None   Subjective: Patient is sitting up at the side of the bed, alert but does not participate in conversation and only responds to me any yes or no.  Denies any complaints.  Review of Systems Otherwise negative except as per HPI, including: General: Denies fever, chills, night sweats or unintended weight loss. Resp: Denies cough, wheezing,  shortness of breath. Cardiac: Denies chest pain, palpitations, orthopnea, paroxysmal nocturnal dyspnea. GI: Denies abdominal pain, nausea, vomiting, diarrhea or constipation GU: Denies dysuria, frequency, hesitancy or incontinence MS: Denies muscle aches, joint pain or swelling Neuro: Denies headache, neurologic deficits (focal weakness, numbness, tingling), abnormal gait Psych: Denies anxiety, depression, SI/HI/AVH Skin: Denies new rashes or lesions ID: Denies sick contacts, exotic exposures, travel  Objective: Vitals:   11/12/18 1428 11/12/18 2057 11/13/18 0459 11/13/18 0929  BP: (!) 155/72 101/82 (!) 174/78 (!) 145/94  Pulse: 97 95 75 81  Resp: 19 18 18 18   Temp: 98 F (36.7 C) 98.8 F (37.1 C)  97.9 F (36.6 C)  TempSrc: Oral Oral  Oral  SpO2:   99% 100%  Weight:      Height:        Intake/Output Summary (Last 24 hours) at 11/13/2018 1012 Last data filed at 11/12/2018 1936 Gross per 24 hour  Intake 660 ml  Output -  Net 660 ml   Filed Weights   11/06/18 0954 11/06/18 1901  Weight: 74.8 kg 77.1 kg    Examination:  General exam: Alert to her name and place. Respiratory system: Clear to auscultation. Respiratory effort normal. Cardiovascular system: S1 & S2 heard, RRR. No JVD, murmurs, rubs, gallops or clicks. No pedal edema. Gastrointestinal system: Abdomen is nondistended, soft and nontender. No organomegaly or masses felt. Normal bowel sounds heard. Central nervous system: Alert and oriented. No focal neurological deficits. Extremities: Symmetric 5 x 5 power. Skin: No rashes, lesions or ulcers Psychiatry: Poor judgment.  For the most part she remains very quiet, flat affect.    Data Reviewed:   CBC: Recent Labs  Lab 11/06/18 1253 11/08/18 0831  WBC 8.3 7.5  NEUTROABS  --  5.0  HGB 13.8 14.5  HCT 41.9 44.2  MCV 90.1 91.7  PLT 383 962*   Basic Metabolic Panel: Recent Labs  Lab 11/06/18 1247 11/06/18 1253 11/07/18 0530 11/07/18 1528 11/08/18 0831  11/09/18 0945 11/12/18 0348  NA 144  --  142  --  140 141 140  K 3.1*  --  2.9*  --  4.0 4.3 4.1  CL 107  --  103  --  106 106 107  CO2 24  --  28  --  23 23 23   GLUCOSE 106*  --  129*  --  111* 108* 109*  BUN 32*  --  19  --  7 5* 7  CREATININE 1.13* 0.93 0.85  --  0.76 0.83 0.78  CALCIUM 9.1  --  9.0  --  9.0 9.2 9.2  MG  --   --   --  1.9 1.7  --   --    GFR: Estimated Creatinine Clearance: 81.9 mL/min (by C-G formula based on SCr of 0.78 mg/dL). Liver Function Tests: No results for input(s): AST, ALT, ALKPHOS, BILITOT, PROT, ALBUMIN in the last 168 hours. No results for input(s): LIPASE, AMYLASE in the last 168 hours. No results for input(s): AMMONIA in the last 168 hours. Coagulation Profile: No results for input(s): INR, PROTIME in the last 168 hours. Cardiac Enzymes: No results for input(s): CKTOTAL, CKMB, CKMBINDEX, TROPONINI in the last 168 hours. BNP (last 3 results) No results for input(s): PROBNP in the last 8760 hours. HbA1C: No results for input(s): HGBA1C in the last 72 hours. CBG: No results for input(s): GLUCAP in the last 168 hours. Lipid Profile: No results for input(s): CHOL, HDL, LDLCALC, TRIG, CHOLHDL, LDLDIRECT in the last 72 hours. Thyroid Function Tests: No results for input(s): TSH, T4TOTAL, FREET4, T3FREE, THYROIDAB in the last 72 hours. Anemia Panel: No results for input(s): VITAMINB12, FOLATE, FERRITIN, TIBC, IRON, RETICCTPCT in the last 72 hours. Sepsis Labs: No results for input(s): PROCALCITON, LATICACIDVEN in the last 168 hours.  Recent Results (from the past 240 hour(s))  SARS CORONAVIRUS 2 Nasal Swab Aptima Multi Swab     Status: None   Collection Time: 11/06/18 12:27 PM   Specimen: Aptima Multi Swab; Nasal Swab  Result Value Ref Range Status   SARS Coronavirus 2 NEGATIVE NEGATIVE Final    Comment: (NOTE) SARS-CoV-2 target nucleic acids are NOT DETECTED. The SARS-CoV-2 RNA is generally detectable in upper and lower respiratory  specimens during the acute phase of infection. Negative results do not preclude SARS-CoV-2 infection, do not rule out co-infections with other pathogens, and should not be used as the sole basis for treatment or other patient management decisions. Negative results must be combined with clinical observations, patient history, and epidemiological information. The expected result is Negative. Fact Sheet for Patients: SugarRoll.be Fact Sheet for Healthcare Providers: https://www.woods-mathews.com/ This test is not yet approved or cleared by the [place] FDA and  has been authorized for detection and/or diagnosis of SARS-CoV-2 by FDA under an Emergency Use Authorization (EUA). This EUA will remain  in effect (meaning this test can be used) for the duration of the COVID-19 declaration under Section 56 4(b)(1) of the Act, 21 U.S.C. section 360bbb-3(b)(1), unless the authorization is terminated or revoked sooner. Performed at [facility], [place] [address] [place], [place] [address]          Radiology Studies: No results found.      Scheduled Meds: . amLODipine  10 mg Oral Daily  . FLUoxetine  40  mg Oral BID  . OLANZapine  10 mg Oral QHS  . senna  1 tablet Oral BID   Continuous Infusions:   LOS: 5 days   Time spent= 15 mins    Ankit Joline Maxcyhirag Amin, MD [facility]  If 7PM-7AM, please contact night-coverage [email or web address] 11/13/2018, 10:12 AM

## 2018-11-13 NOTE — Progress Notes (Addendum)
Patient has been accepted to [facility] for tomorrow 8/19. Please call report to [phone] for the [facility]. Patient can go at anytime and will need transportation via sheriff due to being IVC'd. CSW will schedule transportation for tomorrow.   Accepting MD: Dr. Waynetta Pean, [place]  [phone]

## 2018-11-13 NOTE — Progress Notes (Signed)
Patient not wanting to eat or drink.  Did get her medicine in this am with some boost breeze.

## 2018-11-14 ENCOUNTER — Encounter: Payer: Self-pay | Admitting: Medical

## 2018-11-14 DIAGNOSIS — S40822A Blister (nonthermal) of left upper arm, initial encounter: Secondary | ICD-10-CM | POA: Diagnosis not present

## 2018-11-14 DIAGNOSIS — E860 Dehydration: Secondary | ICD-10-CM | POA: Diagnosis not present

## 2018-11-14 DIAGNOSIS — Z9114 Patient's other noncompliance with medication regimen: Secondary | ICD-10-CM | POA: Diagnosis not present

## 2018-11-14 DIAGNOSIS — I10 Essential (primary) hypertension: Secondary | ICD-10-CM | POA: Diagnosis not present

## 2018-11-14 DIAGNOSIS — L308 Other specified dermatitis: Secondary | ICD-10-CM | POA: Diagnosis not present

## 2018-11-14 DIAGNOSIS — R45851 Suicidal ideations: Secondary | ICD-10-CM | POA: Diagnosis not present

## 2018-11-14 DIAGNOSIS — F203 Undifferentiated schizophrenia: Secondary | ICD-10-CM | POA: Diagnosis not present

## 2018-11-14 DIAGNOSIS — S40821A Blister (nonthermal) of right upper arm, initial encounter: Secondary | ICD-10-CM | POA: Diagnosis not present

## 2018-11-14 DIAGNOSIS — F250 Schizoaffective disorder, bipolar type: Secondary | ICD-10-CM | POA: Diagnosis not present

## 2018-11-14 DIAGNOSIS — L231 Allergic contact dermatitis due to adhesives: Secondary | ICD-10-CM | POA: Diagnosis not present

## 2018-11-14 NOTE — Progress Notes (Signed)
PROGRESS NOTE    Amy Casey  ZOX:[id number] DOB: [birth year] DOA: 11/06/2018 PCP: Patient, No Pcp Per   Brief Narrative: 58 y.o.femalewith medical history significant ofschizophrenia with depression, hypertension and previous episodes of anorexia induced dehydration was brought into the ER on 11/06/2018 by her husband due to her developing severe depression with paranoia leading to anorexia and dehydration. Evaluation in the emergency department revealed that she was hemodynamically stable however she hadAKI as well as dehydration and hypokalemia. She was admitted to hospital service. Received IV fluids and some potassium replacement. She had all the signs of severe depression. Psychiatry was consulted on 8/12/2020and they recommended inpatient psychiatry hospitalization. Arrangements for patient to go to [facility] made by the case manager.    Assessment & Plan:   Principal Problem:   Acute psychosis ([place]) Active Problems:   Dehydration   Essential hypertension   Hypokalemia   Severe depression (HCC)   History of schizophrenia and depression with acute on chronic severe depression/acute psychosis:  Some auditory elucidation.  Very poor oral intake. Psychiatry recommending inpatient admission.  Started on Zyprexa.  Would benefit from inpatient psych.  Acute kidney injury: Resolvedwith hydration  Hypokalemia: Resolved.  Hypertension:Amlodipine increased to 10 mg daily..    DVT prophylaxis: Ambulatory Code Status: Full code Family Communication: None Disposition Plan: Discharge today to [facility]  Consultants:   Psychiatry  Procedures:   None  Antimicrobials:   None   Subjective: Sitting up in the bed this morning during my evaluation.  Remains mute most of the time.  Answered me once stating she has no complaints.  Review of Systems Otherwise negative except as per HPI, including: Difficult to obtain as patient is not willing to speak  Objective: Vitals:   11/13/18 0929 11/13/18 1243 11/13/18 2105 11/14/18 0630  BP: (!) 145/94 (!) 146/91 134/85 (!) 141/90  Pulse: 81 80 94 91  Resp: 18 17 18 18   Temp: 97.9 F (36.6 C) 97.9 F (36.6 C) 98.4 F (36.9 C) 98.4 F (36.9 C)  TempSrc: Oral Oral Oral Axillary  SpO2: 100% 100% 95% 98%  Weight:      Height:        Intake/Output Summary (Last 24 hours) at 11/14/2018 1041 Last data filed at 11/14/2018 0030 Gross per 24 hour  Intake 0 ml  Output 150 ml  Net -150 ml   Filed Weights   11/06/18 0954 11/06/18 1901  Weight: 74.8 kg 77.1 kg    Examination:  General exam: Appears alert to her name but does not wish to answer any other questions. Respiratory system: Clear to auscultation bilaterally Cardiovascular system: Normal sinus rhythm Gastrointestinal system: Abdomen is nontender nondistended. Central nervous system: Alert and oriented. No focal neurological deficits. Extremities: Symmetric 5 x 5 power. Skin: No rashes, lesions or ulcers Psychiatry: Poor judgment and insight.  Patient remains quiet.  Flat affect.  Does not want to engage in conversation.   Data Reviewed:   CBC: Recent Labs  Lab 11/08/18 0831  WBC 7.5  NEUTROABS 5.0  HGB 14.5  HCT 44.2  MCV 91.7  PLT 811*   Basic Metabolic Panel: Recent Labs  Lab 11/07/18 1528 11/08/18 0831 11/09/18 0945 11/12/18 0348  NA  --  140 141 140  K  --  4.0 4.3 4.1  CL  --  106 106 107  CO2  --  23 23 23   GLUCOSE  --  111* 108* 109*  BUN  --  7 5* 7  CREATININE  --  0.76 0.83 0.78  CALCIUM  --  9.0 9.2 9.2  MG 1.9 1.7  --   --    GFR: Estimated Creatinine Clearance: 81.9 mL/min (by C-G formula based on SCr of 0.78 mg/dL). Liver Function Tests: No results for input(s): AST, ALT, ALKPHOS, BILITOT, PROT, ALBUMIN in the last 168 hours. No results for input(s): LIPASE, AMYLASE in the last 168 hours. No results for input(s): AMMONIA in the last 168 hours. Coagulation Profile: No results for input(s):  INR, PROTIME in the last 168 hours. Cardiac Enzymes: No results for input(s): CKTOTAL, CKMB, CKMBINDEX, TROPONINI in the last 168 hours. BNP (last 3 results) No results for input(s): PROBNP in the last 8760 hours. HbA1C: No results for input(s): HGBA1C in the last 72 hours. CBG: No results for input(s): GLUCAP in the last 168 hours. Lipid Profile: No results for input(s): CHOL, HDL, LDLCALC, TRIG, CHOLHDL, LDLDIRECT in the last 72 hours. Thyroid Function Tests: No results for input(s): TSH, T4TOTAL, FREET4, T3FREE, THYROIDAB in the last 72 hours. Anemia Panel: No results for input(s): VITAMINB12, FOLATE, FERRITIN, TIBC, IRON, RETICCTPCT in the last 72 hours. Sepsis Labs: No results for input(s): PROCALCITON, LATICACIDVEN in the last 168 hours.  Recent Results (from the past 240 hour(s))  SARS CORONAVIRUS 2 Nasal Swab Aptima Multi Swab     Status: None   Collection Time: 11/06/18 12:27 PM   Specimen: Aptima Multi Swab; Nasal Swab  Result Value Ref Range Status   SARS Coronavirus 2 NEGATIVE NEGATIVE Final    Comment: (NOTE) SARS-CoV-2 target nucleic acids are NOT DETECTED. The SARS-CoV-2 RNA is generally detectable in upper and lower respiratory specimens during the acute phase of infection. Negative results do not preclude SARS-CoV-2 infection, do not rule out co-infections with other pathogens, and should not be used as the sole basis for treatment or other patient management decisions. Negative results must be combined with clinical observations, patient history, and epidemiological information. The expected result is Negative. Fact Sheet for Patients: HairSlick.nohttps://www.fda.gov/media/138098/download Fact Sheet for Healthcare Providers: quierodirigir.comhttps://www.fda.gov/media/138095/download This test is not yet approved or cleared by the [place] FDA and  has been authorized for detection and/or diagnosis of SARS-CoV-2 by FDA under an Emergency Use Authorization (EUA). This EUA will remain   in effect (meaning this test can be used) for the duration of the COVID-19 declaration under Section 56 4(b)(1) of the Act, 21 U.S.C. section 360bbb-3(b)(1), unless the authorization is terminated or revoked sooner. Performed at [facility], [address] [place], [place] [address]          Radiology Studies: No results found.      Scheduled Meds: . amLODipine  10 mg Oral Daily  . FLUoxetine  40 mg Oral BID  . OLANZapine  10 mg Oral QHS  . senna  1 tablet Oral BID   Continuous Infusions:   LOS: 6 days   Time spent= 15 mins    Merrissa Giacobbe Joline Maxcyhirag Nikko Quast, MD [facility]  If 7PM-7AM, please contact night-coverage [email or web address] 11/14/2018, 10:41 AM

## 2018-11-14 NOTE — Progress Notes (Signed)
Nsg Discharge Note  Report given to [facility] at [facility].   Admit Date:  11/06/2018 Discharge date: 11/14/2018   Amy Casey to be D/C'd Amy Casey per MD order.  AVS completed.  Copy for chart, and copy for patient signed, and dated. Patient/caregiver able to verbalize understanding.  Discharge Medication: Allergies as of 11/14/2018   Not on File     Medication List    TAKE these medications   amLODipine 10 MG tablet Commonly known as: NORVASC Take 1 tablet (10 mg total) by mouth daily. What changed:   medication strength  how much to take   FLUoxetine 40 MG capsule Commonly known as: PROZAC Take 40 mg by mouth 2 (two) times daily.   OLANZapine 10 MG tablet Commonly known as: ZYPREXA Take 1 tablet (10 mg total) by mouth at bedtime.       Discharge Assessment: Vitals:   11/13/18 2105 11/14/18 0630  BP: 134/85 (!) 141/90  Pulse: 94 91  Resp: 18 18  Temp: 98.4 F (36.9 C) 98.4 F (36.9 C)  SpO2: 95% 98%   Skin clean, dry and intact without evidence of skin break down, no evidence of skin tears noted. IV catheter discontinued intact. Site without signs and symptoms of complications - no redness or edema noted at insertion site, patient denies c/o pain - only slight tenderness at site.  Dressing with slight pressure applied.  D/c Instructions-Education: Discharge instructions given to patient/family with verbalized understanding. D/c education completed with patient/family including follow up instructions, medication list, d/c activities limitations if indicated, with other d/c instructions as indicated by MD - patient able to verbalize understanding, all questions fully answered. Patient instructed to return to ED, call 911, or call MD for any changes in condition.  Patient escorted via [place], and D/C home via private auto.  Erasmo Leventhal, RN 11/14/2018 9:43 AM

## 2018-11-16 DIAGNOSIS — L308 Other specified dermatitis: Secondary | ICD-10-CM | POA: Diagnosis not present

## 2018-12-15 ENCOUNTER — Other Ambulatory Visit: Payer: Self-pay

## 2018-12-15 DIAGNOSIS — Z20822 Contact with and (suspected) exposure to covid-19: Secondary | ICD-10-CM

## 2018-12-16 LAB — NOVEL CORONAVIRUS, NAA: SARS-CoV-2, NAA: NOT DETECTED

## 2019-02-15 IMAGING — DX DG CHEST 1V PORT
1 series · 1 of 1 positions shown · non-contrast
Comparison: 02/20/2014 .

CLINICAL DATA: Routine health maintenance.

EXAM:
PORTABLE CHEST 1 VIEW

[chest ap]
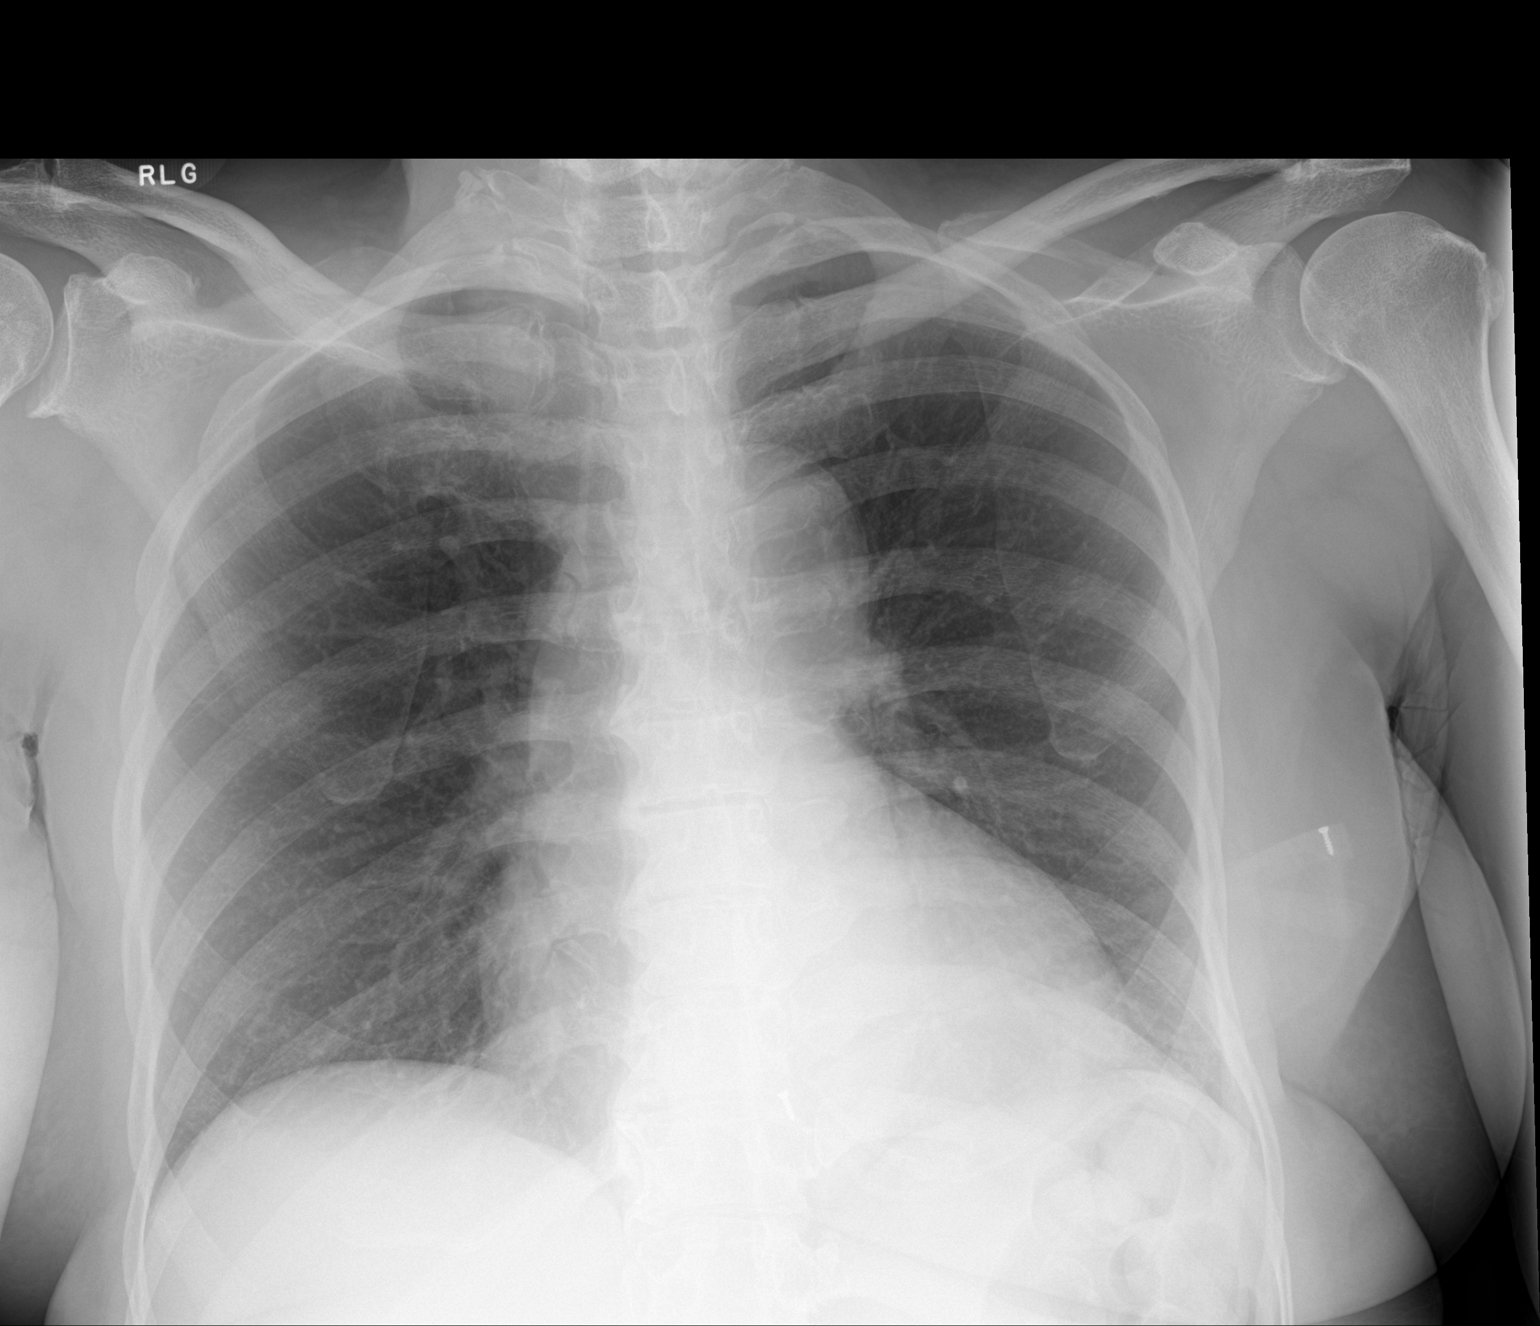

[1 of 1 positions shown; findings below may reference images not displayed]

FINDINGS: Mediastinum hilar structures are normal. Borderline cardiomegaly. No
pulmonary venous congestion. No focal infiltrate. No pleural
effusion or pneumothorax. Degenerative changes thoracic spine .
IMPRESSION: Borderline cardiomegaly. No pulmonary venous congestion. No acute
pulmonary disease .

## 2019-04-15 ENCOUNTER — Other Ambulatory Visit: Payer: Self-pay

## 2019-04-15 ENCOUNTER — Inpatient Hospital Stay (HOSPITAL_COMMUNITY)
Admission: EM | Admit: 2019-04-15 | Discharge: 2019-05-04 | DRG: 640 | Disposition: A | Discharge status: Home / Self Care | Payer: BC Managed Care – PPO | Attending: Family Medicine | Admitting: Family Medicine

## 2019-04-15 DIAGNOSIS — G9349 Other encephalopathy: Secondary | ICD-10-CM | POA: Diagnosis not present

## 2019-04-15 DIAGNOSIS — Z9114 Patient's other noncompliance with medication regimen: Secondary | ICD-10-CM | POA: Diagnosis not present

## 2019-04-15 DIAGNOSIS — Z79899 Other long term (current) drug therapy: Secondary | ICD-10-CM | POA: Diagnosis not present

## 2019-04-15 DIAGNOSIS — K5900 Constipation, unspecified: Secondary | ICD-10-CM | POA: Diagnosis not present

## 2019-04-15 DIAGNOSIS — Z888 Allergy status to other drugs, medicaments and biological substances status: Secondary | ICD-10-CM | POA: Diagnosis not present

## 2019-04-15 DIAGNOSIS — E860 Dehydration: Secondary | ICD-10-CM | POA: Diagnosis not present

## 2019-04-15 DIAGNOSIS — Z8249 Family history of ischemic heart disease and other diseases of the circulatory system: Secondary | ICD-10-CM | POA: Diagnosis not present

## 2019-04-15 DIAGNOSIS — Z20822 Contact with and (suspected) exposure to covid-19: Secondary | ICD-10-CM | POA: Diagnosis not present

## 2019-04-15 DIAGNOSIS — F209 Schizophrenia, unspecified: Secondary | ICD-10-CM | POA: Diagnosis present

## 2019-04-15 DIAGNOSIS — F061 Catatonic disorder due to known physiological condition: Secondary | ICD-10-CM

## 2019-04-15 DIAGNOSIS — R768 Other specified abnormal immunological findings in serum: Secondary | ICD-10-CM | POA: Diagnosis not present

## 2019-04-15 DIAGNOSIS — F329 Major depressive disorder, single episode, unspecified: Secondary | ICD-10-CM | POA: Diagnosis not present

## 2019-04-15 DIAGNOSIS — R6510 Systemic inflammatory response syndrome (SIRS) of non-infectious origin without acute organ dysfunction: Secondary | ICD-10-CM | POA: Diagnosis not present

## 2019-04-15 DIAGNOSIS — N179 Acute kidney failure, unspecified: Secondary | ICD-10-CM | POA: Diagnosis not present

## 2019-04-15 DIAGNOSIS — Z833 Family history of diabetes mellitus: Secondary | ICD-10-CM | POA: Diagnosis not present

## 2019-04-15 DIAGNOSIS — A419 Sepsis, unspecified organism: Secondary | ICD-10-CM | POA: Diagnosis not present

## 2019-04-15 DIAGNOSIS — N390 Urinary tract infection, site not specified: Secondary | ICD-10-CM | POA: Diagnosis not present

## 2019-04-15 DIAGNOSIS — Z5329 Procedure and treatment not carried out because of patient's decision for other reasons: Secondary | ICD-10-CM | POA: Diagnosis not present

## 2019-04-15 DIAGNOSIS — R6520 Severe sepsis without septic shock: Secondary | ICD-10-CM | POA: Diagnosis not present

## 2019-04-15 DIAGNOSIS — G9340 Encephalopathy, unspecified: Secondary | ICD-10-CM | POA: Diagnosis present

## 2019-04-15 DIAGNOSIS — Z046 Encounter for general psychiatric examination, requested by authority: Secondary | ICD-10-CM | POA: Diagnosis not present

## 2019-04-15 DIAGNOSIS — F202 Catatonic schizophrenia: Secondary | ICD-10-CM

## 2019-04-15 DIAGNOSIS — F200 Paranoid schizophrenia: Secondary | ICD-10-CM | POA: Diagnosis not present

## 2019-04-15 DIAGNOSIS — E870 Hyperosmolality and hypernatremia: Secondary | ICD-10-CM | POA: Diagnosis present

## 2019-04-15 DIAGNOSIS — A515 Early syphilis, latent: Secondary | ICD-10-CM | POA: Diagnosis not present

## 2019-04-15 DIAGNOSIS — Z881 Allergy status to other antibiotic agents status: Secondary | ICD-10-CM | POA: Diagnosis not present

## 2019-04-15 DIAGNOSIS — R4182 Altered mental status, unspecified: Secondary | ICD-10-CM

## 2019-04-15 DIAGNOSIS — R0682 Tachypnea, not elsewhere classified: Secondary | ICD-10-CM | POA: Diagnosis not present

## 2019-04-15 DIAGNOSIS — A530 Latent syphilis, unspecified as early or late: Secondary | ICD-10-CM | POA: Diagnosis not present

## 2019-04-15 DIAGNOSIS — Z9119 Patient's noncompliance with other medical treatment and regimen: Secondary | ICD-10-CM | POA: Diagnosis not present

## 2019-04-15 DIAGNOSIS — Z91128 Patient's intentional underdosing of medication regimen for other reason: Secondary | ICD-10-CM

## 2019-04-15 DIAGNOSIS — Z91199 Patient's noncompliance with other medical treatment and regimen due to unspecified reason: Secondary | ICD-10-CM

## 2019-04-15 DIAGNOSIS — E876 Hypokalemia: Secondary | ICD-10-CM | POA: Diagnosis not present

## 2019-04-15 DIAGNOSIS — E162 Hypoglycemia, unspecified: Secondary | ICD-10-CM | POA: Diagnosis present

## 2019-04-15 DIAGNOSIS — I10 Essential (primary) hypertension: Secondary | ICD-10-CM | POA: Diagnosis not present

## 2019-04-15 DIAGNOSIS — Z5320 Procedure and treatment not carried out because of patient's decision for unspecified reasons: Secondary | ICD-10-CM | POA: Diagnosis not present

## 2019-04-15 DIAGNOSIS — F331 Major depressive disorder, recurrent, moderate: Secondary | ICD-10-CM | POA: Diagnosis not present

## 2019-04-15 MED ORDER — OLANZAPINE 10 MG IM SOLR
10.0000 mg | Freq: Once | INTRAMUSCULAR | Status: DC
  Filled 2019-04-15: qty 10

## 2019-04-15 MED ORDER — FLUOXETINE HCL 20 MG PO CAPS
40.0000 mg | ORAL_CAPSULE | Freq: Two times a day (BID) | ORAL | Status: DC
  Filled 2019-04-15 (×4): qty 2

## 2019-04-15 MED ORDER — OLANZAPINE 10 MG PO TABS
20.0000 mg | ORAL_TABLET | Freq: Every day | ORAL | Status: DC
  Filled 2019-04-15: qty 2

## 2019-04-15 MED ORDER — OLANZAPINE 10 MG PO TABS
20.0000 mg | ORAL_TABLET | Freq: Every day | ORAL | Status: DC
  Filled 2019-04-15 (×4): qty 2

## 2019-04-15 MED ORDER — AMLODIPINE BESYLATE 5 MG PO TABS
5.0000 mg | ORAL_TABLET | Freq: Every day | ORAL | Status: DC
  Administered 2019-04-18: 5 mg via ORAL
  Filled 2019-04-15 (×2): qty 1

## 2019-04-15 NOTE — ED Provider Notes (Signed)
[facility] EMERGENCY DEPARTMENT Provider Note   CSN: [id number] Arrival date & time: 04/15/19  1415     History Chief Complaint  Patient presents with  . Hypertension    Zeva Leber is a 59 y.o. female with history of HTN and schizophrenia who presents with hypertension. She is a challenging historian and cannot really tell me why she is here. Apparently she sent to [facility] today and was told that her BP was high. The patient has no specific complaints - she denies headache, chest pain, SOB, cough, abdominal pain, N/V/D, urinary symptoms. When I ask about her PO intake she states "they said something about that". In August of this year she was admitted for hypokalemia and AKI due to poor PO intake due to her schizophrenia. She denies AVH, SI, HI. I called [facility] and they state that their office is closed today due to the holiday and they haven't seen her since November 30. She got a one month supply of Zyprexa and Prozac at that time and they only gave enough for one month so she would be out of her meds. I spoke with her daughter on the phone who states that she noticed a change in her mother about 3 days ago. She states that this has happened before but they have been trying to stay on top of it so it didn't get to the point where it has in the past when she had to be admitted. She notes the patient has been under a lot of stress due to family issues.  Level 5 caveat due to psych    HPI     Past Medical History:  Diagnosis Date  . Depression    Dr. Mila Homer, [facility]; hospitalization 07/2010  . History of echocardiogram 2011   [facility]  . History of renal stone   . Hypertension   . Hypokalemia   . Nephrolithiasis   . Obesity   . Schizophrenia (HCC)   . Wears glasses     Patient Active Problem List   Diagnosis Date Noted  . Hypokalemia 11/07/2018  . Severe depression (HCC) 11/07/2018  . Dehydration 11/06/2018  . Essential hypertension 11/06/2018  .  Acute psychosis (HCC) 11/06/2018  . Screening for cancer 08/21/2018  . Dysuria 08/21/2018  . Urinary frequency 08/21/2018  . 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 Psychosis (HCC) 08/15/2018  . Physical deconditioning 08/09/2018  . Encephalopathy 07/31/2018  . Acute metabolic encephalopathy 07/30/2018  . Acute lower UTI   . AKI (acute kidney injury) (HCC)   . Hypokalemia   . Dehydration 07/19/2017  . Hypernatremia 07/19/2017  . UTI (urinary tract infection) 07/19/2017  . Schizophrenia (HCC) 07/18/2017  . High risk medication use 07/25/2016  . Involuntary commitment   . Paranoid schizophrenia (HCC) 06/16/2015  . Noncompliance 06/16/2015  . Altered awareness, transient   . Altered mental status 02/19/2014  . Essential hypertension, benign 02/10/2012  . Tooth decay 02/10/2012  . Obesity 02/10/2012  . Impaired fasting blood sugar 02/10/2012    Past Surgical History:  Procedure Laterality Date  . WISDOM TOOTH EXTRACTION       OB History   No obstetric history on file.     Family History  Problem Relation Age of Onset  . Diabetes Mother   . Heart disease Mother   . Chronic Renal Failure Mother   . Cancer Maternal Uncle        lung  . Kidney disease Mother  dialysis  . Stroke Neg Hx   . Hypertension Neg Hx   . Hyperlipidemia Neg Hx     Social History   Tobacco Use  . Smoking status: Never Smoker  . Smokeless tobacco: Never Used  Substance Use Topics  . Alcohol use: No  . Drug use: No    Home Medications Prior to Admission medications   Medication Sig Start Date End Date Taking? Authorizing Provider  amLODipine (NORVASC) 10 MG tablet Take 1 tablet (10 mg total) by mouth daily. 08/21/18   Clapacs, Jackquline Denmark, MD  amLODipine (NORVASC) 10 MG tablet Take 1 tablet (10 mg total) by mouth daily. 11/13/18   Amin, Loura Halt, MD  docusate sodium (COLACE) 100 MG capsule Take 2 capsules (200 mg total) by mouth daily. 08/20/18    Clapacs, Jackquline Denmark, MD  FLUoxetine (PROZAC) 20 MG capsule Take 3 capsules (60 mg total) by mouth daily. 08/21/18   Clapacs, Jackquline Denmark, MD  FLUoxetine (PROZAC) 40 MG capsule Take 40 mg by mouth 2 (two) times daily.    [provider]  OLANZapine (ZYPREXA) 10 MG tablet Take 1 tablet (10 mg total) by mouth at bedtime. 11/12/18   Amin, Ankit Chirag, MD  OLANZapine (ZYPREXA) 20 MG tablet Take 1 tablet (20 mg total) by mouth at bedtime. 08/20/18   Clapacs, Jackquline Denmark, MD    Allergies    Paliperidone, Sulfa antibiotics, and Lisinopril  Review of Systems   Review of Systems  Unable to perform ROS: Psychiatric disorder    Physical Exam Updated Vital Signs BP (!) 186/113 (BP Location: Right Arm)   Pulse 79   Temp 98.1 F (36.7 C) (Oral)   Resp 16   SpO2 99%   Physical Exam Vitals and nursing note reviewed.  Constitutional:      General: She is not in acute distress.    Appearance: She is well-developed.  HENT:     Head: Normocephalic and atraumatic.  Eyes:     General: No scleral icterus.       Right eye: No discharge.        Left eye: No discharge.     Conjunctiva/sclera: Conjunctivae normal.     Pupils: Pupils are equal, round, and reactive to light.  Cardiovascular:     Rate and Rhythm: Normal rate and regular rhythm.  Pulmonary:     Effort: Pulmonary effort is normal. No respiratory distress.     Breath sounds: Normal breath sounds.  Abdominal:     General: There is no distension.     Palpations: Abdomen is soft.     Tenderness: There is no abdominal tenderness.  Musculoskeletal:     Cervical back: Normal range of motion.  Skin:    General: Skin is warm and dry.  Neurological:     Mental Status: She is alert and oriented to person, place, and time.  Psychiatric:        Attention and Perception: Attention normal.        Mood and Affect: Mood normal.        Speech: She is noncommunicative.        Behavior: Behavior is uncooperative.        Cognition and Memory: Cognition  is impaired.     ED Results / Procedures / Treatments   Labs (all labs ordered are listed, but only abnormal results are displayed) Labs Reviewed  RESPIRATORY PANEL BY RT PCR (FLU A&B, COVID)  COMPREHENSIVE METABOLIC PANEL  ETHANOL  RAPID URINE DRUG SCREEN,  HOSP PERFORMED  CBC WITH DIFFERENTIAL/PLATELET  URINALYSIS, ROUTINE W REFLEX MICROSCOPIC    EKG None  Radiology No results found.  Procedures Procedures (including critical care time)  Medications Ordered in ED Medications  amLODipine (NORVASC) tablet 5 mg (has no administration in time range)  FLUoxetine (PROZAC) capsule 40 mg (has no administration in time range)  OLANZapine (ZYPREXA) tablet 20 mg (has no administration in time range)    ED Course  I have reviewed the triage vital signs and the nursing notes.  Pertinent labs & imaging results that were available during my care of the patient were reviewed by me and considered in my medical decision making (see chart for details).  59 year old female presents with AMS and HTN. She has known schizophrenia and it's suspected she has not been taking her medicines. BP is elevated here and she likely hasn't been taking her Amlodipine either. I was unable to get in contact with her husband but I was able to call her daughter who states that she has had similar episodes before only this time is not as bad. On exam she is attentive but not able to really tell me why she is here. Her speech is hard to understand and she is not able to answer questions appropriately. She is following commands although has been uncooperative with nursing.   There has been a long delay in obtaining labs and UA due to patient's cooperativeness. IVC paper will filled out. TTS evaluated pt and advised re-eval in the AM. At shift change labs are still pending since nursing are attempting to obtain.  At shift change labs are pending - care signed out to Andris Baumann PA-C who will dispo  MDM  Rules/Calculators/A&P                      Final Clinical Impression(s) / ED Diagnoses Final diagnoses:  Paranoid schizophrenia ([place])  Altered mental status, unspecified altered mental status type    Rx / DC Orders ED Discharge Orders    None       Recardo Evangelist, PA-C 04/16/19 Lawanda Cousins, MD 04/20/19 1757

## 2019-04-15 NOTE — ED Notes (Signed)
[employer] called for [employer] to be served

## 2019-04-15 NOTE — ED Triage Notes (Signed)
Pt was at [facility] this morning and they told her that her blood pressure was high. Pt has sporadically been taking amlodipine but has not taken it in over a month. Pt has no complaints.

## 2019-04-15 NOTE — ED Notes (Addendum)
This tech went to triage to bring pt back but pt was confused and did not want to go back to the room. Kim RN persuaded pt to go back to her room to see an EDP and pt agreed. Pt refuses to put on the hospital gown or being hooked up to the monitor.

## 2019-04-15 NOTE — ED Notes (Signed)
[phone] pts husband Clearance, call with update

## 2019-04-15 NOTE — ED Notes (Signed)
TTS has been completed. Pt. Still refuses to allow [place] to connect her to blood pressure cuff or give medication. She will shake her head yes or no but remains in bed and says nothing else.

## 2019-04-15 NOTE — BH Assessment (Signed)
Tele Assessment Note   Patient Name: Amy Casey MRN: [id number] Referring Physician: Dr. Varney Biles, MD Location of Patient: [facility] Location of Provider: [place] is a 59 y.o. female who was brought to [facility] by her spouse after they had attempted to go to [facility] and found it was closed due to the holiday. A nurse from [place], however, took her BP and found it to be so high that she advised pt's husband to take pt to the hospital immediately, which he did. Pt required coaxing to go to triage and into a room, and she continues to refuse to have her blood pressure cuff attached, to be given medication, or to change into scrubs.  Pt was unable to communicate effectively w/ clinician when clinician called to complete the [facility] Assessment; pt attempted to sound out the words she was attempting to say, but was, ultimately, unable to provide answers, including today's date and who brought her to the hospital.  Pt gave verbal consent for clinician to contact her daughter, who was mentioned in another note in her chart and who is listed as one of her emergency contacts.  Pt's orientation was unable to be completed in its entirety; pt knew her name, date of birth, the city she is currently in, and where she is at, but she was unable to identify what today's date is. Pt's memory was UTA. Pt was cooperative and attempted to complete the assessment. Pt's insight, judgement, and impulse control is UTA at this time.   Diagnosis: F20.9, Schizophrenia   Past Medical History:  Past Medical History:  Diagnosis Date  . Depression    Dr. Silvio Pate, [place]; hospitalization 07/2010  . History of echocardiogram 2011   [facility]  . History of renal stone   . Hypertension   . Hypokalemia   . Nephrolithiasis   . Obesity   . Schizophrenia ([place])   . Wears glasses     Past Surgical History:  Procedure Laterality Date  . WISDOM TOOTH EXTRACTION      Family  History:  Family History  Problem Relation Age of Onset  . Diabetes Mother   . Heart disease Mother   . Chronic Renal Failure Mother   . Cancer Maternal Uncle        lung  . Kidney disease Mother        dialysis  . Stroke Neg Hx   . Hypertension Neg Hx   . Hyperlipidemia Neg Hx     Social History:  reports that she has never smoked. She has never used smokeless tobacco. She reports that she does not drink alcohol or use drugs.  Additional Social History:  Alcohol / Drug Use Pain Medications: Please see MAR Prescriptions: Please see MAR Over the Counter: Please see MAR History of alcohol / drug use?: (UTA) Longest period of sobriety (when/how long): UTA  CIWA: CIWA-Ar BP: (!) 186/113 Pulse Rate: 79 Nausea and Vomiting: no nausea and no vomiting Tactile Disturbances: none Tremor: no tremor Auditory Disturbances: not present Paroxysmal Sweats: no sweat visible Visual Disturbances: not present Anxiety: no anxiety, at ease Headache, Fullness in Head: none present Agitation: normal activity Orientation and Clouding of Sensorium: cannot do serial additions or is uncertain about date CIWA-Ar Total: 1 COWS:    Allergies:  Allergies  Allergen Reactions  . Paliperidone Other (See Comments)    Angioedema, drooling, slurred speech, ptosis  . Lisinopril Rash and Other (See Comments)    Angioedema   .  Sulfa Antibiotics Itching    Home Medications: (Not in a hospital admission)   OB/GYN Status:  No LMP recorded. (Menstrual status: Perimenopausal).  General Assessment Data Location of Assessment: [facility] ED TTS Assessment: In system Is this a Tele or Face-to-Face Assessment?: Tele Assessment Is this an Initial Assessment or a Re-assessment for this encounter?: Initial Assessment Patient Accompanied by:: N/A Language Other than English: No Living Arrangements: Other (Comment)(Pt lives with her husband) What gender do you identify as?: Female Marital status: Married [place]  name: UTA Pregnancy Status: No Living Arrangements: Spouse/significant other Can pt return to current living arrangement?: Yes Admission Status: Voluntary Is patient capable of signing voluntary admission?: Yes Referral Source: Self/Family/Friend Insurance type: BCBS     Crisis Care Plan Living Arrangements: Spouse/significant other Legal Guardian: Other:(Self) Name of Psychiatrist: Transport planner Name of Therapist: Transport planner  Education Status Is patient currently in school?: No Is the patient employed, unemployed or receiving disability?: (UTA)  Risk to self with the past 6 months Suicidal Ideation: (UTA) Has patient been a risk to self within the past 6 months prior to admission? : (UTA) Suicidal Intent: (UTA) Has patient had any suicidal intent within the past 6 months prior to admission? : (UTA) Is patient at risk for suicide?: (UTA) Suicidal Plan?: (UTA) Has patient had any suicidal plan within the past 6 months prior to admission? : (UTA) Access to Means: (UTA) What has been your use of drugs/alcohol within the last 12 months?: UTA Previous Attempts/Gestures: (UTA) How many times?: (UTA) Other Self Harm Risks: Pt has been out of her medication since roughly Dec 30 Triggers for Past Attempts: (UTA) Intentional Self Injurious Behavior: (UTA) Family Suicide History: Unable to assess Recent stressful life event(s): Other (Comment)(Assisting in raising grandchildren) Persecutory voices/beliefs?: Rich Reining) Depression: (UTA) Depression Symptoms: Despondent(UTA) Substance abuse history and/or treatment for substance abuse?: (UTA) Suicide prevention information given to non-admitted patients: (UTA)  Risk to Others within the past 6 months Homicidal Ideation: (UTA) Does patient have any lifetime risk of violence toward others beyond the six months prior to admission? : (UTA) Thoughts of Harm to Others: (UTA) Current Homicidal Intent: (UTA) Current Homicidal Plan: (UTA) Access to  Homicidal Means: (UTA) Identified Victim: UTA History of harm to others?: (UTA) Assessment of Violence: (UTA) Violent Behavior Description: UTA Does patient have access to weapons?: (UTA) Criminal Charges Pending?: (UTA) Does patient have a court date: (UTA) Is patient on probation?: (UTA)  Psychosis Hallucinations: (UTA) Delusions: (UTA)  Mental Status Report Appearance/Hygiene: Unremarkable Eye Contact: Fair Motor Activity: Freedom of movement(Pt was lying down in her hospital bed) Speech: Soft, Incoherent, Other (Comment), Aphasic(Pt understood clinician but could not form sentences) Level of Consciousness: Quiet/awake Mood: Anxious Affect: Apprehensive, Appropriate to circumstance Anxiety Level: Moderate Thought Processes: Coherent Judgement: Unable to Assess Orientation: Unable to assess Obsessive Compulsive Thoughts/Behaviors: Unable to Assess  Cognitive Functioning Concentration: Unable to Assess Memory: Unable to Assess Is patient IDD: No Insight: Unable to Assess Impulse Control: Unable to Assess Appetite: (UTA) Have you had any weight changes? : (UTA) Sleep: Unable to Assess Total Hours of Sleep: (UTA) Vegetative Symptoms: Unable to Assess  ADLScreening [facility] Assessment Services) Patient's cognitive ability adequate to safely complete daily activities?: (UTA) Patient able to express need for assistance with ADLs?: (UTA) Independently performs ADLs?: (UTA)  Prior Inpatient Therapy Prior Inpatient Therapy: Yes Prior Therapy Dates: 1998, 2010 - 1-2x/year every year after that Prior Therapy Facilty/Provider(s): [facility] Reason for Treatment: Schizophrenia  Prior Outpatient Therapy Prior Outpatient Therapy:  No Does patient have an ACCT team?: No Does patient have Intensive In-House Services?  : No Does patient have [facility] services? : Yes Does patient have P4CC services?: No  ADL Screening (condition at time of admission) Patient's cognitive  ability adequate to safely complete daily activities?: (UTA) Is the patient deaf or have difficulty hearing?: (UTA) Does the patient have difficulty seeing, even when wearing glasses/contacts?: (UTA) Does the patient have difficulty concentrating, remembering, or making decisions?: (UTA) Patient able to express need for assistance with ADLs?: (UTA) Does the patient have difficulty dressing or bathing?: (UTA) Independently performs ADLs?: (UTA) Does the patient have difficulty walking or climbing stairs?: (UTA) Weakness of Legs: (UTA) Weakness of Arms/Hands: (UTA)  Home Assistive Devices/Equipment Home Assistive Devices/Equipment: (UTA)  Therapy Consults (therapy consults require a physician order) PT Evaluation Needed: (UTA) OT Evalulation Needed: (UTA) SLP Evaluation Needed: (UTA) Abuse/Neglect Assessment (Assessment to be complete while patient is alone) Abuse/Neglect Assessment Can Be Completed: Unable to assess, patient is non-responsive or altered mental status Values / Beliefs Cultural Requests During Hospitalization: (UTA) Spiritual Requests During Hospitalization: (UTA) Consults Spiritual Care Consult Needed: (UTA) Transition of Care Team Consult Needed: (UTA) Advance Directives (For Healthcare) Does Patient Have a Medical Advance Directive?: Unable to assess, patient is non-responsive or altered mental status          Disposition: Nira Conn, NP, reviewed pt's chart and information and determined pt should be observed overnight for safety and stability and re-assessed in the morning by psychiatry. This information was provided to pt's nurse, Joe RN, at 2054.  Disposition Initial Assessment Completed for this Encounter: Yes  This service was provided via telemedicine using a 2-way, interactive audio and video technology.  Names of all persons participating in this telemedicine service and their role in this encounter. Name: Amy Casey Role: Patient  Name:  Dorothe Pea Role: Patient's Daughter  Name: Nira Conn Role: Nurse Practitioner  Name: Duard Brady Role: Clinician    Ralph Dowdy 04/15/2019 8:37 PM

## 2019-04-16 ENCOUNTER — Encounter (HOSPITAL_COMMUNITY): Payer: Self-pay | Admitting: Internal Medicine

## 2019-04-16 DIAGNOSIS — Z9114 Patient's other noncompliance with medication regimen: Secondary | ICD-10-CM | POA: Diagnosis not present

## 2019-04-16 DIAGNOSIS — Z8249 Family history of ischemic heart disease and other diseases of the circulatory system: Secondary | ICD-10-CM | POA: Diagnosis not present

## 2019-04-16 DIAGNOSIS — F331 Major depressive disorder, recurrent, moderate: Secondary | ICD-10-CM | POA: Diagnosis not present

## 2019-04-16 DIAGNOSIS — N179 Acute kidney failure, unspecified: Secondary | ICD-10-CM

## 2019-04-16 DIAGNOSIS — F200 Paranoid schizophrenia: Secondary | ICD-10-CM | POA: Diagnosis present

## 2019-04-16 DIAGNOSIS — I10 Essential (primary) hypertension: Secondary | ICD-10-CM | POA: Diagnosis present

## 2019-04-16 DIAGNOSIS — F209 Schizophrenia, unspecified: Secondary | ICD-10-CM | POA: Diagnosis not present

## 2019-04-16 DIAGNOSIS — R768 Other specified abnormal immunological findings in serum: Secondary | ICD-10-CM | POA: Diagnosis not present

## 2019-04-16 DIAGNOSIS — E162 Hypoglycemia, unspecified: Secondary | ICD-10-CM | POA: Diagnosis present

## 2019-04-16 DIAGNOSIS — R6510 Systemic inflammatory response syndrome (SIRS) of non-infectious origin without acute organ dysfunction: Secondary | ICD-10-CM | POA: Diagnosis not present

## 2019-04-16 DIAGNOSIS — Z5329 Procedure and treatment not carried out because of patient's decision for other reasons: Secondary | ICD-10-CM | POA: Diagnosis not present

## 2019-04-16 DIAGNOSIS — Z91128 Patient's intentional underdosing of medication regimen for other reason: Secondary | ICD-10-CM | POA: Diagnosis not present

## 2019-04-16 DIAGNOSIS — Z833 Family history of diabetes mellitus: Secondary | ICD-10-CM | POA: Diagnosis not present

## 2019-04-16 DIAGNOSIS — F202 Catatonic schizophrenia: Secondary | ICD-10-CM | POA: Diagnosis not present

## 2019-04-16 DIAGNOSIS — Z9119 Patient's noncompliance with other medical treatment and regimen: Secondary | ICD-10-CM | POA: Diagnosis not present

## 2019-04-16 DIAGNOSIS — Z79899 Other long term (current) drug therapy: Secondary | ICD-10-CM | POA: Diagnosis not present

## 2019-04-16 DIAGNOSIS — Z5320 Procedure and treatment not carried out because of patient's decision for unspecified reasons: Secondary | ICD-10-CM | POA: Diagnosis not present

## 2019-04-16 DIAGNOSIS — E876 Hypokalemia: Secondary | ICD-10-CM | POA: Diagnosis present

## 2019-04-16 DIAGNOSIS — E870 Hyperosmolality and hypernatremia: Secondary | ICD-10-CM | POA: Diagnosis present

## 2019-04-16 DIAGNOSIS — E860 Dehydration: Secondary | ICD-10-CM | POA: Diagnosis present

## 2019-04-16 DIAGNOSIS — F329 Major depressive disorder, single episode, unspecified: Secondary | ICD-10-CM | POA: Diagnosis present

## 2019-04-16 DIAGNOSIS — K5900 Constipation, unspecified: Secondary | ICD-10-CM | POA: Diagnosis not present

## 2019-04-16 DIAGNOSIS — A530 Latent syphilis, unspecified as early or late: Secondary | ICD-10-CM | POA: Diagnosis present

## 2019-04-16 DIAGNOSIS — G9349 Other encephalopathy: Secondary | ICD-10-CM | POA: Diagnosis present

## 2019-04-16 DIAGNOSIS — N390 Urinary tract infection, site not specified: Secondary | ICD-10-CM | POA: Diagnosis present

## 2019-04-16 DIAGNOSIS — A419 Sepsis, unspecified organism: Secondary | ICD-10-CM | POA: Diagnosis not present

## 2019-04-16 DIAGNOSIS — Z20822 Contact with and (suspected) exposure to covid-19: Secondary | ICD-10-CM | POA: Diagnosis present

## 2019-04-16 LAB — BASIC METABOLIC PANEL
Anion gap: 14 (ref 5–15)
Anion gap: 14 (ref 5–15)
BUN: 21 mg/dL — ABNORMAL HIGH (ref 6–20)
BUN: 20 mg/dL (ref 6–20)
CO2: 25 mmol/L (ref 22–32)
CO2: 26 mmol/L (ref 22–32)
Calcium: 8.8 mg/dL — ABNORMAL LOW (ref 8.9–10.3)
Calcium: 9.1 mg/dL (ref 8.9–10.3)
Chloride: 105 mmol/L (ref 98–111)
Chloride: 106 mmol/L (ref 98–111)
Creatinine, Ser: 0.86 mg/dL (ref 0.44–1.00)
Creatinine, Ser: 0.91 mg/dL (ref 0.44–1.00)
GFR calc Af Amer: >60 mL/min (ref >60)
GFR calc Af Amer: >60 mL/min (ref >60)
GFR calc non Af Amer: >60 mL/min (ref >60)
GFR calc non Af Amer: >60 mL/min (ref >60)
Glucose, Bld: 85 mg/dL (ref 70–99)
Glucose, Bld: 70 mg/dL (ref 70–99)
Potassium: 2.8 mmol/L — ABNORMAL LOW (ref 3.5–5.1)
Potassium: 3.3 mmol/L — ABNORMAL LOW (ref 3.5–5.1)
Sodium: 144 mmol/L (ref 135–145)
Sodium: 146 mmol/L — ABNORMAL HIGH (ref 135–145)

## 2019-04-16 LAB — CBC WITH DIFFERENTIAL/PLATELET
Abs Immature Granulocytes: 0.02 10*3/uL (ref 0.00–0.07)
Basophils Absolute: 0 10*3/uL (ref 0.0–0.1)
Basophils Relative: 0 %
Eosinophils Absolute: 0.1 10*3/uL (ref 0.0–0.5)
Eosinophils Relative: 2 %
HCT: 44.7 % (ref 36.0–46.0)
Hemoglobin: 14.6 g/dL (ref 12.0–15.0)
Immature Granulocytes: 0 %
Lymphocytes Relative: 23 %
Lymphs Abs: 1.6 10*3/uL (ref 0.7–4.0)
MCH: 29 pg (ref 26.0–34.0)
MCHC: 32.7 g/dL (ref 30.0–36.0)
MCV: 88.9 fL (ref 80.0–100.0)
Monocytes Absolute: 0.6 10*3/uL (ref 0.1–1.0)
Monocytes Relative: 9 %
Neutro Abs: 4.4 10*3/uL (ref 1.7–7.7)
Neutrophils Relative %: 66 %
Platelets: 369 10*3/uL (ref 150–400)
RBC: 5.03 MIL/uL (ref 3.87–5.11)
RDW: 12.1 % (ref 11.5–15.5)
WBC: 6.7 10*3/uL (ref 4.0–10.5)
nRBC: 0 % (ref 0.0–0.2)

## 2019-04-16 LAB — COMPREHENSIVE METABOLIC PANEL
ALT: 22 U/L (ref 0–44)
ALT: 24 U/L (ref 0–44)
AST: 19 U/L (ref 15–41)
AST: 21 U/L (ref 15–41)
Albumin: 3.9 g/dL (ref 3.5–5.0)
Albumin: 4.2 g/dL (ref 3.5–5.0)
Alkaline Phosphatase: 62 U/L (ref 38–126)
Alkaline Phosphatase: 68 U/L (ref 38–126)
Anion gap: 14 (ref 5–15)
Anion gap: 16 — ABNORMAL HIGH (ref 5–15)
BUN: 28 mg/dL — ABNORMAL HIGH (ref 6–20)
BUN: 25 mg/dL — ABNORMAL HIGH (ref 6–20)
CO2: 27 mmol/L (ref 22–32)
CO2: 30 mmol/L (ref 22–32)
Calcium: 8.9 mg/dL (ref 8.9–10.3)
Calcium: 9.3 mg/dL (ref 8.9–10.3)
Chloride: 103 mmol/L (ref 98–111)
Chloride: 102 mmol/L (ref 98–111)
Creatinine, Ser: 1.35 mg/dL — ABNORMAL HIGH (ref 0.44–1.00)
Creatinine, Ser: 1.15 mg/dL — ABNORMAL HIGH (ref 0.44–1.00)
GFR calc Af Amer: 49 mL/min — ABNORMAL LOW (ref >60)
GFR calc Af Amer: 60 mL/min — ABNORMAL LOW (ref >60)
GFR calc non Af Amer: 43 mL/min — ABNORMAL LOW (ref >60)
GFR calc non Af Amer: 52 mL/min — ABNORMAL LOW (ref >60)
Glucose, Bld: 91 mg/dL (ref 70–99)
Glucose, Bld: 86 mg/dL (ref 70–99)
Potassium: 2.2 mmol/L — CL (ref 3.5–5.1)
Potassium: 2.5 mmol/L — CL (ref 3.5–5.1)
Sodium: 144 mmol/L (ref 135–145)
Sodium: 148 mmol/L — ABNORMAL HIGH (ref 135–145)
Total Bilirubin: 1.4 mg/dL — ABNORMAL HIGH (ref 0.3–1.2)
Total Bilirubin: 1.2 mg/dL (ref 0.3–1.2)
Total Protein: 6.9 g/dL (ref 6.5–8.1)
Total Protein: 7.8 g/dL (ref 6.5–8.1)

## 2019-04-16 LAB — GLUCOSE, CAPILLARY: Glucose-Capillary: 70 mg/dL (ref 70–99)

## 2019-04-16 LAB — CBC
HCT: 49.5 % — ABNORMAL HIGH (ref 36.0–46.0)
Hemoglobin: 15.9 g/dL — ABNORMAL HIGH (ref 12.0–15.0)
MCH: 28.9 pg (ref 26.0–34.0)
MCHC: 32.1 g/dL (ref 30.0–36.0)
MCV: 89.8 fL (ref 80.0–100.0)
Platelets: 359 10*3/uL (ref 150–400)
RBC: 5.51 MIL/uL — ABNORMAL HIGH (ref 3.87–5.11)
RDW: 12.2 % (ref 11.5–15.5)
WBC: 8.1 10*3/uL (ref 4.0–10.5)
nRBC: 0 % (ref 0.0–0.2)

## 2019-04-16 LAB — MAGNESIUM: Magnesium: 2.3 mg/dL (ref 1.7–2.4)

## 2019-04-16 LAB — RESPIRATORY PANEL BY RT PCR (FLU A&B, COVID)
Influenza A by PCR: NEGATIVE
Influenza B by PCR: NEGATIVE
SARS Coronavirus 2 by RT PCR: NEGATIVE

## 2019-04-16 LAB — ETHANOL: Alcohol, Ethyl (B): <10 mg/dL (ref <10)

## 2019-04-16 MED ORDER — POTASSIUM CHLORIDE IN NACL 20-0.9 MEQ/L-% IV SOLN
INTRAVENOUS | Status: DC
  Filled 2019-04-16 (×3): qty 1000

## 2019-04-16 MED ORDER — HYDRALAZINE HCL 20 MG/ML IJ SOLN
10.0000 mg | Freq: Four times a day (QID) | INTRAMUSCULAR | Status: DC | PRN
  Administered 2019-04-16 – 2019-04-17 (×3): 10 mg via INTRAVENOUS
  Filled 2019-04-16 (×4): qty 1

## 2019-04-16 MED ORDER — POTASSIUM CHLORIDE IN NACL 20-0.9 MEQ/L-% IV SOLN
INTRAVENOUS | Status: DC
  Filled 2019-04-16: qty 1000

## 2019-04-16 MED ORDER — POTASSIUM CHLORIDE 10 MEQ/100ML IV SOLN
INTRAVENOUS | Status: AC
  Administered 2019-04-16: 10 meq via INTRAVENOUS
  Filled 2019-04-16: qty 100

## 2019-04-16 MED ORDER — POTASSIUM CHLORIDE 10 MEQ/100ML IV SOLN
10.0000 meq | INTRAVENOUS | Status: AC
  Administered 2019-04-16 (×3): 10 meq via INTRAVENOUS
  Filled 2019-04-16 (×2): qty 100

## 2019-04-16 MED ORDER — ENOXAPARIN SODIUM 40 MG/0.4ML ~~LOC~~ SOLN
40.0000 mg | Freq: Every day | SUBCUTANEOUS | Status: DC
  Administered 2019-04-16: 40 mg via SUBCUTANEOUS
  Filled 2019-04-16 (×6): qty 0.4

## 2019-04-16 MED ORDER — POTASSIUM CHLORIDE 10 MEQ/100ML IV SOLN
10.0000 meq | INTRAVENOUS | Status: DC
  Administered 2019-04-16 (×2): 10 meq via INTRAVENOUS
  Filled 2019-04-16 (×3): qty 100

## 2019-04-16 MED ORDER — OLANZAPINE 10 MG IM SOLR
10.0000 mg | Freq: Once | INTRAMUSCULAR | Status: AC
  Administered 2019-04-16: 10 mg via INTRAMUSCULAR
  Filled 2019-04-16: qty 10

## 2019-04-16 MED ORDER — ACETAMINOPHEN 325 MG PO TABS
650.0000 mg | ORAL_TABLET | Freq: Four times a day (QID) | ORAL | Status: DC | PRN
  Administered 2019-04-21 – 2019-05-03 (×6): 650 mg via ORAL
  Filled 2019-04-16 (×6): qty 2

## 2019-04-16 MED ORDER — POTASSIUM CHLORIDE 10 MEQ/100ML IV SOLN
10.0000 meq | Freq: Once | INTRAVENOUS | Status: AC
  Administered 2019-04-16: 10 meq via INTRAVENOUS

## 2019-04-16 MED ORDER — ACETAMINOPHEN 650 MG RE SUPP: 650.0000 mg | Freq: Four times a day (QID) | RECTAL | Status: DC | PRN

## 2019-04-16 MED ORDER — POTASSIUM CHLORIDE CRYS ER 20 MEQ PO TBCR: 40.0000 meq | EXTENDED_RELEASE_TABLET | Freq: Once | ORAL | Status: DC

## 2019-04-16 NOTE — ED Notes (Addendum)
Attempted to call husband but unable to get an answer (x2)

## 2019-04-16 NOTE — ED Notes (Signed)
Last run of Potassium completed at 1150. BMET drawn. Pt refused breakfast and is refusing lunch even after much encouragement. Pt also refusing water.

## 2019-04-16 NOTE — ED Notes (Signed)
Date and time results received: 04/16/19 1410 (use smartphrase ".now" to insert current time)  Test: K Critical Value: 2.8  Name of Provider Notified: Toya Smothers, NP  Orders Received? Or Actions Taken?: Waiting for orders

## 2019-04-16 NOTE — ED Notes (Addendum)
ALL belongings - 1 labeled belongings bag and 1 Valuables Envelope Slip - to 6E w/pt. IVC papers also given.

## 2019-04-16 NOTE — H&P (Signed)
TRH H&P    Patient Demographics:    Amy Casey, is a 59 y.o. female  MRN: [id number]  DOB - 03/17/1961  Admit Date - 04/15/2019  Referring MD/NP/PA:  Ivar Drapeob Browning  Outpatient Primary MD for the patient is Patient, No Pcp Per  Patient coming from:  home  Chief complaint-  hypokalemia   HPI:    Amy Casey  is a 59 y.o. female,  w hypertension, depression, schizophrenia, presents for high bp,  Pt states that she was taking amlodipine only sporadically per rn notes.  Pt doesn't want to divulge to me why she came.    Pt was seen by ed, and ivc paper work was filled out.  Pt was noted by er to have significant hypokalemia and ed requests observation admission to correct.   In ED,  Na 144, K 2.2, bun 28, creatinine 1.35 ast 19, alt 2 Wbc 6.7, Hgb 14.6, Plt 369 Magnesium 2.3  etoh <10  covid -19 negative   Pt will be admitted for hypokalemia, and mild aki       Review of systems:    In addition to the HPI above, pt unwilling to cooperate with ros No Fever-chills, No Headache, No changes with Vision or hearing, No problems swallowing food or Liquids, No Chest pain, Cough or Shortness of Breath, No Abdominal pain, No Nausea or Vomiting, bowel movements are regular, No Blood in stool or Urine, No dysuria, No new skin rashes or bruises, No new joints pains-aches,  No new weakness, tingling, numbness in any extremity, No recent weight gain or loss, No polyuria, polydypsia or polyphagia, No significant Mental Stressors.  All other systems reviewed and are negative.    Past History of the following :    Past Medical History:  Diagnosis Date  . Depression    Dr. Mila HomerSena, [facility]; hospitalization 07/2010  . History of echocardiogram 2011   [facility]  . History of renal stone   . Hypertension   . Hypokalemia   . Nephrolithiasis   . Obesity   . Schizophrenia ([facility])   . Wears  glasses       Past Surgical History:  Procedure Laterality Date  . WISDOM TOOTH EXTRACTION        Social History:      Social History   Tobacco Use  . Smoking status: Never Smoker  . Smokeless tobacco: Never Used  Substance Use Topics  . Alcohol use: No       Family History :     Family History  Problem Relation Age of Onset  . Diabetes Mother   . Heart disease Mother   . Chronic Renal Failure Mother   . Cancer Maternal Uncle        lung  . Kidney disease Mother        dialysis  . Stroke Neg Hx   . Hypertension Neg Hx   . Hyperlipidemia Neg Hx       Home Medications:   Prior to Admission medications   Medication Sig Start Date End  Date Taking? Authorizing Provider  amLODipine (NORVASC) 5 MG tablet Take 5 mg by mouth daily. 02/25/19  Yes [provider]  FLUoxetine (PROZAC) 40 MG capsule Take 40 mg by mouth 2 (two) times daily.   Yes [provider]  ibuprofen (ADVIL) 200 MG tablet Take 200-400 mg by mouth every 6 (six) hours as needed for headache (pain).   Yes [provider]  OLANZapine (ZYPREXA) 20 MG tablet Take 1 tablet (20 mg total) by mouth at bedtime. 08/20/18  Yes Clapacs, Jackquline Denmark, MD  amLODipine (NORVASC) 10 MG tablet Take 1 tablet (10 mg total) by mouth daily. Patient not taking: Reported on 04/15/2019 08/21/18   Clapacs, Jackquline Denmark, MD  amLODipine (NORVASC) 10 MG tablet Take 1 tablet (10 mg total) by mouth daily. Patient not taking: Reported on 04/15/2019 11/13/18   Dimple Nanas, MD  docusate sodium (COLACE) 100 MG capsule Take 2 capsules (200 mg total) by mouth daily. Patient not taking: Reported on 04/15/2019 08/20/18   Clapacs, Jackquline Denmark, MD  FLUoxetine (PROZAC) 20 MG capsule Take 3 capsules (60 mg total) by mouth daily. Patient not taking: Reported on 04/15/2019 08/21/18   Clapacs, Jackquline Denmark, MD  OLANZapine (ZYPREXA) 10 MG tablet Take 1 tablet (10 mg total) by mouth at bedtime. Patient not taking: Reported on 04/15/2019 11/12/18    Dimple Nanas, MD     Allergies:     Allergies  Allergen Reactions  . Paliperidone Other (See Comments)    Angioedema, drooling, slurred speech, ptosis  . Lisinopril Rash and Other (See Comments)    Angioedema   . Sulfa Antibiotics Itching     Physical Exam:   Vitals  Blood pressure (!) 188/104, pulse 76, temperature 98.1 F (36.7 C), temperature source Oral, resp. rate 18, SpO2 99 %.  1.  General: axoxo1 (person, pt doesn't want to answer place or time)  2. Psychiatric: Slightly agitated  3. Neurologic: Nonfocal, cn2-12 intact, reflexes 2+ symmetric, diffuse with no clonus, motor 5/5 in all 4 ext  4. HEENMT:  Anicteric, pupils 1.26mm symmetric, direct, consensual intact Neck; no jvd  5. Respiratory : ctab  6. Cardiovascular : rrr s1, s2, no m/g/r  7. Gastrointestinal:  Abd; soft, nt, nd, +bs  8. Skin:  Ext: no c/c/e, no rash  9.Musculoskeletal:  Good rom    Data Review:    CBC Recent Labs  Lab 04/16/19 0219  WBC 6.7  HGB 14.6  HCT 44.7  PLT 369  MCV 88.9  MCH 29.0  MCHC 32.7  RDW 12.1  LYMPHSABS 1.6  MONOABS 0.6  EOSABS 0.1  BASOSABS 0.0   ------------------------------------------------------------------------------------------------------------------  Results for orders placed or performed during the hospital encounter of 04/15/19 (from the past 48 hour(s))  Respiratory Panel by RT PCR (Flu A&B, Covid) - Nasopharyngeal Swab     Status: None   Collection Time: 04/16/19  2:11 AM   Specimen: Nasopharyngeal Swab  Result Value Ref Range   SARS Coronavirus 2 by RT PCR NEGATIVE NEGATIVE    Comment: (NOTE) SARS-CoV-2 target nucleic acids are NOT DETECTED. The SARS-CoV-2 RNA is generally detectable in upper respiratoy specimens during the acute phase of infection. The lowest concentration of SARS-CoV-2 viral copies this assay can detect is 131 copies/mL. A negative result does not preclude SARS-Cov-2 infection and should not be used  as the sole basis for treatment or other patient management decisions. A negative result may occur with  improper specimen collection/handling, submission of specimen other than nasopharyngeal  swab, presence of viral mutation(s) within the areas targeted by this assay, and inadequate number of viral copies (<131 copies/mL). A negative result must be combined with clinical observations, patient history, and epidemiological information. The expected result is Negative. Fact Sheet for Patients:  https://www.moore.com/ Fact Sheet for Healthcare Providers:  https://www.young.biz/ This test is not yet ap proved or cleared by the [place] FDA and  has been authorized for detection and/or diagnosis of SARS-CoV-2 by FDA under an Emergency Use Authorization (EUA). This EUA will remain  in effect (meaning this test can be used) for the duration of the COVID-19 declaration under Section 564(b)(1) of the Act, 21 U.S.C. section 360bbb-3(b)(1), unless the authorization is terminated or revoked sooner.    Influenza A by PCR NEGATIVE NEGATIVE   Influenza B by PCR NEGATIVE NEGATIVE    Comment: (NOTE) The Xpert Xpress SARS-CoV-2/FLU/RSV assay is intended as an aid in  the diagnosis of influenza from Nasopharyngeal swab specimens and  should not be used as a sole basis for treatment. Nasal washings and  aspirates are unacceptable for Xpert Xpress SARS-CoV-2/FLU/RSV  testing. Fact Sheet for Patients: https://www.moore.com/ Fact Sheet for Healthcare Providers: https://www.young.biz/ This test is not yet approved or cleared by the [place] FDA and  has been authorized for detection and/or diagnosis of SARS-CoV-2 by  FDA under an Emergency Use Authorization (EUA). This EUA will remain  in effect (meaning this test can be used) for the duration of the  Covid-19 declaration under Section 564(b)(1) of the Act, 21  U.S.C.  section 360bbb-3(b)(1), unless the authorization is  terminated or revoked. Performed at [facility], [address] [place], [place] [address]   Comprehensive metabolic panel     Status: Abnormal   Collection Time: 04/16/19  2:19 AM  Result Value Ref Range   Sodium 144 135 - 145 mmol/L   Potassium 2.2 (LL) 3.5 - 5.1 mmol/L    Comment: CRITICAL RESULT CALLED TO, READ BACK BY AND VERIFIED WITH: BARWICK J,RN 04/16/19 0256 WAYK    Chloride 103 98 - 111 mmol/L   CO2 27 22 - 32 mmol/L   Glucose, Bld 91 70 - 99 mg/dL   BUN 28 (H) 6 - 20 mg/dL   Creatinine, Ser 8.34 (H) 0.44 - 1.00 mg/dL   Calcium 8.9 8.9 - 19.6 mg/dL   Total Protein 6.9 6.5 - 8.1 g/dL   Albumin 3.9 3.5 - 5.0 g/dL   AST 19 15 - 41 U/L   ALT 22 0 - 44 U/L   Alkaline Phosphatase 62 38 - 126 U/L   Total Bilirubin 1.4 (H) 0.3 - 1.2 mg/dL   GFR calc non Af Amer 43 (L) >60 mL/min   GFR calc Af Amer 49 (L) >60 mL/min   Anion gap 14 5 - 15    Comment: Performed at [facility], [address] [place], [place] [address]  Ethanol     Status: None   Collection Time: 04/16/19  2:19 AM  Result Value Ref Range   Alcohol, Ethyl (B) <10 <10 mg/dL    Comment: (NOTE) Lowest detectable limit for serum alcohol is 10 mg/dL. For medical purposes only. Performed at [facility], [address], [place], [place] [address]   CBC with Diff     Status: None   Collection Time: 04/16/19  2:19 AM  Result Value Ref Range   WBC 6.7 4.0 - 10.5 K/uL   RBC 5.03 3.87 - 5.11 MIL/uL  Hemoglobin 14.6 12.0 - 15.0 g/dL   HCT 44.7 36.0 - 46.0 %   MCV 88.9 80.0 - 100.0 fL   MCH 29.0 26.0 - 34.0 pg   MCHC 32.7 30.0 - 36.0 g/dL   RDW 12.1 11.5 - 15.5 %   Platelets 369 150 - 400 K/uL   nRBC 0.0 0.0 - 0.2 %   Neutrophils Relative % 66 %   Neutro Abs 4.4 1.7 - 7.7 K/uL   Lymphocytes Relative 23 %   Lymphs Abs 1.6 0.7 - 4.0 K/uL   Monocytes Relative 9 %   Monocytes Absolute 0.6 0.1 - 1.0 K/uL   Eosinophils Relative 2 %    Eosinophils Absolute 0.1 0.0 - 0.5 K/uL   Basophils Relative 0 %   Basophils Absolute 0.0 0.0 - 0.1 K/uL   Immature Granulocytes 0 %   Abs Immature Granulocytes 0.02 0.00 - 0.07 K/uL    Comment: Performed at [facility] [address] [place], [place] [address]  Magnesium     Status: None   Collection Time: 04/16/19  2:59 AM  Result Value Ref Range   Magnesium 2.3 1.7 - 2.4 mg/dL    Comment: Performed at [facility] [address] [place], [place] [address]    Chemistries  Recent Labs  Lab 04/16/19 [phone] 04/16/19 0259  NA 144  --   K 2.2*  --   CL 103  --   CO2 27  --   GLUCOSE 91  --   BUN 28*  --   CREATININE 1.35*  --   CALCIUM 8.9  --   MG  --  2.3  AST 19  --   ALT 22  --   ALKPHOS 62  --   BILITOT 1.4*  --    ------------------------------------------------------------------------------------------------------------------  ------------------------------------------------------------------------------------------------------------------ GFR: CrCl cannot be calculated (Unknown ideal weight.). Liver Function Tests: Recent Labs  Lab 04/16/19 0219  AST 19  ALT 22  ALKPHOS 62  BILITOT 1.4*  PROT 6.9  ALBUMIN 3.9   No results for input(s): LIPASE, AMYLASE in the last 168 hours. No results for input(s): AMMONIA in the last 168 hours. Coagulation Profile: No results for input(s): INR, PROTIME in the last 168 hours. Cardiac Enzymes: No results for input(s): CKTOTAL, CKMB, CKMBINDEX, TROPONINI in the last 168 hours. BNP (last 3 results) No results for input(s): PROBNP in the last 8760 hours. HbA1C: No results for input(s): HGBA1C in the last 72 hours. CBG: No results for input(s): GLUCAP in the last 168 hours. Lipid Profile: No results for input(s): CHOL, HDL, LDLCALC, TRIG, CHOLHDL, LDLDIRECT in the last 72 hours. Thyroid Function Tests: No results for input(s): TSH, T4TOTAL, FREET4, T3FREE, THYROIDAB in the last 72 hours. Anemia Panel: No  results for input(s): VITAMINB12, FOLATE, FERRITIN, TIBC, IRON, RETICCTPCT in the last 72 hours.  --------------------------------------------------------------------------------------------------------------- Urine analysis:    Component Value Date/Time   COLORURINE AMBER (A) 07/30/2018 1117   APPEARANCEUR HAZY (A) 07/30/2018 1117   LABSPEC 1.015 08/21/2018 1154   PHURINE 5.0 07/30/2018 1117   GLUCOSEU NEGATIVE 07/30/2018 1117   [place] 07/30/2018 1117   BILIRUBINUR negative 08/21/2018 1154   BILIRUBINUR n 06/02/2016 1251   KETONESUR negative 08/21/2018 1154   KETONESUR 5 (A) 07/30/2018 1117   PROTEINUR trace (A) 08/21/2018 1154   PROTEINUR 30 (A) 07/30/2018 1117   UROBILINOGEN negative 06/02/2016 1251   UROBILINOGEN 1.0 02/19/2014 1849   NITRITE Negative 08/21/2018 1154   NITRITE NEGATIVE 07/30/2018 1117  LEUKOCYTESUR Negative 08/21/2018 1154   LEUKOCYTESUR LARGE (A) 07/30/2018 1117      Imaging Results:    No results found.   ekg nsr at 70 , nl axis, early r progression, no st-t changes c/w ischemia   Assessment & Plan:    Principal Problem:   Hypokalemia Active Problems:   Essential hypertension, benign   AKI (acute kidney injury) (HCC)  Hypokalemia Replete Check cmp at 1100  Hypertension uncontrolled Cont amlodipine Hydralazine 10mg  iv q6h prn sbp >160  AKI Hydrate gently with ns + 20 meq kcl  Check cmp in am  Schizophrenia/ depression Cont zyprexa Cont prozac Psychiatry consult placed in computer   DVT Prophylaxis-   Lovenox - SCDs   AM Labs Ordered, also please review Full Orders  Family Communication: Admission, patients condition and plan of care including tests being ordered have been discussed with the patient  who indicate understanding and agree with the plan and Code Status.  Code Status:  Full code per patient, notified spouse that patient admitted to [facility]  Admission status: Observation: Based on patients clinical presentation  and evaluation of above clinical data, I have made determination that patient meets Observation criteria at this time.    Time spent in minutes : 55 minutes   [facility] M.D on 04/16/2019 at 4:51 AM

## 2019-04-16 NOTE — ED Notes (Signed)
Registration brings back a phone charger and was unsure if it's [employer]. The phone charger placed on [employer] secretary's desk.

## 2019-04-16 NOTE — Progress Notes (Signed)
[facility] PROGRESS NOTE  Amy Casey PPJ:[id number] DOB: 11/30/1960 DOA: 04/15/2019 PCP: Patient, No Pcp Per  Assessment/Plan:  #1.  Hypokalemia.  Likely related to decreased oral intake/dehydration.  Chart review indicates patient has been non compliant with medications for at least a month.  She continues to refuse anything by mouth while in the emergency department.  Magnesium level within the limits of normal.  She is being provided with 6 runs of IV potassium. EKG without acute changes. -continue IV potassium until cooperative with oral -Gentle IV fluids -bmet this afternoon -encourage po intake  #2.  Acute encephalopathy/schizophrenia/depression.  Patient has been out of medications for a month.  Chart review indicates family reports patient has declined food and water for the last 3 days.  Evaluated by psychiatry who recommended overnight observation for safety and stability plan to reassess this a.m.  Of note IVC paperwork initiated in the emergency department.  Currently she is calm but uncooperative and noncommunicative.  Appreciate psychiatry assistance -Continue home meds as able  #3.  Acute kidney injury.  Creatinine 1.35 on admission.  Likely related to dehydration in the setting of no oral intake. -Gentle IV fluids -Hold nephrotoxins -Encourage p.o. intake -Recheck in the morning if patient allows  #4.  Hypertension.  Blood pressure quite elevated on presentation.  Likely related to noncompliance with medications over the last several weeks.  Chart review indicates she stated that she did take her Norvasc however I submit information from patient's unreliable due to #2.  Home medications include amlodipine -Continue amlodipine when patient cooperative with orals -As needed hydralazine -Monitor   Code Status: full Family Communication: husband on phone Disposition Plan: to be  determined   Consultants:  BH  Procedures:    Antibiotics:    HPI/Subjective: Awake alert calm but not cooperative.  Noncommunicative.  She will follow simple commands but only mumbles incoherently when asked a question.  Denies pain  Objective: Vitals:   04/16/19 0630 04/16/19 0645  BP: (!) 167/90 (!) 155/82  Pulse: 87 92  Resp: 18 (!) 22  Temp:    SpO2: 99% 99%    Intake/Output Summary (Last 24 hours) at 04/16/2019 1011 Last data filed at 04/16/2019 0800 Gross per 24 hour  Intake 0 ml  Output --  Net 0 ml   There were no vitals filed for this visit.  Exam:   General: Awake alert well-nourished no acute distress  Cardiovascular: Tachycardic but regular I hear no murmur gallop or rub no lower extremity edema  Respiratory: Normal effort breath sounds are clear bilaterally I hear no rhonchi no crackles  Abdomen: Soft nondistended positive bowel sounds but sluggish nontender to palpation no guarding or rebounding  Musculoskeletal: Weights without swelling/erythema moves all extremities spontaneously  Data Reviewed: Basic Metabolic Panel: Recent Labs  Lab 04/16/19 0219 04/16/19 0259 04/16/19 0630  NA 144  --  148*  K 2.2*  --  2.5*  CL 103  --  102  CO2 27  --  30  GLUCOSE 91  --  86  BUN 28*  --  25*  CREATININE 1.35*  --  1.15*  CALCIUM 8.9  --  9.3  MG  --  2.3  --    Liver Function Tests: Recent Labs  Lab 04/16/19 0219 04/16/19 0630  AST 19 21  ALT 22 24  ALKPHOS 62 68  BILITOT 1.4* 1.2  PROT 6.9 7.8  ALBUMIN 3.9 4.2   No results for input(s): LIPASE, AMYLASE in the last  168 hours. No results for input(s): AMMONIA in the last 168 hours. CBC: Recent Labs  Lab 04/16/19 0219 04/16/19 0630  WBC 6.7 8.1  NEUTROABS 4.4  --   HGB 14.6 15.9*  HCT 44.7 49.5*  MCV 88.9 89.8  PLT 369 359   Cardiac Enzymes: No results for input(s): CKTOTAL, CKMB, CKMBINDEX, TROPONINI in the last 168 hours. BNP (last 3 results) No results for input(s):  BNP in the last 8760 hours.  ProBNP (last 3 results) No results for input(s): PROBNP in the last 8760 hours.  CBG: No results for input(s): GLUCAP in the last 168 hours.  Recent Results (from the past 240 hour(s))  Respiratory Panel by RT PCR (Flu A&B, Covid) - Nasopharyngeal Swab     Status: None   Collection Time: 04/16/19  2:11 AM   Specimen: Nasopharyngeal Swab  Result Value Ref Range Status   SARS Coronavirus 2 by RT PCR NEGATIVE NEGATIVE Final    Comment: (NOTE) SARS-CoV-2 target nucleic acids are NOT DETECTED. The SARS-CoV-2 RNA is generally detectable in upper respiratoy specimens during the acute phase of infection. The lowest concentration of SARS-CoV-2 viral copies this assay can detect is 131 copies/mL. A negative result does not preclude SARS-Cov-2 infection and should not be used as the sole basis for treatment or other patient management decisions. A negative result may occur with  improper specimen collection/handling, submission of specimen other than nasopharyngeal swab, presence of viral mutation(s) within the areas targeted by this assay, and inadequate number of viral copies (<131 copies/mL). A negative result must be combined with clinical observations, patient history, and epidemiological information. The expected result is Negative. Fact Sheet for Patients:  PinkCheek.be Fact Sheet for Healthcare Providers:  GravelBags.it This test is not yet ap proved or cleared by the [place] FDA and  has been authorized for detection and/or diagnosis of SARS-CoV-2 by FDA under an Emergency Use Authorization (EUA). This EUA will remain  in effect (meaning this test can be used) for the duration of the COVID-19 declaration under Section 564(b)(1) of the Act, 21 U.S.C. section 360bbb-3(b)(1), unless the authorization is terminated or revoked sooner.    Influenza A by PCR NEGATIVE NEGATIVE Final   Influenza  B by PCR NEGATIVE NEGATIVE Final    Comment: (NOTE) The Xpert Xpress SARS-CoV-2/FLU/RSV assay is intended as an aid in  the diagnosis of influenza from Nasopharyngeal swab specimens and  should not be used as a sole basis for treatment. Nasal washings and  aspirates are unacceptable for Xpert Xpress SARS-CoV-2/FLU/RSV  testing. Fact Sheet for Patients: PinkCheek.be Fact Sheet for Healthcare Providers: GravelBags.it This test is not yet approved or cleared by the [place] FDA and  has been authorized for detection and/or diagnosis of SARS-CoV-2 by  FDA under an Emergency Use Authorization (EUA). This EUA will remain  in effect (meaning this test can be used) for the duration of the  Covid-19 declaration under Section 564(b)(1) of the Act, 21  U.S.C. section 360bbb-3(b)(1), unless the authorization is  terminated or revoked. Performed at [facility], [place] [address] [place], [place] [address]      Studies: No results found.  Scheduled Meds: . amLODipine  5 mg Oral Daily  . enoxaparin (LOVENOX) injection  40 mg Subcutaneous Daily  . FLUoxetine  40 mg Oral BID  . OLANZapine  20 mg Oral QHS  . potassium chloride  40 mEq Oral Once   Continuous Infusions: . 0.9 % NaCl with KCl 20 mEq /  L      Principal Problem:   Hypokalemia Active Problems:   Essential hypertension, benign   AKI (acute kidney injury) (HCC)   Involuntary commitment   Schizophrenia (HCC)   Encephalopathy   Noncompliance    Time spent: 45 min    Toya Smothers NP [facility]  If 7PM-7AM, please contact night-coverage at [email or web address], password [facility] 04/16/2019, 10:11 AM  LOS: 0 days

## 2019-04-16 NOTE — ED Provider Notes (Signed)
Medically clear with labs, psych to reassess in AM.  K is 2.2.  Cr. 1.35.  Patient poor historian for me and somewhat sedated still from zyprexa IM given earlier.    Will need admission for profound hypokalemia.  Psych had planned to reassess patient in the morning.    Currently under IVC.  Appreciate Dr. Selena Batten for admitting.   Roxy Horseman, PA-C 04/16/19 0448    Nira Conn, MD 04/16/19 [phone]

## 2019-04-16 NOTE — ED Notes (Signed)
Lunch Tray Ordered @ 1059.  

## 2019-04-16 NOTE — ED Notes (Signed)
Pt nonverbal, not answering questions.

## 2019-04-16 NOTE — ED Notes (Signed)
Pt lying on bed - closes eyes at times - Refused to answer questions from this RN and [facility] NP.

## 2019-04-16 NOTE — ED Notes (Signed)
Pt assisted to restroom. Purple scrubs changed and linens changed. Pt uncooroperative and refused to leave bathroom and return to room. Pt has now returned to room.

## 2019-04-16 NOTE — ED Notes (Signed)
Pt's belongings inventoried and put into locker #3. Pt was medicated, asleep and unable to sign the inventory sheet, witnessed by Zebedee Iba, RN.

## 2019-04-16 NOTE — ED Notes (Signed)
Pt ambulated to bathroom - Sitter w/pt. Pt refused to give urine specimen. Pt noted w/urinary incont. Pt initially refused for staff to assist w/changing her pants after refusing to get up from toilet (sat on toilet x 15 min) - pt then cooperative after additional staff members arrived. Pt ambulated back to room. Pt noted w/excessive soft speech and mumbles. Continues to refuse to answer questions from staff and refusing po fluids/food/meds. Pt had also pulled off monitor leads - placed back on pt after much encouragement to allow staff to do so.

## 2019-04-16 NOTE — ED Notes (Signed)
Holding NS w/ KCL infusion until 3 runs of potassium complete per pharmacy recommendation.

## 2019-04-17 DIAGNOSIS — F209 Schizophrenia, unspecified: Secondary | ICD-10-CM

## 2019-04-17 LAB — RAPID URINE DRUG SCREEN, HOSP PERFORMED
Amphetamines: NOT DETECTED
Barbiturates: NOT DETECTED
Benzodiazepines: NOT DETECTED
Cocaine: NOT DETECTED
Opiates: NOT DETECTED
Tetrahydrocannabinol: NOT DETECTED

## 2019-04-17 LAB — URINALYSIS, ROUTINE W REFLEX MICROSCOPIC
Bilirubin Urine: NEGATIVE
Glucose, UA: NEGATIVE mg/dL
Ketones, ur: 80 mg/dL — AB
Nitrite: NEGATIVE
Protein, ur: NEGATIVE mg/dL
Specific Gravity, Urine: 1.021 (ref 1.005–1.030)
pH: 5 (ref 5.0–8.0)

## 2019-04-17 LAB — BASIC METABOLIC PANEL
Anion gap: 13 (ref 5–15)
Anion gap: 16 — ABNORMAL HIGH (ref 5–15)
BUN: 23 mg/dL — ABNORMAL HIGH (ref 6–20)
BUN: 21 mg/dL — ABNORMAL HIGH (ref 6–20)
CO2: 24 mmol/L (ref 22–32)
CO2: 22 mmol/L (ref 22–32)
Calcium: 8.5 mg/dL — ABNORMAL LOW (ref 8.9–10.3)
Calcium: 9 mg/dL (ref 8.9–10.3)
Chloride: 107 mmol/L (ref 98–111)
Chloride: 108 mmol/L (ref 98–111)
Creatinine, Ser: 1.07 mg/dL — ABNORMAL HIGH (ref 0.44–1.00)
Creatinine, Ser: 1.08 mg/dL — ABNORMAL HIGH (ref 0.44–1.00)
GFR calc Af Amer: >60 mL/min (ref >60)
GFR calc Af Amer: >60 mL/min (ref >60)
GFR calc non Af Amer: 56 mL/min — ABNORMAL LOW (ref >60)
GFR calc non Af Amer: 56 mL/min — ABNORMAL LOW (ref >60)
Glucose, Bld: 76 mg/dL (ref 70–99)
Glucose, Bld: 73 mg/dL (ref 70–99)
Potassium: 2.7 mmol/L — CL (ref 3.5–5.1)
Potassium: 3.6 mmol/L (ref 3.5–5.1)
Sodium: 144 mmol/L (ref 135–145)
Sodium: 146 mmol/L — ABNORMAL HIGH (ref 135–145)

## 2019-04-17 LAB — GLUCOSE, CAPILLARY
Glucose-Capillary: 68 mg/dL — ABNORMAL LOW (ref 70–99)
Glucose-Capillary: 75 mg/dL (ref 70–99)
Glucose-Capillary: 70 mg/dL (ref 70–99)
Glucose-Capillary: 58 mg/dL — ABNORMAL LOW (ref 70–99)
Glucose-Capillary: 99 mg/dL (ref 70–99)

## 2019-04-17 LAB — MAGNESIUM: Magnesium: 2.1 mg/dL (ref 1.7–2.4)

## 2019-04-17 MED ORDER — FLUOXETINE HCL 20 MG PO CAPS
20.0000 mg | ORAL_CAPSULE | Freq: Every day | ORAL | Status: DC
  Administered 2019-04-18 – 2019-04-19 (×2): 20 mg via ORAL
  Filled 2019-04-17 (×6): qty 1

## 2019-04-17 MED ORDER — POTASSIUM CHLORIDE 10 MEQ/100ML IV SOLN
10.0000 meq | INTRAVENOUS | Status: AC
  Administered 2019-04-17 (×6): 10 meq via INTRAVENOUS
  Filled 2019-04-17 (×6): qty 100

## 2019-04-17 MED ORDER — KCL IN DEXTROSE-NACL 20-5-0.9 MEQ/L-%-% IV SOLN
INTRAVENOUS | Status: DC
  Filled 2019-04-17 (×3): qty 1000

## 2019-04-17 MED ORDER — DEXTROSE 50 % IV SOLN: 25.0000 mL | INTRAVENOUS | Status: DC | PRN

## 2019-04-17 MED ORDER — METOPROLOL TARTRATE 5 MG/5ML IV SOLN
5.0000 mg | Freq: Four times a day (QID) | INTRAVENOUS | Status: DC
  Administered 2019-04-17 – 2019-04-18 (×4): 5 mg via INTRAVENOUS
  Filled 2019-04-17 (×4): qty 5

## 2019-04-17 MED ORDER — OLANZAPINE 10 MG IM SOLR
10.0000 mg | Freq: Two times a day (BID) | INTRAMUSCULAR | Status: DC
  Filled 2019-04-17: qty 10

## 2019-04-17 MED ORDER — OLANZAPINE 5 MG PO TBDP
10.0000 mg | ORAL_TABLET | Freq: Two times a day (BID) | ORAL | Status: DC
  Administered 2019-04-17 – 2019-04-23 (×5): 10 mg via ORAL
  Filled 2019-04-17 (×14): qty 2

## 2019-04-17 NOTE — Progress Notes (Addendum)
[facility] PROGRESS NOTE  Amy Casey [id number]:[id number] DOB: 02-16-1961 DOA: 04/15/2019 PCP: Patient, No Pcp Per  Amy Casey is a 59 year old female with history of hypertension, depression, and schizophrenia.  She has been off her medications for her psych disorder as well as her hypertension.  Family brought her to [facility] where she was found to have a high blood pressure.  She was then referred to the ER.  In the ER IVC paperwork was filled out and psychiatry consulted.  Patient was also found to have a low potassium and has been refusing anything by mouth.   Assessment/Plan:  #1.  Hypokalemia.  Likely related to decreased oral intake/dehydration.  Chart review indicates patient has been non compliant with medications for at least a month.  She continues to refuse medications by mouth, she did have some pudding this morning.  Magnesium level within the limits of normal.  She is getting 6 runs of potassium again-I have asked the nurse to check her BMP after the potassium finishes Continue IV potassium until cooperative with oral -encourage po intake  #2.  Acute encephalopathy/schizophrenia/depression.  Patient has been out of medications for a month.  Chart review indicates family reports patient has declined food and water for the last 3 days.  Evaluated by psychiatry who recommended overnight observation for safety and stability plan to reassess this a.m.  Of note IVC paperwork initiated in the emergency department.  Currently she is calm but uncooperative and noncommunicative.  Appreciate psychiatry assistance -Now that patient is out of the ER will reconsult psychiatry  #3.  Acute kidney injury.  Creatinine 1.35 on admission.  Likely related to dehydration in the setting of no oral intake. -Gentle IV fluids -Improving  #4.  Hypertension.  Blood pressure quite elevated on presentation.  Likely related to noncompliance with medications over the last several weeks.  Chart review  indicates she stated that she did take her Norvasc however I submit information from patient's unreliable due to #2.  Home medications include amlodipine -Continue amlodipine when patient cooperative with orals -As needed hydralazine -Monitor   Addendum: IT is felt that w/o benefit of psych medications the patient's condition will continue to deteriorate.  Medications ordered and will continue to follow up the recommendations of my psychiatry colleagues. Will monitor patient closely for response.   Code Status: full Family Communication: husband on phone 1/19 Disposition Plan: to be determined   Consultants:  BH    HPI/Subjective: Patient refusing medications but did have some pudding this morning for a low blood sugar  Objective: Vitals:   04/17/19 0315 04/17/19 0546  BP: (!) 149/86 (!) 166/95  Pulse: 87 99  Resp:  16  Temp: 99.3 F (37.4 C) 97.9 F (36.6 C)  SpO2: 98% 98%    Intake/Output Summary (Last 24 hours) at 04/17/2019 0834 Last data filed at 04/16/2019 1700 Gross per 24 hour  Intake 151.7 ml  Output --  Net 151.7 ml   There were no vitals filed for this visit.  Exam:   In bed, no acute distress  Refusing medications  Awake but not conversant nor interactive other to decline medications  Data Reviewed: Basic Metabolic Panel: Recent Labs  Lab 04/16/19 0219 04/16/19 0259 04/16/19 0630 04/16/19 1300 04/16/19 1842 04/17/19 0251  NA 144  --  148* 144 146* 144  K 2.2*  --  2.5* 2.8* 3.3* 2.7*  CL 103  --  102 105 106 107  CO2 27  --  30 25 26  24  GLUCOSE 91  --  86 85 70 76  BUN 28*  --  25* 21* 20 23*  CREATININE 1.35*  --  1.15* 0.86 0.91 1.07*  CALCIUM 8.9  --  9.3 8.8* 9.1 8.5*  MG  --  2.3  --   --   --  2.1   Liver Function Tests: Recent Labs  Lab 04/16/19 0219 04/16/19 0630  AST 19 21  ALT 22 24  ALKPHOS 62 68  BILITOT 1.4* 1.2  PROT 6.9 7.8  ALBUMIN 3.9 4.2   No results for input(s): LIPASE, AMYLASE in the last 168  hours. No results for input(s): AMMONIA in the last 168 hours. CBC: Recent Labs  Lab 04/16/19 0219 04/16/19 0630  WBC 6.7 8.1  NEUTROABS 4.4  --   HGB 14.6 15.9*  HCT 44.7 49.5*  MCV 88.9 89.8  PLT 369 359   Cardiac Enzymes: No results for input(s): CKTOTAL, CKMB, CKMBINDEX, TROPONINI in the last 168 hours. BNP (last 3 results) No results for input(s): BNP in the last 8760 hours.  ProBNP (last 3 results) No results for input(s): PROBNP in the last 8760 hours.  CBG: Recent Labs  Lab 04/16/19 1508 04/17/19 0742  GLUCAP 70 68*    Recent Results (from the past 240 hour(s))  Respiratory Panel by RT PCR (Flu A&B, Covid) - Nasopharyngeal Swab     Status: None   Collection Time: 04/16/19  2:11 AM   Specimen: Nasopharyngeal Swab  Result Value Ref Range Status   SARS Coronavirus 2 by RT PCR NEGATIVE NEGATIVE Final    Comment: (NOTE) SARS-CoV-2 target nucleic acids are NOT DETECTED. The SARS-CoV-2 RNA is generally detectable in upper respiratoy specimens during the acute phase of infection. The lowest concentration of SARS-CoV-2 viral copies this assay can detect is 131 copies/mL. A negative result does not preclude SARS-Cov-2 infection and should not be used as the sole basis for treatment or other patient management decisions. A negative result may occur with  improper specimen collection/handling, submission of specimen other than nasopharyngeal swab, presence of viral mutation(s) within the areas targeted by this assay, and inadequate number of viral copies (<131 copies/mL). A negative result must be combined with clinical observations, patient history, and epidemiological information. The expected result is Negative. Fact Sheet for Patients:  PinkCheek.be Fact Sheet for Healthcare Providers:  GravelBags.it This test is not yet ap proved or cleared by the [place] FDA and  has been authorized for detection  and/or diagnosis of SARS-CoV-2 by FDA under an Emergency Use Authorization (EUA). This EUA will remain  in effect (meaning this test can be used) for the duration of the COVID-19 declaration under Section 564(b)(1) of the Act, 21 U.S.C. section 360bbb-3(b)(1), unless the authorization is terminated or revoked sooner.    Influenza A by PCR NEGATIVE NEGATIVE Final   Influenza B by PCR NEGATIVE NEGATIVE Final    Comment: (NOTE) The Xpert Xpress SARS-CoV-2/FLU/RSV assay is intended as an aid in  the diagnosis of influenza from Nasopharyngeal swab specimens and  should not be used as a sole basis for treatment. Nasal washings and  aspirates are unacceptable for Xpert Xpress SARS-CoV-2/FLU/RSV  testing. Fact Sheet for Patients: PinkCheek.be Fact Sheet for Healthcare Providers: GravelBags.it This test is not yet approved or cleared by the [place] FDA and  has been authorized for detection and/or diagnosis of SARS-CoV-2 by  FDA under an Emergency Use Authorization (EUA). This EUA will remain  in effect (meaning this test can be  used) for the duration of the  Covid-19 declaration under Section 564(b)(1) of the Act, 21  U.S.C. section 360bbb-3(b)(1), unless the authorization is  terminated or revoked. Performed at [facility], [address], [place], [place] [address]      Studies: No results found.  Scheduled Meds: . amLODipine  5 mg Oral Daily  . enoxaparin (LOVENOX) injection  40 mg Subcutaneous Daily  . FLUoxetine  40 mg Oral BID  . OLANZapine  20 mg Oral QHS   Continuous Infusions: . 0.9 % NaCl with KCl 20 mEq / L 50 mL/hr at 04/16/19 1426  . potassium chloride 10 mEq (04/17/19 0733)    Principal Problem:   Hypokalemia Active Problems:   Essential hypertension, benign   Noncompliance   Involuntary commitment   Schizophrenia (HCC)   AKI (acute kidney injury) (HCC)   Encephalopathy    Time spent:  45 min    Toya Smothers NP [facility]  If 7PM-7AM, please contact night-coverage at [email or web address], password [facility] 04/17/2019, 8:34 AM  LOS: 1 day

## 2019-04-17 NOTE — Consult Note (Addendum)
[facility] Face-to-Face Psychiatry Consult   Reason for Consult:  Schizophrenia Referring Physician:  Hospitalist Patient Identification: Amy Casey MRN:  [id number] Principal Diagnosis: Hypokalemia Diagnosis:  Principal Problem:   Hypokalemia Active Problems:   Essential hypertension, benign   Noncompliance   Involuntary commitment   Schizophrenia (HCC)   AKI (acute kidney injury) (HCC)   Encephalopathy   Total Time spent with patient: 45 minutes  Subjective:   Amy Casey is a 59 y.o. female patient is able to speak softly and states that she remember being treated at [facility] and that medication helps, but she does not want to take any. She reports she doesn't want any pills or injections. She states that she has not had any hallucinations today and denies any suicidal or homicidal ideations. She has trouble answering some questions. She does state yes to auditory hallucinations yesterday and reports no recent suicidal ideations but unable to say when last suicidal thought was. She continues to agree that psychotropic medications assist in her going home sooner and stabilizing but then continues to refuse medications.   Patient is seen by this provider face-to-face today. She speaks very softly and has to asked numerous times to speak up and she does increase speech volume only for individual words. She presents with constricted affect and appears depressed. She states understanding of the need for medications but continues to refuse the medications. The patient appears paranoid about the medications, but does not say why. I feel that once this patient is medically clear and is able to eat and care for herself then she would benefit from psychiatric inpatient hospitalization. I feel that this patient will need forced medications and have requested Dr. Jama Flavors to evaluate the patient for forced medications. I have  Also notified Dr. Benjamine Mola of the recommendations and asked for her to be the second  physician for the forced medication order and she has agreed.   HPI:  PER Dr. Selena Batten: 59 y.o. female,  w hypertension, depression, schizophrenia, presents for high bp,  Pt states that she was taking amlodipine only sporadically per rn notes.  Pt doesn't want to divulge to me why she came.   Pt was seen by ed, and ivc paper work was filled out.  Pt was noted by er to have significant hypokalemia and ed requests observation admission to correct.   Past Psychiatric History: Patient has a history of recurrent psychiatric illness.  Has probably had at least 5 or 6 hospitalizations.  Diagnoses look like they have gone between schizophrenia and depression.  Patient when she is at her baseline evidently functions pretty well at home.  Without her medicine however she has had multiple hospitalizations almost always in the same condition she was in when she came in for this 1.  No known history of suicide attempts.  Multiple medications including antipsychotics and antidepressants have been tried.Last hospitalization 08/15/2018 for 5 days at [facility].  Risk to Self: Suicidal Ideation: (UTA) Suicidal Intent: (UTA) Is patient at risk for suicide?: (UTA) Suicidal Plan?: (UTA) Access to Means: (UTA) What has been your use of drugs/alcohol within the last 12 months?: UTA How many times?: (UTA) Other Self Harm Risks: Pt has been out of her medication since roughly Dec 30 Triggers for Past Attempts: (UTA) Intentional Self Injurious Behavior: (UTA) Risk to Others: Homicidal Ideation: (UTA) Thoughts of Harm to Others: (UTA) Current Homicidal Intent: (UTA) Current Homicidal Plan: (UTA) Access to Homicidal Means: (UTA) Identified Victim: UTA History of harm to others?: (UTA) Assessment of  Violence: (UTA) Violent Behavior Description: UTA Does patient have access to weapons?: (UTA) Criminal Charges Pending?: (UTA) Does patient have a court date: (UTA) Prior Inpatient Therapy: Prior Inpatient Therapy: Yes Prior  Therapy Dates: 1998, 2010 - 1-2x/year every year after that Prior Therapy Facilty/Provider(s): [facility] Reason for Treatment: Schizophrenia Prior Outpatient Therapy: Prior Outpatient Therapy: No Does patient have an ACCT team?: No Does patient have Intensive In-House Services?  : No Does patient have [facility] services? : Yes Does patient have P4CC services?: No  Past Medical History:  Past Medical History:  Diagnosis Date  . Depression    Dr. Mila Homer, [facility]; hospitalization 07/2010  . History of echocardiogram 2011   [facility]  . History of renal stone   . Hypertension   . Hypokalemia   . Nephrolithiasis   . Obesity   . Schizophrenia (HCC)   . Wears glasses     Past Surgical History:  Procedure Laterality Date  . WISDOM TOOTH EXTRACTION     Family History:  Family History  Problem Relation Age of Onset  . Diabetes Mother   . Heart disease Mother   . Chronic Renal Failure Mother   . Cancer Maternal Uncle        lung  . Kidney disease Mother        dialysis  . Stroke Neg Hx   . Hypertension Neg Hx   . Hyperlipidemia Neg Hx    Family Psychiatric  History: None reported Social History:  Social History   Substance and Sexual Activity  Alcohol Use No     Social History   Substance and Sexual Activity  Drug Use No    Social History   Socioeconomic History  . Marital status: Married    Spouse name: Not on file  . Number of children: 2  . Years of education: 81  . Highest education level: Not on file  Occupational History  . Not on file  Tobacco Use  . Smoking status: Never Smoker  . Smokeless tobacco: Never Used  Substance and Sexual Activity  . Alcohol use: No  . Drug use: No  . Sexual activity: Not Currently  Other Topics Concern  . Not on file  Social History Narrative   ** Merged History Encounter **       Married, 2 children, mother in Social worker lives with them, was working as a Scientist, research (medical)  Strain:   . Difficulty of Paying Living Expenses: Not on file  Food Insecurity:   . Worried About Programme researcher, broadcasting/film/video in the Last Year: Not on file  . Ran Out of Food in the Last Year: Not on file  Transportation Needs:   . Lack of Transportation (Medical): Not on file  . Lack of Transportation (Non-Medical): Not on file  Physical Activity:   . Days of Exercise per Week: Not on file  . Minutes of Exercise per Session: Not on file  Stress:   . Feeling of Stress : Not on file  Social Connections:   . Frequency of Communication with Friends and Family: Not on file  . Frequency of Social Gatherings with Friends and Family: Not on file  . Attends Religious Services: Not on file  . Active Member of Clubs or Organizations: Not on file  . Attends Banker Meetings: Not on file  . Marital Status: Not on file   Additional Social History:    Allergies:  Allergies  Allergen Reactions  . Paliperidone Other (See Comments)    Angioedema, drooling, slurred speech, ptosis  . Lisinopril Rash and Other (See Comments)    Angioedema   . Sulfa Antibiotics Itching    Labs:  Results for orders placed or performed during the hospital encounter of 04/15/19 (from the past 48 hour(s))  Respiratory Panel by RT PCR (Flu A&B, Covid) - Nasopharyngeal Swab     Status: None   Collection Time: 04/16/19  2:11 AM   Specimen: Nasopharyngeal Swab  Result Value Ref Range   SARS Coronavirus 2 by RT PCR NEGATIVE NEGATIVE    Comment: (NOTE) SARS-CoV-2 target nucleic acids are NOT DETECTED. The SARS-CoV-2 RNA is generally detectable in upper respiratoy specimens during the acute phase of infection. The lowest concentration of SARS-CoV-2 viral copies this assay can detect is 131 copies/mL. A negative result does not preclude SARS-Cov-2 infection and should not be used as the sole basis for treatment or other patient management decisions. A negative result may occur with  improper specimen  collection/handling, submission of specimen other than nasopharyngeal swab, presence of viral mutation(s) within the areas targeted by this assay, and inadequate number of viral copies (<131 copies/mL). A negative result must be combined with clinical observations, patient history, and epidemiological information. The expected result is Negative. Fact Sheet for Patients:  https://www.moore.com/https://www.fda.gov/media/142436/download Fact Sheet for Healthcare Providers:  https://www.young.biz/https://www.fda.gov/media/142435/download This test is not yet ap proved or cleared by the [place] FDA and  has been authorized for detection and/or diagnosis of SARS-CoV-2 by FDA under an Emergency Use Authorization (EUA). This EUA will remain  in effect (meaning this test can be used) for the duration of the COVID-19 declaration under Section 564(b)(1) of the Act, 21 U.S.C. section 360bbb-3(b)(1), unless the authorization is terminated or revoked sooner.    Influenza A by PCR NEGATIVE NEGATIVE   Influenza B by PCR NEGATIVE NEGATIVE    Comment: (NOTE) The Xpert Xpress SARS-CoV-2/FLU/RSV assay is intended as an aid in  the diagnosis of influenza from Nasopharyngeal swab specimens and  should not be used as a sole basis for treatment. Nasal washings and  aspirates are unacceptable for Xpert Xpress SARS-CoV-2/FLU/RSV  testing. Fact Sheet for Patients: https://www.moore.com/https://www.fda.gov/media/142436/download Fact Sheet for Healthcare Providers: https://www.young.biz/https://www.fda.gov/media/142435/download This test is not yet approved or cleared by the [place] FDA and  has been authorized for detection and/or diagnosis of SARS-CoV-2 by  FDA under an Emergency Use Authorization (EUA). This EUA will remain  in effect (meaning this test can be used) for the duration of the  Covid-19 declaration under Section 564(b)(1) of the Act, 21  U.S.C. section 360bbb-3(b)(1), unless the authorization is  terminated or revoked. Performed at [facility], [address] [place], [place] [address]   Comprehensive metabolic panel     Status: Abnormal   Collection Time: 04/16/19  2:19 AM  Result Value Ref Range   Sodium 144 135 - 145 mmol/L   Potassium 2.2 (LL) 3.5 - 5.1 mmol/L    Comment: CRITICAL RESULT CALLED TO, READ BACK BY AND VERIFIED WITH: BARWICK J,RN 04/16/19 0256 WAYK    Chloride 103 98 - 111 mmol/L   CO2 27 22 - 32 mmol/L   Glucose, Bld 91 70 - 99 mg/dL   BUN 28 (H) 6 - 20 mg/dL   Creatinine, Ser 8.111.35 (H) 0.44 - 1.00 mg/dL   Calcium 8.9 8.9 - 91.410.3 mg/dL   Total Protein 6.9 6.5 - 8.1 g/dL   Albumin  3.9 3.5 - 5.0 g/dL   AST 19 15 - 41 U/L   ALT 22 0 - 44 U/L   Alkaline Phosphatase 62 38 - 126 U/L   Total Bilirubin 1.4 (H) 0.3 - 1.2 mg/dL   GFR calc non Af Amer 43 (L) >60 mL/min   GFR calc Af Amer 49 (L) >60 mL/min   Anion gap 14 5 - 15    Comment: Performed at [facility], [address], [place], [place] [address]  Ethanol     Status: None   Collection Time: 04/16/19  2:19 AM  Result Value Ref Range   Alcohol, Ethyl (B) <10 <10 mg/dL    Comment: (NOTE) Lowest detectable limit for serum alcohol is 10 mg/dL. For medical purposes only. Performed at [facility], [address], [place], [place] [address]   CBC with Diff     Status: None   Collection Time: 04/16/19  2:19 AM  Result Value Ref Range   WBC 6.7 4.0 - 10.5 K/uL   RBC 5.03 3.87 - 5.11 MIL/uL   Hemoglobin 14.6 12.0 - 15.0 g/dL   HCT 82.4 23.5 - 36.1 %   MCV 88.9 80.0 - 100.0 fL   MCH 29.0 26.0 - 34.0 pg   MCHC 32.7 30.0 - 36.0 g/dL   RDW 44.3 15.4 - 00.8 %   Platelets 369 150 - 400 K/uL   nRBC 0.0 0.0 - 0.2 %   Neutrophils Relative % 66 %   Neutro Abs 4.4 1.7 - 7.7 K/uL   Lymphocytes Relative 23 %   Lymphs Abs 1.6 0.7 - 4.0 K/uL   Monocytes Relative 9 %   Monocytes Absolute 0.6 0.1 - 1.0 K/uL   Eosinophils Relative 2 %   Eosinophils Absolute 0.1 0.0 - 0.5 K/uL   Basophils Relative 0 %   Basophils Absolute 0.0 0.0 - 0.1 K/uL   Immature  Granulocytes 0 %   Abs Immature Granulocytes 0.02 0.00 - 0.07 K/uL    Comment: Performed at [facility], [address], [place], [place] [address]  Magnesium     Status: None   Collection Time: 04/16/19  2:59 AM  Result Value Ref Range   Magnesium 2.3 1.7 - 2.4 mg/dL    Comment: Performed at [facility], [address], [place], [place] [address]  Comprehensive metabolic panel     Status: Abnormal   Collection Time: 04/16/19  6:30 AM  Result Value Ref Range   Sodium 148 (H) 135 - 145 mmol/L   Potassium 2.5 (LL) 3.5 - 5.1 mmol/L    Comment: CRITICAL RESULT CALLED TO, READ BACK BY AND VERIFIED WITH: [name] ROBERTSON,RN AT 6712 04/16/2019 BY ZBEECH.    Chloride 102 98 - 111 mmol/L   CO2 30 22 - 32 mmol/L   Glucose, Bld 86 70 - 99 mg/dL   BUN 25 (H) 6 - 20 mg/dL   Creatinine, Ser 4.58 (H) 0.44 - 1.00 mg/dL   Calcium 9.3 8.9 - 09.9 mg/dL   Total Protein 7.8 6.5 - 8.1 g/dL   Albumin 4.2 3.5 - 5.0 g/dL   AST 21 15 - 41 U/L   ALT 24 0 - 44 U/L   Alkaline Phosphatase 68 38 - 126 U/L   Total Bilirubin 1.2 0.3 - 1.2 mg/dL   GFR calc non Af Amer 52 (L) >60 mL/min   GFR calc Af Amer 60 (L) >60 mL/min   Anion gap 16 (H) 5 -  15    Comment: Performed at [facility], [address] [place], [place] [address]  CBC     Status: Abnormal   Collection Time: 04/16/19  6:30 AM  Result Value Ref Range   WBC 8.1 4.0 - 10.5 K/uL   RBC 5.51 (H) 3.87 - 5.11 MIL/uL   Hemoglobin 15.9 (H) 12.0 - 15.0 g/dL   HCT 19.149.5 (H) 47.836.0 - 29.546.0 %   MCV 89.8 80.0 - 100.0 fL   MCH 28.9 26.0 - 34.0 pg   MCHC 32.1 30.0 - 36.0 g/dL   RDW 62.112.2 30.811.5 - 65.715.5 %   Platelets 359 150 - 400 K/uL   nRBC 0.0 0.0 - 0.2 %    Comment: Performed at [facility], [address] [place], [place] [address]  Basic metabolic panel     Status: Abnormal   Collection Time: 04/16/19  1:00 PM  Result Value Ref Range   Sodium 144 135 - 145 mmol/L   Potassium 2.8 (L) 3.5 - 5.1 mmol/L   Chloride 105 98 - 111  mmol/L   CO2 25 22 - 32 mmol/L   Glucose, Bld 85 70 - 99 mg/dL   BUN 21 (H) 6 - 20 mg/dL   Creatinine, Ser 2.950.86 0.44 - 1.00 mg/dL   Calcium 8.8 (L) 8.9 - 10.3 mg/dL   GFR calc non Af Amer >60 >60 mL/min   GFR calc Af Amer >60 >60 mL/min   Anion gap 14 5 - 15    Comment: Performed at [facility], [address] [place], [place] [address]  Glucose, capillary     Status: None   Collection Time: 04/16/19  3:08 PM  Result Value Ref Range   Glucose-Capillary 70 70 - 99 mg/dL   Comment 1 Notify RN    Comment 2 Document in Chart   Basic metabolic panel     Status: Abnormal   Collection Time: 04/16/19  6:42 PM  Result Value Ref Range   Sodium 146 (H) 135 - 145 mmol/L   Potassium 3.3 (L) 3.5 - 5.1 mmol/L   Chloride 106 98 - 111 mmol/L   CO2 26 22 - 32 mmol/L   Glucose, Bld 70 70 - 99 mg/dL   BUN 20 6 - 20 mg/dL   Creatinine, Ser 2.440.91 0.44 - 1.00 mg/dL   Calcium 9.1 8.9 - 01.010.3 mg/dL   GFR calc non Af Amer >60 >60 mL/min   GFR calc Af Amer >60 >60 mL/min   Anion gap 14 5 - 15    Comment: Performed at [facility], [address] [place], [place] [address]  Basic metabolic panel     Status: Abnormal   Collection Time: 04/17/19  2:51 AM  Result Value Ref Range   Sodium 144 135 - 145 mmol/L   Potassium 2.7 (LL) 3.5 - 5.1 mmol/L    Comment: CRITICAL RESULT CALLED TO, READ BACK BY AND VERIFIED WITH: B.BUCKNER,RN 0357 04/17/2019 M.CAMPBELL    Chloride 107 98 - 111 mmol/L   CO2 24 22 - 32 mmol/L   Glucose, Bld 76 70 - 99 mg/dL   BUN 23 (H) 6 - 20 mg/dL   Creatinine, Ser 6.641.07 (H) 0.44 - 1.00 mg/dL   Calcium 8.5 (L) 8.9 - 10.3 mg/dL   GFR calc non Af Amer 56 (L) >60 mL/min   GFR calc Af Amer >60 >60 mL/min   Anion gap 13 5 - 15    Comment:  Performed at [facility], [address] [place], [place] [address]  Magnesium     Status: None   Collection Time: 04/17/19  2:51 AM  Result Value Ref Range   Magnesium 2.1 1.7 - 2.4 mg/dL    Comment: Performed at [facility], [address], [place], [place] [address]  Glucose, capillary     Status: Abnormal   Collection Time: 04/17/19  7:42 AM  Result Value Ref Range   Glucose-Capillary 68 (L) 70 - 99 mg/dL  Glucose, capillary     Status: None   Collection Time: 04/17/19  8:44 AM  Result Value Ref Range   Glucose-Capillary 75 70 - 99 mg/dL  Glucose, capillary     Status: None   Collection Time: 04/17/19 11:28 AM  Result Value Ref Range   Glucose-Capillary 70 70 - 99 mg/dL  Basic metabolic panel     Status: Abnormal   Collection Time: 04/17/19 12:46 PM  Result Value Ref Range   Sodium 146 (H) 135 - 145 mmol/L   Potassium 3.6 3.5 - 5.1 mmol/L   Chloride 108 98 - 111 mmol/L   CO2 22 22 - 32 mmol/L   Glucose, Bld 73 70 - 99 mg/dL   BUN 21 (H) 6 - 20 mg/dL   Creatinine, Ser 0.98 (H) 0.44 - 1.00 mg/dL   Calcium 9.0 8.9 - 11.9 mg/dL   GFR calc non Af Amer 56 (L) >60 mL/min   GFR calc Af Amer >60 >60 mL/min   Anion gap 16 (H) 5 - 15    Comment: Performed at [facility], [address], [place], [place] [address]  Urine rapid drug screen (hosp performed)     Status: None   Collection Time: 04/17/19  1:51 PM  Result Value Ref Range   Opiates NONE DETECTED NONE DETECTED   Cocaine NONE DETECTED NONE DETECTED   Benzodiazepines NONE DETECTED NONE DETECTED   Amphetamines NONE DETECTED NONE DETECTED   Tetrahydrocannabinol NONE DETECTED NONE DETECTED   Barbiturates NONE DETECTED NONE DETECTED    Comment: (NOTE) DRUG SCREEN FOR MEDICAL PURPOSES ONLY.  IF CONFIRMATION IS NEEDED FOR ANY PURPOSE, NOTIFY LAB WITHIN 5 DAYS. LOWEST DETECTABLE LIMITS FOR URINE DRUG SCREEN Drug Class                     Cutoff (ng/mL) Amphetamine and metabolites    1000 Barbiturate and metabolites    200 Benzodiazepine                 200 Tricyclics and metabolites     300 Opiates and metabolites        300 Cocaine and metabolites        300 THC                            50 Performed at [facility],  [address], [place], [place] [address]   Urinalysis, Routine w reflex microscopic     Status: Abnormal   Collection Time: 04/17/19  1:52 PM  Result Value Ref Range   Color, Urine YELLOW YELLOW   APPearance CLOUDY (A) CLEAR   Specific Gravity, Urine 1.021 1.005 - 1.030   pH 5.0 5.0 - 8.0   Glucose, UA NEGATIVE NEGATIVE mg/dL   Hgb urine dipstick SMALL (A) NEGATIVE   Bilirubin Urine NEGATIVE NEGATIVE   Ketones, ur 80 (A) NEGATIVE mg/dL  Protein, ur NEGATIVE NEGATIVE mg/dL   Nitrite NEGATIVE NEGATIVE   Leukocytes,Ua SMALL (A) NEGATIVE   RBC / HPF 21-50 0 - 5 RBC/hpf   WBC, UA 21-50 0 - 5 WBC/hpf   Bacteria, UA MANY (A) NONE SEEN   Squamous Epithelial / LPF 0-5 0 - 5   Mucus PRESENT     Comment: Performed at [facility], [address] [place], [place] [address]    Current Facility-Administered Medications  Medication Dose Route Frequency Provider Last Rate Last Admin  . 0.9 % NaCl with KCl 20 mEq/ L  infusion   Intravenous Continuous Eulogio Bear U, DO 50 mL/hr at 04/17/19 1245 New Bag at 04/17/19 1245  . acetaminophen (TYLENOL) tablet 650 mg  650 mg Oral Q6H PRN Jani Gravel, MD       Or  . acetaminophen (TYLENOL) suppository 650 mg  650 mg Rectal Q6H PRN Jani Gravel, MD      . amLODipine (NORVASC) tablet 5 mg  5 mg Oral Daily Vann, Jessica U, DO      . dextrose 50 % solution 25 mL  25 mL Intravenous Q4H PRN Vann, Jessica U, DO      . enoxaparin (LOVENOX) injection 40 mg  40 mg Subcutaneous Daily Vann, Jessica U, DO   40 mg at 04/16/19 1046  . FLUoxetine (PROZAC) capsule 40 mg  40 mg Oral BID Vann, Jessica U, DO      . hydrALAZINE (APRESOLINE) injection 10 mg  10 mg Intravenous Q6H PRN Eulogio Bear U, DO   10 mg at 04/17/19 0941  . OLANZapine (ZYPREXA) tablet 20 mg  20 mg Oral QHS Geradine Girt, DO        Musculoskeletal: Strength & Muscle Tone: decreased Gait & Station: Patient remained in bed during evaluation Patient leans: N/A  Psychiatric Specialty  Exam: Physical Exam  Nursing note and vitals reviewed. Constitutional: She is oriented to person, place, and time. She appears well-developed.  Cardiovascular: Normal rate.  Respiratory: Effort normal.  Musculoskeletal:        General: Normal range of motion.  Neurological: She is alert and oriented to person, place, and time.  Skin: Skin is warm.    Review of Systems  HENT: Negative.   Eyes: Negative.   Respiratory: Negative.   Cardiovascular: Negative.   Gastrointestinal: Negative.   Genitourinary: Negative.   Musculoskeletal: Negative.   Psychiatric/Behavioral: Positive for dysphoric mood and hallucinations.    Blood pressure (!) 148/87, pulse 91, temperature 98 F (36.7 C), temperature source Oral, resp. rate 15, SpO2 100 %.There is no height or weight on file to calculate BMI.  General Appearance: Disheveled  Eye Contact:  Minimal  Speech:  Slow  Volume:  Decreased  Mood:  Depressed  Affect:  Constricted  Thought Process:  Linear and Descriptions of Associations: Intact  Orientation:  Full (Time, Place, and Person)  Thought Content:  Illogical  Suicidal Thoughts:  No  Homicidal Thoughts:  No  Memory:  Immediate;   Poor Recent;   Poor Remote;   Poor  Judgement:  Impaired  Insight:  Shallow  Psychomotor Activity:  Decreased  Concentration:  Concentration: Poor  Recall:  Poor  Fund of Knowledge:  Poor  Language:  Poor  Akathisia:  No  Handed:    AIMS (if indicated):     Assets:  Financial Resources/Insurance Housing Social Support  ADL's:  Impaired  Cognition:  Impaired,  Mild  Sleep:  Treatment Plan Summary: Medication management  Continue Zyprexa 20 mg PO QHS and continue encouragment of medications Zyprexa 10 mg IM Q12H after forced medication order until compliant with oral medications Decrease Prozac 20 mg PO Daily and continued encouragement to take medications   Disposition: Recommend psychiatric Inpatient admission when medically cleared.  Patient needs to be eating and able to care for self before psychiatric admission  Maryfrances Bunnell, FNP 04/17/2019 3:42 PM   Addendum- 04/17/2019 at 6,25 PM.  Attest to NP Assessment I have reviewed case with Feliz Beam , NP, with Dr. Benjamine Mola, attending, and with RN. In brief, 59 year old female, history of Schizophrenia ,Depression. She has not been taking her psychiatric medications recently ( Zyprexa, Prozac) .  She presented to [facility] recently but was found to be severely hypertensive , and required inpatient medical admission due to hypokalemia, AKI ( admission Cr 1.35) , and possible encephalopathy . Report from staff is that patient has been refusing medications, declining food /PO fluids, although earlier today did eat some pudding or similar with RN encouragement . Patient presents alert, attentive, guarded. Speech is soft/ limited and answers only some questions. She is oriented to Jan 2021 and to hospital.  She acknowledges she has refused medications but cannot explain what her reason to do so is . She denies hallucinations but does appear internally preoccupied at times . I was able to speak with her husband , who came to visit her. He states this decompensation has been similar to prior episodes and that she had been doing noticeably better on her medications. States he thinks she stopped them because she was feeling better at the time. He does not think she was having side effects from them. She was admitted to [facility] in May 2020 for a similar presentation during which she also had become withdrawn and refusing PO intake. At the time she gradually improved and was discharged on Zyprexa/Prozac . * 1/19 EKG QTc 353   Recommendation-  As discussed with Dr. Benjamine Mola, I agree with medication over objection if necessary, as patient's condition is  likely to persist or worsen without the appropriate medication management. Note that husband is at bedside and patient appears somewhat  more  reactive and engaged with his presence .* Husband states he thinks he can get patient to agree to take Zyprexa , particularly if it is Zydis , and it is noted she did accept some PO food from staff earlier today. In this context, if considered appropriate, may offer PO Zyprexa Zydis while he is present to provide support.  Consider Zyprexa  Zydis 5 mgrs BID initially, titrate as tolerated . Patient will need inpatient psychiatric treatment when medically stabilized .  Sallyanne Havers, MD

## 2019-04-17 NOTE — Progress Notes (Signed)
Pt's MEWS score changed to red, RN rechecked pt's VS, BP went down from 180/170 at 1033  to 150/77 at 1137. MEWS score changed to green again, pt's stable, will continue to monitor.

## 2019-04-17 NOTE — Progress Notes (Signed)
CRITICAL VALUE ALERT  Critical Value:  Potassium 2.7  Date & Time Notied:  04/17/2019 at 0357  Provider Notified: On call for [facility] paged via Amion   Orders Received/Actions taken: orders entered per on call

## 2019-04-18 DIAGNOSIS — F331 Major depressive disorder, recurrent, moderate: Secondary | ICD-10-CM

## 2019-04-18 DIAGNOSIS — Z9119 Patient's noncompliance with other medical treatment and regimen: Secondary | ICD-10-CM

## 2019-04-18 DIAGNOSIS — E870 Hyperosmolality and hypernatremia: Secondary | ICD-10-CM

## 2019-04-18 LAB — BASIC METABOLIC PANEL
Anion gap: 9 (ref 5–15)
BUN: 14 mg/dL (ref 6–20)
CO2: 26 mmol/L (ref 22–32)
Calcium: 8.9 mg/dL (ref 8.9–10.3)
Chloride: 111 mmol/L (ref 98–111)
Creatinine, Ser: 0.83 mg/dL (ref 0.44–1.00)
GFR calc Af Amer: >60 mL/min (ref >60)
GFR calc non Af Amer: >60 mL/min (ref >60)
Glucose, Bld: 126 mg/dL — ABNORMAL HIGH (ref 70–99)
Potassium: 3.5 mmol/L (ref 3.5–5.1)
Sodium: 146 mmol/L — ABNORMAL HIGH (ref 135–145)

## 2019-04-18 LAB — GLUCOSE, CAPILLARY
Glucose-Capillary: 97 mg/dL (ref 70–99)
Glucose-Capillary: 109 mg/dL — ABNORMAL HIGH (ref 70–99)
Glucose-Capillary: 115 mg/dL — ABNORMAL HIGH (ref 70–99)
Glucose-Capillary: 106 mg/dL — ABNORMAL HIGH (ref 70–99)

## 2019-04-18 LAB — CBC
HCT: 44.7 % (ref 36.0–46.0)
Hemoglobin: 14.2 g/dL (ref 12.0–15.0)
MCH: 28.9 pg (ref 26.0–34.0)
MCHC: 31.8 g/dL (ref 30.0–36.0)
MCV: 90.9 fL (ref 80.0–100.0)
Platelets: 333 10*3/uL (ref 150–400)
RBC: 4.92 MIL/uL (ref 3.87–5.11)
RDW: 12.5 % (ref 11.5–15.5)
WBC: 8.2 10*3/uL (ref 4.0–10.5)
nRBC: 0 % (ref 0.0–0.2)

## 2019-04-18 LAB — MAGNESIUM: Magnesium: 1.9 mg/dL (ref 1.7–2.4)

## 2019-04-18 MED ORDER — KCL IN DEXTROSE-NACL 20-5-0.2 MEQ/L-%-% IV SOLN
INTRAVENOUS | Status: DC
  Filled 2019-04-18 (×2): qty 1000

## 2019-04-18 MED ORDER — POTASSIUM CHLORIDE 10 MEQ/100ML IV SOLN
10.0000 meq | INTRAVENOUS | Status: AC
  Administered 2019-04-18 (×4): 10 meq via INTRAVENOUS
  Filled 2019-04-18 (×4): qty 100

## 2019-04-18 MED ORDER — AMLODIPINE BESYLATE 10 MG PO TABS
10.0000 mg | ORAL_TABLET | Freq: Every day | ORAL | Status: DC
  Administered 2019-04-19: 10 mg via ORAL
  Filled 2019-04-18 (×5): qty 1

## 2019-04-18 MED ORDER — POTASSIUM CHLORIDE CRYS ER 20 MEQ PO TBCR: 40.0000 meq | EXTENDED_RELEASE_TABLET | Freq: Once | ORAL | Status: DC

## 2019-04-18 NOTE — Progress Notes (Signed)
PROGRESS NOTE  Amy Casey YJE:[id number] DOB: 1961-02-10   PCP: Patient, No Pcp Per  Patient is from: Home.  DOA: 04/15/2019 LOS: 2  Brief Narrative / Interim history: 59 year old female with history of HTN, depression and schizophrenia sent to ER from [facility] for elevated blood pressure.  Reportedly has been of care site medications for sometimes and taken to [facility] by family members.  Noted to have elevated blood pressure and sent to the ED.  In ED, she was noted to have elevated blood pressure, hypokalemia and AKI for which she was admitted.  IVC paperwork was completed and psychiatry was consulted.   Psychiatry made adjustments to his psych medications and recommended inpatient psych when medically stable.  Subjective: No major events overnight or this morning.  She responds "I am fine" to greeting.  She says "I want to get out of here".  She did not want to talk more.  No apparent distress.  Safety sitter at bedside.  Objective: Vitals:   04/18/19 0545 04/18/19 0755 04/18/19 0928 04/18/19 1130  BP:   (!) 161/89 132/87  Pulse:   72 90  Resp:   (!) 34 18  Temp: 98.2 F (36.8 C) 98.8 F (37.1 C)  99.4 F (37.4 C)  TempSrc: Oral Axillary  Oral  SpO2:   99% 100%  Weight:        Intake/Output Summary (Last 24 hours) at 04/18/2019 1520 Last data filed at 04/18/2019 1229 Gross per 24 hour  Intake 2220.72 ml  Output 1200 ml  Net 1020.72 ml   Filed Weights   04/18/19 0500  Weight: 76.3 kg    Examination:  GENERAL: No apparent distress.  Nontoxic. HEENT: MMM.  Vision and hearing grossly intact.  NECK: Supple.  No apparent JVD.  RESP:  No IWOB. Good air movement bilaterally. CVS:  RRR. Heart sounds normal.  ABD/GI/GU: Bowel sounds present. Soft. Non tender.  MSK/EXT:  Moves extremities. No apparent deformity. No edema.  SKIN: no apparent skin lesion or wound NEURO: Awake, alert.  No apparent focal neuro deficit. PSYCH: Calm.  Flat affect.  Procedures:   None  Assessment & Plan: Essential hypertension: Normotensive now. -Increase amlodipine to 10 mg daily (home dose). -Continue as needed hydralazine -Discontinue IV metoprolol.  Schizophrenia/major depressive disorder: Reportedly declined p.o. intake for 3 days POA.  UDS negative.  Similar presentation in the past when hospitalized at [facility] and discharged on Prozac and Zyprexa. -IVCd in ED -Continue safety sitter -Zyprexa 10 mg twice daily and Prozac 20 mg daily.  AKI: Likely prerenal due to poor p.o. intake in the setting of MDD.  Has not been eating or taking any medications recently.  AKI resolved. -IV fluid as below.  Hypernatremia: Na 146 likely due to NS fluid -Start D5-1/4NS-KCl  Hypokalemia: Improved.  Magnesium within normal. -K-Dur 40 mg x 1  Pyuria/bacteriuria: Difficult to interpret as patient does not provide history.  Has no leukocytosis or objective fevers to suggest UTI. -Continue monitoring of antibiotics               DVT prophylaxis: Subcu heparin Code Status: Full code Family Communication: Patient and/or RN. Available if any question. Disposition Plan: Remains inpatient for hypernatremia and hypokalemia.  Final disposition inpatient psych Consultants: Psychiatry   Microbiology summarized: COVID-19 negative. Influenza PCR negative.  Sch Meds:  Scheduled Meds: . amLODipine  5 mg Oral Daily  . enoxaparin (LOVENOX) injection  40 mg Subcutaneous Daily  . FLUoxetine  20 mg Oral Daily  .  metoprolol tartrate  5 mg Intravenous Q6H  . OLANZapine zydis  10 mg Oral BID   Continuous Infusions: . dextrose 5 % and 0.9 % NaCl with KCl 20 mEq/L 75 mL/hr at 04/18/19 0616   PRN Meds:.acetaminophen **OR** acetaminophen, dextrose, hydrALAZINE  Antimicrobials: Anti-infectives (From admission, onward)   None       I have personally reviewed the following labs and images: CBC: Recent Labs  Lab 04/16/19 0219 04/16/19 0630 04/18/19 1230  WBC 6.7 8.1  8.2  NEUTROABS 4.4  --   --   HGB 14.6 15.9* 14.2  HCT 44.7 49.5* 44.7  MCV 88.9 89.8 90.9  PLT 369 359 333   BMP &GFR Recent Labs  Lab 04/16/19 0259 04/16/19 0630 04/16/19 1300 04/16/19 1842 04/17/19 0251 04/17/19 1246 04/18/19 1230  NA  --    < > 144 146* 144 146* 146*  K  --    < > 2.8* 3.3* 2.7* 3.6 3.5  CL  --    < > 105 106 107 108 111  CO2  --    < > 25 26 24 22 26   GLUCOSE  --    < > 85 70 76 73 126*  BUN  --    < > 21* 20 23* 21* 14  CREATININE  --    < > 0.86 0.91 1.07* 1.08* 0.83  CALCIUM  --    < > 8.8* 9.1 8.5* 9.0 8.9  MG 2.3  --   --   --  2.1  --  1.9   < > = values in this interval not displayed.   Estimated Creatinine Clearance: 77.9 mL/min (by C-G formula based on SCr of 0.83 mg/dL). Liver & Pancreas: Recent Labs  Lab 04/16/19 0219 04/16/19 0630  AST 19 21  ALT 22 24  ALKPHOS 62 68  BILITOT 1.4* 1.2  PROT 6.9 7.8  ALBUMIN 3.9 4.2   No results for input(s): LIPASE, AMYLASE in the last 168 hours. No results for input(s): AMMONIA in the last 168 hours. Diabetic: No results for input(s): HGBA1C in the last 72 hours. Recent Labs  Lab 04/17/19 1128 04/17/19 1615 04/17/19 2133 04/18/19 0753 04/18/19 1218  GLUCAP 70 58* 99 97 109*   Cardiac Enzymes: No results for input(s): CKTOTAL, CKMB, CKMBINDEX, TROPONINI in the last 168 hours. No results for input(s): PROBNP in the last 8760 hours. Coagulation Profile: No results for input(s): INR, PROTIME in the last 168 hours. Thyroid Function Tests: No results for input(s): TSH, T4TOTAL, FREET4, T3FREE, THYROIDAB in the last 72 hours. Lipid Profile: No results for input(s): CHOL, HDL, LDLCALC, TRIG, CHOLHDL, LDLDIRECT in the last 72 hours. Anemia Panel: No results for input(s): VITAMINB12, FOLATE, FERRITIN, TIBC, IRON, RETICCTPCT in the last 72 hours. Urine analysis:    Component Value Date/Time   COLORURINE YELLOW 04/17/2019 1352   APPEARANCEUR CLOUDY (A) 04/17/2019 1352   LABSPEC 1.021  04/17/2019 1352   LABSPEC 1.015 08/21/2018 1154   PHURINE 5.0 04/17/2019 1352   GLUCOSEU NEGATIVE 04/17/2019 1352   HGBUR SMALL (A) 04/17/2019 1352   BILIRUBINUR NEGATIVE 04/17/2019 1352   BILIRUBINUR negative 08/21/2018 1154   BILIRUBINUR n 06/02/2016 1251   KETONESUR 80 (A) 04/17/2019 1352   PROTEINUR NEGATIVE 04/17/2019 1352   UROBILINOGEN negative 06/02/2016 1251   UROBILINOGEN 1.0 02/19/2014 1849   NITRITE NEGATIVE 04/17/2019 1352   LEUKOCYTESUR SMALL (A) 04/17/2019 1352   Sepsis Labs: Invalid input(s): PROCALCITONIN, LACTICIDVEN  Microbiology: Recent Results (from the past 240 hour(s))  Respiratory Panel by RT PCR (Flu A&B, Covid) - Nasopharyngeal Swab     Status: None   Collection Time: 04/16/19  2:11 AM   Specimen: Nasopharyngeal Swab  Result Value Ref Range Status   SARS Coronavirus 2 by RT PCR NEGATIVE NEGATIVE Final    Comment: (NOTE) SARS-CoV-2 target nucleic acids are NOT DETECTED. The SARS-CoV-2 RNA is generally detectable in upper respiratoy specimens during the acute phase of infection. The lowest concentration of SARS-CoV-2 viral copies this assay can detect is 131 copies/mL. A negative result does not preclude SARS-Cov-2 infection and should not be used as the sole basis for treatment or other patient management decisions. A negative result may occur with  improper specimen collection/handling, submission of specimen other than nasopharyngeal swab, presence of viral mutation(s) within the areas targeted by this assay, and inadequate number of viral copies (<131 copies/mL). A negative result must be combined with clinical observations, patient history, and epidemiological information. The expected result is Negative. Fact Sheet for Patients:  PinkCheek.be Fact Sheet for Healthcare Providers:  GravelBags.it This test is not yet ap proved or cleared by the [place] FDA and  has been authorized for  detection and/or diagnosis of SARS-CoV-2 by FDA under an Emergency Use Authorization (EUA). This EUA will remain  in effect (meaning this test can be used) for the duration of the COVID-19 declaration under Section 564(b)(1) of the Act, 21 U.S.C. section 360bbb-3(b)(1), unless the authorization is terminated or revoked sooner.    Influenza A by PCR NEGATIVE NEGATIVE Final   Influenza B by PCR NEGATIVE NEGATIVE Final    Comment: (NOTE) The Xpert Xpress SARS-CoV-2/FLU/RSV assay is intended as an aid in  the diagnosis of influenza from Nasopharyngeal swab specimens and  should not be used as a sole basis for treatment. Nasal washings and  aspirates are unacceptable for Xpert Xpress SARS-CoV-2/FLU/RSV  testing. Fact Sheet for Patients: PinkCheek.be Fact Sheet for Healthcare Providers: GravelBags.it This test is not yet approved or cleared by the [place] FDA and  has been authorized for detection and/or diagnosis of SARS-CoV-2 by  FDA under an Emergency Use Authorization (EUA). This EUA will remain  in effect (meaning this test can be used) for the duration of the  Covid-19 declaration under Section 564(b)(1) of the Act, 21  U.S.C. section 360bbb-3(b)(1), unless the authorization is  terminated or revoked. Performed at [facility], [place] [address], [place], [place] [address]     Radiology Studies: No results found.    Dennisse Swader T. [place]  If 7PM-7AM, please contact night-coverage [email or web address] Password [email or web address] 04/18/2019, 3:20 PM

## 2019-04-19 LAB — GLUCOSE, CAPILLARY
Glucose-Capillary: 105 mg/dL — ABNORMAL HIGH (ref 70–99)
Glucose-Capillary: 85 mg/dL (ref 70–99)
Glucose-Capillary: 128 mg/dL — ABNORMAL HIGH (ref 70–99)
Glucose-Capillary: 148 mg/dL — ABNORMAL HIGH (ref 70–99)

## 2019-04-19 LAB — BASIC METABOLIC PANEL
Anion gap: 10 (ref 5–15)
BUN: 12 mg/dL (ref 6–20)
CO2: 26 mmol/L (ref 22–32)
Calcium: 8.8 mg/dL — ABNORMAL LOW (ref 8.9–10.3)
Chloride: 106 mmol/L (ref 98–111)
Creatinine, Ser: 0.85 mg/dL (ref 0.44–1.00)
GFR calc Af Amer: >60 mL/min (ref >60)
GFR calc non Af Amer: >60 mL/min (ref >60)
Glucose, Bld: 130 mg/dL — ABNORMAL HIGH (ref 70–99)
Potassium: 3.3 mmol/L — ABNORMAL LOW (ref 3.5–5.1)
Sodium: 142 mmol/L (ref 135–145)

## 2019-04-19 LAB — MAGNESIUM: Magnesium: 1.7 mg/dL (ref 1.7–2.4)

## 2019-04-19 LAB — HIV ANTIBODY (ROUTINE TESTING W REFLEX): HIV Screen 4th Generation wRfx: NONREACTIVE

## 2019-04-19 MED ORDER — POTASSIUM CHLORIDE 10 MEQ/100ML IV SOLN
10.0000 meq | INTRAVENOUS | Status: AC
  Administered 2019-04-19 (×4): 10 meq via INTRAVENOUS
  Filled 2019-04-19: qty 100

## 2019-04-19 MED ORDER — KCL IN DEXTROSE-NACL 40-5-0.45 MEQ/L-%-% IV SOLN
INTRAVENOUS | Status: DC
  Filled 2019-04-19 (×2): qty 1000

## 2019-04-19 NOTE — Progress Notes (Signed)
PROGRESS NOTE  Amy Casey NID:[id number] DOB: [birth year]   PCP: Patient, No Pcp Per  Patient is from: Home.  DOA: 04/15/2019 LOS: 3  Brief Narrative / Interim history: 59 year old female with history of HTN, depression and schizophrenia sent to ER from [facility] for elevated blood pressure.  Reportedly has been of care site medications for sometimes and taken to [facility] by family members.  Noted to have elevated blood pressure and sent to the ED.  In ED, she was noted to have elevated blood pressure, hypokalemia and AKI for which she was admitted.  IVC paperwork was completed and psychiatry was consulted.   Psychiatry made adjustments to his psych medications and recommended inpatient psych when medically stable.  Subjective: No major events overnight of this morning.  Responds "I am fine" to greeting but didn't want to talk more.  Does not appear to be in distress.  She continues to refuse p.o. intake.  Objective: Vitals:   04/19/19 0320 04/19/19 0523 04/19/19 0617 04/19/19 0800  BP: (!) 189/95  132/71   Pulse: 75     Resp: 20  19   Temp: 98.1 F (36.7 C)   98.2 F (36.8 C)  TempSrc: Oral   Axillary  SpO2: 100%  100% 100%  Weight:  76.8 kg      Intake/Output Summary (Last 24 hours) at 04/19/2019 1157 Last data filed at 04/19/2019 1443 Gross per 24 hour  Intake 1295.19 ml  Output 400 ml  Net 895.19 ml   Filed Weights   04/18/19 0500 04/19/19 0523  Weight: 76.3 kg 76.8 kg    Examination:  GENERAL: No apparent distress.  Nontoxic. HEENT: MMM.  Vision and hearing grossly intact.  NECK: Supple.  No apparent JVD.  RESP:  No IWOB. Good air movement bilaterally. CVS:  RRR. Heart sounds normal.  ABD/GI/GU: Bowel sounds present. Soft. Non tender.  MSK/EXT:  Moves extremities. No apparent deformity. No edema.  SKIN: no apparent skin lesion or wound NEURO: Awake, alert but doesn't talk much.  No apparent focal neuro deficit. PSYCH: Calm.  Flat affect.  Procedures:    None  Assessment & Plan: Essential hypertension: Normotensive now. -Continue amlodipine to 10 mg daily (home dose). -Continue as needed hydralazine  Schizophrenia/major depressive disorder: Reportedly declined p.o. intake for 3 days POA.  UDS negative.  Similar presentation in the past when hospitalized at [facility] and discharged on Prozac and Zyprexa. -IVCd in ED -Continue safety sitter -Zyprexa 10 mg twice daily and Prozac 20 mg daily.  AKI: Likely prerenal due to poor p.o. intake in the setting of MDD.  Has not been eating or taking any medications recently.  AKI resolved.  Hypernatremia: Resolved. -Change IV fluid to D5-1/NS-KCl 40 at 75 cc an hour   Hypokalemia: Magnesium within normal. -IV KCl 10 mEq x 4.  Also supplementing with IV fluid as above  Pyuria/bacteriuria: difficult to interpret as patient does not provide history.  Has no leukocytosis or objective fevers to suggest UTI.  No suprapubic tenderness. -Continue monitoring off antibiotics               DVT prophylaxis: Subcu heparin Code Status: Full code Family Communication: Patient and/or RN. Available if any question. Disposition Plan: Remains inpatient for hypokalemia.  Final disposition inpatient psych. Consultants: Psychiatry   Microbiology summarized: COVID-19 negative. Influenza PCR negative.  Sch Meds:  Scheduled Meds: . amLODipine  10 mg Oral Daily  . enoxaparin (LOVENOX) injection  40 mg Subcutaneous Daily  . FLUoxetine  20  mg Oral Daily  . OLANZapine zydis  10 mg Oral BID   Continuous Infusions: . dextrose 5 % and 0.45 % NaCl with KCl 40 mEq/L 75 mL/hr at 04/19/19 0821  . potassium chloride 10 mEq (04/19/19 1036)   PRN Meds:.acetaminophen **OR** acetaminophen, dextrose, hydrALAZINE  Antimicrobials: Anti-infectives (From admission, onward)   None       I have personally reviewed the following labs and images: CBC: Recent Labs  Lab 04/16/19 0219 04/16/19 0630 04/18/19 1230   WBC 6.7 8.1 8.2  NEUTROABS 4.4  --   --   HGB 14.6 15.9* 14.2  HCT 44.7 49.5* 44.7  MCV 88.9 89.8 90.9  PLT 369 359 333   BMP &GFR Recent Labs  Lab 04/16/19 0259 04/16/19 0630 04/16/19 1842 04/17/19 0251 04/17/19 1246 04/18/19 1230 04/19/19 0414  NA  --    < > 146* 144 146* 146* 142  K  --    < > 3.3* 2.7* 3.6 3.5 3.3*  CL  --    < > 106 107 108 111 106  CO2  --    < > 26 24 22 26 26   GLUCOSE  --    < > 70 76 73 126* 130*  BUN  --    < > 20 23* 21* 14 12  CREATININE  --    < > 0.91 1.07* 1.08* 0.83 0.85  CALCIUM  --    < > 9.1 8.5* 9.0 8.9 8.8*  MG 2.3  --   --  2.1  --  1.9 1.7   < > = values in this interval not displayed.   Estimated Creatinine Clearance: 76.1 mL/min (by C-G formula based on SCr of 0.85 mg/dL). Liver & Pancreas: Recent Labs  Lab 04/16/19 0219 04/16/19 0630  AST 19 21  ALT 22 24  ALKPHOS 62 68  BILITOT 1.4* 1.2  PROT 6.9 7.8  ALBUMIN 3.9 4.2   No results for input(s): LIPASE, AMYLASE in the last 168 hours. No results for input(s): AMMONIA in the last 168 hours. Diabetic: No results for input(s): HGBA1C in the last 72 hours. Recent Labs  Lab 04/18/19 0753 04/18/19 1218 04/18/19 1641 04/18/19 2144 04/19/19 0806  GLUCAP 97 109* 115* 106* 105*   Cardiac Enzymes: No results for input(s): CKTOTAL, CKMB, CKMBINDEX, TROPONINI in the last 168 hours. No results for input(s): PROBNP in the last 8760 hours. Coagulation Profile: No results for input(s): INR, PROTIME in the last 168 hours. Thyroid Function Tests: No results for input(s): TSH, T4TOTAL, FREET4, T3FREE, THYROIDAB in the last 72 hours. Lipid Profile: No results for input(s): CHOL, HDL, LDLCALC, TRIG, CHOLHDL, LDLDIRECT in the last 72 hours. Anemia Panel: No results for input(s): VITAMINB12, FOLATE, FERRITIN, TIBC, IRON, RETICCTPCT in the last 72 hours. Urine analysis:    Component Value Date/Time   COLORURINE YELLOW 04/17/2019 1352   APPEARANCEUR CLOUDY (A) 04/17/2019 1352    LABSPEC 1.021 04/17/2019 1352   LABSPEC 1.015 08/21/2018 1154   PHURINE 5.0 04/17/2019 1352   GLUCOSEU NEGATIVE 04/17/2019 1352   HGBUR SMALL (A) 04/17/2019 1352   BILIRUBINUR NEGATIVE 04/17/2019 1352   BILIRUBINUR negative 08/21/2018 1154   BILIRUBINUR n 06/02/2016 1251   KETONESUR 80 (A) 04/17/2019 1352   PROTEINUR NEGATIVE 04/17/2019 1352   UROBILINOGEN negative 06/02/2016 1251   UROBILINOGEN 1.0 02/19/2014 1849   NITRITE NEGATIVE 04/17/2019 1352   LEUKOCYTESUR SMALL (A) 04/17/2019 1352   Sepsis Labs: Invalid input(s): PROCALCITONIN, LACTICIDVEN  Microbiology: Recent Results (from the past  240 hour(s))  Respiratory Panel by RT PCR (Flu A&B, Covid) - Nasopharyngeal Swab     Status: None   Collection Time: 04/16/19  2:11 AM   Specimen: Nasopharyngeal Swab  Result Value Ref Range Status   SARS Coronavirus 2 by RT PCR NEGATIVE NEGATIVE Final    Comment: (NOTE) SARS-CoV-2 target nucleic acids are NOT DETECTED. The SARS-CoV-2 RNA is generally detectable in upper respiratoy specimens during the acute phase of infection. The lowest concentration of SARS-CoV-2 viral copies this assay can detect is 131 copies/mL. A negative result does not preclude SARS-Cov-2 infection and should not be used as the sole basis for treatment or other patient management decisions. A negative result may occur with  improper specimen collection/handling, submission of specimen other than nasopharyngeal swab, presence of viral mutation(s) within the areas targeted by this assay, and inadequate number of viral copies (<131 copies/mL). A negative result must be combined with clinical observations, patient history, and epidemiological information. The expected result is Negative. Fact Sheet for Patients:  https://www.moore.com/ Fact Sheet for Healthcare Providers:  https://www.young.biz/ This test is not yet ap proved or cleared by the [place] FDA and  has been  authorized for detection and/or diagnosis of SARS-CoV-2 by FDA under an Emergency Use Authorization (EUA). This EUA will remain  in effect (meaning this test can be used) for the duration of the COVID-19 declaration under Section 564(b)(1) of the Act, 21 U.S.C. section 360bbb-3(b)(1), unless the authorization is terminated or revoked sooner.    Influenza A by PCR NEGATIVE NEGATIVE Final   Influenza B by PCR NEGATIVE NEGATIVE Final    Comment: (NOTE) The Xpert Xpress SARS-CoV-2/FLU/RSV assay is intended as an aid in  the diagnosis of influenza from Nasopharyngeal swab specimens and  should not be used as a sole basis for treatment. Nasal washings and  aspirates are unacceptable for Xpert Xpress SARS-CoV-2/FLU/RSV  testing. Fact Sheet for Patients: https://www.moore.com/ Fact Sheet for Healthcare Providers: https://www.young.biz/ This test is not yet approved or cleared by the [place] FDA and  has been authorized for detection and/or diagnosis of SARS-CoV-2 by  FDA under an Emergency Use Authorization (EUA). This EUA will remain  in effect (meaning this test can be used) for the duration of the  Covid-19 declaration under Section 564(b)(1) of the Act, 21  U.S.C. section 360bbb-3(b)(1), unless the authorization is  terminated or revoked. Performed at [facility], [address] [place], [place] [address]     Radiology Studies: No results found.    Carizma Dunsworth T. Anjelique Makar [facility]  If 7PM-7AM, please contact night-coverage [email or web address] Password [email or web address] 04/19/2019, 11:57 AM

## 2019-04-19 NOTE — Consult Note (Addendum)
[facility] Face-to-Face Psychiatry Consult   Reason for Consult: "IVC" Referring Physician: Dr. Alanda Slim Patient Identification: Dave Mannes MRN:  [id number] Principal Diagnosis: Hypokalemia Diagnosis:  Principal Problem:   Hypokalemia Active Problems:   Essential hypertension, benign   Noncompliance   Involuntary commitment   Schizophrenia (HCC)   AKI (acute kidney injury) (HCC)   Encephalopathy   Total Time spent with patient: 30 minutes  Subjective:   Maralee Higuchi is a 59 y.o. female patient.  Patient assessed by nurse practitioner.  Patient unable to participate in assessment.  Patient refuses to verbally respond throughout most of assessment.  Patient appears to be responding to internal stimuli at this time.  Patient observed looking toward corner of room when no one is present.  Patient appears to be thought blocking.  Patient appears paranoid, pulls blanket over her face during assessment. Patient denies suicidal and homicidal ideations.  Patient denies visual hallucinations.  Patient endorses auditory hallucinations.  Patient shakes head no when asked if hallucinations are command in nature.  Patient unwilling to participate further in assessment. Per patient's registered nurse patient refuses to eat or drink. Per nurse and chart patient may be more willing to participate in care when husband at bedside. HPI: Patient admitted under involuntary commitment, history of schizophrenia and depression.  Patient also displays elevated blood pressure upon admission.  Past Psychiatric History: Paranoid schizophrenia, major depressive disorder  Risk to Self: Suicidal Ideation: (UTA) Suicidal Intent: (UTA) Is patient at risk for suicide?: (UTA) Suicidal Plan?: (UTA) Access to Means: (UTA) What has been your use of drugs/alcohol within the last 12 months?: UTA How many times?: (UTA) Other Self Harm Risks: Pt has been out of her medication since roughly Dec 30 Triggers for Past Attempts:  (UTA) Intentional Self Injurious Behavior: (UTA) Risk to Others: Homicidal Ideation: (UTA) Thoughts of Harm to Others: (UTA) Current Homicidal Intent: (UTA) Current Homicidal Plan: (UTA) Access to Homicidal Means: (UTA) Identified Victim: UTA History of harm to others?: (UTA) Assessment of Violence: (UTA) Violent Behavior Description: UTA Does patient have access to weapons?: (UTA) Criminal Charges Pending?: (UTA) Does patient have a court date: (UTA) Prior Inpatient Therapy: Prior Inpatient Therapy: Yes Prior Therapy Dates: 1998, 2010 - 1-2x/year every year after that Prior Therapy Facilty/Provider(s): [facility] Reason for Treatment: Schizophrenia Prior Outpatient Therapy: Prior Outpatient Therapy: No Does patient have an ACCT team?: No Does patient have Intensive In-House Services?  : No Does patient have [facility] services? : Yes Does patient have P4CC services?: No  Past Medical History:  Past Medical History:  Diagnosis Date  . Depression    Dr. Mila Homer, [facility]; hospitalization 07/2010  . History of echocardiogram 2011   [facility]  . History of renal stone   . Hypertension   . Hypokalemia   . Nephrolithiasis   . Obesity   . Schizophrenia (HCC)   . Wears glasses     Past Surgical History:  Procedure Laterality Date  . WISDOM TOOTH EXTRACTION     Family History:  Family History  Problem Relation Age of Onset  . Diabetes Mother   . Heart disease Mother   . Chronic Renal Failure Mother   . Cancer Maternal Uncle        lung  . Kidney disease Mother        dialysis  . Stroke Neg Hx   . Hypertension Neg Hx   . Hyperlipidemia Neg Hx    Family Psychiatric  History: Unknown Social History:  Social History  Substance and Sexual Activity  Alcohol Use No     Social History   Substance and Sexual Activity  Drug Use No    Social History   Socioeconomic History  . Marital status: Married    Spouse name: Not on file  . Number of children: 2  . Years of  education: 63  . Highest education level: Not on file  Occupational History  . Not on file  Tobacco Use  . Smoking status: Never Smoker  . Smokeless tobacco: Never Used  Substance and Sexual Activity  . Alcohol use: No  . Drug use: No  . Sexual activity: Not Currently  Other Topics Concern  . Not on file  Social History Narrative   ** Merged History Encounter **       Married, 2 children, mother in Social worker lives with them, was working as a Scientist, research (medical) Strain:   . Difficulty of Paying Living Expenses: Not on file  Food Insecurity:   . Worried About Programme researcher, broadcasting/film/video in the Last Year: Not on file  . Ran Out of Food in the Last Year: Not on file  Transportation Needs:   . Lack of Transportation (Medical): Not on file  . Lack of Transportation (Non-Medical): Not on file  Physical Activity:   . Days of Exercise per Week: Not on file  . Minutes of Exercise per Session: Not on file  Stress:   . Feeling of Stress : Not on file  Social Connections:   . Frequency of Communication with Friends and Family: Not on file  . Frequency of Social Gatherings with Friends and Family: Not on file  . Attends Religious Services: Not on file  . Active Member of Clubs or Organizations: Not on file  . Attends Banker Meetings: Not on file  . Marital Status: Not on file   Additional Social History:    Allergies:   Allergies  Allergen Reactions  . Paliperidone Other (See Comments)    Angioedema, drooling, slurred speech, ptosis  . Lisinopril Rash and Other (See Comments)    Angioedema   . Sulfa Antibiotics Itching    Labs:  Results for orders placed or performed during the hospital encounter of 04/15/19 (from the past 48 hour(s))  Glucose, capillary     Status: Abnormal   Collection Time: 04/17/19  4:15 PM  Result Value Ref Range   Glucose-Capillary 58 (L) 70 - 99 mg/dL  Glucose, capillary     Status: None   Collection  Time: 04/17/19  9:33 PM  Result Value Ref Range   Glucose-Capillary 99 70 - 99 mg/dL  Glucose, capillary     Status: None   Collection Time: 04/18/19  7:53 AM  Result Value Ref Range   Glucose-Capillary 97 70 - 99 mg/dL  Glucose, capillary     Status: Abnormal   Collection Time: 04/18/19 12:18 PM  Result Value Ref Range   Glucose-Capillary 109 (H) 70 - 99 mg/dL  Basic metabolic panel     Status: Abnormal   Collection Time: 04/18/19 12:30 PM  Result Value Ref Range   Sodium 146 (H) 135 - 145 mmol/L   Potassium 3.5 3.5 - 5.1 mmol/L   Chloride 111 98 - 111 mmol/L   CO2 26 22 - 32 mmol/L   Glucose, Bld 126 (H) 70 - 99 mg/dL   BUN 14 6 - 20 mg/dL   Creatinine, Ser 2.02 0.44 - 1.00  mg/dL   Calcium 8.9 8.9 - 40.9 mg/dL   GFR calc non Af Amer >60 >60 mL/min   GFR calc Af Amer >60 >60 mL/min   Anion gap 9 5 - 15    Comment: Performed at [facility], [address], [place], [place] [address]  Magnesium     Status: None   Collection Time: 04/18/19 12:30 PM  Result Value Ref Range   Magnesium 1.9 1.7 - 2.4 mg/dL    Comment: Performed at [facility], [address], [place], [place] [address]  CBC     Status: None   Collection Time: 04/18/19 12:30 PM  Result Value Ref Range   WBC 8.2 4.0 - 10.5 K/uL   RBC 4.92 3.87 - 5.11 MIL/uL   Hemoglobin 14.2 12.0 - 15.0 g/dL   HCT 56.2 13.0 - 86.5 %   MCV 90.9 80.0 - 100.0 fL   MCH 28.9 26.0 - 34.0 pg   MCHC 31.8 30.0 - 36.0 g/dL   RDW 78.4 69.6 - 29.5 %   Platelets 333 150 - 400 K/uL   nRBC 0.0 0.0 - 0.2 %    Comment: Performed at [facility], [address] [place], [place] [address]  Glucose, capillary     Status: Abnormal   Collection Time: 04/18/19  4:41 PM  Result Value Ref Range   Glucose-Capillary 115 (H) 70 - 99 mg/dL   Comment 1 Notify RN    Comment 2 Document in Chart   Glucose, capillary     Status: Abnormal   Collection Time: 04/18/19  9:44 PM  Result Value Ref Range   Glucose-Capillary 106 (H) 70 - 99  mg/dL  Basic metabolic panel     Status: Abnormal   Collection Time: 04/19/19  4:14 AM  Result Value Ref Range   Sodium 142 135 - 145 mmol/L   Potassium 3.3 (L) 3.5 - 5.1 mmol/L   Chloride 106 98 - 111 mmol/L   CO2 26 22 - 32 mmol/L   Glucose, Bld 130 (H) 70 - 99 mg/dL   BUN 12 6 - 20 mg/dL   Creatinine, Ser 2.44 0.44 - 1.00 mg/dL   Calcium 8.8 (L) 8.9 - 10.3 mg/dL   GFR calc non Af Amer >60 >60 mL/min   GFR calc Af Amer >60 >60 mL/min   Anion gap 10 5 - 15    Comment: Performed at [facility], [address] [place], [place] [address]  Magnesium     Status: None   Collection Time: 04/19/19  4:14 AM  Result Value Ref Range   Magnesium 1.7 1.7 - 2.4 mg/dL    Comment: Performed at [facility], [address] [place], [place] [address]  Glucose, capillary     Status: Abnormal   Collection Time: 04/19/19  8:06 AM  Result Value Ref Range   Glucose-Capillary 105 (H) 70 - 99 mg/dL  Glucose, capillary     Status: None   Collection Time: 04/19/19 12:44 PM  Result Value Ref Range   Glucose-Capillary 85 70 - 99 mg/dL    Current Facility-Administered Medications  Medication Dose Route Frequency Provider Last Rate Last Admin  . acetaminophen (TYLENOL) tablet 650 mg  650 mg Oral Q6H PRN Pearson Grippe, MD       Or  . acetaminophen (TYLENOL) suppository 650 mg  650 mg Rectal Q6H PRN Pearson Grippe, MD      . amLODipine (NORVASC) tablet 10  mg  10 mg Oral Daily Gonfa, Taye T, MD      . dextrose 5 % and 0.45 % NaCl with KCl 40 mEq/L infusion   Intravenous Continuous Mercy Riding, MD 75 mL/hr at 04/19/19 0821 New Bag at 04/19/19 0177  . dextrose 50 % solution 25 mL  25 mL Intravenous Q4H PRN Eliseo Squires, Jessica U, DO      . enoxaparin (LOVENOX) injection 40 mg  40 mg Subcutaneous Daily Vann, Jessica U, DO   40 mg at 04/16/19 1046  . FLUoxetine (PROZAC) capsule 20 mg  20 mg Oral Daily Eulogio Bear U, DO   20 mg at 04/18/19 0949  . hydrALAZINE (APRESOLINE) injection 10 mg  10 mg Intravenous  Q6H PRN Eulogio Bear U, DO   10 mg at 04/17/19 0941  . OLANZapine zydis (ZYPREXA) disintegrating tablet 10 mg  10 mg Oral BID Eulogio Bear U, DO   10 mg at 04/19/19 9390    Musculoskeletal: Strength & Muscle Tone: within normal limits Gait & Station: Unable to assess Patient leans: Unable to assess  Psychiatric Specialty Exam: Physical Exam  Nursing note and vitals reviewed. Constitutional: She appears well-developed.  HENT:  Head: Normocephalic.  Cardiovascular: Normal rate.  Respiratory: Effort normal.  Neurological: She is alert.  Psychiatric: Her mood appears anxious. Her speech is delayed. She is withdrawn. Thought content is paranoid. Cognition and memory are impaired. She expresses inappropriate judgment.    Review of Systems  Constitutional: Negative.   HENT: Negative.   Eyes: Negative.   Respiratory: Negative.   Cardiovascular: Negative.   Gastrointestinal: Negative.   Genitourinary: Negative.   Musculoskeletal: Negative.   Skin: Negative.   Neurological: Negative.   Psychiatric/Behavioral: The patient is nervous/anxious.     Blood pressure (!) 145/93, pulse 78, temperature 98.9 F (37.2 C), temperature source Axillary, resp. rate 19, weight 76.8 kg, SpO2 100 %.Body mass index is 24.29 kg/m.  General Appearance: Disheveled  Eye Contact:  Minimal  Speech:  Slow and Minimal speech  Volume:  Unable to assess  Mood:  Anxious  Affect:  Tearful  Thought Process:  Disorganized and Descriptions of Associations: Loose  Orientation:  Other:  Unable to assess  Thought Content:  Hallucinations: Auditory  Suicidal Thoughts:  No  Homicidal Thoughts:  No  Memory:  Unable to assess  Judgement:  Impaired  Insight:  Lacking  Psychomotor Activity:  Normal  Concentration:  Concentration: Poor  Recall:  Fair  Fund of Knowledge:  Fair  Language:  Fair  Akathisia:  NA  Handed:  Right  AIMS (if indicated):     Assets:  Communication Skills Desire for  Improvement Financial Resources/Insurance Housing Leisure Time Physical Health Resilience  ADL's:  Impaired  Cognition:  Impaired,  Mild  Sleep:        Treatment Plan Summary: Case discussed with Dr. Parke Poisson. Daily contact with patient to assess and evaluate symptoms and progress in treatment  Continue to recommend inpatient psychiatric treatment once medically clear.  Continue Zyprexa current dose.   Disposition: Recommend psychiatric Inpatient admission when medically cleared. Supportive therapy provided about ongoing stressors.  Emmaline Kluver, FNP 04/19/2019 2:27 PM   Attest to NP Note

## 2019-04-20 DIAGNOSIS — Z5320 Procedure and treatment not carried out because of patient's decision for unspecified reasons: Secondary | ICD-10-CM

## 2019-04-20 DIAGNOSIS — Z5329 Procedure and treatment not carried out because of patient's decision for other reasons: Secondary | ICD-10-CM

## 2019-04-20 LAB — VITAMIN B12: Vitamin B-12: 354 pg/mL (ref 180–914)

## 2019-04-20 LAB — RENAL FUNCTION PANEL
Albumin: 3.7 g/dL (ref 3.5–5.0)
Anion gap: 12 (ref 5–15)
BUN: 7 mg/dL (ref 6–20)
CO2: 24 mmol/L (ref 22–32)
Calcium: 9.4 mg/dL (ref 8.9–10.3)
Chloride: 105 mmol/L (ref 98–111)
Creatinine, Ser: 0.76 mg/dL (ref 0.44–1.00)
GFR calc Af Amer: >60 mL/min (ref >60)
GFR calc non Af Amer: >60 mL/min (ref >60)
Glucose, Bld: 128 mg/dL — ABNORMAL HIGH (ref 70–99)
Phosphorus: 3.7 mg/dL (ref 2.5–4.6)
Potassium: 4.3 mmol/L (ref 3.5–5.1)
Sodium: 141 mmol/L (ref 135–145)

## 2019-04-20 LAB — RAPID HIV SCREEN (HIV 1/2 AB+AG)
HIV 1/2 Antibodies: NONREACTIVE
HIV-1 P24 Antigen - HIV24: NONREACTIVE

## 2019-04-20 LAB — HEMOGLOBIN A1C
Hgb A1c MFr Bld: 5.8 % — ABNORMAL HIGH (ref 4.8–5.6)
Mean Plasma Glucose: 119.76 mg/dL

## 2019-04-20 LAB — TSH: TSH: 2.18 u[IU]/mL (ref 0.350–4.500)

## 2019-04-20 LAB — GLUCOSE, CAPILLARY
Glucose-Capillary: 153 mg/dL — ABNORMAL HIGH (ref 70–99)
Glucose-Capillary: 87 mg/dL (ref 70–99)
Glucose-Capillary: 93 mg/dL (ref 70–99)

## 2019-04-20 LAB — MAGNESIUM: Magnesium: 1.6 mg/dL — ABNORMAL LOW (ref 1.7–2.4)

## 2019-04-20 LAB — AMMONIA: Ammonia: 36 umol/L — ABNORMAL HIGH (ref 9–35)

## 2019-04-20 MED ORDER — CLONIDINE HCL 0.2 MG/24HR TD PTWK
0.2000 mg | MEDICATED_PATCH | TRANSDERMAL | Status: DC
  Administered 2019-04-20: 0.2 mg via TRANSDERMAL
  Filled 2019-04-20: qty 1

## 2019-04-20 MED ORDER — MAGNESIUM SULFATE 2 GM/50ML IV SOLN
2.0000 g | Freq: Once | INTRAVENOUS | Status: DC
  Filled 2019-04-20: qty 50

## 2019-04-20 NOTE — Progress Notes (Signed)
Patient IV access leaking at start of shift. Noted to be totally out. Had offered to place new IV access. Pt refused, states " No! I don't want to".

## 2019-04-20 NOTE — Plan of Care (Signed)
  Problem: Elimination: Goal: Will not experience complications related to bowel motility Outcome: Progressing Goal: Will not experience complications related to urinary retention Outcome: Progressing   Problem: Pain Managment: Goal: General experience of comfort will improve Outcome: Progressing   

## 2019-04-20 NOTE — Progress Notes (Addendum)
Dr. Alanda Slim updated about IV team's  attempt to restart her IV access but she has refused.   Had attempted to offer/give  her medication again including her clonidine and had attempted to talk her into getting an IV access to no avail. Will continue to monitor .

## 2019-04-20 NOTE — Progress Notes (Signed)
PROGRESS NOTE  Amy Casey [facility]:[id number] DOB: 10-27-1960   PCP: Patient, No Pcp Per  Patient is from: Home.  DOA: 04/15/2019 LOS: 4  Brief Narrative / Interim history: 59 year old female with history of HTN, depression and schizophrenia sent to ER from [facility] for elevated blood pressure.  Reportedly has been of care site medications for sometimes and taken to [facility] by family members.  Noted to have elevated blood pressure and sent to the ED.  In ED, she was noted to have elevated blood pressure, hypokalemia and AKI for which she was admitted.  IVC paperwork was completed and psychiatry was consulted.   Psychiatry made adjustments to his psych medications and recommended inpatient psych when medically stable.  Subjective: Patient continues to refuse care and medications.  She says "I am fine".  Not willing to answer more questions.  She also refuses physical exam.  She is somewhat apprehensive.  Objective: Vitals:   04/19/19 1946 04/20/19 0340 04/20/19 0740 04/20/19 1204  BP:  (!) 154/90 (!) 172/88 (!) 159/92  Pulse: 76 76 78 76  Resp: 18 20 14 18   Temp: 98.8 F (37.1 C) 97.9 F (36.6 C) 98.2 F (36.8 C) 98.1 F (36.7 C)  TempSrc: Oral Oral Oral Oral  SpO2: 100% 100% 100% 100%  Weight:        Intake/Output Summary (Last 24 hours) at 04/20/2019 1302 Last data filed at 04/20/2019 0815 Gross per 24 hour  Intake 1382.64 ml  Output 1600 ml  Net -217.36 ml   Filed Weights   04/18/19 0500 04/19/19 0523  Weight: 76.3 kg 76.8 kg    Examination:  GENERAL: No apparent distress.  Somewhat apprehensive. HEENT: MMM.  Vision and hearing grossly intact.  RESP: On room air.  No notable increased work of breathing. CVS: Vitals within normal.  Refused further exam. MSK/EXT:  Moves extremities. No apparent deformity. SKIN: no apparent skin lesion or wound NEURO: Awake, alert and oriented.  No apparent focal neuro deficit. PSYCH: Somewhat apprehensive.  May be anxious.  Not  engaging.  Procedures:  None  Assessment & Plan: Essential hypertension: BP slightly elevated -Continue amlodipine to 10 mg daily (home dose). -Given history of angioedema with ACEI and noncompliance, will use clonidine patch 0.2 mg -Continue as needed hydralazine  Schizophrenia/major depressive disorder: Continues to decline p.o. intake including medications.  However, she took some last night with the help of a husband.  Similar presentation in the past when hospitalized at [facility] and discharged on Prozac and Zyprexa. -IVCd in ED -Continue safety sitter -Zyprexa 10 mg twice daily and Prozac 20 mg daily. -Psychiatry following.  AKI: Likely prerenal.  Resolved.  Hypernatremia: Resolved. -Discontinue IV fluid  Hypokalemia: Resolved. -Discontinue IV fluid  Hypomagnesemia -IV magnesium sulfate  Pyuria/bacteriuria: difficult to interpret as patient does not provide history.  Has no leukocytosis or objective fevers to suggest UTI.  No suprapubic tenderness. -Continue monitoring off antibiotics               DVT prophylaxis: Subcu heparin Code Status: Full code Family Communication: Patient and/or RN. Available if any question. Disposition Plan: Remains inpatient for hypokalemia.  Final disposition inpatient psych. Consultants: Psychiatry   Microbiology summarized: COVID-19 negative. Influenza PCR negative.  Sch Meds:  Scheduled Meds: . amLODipine  10 mg Oral Daily  . cloNIDine  0.2 mg Transdermal Q Sat  . enoxaparin (LOVENOX) injection  40 mg Subcutaneous Daily  . FLUoxetine  20 mg Oral Daily  . OLANZapine zydis  10 mg Oral  BID   Continuous Infusions: . magnesium sulfate bolus IVPB     PRN Meds:.acetaminophen **OR** acetaminophen, dextrose, hydrALAZINE  Antimicrobials: Anti-infectives (From admission, onward)   None       I have personally reviewed the following labs and images: CBC: Recent Labs  Lab 04/16/19 0219 04/16/19 0630 04/18/19 1230  WBC  6.7 8.1 8.2  NEUTROABS 4.4  --   --   HGB 14.6 15.9* 14.2  HCT 44.7 49.5* 44.7  MCV 88.9 89.8 90.9  PLT 369 359 333   BMP &GFR Recent Labs  Lab 04/16/19 0259 04/16/19 0630 04/17/19 0251 04/17/19 1246 04/18/19 1230 04/19/19 0414 04/20/19 0654  NA  --    < > 144 146* 146* 142 141  K  --    < > 2.7* 3.6 3.5 3.3* 4.3  CL  --    < > 107 108 111 106 105  CO2  --    < > 24 22 26 26 24   GLUCOSE  --    < > 76 73 126* 130* 128*  BUN  --    < > 23* 21* 14 12 7   CREATININE  --    < > 1.07* 1.08* 0.83 0.85 0.76  CALCIUM  --    < > 8.5* 9.0 8.9 8.8* 9.4  MG 2.3  --  2.1  --  1.9 1.7 1.6*  PHOS  --   --   --   --   --   --  3.7   < > = values in this interval not displayed.   Estimated Creatinine Clearance: 80.9 mL/min (by C-G formula based on SCr of 0.76 mg/dL). Liver & Pancreas: Recent Labs  Lab 04/16/19 0219 04/16/19 0630 04/20/19 0654  AST 19 21  --   ALT 22 24  --   ALKPHOS 62 68  --   BILITOT 1.4* 1.2  --   PROT 6.9 7.8  --   ALBUMIN 3.9 4.2 3.7   No results for input(s): LIPASE, AMYLASE in the last 168 hours. Recent Labs  Lab 04/20/19 0654  AMMONIA 36*   Diabetic: Recent Labs    04/20/19 0654  HGBA1C 5.8*   Recent Labs  Lab 04/19/19 1244 04/19/19 1612 04/19/19 2142 04/20/19 0746 04/20/19 1159  GLUCAP 85 128* 148* 153* 87   Cardiac Enzymes: No results for input(s): CKTOTAL, CKMB, CKMBINDEX, TROPONINI in the last 168 hours. No results for input(s): PROBNP in the last 8760 hours. Coagulation Profile: No results for input(s): INR, PROTIME in the last 168 hours. Thyroid Function Tests: Recent Labs    04/20/19 0654  TSH 2.180   Lipid Profile: No results for input(s): CHOL, HDL, LDLCALC, TRIG, CHOLHDL, LDLDIRECT in the last 72 hours. Anemia Panel: Recent Labs    04/20/19 0654  VITAMINB12 354   Urine analysis:    Component Value Date/Time   COLORURINE YELLOW 04/17/2019 1352   APPEARANCEUR CLOUDY (A) 04/17/2019 1352   LABSPEC 1.021 04/17/2019  1352   LABSPEC 1.015 08/21/2018 1154   PHURINE 5.0 04/17/2019 1352   GLUCOSEU NEGATIVE 04/17/2019 1352   HGBUR SMALL (A) 04/17/2019 1352   BILIRUBINUR NEGATIVE 04/17/2019 1352   BILIRUBINUR negative 08/21/2018 1154   BILIRUBINUR n 06/02/2016 1251   KETONESUR 80 (A) 04/17/2019 1352   PROTEINUR NEGATIVE 04/17/2019 1352   UROBILINOGEN negative 06/02/2016 1251   UROBILINOGEN 1.0 02/19/2014 1849   NITRITE NEGATIVE 04/17/2019 1352   LEUKOCYTESUR SMALL (A) 04/17/2019 1352   Sepsis Labs: Invalid input(s): PROCALCITONIN, LACTICIDVEN  Microbiology: Recent Results (from the past 240 hour(s))  Respiratory Panel by RT PCR (Flu A&B, Covid) - Nasopharyngeal Swab     Status: None   Collection Time: 04/16/19  2:11 AM   Specimen: Nasopharyngeal Swab  Result Value Ref Range Status   SARS Coronavirus 2 by RT PCR NEGATIVE NEGATIVE Final    Comment: (NOTE) SARS-CoV-2 target nucleic acids are NOT DETECTED. The SARS-CoV-2 RNA is generally detectable in upper respiratoy specimens during the acute phase of infection. The lowest concentration of SARS-CoV-2 viral copies this assay can detect is 131 copies/mL. A negative result does not preclude SARS-Cov-2 infection and should not be used as the sole basis for treatment or other patient management decisions. A negative result may occur with  improper specimen collection/handling, submission of specimen other than nasopharyngeal swab, presence of viral mutation(s) within the areas targeted by this assay, and inadequate number of viral copies (<131 copies/mL). A negative result must be combined with clinical observations, patient history, and epidemiological information. The expected result is Negative. Fact Sheet for Patients:  PinkCheek.be Fact Sheet for Healthcare Providers:  GravelBags.it This test is not yet ap proved or cleared by the [place] FDA and  has been authorized for detection  and/or diagnosis of SARS-CoV-2 by FDA under an Emergency Use Authorization (EUA). This EUA will remain  in effect (meaning this test can be used) for the duration of the COVID-19 declaration under Section 564(b)(1) of the Act, 21 U.S.C. section 360bbb-3(b)(1), unless the authorization is terminated or revoked sooner.    Influenza A by PCR NEGATIVE NEGATIVE Final   Influenza B by PCR NEGATIVE NEGATIVE Final    Comment: (NOTE) The Xpert Xpress SARS-CoV-2/FLU/RSV assay is intended as an aid in  the diagnosis of influenza from Nasopharyngeal swab specimens and  should not be used as a sole basis for treatment. Nasal washings and  aspirates are unacceptable for Xpert Xpress SARS-CoV-2/FLU/RSV  testing. Fact Sheet for Patients: PinkCheek.be Fact Sheet for Healthcare Providers: GravelBags.it This test is not yet approved or cleared by the [place] FDA and  has been authorized for detection and/or diagnosis of SARS-CoV-2 by  FDA under an Emergency Use Authorization (EUA). This EUA will remain  in effect (meaning this test can be used) for the duration of the  Covid-19 declaration under Section 564(b)(1) of the Act, 21  U.S.C. section 360bbb-3(b)(1), unless the authorization is  terminated or revoked. Performed at [facility], [place] [address], [place], [place] [address]     Radiology Studies: No results found.    Taye T. Palmer  If 7PM-7AM, please contact night-coverage [email or web address] Password [email or web address] 04/20/2019, 1:02 PM

## 2019-04-20 NOTE — Progress Notes (Signed)
Pt had burst of SVT on tele while lying in bed.  Pt reports left sided CP, BP 142/98, O2 sat 98% on RA.  Explained to patient again the need to supplement magnesium, explained ntg and EKG and patient refused both.  Pt currently lying in bed, HR 98-100 NSR. [facility] NP paged and awaiting further orders.

## 2019-04-20 NOTE — Consult Note (Addendum)
[facility] Face-to-Face Psychiatry Consult   Reason for Consult: "IVC" Referring Physician: Dr. Alanda Slim Patient Identification: Amy Casey MRN:  [id number] Principal Diagnosis: Hypokalemia Diagnosis:  Principal Problem:   Hypokalemia Active Problems:   Essential hypertension, benign   Noncompliance   Involuntary commitment   Schizophrenia (HCC)   AKI (acute kidney injury) (HCC)   Encephalopathy   Total Time spent with patient: 30 minutes  Subjective:   04/19/2018 patient assessed by nurse practitioner.  Patient appears more alert and willing to participate in interview today than yesterday.  Patient oriented to self place and time.  Patient denies suicidal and homicidal ideations.  Patient reports feelings of paranoia.  Patient denies auditory and visual hallucinations.  Patient reports she slept "okay."  Patient answers assessment questions with one-word only.  Patient appears to continue to be responding to internal stimuli.  Amy Casey is a 59 y.o. female patient.  Patient assessed by nurse practitioner.  Patient unable to participate in assessment.  Patient refuses to verbally respond throughout most of assessment.  Patient appears to be responding to internal stimuli at this time.  Patient observed looking toward corner of room when no one is present.  Patient appears to be thought blocking.  Patient appears paranoid, pulls blanket over her face during assessment. Patient denies suicidal and homicidal ideations.  Patient denies visual hallucinations.  Patient endorses auditory hallucinations.  Patient shakes head no when asked if hallucinations are command in nature.  Patient unwilling to participate further in assessment. Per patient's registered nurse patient refuses to eat or drink. Per nurse and chart patient may be more willing to participate in care when husband at bedside. HPI: Patient admitted under involuntary commitment, history of schizophrenia and depression.  Patient also  displays elevated blood pressure upon admission.  Past Psychiatric History: Paranoid schizophrenia, major depressive disorder  Risk to Self: Suicidal Ideation: (UTA) Suicidal Intent: (UTA) Is patient at risk for suicide?: (UTA) Suicidal Plan?: (UTA) Access to Means: (UTA) What has been your use of drugs/alcohol within the last 12 months?: UTA How many times?: (UTA) Other Self Harm Risks: Pt has been out of her medication since roughly Dec 30 Triggers for Past Attempts: (UTA) Intentional Self Injurious Behavior: (UTA) Risk to Others: Homicidal Ideation: (UTA) Thoughts of Harm to Others: (UTA) Current Homicidal Intent: (UTA) Current Homicidal Plan: (UTA) Access to Homicidal Means: (UTA) Identified Victim: UTA History of harm to others?: (UTA) Assessment of Violence: (UTA) Violent Behavior Description: UTA Does patient have access to weapons?: (UTA) Criminal Charges Pending?: (UTA) Does patient have a court date: (UTA) Prior Inpatient Therapy: Prior Inpatient Therapy: Yes Prior Therapy Dates: 1998, 2010 - 1-2x/year every year after that Prior Therapy Facilty/Provider(s): [facility] Reason for Treatment: Schizophrenia Prior Outpatient Therapy: Prior Outpatient Therapy: No Does patient have an ACCT team?: No Does patient have Intensive In-House Services?  : No Does patient have [facility] services? : Yes Does patient have P4CC services?: No  Past Medical History:  Past Medical History:  Diagnosis Date  . Depression    Dr. Mila Homer, [facility]; hospitalization 07/2010  . History of echocardiogram 2011   [facility]  . History of renal stone   . Hypertension   . Hypokalemia   . Nephrolithiasis   . Obesity   . Schizophrenia (HCC)   . Wears glasses     Past Surgical History:  Procedure Laterality Date  . WISDOM TOOTH EXTRACTION     Family History:  Family History  Problem Relation Age of Onset  .  Diabetes Mother   . Heart disease Mother   . Chronic Renal Failure Mother   .  Cancer Maternal Uncle        lung  . Kidney disease Mother        dialysis  . Stroke Neg Hx   . Hypertension Neg Hx   . Hyperlipidemia Neg Hx    Family Psychiatric  History: Unknown Social History:  Social History   Substance and Sexual Activity  Alcohol Use No     Social History   Substance and Sexual Activity  Drug Use No    Social History   Socioeconomic History  . Marital status: Married    Spouse name: Not on file  . Number of children: 2  . Years of education: 1012  . Highest education level: Not on file  Occupational History  . Not on file  Tobacco Use  . Smoking status: Never Smoker  . Smokeless tobacco: Never Used  Substance and Sexual Activity  . Alcohol use: No  . Drug use: No  . Sexual activity: Not Currently  Other Topics Concern  . Not on file  Social History Narrative   ** Merged History Encounter **       Married, 2 children, mother in Social workerlaw lives with them, was working as a Scientist, research (medical)waitress   Social Determinants of Health   Financial Resource Strain:   . Difficulty of Paying Living Expenses: Not on file  Food Insecurity:   . Worried About Programme researcher, broadcasting/film/videounning Out of Food in the Last Year: Not on file  . Ran Out of Food in the Last Year: Not on file  Transportation Needs:   . Lack of Transportation (Medical): Not on file  . Lack of Transportation (Non-Medical): Not on file  Physical Activity:   . Days of Exercise per Week: Not on file  . Minutes of Exercise per Session: Not on file  Stress:   . Feeling of Stress : Not on file  Social Connections:   . Frequency of Communication with Friends and Family: Not on file  . Frequency of Social Gatherings with Friends and Family: Not on file  . Attends Religious Services: Not on file  . Active Member of Clubs or Organizations: Not on file  . Attends BankerClub or Organization Meetings: Not on file  . Marital Status: Not on file   Additional Social History:    Allergies:   Allergies  Allergen Reactions  . Paliperidone  Other (See Comments)    Angioedema, drooling, slurred speech, ptosis  . Lisinopril Rash and Other (See Comments)    Angioedema   . Sulfa Antibiotics Itching    Labs:  Results for orders placed or performed during the hospital encounter of 04/15/19 (from the past 48 hour(s))  Glucose, capillary     Status: Abnormal   Collection Time: 04/18/19  4:41 PM  Result Value Ref Range   Glucose-Capillary 115 (H) 70 - 99 mg/dL   Comment 1 Notify RN    Comment 2 Document in Chart   Glucose, capillary     Status: Abnormal   Collection Time: 04/18/19  9:44 PM  Result Value Ref Range   Glucose-Capillary 106 (H) 70 - 99 mg/dL  Basic metabolic panel     Status: Abnormal   Collection Time: 04/19/19  4:14 AM  Result Value Ref Range   Sodium 142 135 - 145 mmol/L   Potassium 3.3 (L) 3.5 - 5.1 mmol/L   Chloride 106 98 - 111 mmol/L   CO2  26 22 - 32 mmol/L   Glucose, Bld 130 (H) 70 - 99 mg/dL   BUN 12 6 - 20 mg/dL   Creatinine, Ser 7.84 0.44 - 1.00 mg/dL   Calcium 8.8 (L) 8.9 - 10.3 mg/dL   GFR calc non Af Amer >60 >60 mL/min   GFR calc Af Amer >60 >60 mL/min   Anion gap 10 5 - 15    Comment: Performed at [facility], [address] [place], [place] [address]  Magnesium     Status: None   Collection Time: 04/19/19  4:14 AM  Result Value Ref Range   Magnesium 1.7 1.7 - 2.4 mg/dL    Comment: Performed at [facility], [address], [place], [place] [address]  Glucose, capillary     Status: Abnormal   Collection Time: 04/19/19  8:06 AM  Result Value Ref Range   Glucose-Capillary 105 (H) 70 - 99 mg/dL  Glucose, capillary     Status: None   Collection Time: 04/19/19 12:44 PM  Result Value Ref Range   Glucose-Capillary 85 70 - 99 mg/dL  Glucose, capillary     Status: Abnormal   Collection Time: 04/19/19  4:12 PM  Result Value Ref Range   Glucose-Capillary 128 (H) 70 - 99 mg/dL  HIV Antibody (routine testing w rflx)     Status: None   Collection Time: 04/19/19  5:55 PM  Result  Value Ref Range   HIV Screen 4th Generation wRfx NON REACTIVE NON REACTIVE    Comment: Performed at [facility], [address], [place], [place] [address]  Glucose, capillary     Status: Abnormal   Collection Time: 04/19/19  9:42 PM  Result Value Ref Range   Glucose-Capillary 148 (H) 70 - 99 mg/dL   Comment 1 Notify RN    Comment 2 Document in Chart   Renal function panel     Status: Abnormal   Collection Time: 04/20/19  6:54 AM  Result Value Ref Range   Sodium 141 135 - 145 mmol/L   Potassium 4.3 3.5 - 5.1 mmol/L   Chloride 105 98 - 111 mmol/L   CO2 24 22 - 32 mmol/L   Glucose, Bld 128 (H) 70 - 99 mg/dL   BUN 7 6 - 20 mg/dL   Creatinine, Ser 1.02 0.44 - 1.00 mg/dL   Calcium 9.4 8.9 - 72.5 mg/dL   Phosphorus 3.7 2.5 - 4.6 mg/dL   Albumin 3.7 3.5 - 5.0 g/dL   GFR calc non Af Amer >60 >60 mL/min   GFR calc Af Amer >60 >60 mL/min   Anion gap 12 5 - 15    Comment: Performed at [facility], [address], [place], [place] [address]  TSH     Status: None   Collection Time: 04/20/19  6:54 AM  Result Value Ref Range   TSH 2.180 0.350 - 4.500 uIU/mL    Comment: Performed by a 3rd Generation assay with a functional sensitivity of <=0.01 uIU/mL. Performed at [facility], [address] [place], [place] [address]   Hemoglobin A1c     Status: Abnormal   Collection Time: 04/20/19  6:54 AM  Result Value Ref Range   Hgb A1c MFr Bld 5.8 (H) 4.8 - 5.6 %    Comment: (NOTE) Pre diabetes:          5.7%-6.4% Diabetes:              >6.4% Glycemic  control for   <7.0% adults with diabetes    Mean Plasma Glucose 119.76 mg/dL    Comment: Performed at [facility], [address] [place], [place] [address]  Ammonia     Status: Abnormal   Collection Time: 04/20/19  6:54 AM  Result Value Ref Range   Ammonia 36 (H) 9 - 35 umol/L    Comment: Performed at [facility], [address] [place], [place] [address]  Rapid HIV screen (HIV 1/2 Ab+Ag) ([facility] Only)      Status: None   Collection Time: 04/20/19  6:54 AM  Result Value Ref Range   HIV-1 P24 Antigen - HIV24 NON REACTIVE NON REACTIVE    Comment: (NOTE) Detection of p24 may be inhibited by biotin in the sample, causing false negative results in acute infection.    HIV 1/2 Antibodies NON REACTIVE NON REACTIVE   Interpretation (HIV Ag Ab)      A non reactive test result means that HIV 1 or HIV 2 antibodies and HIV 1 p24 antigen were not detected in the specimen.    Comment: Performed at [facility], [address] [place], [place] [address]  Vitamin B12     Status: None   Collection Time: 04/20/19  6:54 AM  Result Value Ref Range   Vitamin B-12 354 180 - 914 pg/mL    Comment: (NOTE) This assay is not validated for testing neonatal or myeloproliferative syndrome specimens for Vitamin B12 levels. Performed at [facility], [address] [place], [place] [address]   Magnesium     Status: Abnormal   Collection Time: 04/20/19  6:54 AM  Result Value Ref Range   Magnesium 1.6 (L) 1.7 - 2.4 mg/dL    Comment: Performed at [facility], [address] [place], [place] [address]  Glucose, capillary     Status: Abnormal   Collection Time: 04/20/19  7:46 AM  Result Value Ref Range   Glucose-Capillary 153 (H) 70 - 99 mg/dL  Glucose, capillary     Status: None   Collection Time: 04/20/19 11:59 AM  Result Value Ref Range   Glucose-Capillary 87 70 - 99 mg/dL    Current Facility-Administered Medications  Medication Dose Route Frequency Provider Last Rate Last Admin  . acetaminophen (TYLENOL) tablet 650 mg  650 mg Oral Q6H PRN Pearson GrippeKim, James, MD       Or  . acetaminophen (TYLENOL) suppository 650 mg  650 mg Rectal Q6H PRN Pearson GrippeKim, James, MD      . amLODipine (NORVASC) tablet 10 mg  10 mg Oral Daily Candelaria StagersGonfa, Taye T, MD   10 mg at 04/19/19 2056  . cloNIDine (CATAPRES - Dosed in mg/24 hr) patch 0.2 mg  0.2 mg Transdermal Q Sat Gonfa, Taye T, MD      . dextrose 50 % solution 25 mL  25 mL  Intravenous Q4H PRN Vann, Jessica U, DO      . enoxaparin (LOVENOX) injection 40 mg  40 mg Subcutaneous Daily Vann, Jessica U, DO   40 mg at 04/16/19 1046  . FLUoxetine (PROZAC) capsule 20 mg  20 mg Oral Daily Marlin CanaryVann, Jessica U, DO   20 mg at 04/19/19 2056  . hydrALAZINE (APRESOLINE) injection 10 mg  10 mg Intravenous Q6H PRN Marlin CanaryVann, Jessica U, DO   10 mg at 04/17/19 0941  . magnesium sulfate IVPB 2 g 50 mL  2 g Intravenous Once Almon HerculesGonfa, Taye T, MD      .  OLANZapine zydis (ZYPREXA) disintegrating tablet 10 mg  10 mg Oral BID Marlin Canary U, DO   10 mg at 04/19/19 2057    Musculoskeletal: Strength & Muscle Tone: within normal limits Gait & Station: Unable to assess Patient leans: Unable to assess  Psychiatric Specialty Exam: Physical Exam  Nursing note and vitals reviewed. Constitutional: She is oriented to person, place, and time. She appears well-developed.  HENT:  Head: Normocephalic.  Cardiovascular: Normal rate.  Respiratory: Effort normal.  Neurological: She is alert and oriented to person, place, and time.  Psychiatric: Her mood appears anxious. Her speech is delayed. She is actively hallucinating. Thought content is paranoid. Cognition and memory are impaired. She expresses inappropriate judgment.    Review of Systems  Constitutional: Negative.   HENT: Negative.   Eyes: Negative.   Respiratory: Negative.   Cardiovascular: Negative.   Gastrointestinal: Negative.   Genitourinary: Negative.   Musculoskeletal: Negative.   Skin: Negative.   Neurological: Negative.   Psychiatric/Behavioral: The patient is nervous/anxious.     Blood pressure (!) 159/92, pulse 76, temperature 98.1 F (36.7 C), temperature source Oral, resp. rate 18, weight 76.8 kg, SpO2 100 %.Body mass index is 24.29 kg/m.  General Appearance: Disheveled  Eye Contact:  Minimal  Speech:  Slow and Minimal speech  Volume:  Unable to assess  Mood:  Anxious  Affect:  Tearful  Thought Process:  Disorganized and  Descriptions of Associations: Loose  Orientation:  Other:  Unable to assess  Thought Content:  Hallucinations: Auditory  Suicidal Thoughts:  No  Homicidal Thoughts:  No  Memory:  Unable to assess  Judgement:  Impaired  Insight:  Lacking  Psychomotor Activity:  Normal  Concentration:  Concentration: Poor  Recall:  Fair  Fund of Knowledge:  Fair  Language:  Fair  Akathisia:  NA  Handed:  Right  AIMS (if indicated):     Assets:  Communication Skills Desire for Improvement Financial Resources/Insurance Housing Leisure Time Physical Health Resilience  ADL's:  Impaired  Cognition:  Impaired,  Mild  Sleep:        Treatment Plan Summary: Case discussed with Dr. Jama Flavors. Daily contact with patient to assess and evaluate symptoms and progress in treatment  Continue to recommend inpatient psychiatric treatment once medically clear.  Continue Zyprexa current dose.   Disposition: Recommend psychiatric Inpatient admission when medically cleared. Supportive therapy provided about ongoing stressors.  Patrcia Dolly, FNP 04/20/2019 1:22 PM   04/21/2019 at 1,15 PM Case reviewed with NP and with Dr. Alanda Slim, with whom I reviewed med recommendations as below. As reviewed, patient has continued to refuse medications/care.  Today presents febrile ( 101.4) /tachycardic and creatinine trending up (today 1.2) . Dr. Alanda Slim feels fever most likely related to UTI and she has been started on IV antibiotics . We reviewed psychiatric medication management: Medication over objection considerations have been reviewed .  1/22 consult note includes physician statement regarding treatment over objection if she continues to refuse meds .She has a history of clinical response  to Zyprexa and was discharged on it following psychiatric admission in May/2020. May consider Zyprexa PO/IM or Haldol PO/IM as treatment options . Note Paliperidone reported as allergy ( angioedema)  Note Zyprexa IM should NOT be  co-administered with a BZD due to risk of CNS depression.  However, based on new onset fever  would consider NMS among the possible differential diagnosis , although unlikely , and  infectious etiology for fever is suspected . Continue to monitor for stiffness/  rigidity, autonomic instability ,and consider obtaining CPK and LFTs . Consider avoiding antipsychotics/ preferentially managing with BZDs until fever etiology further determined .  Psychiatry Consult team will follow with you  Gabriel Earing , MD

## 2019-04-20 NOTE — Progress Notes (Signed)
Patient's husband is at the bedside.  Update provided.  He is concerned that the patient has not been eating and drinking or taking her medication today.  He requests that her medications be given tonight while he is here.  The patient states that she doesn't want her medicine or anything to drink, but she did take her pills with some persuasion from her husband.  Food and drink were also offered, but she refused.  Patient's husband and this RN educated her on the importance of eating and drinking in order to get better.  Patient voiced understanding, but still refused food or drink.

## 2019-04-21 ENCOUNTER — Inpatient Hospital Stay (HOSPITAL_COMMUNITY): Payer: BC Managed Care – PPO

## 2019-04-21 DIAGNOSIS — R0682 Tachypnea, not elsewhere classified: Secondary | ICD-10-CM | POA: Diagnosis not present

## 2019-04-21 DIAGNOSIS — Z046 Encounter for general psychiatric examination, requested by authority: Secondary | ICD-10-CM

## 2019-04-21 DIAGNOSIS — R6510 Systemic inflammatory response syndrome (SIRS) of non-infectious origin without acute organ dysfunction: Secondary | ICD-10-CM

## 2019-04-21 DIAGNOSIS — Z5329 Procedure and treatment not carried out because of patient's decision for other reasons: Secondary | ICD-10-CM | POA: Diagnosis not present

## 2019-04-21 DIAGNOSIS — Z20822 Contact with and (suspected) exposure to covid-19: Secondary | ICD-10-CM | POA: Diagnosis not present

## 2019-04-21 DIAGNOSIS — E876 Hypokalemia: Secondary | ICD-10-CM | POA: Diagnosis not present

## 2019-04-21 DIAGNOSIS — F209 Schizophrenia, unspecified: Secondary | ICD-10-CM | POA: Diagnosis not present

## 2019-04-21 DIAGNOSIS — N179 Acute kidney failure, unspecified: Secondary | ICD-10-CM | POA: Diagnosis not present

## 2019-04-21 DIAGNOSIS — A419 Sepsis, unspecified organism: Secondary | ICD-10-CM | POA: Diagnosis not present

## 2019-04-21 DIAGNOSIS — A530 Latent syphilis, unspecified as early or late: Secondary | ICD-10-CM

## 2019-04-21 DIAGNOSIS — F200 Paranoid schizophrenia: Secondary | ICD-10-CM | POA: Diagnosis not present

## 2019-04-21 LAB — COMPREHENSIVE METABOLIC PANEL
ALT: 16 U/L (ref 0–44)
AST: 15 U/L (ref 15–41)
Albumin: 3.4 g/dL — ABNORMAL LOW (ref 3.5–5.0)
Alkaline Phosphatase: 62 U/L (ref 38–126)
Anion gap: 10 (ref 5–15)
BUN: 12 mg/dL (ref 6–20)
CO2: 24 mmol/L (ref 22–32)
Calcium: 9.2 mg/dL (ref 8.9–10.3)
Chloride: 106 mmol/L (ref 98–111)
Creatinine, Ser: 1.09 mg/dL — ABNORMAL HIGH (ref 0.44–1.00)
GFR calc Af Amer: >60 mL/min (ref >60)
GFR calc non Af Amer: 55 mL/min — ABNORMAL LOW (ref >60)
Glucose, Bld: 108 mg/dL — ABNORMAL HIGH (ref 70–99)
Potassium: 4.2 mmol/L (ref 3.5–5.1)
Sodium: 140 mmol/L (ref 135–145)
Total Bilirubin: 0.5 mg/dL (ref 0.3–1.2)
Total Protein: 6.7 g/dL (ref 6.5–8.1)

## 2019-04-21 LAB — URINALYSIS, ROUTINE W REFLEX MICROSCOPIC
Bilirubin Urine: NEGATIVE
Glucose, UA: NEGATIVE mg/dL
Ketones, ur: 5 mg/dL — AB
Nitrite: NEGATIVE
Protein, ur: 100 mg/dL — AB
RBC / HPF: >50 RBC/hpf — ABNORMAL HIGH (ref 0–5)
Specific Gravity, Urine: 1.014 (ref 1.005–1.030)
WBC, UA: >50 WBC/hpf — ABNORMAL HIGH (ref 0–5)
pH: 5 (ref 5.0–8.0)

## 2019-04-21 LAB — RENAL FUNCTION PANEL
Albumin: 3.5 g/dL (ref 3.5–5.0)
Anion gap: 11 (ref 5–15)
BUN: 14 mg/dL (ref 6–20)
CO2: 26 mmol/L (ref 22–32)
Calcium: 9.3 mg/dL (ref 8.9–10.3)
Chloride: 104 mmol/L (ref 98–111)
Creatinine, Ser: 1.2 mg/dL — ABNORMAL HIGH (ref 0.44–1.00)
GFR calc Af Amer: 57 mL/min — ABNORMAL LOW (ref >60)
GFR calc non Af Amer: 49 mL/min — ABNORMAL LOW (ref >60)
Glucose, Bld: 106 mg/dL — ABNORMAL HIGH (ref 70–99)
Phosphorus: 4.6 mg/dL (ref 2.5–4.6)
Potassium: 4.3 mmol/L (ref 3.5–5.1)
Sodium: 141 mmol/L (ref 135–145)

## 2019-04-21 LAB — CK: Total CK: 77 U/L (ref 38–234)

## 2019-04-21 LAB — MAGNESIUM: Magnesium: 1.7 mg/dL (ref 1.7–2.4)

## 2019-04-21 LAB — GLUCOSE, CAPILLARY
Glucose-Capillary: 103 mg/dL — ABNORMAL HIGH (ref 70–99)
Glucose-Capillary: 92 mg/dL (ref 70–99)
Glucose-Capillary: 106 mg/dL — ABNORMAL HIGH (ref 70–99)

## 2019-04-21 MED ORDER — METOPROLOL TARTRATE 5 MG/5ML IV SOLN
2.5000 mg | Freq: Four times a day (QID) | INTRAVENOUS | Status: DC | PRN
  Administered 2019-04-21: 2.5 mg via INTRAVENOUS
  Filled 2019-04-21: qty 5

## 2019-04-21 MED ORDER — HALOPERIDOL 5 MG PO TABS
5.0000 mg | ORAL_TABLET | Freq: Two times a day (BID) | ORAL | Status: DC | PRN
  Filled 2019-04-21: qty 1

## 2019-04-21 MED ORDER — KCL IN DEXTROSE-NACL 10-5-0.45 MEQ/L-%-% IV SOLN
INTRAVENOUS | Status: DC
  Filled 2019-04-21 (×6): qty 1000

## 2019-04-21 MED ORDER — MAGNESIUM SULFATE 2 GM/50ML IV SOLN
2.0000 g | Freq: Once | INTRAVENOUS | Status: AC
  Administered 2019-04-21: 2 g via INTRAVENOUS
  Filled 2019-04-21: qty 50

## 2019-04-21 MED ORDER — LORAZEPAM 2 MG/ML IJ SOLN
2.0000 mg | Freq: Once | INTRAMUSCULAR | Status: AC
  Administered 2019-04-21: 2 mg via INTRAMUSCULAR
  Filled 2019-04-21: qty 1

## 2019-04-21 MED ORDER — SODIUM CHLORIDE 0.9 % IV SOLN
1.0000 g | INTRAVENOUS | Status: DC
  Administered 2019-04-21 – 2019-04-23 (×3): 1 g via INTRAVENOUS
  Filled 2019-04-21 (×3): qty 10

## 2019-04-21 MED ORDER — HALOPERIDOL LACTATE 5 MG/ML IJ SOLN
5.0000 mg | Freq: Two times a day (BID) | INTRAMUSCULAR | Status: DC | PRN
  Filled 2019-04-21: qty 1

## 2019-04-21 MED ORDER — POTASSIUM CHLORIDE IN NACL 20-0.45 MEQ/L-% IV SOLN
INTRAVENOUS | Status: DC
  Filled 2019-04-21: qty 1000

## 2019-04-21 NOTE — Progress Notes (Signed)
PROGRESS NOTE  Amy Casey TXM:[id number] DOB: January 22, 1961   PCP: Patient, No Pcp Per  Patient is from: Home.  DOA: 04/15/2019 LOS: 5  Brief Narrative / Interim history: 59 year old female with history of HTN, depression and schizophrenia sent to ER from [facility] for elevated blood pressure.  Reportedly has been of care site medications for sometimes and taken to [facility] by family members.  Noted to have elevated blood pressure and sent to the ED.  In ED, she was noted to have elevated blood pressure, hypokalemia and AKI for which she was admitted.  IVC paperwork was completed and psychiatry was consulted.   Psychiatry made adjustments to his psych medications and recommended inpatient psych when medically stable.  Patient continues to show refuse p.o. intake, care and medications.  Subjective: No major events overnight or this morning.  Continues to refuse p.o. intake, care and medications.  She responds "fine" but not willing to provide more history or respond to my questions.  She is now developing AKI.  She also spiked fever later this morning.  Somewhat tachycardic.  Objective: Vitals:   04/20/19 1620 04/20/19 2307 04/21/19 0500 04/21/19 1122  BP: (!) 156/93 (!) 142/98 131/76 134/90  Pulse: 89   (!) 114  Resp: 16 19 19    Temp: 98.6 F (37 C)   (!) 101.4 F (38.6 C)  TempSrc: Oral   Axillary  SpO2: 98%   98%  Weight:        Intake/Output Summary (Last 24 hours) at 04/21/2019 1323 Last data filed at 04/21/2019 1231 Gross per 24 hour  Intake 0 ml  Output 400 ml  Net -400 ml   Filed Weights   04/18/19 0500 04/19/19 0523  Weight: 76.3 kg 76.8 kg    Examination:  GENERAL: No apparent distress.  Nontoxic. HEENT: MMM.  Vision and hearing grossly intact.  RESP:  No IWOB.  Fair aeration bilaterally. CVS:  RRR. Heart sounds normal.  ABD/GI/GU: Bowel sounds present. Soft. Non tender.  MSK/EXT:  Moves extremities.  No notable rigidity.  No edema. SKIN: no apparent skin  lesion or wound NEURO: Awake, alert.  CN grossly intact.  Moves all extremities.  No apparent focal neuro deficit. PSYCH: Somewhat apprehensive.  Not willing to engage much.  Procedures:  None  Assessment & Plan: Schizophrenia/major depressive disorder: continues to decline p.o. intake, care and medications.  She is not febrile with AKI.  This will pose imminent danger to her life including death without intervention.  Psychiatry recommended medication over objection which is appropriate at this time.  She is already under involuntary commitment.  Attempted to call patient's husband on his cell phone and home phone but no answer. -Ativan IM 2 mg once, then will start Haldol 5 mg p.o. or IM as needed for agitation or refusal of care. -Continue Zyprexa 10 mg twice daily and Prozac 20 mg daily. -Continue 04/21/19. -Psychiatry following.  Essential hypertension: BP slightly elevated -Continue amlodipine to 10 mg daily (home dose) and clonidine patch. -Given history of angioedema with ACEI and noncompliance -Continue as needed hydralazine  AKI: Likely prerenal in the setting of poor p.o. intake. -Start IV fluid after securing IV line -Continue monitoring.  SIRS: Leukocytosis and tachycardia.  Unclear source of fever but suspect UTI based on her prior UA.  She is not willing to provide history.  Has no respiratory symptoms to suggest pneumonia. -CXR, urinalysis, urine culture and blood culture -IV ceftriaxone once we secure PIV  Positive RPR -Follow titers and  T. pallidum antibody. -HIV negative.  Hypernatremia: Resolved. -Discontinue IV fluid  Hypokalemia: Resolved. -Discontinue IV fluid  Hypomagnesemia: Improved -IV magnesium sulfate 2 g  Refusal of care: as above  Nutrition -We may have to consider alternative means if she continues to refuse p.o. intake               DVT prophylaxis: Subcu Lovenox Code Status: Full code Family Communication: Patient and/or  RN.  Attempted to call patient's husband for update but no answer. Disposition Plan: Remains inpatient for hypokalemia.  Final disposition inpatient psych. Consultants: Psychiatry   Microbiology summarized: [id number] negative. Influenza PCR negative. Blood culture pending. Urine culture pending.  Sch Meds:  Scheduled Meds: . amLODipine  10 mg Oral Daily  . cloNIDine  0.2 mg Transdermal Q Sat  . enoxaparin (LOVENOX) injection  40 mg Subcutaneous Daily  . FLUoxetine  20 mg Oral Daily  . OLANZapine zydis  10 mg Oral BID   Continuous Infusions: . 0.45 % NaCl with KCl 20 mEq / L    . cefTRIAXone (ROCEPHIN)  IV    . magnesium sulfate bolus IVPB     PRN Meds:.acetaminophen **OR** acetaminophen, dextrose, hydrALAZINE  Antimicrobials: Anti-infectives (From admission, onward)   Start     Dose/Rate Route Frequency Ordered Stop   04/21/19 1215  cefTRIAXone (ROCEPHIN) 1 g in sodium chloride 0.9 % 100 mL IVPB     1 g 200 mL/hr over 30 Minutes Intravenous Every 24 hours 04/21/19 1209         I have personally reviewed the following labs and images: CBC: Recent Labs  Lab 04/16/19 0219 04/16/19 0630 04/18/19 1230  WBC 6.7 8.1 8.2  NEUTROABS 4.4  --   --   HGB 14.6 15.9* 14.2  HCT 44.7 49.5* 44.7  MCV 88.9 89.8 90.9  PLT 369 359 333   BMP &GFR Recent Labs  Lab 04/17/19 0251 04/17/19 1246 04/18/19 1230 04/19/19 0414 04/20/19 0654 04/20/19 2349 04/21/19 0412  NA 144   < > 146* 142 141 140 141  K 2.7*   < > 3.5 3.3* 4.3 4.2 4.3  CL 107   < > 111 106 105 106 104  CO2 24   < > 26 26 24 24 26   GLUCOSE 76   < > 126* 130* 128* 108* 106*  BUN 23*   < > 14 12 7 12 14   CREATININE 1.07*   < > 0.83 0.85 0.76 1.09* 1.20*  CALCIUM 8.5*   < > 8.9 8.8* 9.4 9.2 9.3  MG 2.1  --  1.9 1.7 1.6*  --  1.7  PHOS  --   --   --   --  3.7  --  4.6   < > = values in this interval not displayed.   Estimated Creatinine Clearance: 53.9 mL/min (A) (by C-G formula based on SCr of 1.2 mg/dL  (H)). Liver & Pancreas: Recent Labs  Lab 04/16/19 0219 04/16/19 0630 04/20/19 0654 04/20/19 2349 04/21/19 0412  AST 19 21  --  15  --   ALT 22 24  --  16  --   ALKPHOS 62 68  --  62  --   BILITOT 1.4* 1.2  --  0.5  --   PROT 6.9 7.8  --  6.7  --   ALBUMIN 3.9 4.2 3.7 3.4* 3.5   No results for input(s): LIPASE, AMYLASE in the last 168 hours. Recent Labs  Lab 04/20/19 0654  AMMONIA 36*  Diabetic: Recent Labs    04/20/19 0654  HGBA1C 5.8*   Recent Labs  Lab 04/20/19 0746 04/20/19 1159 04/20/19 1622 04/21/19 0728 04/21/19 1120  GLUCAP 153* 87 93 103* 92   Cardiac Enzymes: No results for input(s): CKTOTAL, CKMB, CKMBINDEX, TROPONINI in the last 168 hours. No results for input(s): PROBNP in the last 8760 hours. Coagulation Profile: No results for input(s): INR, PROTIME in the last 168 hours. Thyroid Function Tests: Recent Labs    04/20/19 0654  TSH 2.180   Lipid Profile: No results for input(s): CHOL, HDL, LDLCALC, TRIG, CHOLHDL, LDLDIRECT in the last 72 hours. Anemia Panel: Recent Labs    04/20/19 0654  VITAMINB12 354   Urine analysis:    Component Value Date/Time   COLORURINE YELLOW 04/17/2019 1352   APPEARANCEUR CLOUDY (A) 04/17/2019 1352   LABSPEC 1.021 04/17/2019 1352   LABSPEC 1.015 08/21/2018 1154   PHURINE 5.0 04/17/2019 1352   GLUCOSEU NEGATIVE 04/17/2019 1352   HGBUR SMALL (A) 04/17/2019 1352   BILIRUBINUR NEGATIVE 04/17/2019 1352   BILIRUBINUR negative 08/21/2018 1154   BILIRUBINUR n 06/02/2016 1251   KETONESUR 80 (A) 04/17/2019 1352   PROTEINUR NEGATIVE 04/17/2019 1352   UROBILINOGEN negative 06/02/2016 1251   UROBILINOGEN 1.0 02/19/2014 1849   NITRITE NEGATIVE 04/17/2019 1352   LEUKOCYTESUR SMALL (A) 04/17/2019 1352   Sepsis Labs: Invalid input(s): PROCALCITONIN, LACTICIDVEN  Microbiology: Recent Results (from the past 240 hour(s))  Respiratory Panel by RT PCR (Flu A&B, Covid) - Nasopharyngeal Swab     Status: None   Collection  Time: 04/16/19  2:11 AM   Specimen: Nasopharyngeal Swab  Result Value Ref Range Status   SARS Coronavirus 2 by RT PCR NEGATIVE NEGATIVE Final    Comment: (NOTE) SARS-CoV-2 target nucleic acids are NOT DETECTED. The SARS-CoV-2 RNA is generally detectable in upper respiratoy specimens during the acute phase of infection. The lowest concentration of SARS-CoV-2 viral copies this assay can detect is 131 copies/mL. A negative result does not preclude SARS-Cov-2 infection and should not be used as the sole basis for treatment or other patient management decisions. A negative result may occur with  improper specimen collection/handling, submission of specimen other than nasopharyngeal swab, presence of viral mutation(s) within the areas targeted by this assay, and inadequate number of viral copies (<131 copies/mL). A negative result must be combined with clinical observations, patient history, and epidemiological information. The expected result is Negative. Fact Sheet for Patients:  https://www.moore.com/ Fact Sheet for Healthcare Providers:  https://www.young.biz/ This test is not yet ap proved or cleared by the [place] FDA and  has been authorized for detection and/or diagnosis of SARS-CoV-2 by FDA under an Emergency Use Authorization (EUA). This EUA will remain  in effect (meaning this test can be used) for the duration of the COVID-19 declaration under Section 564(b)(1) of the Act, 21 U.S.C. section 360bbb-3(b)(1), unless the authorization is terminated or revoked sooner.    Influenza A by PCR NEGATIVE NEGATIVE Final   Influenza B by PCR NEGATIVE NEGATIVE Final    Comment: (NOTE) The Xpert Xpress SARS-CoV-2/FLU/RSV assay is intended as an aid in  the diagnosis of influenza from Nasopharyngeal swab specimens and  should not be used as a sole basis for treatment. Nasal washings and  aspirates are unacceptable for Xpert Xpress  SARS-CoV-2/FLU/RSV  testing. Fact Sheet for Patients: https://www.moore.com/ Fact Sheet for Healthcare Providers: https://www.young.biz/ This test is not yet approved or cleared by the [place] FDA and  has been authorized for detection  and/or diagnosis of SARS-CoV-2 by  FDA under an Emergency Use Authorization (EUA). This EUA will remain  in effect (meaning this test can be used) for the duration of the  Covid-19 declaration under Section 564(b)(1) of the Act, 21  U.S.C. section 360bbb-3(b)(1), unless the authorization is  terminated or revoked. Performed at [facility], [address], [place], [place] [address]     Radiology Studies: No results found.    Aysia Lowder T. Dametria Tuzzolino [facility]  If 7PM-7AM, please contact night-coverage [email or web address] Password [email or web address] 04/21/2019, 1:23 PM

## 2019-04-21 NOTE — Progress Notes (Signed)
VAST consulted to place PIV access. Upon entering room and introducing self, patient refused to let VAST RN assess her arms.

## 2019-04-21 NOTE — Consult Note (Signed)
04/21/2019 at 1,15 PM Case reviewed with NP and with Dr. Alanda Slim, with whom I reviewed case/med management recommendations.  59 year old female, history of Schizophrenia ,Depression. She had not been taking her psychiatric medications recently ( Zyprexa, Prozac) - required inpatient medical admission due to hypokalemia, AKI ( admission Cr 1.35) , and possible encephalopathy . As reviewed, patient has continued to refuse medications/care.  Today presents febrile ( 101.4) /tachycardic and creatinine trending up (today 1.2) . It is felt by treatment team that  fever most likely related to UTI and she has been started on IV antibiotics . Current psychiatric medications- Zyprexa 10 mgrs BID, Prozac 20 mgrs QDAY  We reviewed psychiatric medication management: Medication over objection considerations have been reviewed .  1/22 consult note includes physician statement regarding treatment over objection if she continues to refuse meds .She has a history of clinical response  to Zyprexa and was discharged on it following psychiatric admission in May/2020. Recommend managing with low doses based on current debilitated state. May consider Zyprexa 2.5 -  5 mgrs PO/IM BID  or Haldol 5 mgrsPO/IM BID as treatment options . Note Paliperidone reported as allergy ( angioedema)  Note Zyprexa IM should NOT be co-administered with a BZD due to risk of CNS depression.  However, based on new onset fever  would consider NMS among the possible differential diagnosis , (although unlikely , and  infectious etiology for fever is suspected) . Continue to monitor for stiffness/ rigidity, autonomic instability ,and consider obtaining CPK and LFTs .   Consider avoiding antipsychotics/ preferentially managing with BZDs if required,  until fever etiology further determined .  Psychiatry Consult team will follow with you  Sallyanne Havers , MD

## 2019-04-21 NOTE — Progress Notes (Signed)
Pt refused all treatment during my shift this AM to include medications and physical  Assessment. MD made aware.new orders given and carroed out. meds returned to pyxis. VWilliams,RN.

## 2019-04-22 DIAGNOSIS — R6520 Severe sepsis without septic shock: Secondary | ICD-10-CM

## 2019-04-22 DIAGNOSIS — A419 Sepsis, unspecified organism: Secondary | ICD-10-CM

## 2019-04-22 LAB — RENAL FUNCTION PANEL
Albumin: 3.1 g/dL — ABNORMAL LOW (ref 3.5–5.0)
Anion gap: 13 (ref 5–15)
BUN: 27 mg/dL — ABNORMAL HIGH (ref 6–20)
CO2: 23 mmol/L (ref 22–32)
Calcium: 8.8 mg/dL — ABNORMAL LOW (ref 8.9–10.3)
Chloride: 104 mmol/L (ref 98–111)
Creatinine, Ser: 1.34 mg/dL — ABNORMAL HIGH (ref 0.44–1.00)
GFR calc Af Amer: 50 mL/min — ABNORMAL LOW (ref >60)
GFR calc non Af Amer: 43 mL/min — ABNORMAL LOW (ref >60)
Glucose, Bld: 126 mg/dL — ABNORMAL HIGH (ref 70–99)
Phosphorus: 3.4 mg/dL (ref 2.5–4.6)
Potassium: 4.6 mmol/L (ref 3.5–5.1)
Sodium: 140 mmol/L (ref 135–145)

## 2019-04-22 LAB — CBC WITH DIFFERENTIAL/PLATELET
Abs Immature Granulocytes: 0.08 10*3/uL — ABNORMAL HIGH (ref 0.00–0.07)
Basophils Absolute: 0 10*3/uL (ref 0.0–0.1)
Basophils Relative: 0 %
Eosinophils Absolute: 0 10*3/uL (ref 0.0–0.5)
Eosinophils Relative: 0 %
HCT: 44.2 % (ref 36.0–46.0)
Hemoglobin: 14.3 g/dL (ref 12.0–15.0)
Immature Granulocytes: 1 %
Lymphocytes Relative: 12 %
Lymphs Abs: 1.6 10*3/uL (ref 0.7–4.0)
MCH: 29.1 pg (ref 26.0–34.0)
MCHC: 32.4 g/dL (ref 30.0–36.0)
MCV: 90 fL (ref 80.0–100.0)
Monocytes Absolute: 1.1 10*3/uL — ABNORMAL HIGH (ref 0.1–1.0)
Monocytes Relative: 8 %
Neutro Abs: 11.3 10*3/uL — ABNORMAL HIGH (ref 1.7–7.7)
Neutrophils Relative %: 79 %
Platelets: 286 10*3/uL (ref 150–400)
RBC: 4.91 MIL/uL (ref 3.87–5.11)
RDW: 12.7 % (ref 11.5–15.5)
WBC: 14.1 10*3/uL — ABNORMAL HIGH (ref 4.0–10.5)
nRBC: 0 % (ref 0.0–0.2)

## 2019-04-22 LAB — RPR
RPR Ser Ql: REACTIVE — AB
RPR Titer: 1:1 {titer}

## 2019-04-22 LAB — MAGNESIUM: Magnesium: 2.5 mg/dL — ABNORMAL HIGH (ref 1.7–2.4)

## 2019-04-22 LAB — GLUCOSE, CAPILLARY
Glucose-Capillary: 97 mg/dL (ref 70–99)
Glucose-Capillary: 121 mg/dL — ABNORMAL HIGH (ref 70–99)

## 2019-04-22 LAB — URINE CULTURE

## 2019-04-22 LAB — T.PALLIDUM AB, TOTAL: T Pallidum Abs: REACTIVE — AB

## 2019-04-22 MED ORDER — LORAZEPAM 2 MG/ML IJ SOLN
1.0000 mg | Freq: Once | INTRAMUSCULAR | Status: AC | PRN
  Administered 2019-04-23: 1 mg via INTRAVENOUS
  Filled 2019-04-22: qty 1

## 2019-04-22 NOTE — Progress Notes (Signed)
Attempted to give her medication today but pt had refused. Encouraged pt to eat her food but had continued to refuse.

## 2019-04-22 NOTE — Progress Notes (Signed)
PROGRESS NOTE  Amy Casey EZM:[id number] DOB: Mar 20, 1961   PCP: Patient, No Pcp Per  Patient is from: Home.  DOA: 04/15/2019 LOS: 6  Brief Narrative / Interim history: 59 year old female with history of HTN, depression and schizophrenia sent to ER from [facility] for elevated blood pressure.  Reportedly has been of care site medications for sometimes and taken to [facility] by family members.  Noted to have elevated blood pressure and sent to the ED.  In ED, she was noted to have elevated blood pressure, hypokalemia and AKI for which she was admitted.  IVC paperwork was completed and psychiatry was consulted.   Psychiatry made adjustments to his psych medications and recommended inpatient psych when medically stable.  Patient continues to refuse p.o. intake, care and medications. She also developed AKI and sepsis likely from UTI.   Subjective: Continues to refuse p.o. intake, care and medications.  She had a couple of bites with persuasion from her husband last night.  She has been getting IV fluid and IV medications.  She says she just want to get out of here.  Not willing to answer further questions.   Objective: Vitals:   04/21/19 0500 04/21/19 1122 04/21/19 1605 04/22/19 0500  BP: 131/76 134/90 133/76 127/84  Pulse:  (!) 114 (!) 107 (!) 115  Resp: 19  20 19   Temp:  (!) 101.4 F (38.6 C) 99 F (37.2 C)   TempSrc:  Axillary Axillary   SpO2:  98%  95%  Weight:        Intake/Output Summary (Last 24 hours) at 04/22/2019 1454 Last data filed at 04/22/2019 1000 Gross per 24 hour  Intake 825 ml  Output 200 ml  Net 625 ml   Filed Weights   04/18/19 0500 04/19/19 0523  Weight: 76.3 kg 76.8 kg    Examination:  GENERAL: No apparent distress.  Nontoxic. HEENT: MMM.  Vision and hearing grossly intact.  NECK: Supple.  No apparent JVD.  RESP: On RA.  No IWOB. Good air movement bilaterally. CVS: Tachycardic to 110s.04/21/19 Heart sounds normal.  ABD/GI/GU: Bowel sounds present. Soft. Non  tender.  MSK/EXT:  Moves extremities. No apparent deformity. No edema.  SKIN: no apparent skin lesion or wound NEURO: Sleepy but woke up during exam.  No apparent focal neuro deficit.  Moves all extremities. PSYCH: Sleepy but easily wakes up.  Somewhat apprehensive.  Procedures:  None  Assessment & Plan: Sepsis likely due to UTI: patient had a UA concerning for UTI on admission but did not have signs and symptoms of infection until 1/24 when she spiked fever and became tachycardic.  Repeat UA concerning for UTI.  Urine culture with multiple species.  Blood cultures negative so far.  CXR negative so far.  No respiratory issues. -Continue IV ceftriaxone 1/24> -Obtain repeat urine culture  Schizophrenia/major depressive disorder: continues to decline p.o. intake, care and medications.  She is not febrile with AKI.  This will pose imminent danger to her life including death without intervention.  Psychiatry recommended 'medication over objection' which is appropriate at this time.  She is already under involuntary commitment.  Discussed with patient's husband -We will place core track for feeding and medication administration. -Continue Zyprexa 10 mg twice daily and Prozac 20 mg daily. -P.o. or IM Haldol as needed -Optimize K and magnesium.  Intermittent EKG. -03-03-1985. -Psychiatry following.  AKI: Likely prerenal in the setting of poor p.o. intake. -Continue IV fluid -Continue monitoring.  Positive RPR with low titer. -Follow T.  pallidum antibody. -HIV negative.  Essential hypertension: BP slightly elevated -Continue amlodipine to 10 mg daily (home dose) and clonidine patch. -Given history of angioedema with ACEI and noncompliance -Continue as needed hydralazine  Hypernatremia: Resolved. -Discontinue IV fluid  Hypokalemia: Resolved. -Monitor on replenish  Hypomagnesemia: Resolved. -Monitor on replenish  Refusal of care: as above  Nutrition -Place cortrak for  feeding               DVT prophylaxis: Subcu Lovenox Code Status: Full code Family Communication: Updated patient's husband over the phone. Disposition Plan: Remains inpatient for treatment of sepsis and AKI.  Also requiring NGT for feeding and medication administration. Consultants: Psychiatry   Microbiology summarized: COVID-19 negative. Influenza PCR negative. Blood culture negative Urine culture multiple species  Sch Meds:  Scheduled Meds: . amLODipine  10 mg Oral Daily  . cloNIDine  0.2 mg Transdermal Q Sat  . enoxaparin (LOVENOX) injection  40 mg Subcutaneous Daily  . FLUoxetine  20 mg Oral Daily  . OLANZapine zydis  10 mg Oral BID   Continuous Infusions: . cefTRIAXone (ROCEPHIN)  IV 1 g (04/21/19 1608)  . dextrose 5 % and 0.45 % NaCl with KCl 10 mEq/L 75 mL/hr at 04/21/19 1823   PRN Meds:.acetaminophen **OR** acetaminophen, dextrose, LORazepam, metoprolol tartrate  Antimicrobials: Anti-infectives (From admission, onward)   Start     Dose/Rate Route Frequency Ordered Stop   04/21/19 1215  cefTRIAXone (ROCEPHIN) 1 g in sodium chloride 0.9 % 100 mL IVPB     1 g 200 mL/hr over 30 Minutes Intravenous Every 24 hours 04/21/19 1209         I have personally reviewed the following labs and images: CBC: Recent Labs  Lab 04/16/19 0219 04/16/19 0630 04/18/19 1230 04/22/19 0352  WBC 6.7 8.1 8.2 14.1*  NEUTROABS 4.4  --   --  11.3*  HGB 14.6 15.9* 14.2 14.3  HCT 44.7 49.5* 44.7 44.2  MCV 88.9 89.8 90.9 90.0  PLT 369 359 333 286   BMP &GFR Recent Labs  Lab 04/18/19 1230 04/18/19 1230 04/19/19 0414 04/20/19 0654 04/20/19 2349 04/21/19 0412 04/22/19 0352  NA 146*   < > 142 141 140 141 140  K 3.5   < > 3.3* 4.3 4.2 4.3 4.6  CL 111   < > 106 105 106 104 104  CO2 26   < > 26 24 24 26 23   GLUCOSE 126*   < > 130* 128* 108* 106* 126*  BUN 14   < > 12 7 12 14  27*  CREATININE 0.83   < > 0.85 0.76 1.09* 1.20* 1.34*  CALCIUM 8.9   < > 8.8* 9.4 9.2 9.3 8.8*    MG 1.9  --  1.7 1.6*  --  1.7 2.5*  PHOS  --   --   --  3.7  --  4.6 3.4   < > = values in this interval not displayed.   Estimated Creatinine Clearance: 48.3 mL/min (A) (by C-G formula based on SCr of 1.34 mg/dL (H)). Liver & Pancreas: Recent Labs  Lab 04/16/19 0219 04/16/19 0219 04/16/19 0630 04/20/19 0654 04/20/19 2349 04/21/19 0412 04/22/19 0352  AST 19  --  21  --  15  --   --   ALT 22  --  24  --  16  --   --   ALKPHOS 62  --  68  --  62  --   --   BILITOT 1.4*  --  1.2  --  0.5  --   --   PROT 6.9  --  7.8  --  6.7  --   --   ALBUMIN 3.9   < > 4.2 3.7 3.4* 3.5 3.1*   < > = values in this interval not displayed.   No results for input(s): LIPASE, AMYLASE in the last 168 hours. Recent Labs  Lab 04/20/19 0654  AMMONIA 36*   Diabetic: Recent Labs    04/20/19 0654  HGBA1C 5.8*   Recent Labs  Lab 04/20/19 1159 04/20/19 1622 04/21/19 0728 04/21/19 1120 04/21/19 1715  GLUCAP 87 93 103* 92 106*   Cardiac Enzymes: Recent Labs  Lab 04/21/19 1812  CKTOTAL 77   No results for input(s): PROBNP in the last 8760 hours. Coagulation Profile: No results for input(s): INR, PROTIME in the last 168 hours. Thyroid Function Tests: Recent Labs    04/20/19 0654  TSH 2.180   Lipid Profile: No results for input(s): CHOL, HDL, LDLCALC, TRIG, CHOLHDL, LDLDIRECT in the last 72 hours. Anemia Panel: Recent Labs    04/20/19 0654  VITAMINB12 354   Urine analysis:    Component Value Date/Time   COLORURINE AMBER (A) 04/21/2019 1254   APPEARANCEUR TURBID (A) 04/21/2019 1254   LABSPEC 1.014 04/21/2019 1254   LABSPEC 1.015 08/21/2018 1154   PHURINE 5.0 04/21/2019 1254   GLUCOSEU NEGATIVE 04/21/2019 1254   HGBUR LARGE (A) 04/21/2019 1254   BILIRUBINUR NEGATIVE 04/21/2019 1254   BILIRUBINUR negative 08/21/2018 1154   BILIRUBINUR n 06/02/2016 1251   KETONESUR 5 (A) 04/21/2019 1254   PROTEINUR 100 (A) 04/21/2019 1254   UROBILINOGEN negative 06/02/2016 1251    UROBILINOGEN 1.0 02/19/2014 1849   NITRITE NEGATIVE 04/21/2019 1254   LEUKOCYTESUR LARGE (A) 04/21/2019 1254   Sepsis Labs: Invalid input(s): PROCALCITONIN, LACTICIDVEN  Microbiology: Recent Results (from the past 240 hour(s))  Respiratory Panel by RT PCR (Flu A&B, Covid) - Nasopharyngeal Swab     Status: None   Collection Time: 04/16/19  2:11 AM   Specimen: Nasopharyngeal Swab  Result Value Ref Range Status   SARS Coronavirus 2 by RT PCR NEGATIVE NEGATIVE Final    Comment: (NOTE) SARS-CoV-2 target nucleic acids are NOT DETECTED. The SARS-CoV-2 RNA is generally detectable in upper respiratoy specimens during the acute phase of infection. The lowest concentration of SARS-CoV-2 viral copies this assay can detect is 131 copies/mL. A negative result does not preclude SARS-Cov-2 infection and should not be used as the sole basis for treatment or other patient management decisions. A negative result may occur with  improper specimen collection/handling, submission of specimen other than nasopharyngeal swab, presence of viral mutation(s) within the areas targeted by this assay, and inadequate number of viral copies (<131 copies/mL). A negative result must be combined with clinical observations, patient history, and epidemiological information. The expected result is Negative. Fact Sheet for Patients:  https://www.moore.com/ Fact Sheet for Healthcare Providers:  https://www.young.biz/ This test is not yet ap proved or cleared by the [place] FDA and  has been authorized for detection and/or diagnosis of SARS-CoV-2 by FDA under an Emergency Use Authorization (EUA). This EUA will remain  in effect (meaning this test can be used) for the duration of the COVID-19 declaration under Section 564(b)(1) of the Act, 21 U.S.C. section 360bbb-3(b)(1), unless the authorization is terminated or revoked sooner.    Influenza A by PCR NEGATIVE NEGATIVE Final     Influenza B by PCR NEGATIVE NEGATIVE Final    Comment: (NOTE)  The Xpert Xpress SARS-CoV-2/FLU/RSV assay is intended as an aid in  the diagnosis of influenza from Nasopharyngeal swab specimens and  should not be used as a sole basis for treatment. Nasal washings and  aspirates are unacceptable for Xpert Xpress SARS-CoV-2/FLU/RSV  testing. Fact Sheet for Patients: PinkCheek.be Fact Sheet for Healthcare Providers: GravelBags.it This test is not yet approved or cleared by the [place] FDA and  has been authorized for detection and/or diagnosis of SARS-CoV-2 by  FDA under an Emergency Use Authorization (EUA). This EUA will remain  in effect (meaning this test can be used) for the duration of the  Covid-19 declaration under Section 564(b)(1) of the Act, 21  U.S.C. section 360bbb-3(b)(1), unless the authorization is  terminated or revoked. Performed at [facility], [place] [address], [place], [place] [address]   Culture, Urine     Status: Abnormal   Collection Time: 04/21/19 12:54 PM   Specimen: Urine, Random  Result Value Ref Range Status   Specimen Description URINE, RANDOM  Final   Special Requests   Final    NONE Performed at [facility], [place] [address] [place], [place] [address]    Culture MULTIPLE SPECIES PRESENT, SUGGEST RECOLLECTION (A)  Final   Report Status 04/22/2019 FINAL  Final  Culture, blood (routine x 2)     Status: None (Preliminary result)   Collection Time: 04/21/19  6:11 PM   Specimen: BLOOD RIGHT HAND  Result Value Ref Range Status   Specimen Description BLOOD RIGHT HAND  Final   Special Requests   Final    BOTTLES DRAWN AEROBIC AND ANAEROBIC Blood Culture adequate volume   Culture   Final    NO GROWTH < 12 HOURS Performed at [facility], [place] [address] [place], [place] [address]    Report Status PENDING  Incomplete    Radiology Studies: DG Chest Port 1 View  Result  Date: 04/21/2019 CLINICAL DATA:  Tachypnea EXAM: PORTABLE CHEST 1 VIEW COMPARISON:  Chest radiograph 07/30/2018 FINDINGS: Borderline cardiomegaly, unchanged. Portions of the left hemidiaphragm are poorly delineated which may reflect left basilar atelectasis. A left lower lobe pneumonia or left pleural effusion is difficult to exclude The right lung is clear. No evidence of pneumothorax. No acute bony abnormality. Thoracic spondylosis. Overlying cardiac monitoring leads. IMPRESSION: Portions of the left hemidiaphragm are poorly delineated which may reflect left basilar atelectasis. A left lower lobe pneumonia or left pleural effusion is difficult to exclude. Consider a lateral view radiograph for further evaluation. Borderline cardiomegaly, unchanged. Electronically Signed   By: Kellie Simmering DO   On: 04/21/2019 19:06      Preslyn Warr [address] [place]  If 7PM-7AM, please contact night-coverage [email or web address] Password [facility] 04/22/2019, 2:54 PM

## 2019-04-23 DIAGNOSIS — N390 Urinary tract infection, site not specified: Secondary | ICD-10-CM

## 2019-04-23 DIAGNOSIS — F202 Catatonic schizophrenia: Secondary | ICD-10-CM

## 2019-04-23 LAB — GLUCOSE, CAPILLARY
Glucose-Capillary: 109 mg/dL — ABNORMAL HIGH (ref 70–99)
Glucose-Capillary: 83 mg/dL (ref 70–99)
Glucose-Capillary: 110 mg/dL — ABNORMAL HIGH (ref 70–99)
Glucose-Capillary: 96 mg/dL (ref 70–99)

## 2019-04-23 LAB — RENAL FUNCTION PANEL
Albumin: 2.8 g/dL — ABNORMAL LOW (ref 3.5–5.0)
Anion gap: 10 (ref 5–15)
BUN: 23 mg/dL — ABNORMAL HIGH (ref 6–20)
CO2: 25 mmol/L (ref 22–32)
Calcium: 8.7 mg/dL — ABNORMAL LOW (ref 8.9–10.3)
Chloride: 107 mmol/L (ref 98–111)
Creatinine, Ser: 1.01 mg/dL — ABNORMAL HIGH (ref 0.44–1.00)
GFR calc Af Amer: >60 mL/min (ref >60)
GFR calc non Af Amer: >60 mL/min (ref >60)
Glucose, Bld: 123 mg/dL — ABNORMAL HIGH (ref 70–99)
Phosphorus: 3.2 mg/dL (ref 2.5–4.6)
Potassium: 4.3 mmol/L (ref 3.5–5.1)
Sodium: 142 mmol/L (ref 135–145)

## 2019-04-23 LAB — CBC
HCT: 43.8 % (ref 36.0–46.0)
Hemoglobin: 13.6 g/dL (ref 12.0–15.0)
MCH: 28.8 pg (ref 26.0–34.0)
MCHC: 31.1 g/dL (ref 30.0–36.0)
MCV: 92.6 fL (ref 80.0–100.0)
Platelets: 279 10*3/uL (ref 150–400)
RBC: 4.73 MIL/uL (ref 3.87–5.11)
RDW: 12.5 % (ref 11.5–15.5)
WBC: 8.8 10*3/uL (ref 4.0–10.5)
nRBC: 0 % (ref 0.0–0.2)

## 2019-04-23 LAB — PHOSPHORUS: Phosphorus: 2.9 mg/dL (ref 2.5–4.6)

## 2019-04-23 LAB — MAGNESIUM
Magnesium: 2.4 mg/dL (ref 1.7–2.4)
Magnesium: 2.3 mg/dL (ref 1.7–2.4)

## 2019-04-23 MED ORDER — METOPROLOL TARTRATE 25 MG PO TABS
25.0000 mg | ORAL_TABLET | Freq: Two times a day (BID) | ORAL | Status: DC
  Administered 2019-04-23 – 2019-05-04 (×22): 25 mg
  Filled 2019-04-23 (×22): qty 1

## 2019-04-23 MED ORDER — OSMOLITE 1.5 CAL PO LIQD
1000.0000 mL | ORAL | Status: DC
  Administered 2019-04-23 – 2019-05-03 (×8): 1000 mL
  Filled 2019-04-23 (×14): qty 1000

## 2019-04-23 MED ORDER — METOPROLOL TARTRATE 25 MG PO TABS: 25.0000 mg | ORAL_TABLET | Freq: Two times a day (BID) | ORAL | Status: DC

## 2019-04-23 MED ORDER — AMLODIPINE BESYLATE 5 MG PO TABS
5.0000 mg | ORAL_TABLET | Freq: Every day | ORAL | Status: DC
  Administered 2019-04-23: 5 mg
  Filled 2019-04-23: qty 1

## 2019-04-23 MED ORDER — OLANZAPINE 10 MG PO TABS
10.0000 mg | ORAL_TABLET | Freq: Two times a day (BID) | ORAL | Status: DC
  Administered 2019-04-23 – 2019-05-01 (×17): 10 mg
  Filled 2019-04-23 (×18): qty 1

## 2019-04-23 MED ORDER — MIRTAZAPINE 7.5 MG PO TABS
7.5000 mg | ORAL_TABLET | Freq: Every day | ORAL | Status: DC
  Administered 2019-04-23: 7.5 mg
  Filled 2019-04-23: qty 1

## 2019-04-23 MED ORDER — FLUOXETINE HCL 20 MG PO CAPS
20.0000 mg | ORAL_CAPSULE | Freq: Every day | ORAL | Status: DC
  Administered 2019-04-24 – 2019-05-02 (×9): 20 mg
  Filled 2019-04-23 (×9): qty 1

## 2019-04-23 MED ORDER — MIRTAZAPINE 7.5 MG PO TABS: 7.5000 mg | ORAL_TABLET | Freq: Every day | ORAL | Status: DC

## 2019-04-23 NOTE — Progress Notes (Signed)
PROGRESS NOTE  Amy Casey EXB:[id number] DOB: 1960-09-06   PCP: Patient, No Pcp Per  Patient is from: Home.  DOA: 04/15/2019 LOS: 7  Brief Narrative / Interim history: 59 year old female with history of HTN, depression and schizophrenia sent to ER from [facility] for elevated blood pressure.  Reportedly has been of care site medications for sometimes and taken to [facility] by family members.  Noted to have elevated blood pressure and sent to the ED.  In ED, she was noted to have elevated blood pressure, hypokalemia and AKI for which she was admitted.  IVC paperwork was completed and psychiatry was consulted.   Psychiatry made adjustments to his psych medications and recommended inpatient psych when medically stable.  Patient continues to refuse p.o. intake, care and medications. She also developed AKI and sepsis likely from UTI.   Subjective: No major events overnight or this morning.  Spiked fever to 100.9 about 9 PM last night.  Continues to refuse medications. Reportedly had few bites of her breakfast with assistance this morning. Otherwise, hemodynamically stable.  She only says "I am fine".  Not willing to answer further questions.   Objective: Vitals:   04/22/19 1643 04/22/19 2048 04/22/19 2204 04/23/19 0025  BP: 133/81 (!) 142/80    Pulse: (!) 104 (!) 101    Resp: 19 (!) 22 20   Temp:  (!) 100.9 F (38.3 C) 98.1 F (36.7 C)   TempSrc:  Oral Oral   SpO2: 96% 99%  93%  Weight:        Intake/Output Summary (Last 24 hours) at 04/23/2019 1242 Last data filed at 04/23/2019 1241 Gross per 24 hour  Intake 850 ml  Output 600 ml  Net 250 ml   Filed Weights   04/18/19 0500 04/19/19 0523  Weight: 76.3 kg 76.8 kg    Examination:  GENERAL: No acute distress.  Appears well.  HEENT: MMM.  Vision and hearing grossly intact.  NECK: Supple.  No apparent JVD.  RESP:  No IWOB. Good air movement bilaterally. CVS:  RRR. Heart sounds normal.  ABD/GI/GU: Bowel sounds present. Soft.  Non tender.  MSK/EXT:  Moves extremities. No apparent deformity. No edema.  SKIN: no apparent skin lesion or wound NEURO: Awake, alert.  No apparent focal neuro deficit. PSYCH: Somewhat apprehensive.  Procedures:  None  Assessment & Plan: Sepsis likely due to UTI: patient had a UA concerning for UTI on admission but did not have signs and symptoms of infection until 1/24 when she spiked fever and became tachycardic.  Repeat UA concerning for UTI.  Urine culture with multiple species.  Blood cultures negative so far.  Repeat urine culture pending. -Continue IV ceftriaxone 1/24> -Follow-up repeat urine culture  Schizophrenia/major depressive disorder: continues to decline p.o. intake, care and medications.  She is not febrile with AKI.  This will pose imminent danger to her life including death without intervention.  Psychiatry recommended 'medication over objection' which is appropriate at this time.  She is already under involuntary commitment.  Discussed with patient's husband on 1/25. -Consulted Cortrak team and dietitian on 1/25. -Continue Zyprexa 10 mg BID and Prozac 20 mg daily-has not received these medications in three days now -P.o. or IM Haldol as needed -Add low-dose Remeron -Optimize K and magnesium.  Intermittent EKG. -Patent attorney. -Psychiatry following.  AKI/azotemia.: Baseline Cr 0.7-0.8 >> 1.35 (admit) > 0.76>> 1.34> 1.01: Likely prerenal in the setting of poor p.o. intake. -Continue IV fluid -Continue monitoring.  Positive RPR with low titer. -Follow  T. pallidum antibody. -HIV negative.  Essential hypertension: Normotensive for most part. -Discontinue amlodipine-has not been receiving. -Continue clonidine patch -Given history of angioedema with ACEI and noncompliance -Continue as needed hydralazine  Hypernatremia: Resolved.  Hypokalemia: Resolved.  Hypomagnesemia: Resolved.  Refusal of care: as above -Continue encouraging -Medication over  objection as above. -Tube feeding after Cortrak  Nutrition -Cortrak team and dietitian consulted -Place cortrak for tube feeding and medication administration. -Add low-dose Remeron.     Nutrition Problem: Inadequate oral intake Etiology: lethargy/confusion  Signs/Symptoms: meal completion < 25%  Interventions: Tube feeding, Ensure Enlive (each supplement provides 350kcal and 20 grams of protein)   DVT prophylaxis: Subcu Lovenox Code Status: Full code Family Communication: Updated patient's husband over the phone on 1/25. Disposition Plan: Remains inpatient for treatment of urosepsis and AKI.  Also placing NGT for feeding and medication administration. Consultants: Psychiatry   Microbiology summarized: COVID-19 negative. Influenza PCR negative. Blood culture negative Urine culture multiple species Repeat urine culture pending.  Sch Meds:  Scheduled Meds: . amLODipine  10 mg Oral Daily  . cloNIDine  0.2 mg Transdermal Q Sat  . enoxaparin (LOVENOX) injection  40 mg Subcutaneous Daily  . FLUoxetine  20 mg Oral Daily  . OLANZapine zydis  10 mg Oral BID   Continuous Infusions: . cefTRIAXone (ROCEPHIN)  IV 1 g (04/23/19 1235)  . dextrose 5 % and 0.45 % NaCl with KCl 10 mEq/L 75 mL/hr at 04/22/19 2212   PRN Meds:.acetaminophen **OR** acetaminophen, dextrose, LORazepam, metoprolol tartrate  Antimicrobials: Anti-infectives (From admission, onward)   Start     Dose/Rate Route Frequency Ordered Stop   04/21/19 1215  cefTRIAXone (ROCEPHIN) 1 g in sodium chloride 0.9 % 100 mL IVPB     1 g 200 mL/hr over 30 Minutes Intravenous Every 24 hours 04/21/19 1209         I have personally reviewed the following labs and images: CBC: Recent Labs  Lab 04/18/19 1230 04/22/19 0352 04/23/19 0427  WBC 8.2 14.1* 8.8  NEUTROABS  --  11.3*  --   HGB 14.2 14.3 13.6  HCT 44.7 44.2 43.8  MCV 90.9 90.0 92.6  PLT 333 286 279   BMP &GFR Recent Labs  Lab 04/19/19 0414 04/19/19 0414  04/20/19 0654 04/20/19 2349 04/21/19 0412 04/22/19 0352 04/23/19 0427  NA 142   < > 141 140 141 140 142  K 3.3*   < > 4.3 4.2 4.3 4.6 4.3  CL 106   < > 105 106 104 104 107  CO2 26   < > 24 24 26 23 25   GLUCOSE 130*   < > 128* 108* 106* 126* 123*  BUN 12   < > 7 12 14  27* 23*  CREATININE 0.85   < > 0.76 1.09* 1.20* 1.34* 1.01*  CALCIUM 8.8*   < > 9.4 9.2 9.3 8.8* 8.7*  MG 1.7  --  1.6*  --  1.7 2.5* 2.4  PHOS  --   --  3.7  --  4.6 3.4 3.2   < > = values in this interval not displayed.   Estimated Creatinine Clearance: 64.1 mL/min (A) (by C-G formula based on SCr of 1.01 mg/dL (H)). Liver & Pancreas: Recent Labs  Lab 04/20/19 0654 04/20/19 2349 04/21/19 0412 04/22/19 0352 04/23/19 0427  AST  --  15  --   --   --   ALT  --  16  --   --   --   ALKPHOS  --  62  --   --   --   BILITOT  --  0.5  --   --   --   PROT  --  6.7  --   --   --   ALBUMIN 3.7 3.4* 3.5 3.1* 2.8*   No results for input(s): LIPASE, AMYLASE in the last 168 hours. Recent Labs  Lab 04/20/19 0654  AMMONIA 36*   Diabetic: No results for input(s): HGBA1C in the last 72 hours. Recent Labs  Lab 04/21/19 1715 04/22/19 1625 04/22/19 2047 04/23/19 0808 04/23/19 1238  GLUCAP 106* 97 121* 109* 83   Cardiac Enzymes: Recent Labs  Lab 04/21/19 1812  CKTOTAL 77   No results for input(s): PROBNP in the last 8760 hours. Coagulation Profile: No results for input(s): INR, PROTIME in the last 168 hours. Thyroid Function Tests: No results for input(s): TSH, T4TOTAL, FREET4, T3FREE, THYROIDAB in the last 72 hours. Lipid Profile: No results for input(s): CHOL, HDL, LDLCALC, TRIG, CHOLHDL, LDLDIRECT in the last 72 hours. Anemia Panel: No results for input(s): VITAMINB12, FOLATE, FERRITIN, TIBC, IRON, RETICCTPCT in the last 72 hours. Urine analysis:    Component Value Date/Time   COLORURINE AMBER (A) 04/21/2019 1254   APPEARANCEUR TURBID (A) 04/21/2019 1254   LABSPEC 1.014 04/21/2019 1254   LABSPEC 1.015  08/21/2018 1154   PHURINE 5.0 04/21/2019 1254   GLUCOSEU NEGATIVE 04/21/2019 1254   HGBUR LARGE (A) 04/21/2019 1254   BILIRUBINUR NEGATIVE 04/21/2019 1254   BILIRUBINUR negative 08/21/2018 1154   BILIRUBINUR n 06/02/2016 1251   KETONESUR 5 (A) 04/21/2019 1254   PROTEINUR 100 (A) 04/21/2019 1254   UROBILINOGEN negative 06/02/2016 1251   UROBILINOGEN 1.0 02/19/2014 1849   NITRITE NEGATIVE 04/21/2019 1254   LEUKOCYTESUR LARGE (A) 04/21/2019 1254   Sepsis Labs: Invalid input(s): PROCALCITONIN, [place]  Microbiology: Recent Results (from the past 240 hour(s))  Respiratory Panel by RT PCR (Flu A&B, Covid) - Nasopharyngeal Swab     Status: None   Collection Time: 04/16/19  2:11 AM   Specimen: Nasopharyngeal Swab  Result Value Ref Range Status   SARS Coronavirus 2 by RT PCR NEGATIVE NEGATIVE Final    Comment: (NOTE) SARS-CoV-2 target nucleic acids are NOT DETECTED. The SARS-CoV-2 RNA is generally detectable in upper respiratoy specimens during the acute phase of infection. The lowest concentration of SARS-CoV-2 viral copies this assay can detect is 131 copies/mL. A negative result does not preclude SARS-Cov-2 infection and should not be used as the sole basis for treatment or other patient management decisions. A negative result may occur with  improper specimen collection/handling, submission of specimen other than nasopharyngeal swab, presence of viral mutation(s) within the areas targeted by this assay, and inadequate number of viral copies (<131 copies/mL). A negative result must be combined with clinical observations, patient history, and epidemiological information. The expected result is Negative. Fact Sheet for Patients:  PinkCheek.be Fact Sheet for Healthcare Providers:  GravelBags.it This test is not yet ap proved or cleared by the [place] FDA and  has been authorized for detection and/or diagnosis of  SARS-CoV-2 by FDA under an Emergency Use Authorization (EUA). This EUA will remain  in effect (meaning this test can be used) for the duration of the COVID-19 declaration under Section 564(b)(1) of the Act, 21 U.S.C. section 360bbb-3(b)(1), unless the authorization is terminated or revoked sooner.    Influenza A by PCR NEGATIVE NEGATIVE Final   Influenza B by PCR NEGATIVE NEGATIVE Final    Comment: (NOTE) The Xpert  Xpress SARS-CoV-2/FLU/RSV assay is intended as an aid in  the diagnosis of influenza from Nasopharyngeal swab specimens and  should not be used as a sole basis for treatment. Nasal washings and  aspirates are unacceptable for Xpert Xpress SARS-CoV-2/FLU/RSV  testing. Fact Sheet for Patients: https://www.moore.com/ Fact Sheet for Healthcare Providers: https://www.young.biz/ This test is not yet approved or cleared by the [place] FDA and  has been authorized for detection and/or diagnosis of SARS-CoV-2 by  FDA under an Emergency Use Authorization (EUA). This EUA will remain  in effect (meaning this test can be used) for the duration of the  Covid-19 declaration under Section 564(b)(1) of the Act, 21  U.S.C. section 360bbb-3(b)(1), unless the authorization is  terminated or revoked. Performed at [facility], [address] [place], [place] [address]   Culture, Urine     Status: Abnormal   Collection Time: 04/21/19 12:54 PM   Specimen: Urine, Random  Result Value Ref Range Status   Specimen Description URINE, RANDOM  Final   Special Requests   Final    NONE Performed at [facility], [address], [place], [place] [address]    Culture MULTIPLE SPECIES PRESENT, SUGGEST RECOLLECTION (A)  Final   Report Status 04/22/2019 FINAL  Final  Culture, blood (routine x 2)     Status: None (Preliminary result)   Collection Time: 04/21/19  6:11 PM   Specimen: BLOOD RIGHT HAND  Result Value Ref Range Status   Specimen  Description BLOOD RIGHT HAND  Final   Special Requests   Final    BOTTLES DRAWN AEROBIC AND ANAEROBIC Blood Culture adequate volume   Culture   Final    NO GROWTH 2 DAYS Performed at [facility], [address], [place], [place] [address]    Report Status PENDING  Incomplete  Culture, blood (routine x 2)     Status: None (Preliminary result)   Collection Time: 04/21/19  9:30 PM   Specimen: BLOOD RIGHT HAND  Result Value Ref Range Status   Specimen Description BLOOD RIGHT HAND  Final   Special Requests   Final    BOTTLES DRAWN AEROBIC AND ANAEROBIC Blood Culture adequate volume   Culture   Final    NO GROWTH 1 DAY Performed at [facility], [address], [place], [place] [address]    Report Status PENDING  Incomplete    Radiology Studies: No results found.     T.  [facility]  If 7PM-7AM, please contact night-coverage [email or web address] Password [facility] 04/23/2019, 12:42 PM

## 2019-04-23 NOTE — Progress Notes (Addendum)
Initial Nutrition Assessment  INTERVENTION:   Monitor magnesium, potassium, and phosphorus daily for at least 3 days, MD to replete as needed, as pt is at risk for refeeding syndrome.  Once Cortrak placed: -Initiate Osmolite 1.5 @ 20 ml/hr, advance by 10 ml every 4 hours to goal rate of 50 ml/hr. -Recommend free water flushes of 200 ml every 6 hours (800 ml) -This regimen will provide 1800 kcals, 75g protein and 1714 ml H2O  -Will order Ensure Enlive po BID, each supplement provides 350 kcal and 20 grams of protein  ** 1500 Addendum: Placed TF orders, Cortrak tube placed.  NUTRITION DIAGNOSIS:   Inadequate oral intake related to lethargy/confusion as evidenced by meal completion < 25%.  GOAL:   Patient will meet greater than or equal to 90% of their needs  MONITOR:   Labs, Weight trends, PO intake, Supplement acceptance, TF tolerance, I & O's  REASON FOR ASSESSMENT:   Consult Enteral/tube feeding initiation and management, Assessment of nutrition requirement/status  ASSESSMENT:   59 year old female with history of HTN, depression and schizophrenia sent to ER from [facility] for elevated blood pressure.  Reportedly has been of care site medications for sometimes and taken to [facility] by family members.  1/18: admitted for hypokalemia and mild AKI  **RD working remotely**  Patient unable to provide history. Pt alert/oriented x 1. Pt with history of previous admissions d/t schizophrenia and poor PO intakes.  Since admission, pt has been refusing food/drink and medications. Will sometimes take in food if highly encouraged.  Cortrak has been ordered for tube feeds and medication administration.  Per weight records, pt's weight has remained stable.  Medications reviewed. Labs reviewed: CBGs: 109-121  NUTRITION - FOCUSED PHYSICAL EXAM:  Working remotely.  Diet Order:   Diet Order            Diet Heart Room service appropriate? Yes; Fluid consistency: Thin  Diet  effective now              EDUCATION NEEDS:   Not appropriate for education at this time  Skin:  Skin Assessment: Reviewed RN Assessment  Last BM:  1/21  Height:   Ht Readings from Last 1 Encounters:  11/06/18 5\' 10"  (1.778 m)    Weight:   Wt Readings from Last 1 Encounters:  04/19/19 76.8 kg    Ideal Body Weight:  68.1 kg  BMI:  Body mass index is 24.29 kg/m.  Estimated Nutritional Needs:   Kcal:  1750-1950  Protein:  80-90g  Fluid:  1.9L/day  04/21/19, MS, RD, LDN Inpatient Clinical Dietitian Pager: [phone] After Hours Pager: [phone]

## 2019-04-23 NOTE — TOC Progression Note (Signed)
Transition of Care [facility]) - Progression Note    Patient Details  Name: Amy Casey MRN: [id number] Date of Birth: 1960-09-10  Transition of Care [facility]) CM/SW Contact  Levada Schilling Phone Number: 04/23/2019, 3:55 PM  Clinical Narrative:     CSW was notified that pt's IVC ordered had expired.  CSW has completed paperwork to be signed by physician.  CSW has left messages through secure chat and paged physician to sign paperwork to be faxed to Magistrate.  Paperwork has been left at nurses station for physician signature.       Expected Discharge Plan and Services                                                 Social Determinants of Health (SDOH) Interventions    Readmission Risk Interventions No flowsheet data found.

## 2019-04-23 NOTE — TOC Progression Note (Signed)
Transition of Care [facility]) - Progression Note    Patient Details  Name: Amy Casey MRN: [id number] Date of Birth: 07-18-1960  Transition of Care [facility]) CM/SW Contact  Levada Schilling Phone Number: 04/23/2019, 5:26 PM  Clinical Narrative:    [employer] has been contacted to serve IVC papers to patient.  CSW will continue to follow for disposition planning.         Expected Discharge Plan and Services                                                 Social Determinants of Health (SDOH) Interventions    Readmission Risk Interventions No flowsheet data found.

## 2019-04-23 NOTE — Progress Notes (Signed)
Patient refused PO medications. Notified MD

## 2019-04-23 NOTE — Procedures (Signed)
Cortrak  Person Inserting Tube:  Valicia Rief, RD Tube Type:  Cortrak - 43 inches Tube Location:  Right nare Initial Placement:  Stomach Technique Used to Measure Tube Placement:  Documented cm marking at nare/ corner of mouth Cortrak Secured At:  72 cm   No x-ray is required. RN may begin using tube.   If the tube becomes dislodged please keep the tube and contact the Cortrak team at [email or web address] (password [email or web address]) for replacement.  If after hours and replacement cannot be delayed, place a NG tube and confirm placement with an abdominal x-ray.    Vanessa Kick RD, LDN Clinical Nutrition Pager # [phone]

## 2019-04-24 DIAGNOSIS — F200 Paranoid schizophrenia: Secondary | ICD-10-CM

## 2019-04-24 DIAGNOSIS — Z888 Allergy status to other drugs, medicaments and biological substances status: Secondary | ICD-10-CM

## 2019-04-24 DIAGNOSIS — I10 Essential (primary) hypertension: Secondary | ICD-10-CM

## 2019-04-24 DIAGNOSIS — A515 Early syphilis, latent: Secondary | ICD-10-CM

## 2019-04-24 DIAGNOSIS — E860 Dehydration: Secondary | ICD-10-CM

## 2019-04-24 DIAGNOSIS — A530 Latent syphilis, unspecified as early or late: Secondary | ICD-10-CM

## 2019-04-24 DIAGNOSIS — E876 Hypokalemia: Principal | ICD-10-CM

## 2019-04-24 DIAGNOSIS — R768 Other specified abnormal immunological findings in serum: Secondary | ICD-10-CM

## 2019-04-24 DIAGNOSIS — Z881 Allergy status to other antibiotic agents status: Secondary | ICD-10-CM

## 2019-04-24 LAB — GLUCOSE, CAPILLARY
Glucose-Capillary: 128 mg/dL — ABNORMAL HIGH (ref 70–99)
Glucose-Capillary: 134 mg/dL — ABNORMAL HIGH (ref 70–99)
Glucose-Capillary: 116 mg/dL — ABNORMAL HIGH (ref 70–99)
Glucose-Capillary: 119 mg/dL — ABNORMAL HIGH (ref 70–99)

## 2019-04-24 LAB — RENAL FUNCTION PANEL
Albumin: 2.8 g/dL — ABNORMAL LOW (ref 3.5–5.0)
Anion gap: 10 (ref 5–15)
BUN: 18 mg/dL (ref 6–20)
CO2: 22 mmol/L (ref 22–32)
Calcium: 8.8 mg/dL — ABNORMAL LOW (ref 8.9–10.3)
Chloride: 108 mmol/L (ref 98–111)
Creatinine, Ser: 0.77 mg/dL (ref 0.44–1.00)
GFR calc Af Amer: >60 mL/min (ref >60)
GFR calc non Af Amer: >60 mL/min (ref >60)
Glucose, Bld: 133 mg/dL — ABNORMAL HIGH (ref 70–99)
Phosphorus: 3.8 mg/dL (ref 2.5–4.6)
Potassium: 3.9 mmol/L (ref 3.5–5.1)
Sodium: 140 mmol/L (ref 135–145)

## 2019-04-24 LAB — MAGNESIUM
Magnesium: 2.3 mg/dL (ref 1.7–2.4)
Magnesium: 2.1 mg/dL (ref 1.7–2.4)

## 2019-04-24 LAB — CBC
HCT: 43.1 % (ref 36.0–46.0)
Hemoglobin: 13.7 g/dL (ref 12.0–15.0)
MCH: 29.3 pg (ref 26.0–34.0)
MCHC: 31.8 g/dL (ref 30.0–36.0)
MCV: 92.3 fL (ref 80.0–100.0)
Platelets: 332 10*3/uL (ref 150–400)
RBC: 4.67 MIL/uL (ref 3.87–5.11)
RDW: 12.2 % (ref 11.5–15.5)
WBC: 6.9 10*3/uL (ref 4.0–10.5)
nRBC: 0 % (ref 0.0–0.2)

## 2019-04-24 LAB — PHOSPHORUS: Phosphorus: 3.5 mg/dL (ref 2.5–4.6)

## 2019-04-24 MED ORDER — AMLODIPINE BESYLATE 10 MG PO TABS
10.0000 mg | ORAL_TABLET | Freq: Every day | ORAL | Status: DC
  Administered 2019-04-24 – 2019-05-02 (×9): 10 mg
  Filled 2019-04-24 (×9): qty 1

## 2019-04-24 MED ORDER — PENICILLIN G BENZATHINE 1200000 UNIT/2ML IM SUSP
2.4000 10*6.[IU] | Freq: Once | INTRAMUSCULAR | Status: AC
  Administered 2019-04-24: 2.4 10*6.[IU] via INTRAMUSCULAR
  Filled 2019-04-24 (×3): qty 4

## 2019-04-24 NOTE — Progress Notes (Signed)
PROGRESS NOTE  Amy Casey SAY:[id number] DOB: Oct 29, 1960   PCP: Patient, No Pcp Per  Patient is from: Home.  DOA: 04/15/2019 LOS: 8  Brief Narrative / Interim history: 59 year old female with history of HTN, depression and schizophrenia sent to ER from [facility] for elevated blood pressure.  Reportedly has been of care site medications for sometimes and taken to [facility] by family members.  Noted to have elevated blood pressure and sent to the ED.  In ED, she was noted to have elevated blood pressure, hypokalemia and AKI for which she was admitted.  IVC paperwork was completed and psychiatry was consulted.   Psychiatry made adjustments to his psych medications and recommended inpatient psych when medically stable.  Patient continued to refuse p.o. intake, care and medications. She also developed AKI and sepsis likely from UTI.  NG tube placed and tube feedings started on 04/23/2019.  Patient had positive RPR with low titer.  T. pallidum antibody positive.  Pharmacy called health department, and there was no documentation of previous treatment for syphilis.  Infectious disease consulted.  Subjective: No major events overnight or this morning.  Continues to refuse p.o. intake but she now has NG tube.  Not responding to my questions.  She is also somewhat drowsy.  Objective: Vitals:   04/23/19 0025 04/23/19 1500 04/23/19 1852 04/24/19 1156  BP:  (!) 152/102 (!) 156/78 (!) 159/93  Pulse:  (!) 114 82 76  Resp:      Temp:      TempSrc:      SpO2: 93%     Weight:        Intake/Output Summary (Last 24 hours) at 04/24/2019 1204 Last data filed at 04/24/2019 0300 Gross per 24 hour  Intake 1100.29 ml  Output --  Net 1100.29 ml   Filed Weights   04/18/19 0500 04/19/19 0523  Weight: 76.3 kg 76.8 kg    Examination:  GENERAL: No apparent distress.  Nontoxic. HEENT: MMM.  Vision and hearing grossly intact. TF via  NGT NECK: Supple.  No apparent JVD.  RESP:  No IWOB. Good air movement  bilaterally. CVS:  RRR. Heart sounds normal.  ABD/GI/GU: Bowel sounds present. Soft. Non tender.  MSK/EXT:  Moves extremities. No apparent deformity. No edema.  SKIN: no apparent skin lesion or wound NEURO: Somewhat drowsy but awakes easily.  No apparent focal neuro deficits. PSYCH: Calm and quiet.  Somewhat drowsy  Procedures:  None  Assessment & Plan: Sepsis likely due to UTI: patient had a UA concerning for UTI on admission but did not have signs and symptoms of infection until 1/24 when she spiked fever and became tachycardic.  Repeat UA concerning for UTI.  Urine culture with multiple species.  Blood cultures negative so far.  Repeat urine culture marked sent in computer but micro lab has not received the specimen.  -Continue empiric IV ceftriaxone 1/24> for a total of 5 days  Schizophrenia/major depressive disorder: continues to decline p.o. intake, care and medications. Psychiatry recommended 'medication over objection' which is appropriate.  She is already under involuntary commitment.  Discussed with patient's husband on 1/25.  NG tube placed and tube feed initiated on 1/26. -Continue Zyprexa 10 mg BID and Prozac 20 mg daily -P.o. or IM Haldol as needed -Optimize K and magnesium.  Intermittent EKG. -Patent attorney. -Psychiatry following.  AKI/azotemia: baseline Cr 0.7-0.8 >> 1.35 (admit) > 0.77.  Likely prerenal in the setting of poor p.o. intake.  AKI resolved. 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  HIV negative.  RPR was negative.  Months ago. -Infectious disease consulted-baseline 2.4 MU x1 and serum VDRL  Essential hypertension: Blood pressure slightly elevated. -Increased amlodipine to 10 mg -Discontinued clonidine patch-will cover with low-dose metoprolol -Given history of angioedema with ACEI and noncompliance -Continue as needed hydralazine  Hypernatremia:  Resolved.  Hypokalemia: Resolved.  Hypomagnesemia: Resolved.  Refusal of care: as above -Continue encouraging and medication over objection -Continue tube feed  Nutrition -Cortrak and tube feed started 1/26. -Add multivitamin -Appreciate dietitian input     Nutrition Problem: Inadequate oral intake Etiology: lethargy/confusion  Signs/Symptoms: meal completion < 25%  Interventions: Tube feeding, Ensure Enlive (each supplement provides 350kcal and 20 grams of protein)   DVT prophylaxis: Subcu Lovenox Code Status: Full code Family Communication: Updated patient's husband over the phone Disposition Plan: Patient is now medically stable but requiring NG tube for feeding and medication administration.  Psychiatry recommended inpatient psych. Need to clarify if TF can be continued at inpatient psych. Consultants: Psychiatry, ID   Microbiology summarized: COVID-19 negative. Influenza PCR negative. Blood culture negative Urine culture multiple species Repeat urine culture was not sent  Sch Meds:  Scheduled Meds: . amLODipine  10 mg Per Tube Daily  . FLUoxetine  20 mg Per Tube Daily  . metoprolol tartrate  25 mg Per Tube BID  . mirtazapine  7.5 mg Per Tube QHS  . OLANZapine  10 mg Per Tube BID  . penicillin g benzathine (BICILLIN-LA) IM  2.4 Million Units Intramuscular Once   Continuous Infusions: . feeding supplement (OSMOLITE 1.5 CAL) 50 mL/hr at 04/24/19 0719   PRN Meds:.acetaminophen **OR** acetaminophen, dextrose, metoprolol tartrate  Antimicrobials: Anti-infectives (From admission, onward)   Start     Dose/Rate Route Frequency Ordered Stop   04/24/19 1200  penicillin g benzathine (BICILLIN LA) 1200000 UNIT/2ML injection 2.4 Million Units     2.4 Million Units Intramuscular  Once 04/24/19 1121     04/21/19 1215  cefTRIAXone (ROCEPHIN) 1 g in sodium chloride 0.9 % 100 mL IVPB  Status:  Discontinued     1 g 200 mL/hr over 30 Minutes Intravenous Every 24 hours  04/21/19 1209 04/24/19 0948       I have personally reviewed the following labs and images: CBC: Recent Labs  Lab 04/18/19 1230 04/22/19 0352 04/23/19 0427 04/24/19 0516  WBC 8.2 14.1* 8.8 6.9  NEUTROABS  --  11.3*  --   --   HGB 14.2 14.3 13.6 13.7  HCT 44.7 44.2 43.8 43.1  MCV 90.9 90.0 92.6 92.3  PLT 333 286 279 332   BMP &GFR Recent Labs  Lab 04/20/19 0654 04/20/19 2349 04/21/19 0412 04/22/19 0352 04/23/19 0427 04/23/19 1540 04/24/19 0516  NA  --  140 141 140 142  --  140  K  --  4.2 4.3 4.6 4.3  --  3.9  CL  --  106 104 104 107  --  108  CO2  --  24 26 23 25   --  22  GLUCOSE  --  108* 106* 126* 123*  --  133*  BUN  --  12 14 27* 23*  --  18  CREATININE  --  1.09* 1.20* 1.34* 1.01*  --  0.77  CALCIUM  --  9.2 9.3 8.8* 8.7*  --  8.8*  MG   < >  --  1.7 2.5* 2.4 2.3 2.3  PHOS   < >  --  4.6 3.4 3.2 2.9 3.8   < > = values in this interval not displayed.   Estimated Creatinine Clearance: 80.9 mL/min (by C-G formula based on SCr of 0.77 mg/dL). Liver & Pancreas: Recent Labs  Lab 04/20/19 2349 04/21/19 0412 04/22/19 0352 04/23/19 0427 04/24/19 0516  AST 15  --   --   --   --   ALT 16  --   --   --   --   ALKPHOS 62  --   --   --   --   BILITOT 0.5  --   --   --   --   PROT 6.7  --   --   --   --   ALBUMIN 3.4* 3.5 3.1* 2.8* 2.8*   No results for input(s): LIPASE, AMYLASE in the last 168 hours. Recent Labs  Lab 04/20/19 0654  AMMONIA 36*   Diabetic: No results for input(s): HGBA1C in the last 72 hours. Recent Labs  Lab 04/23/19 1238 04/23/19 1659 04/23/19 2139 04/24/19 0804 04/24/19 1142  GLUCAP 83 110* 96 128* 134*   Cardiac Enzymes: Recent Labs  Lab 04/21/19 1812  CKTOTAL 77   No results for input(s): PROBNP in the last 8760 hours. Coagulation Profile: No results for input(s): INR, PROTIME in the last 168 hours. Thyroid Function Tests: No results for input(s): TSH, T4TOTAL, FREET4, T3FREE, THYROIDAB in the last 72 hours. Lipid  Profile: No results for input(s): CHOL, HDL, LDLCALC, TRIG, CHOLHDL, LDLDIRECT in the last 72 hours. Anemia Panel: No results for input(s): VITAMINB12, FOLATE, FERRITIN, TIBC, IRON, RETICCTPCT in the last 72 hours. Urine analysis:    Component Value Date/Time   COLORURINE AMBER (A) 04/21/2019 1254   APPEARANCEUR TURBID (A) 04/21/2019 1254   LABSPEC 1.014 04/21/2019 1254   LABSPEC 1.015 08/21/2018 1154   PHURINE 5.0 04/21/2019 1254   GLUCOSEU NEGATIVE 04/21/2019 1254   HGBUR LARGE (A) 04/21/2019 1254   BILIRUBINUR NEGATIVE 04/21/2019 1254   BILIRUBINUR negative 08/21/2018 1154   BILIRUBINUR n 06/02/2016 1251   KETONESUR 5 (A) 04/21/2019 1254   PROTEINUR 100 (A) 04/21/2019 1254   UROBILINOGEN negative 06/02/2016 1251   UROBILINOGEN 1.0 02/19/2014 1849   NITRITE NEGATIVE 04/21/2019 1254   LEUKOCYTESUR LARGE (A) 04/21/2019 1254   Sepsis Labs: Invalid input(s): PROCALCITONIN, LACTICIDVEN  Microbiology: Recent Results (from the past 240 hour(s))  Respiratory Panel by RT PCR (Flu A&B, Covid) - Nasopharyngeal Swab     Status: None   Collection Time: 04/16/19  2:11 AM   Specimen: Nasopharyngeal Swab  Result Value Ref Range Status   SARS Coronavirus 2 by RT PCR NEGATIVE NEGATIVE Final    Comment: (NOTE) SARS-CoV-2 target nucleic acids are NOT DETECTED. The SARS-CoV-2 RNA is generally detectable in upper respiratoy specimens during the acute phase of infection. The lowest concentration of SARS-CoV-2 viral copies this assay can detect is 131 copies/mL. A negative result does not preclude SARS-Cov-2 infection and should not be used as the sole basis for treatment or other patient management decisions. A negative result may occur with  improper specimen collection/handling, submission of specimen other than nasopharyngeal swab, presence of viral mutation(s) within the areas targeted by this assay, and inadequate number of viral copies (<131 copies/mL). A negative result must be  combined with clinical observations, patient history, and epidemiological information. The expected result is Negative. Fact Sheet for Patients:  https://www.moore.com/ Fact Sheet for Healthcare Providers:  https://www.young.biz/ This test is not yet ap proved or cleared by the [place]  FDA and  has been authorized for detection and/or diagnosis of SARS-CoV-2 by FDA under an Emergency Use Authorization (EUA). This EUA will remain  in effect (meaning this test can be used) for the duration of the COVID-19 declaration under Section 564(b)(1) of the Act, 21 U.S.C. section 360bbb-3(b)(1), unless the authorization is terminated or revoked sooner.    Influenza A by PCR NEGATIVE NEGATIVE Final   Influenza B by PCR NEGATIVE NEGATIVE Final    Comment: (NOTE) The Xpert Xpress SARS-CoV-2/FLU/RSV assay is intended as an aid in  the diagnosis of influenza from Nasopharyngeal swab specimens and  should not be used as a sole basis for treatment. Nasal washings and  aspirates are unacceptable for Xpert Xpress SARS-CoV-2/FLU/RSV  testing. Fact Sheet for Patients: https://www.moore.com/ Fact Sheet for Healthcare Providers: https://www.young.biz/ This test is not yet approved or cleared by the [place] FDA and  has been authorized for detection and/or diagnosis of SARS-CoV-2 by  FDA under an Emergency Use Authorization (EUA). This EUA will remain  in effect (meaning this test can be used) for the duration of the  Covid-19 declaration under Section 564(b)(1) of the Act, 21  U.S.C. section 360bbb-3(b)(1), unless the authorization is  terminated or revoked. Performed at [facility], [address] [place], [place] [address]   Culture, Urine     Status: Abnormal   Collection Time: 04/21/19 12:54 PM   Specimen: Urine, Random  Result Value Ref Range Status   Specimen Description URINE, RANDOM  Final    Special Requests   Final    NONE Performed at [facility], [address] [name], [place] [address]    Culture MULTIPLE SPECIES PRESENT, SUGGEST RECOLLECTION (A)  Final   Report Status 04/22/2019 FINAL  Final  Culture, blood (routine x 2)     Status: None (Preliminary result)   Collection Time: 04/21/19  6:11 PM   Specimen: BLOOD RIGHT HAND  Result Value Ref Range Status   Specimen Description BLOOD RIGHT HAND  Final   Special Requests   Final    BOTTLES DRAWN AEROBIC AND ANAEROBIC Blood Culture adequate volume   Culture   Final    NO GROWTH 3 DAYS Performed at [facility], [address], [place], [place] [address]    Report Status PENDING  Incomplete  Culture, blood (routine x 2)     Status: None (Preliminary result)   Collection Time: 04/21/19  9:30 PM   Specimen: BLOOD RIGHT HAND  Result Value Ref Range Status   Specimen Description BLOOD RIGHT HAND  Final   Special Requests   Final    BOTTLES DRAWN AEROBIC AND ANAEROBIC Blood Culture adequate volume   Culture   Final    NO GROWTH 2 DAYS Performed at [facility], [address] [place], [place] [address]    Report Status PENDING  Incomplete    Radiology Studies: No results found.    Amy Casey T. Destry Dauber [facility]  If 7PM-7AM, please contact night-coverage [email or web address] Password [facility] 04/24/2019, 12:04 PM

## 2019-04-24 NOTE — Consult Note (Signed)
[facility]    Date of Admission:  04/15/2019     Total days of antibiotics 0                Reason for Consult: + RPR     Referring Provider: Alanda Slim  Primary Care Provider: Patient, No Pcp Per   Assessment: Amy Casey is a 59 y.o. female with uncontrolled paranoid schizophrenia here for medical admission due to dehydration/poor oral intake now requiring enteral feeds and medications.   Her RPR is now reactive at a low titer of 1:1 with positive treponemal Ab test confirming previous exposure. It sounds like she received treatment at that time but unclear as to how many shots she had nor if she had symptoms. No records at [facility] state lab indicating other positive RPRs prior to this one.   It is possible that this reflects a false positive RPR with a very weak titer of 1:1, especially with 8 months ago this being negative -- this can be seen in certain conditions, pregnancy for example. Could try to check an alternative non-treponemal test to see if that is also positive. It sounds like she was treated in the past, which explains the positive treponemal test; however no records to support history.    The only way to exclude CNS involvement is with LP and CSF VDRL, however I do not think she will let us obtain this. Given that she had a negative RPR 8 months ago and has a very weakly positive titer will administer one 2.4 million unit intramuscular injection of bicillin to be on safe side to treat for possible early latent infection.     Plan: 1. Serum VDRL 2. Bicillin 2.4 million units gluteal injection x 1    Principal Problem:   Hypokalemia Active Problems:   Essential hypertension, benign   Noncompliance   Involuntary commitment   Schizophrenia (HCC)   AKI (acute kidney injury) (HCC)   Encephalopathy   . amLODipine  10 mg Per Tube Daily  . enoxaparin (LOVENOX) injection  40 mg Subcutaneous Daily  . FLUoxetine  20 mg Per Tube Daily    . metoprolol tartrate  25 mg Per Tube BID  . mirtazapine  7.5 mg Per Tube QHS  . OLANZapine  10 mg Per Tube BID    HPI: Amy Casey is a 59 y.o. female admitted under IVC for schizophrenia and refusal of medications. She was found to be hypokalemic and hypertensive and transferred to medical hospital.   She presently has a Cortrak in place for enteral feeds and medication access.   I called to discuss with her husband Carmina's schizophrenia history. She was diagnosed prior to their meeting but have been together 20 years. In discussing with Antonette she tells me she was diagnosed around the age of 59 years old and has been on and off medications for this. She has had several involuntary admissions over the course of the past few years due to exacerbation of schizophrenia and complications of lack of self care (dehydration, electrolyte derangements, malnutrition).   8 months ago had a negative RPR. She also describes that she was treated for syphilis when she was younger but she cannot recall specifically when. During present admission she has a positive RPR with low titer 1:1, confirmatory treponemal test is positive indicating past exposure. [place] lab has no further information on previous titers for her. She has a husband but did not disclose  upon any other sexual history and it is unclear if she is sexually active with him at present over the last 8 months.   When we discussed working this up further with lumbar puncture to ensure no neurosyphilis she stated "absolutely not."   She had a slight fever twice with UA possibly indicating a UTI, however unable to confirm with the patient symptoms to support active infection. She has received 3 days of ceftriaxone for treatment.    Review of Systems: Review of Systems  Unable to perform ROS: Psychiatric disorder    Past Medical History:  Diagnosis Date  . Depression    Dr. Silvio Pate, [place]; hospitalization 07/2010  . History of  echocardiogram 2011   [facility]  . History of renal stone   . Hypertension   . Hypokalemia   . Nephrolithiasis   . Obesity   . Schizophrenia ([place])   . Wears glasses     Social History   Tobacco Use  . Smoking status: Never Smoker  . Smokeless tobacco: Never Used  Substance Use Topics  . Alcohol use: No  . Drug use: No    Family History  Problem Relation Age of Onset  . Diabetes Mother   . Heart disease Mother   . Chronic Renal Failure Mother   . Cancer Maternal Uncle        lung  . Kidney disease Mother        dialysis  . Stroke Neg Hx   . Hypertension Neg Hx   . Hyperlipidemia Neg Hx    Allergies  Allergen Reactions  . Paliperidone Other (See Comments)    Angioedema, drooling, slurred speech, ptosis  . Lisinopril Rash and Other (See Comments)    Angioedema   . Sulfa Antibiotics Itching    OBJECTIVE: Blood pressure (!) 156/78, pulse 82, temperature 98.1 F (36.7 C), temperature source Oral, resp. rate 20, weight 76.8 kg, SpO2 93 %.  Physical Exam Constitutional:      Comments: Drowsy in bed. Falls asleep and mumbles often but does answer most questions asked.   HENT:     Mouth/Throat:     Mouth: Mucous membranes are moist.     Pharynx: Oropharynx is clear.  Eyes:     General: No scleral icterus.    Pupils: Pupils are equal, round, and reactive to light.  Cardiovascular:     Rate and Rhythm: Normal rate and regular rhythm.     Heart sounds: No murmur.  Pulmonary:     Effort: Pulmonary effort is normal.     Breath sounds: Normal breath sounds.  Abdominal:     General: Bowel sounds are normal.     Palpations: Abdomen is soft.  Musculoskeletal:        General: Normal range of motion.     Cervical back: Normal range of motion. No rigidity or tenderness.  Skin:    General: Skin is warm and dry.     Capillary Refill: Capillary refill takes less than 2 seconds.  Neurological:     Comments: Difficult to gauge     Lab Results Lab Results  Component  Value Date   WBC 6.9 04/24/2019   HGB 13.7 04/24/2019   HCT 43.1 04/24/2019   MCV 92.3 04/24/2019   PLT 332 04/24/2019    Lab Results  Component Value Date   CREATININE 0.77 04/24/2019   BUN 18 04/24/2019   NA 140 04/24/2019   K 3.9 04/24/2019   CL 108 04/24/2019   CO2  22 04/24/2019    Lab Results  Component Value Date   ALT 16 04/20/2019   AST 15 04/20/2019   ALKPHOS 62 04/20/2019   BILITOT 0.5 04/20/2019     Microbiology: Recent Results (from the past 240 hour(s))  Respiratory Panel by RT PCR (Flu A&B, Covid) - Nasopharyngeal Swab     Status: None   Collection Time: 04/16/19  2:11 AM   Specimen: Nasopharyngeal Swab  Result Value Ref Range Status   SARS Coronavirus 2 by RT PCR NEGATIVE NEGATIVE Final    Comment: (NOTE) SARS-CoV-2 target nucleic acids are NOT DETECTED. The SARS-CoV-2 RNA is generally detectable in upper respiratoy specimens during the acute phase of infection. The lowest concentration of SARS-CoV-2 viral copies this assay can detect is 131 copies/mL. A negative result does not preclude SARS-Cov-2 infection and should not be used as the sole basis for treatment or other patient management decisions. A negative result may occur with  improper specimen collection/handling, submission of specimen other than nasopharyngeal swab, presence of viral mutation(s) within the areas targeted by this assay, and inadequate number of viral copies (<131 copies/mL). A negative result must be combined with clinical observations, patient history, and epidemiological information. The expected result is Negative. Fact Sheet for Patients:  https://www.moore.com/ Fact Sheet for Healthcare Providers:  https://www.young.biz/ This test is not yet ap proved or cleared by the [place] FDA and  has been authorized for detection and/or diagnosis of SARS-CoV-2 by FDA under an Emergency Use Authorization (EUA). This EUA will remain  in  effect (meaning this test can be used) for the duration of the COVID-19 declaration under Section 564(b)(1) of the Act, 21 U.S.C. section 360bbb-3(b)(1), unless the authorization is terminated or revoked sooner.    Influenza A by PCR NEGATIVE NEGATIVE Final   Influenza B by PCR NEGATIVE NEGATIVE Final    Comment: (NOTE) The Xpert Xpress SARS-CoV-2/FLU/RSV assay is intended as an aid in  the diagnosis of influenza from Nasopharyngeal swab specimens and  should not be used as a sole basis for treatment. Nasal washings and  aspirates are unacceptable for Xpert Xpress SARS-CoV-2/FLU/RSV  testing. Fact Sheet for Patients: https://www.moore.com/ Fact Sheet for Healthcare Providers: https://www.young.biz/ This test is not yet approved or cleared by the [place] FDA and  has been authorized for detection and/or diagnosis of SARS-CoV-2 by  FDA under an Emergency Use Authorization (EUA). This EUA will remain  in effect (meaning this test can be used) for the duration of the  Covid-19 declaration under Section 564(b)(1) of the Act, 21  U.S.C. section 360bbb-3(b)(1), unless the authorization is  terminated or revoked. Performed at [facility], [address] [place], [place] [address]   Culture, Urine     Status: Abnormal   Collection Time: 04/21/19 12:54 PM   Specimen: Urine, Random  Result Value Ref Range Status   Specimen Description URINE, RANDOM  Final   Special Requests   Final    NONE Performed at [facility], [address], [place], [place] [address]    Culture MULTIPLE SPECIES PRESENT, SUGGEST RECOLLECTION (A)  Final   Report Status 04/22/2019 FINAL  Final  Culture, blood (routine x 2)     Status: None (Preliminary result)   Collection Time: 04/21/19  6:11 PM   Specimen: BLOOD RIGHT HAND  Result Value Ref Range Status   Specimen Description BLOOD RIGHT HAND  Final   Special Requests   Final    BOTTLES DRAWN AEROBIC  AND ANAEROBIC Blood Culture adequate volume   Culture   Final    NO GROWTH 2 DAYS Performed at [facility], [address], [place], [place] [address]    Report Status PENDING  Incomplete  Culture, blood (routine x 2)     Status: None (Preliminary result)   Collection Time: 04/21/19  9:30 PM   Specimen: BLOOD RIGHT HAND  Result Value Ref Range Status   Specimen Description BLOOD RIGHT HAND  Final   Special Requests   Final    BOTTLES DRAWN AEROBIC AND ANAEROBIC Blood Culture adequate volume   Culture   Final    NO GROWTH 1 DAY Performed at [facility], [address] [place], [place] [address]    Report Status PENDING  Incomplete    Rexene Alberts, MSN, NP-C [facility] Cell: [phone] Pager: [phone]  04/24/2019 9:09 AM

## 2019-04-25 LAB — BASIC METABOLIC PANEL
Anion gap: 14 (ref 5–15)
BUN: 19 mg/dL (ref 6–20)
CO2: 27 mmol/L (ref 22–32)
Calcium: 9.2 mg/dL (ref 8.9–10.3)
Chloride: 103 mmol/L (ref 98–111)
Creatinine, Ser: 0.8 mg/dL (ref 0.44–1.00)
GFR calc Af Amer: >60 mL/min (ref >60)
GFR calc non Af Amer: >60 mL/min (ref >60)
Glucose, Bld: 127 mg/dL — ABNORMAL HIGH (ref 70–99)
Potassium: 3.6 mmol/L (ref 3.5–5.1)
Sodium: 144 mmol/L (ref 135–145)

## 2019-04-25 LAB — GLUCOSE, CAPILLARY
Glucose-Capillary: 124 mg/dL — ABNORMAL HIGH (ref 70–99)
Glucose-Capillary: 113 mg/dL — ABNORMAL HIGH (ref 70–99)
Glucose-Capillary: 104 mg/dL — ABNORMAL HIGH (ref 70–99)
Glucose-Capillary: 100 mg/dL — ABNORMAL HIGH (ref 70–99)
Glucose-Capillary: 96 mg/dL (ref 70–99)
Glucose-Capillary: 106 mg/dL — ABNORMAL HIGH (ref 70–99)

## 2019-04-25 LAB — CBC
HCT: 44.3 % (ref 36.0–46.0)
Hemoglobin: 14.1 g/dL (ref 12.0–15.0)
MCH: 28.8 pg (ref 26.0–34.0)
MCHC: 31.8 g/dL (ref 30.0–36.0)
MCV: 90.4 fL (ref 80.0–100.0)
Platelets: 389 10*3/uL (ref 150–400)
RBC: 4.9 MIL/uL (ref 3.87–5.11)
RDW: 12.2 % (ref 11.5–15.5)
WBC: 5.5 10*3/uL (ref 4.0–10.5)
nRBC: 0 % (ref 0.0–0.2)

## 2019-04-25 LAB — MAGNESIUM: Magnesium: 2.2 mg/dL (ref 1.7–2.4)

## 2019-04-25 LAB — PHOSPHORUS: Phosphorus: 4.1 mg/dL (ref 2.5–4.6)

## 2019-04-25 MED ORDER — ENOXAPARIN SODIUM 40 MG/0.4ML ~~LOC~~ SOLN
40.0000 mg | SUBCUTANEOUS | Status: DC
  Administered 2019-04-25 – 2019-05-04 (×7): 40 mg via SUBCUTANEOUS
  Filled 2019-04-25 (×9): qty 0.4

## 2019-04-25 MED ORDER — ADULT MULTIVITAMIN LIQUID CH
15.0000 mL | Freq: Every day | ORAL | Status: DC
  Administered 2019-04-25 – 2019-05-02 (×7): 15 mL
  Filled 2019-04-25 (×9): qty 15

## 2019-04-25 NOTE — Progress Notes (Signed)
PROGRESS NOTE  Harvel QualeBeverly Cerda UEA:[id number] DOB: 01/05/1961   PCP: Patient, No Pcp Per  Patient is from: Home.  DOA: 04/15/2019 LOS: 9  Brief Narrative / Interim history: 59 year old female with history of HTN, depression and schizophrenia sent to ER from [facility] for elevated blood pressure.  Reportedly has been of care site medications for sometimes and taken [facility] by family members.  Noted to have elevated blood pressure and sent to the ED.  In ED, she was noted to have elevated blood pressure, hypokalemia and AKI for which she was admitted.  IVC paperwork was completed and psychiatry was consulted.   Psychiatry made adjustments to his psych medications and recommended inpatient psych when medically stable.  Patient continued to refuse p.o. intake, care and medications. She also developed AKI and sepsis likely from UTI.  NG tube placed and tube feedings started on 04/23/2019.  Patient had positive RPR with low titer.  T. pallidum antibody positive.  Pharmacy called health department, and there was no documentation of previous treatment for syphilis.  Infectious disease consulted and recommended Bicillin injection x1.  Subjective: No major events overnight or this morning.  She is more interactive this morning.  Reports discomfort from NG tube.  Denies chest pain, abdominal pain or UTI symptoms. Still very poor insight.  Does not understand why she is in the hospital.  Continues to refuse p.o. intake.  Objective: Vitals:   04/24/19 2258 04/25/19 0501 04/25/19 0800 04/25/19 1055  BP: 130/89 135/70  138/79  Pulse: 71 65    Resp:  16 (!) 22 14  Temp:  (!) 97.5 F (36.4 C)    TempSrc:  Oral    SpO2:  98%    Weight:        Intake/Output Summary (Last 24 hours) at 04/25/2019 1221 Last data filed at 04/25/2019 0900 Gross per 24 hour  Intake 1605.33 ml  Output 1000 ml  Net 605.33 ml   Filed Weights   04/18/19 0500 04/19/19 0523  Weight: 76.3 kg 76.8 kg     Examination:  GENERAL: No acute distress.  Appears well.  HEENT: MMM.  Vision and hearing grossly intact. TF via NGT NECK: Supple.  No apparent JVD.  RESP:  No IWOB. Good air movement bilaterally. CVS:  RRR. Heart sounds normal.  ABD/GI/GU: Bowel sounds present. Soft. Non tender.  MSK/EXT:  Moves extremities. No apparent deformity. No edema.  SKIN: no apparent skin lesion or wound NEURO: Awake. Speech clear but low tone. No apparent focal neuro deficit. PSYCH: Calm. Normal affect.  Procedures:  None  Assessment & Plan: Sepsis likely due to UTI: patient had a UA concerning for UTI on admission but did not have signs and symptoms of infection until 1/24 when she spiked fever and became tachycardic.  Repeat UA concerning for UTI.  Urine culture with multiple species.  Blood cultures negative so far.  Repeat urine culture marked sent in computer but micro lab has not received the specimen.  -Continue empiric IV ceftriaxone 1/24> for a total of 5 days  Schizophrenia/major depressive disorder: continues to decline p.o. intake, care and medications. Psychiatry recommended 'medication over objection' which is appropriate.  She is already under involuntary commitment.  Discussed with patient's husband on 1/25.  NG tube placed and tube feed initiated on 1/26. -Continue Zyprexa 10 mg BID and Prozac 20 mg daily -P.o. or IM Haldol as needed -Optimize K and magnesium.  Intermittent EKG. -Patent attorneyContinue safety sitter. -Psychiatry following.  AKI/azotemia: baseline Cr 0.7-0.8 >>  1.35 (admit) > 0.80.  Likely prerenal in the setting of poor p.o. intake.  AKI resolved. 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  HIV negative.  RPR was negative.  Months ago. -Infectious disease consulted-Bicillin 2.4 MU x1 on 1/27,  and serum VDRL  Essential hypertension: Normotensive. -Continue amlodipine 10 mg daily, metoprolol 25 mg twice daily and as  needed hydralazine. -Given history of angioedema with ACEI and noncompliance  Hypernatremia: Resolved.  Hypokalemia: Resolved.  Hypomagnesemia: Resolved.  Refusal of care/p.o. intake: Ongoing issue. -Continue encouraging and medication over objection -Continue tube feed  Nutrition -Cortrak and tube feed started 1/26. -Multivitamin -Appreciate dietitian input     Nutrition Problem: Inadequate oral intake Etiology: lethargy/confusion  Signs/Symptoms: meal completion < 25%  Interventions: Tube feeding, Ensure Enlive (each supplement provides 350kcal and 20 grams of protein)   DVT prophylaxis: Subcu Lovenox Code Status: Full code Family Communication: Updated patient's husband over the phone on 1/27 Disposition Plan: Patient is now medically stable but requiring NG tube for feeding and medication administration.  Psychiatry recommended inpatient psych. Need to clarify if TF can be continued at inpatient psych.  Consultants: Psychiatry, ID   Microbiology summarized: COVID-19 negative. Influenza PCR negative. Blood culture negative Urine culture multiple species Repeat urine culture was not sent  Sch Meds:  Scheduled Meds: . amLODipine  10 mg Per Tube Daily  . enoxaparin (LOVENOX) injection  40 mg Subcutaneous Q24H  . FLUoxetine  20 mg Per Tube Daily  . metoprolol tartrate  25 mg Per Tube BID  . OLANZapine  10 mg Per Tube BID   Continuous Infusions: . feeding supplement (OSMOLITE 1.5 CAL) 1,000 mL (04/24/19 2303)   PRN Meds:.acetaminophen **OR** acetaminophen, dextrose, metoprolol tartrate  Antimicrobials: Anti-infectives (From admission, onward)   Start     Dose/Rate Route Frequency Ordered Stop   04/24/19 1200  penicillin g benzathine (BICILLIN LA) 1200000 UNIT/2ML injection 2.4 Million Units     2.4 Million Units Intramuscular  Once 04/24/19 1121 04/24/19 1231   04/21/19 1215  cefTRIAXone (ROCEPHIN) 1 g in sodium chloride 0.9 % 100 mL IVPB  Status:   Discontinued     1 g 200 mL/hr over 30 Minutes Intravenous Every 24 hours 04/21/19 1209 04/24/19 0948       I have personally reviewed the following labs and images: CBC: Recent Labs  Lab 04/18/19 1230 04/22/19 0352 04/23/19 0427 04/24/19 0516 04/25/19 0455  WBC 8.2 14.1* 8.8 6.9 5.5  NEUTROABS  --  11.3*  --   --   --   HGB 14.2 14.3 13.6 13.7 14.1  HCT 44.7 44.2 43.8 43.1 44.3  MCV 90.9 90.0 92.6 92.3 90.4  PLT 333 286 279 332 389   BMP &GFR Recent Labs  Lab 04/21/19 0412 04/21/19 0412 04/22/19 0352 04/22/19 0352 04/23/19 0427 04/23/19 1540 04/24/19 0516 04/24/19 1642 04/25/19 0455  NA 141  --  140  --  142  --  140  --  144  K 4.3  --  4.6  --  4.3  --  3.9  --  3.6  CL 104  --  104  --  107  --  108  --  103  CO2 26  --  23  --  25  --  22  --  27  GLUCOSE 106*  --  126*  --  123*  --  133*  --  127*  BUN 14  --  27*  --  23*  --  18  --  19  CREATININE 1.20*  --  1.34*  --  1.01*  --  0.77  --  0.80  CALCIUM 9.3  --  8.8*  --  8.7*  --  8.8*  --  9.2  MG 1.7   < > 2.5*   < > 2.4 2.3 2.3 2.1 2.2  PHOS 4.6   < > 3.4   < > 3.2 2.9 3.8 3.5 4.1   < > = values in this interval not displayed.   Estimated Creatinine Clearance: 80.9 mL/min (by C-G formula based on SCr of 0.8 mg/dL). Liver & Pancreas: Recent Labs  Lab 04/20/19 2349 04/21/19 0412 04/22/19 0352 04/23/19 0427 04/24/19 0516  AST 15  --   --   --   --   ALT 16  --   --   --   --   ALKPHOS 62  --   --   --   --   BILITOT 0.5  --   --   --   --   PROT 6.7  --   --   --   --   ALBUMIN 3.4* 3.5 3.1* 2.8* 2.8*   No results for input(s): LIPASE, AMYLASE in the last 168 hours. Recent Labs  Lab 04/20/19 0654  AMMONIA 36*   Diabetic: No results for input(s): HGBA1C in the last 72 hours. Recent Labs  Lab 04/24/19 1556 04/24/19 1952 04/25/19 0011 04/25/19 0457 04/25/19 0749  GLUCAP 116* 119* 124* 113* 104*   Cardiac Enzymes: Recent Labs  Lab 04/21/19 1812  CKTOTAL 77   No results for  input(s): PROBNP in the last 8760 hours. Coagulation Profile: No results for input(s): INR, PROTIME in the last 168 hours. Thyroid Function Tests: No results for input(s): TSH, T4TOTAL, FREET4, T3FREE, THYROIDAB in the last 72 hours. Lipid Profile: No results for input(s): CHOL, HDL, LDLCALC, TRIG, CHOLHDL, LDLDIRECT in the last 72 hours. Anemia Panel: No results for input(s): VITAMINB12, FOLATE, FERRITIN, TIBC, IRON, RETICCTPCT in the last 72 hours. Urine analysis:    Component Value Date/Time   COLORURINE AMBER (A) 04/21/2019 1254   APPEARANCEUR TURBID (A) 04/21/2019 1254   LABSPEC 1.014 04/21/2019 1254   LABSPEC 1.015 08/21/2018 1154   PHURINE 5.0 04/21/2019 1254   GLUCOSEU NEGATIVE 04/21/2019 1254   HGBUR LARGE (A) 04/21/2019 1254   BILIRUBINUR NEGATIVE 04/21/2019 1254   BILIRUBINUR negative 08/21/2018 1154   BILIRUBINUR n 06/02/2016 1251   KETONESUR 5 (A) 04/21/2019 1254   PROTEINUR 100 (A) 04/21/2019 1254   UROBILINOGEN negative 06/02/2016 1251   UROBILINOGEN 1.0 02/19/2014 1849   NITRITE NEGATIVE 04/21/2019 1254   LEUKOCYTESUR LARGE (A) 04/21/2019 1254   Sepsis Labs: Invalid input(s): PROCALCITONIN, [place]  Microbiology: Recent Results (from the past 240 hour(s))  Respiratory Panel by RT PCR (Flu A&B, Covid) - Nasopharyngeal Swab     Status: None   Collection Time: 04/16/19  2:11 AM   Specimen: Nasopharyngeal Swab  Result Value Ref Range Status   SARS Coronavirus 2 by RT PCR NEGATIVE NEGATIVE Final    Comment: (NOTE) SARS-CoV-2 target nucleic acids are NOT DETECTED. The SARS-CoV-2 RNA is generally detectable in upper respiratoy specimens during the acute phase of infection. The lowest concentration of SARS-CoV-2 viral copies this assay can detect is 131 copies/mL. A negative result does not preclude SARS-Cov-2 infection and should not be used as the sole basis for treatment or other patient management decisions. A negative  result may occur with  improper  specimen collection/handling, submission of specimen other than nasopharyngeal swab, presence of viral mutation(s) within the areas targeted by this assay, and inadequate number of viral copies (<131 copies/mL). A negative result must be combined with clinical observations, patient history, and epidemiological information. The expected result is Negative. Fact Sheet for Patients:  https://www.moore.com/ Fact Sheet for Healthcare Providers:  https://www.young.biz/ This test is not yet ap proved or cleared by the [place] FDA and  has been authorized for detection and/or diagnosis of SARS-CoV-2 by FDA under an Emergency Use Authorization (EUA). This EUA will remain  in effect (meaning this test can be used) for the duration of the COVID-19 declaration under Section 564(b)(1) of the Act, 21 U.S.C. section 360bbb-3(b)(1), unless the authorization is terminated or revoked sooner.    Influenza A by PCR NEGATIVE NEGATIVE Final   Influenza B by PCR NEGATIVE NEGATIVE Final    Comment: (NOTE) The Xpert Xpress SARS-CoV-2/FLU/RSV assay is intended as an aid in  the diagnosis of influenza from Nasopharyngeal swab specimens and  should not be used as a sole basis for treatment. Nasal washings and  aspirates are unacceptable for Xpert Xpress SARS-CoV-2/FLU/RSV  testing. Fact Sheet for Patients: https://www.moore.com/ Fact Sheet for Healthcare Providers: https://www.young.biz/ This test is not yet approved or cleared by the [place] FDA and  has been authorized for detection and/or diagnosis of SARS-CoV-2 by  FDA under an Emergency Use Authorization (EUA). This EUA will remain  in effect (meaning this test can be used) for the duration of the  Covid-19 declaration under Section 564(b)(1) of the Act, 21  U.S.C. section 360bbb-3(b)(1), unless the authorization is  terminated or revoked. Performed at [facility], [address], [place], [place] [address]   Culture, Urine     Status: Abnormal   Collection Time: 04/21/19 12:54 PM   Specimen: Urine, Random  Result Value Ref Range Status   Specimen Description URINE, RANDOM  Final   Special Requests   Final    NONE Performed at [facility], [address] [place], [place] [address]    Culture MULTIPLE SPECIES PRESENT, SUGGEST RECOLLECTION (A)  Final   Report Status 04/22/2019 FINAL  Final  Culture, blood (routine x 2)     Status: None (Preliminary result)   Collection Time: 04/21/19  6:11 PM   Specimen: BLOOD RIGHT HAND  Result Value Ref Range Status   Specimen Description BLOOD RIGHT HAND  Final   Special Requests   Final    BOTTLES DRAWN AEROBIC AND ANAEROBIC Blood Culture adequate volume   Culture   Final    NO GROWTH 4 DAYS Performed at [facility], [address] [place], [place] [address]    Report Status PENDING  Incomplete  Culture, blood (routine x 2)     Status: None (Preliminary result)   Collection Time: 04/21/19  9:30 PM   Specimen: BLOOD RIGHT HAND  Result Value Ref Range Status   Specimen Description BLOOD RIGHT HAND  Final   Special Requests   Final    BOTTLES DRAWN AEROBIC AND ANAEROBIC Blood Culture adequate volume   Culture   Final    NO GROWTH 3 DAYS Performed at [facility], [address], [place], [place] [address]    Report Status PENDING  Incomplete    Radiology Studies: No results found.    Warnell Rasnic T. Jaimee Corum [facility]  If 7PM-7AM, please contact night-coverage [email or web address]  Password [email or web address] 04/25/2019, 12:21 PM

## 2019-04-26 LAB — URINE CULTURE
Culture: <10000 — AB
Special Requests: NORMAL

## 2019-04-26 LAB — RENAL FUNCTION PANEL
Albumin: 2.9 g/dL — ABNORMAL LOW (ref 3.5–5.0)
Anion gap: 13 (ref 5–15)
BUN: 22 mg/dL — ABNORMAL HIGH (ref 6–20)
CO2: 26 mmol/L (ref 22–32)
Calcium: 9.2 mg/dL (ref 8.9–10.3)
Chloride: 104 mmol/L (ref 98–111)
Creatinine, Ser: 0.82 mg/dL (ref 0.44–1.00)
GFR calc Af Amer: >60 mL/min (ref >60)
GFR calc non Af Amer: >60 mL/min (ref >60)
Glucose, Bld: 117 mg/dL — ABNORMAL HIGH (ref 70–99)
Phosphorus: 4.4 mg/dL (ref 2.5–4.6)
Potassium: 4.3 mmol/L (ref 3.5–5.1)
Sodium: 143 mmol/L (ref 135–145)

## 2019-04-26 LAB — CBC
HCT: 43.7 % (ref 36.0–46.0)
Hemoglobin: 13.8 g/dL (ref 12.0–15.0)
MCH: 28.9 pg (ref 26.0–34.0)
MCHC: 31.6 g/dL (ref 30.0–36.0)
MCV: 91.6 fL (ref 80.0–100.0)
Platelets: 403 10*3/uL — ABNORMAL HIGH (ref 150–400)
RBC: 4.77 MIL/uL (ref 3.87–5.11)
RDW: 12 % (ref 11.5–15.5)
WBC: 6 10*3/uL (ref 4.0–10.5)
nRBC: 0 % (ref 0.0–0.2)

## 2019-04-26 LAB — MAGNESIUM: Magnesium: 2.2 mg/dL (ref 1.7–2.4)

## 2019-04-26 LAB — CULTURE, BLOOD (ROUTINE X 2)
Culture: NO GROWTH
Special Requests: ADEQUATE

## 2019-04-26 LAB — GLUCOSE, CAPILLARY
Glucose-Capillary: 106 mg/dL — ABNORMAL HIGH (ref 70–99)
Glucose-Capillary: 111 mg/dL — ABNORMAL HIGH (ref 70–99)
Glucose-Capillary: 120 mg/dL — ABNORMAL HIGH (ref 70–99)
Glucose-Capillary: 124 mg/dL — ABNORMAL HIGH (ref 70–99)
Glucose-Capillary: 125 mg/dL — ABNORMAL HIGH (ref 70–99)
Glucose-Capillary: 127 mg/dL — ABNORMAL HIGH (ref 70–99)

## 2019-04-26 NOTE — Progress Notes (Signed)
PROGRESS NOTE  Amy Casey TWS:[id number] DOB: Sep 19, 1960   PCP: Patient, No Pcp Per  Patient is from: Home.  DOA: 04/15/2019 LOS: 10  Brief Narrative / Interim history: 59 year old female with history of HTN, depression and schizophrenia sent to ER from [facility] for elevated blood pressure.  Reportedly has been of care site medications for sometimes and taken to [facility] by family members.  Noted to have elevated blood pressure and sent to the ED.  In ED, she was noted to have elevated blood pressure, hypokalemia and AKI for which she was admitted.  IVC paperwork was completed and psychiatry was consulted.   Psychiatry made adjustments to his psych medications and recommended inpatient psych when medically stable.  Patient continued to refuse p.o. intake, care and medications. She also developed AKI and sepsis likely from UTI.  NG tube placed and tube feedings started on 04/23/2019.  Patient had positive RPR with low titer.  T. pallidum antibody positive.  Pharmacy called health department, and there was no documentation of previous treatment for syphilis.  Infectious disease consulted and recommended Bicillin injection x1.  Subjective: No major events overnight or this morning.  Reports mild left abdominal pain but couldn't localize well.  She is more interactive and engaging today.  She denies chest pain, dyspnea, nausea, vomiting or UTI symptoms.  Objective: Vitals:   04/25/19 1500 04/25/19 2042 04/26/19 0420 04/26/19 1441  BP: 117/65 122/75 132/68 127/68  Pulse: 66 65 61 67  Resp:  16 18 19   Temp: 98.1 F (36.7 C) 97.7 F (36.5 C) 98.3 F (36.8 C) 98.2 F (36.8 C)  TempSrc: Oral Oral Oral Oral  SpO2:  99% 98% 91%  Weight:        Intake/Output Summary (Last 24 hours) at 04/26/2019 1649 Last data filed at 04/25/2019 1700 Gross per 24 hour  Intake 0 ml  Output -  Net 0 ml   Filed Weights   04/18/19 0500 04/19/19 0523  Weight: 76.3 kg 76.8 kg    Examination: GENERAL:  No acute distress.  Appears well.  HEENT: MMM.  Vision and hearing grossly intact.  NECK: Supple.  No apparent JVD.  RESP:  No IWOB. Good air movement bilaterally. CVS:  RRR. Heart sounds normal.  ABD/GI/GU: Bowel sounds present. Soft. Non tender.  MSK/EXT:  Moves extremities. No apparent deformity. No edema.  SKIN: no apparent skin lesion or wound NEURO: Awake, alert and oriented x4 except to date.  No apparent focal neuro deficit. PSYCH: Calm. Normal affect.  Procedures:  None  Assessment & Plan: Sepsis likely due to UTI: patient had a UA concerning for UTI on admission but did not have signs and symptoms of infection until 1/24 when she spiked fever and became tachycardic.  Repeat UA concerning for UTI.  Urine culture with multiple species.  Blood cultures negative so far.  Repeat urine culture marked sent in computer but micro lab has not received the specimen.  -Continue empiric IV ceftriaxone 1/24> for a total of 5 days  Schizophrenia/major depressive disorder: continues to refuse p.o. intake.  Psychiatry recommended 'medication over objection' which is appropriate.  She is already under involuntary commitment.  NG tube placed and tube feed initiated on 1/26. -Continue Zyprexa 10 mg BID and Prozac 20 mg daily -P.o. or IM Haldol as needed -Optimize K and magnesium.  Intermittent EKG. -2/26. -Psychiatry following.  AKI/azotemia: baseline Cr 0.7-0.8 >> 1.35 (admit) > 0.80.  Likely prerenal in the setting of poor p.o. intake.  -Continue  tube feed until she takes p.o.  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  HIV negative.  RPR was negative about 8 months ago. -Infectious disease consulted-Bicillin 2.4 MU x1 on 1/27,  and serum VDRL  Essential hypertension: Normotensive. -Continue amlodipine 10 mg daily, metoprolol 25 mg twice daily and as needed hydralazine. -Given history of angioedema with ACEI and noncompliance   Hypernatremia: Resolved.  Hypokalemia: Resolved.  Hypomagnesemia: Resolved.  Nutrition/refusal of p.o. intake -Continue encouraging -Cortrak and tube feed started 1/26. -Multivitamin -Appreciate dietitian input    Nutrition Problem: Inadequate oral intake Etiology: lethargy/confusion  Signs/Symptoms: meal completion < 25%  Interventions: Tube feeding, Ensure Enlive (each supplement provides 350kcal and 20 grams of protein)   DVT prophylaxis: Subcu Lovenox Code Status: Full code Family Communication: Patient and Charity fundraiser.  Available if any question. Disposition Plan: Patient is now medically stable but requiring NG tube for feeding and medications administration.  Psychiatry recommended inpatient psych. Consultants: Psychiatry, ID (off)   Microbiology summarized: COVID-19 negative. Influenza PCR negative. Blood culture negative Urine culture multiple species Repeat urine culture was not sent  Sch Meds:  Scheduled Meds: . amLODipine  10 mg Per Tube Daily  . enoxaparin (LOVENOX) injection  40 mg Subcutaneous Q24H  . FLUoxetine  20 mg Per Tube Daily  . metoprolol tartrate  25 mg Per Tube BID  . multivitamin  15 mL Per Tube Daily  . OLANZapine  10 mg Per Tube BID   Continuous Infusions: . feeding supplement (OSMOLITE 1.5 CAL) 1,000 mL (04/25/19 2242)   PRN Meds:.acetaminophen **OR** acetaminophen, dextrose, metoprolol tartrate  Antimicrobials: Anti-infectives (From admission, onward)   Start     Dose/Rate Route Frequency Ordered Stop   04/24/19 1200  penicillin g benzathine (BICILLIN LA) 1200000 UNIT/2ML injection 2.4 Million Units     2.4 Million Units Intramuscular  Once 04/24/19 1121 04/24/19 1231   04/21/19 1215  cefTRIAXone (ROCEPHIN) 1 g in sodium chloride 0.9 % 100 mL IVPB  Status:  Discontinued     1 g 200 mL/hr over 30 Minutes Intravenous Every 24 hours 04/21/19 1209 04/24/19 0948       I have personally reviewed the following labs and images: CBC: Recent  Labs  Lab 04/22/19 0352 04/23/19 0427 04/24/19 0516 04/25/19 0455 04/26/19 0416  WBC 14.1* 8.8 6.9 5.5 6.0  NEUTROABS 11.3*  --   --   --   --   HGB 14.3 13.6 13.7 14.1 13.8  HCT 44.2 43.8 43.1 44.3 43.7  MCV 90.0 92.6 92.3 90.4 91.6  PLT 286 279 332 389 403*   BMP &GFR Recent Labs  Lab 04/22/19 0352 04/22/19 0352 04/23/19 0427 04/23/19 0427 04/23/19 1540 04/24/19 0516 04/24/19 1642 04/25/19 0455 04/26/19 0416  NA 140  --  142  --   --  140  --  144 143  K 4.6  --  4.3  --   --  3.9  --  3.6 4.3  CL 104  --  107  --   --  108  --  103 104  CO2 23  --  25  --   --  22  --  27 26  GLUCOSE 126*  --  123*  --   --  133*  --  127* 117*  BUN 27*  --  23*  --   --  18  --  19 22*  CREATININE 1.34*  --  1.01*  --   --  0.77  --  0.80 0.82  CALCIUM 8.8*  --  8.7*  --   --  8.8*  --  9.2 9.2  MG 2.5*   < > 2.4   < > 2.3 2.3 2.1 2.2 2.2  PHOS 3.4   < > 3.2   < > 2.9 3.8 3.5 4.1 4.4   < > = values in this interval not displayed.   Estimated Creatinine Clearance: 78.9 mL/min (by C-G formula based on SCr of 0.82 mg/dL). Liver & Pancreas: Recent Labs  Lab 04/20/19 2349 04/20/19 2349 04/21/19 0412 04/22/19 0352 04/23/19 0427 04/24/19 0516 04/26/19 0416  AST 15  --   --   --   --   --   --   ALT 16  --   --   --   --   --   --   ALKPHOS 62  --   --   --   --   --   --   BILITOT 0.5  --   --   --   --   --   --   PROT 6.7  --   --   --   --   --   --   ALBUMIN 3.4*   < > 3.5 3.1* 2.8* 2.8* 2.9*   < > = values in this interval not displayed.   No results for input(s): LIPASE, AMYLASE in the last 168 hours. Recent Labs  Lab 04/20/19 0654  AMMONIA 36*   Diabetic: No results for input(s): HGBA1C in the last 72 hours. Recent Labs  Lab 04/26/19 0050 04/26/19 0416 04/26/19 0816 04/26/19 1121 04/26/19 1602  GLUCAP 106* 124* 111* 125* 127*   Cardiac Enzymes: Recent Labs  Lab 04/21/19 1812  CKTOTAL 77   No results for input(s): PROBNP in the last 8760 hours.  Coagulation Profile: No results for input(s): INR, PROTIME in the last 168 hours. Thyroid Function Tests: No results for input(s): TSH, T4TOTAL, FREET4, T3FREE, THYROIDAB in the last 72 hours. Lipid Profile: No results for input(s): CHOL, HDL, LDLCALC, TRIG, CHOLHDL, LDLDIRECT in the last 72 hours. Anemia Panel: No results for input(s): VITAMINB12, FOLATE, FERRITIN, TIBC, IRON, RETICCTPCT in the last 72 hours. Urine analysis:    Component Value Date/Time   COLORURINE AMBER (A) 04/21/2019 1254   APPEARANCEUR TURBID (A) 04/21/2019 1254   LABSPEC 1.014 04/21/2019 1254   LABSPEC 1.015 08/21/2018 1154   PHURINE 5.0 04/21/2019 1254   GLUCOSEU NEGATIVE 04/21/2019 1254   HGBUR LARGE (A) 04/21/2019 1254   BILIRUBINUR NEGATIVE 04/21/2019 1254   BILIRUBINUR negative 08/21/2018 1154   BILIRUBINUR n 06/02/2016 1251   KETONESUR 5 (A) 04/21/2019 1254   PROTEINUR 100 (A) 04/21/2019 1254   UROBILINOGEN negative 06/02/2016 1251   UROBILINOGEN 1.0 02/19/2014 1849   NITRITE NEGATIVE 04/21/2019 1254   LEUKOCYTESUR LARGE (A) 04/21/2019 1254   Sepsis Labs: Invalid input(s): PROCALCITONIN, LACTICIDVEN  Microbiology: Recent Results (from the past 240 hour(s))  Culture, Urine     Status: Abnormal   Collection Time: 04/21/19 12:54 PM   Specimen: Urine, Random  Result Value Ref Range Status   Specimen Description URINE, RANDOM  Final   Special Requests   Final    NONE Performed at [facility], [address], [place], [place] [address]    Culture MULTIPLE SPECIES PRESENT, SUGGEST RECOLLECTION (A)  Final   Report Status 04/22/2019 FINAL  Final  Culture, blood (routine x 2)     Status: None  Collection Time: 04/21/19  6:11 PM   Specimen: BLOOD RIGHT HAND  Result Value Ref Range Status   Specimen Description BLOOD RIGHT HAND  Final   Special Requests   Final    BOTTLES DRAWN AEROBIC AND ANAEROBIC Blood Culture adequate volume   Culture   Final    NO GROWTH 5 DAYS Performed at [facility], [address] [place], [place] [address]    Report Status 04/26/2019 FINAL  Final  Culture, blood (routine x 2)     Status: None (Preliminary result)   Collection Time: 04/21/19  9:30 PM   Specimen: BLOOD RIGHT HAND  Result Value Ref Range Status   Specimen Description BLOOD RIGHT HAND  Final   Special Requests   Final    BOTTLES DRAWN AEROBIC AND ANAEROBIC Blood Culture adequate volume   Culture   Final    NO GROWTH 4 DAYS Performed at [facility], [place] [address], [place], [place] [address]    Report Status PENDING  Incomplete  Culture, Urine     Status: Abnormal   Collection Time: 04/22/19  3:01 PM   Specimen: Urine, Clean Catch  Result Value Ref Range Status   Specimen Description URINE, CLEAN CATCH  Final   Special Requests Normal  Final   Culture (A)  Final    <10,000 COLONIES/mL INSIGNIFICANT GROWTH Performed at [facility], [place] [address] [place], [place] [address]    Report Status 04/26/2019 FINAL  Final    Radiology Studies: No results found.    Tamyah Cutbirth T. [place]  If 7PM-7AM, please contact night-coverage [email or web address] Password [facility] 04/26/2019, 4:49 PM

## 2019-04-27 LAB — GLUCOSE, CAPILLARY
Glucose-Capillary: 103 mg/dL — ABNORMAL HIGH (ref 70–99)
Glucose-Capillary: 103 mg/dL — ABNORMAL HIGH (ref 70–99)
Glucose-Capillary: 106 mg/dL — ABNORMAL HIGH (ref 70–99)
Glucose-Capillary: 120 mg/dL — ABNORMAL HIGH (ref 70–99)
Glucose-Capillary: 127 mg/dL — ABNORMAL HIGH (ref 70–99)
Glucose-Capillary: 132 mg/dL — ABNORMAL HIGH (ref 70–99)

## 2019-04-27 LAB — RENAL FUNCTION PANEL
Albumin: 2.7 g/dL — ABNORMAL LOW (ref 3.5–5.0)
Anion gap: 10 (ref 5–15)
BUN: 24 mg/dL — ABNORMAL HIGH (ref 6–20)
CO2: 29 mmol/L (ref 22–32)
Calcium: 9.1 mg/dL (ref 8.9–10.3)
Chloride: 103 mmol/L (ref 98–111)
Creatinine, Ser: 0.99 mg/dL (ref 0.44–1.00)
GFR calc Af Amer: >60 mL/min (ref >60)
GFR calc non Af Amer: >60 mL/min (ref >60)
Glucose, Bld: 110 mg/dL — ABNORMAL HIGH (ref 70–99)
Phosphorus: 4.8 mg/dL — ABNORMAL HIGH (ref 2.5–4.6)
Potassium: 4 mmol/L (ref 3.5–5.1)
Sodium: 142 mmol/L (ref 135–145)

## 2019-04-27 LAB — CBC
HCT: 38.7 % (ref 36.0–46.0)
Hemoglobin: 12.1 g/dL (ref 12.0–15.0)
MCH: 28.6 pg (ref 26.0–34.0)
MCHC: 31.3 g/dL (ref 30.0–36.0)
MCV: 91.5 fL (ref 80.0–100.0)
Platelets: 424 10*3/uL — ABNORMAL HIGH (ref 150–400)
RBC: 4.23 MIL/uL (ref 3.87–5.11)
RDW: 12.1 % (ref 11.5–15.5)
WBC: 6.5 10*3/uL (ref 4.0–10.5)
nRBC: 0 % (ref 0.0–0.2)

## 2019-04-27 LAB — CULTURE, BLOOD (ROUTINE X 2)
Culture: NO GROWTH
Special Requests: ADEQUATE

## 2019-04-27 LAB — MAGNESIUM: Magnesium: 2.1 mg/dL (ref 1.7–2.4)

## 2019-04-27 NOTE — Progress Notes (Signed)
PROGRESS NOTE  Teri Legacy JKK:[id number] DOB: 1961/01/03   PCP: Patient, No Pcp Per  Patient is from: Home.  DOA: 04/15/2019 LOS: 11  Brief Narrative / Interim history: 59 year old female with history of HTN, depression and schizophrenia sent to ER from [facility] for elevated blood pressure.  Reportedly has been of care site medications for sometimes and taken to [facility] by family members.  Noted to have elevated blood pressure and sent to the ED.  In ED, she was noted to have elevated blood pressure, hypokalemia and AKI for which she was admitted.  IVC paperwork was completed and psychiatry was consulted.   Psychiatry made adjustments to his psych medications and recommended inpatient psych when medically stable.  Patient continued to refuse p.o. intake, care and medications. She also developed AKI and sepsis likely from UTI.  NG tube placed and tube feedings started on 04/23/2019.  Patient had positive RPR with low titer.  T. pallidum antibody positive.  Pharmacy called health department, and there was no documentation of previous treatment for syphilis.  Infectious disease consulted and recommended Bicillin injection x1.  Subjective: No major events overnight or this morning.  More interactive and engaged.  She asked me if I have talked to her a psychiatrist at [facility] about her medications.  She likes to go home but doesn't have good insight into why she is here.  She continues to refuse p.o. intake.  Reportedly, had about 25% of her supper last night with husband present.  Objective: Vitals:   04/26/19 2032 04/27/19 0046 04/27/19 0344 04/27/19 0755  BP: 119/79 107/68 (!) 113/59 117/81  Pulse: 66 60 62 75  Resp: 19 18 16 17   Temp: 98.2 F (36.8 C) 98.4 F (36.9 C) 98 F (36.7 C)   TempSrc: Oral Oral Oral Oral  SpO2: 93% 97% 98% 98%  Weight:   77.1 kg     Intake/Output Summary (Last 24 hours) at 04/27/2019 1111 Last data filed at 04/27/2019 0939 Gross per 24 hour  Intake 120  ml  Output --  Net 120 ml   Filed Weights   04/18/19 0500 04/19/19 0523 04/27/19 0344  Weight: 76.3 kg 76.8 kg 77.1 kg    Examination:  GENERAL: No acute distress.  Appears well.  HEENT: MMM.  Vision and hearing grossly intact. TF via NGT NECK: Supple.  No apparent JVD.  RESP:  No IWOB. Good air movement bilaterally. CVS:  RRR. Heart sounds normal.  ABD/GI/GU: Bowel sounds present. Soft. Non tender.  MSK/EXT:  Moves extremities. No apparent deformity. No edema.  SKIN: no apparent skin lesion or wound NEURO: Awake, alert and oriented appropriately.  No apparent focal neuro deficit. PSYCH: Calm.  Flat affect.  Procedures:  None  Assessment & Plan: Schizophrenia/major depressive disorder: continues to refuse p.o. intake.  Psychiatry recommended 'medication over objection' which is appropriate.  She is already under involuntary commitment.  NG tube placed and tube feed initiated on 1/26. -Continue Zyprexa 10 mg BID and Prozac 20 mg daily -P.o. or IM Haldol as needed -Optimize K and magnesium.  Intermittent EKG. -2/26. -Psychiatry following.  Sepsis likely due to UTI: patient had a UA concerning for UTI on admission but did not have signs and symptoms of infection until 1/24 when she spiked fever and became tachycardic.  Repeat UA concerning for UTI.  Urine culture with multiple species.  Blood cultures negative so far.  Repeat urine culture was not sent. 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  AKI/azotemia: baseline Cr 0.7-0.8 >> 1.35 (admit) > 0.80.  Likely prerenal in the setting of poor p.o. intake.  -Continue tube feed until she takes p.o.  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  HIV negative.  RPR was negative about 8 months ago. -Infectious disease consulted-Bicillin 2.4 MU x1 on 1/27,  and serum VDRL  Essential hypertension:  Normotensive. -Continue amlodipine 10 mg daily, metoprolol 25 mg twice daily and as needed hydralazine. -Given history of angioedema with ACEI and noncompliance  Hypernatremia: Resolved.  Hypokalemia: Resolved.  Hypomagnesemia: Resolved.  Nutrition/refusal of p.o. intake -Continue encouraging  -Cortrak and tube feed started 1/26 -Continue multivitamin -Appreciate dietitian input    Nutrition Problem: Inadequate oral intake Etiology: lethargy/confusion  Signs/Symptoms: meal completion < 25%  Interventions: Tube feeding, Ensure Enlive (each supplement provides 350kcal and 20 grams of protein)   DVT prophylaxis: Subcu Lovenox Code Status: Full code Family Communication: Patient and Charity fundraiser.  Available if any question. Disposition Plan: Patient is now medically stable but requiring NG tube for feeding and medications administration.  Psychiatry recommended inpatient psych. Consultants: Psychiatry, ID (off)   Microbiology summarized: COVID-19 negative. Influenza PCR negative. Blood culture negative Urine culture multiple species Repeat urine culture was not sent  Sch Meds:  Scheduled Meds: . amLODipine  10 mg Per Tube Daily  . enoxaparin (LOVENOX) injection  40 mg Subcutaneous Q24H  . FLUoxetine  20 mg Per Tube Daily  . metoprolol tartrate  25 mg Per Tube BID  . multivitamin  15 mL Per Tube Daily  . OLANZapine  10 mg Per Tube BID   Continuous Infusions: . feeding supplement (OSMOLITE 1.5 CAL) 1,000 mL (04/26/19 2139)   PRN Meds:.acetaminophen **OR** acetaminophen, dextrose, metoprolol tartrate  Antimicrobials: Anti-infectives (From admission, onward)   Start     Dose/Rate Route Frequency Ordered Stop   04/24/19 1200  penicillin g benzathine (BICILLIN LA) 1200000 UNIT/2ML injection 2.4 Million Units     2.4 Million Units Intramuscular  Once 04/24/19 1121 04/24/19 1231   04/21/19 1215  cefTRIAXone (ROCEPHIN) 1 g in sodium chloride 0.9 % 100 mL IVPB  Status:  Discontinued      1 g 200 mL/hr over 30 Minutes Intravenous Every 24 hours 04/21/19 1209 04/24/19 0948       I have personally reviewed the following labs and images: CBC: Recent Labs  Lab 04/22/19 0352 04/22/19 0352 04/23/19 0427 04/24/19 0516 04/25/19 0455 04/26/19 0416 04/27/19 0427  WBC 14.1*   < > 8.8 6.9 5.5 6.0 6.5  NEUTROABS 11.3*  --   --   --   --   --   --   HGB 14.3   < > 13.6 13.7 14.1 13.8 12.1  HCT 44.2   < > 43.8 43.1 44.3 43.7 38.7  MCV 90.0   < > 92.6 92.3 90.4 91.6 91.5  PLT 286   < > 279 332 389 403* 424*   < > = values in this interval not displayed.   BMP &GFR Recent Labs  Lab 04/23/19 0427 04/23/19 1540 04/24/19 0516 04/24/19 1642 04/25/19 0455 04/26/19 0416 04/27/19 0427  NA 142  --  140  --  144 143 142  K 4.3  --  3.9  --  3.6 4.3 4.0  CL 107  --  108  --  103 104 103  CO2 25  --  22  --  27 26 29   GLUCOSE 123*  --  133*  --  127* 117* 110*  BUN 23*  --  18  --  19 22* 24*  CREATININE 1.01*  --  0.77  --  0.80 0.82 0.99  CALCIUM 8.7*  --  8.8*  --  9.2 9.2 9.1  MG 2.4   < > 2.3 2.1 2.2 2.2 2.1  PHOS 3.2   < > 3.8 3.5 4.1 4.4 4.8*   < > = values in this interval not displayed.   Estimated Creatinine Clearance: 65.3 mL/min (by C-G formula based on SCr of 0.99 mg/dL). Liver & Pancreas: Recent Labs  Lab 04/20/19 2349 04/21/19 0412 04/22/19 0352 04/23/19 0427 04/24/19 0516 04/26/19 0416 04/27/19 0427  AST 15  --   --   --   --   --   --   ALT 16  --   --   --   --   --   --   ALKPHOS 62  --   --   --   --   --   --   BILITOT 0.5  --   --   --   --   --   --   PROT 6.7  --   --   --   --   --   --   ALBUMIN 3.4*   < > 3.1* 2.8* 2.8* 2.9* 2.7*   < > = values in this interval not displayed.   No results for input(s): LIPASE, AMYLASE in the last 168 hours. No results for input(s): AMMONIA in the last 168 hours. Diabetic: No results for input(s): HGBA1C in the last 72 hours. Recent Labs  Lab 04/26/19 1602 04/26/19 2030 04/27/19 0037  04/27/19 0340 04/27/19 0750  GLUCAP 127* 120* 120* 106* 127*   Cardiac Enzymes: Recent Labs  Lab 04/21/19 1812  CKTOTAL 77   No results for input(s): PROBNP in the last 8760 hours. Coagulation Profile: No results for input(s): INR, PROTIME in the last 168 hours. Thyroid Function Tests: No results for input(s): TSH, T4TOTAL, FREET4, T3FREE, THYROIDAB in the last 72 hours. Lipid Profile: No results for input(s): CHOL, HDL, LDLCALC, TRIG, CHOLHDL, LDLDIRECT in the last 72 hours. Anemia Panel: No results for input(s): VITAMINB12, FOLATE, FERRITIN, TIBC, IRON, RETICCTPCT in the last 72 hours. Urine analysis:    Component Value Date/Time   COLORURINE AMBER (A) 04/21/2019 1254   APPEARANCEUR TURBID (A) 04/21/2019 1254   LABSPEC 1.014 04/21/2019 1254   LABSPEC 1.015 08/21/2018 1154   PHURINE 5.0 04/21/2019 1254   GLUCOSEU NEGATIVE 04/21/2019 1254   HGBUR LARGE (A) 04/21/2019 1254   BILIRUBINUR NEGATIVE 04/21/2019 1254   BILIRUBINUR negative 08/21/2018 1154   BILIRUBINUR n 06/02/2016 1251   KETONESUR 5 (A) 04/21/2019 1254   PROTEINUR 100 (A) 04/21/2019 1254   UROBILINOGEN negative 06/02/2016 1251   UROBILINOGEN 1.0 02/19/2014 1849   NITRITE NEGATIVE 04/21/2019 1254   LEUKOCYTESUR LARGE (A) 04/21/2019 1254   Sepsis Labs: Invalid input(s): PROCALCITONIN, [place]  Microbiology: Recent Results (from the past 240 hour(s))  Culture, Urine     Status: Abnormal   Collection Time: 04/21/19 12:54 PM   Specimen: Urine, Random  Result Value Ref Range Status   Specimen Description URINE, RANDOM  Final   Special Requests   Final    NONE Performed at [facility], [address] [place], [place] [address]    Culture MULTIPLE SPECIES PRESENT, SUGGEST RECOLLECTION (A)  Final   Report Status 04/22/2019 FINAL  Final  Culture, blood (  routine x 2)     Status: None   Collection Time: 04/21/19  6:11 PM   Specimen: BLOOD RIGHT HAND  Result Value Ref Range Status   Specimen  Description BLOOD RIGHT HAND  Final   Special Requests   Final    BOTTLES DRAWN AEROBIC AND ANAEROBIC Blood Culture adequate volume   Culture   Final    NO GROWTH 5 DAYS Performed at [facility], [address] [place], [place] [address]    Report Status 04/26/2019 FINAL  Final  Culture, blood (routine x 2)     Status: None (Preliminary result)   Collection Time: 04/21/19  9:30 PM   Specimen: BLOOD RIGHT HAND  Result Value Ref Range Status   Specimen Description BLOOD RIGHT HAND  Final   Special Requests   Final    BOTTLES DRAWN AEROBIC AND ANAEROBIC Blood Culture adequate volume   Culture   Final    NO GROWTH 4 DAYS Performed at [facility], [address], [place], [place] [address]    Report Status PENDING  Incomplete  Culture, Urine     Status: Abnormal   Collection Time: 04/22/19  3:01 PM   Specimen: Urine, Clean Catch  Result Value Ref Range Status   Specimen Description URINE, CLEAN CATCH  Final   Special Requests Normal  Final   Culture (A)  Final    <10,000 COLONIES/mL INSIGNIFICANT GROWTH Performed at [facility], [address], [place], [place] [address]    Report Status 04/26/2019 FINAL  Final    Radiology Studies: No results found.    Dodi Leu T. Anissia Wessells [facility]  If 7PM-7AM, please contact night-coverage [email or web address] Password [email or web address] 04/27/2019, 11:11 AM

## 2019-04-28 LAB — GLUCOSE, CAPILLARY
Glucose-Capillary: 105 mg/dL — ABNORMAL HIGH (ref 70–99)
Glucose-Capillary: 109 mg/dL — ABNORMAL HIGH (ref 70–99)
Glucose-Capillary: 111 mg/dL — ABNORMAL HIGH (ref 70–99)
Glucose-Capillary: 115 mg/dL — ABNORMAL HIGH (ref 70–99)
Glucose-Capillary: 116 mg/dL — ABNORMAL HIGH (ref 70–99)
Glucose-Capillary: 119 mg/dL — ABNORMAL HIGH (ref 70–99)

## 2019-04-28 LAB — RENAL FUNCTION PANEL
Albumin: 2.8 g/dL — ABNORMAL LOW (ref 3.5–5.0)
Anion gap: 13 (ref 5–15)
BUN: 29 mg/dL — ABNORMAL HIGH (ref 6–20)
CO2: 26 mmol/L (ref 22–32)
Calcium: 9.2 mg/dL (ref 8.9–10.3)
Chloride: 102 mmol/L (ref 98–111)
Creatinine, Ser: 0.94 mg/dL (ref 0.44–1.00)
GFR calc Af Amer: >60 mL/min (ref >60)
GFR calc non Af Amer: >60 mL/min (ref >60)
Glucose, Bld: 125 mg/dL — ABNORMAL HIGH (ref 70–99)
Phosphorus: 5 mg/dL — ABNORMAL HIGH (ref 2.5–4.6)
Potassium: 4.3 mmol/L (ref 3.5–5.1)
Sodium: 141 mmol/L (ref 135–145)

## 2019-04-28 LAB — MAGNESIUM: Magnesium: 2.1 mg/dL (ref 1.7–2.4)

## 2019-04-28 NOTE — Progress Notes (Signed)
PROGRESS NOTE  Amy Casey VOJ:[id number] DOB: 1960/05/30   PCP: Patient, No Pcp Per  Patient is from: Home.  DOA: 04/15/2019 LOS: 12  Brief Narrative / Interim history: 59 year old female with history of HTN, depression and schizophrenia sent to ER from [facility] for elevated blood pressure.  Reportedly has been of care site medications for sometimes and taken to [facility] by family members.  Noted to have elevated blood pressure and sent to the ED.  In ED, she was noted to have elevated blood pressure, hypokalemia and AKI for which she was admitted.  IVC paperwork was completed and psychiatry was consulted.   Psychiatry made adjustments to his psych medications and recommended inpatient psych when medically stable.  Patient continued to refuse p.o. intake, care and medications. She also developed AKI and sepsis likely from UTI.  NG tube placed and tube feedings started on 04/23/2019.  Patient had positive RPR with low titer.  T. pallidum antibody positive.  Pharmacy called health department, and there was no documentation of previous treatment for syphilis.  Infectious disease consulted and recommended Bicillin injection x1.  Assessment & Plan: Schizophrenia/major depressive disorder: Patient demands to remove her NG tube and she claims that she is eating well however she had breakfast on her table and when I checked, there was barely less than 10% finished.  I did encourage her to eat more so we can take the NG tube out and discharge her to inpatient psych.  NG tube placed and tube feed initiated on 1/26. -Continue Zyprexa 10 mg BID and Prozac 20 mg daily -P.o. or IM Haldol as needed -Optimize K and magnesium.  Intermittent EKG. -Chief Technology Officer. -Psychiatry following.  Sepsis likely due to UTI: patient had a UA concerning for UTI on admission but did not have signs and symptoms of infection until 1/24 when she spiked fever and became tachycardic.  Repeat UA concerning for UTI.   Urine culture with multiple species.  Blood cultures negative so far.  Repeat urine culture was not sent. 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  AKI/azotemia: baseline Cr 0.7-0.8 >> 1.35 (admit) > 0.80.  Likely prerenal in the setting of poor p.o. intake.  -Continue tube feed until she takes p.o.  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  HIV negative.  RPR was negative about 8 months ago. -Infectious disease consulted-Bicillin 2.4 MU x1 on 1/27,  and serum VDRL  Essential hypertension: Normotensive. -Continue amlodipine 10 mg daily, metoprolol 25 mg twice daily and as needed hydralazine. -Given history of angioedema with ACEI and noncompliance  Hypernatremia: Resolved.  Hypokalemia: Resolved.  Hypomagnesemia: Resolved.  Nutrition/refusal of p.o. intake -Continue encouraging  -Cortrak and tube feed started 1/26 -Continue multivitamin -Appreciate dietitian input   Subjective: Patient seen and examined.  Sitter at the bedside.  She has no complaints however she demands that her NG tube should be removed.  Objective: Vitals:   04/28/19 0006 04/28/19 0443 04/28/19 0446 04/28/19 0732  BP: 128/62  126/76 (!) 158/83  Pulse: 66  61 76  Resp: 15  14 14   Temp: 98.4 F (36.9 C)  97.8 F (36.6 C) 97.8 F (36.6 C)  TempSrc: Oral  Oral Oral  SpO2: 98%  97% 98%  Weight:  75.9 kg      Intake/Output Summary (Last 24 hours) at 04/28/2019 1220 Last data filed at 04/28/2019 0917 Gross per 24 hour  Intake 840 ml  Output 500 ml  Net 340 ml   Filed Weights   04/19/19 0523 04/27/19 0344 04/28/19 0443  Weight: 76.8 kg 77.1 kg 75.9 kg    Examination  General exam: Appears calm and comfortable  Respiratory system: Clear to auscultation. Respiratory effort normal. Cardiovascular system: S1 & S2 heard, RRR. No JVD, murmurs, rubs, gallops or clicks. No pedal  edema. Gastrointestinal system: Abdomen is nondistended, soft and nontender. No organomegaly or masses felt. Normal bowel sounds heard. Central nervous system: Alert and oriented. No focal neurological deficits. Extremities: Symmetric 5 x 5 power. Skin: No rashes, lesions or ulcers.  Psychiatry: Judgement and insight appear poor. Mood & affect flat.  Procedures:  None     Nutrition Problem: Inadequate oral intake Etiology: lethargy/confusion  Signs/Symptoms: meal completion < 25%  Interventions: Tube feeding, Ensure Enlive (each supplement provides 350kcal and 20 grams of protein)   DVT prophylaxis: Subcu Lovenox Code Status: Full code Family Communication: No family present at bedside. Disposition Plan: Patient is now medically stable but requiring NG tube for feeding and medications administration.  Psychiatry recommended inpatient psych but psychiatry does not take patients who require NG tube so she will be discharged once she takes enough p.o. Consultants: Psychiatry, ID (off)   Microbiology summarized: COVID-19 negative. Influenza PCR negative. Blood culture negative Urine culture multiple species Repeat urine culture was not sent  Sch Meds:  Scheduled Meds: . amLODipine  10 mg Per Tube Daily  . enoxaparin (LOVENOX) injection  40 mg Subcutaneous Q24H  . FLUoxetine  20 mg Per Tube Daily  . metoprolol tartrate  25 mg Per Tube BID  . multivitamin  15 mL Per Tube Daily  . OLANZapine  10 mg Per Tube BID   Continuous Infusions: . feeding supplement (OSMOLITE 1.5 CAL) 1,000 mL (04/26/19 2139)   PRN Meds:.acetaminophen **OR** acetaminophen, dextrose, metoprolol tartrate  Antimicrobials: Anti-infectives (From admission, onward)   Start     Dose/Rate Route Frequency Ordered Stop   04/24/19 1200  penicillin g benzathine (BICILLIN LA) 1200000 UNIT/2ML injection 2.4 Million Units     2.4 Million Units Intramuscular  Once 04/24/19 1121 04/24/19 1231   04/21/19 1215   cefTRIAXone (ROCEPHIN) 1 g in sodium chloride 0.9 % 100 mL IVPB  Status:  Discontinued     1 g 200 mL/hr over 30 Minutes Intravenous Every 24 hours 04/21/19 1209 04/24/19 0948       I have personally reviewed the following labs and images: CBC: Recent Labs  Lab 04/22/19 0352 04/22/19 0352 04/23/19 0427 04/24/19 0516 04/25/19 0455 04/26/19 0416 04/27/19 0427  WBC 14.1*   < > 8.8 6.9 5.5 6.0 6.5  NEUTROABS 11.3*  --   --   --   --   --   --   HGB 14.3   < > 13.6 13.7 14.1 13.8 12.1  HCT 44.2   < > 43.8 43.1 44.3 43.7 38.7  MCV 90.0   < > 92.6 92.3 90.4 91.6 91.5  PLT 286   < > 279 332 389 403* 424*   < > = values in this interval not displayed.   BMP &GFR Recent Labs  Lab 04/24/19 0516 04/24/19 0516 04/24/19 1642 04/25/19 0455 04/26/19 0416 04/27/19 0427 04/28/19 0313  NA 140  --   --  144 143 142 141  K 3.9  --   --  3.6 4.3 4.0 4.3  CL 108  --   --  103 104 103 102  CO2 22  --   --  27 26 29 26   GLUCOSE 133*  --   --  127* 117* 110* 125*  BUN 18  --   --  19 22* 24* 29*  CREATININE 0.77  --   --  0.80 0.82 0.99 0.94  CALCIUM 8.8*  --   --  9.2 9.2 9.1 9.2  MG 2.3   < > 2.1 2.2 2.2 2.1 2.1  PHOS 3.8   < > 3.5 4.1 4.4 4.8* 5.0*   < > = values in this interval not displayed.   Estimated Creatinine Clearance: 68.8 mL/min (by C-G formula based on SCr of 0.94 mg/dL). Liver & Pancreas: Recent Labs  Lab 04/23/19 0427 04/24/19 0516 04/26/19 0416 04/27/19 0427 04/28/19 0313  ALBUMIN 2.8* 2.8* 2.9* 2.7* 2.8*   No results for input(s): LIPASE, AMYLASE in the last 168 hours. No results for input(s): AMMONIA in the last 168 hours. Diabetic: No results for input(s): HGBA1C in the last 72 hours. Recent Labs  Lab 04/27/19 1117 04/27/19 1607 04/27/19 2006 04/28/19 0004 04/28/19 0500  GLUCAP 103* 132* 103* 105* 115*   Cardiac Enzymes: Recent Labs  Lab 04/21/19 1812  CKTOTAL 77   No results for input(s): PROBNP in the last 8760 hours. Coagulation  Profile: No results for input(s): INR, PROTIME in the last 168 hours. Thyroid Function Tests: No results for input(s): TSH, T4TOTAL, FREET4, T3FREE, THYROIDAB in the last 72 hours. Lipid Profile: No results for input(s): CHOL, HDL, LDLCALC, TRIG, CHOLHDL, LDLDIRECT in the last 72 hours. Anemia Panel: No results for input(s): VITAMINB12, FOLATE, FERRITIN, TIBC, IRON, RETICCTPCT in the last 72 hours. Urine analysis:    Component Value Date/Time   COLORURINE AMBER (A) 04/21/2019 1254   APPEARANCEUR TURBID (A) 04/21/2019 1254   LABSPEC 1.014 04/21/2019 1254   LABSPEC 1.015 08/21/2018 1154   PHURINE 5.0 04/21/2019 1254   GLUCOSEU NEGATIVE 04/21/2019 1254   HGBUR LARGE (A) 04/21/2019 1254   BILIRUBINUR NEGATIVE 04/21/2019 1254   BILIRUBINUR negative 08/21/2018 1154   BILIRUBINUR n 06/02/2016 1251   KETONESUR 5 (A) 04/21/2019 1254   PROTEINUR 100 (A) 04/21/2019 1254   UROBILINOGEN negative 06/02/2016 1251   UROBILINOGEN 1.0 02/19/2014 1849   NITRITE NEGATIVE 04/21/2019 1254   LEUKOCYTESUR LARGE (A) 04/21/2019 1254   Sepsis Labs: Invalid input(s): PROCALCITONIN, LACTICIDVEN  Microbiology: Recent Results (from the past 240 hour(s))  Culture, Urine     Status: Abnormal   Collection Time: 04/21/19 12:54 PM   Specimen: Urine, Random  Result Value Ref Range Status   Specimen Description URINE, RANDOM  Final   Special Requests   Final    NONE Performed at [facility], [address], [place], [place]    Culture MULTIPLE SPECIES PRESENT, SUGGEST RECOLLECTION (A)  Final   Report Status 04/22/2019 FINAL  Final  Culture, blood (routine x 2)     Status: None   Collection Time: 04/21/19  6:11 PM   Specimen: BLOOD RIGHT HAND  Result Value Ref Range Status   Specimen Description BLOOD RIGHT HAND  Final   Special Requests   Final    BOTTLES DRAWN AEROBIC AND ANAEROBIC Blood Culture adequate volume   Culture   Final    NO GROWTH 5 DAYS Performed at [facility], [address], [place], [place]    Report Status 04/26/2019 FINAL  Final  Culture, blood (routine x 2)     Status: None   Collection Time: 04/21/19  9:30 PM  Specimen: BLOOD RIGHT HAND  Result Value Ref Range Status   Specimen Description BLOOD RIGHT HAND  Final   Special Requests   Final    BOTTLES DRAWN AEROBIC AND ANAEROBIC Blood Culture adequate volume   Culture   Final    NO GROWTH 5 DAYS Performed at [facility], [address] [place], [place] [address]    Report Status 04/27/2019 FINAL  Final  Culture, Urine     Status: Abnormal   Collection Time: 04/22/19  3:01 PM   Specimen: Urine, Clean Catch  Result Value Ref Range Status   Specimen Description URINE, CLEAN CATCH  Final   Special Requests Normal  Final   Culture (A)  Final    <10,000 COLONIES/mL INSIGNIFICANT GROWTH Performed at [facility], [address] [place], [place] [address]    Report Status 04/26/2019 FINAL  Final    Radiology Studies: No results found.   Hughie Closs, MD [facility]  If 7PM-7AM, please contact night-coverage [email or web address] 04/28/2019, 12:20 PM

## 2019-04-29 LAB — RENAL FUNCTION PANEL
Albumin: 3 g/dL — ABNORMAL LOW (ref 3.5–5.0)
Anion gap: 11 (ref 5–15)
BUN: 29 mg/dL — ABNORMAL HIGH (ref 6–20)
CO2: 28 mmol/L (ref 22–32)
Calcium: 9.4 mg/dL (ref 8.9–10.3)
Chloride: 102 mmol/L (ref 98–111)
Creatinine, Ser: 0.87 mg/dL (ref 0.44–1.00)
GFR calc Af Amer: >60 mL/min (ref >60)
GFR calc non Af Amer: >60 mL/min (ref >60)
Glucose, Bld: 111 mg/dL — ABNORMAL HIGH (ref 70–99)
Phosphorus: 5.2 mg/dL — ABNORMAL HIGH (ref 2.5–4.6)
Potassium: 4.1 mmol/L (ref 3.5–5.1)
Sodium: 141 mmol/L (ref 135–145)

## 2019-04-29 LAB — GLUCOSE, CAPILLARY
Glucose-Capillary: 105 mg/dL — ABNORMAL HIGH (ref 70–99)
Glucose-Capillary: 107 mg/dL — ABNORMAL HIGH (ref 70–99)
Glucose-Capillary: 108 mg/dL — ABNORMAL HIGH (ref 70–99)
Glucose-Capillary: 109 mg/dL — ABNORMAL HIGH (ref 70–99)
Glucose-Capillary: 113 mg/dL — ABNORMAL HIGH (ref 70–99)
Glucose-Capillary: 120 mg/dL — ABNORMAL HIGH (ref 70–99)

## 2019-04-29 LAB — MAGNESIUM: Magnesium: 2.2 mg/dL (ref 1.7–2.4)

## 2019-04-29 NOTE — Plan of Care (Signed)
  Problem: Clinical Measurements: Goal: Diagnostic test results will improve Outcome: Progressing   Problem: Coping: Goal: Level of anxiety will decrease Outcome: Progressing   

## 2019-04-29 NOTE — Progress Notes (Signed)
PROGRESS NOTE  Amy Casey JSH:[id number] DOB: 07-20-1960   PCP: Patient, No Pcp Per  Patient is from: Home.  DOA: 04/15/2019 LOS: 65  Brief Narrative / Interim history: 59 year old female with history of HTN, depression and schizophrenia sent to ER from [facility] for elevated blood pressure.  Reportedly has been of care site medications for sometimes and taken to [facility] by family members.  Noted to have elevated blood pressure and sent to the ED.  In ED, she was noted to have elevated blood pressure, hypokalemia and AKI for which she was admitted.  IVC paperwork was completed and psychiatry was consulted.   Psychiatry made adjustments to his psych medications and recommended inpatient psych when medically stable.  Patient continued to refuse p.o. intake, care and medications. She also developed AKI and sepsis likely from UTI.  NG tube placed and tube feedings started on 04/23/2019.  Patient had positive RPR with low titer.  T. pallidum antibody positive.  Pharmacy called health department, and there was no documentation of previous treatment for syphilis.  Infectious disease consulted and recommended Bicillin injection x1.  Assessment & Plan: Schizophrenia/major depressive disorder: Still not eating much. Patient once again demands to remove her NG tube and she claims that she is eating well however she had not eating any of her breakfast today. I did encourage her again today to eat more so we can take the NG tube out and discharge her to inpatient psych.  NG tube placed and tube feed initiated on 1/26. -Continue Zyprexa 10 mg BID and Prozac 20 mg daily -P.o. or IM Haldol as needed -Optimize K and magnesium.  Intermittent EKG. -Chief Technology Officer. -Psychiatry following.  Sepsis likely due to UTI: patient had a UA concerning for UTI on admission but did not have signs and symptoms of infection until 1/24 when she spiked fever and became tachycardic.  Repeat UA concerning for UTI.   Urine culture with multiple species.  Blood cultures negative so far.  Repeat urine culture was not sent. 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  AKI/azotemia: baseline Cr 0.7-0.8 >> 1.35 (admit) > 0.80.  Likely prerenal in the setting of poor p.o. intake.  -Continue tube feed until she takes p.o.  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  HIV negative.  RPR was negative about 8 months ago. -Infectious disease consulted-Bicillin 2.4 MU x1 on 1/27,  and serum VDRL  Essential hypertension: Normotensive. -Continue amlodipine 10 mg daily, metoprolol 25 mg twice daily and as needed hydralazine. -Given history of angioedema with ACEI and noncompliance  Hypernatremia: Resolved.  Hypokalemia: Resolved.  Hypomagnesemia: Resolved.  Nutrition/refusal of p.o. intake -Continue encouraging  -Cortrak and tube feed started 1/26 -Continue multivitamin -Appreciate dietitian input   Subjective: Seen and examined. She has no complaints. Sitter at the bedside.  Objective: Vitals:   04/28/19 2354 04/29/19 0500 04/29/19 0847 04/29/19 0849  BP: 126/84 112/74 121/71 121/71  Pulse: 61 83 70 82  Resp: 16 16 12    Temp: 98 F (36.7 C)     TempSrc: Oral     SpO2: 100% 100%    Weight:  75.8 kg      Intake/Output Summary (Last 24 hours) at 04/29/2019 1053 Last data filed at 04/29/2019 0900 Gross per 24 hour  Intake 250 ml  Output 700 ml  Net -450 ml   Filed Weights   04/27/19 0344 04/28/19 0443 04/29/19 0500  Weight:  77.1 kg 75.9 kg 75.8 kg    Examination  General exam: Appears calm and comfortable  Respiratory system: Clear to auscultation. Respiratory effort normal. Cardiovascular system: S1 & S2 heard, RRR. No JVD, murmurs, rubs, gallops or clicks. No pedal edema. Gastrointestinal system: Abdomen is nondistended, soft and nontender. No organomegaly or masses felt.  Normal bowel sounds heard. Central nervous system: Alert and oriented. No focal neurological deficits. Extremities: Symmetric 5 x 5 power. Skin: No rashes, lesions or ulcers.  Psychiatry: Judgement and insight appear poor. Mood & affect flat.   Procedures:  None     Nutrition Problem: Inadequate oral intake Etiology: lethargy/confusion  Signs/Symptoms: meal completion < 25%  Interventions: Tube feeding, Ensure Enlive (each supplement provides 350kcal and 20 grams of protein)   DVT prophylaxis: Subcu Lovenox Code Status: Full code Family Communication: No family present at bedside. Disposition Plan: Patient is now medically stable but requiring NG tube for feeding and medications administration.  Psychiatry recommended inpatient psych but psychiatry does not take patients who require NG tube so she will be discharged once she takes enough p.o. Consultants: Psychiatry, ID (off)   Microbiology summarized: COVID-19 negative. Influenza PCR negative. Blood culture negative Urine culture multiple species Repeat urine culture was not sent  Sch Meds:  Scheduled Meds: . amLODipine  10 mg Per Tube Daily  . enoxaparin (LOVENOX) injection  40 mg Subcutaneous Q24H  . FLUoxetine  20 mg Per Tube Daily  . metoprolol tartrate  25 mg Per Tube BID  . multivitamin  15 mL Per Tube Daily  . OLANZapine  10 mg Per Tube BID   Continuous Infusions: . feeding supplement (OSMOLITE 1.5 CAL) 1,000 mL (04/28/19 1537)   PRN Meds:.acetaminophen **OR** acetaminophen, dextrose, metoprolol tartrate  Antimicrobials: Anti-infectives (From admission, onward)   Start     Dose/Rate Route Frequency Ordered Stop   04/24/19 1200  penicillin g benzathine (BICILLIN LA) 1200000 UNIT/2ML injection 2.4 Million Units     2.4 Million Units Intramuscular  Once 04/24/19 1121 04/24/19 1231   04/21/19 1215  cefTRIAXone (ROCEPHIN) 1 g in sodium chloride 0.9 % 100 mL IVPB  Status:  Discontinued     1 g 200 mL/hr over 30  Minutes Intravenous Every 24 hours 04/21/19 1209 04/24/19 0948       I have personally reviewed the following labs and images: CBC: Recent Labs  Lab 04/23/19 0427 04/24/19 0516 04/25/19 0455 04/26/19 0416 04/27/19 0427  WBC 8.8 6.9 5.5 6.0 6.5  HGB 13.6 13.7 14.1 13.8 12.1  HCT 43.8 43.1 44.3 43.7 38.7  MCV 92.6 92.3 90.4 91.6 91.5  PLT 279 332 389 403* 424*   BMP &GFR Recent Labs  Lab 04/25/19 0455 04/26/19 0416 04/27/19 0427 04/28/19 0313 04/29/19 0431  NA 144 143 142 141 141  K 3.6 4.3 4.0 4.3 4.1  CL 103 104 103 102 102  CO2 27 26 29 26 28   GLUCOSE 127* 117* 110* 125* 111*  BUN 19 22* 24* 29* 29*  CREATININE 0.80 0.82 0.99 0.94 0.87  CALCIUM 9.2 9.2 9.1 9.2 9.4  MG 2.2 2.2 2.1 2.1 2.2  PHOS 4.1 4.4 4.8* 5.0* 5.2*   Estimated Creatinine Clearance: 74.4 mL/min (by C-G formula based on SCr of 0.87 mg/dL). Liver & Pancreas: Recent Labs  Lab 04/24/19 0516 04/26/19 0416 04/27/19 0427 04/28/19 0313 04/29/19 0431  ALBUMIN 2.8* 2.9* 2.7* 2.8* 3.0*   No results for input(s): LIPASE, AMYLASE in the last 168 hours. No results for input(s): AMMONIA in  the last 168 hours. Diabetic: No results for input(s): HGBA1C in the last 72 hours. Recent Labs  Lab 04/28/19 1624 04/28/19 1947 04/28/19 2359 04/29/19 0431 04/29/19 0750  GLUCAP 111* 119* 109* 107* 113*   Cardiac Enzymes: No results for input(s): CKTOTAL, CKMB, CKMBINDEX, TROPONINI in the last 168 hours. No results for input(s): PROBNP in the last 8760 hours. Coagulation Profile: No results for input(s): INR, PROTIME in the last 168 hours. Thyroid Function Tests: No results for input(s): TSH, T4TOTAL, FREET4, T3FREE, THYROIDAB in the last 72 hours. Lipid Profile: No results for input(s): CHOL, HDL, LDLCALC, TRIG, CHOLHDL, LDLDIRECT in the last 72 hours. Anemia Panel: No results for input(s): VITAMINB12, FOLATE, FERRITIN, TIBC, IRON, RETICCTPCT in the last 72 hours. Urine analysis:    Component Value  Date/Time   COLORURINE AMBER (A) 04/21/2019 1254   APPEARANCEUR TURBID (A) 04/21/2019 1254   LABSPEC 1.014 04/21/2019 1254   LABSPEC 1.015 08/21/2018 1154   PHURINE 5.0 04/21/2019 1254   GLUCOSEU NEGATIVE 04/21/2019 1254   HGBUR LARGE (A) 04/21/2019 1254   BILIRUBINUR NEGATIVE 04/21/2019 1254   BILIRUBINUR negative 08/21/2018 1154   BILIRUBINUR n 06/02/2016 1251   KETONESUR 5 (A) 04/21/2019 1254   PROTEINUR 100 (A) 04/21/2019 1254   UROBILINOGEN negative 06/02/2016 1251   UROBILINOGEN 1.0 02/19/2014 1849   NITRITE NEGATIVE 04/21/2019 1254   LEUKOCYTESUR LARGE (A) 04/21/2019 1254   Sepsis Labs: Invalid input(s): PROCALCITONIN, LACTICIDVEN  Microbiology: Recent Results (from the past 240 hour(s))  Culture, Urine     Status: Abnormal   Collection Time: 04/21/19 12:54 PM   Specimen: Urine, Random  Result Value Ref Range Status   Specimen Description URINE, RANDOM  Final   Special Requests   Final    NONE Performed at [facility], [address] [place], [place] [address]    Culture MULTIPLE SPECIES PRESENT, SUGGEST RECOLLECTION (A)  Final   Report Status 04/22/2019 FINAL  Final  Culture, blood (routine x 2)     Status: None   Collection Time: 04/21/19  6:11 PM   Specimen: BLOOD RIGHT HAND  Result Value Ref Range Status   Specimen Description BLOOD RIGHT HAND  Final   Special Requests   Final    BOTTLES DRAWN AEROBIC AND ANAEROBIC Blood Culture adequate volume   Culture   Final    NO GROWTH 5 DAYS Performed at [facility], [address], [place], [place] [address]    Report Status 04/26/2019 FINAL  Final  Culture, blood (routine x 2)     Status: None   Collection Time: 04/21/19  9:30 PM   Specimen: BLOOD RIGHT HAND  Result Value Ref Range Status   Specimen Description BLOOD RIGHT HAND  Final   Special Requests   Final    BOTTLES DRAWN AEROBIC AND ANAEROBIC Blood Culture adequate volume   Culture   Final    NO GROWTH 5 DAYS Performed at [facility], [address], [place], [place] [address]    Report Status 04/27/2019 FINAL  Final  Culture, Urine     Status: Abnormal   Collection Time: 04/22/19  3:01 PM   Specimen: Urine, Clean Catch  Result Value Ref Range Status   Specimen Description URINE, CLEAN CATCH  Final   Special Requests Normal  Final   Culture (A)  Final    <10,000 COLONIES/mL INSIGNIFICANT GROWTH Performed at [facility], [address], [place], [place] [address]    Report  Status 04/26/2019 FINAL  Final    Radiology Studies: No results found.   Hughie Closs, MD [facility]  If 7PM-7AM, please contact night-coverage [email or web address] 04/29/2019, 10:53 AM

## 2019-04-30 LAB — GLUCOSE, CAPILLARY
Glucose-Capillary: 103 mg/dL — ABNORMAL HIGH (ref 70–99)
Glucose-Capillary: 105 mg/dL — ABNORMAL HIGH (ref 70–99)
Glucose-Capillary: 119 mg/dL — ABNORMAL HIGH (ref 70–99)
Glucose-Capillary: 122 mg/dL — ABNORMAL HIGH (ref 70–99)
Glucose-Capillary: 126 mg/dL — ABNORMAL HIGH (ref 70–99)
Glucose-Capillary: 96 mg/dL (ref 70–99)
Glucose-Capillary: 98 mg/dL (ref 70–99)

## 2019-04-30 LAB — RENAL FUNCTION PANEL
Albumin: 3.1 g/dL — ABNORMAL LOW (ref 3.5–5.0)
Anion gap: 11 (ref 5–15)
BUN: 26 mg/dL — ABNORMAL HIGH (ref 6–20)
CO2: 25 mmol/L (ref 22–32)
Calcium: 9.1 mg/dL (ref 8.9–10.3)
Chloride: 105 mmol/L (ref 98–111)
Creatinine, Ser: 0.83 mg/dL (ref 0.44–1.00)
GFR calc Af Amer: >60 mL/min (ref >60)
GFR calc non Af Amer: >60 mL/min (ref >60)
Glucose, Bld: 136 mg/dL — ABNORMAL HIGH (ref 70–99)
Phosphorus: 4.1 mg/dL (ref 2.5–4.6)
Potassium: 4.4 mmol/L (ref 3.5–5.1)
Sodium: 141 mmol/L (ref 135–145)

## 2019-04-30 LAB — MAGNESIUM: Magnesium: 2.2 mg/dL (ref 1.7–2.4)

## 2019-04-30 MED ORDER — LIP MEDEX EX OINT
TOPICAL_OINTMENT | CUTANEOUS | Status: DC | PRN
  Administered 2019-04-30: 1 via TOPICAL
  Filled 2019-04-30: qty 7

## 2019-04-30 MED ORDER — ENSURE ENLIVE PO LIQD
237.0000 mL | Freq: Two times a day (BID) | ORAL | Status: DC
  Administered 2019-04-30 – 2019-05-04 (×8): 237 mL via ORAL

## 2019-04-30 NOTE — TOC Progression Note (Signed)
Transition of Care [facility]) - Progression Note    Patient Details  Name: Amy Casey MRN: [id number] Date of Birth: 1960-06-09  Transition of Care [facility]) CM/SW Contact  Eduard Roux, [place] Phone Number: 04/30/2019, 6:37 PM  Clinical Narrative:     Renewed IVC- expiration date 05/06/2019   Antony Blackbird, MSW, LCSWA Clinical Social Worker       Expected Discharge Plan and Services                                                 Social Determinants of Health (SDOH) Interventions    Readmission Risk Interventions No flowsheet data found.

## 2019-04-30 NOTE — Progress Notes (Addendum)
PROGRESS NOTE  Amy Casey ENI:[id number] DOB: [birth year]   PCP: Patient, No Pcp Per  Patient is from: Home.  DOA: 04/15/2019 LOS: 14  Brief Narrative / Interim history: 59 year old female with history of HTN, depression and schizophrenia sent to ER from [facility] for elevated blood pressure.  Reportedly has been of care site medications for sometimes and taken to [facility] by family members.  Noted to have elevated blood pressure and sent to the ED.  In ED, she was noted to have elevated blood pressure, hypokalemia and AKI for which she was admitted.  IVC paperwork was completed and psychiatry was consulted.   Psychiatry made adjustments to his psych medications and recommended inpatient psych when medically stable.  Patient continued to refuse p.o. intake, care and medications. She also developed AKI and sepsis likely from UTI.  NG tube placed and tube feedings started on 04/23/2019.  Patient had positive RPR with low titer.  T. pallidum antibody positive.  Pharmacy called health department, and there was no documentation of previous treatment for syphilis.  Infectious disease consulted and recommended Bicillin injection x1.  Assessment & Plan: Schizophrenia/major depressive disorder: She did a better job with her breakfast today but not enough.  She still needs continued NG tube feedings to meet nutritional goal.  Appreciate nutrition on board.  I once again encouraged her to eat more so we can pull the NG tube out.   NG tube placed and tube feed initiated on 1/26. -Continue Zyprexa 10 mg BID and Prozac 20 mg daily -P.o. or IM Haldol as needed -Optimize K and magnesium.  Intermittent EKG. -Patent attorney. -Psychiatry following.  Sepsis likely due to UTI: patient had a UA concerning for UTI on admission but did not have signs and symptoms of infection until 1/24 when she spiked fever and became tachycardic.  Repeat UA concerning for UTI.  Urine culture with multiple species.  Blood  cultures negative so far.  Repeat urine culture was not sent. 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  AKI/azotemia: baseline Cr 0.7-0.8 >> 1.35 (admit) > 0.80.  Likely prerenal in the setting of poor p.o. intake.  -Continue tube feed until she takes p.o.  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  HIV negative.  RPR was negative about 8 months ago. -Infectious disease consulted-Bicillin 2.4 MU x1 on 1/27,  and serum VDRL  Essential hypertension: Normotensive. -Continue amlodipine 10 mg daily, metoprolol 25 mg twice daily and as needed hydralazine. -Given history of angioedema with ACEI and noncompliance  Hypernatremia: Resolved.  Hypokalemia: Resolved.  Hypomagnesemia: Resolved.  Nutrition/refusal of p.o. intake -Continue encouraging  -Cortrak and tube feed started 1/26 -Continue multivitamin -Appreciate dietitian input  DVT prophylaxis: Lovenox Code Status: Full code Family Communication:  None present at bedside.  Patient is from: Home Disposition Plan: Discharge to [facility] once able to eat more. Barriers to discharge: Requiring NG tube feedings for nutritional support.  [facility] will not accept patient on NG tube feedings.   Subjective: Seen and examined.  She has no complaints.  Sitter at the bedside.  She ate a good portion of her breakfast today.  Objective: Vitals:   04/29/19 1926 04/29/19 2058 04/30/19 0228 04/30/19 0407  BP:  128/72  124/76  Pulse:  60  (!) 59  Resp: 20 20  14   Temp:  98.4 F (36.9 C)  98.1 F (36.7 C)  TempSrc:  Oral  Oral  SpO2:  99%  97%  Weight:   75.9 kg   Height:        Intake/Output Summary (Last 24 hours) at 04/30/2019 1354 Last data filed at 04/30/2019 1125 Gross per 24 hour  Intake 1530 ml  Output 600 ml  Net 930 ml   Filed Weights   04/28/19 0443 04/29/19 0500 04/30/19 0228  Weight: 75.9 kg 75.8 kg 75.9 kg      Examination  General exam: Appears calm and comfortable  Respiratory system: Clear to auscultation. Respiratory effort normal. Cardiovascular system: S1 & S2 heard, RRR. No JVD, murmurs, rubs, gallops or clicks. No pedal edema. Gastrointestinal system: Abdomen is nondistended, soft and nontender. No organomegaly or masses felt. Normal bowel sounds heard. Central nervous system: Alert and oriented. No focal neurological deficits. Extremities: Symmetric 5 x 5 power. Skin: No rashes, lesions or ulcers.  Psychiatry: Judgement and insight appear poor. Mood & affect flat.   Procedures:  None     Nutrition Problem: Inadequate oral intake Etiology: lethargy/confusion  Signs/Symptoms: meal completion < 25%  Interventions: Tube feeding, Ensure Enlive (each supplement provides 350kcal and 20 grams of protein)    Microbiology summarized: COVID-19 negative. Influenza PCR negative. Blood culture negative Urine culture multiple species Repeat urine culture was not sent  Sch Meds:  Scheduled Meds: . amLODipine  10 mg Per Tube Daily  . enoxaparin (LOVENOX) injection  40 mg Subcutaneous Q24H  . feeding supplement (ENSURE ENLIVE)  237 mL Oral BID BM  . FLUoxetine  20 mg Per Tube Daily  . metoprolol tartrate  25 mg Per Tube BID  . multivitamin  15 mL Per Tube Daily  . OLANZapine  10 mg Per Tube BID   Continuous Infusions: . feeding supplement (OSMOLITE 1.5 CAL) 1,000 mL (04/30/19 0927)   PRN Meds:.acetaminophen **OR** acetaminophen, dextrose, lip balm, metoprolol tartrate  Antimicrobials: Anti-infectives (From admission, onward)   Start     Dose/Rate Route Frequency Ordered Stop   04/24/19 1200  penicillin g benzathine (BICILLIN LA) 1200000 UNIT/2ML injection 2.4 Million Units     2.4 Million Units Intramuscular  Once 04/24/19 1121 04/24/19 1231   04/21/19 1215  cefTRIAXone (ROCEPHIN) 1 g in sodium chloride 0.9 % 100 mL IVPB  Status:  Discontinued     1 g 200 mL/hr over 30  Minutes Intravenous Every 24 hours 04/21/19 1209 04/24/19 0948       I have personally reviewed the following labs and images: CBC: Recent Labs  Lab 04/24/19 0516 04/25/19 0455 04/26/19 0416 04/27/19 0427  WBC 6.9 5.5 6.0 6.5  HGB 13.7 14.1 13.8 12.1  HCT 43.1 44.3 43.7 38.7  MCV 92.3 90.4 91.6 91.5  PLT 332 389 403* 424*   BMP &GFR Recent Labs  Lab 04/25/19 0455 04/25/19 0455 04/26/19 0416 04/27/19 0427 04/28/19 0313 04/29/19 0431 04/30/19 1140  NA 144  --  143 142 141 141  --   K 3.6  --  4.3 4.0 4.3 4.1  --   CL 103  --  104 103 102 102  --   CO2 27  --  26 29 26 28   --   GLUCOSE 127*  --  117* 110* 125* 111*  --   BUN 19  --  22* 24* 29* 29*  --   CREATININE 0.80  --  0.82 0.99 0.94 0.87  --   CALCIUM 9.2  --  9.2 9.1 9.2 9.4  --   MG 2.2   < > 2.2 2.1  2.1 2.2 2.2  PHOS 4.1  --  4.4 4.8* 5.0* 5.2*  --    < > = values in this interval not displayed.   Estimated Creatinine Clearance: 74.4 mL/min (by C-G formula based on SCr of 0.87 mg/dL). Liver & Pancreas: Recent Labs  Lab 04/24/19 0516 04/26/19 0416 04/27/19 0427 04/28/19 0313 04/29/19 0431  ALBUMIN 2.8* 2.9* 2.7* 2.8* 3.0*   No results for input(s): LIPASE, AMYLASE in the last 168 hours. No results for input(s): AMMONIA in the last 168 hours. Diabetic: No results for input(s): HGBA1C in the last 72 hours. Recent Labs  Lab 04/29/19 2056 04/30/19 0009 04/30/19 0403 04/30/19 0746 04/30/19 1140  GLUCAP 105* 122* 103* 98 119*   Cardiac Enzymes: No results for input(s): CKTOTAL, CKMB, CKMBINDEX, TROPONINI in the last 168 hours. No results for input(s): PROBNP in the last 8760 hours. Coagulation Profile: No results for input(s): INR, PROTIME in the last 168 hours. Thyroid Function Tests: No results for input(s): TSH, T4TOTAL, FREET4, T3FREE, THYROIDAB in the last 72 hours. Lipid Profile: No results for input(s): CHOL, HDL, LDLCALC, TRIG, CHOLHDL, LDLDIRECT in the last 72 hours. Anemia Panel: No  results for input(s): VITAMINB12, FOLATE, FERRITIN, TIBC, IRON, RETICCTPCT in the last 72 hours. Urine analysis:    Component Value Date/Time   COLORURINE AMBER (A) 04/21/2019 1254   APPEARANCEUR TURBID (A) 04/21/2019 1254   LABSPEC 1.014 04/21/2019 1254   LABSPEC 1.015 08/21/2018 1154   PHURINE 5.0 04/21/2019 1254   GLUCOSEU NEGATIVE 04/21/2019 1254   HGBUR LARGE (A) 04/21/2019 1254   BILIRUBINUR NEGATIVE 04/21/2019 1254   BILIRUBINUR negative 08/21/2018 1154   BILIRUBINUR n 06/02/2016 1251   KETONESUR 5 (A) 04/21/2019 1254   PROTEINUR 100 (A) 04/21/2019 1254   UROBILINOGEN negative 06/02/2016 1251   UROBILINOGEN 1.0 02/19/2014 1849   NITRITE NEGATIVE 04/21/2019 1254   LEUKOCYTESUR LARGE (A) 04/21/2019 1254   Sepsis Labs: Invalid input(s): PROCALCITONIN, LACTICIDVEN  Microbiology: Recent Results (from the past 240 hour(s))  Culture, Urine     Status: Abnormal   Collection Time: 04/21/19 12:54 PM   Specimen: Urine, Random  Result Value Ref Range Status   Specimen Description URINE, RANDOM  Final   Special Requests   Final    NONE Performed at [facility], [address] [place], [place] [address]    Culture MULTIPLE SPECIES PRESENT, SUGGEST RECOLLECTION (A)  Final   Report Status 04/22/2019 FINAL  Final  Culture, blood (routine x 2)     Status: None   Collection Time: 04/21/19  6:11 PM   Specimen: BLOOD RIGHT HAND  Result Value Ref Range Status   Specimen Description BLOOD RIGHT HAND  Final   Special Requests   Final    BOTTLES DRAWN AEROBIC AND ANAEROBIC Blood Culture adequate volume   Culture   Final    NO GROWTH 5 DAYS Performed at [facility], [address] [place], [place] [address]    Report Status 04/26/2019 FINAL  Final  Culture, blood (routine x 2)     Status: None   Collection Time: 04/21/19  9:30 PM   Specimen: BLOOD RIGHT HAND  Result Value Ref Range Status   Specimen Description BLOOD RIGHT HAND  Final   Special Requests   Final     BOTTLES DRAWN AEROBIC AND ANAEROBIC Blood Culture adequate volume   Culture   Final    NO GROWTH 5 DAYS Performed at [facility], [address] [place],  [place] [address]    Report Status 04/27/2019 FINAL  Final  Culture, Urine     Status: Abnormal   Collection Time: 04/22/19  3:01 PM   Specimen: Urine, Clean Catch  Result Value Ref Range Status   Specimen Description URINE, CLEAN CATCH  Final   Special Requests Normal  Final   Culture (A)  Final    <10,000 COLONIES/mL INSIGNIFICANT GROWTH Performed at [facility], [address], [place], [place] [address]    Report Status 04/26/2019 FINAL  Final    Radiology Studies: No results found.   Hughie Closs, MD [facility]  If 7PM-7AM, please contact night-coverage [email or web address] 04/30/2019, 1:54 PM

## 2019-04-30 NOTE — Progress Notes (Signed)
Nutrition Follow-up   INTERVENTION:   TF via Cortrak: -Osmolite 1.5 @ 50 ml/hr. -Recommend free water flushes of 200 ml every 6 hours (800 ml) -This regimen will provide 1800 kcals, 75g protein and 1714 ml H2O  -Ensure Enlive po BID, each supplement provides 350 kcal and 20 grams of protein  NUTRITION DIAGNOSIS:   Inadequate oral intake related to lethargy/confusion as evidenced by meal completion < 25%.  Ongoing.  GOAL:   Patient will meet greater than or equal to 90% of their needs  Meeting with TF.  MONITOR:   Labs, Weight trends, PO intake, Supplement acceptance, TF tolerance, I & O's  ASSESSMENT:   59 year old female with history of HTN, depression and schizophrenia sent to ER from [facility] for elevated blood pressure.  Reportedly has been of care site medications for sometimes and taken to [facility] by family members.   1/18: admitted for hypokalemia and mild AKI 1/26: Cortrak tube placed, TF initiated  **RD working remotely**  Patient continues to receive Osmolite 1.5 @ 50 ml/hr and is tolerating.  Pt now alert/oriented x 2.  Per PO documentation, pt consumed 25-90% of meals on 1/31. On 2/1, pt consumed 0-40% of meals (~420 kcals, 23g protein).  Pt did eat some eggs this morning for breakfast. Will order Ensure supplements.    Admission weight: 168 lbs. Current weight: 167 lbs.  I/Os: +5.1L since admit UOP: 500 ml x 24 hrs  Medications: Liquid MVI Labs reviewed: CBGs: 98-103  Diet Order:   Diet Order            Diet Heart Room service appropriate? Yes; Fluid consistency: Thin  Diet effective now              EDUCATION NEEDS:   Not appropriate for education at this time  Skin:  Skin Assessment: Reviewed RN Assessment  Last BM:  1/31  Height:   Ht Readings from Last 1 Encounters:  04/29/19 5\' 10"  (1.778 m)    Weight:   Wt Readings from Last 1 Encounters:  04/30/19 75.9 kg    Ideal Body Weight:  68.1 kg  BMI:  Body mass index is  24.01 kg/m.  Estimated Nutritional Needs:   Kcal:  1750-1950  Protein:  80-90g  Fluid:  1.9L/day  06/28/19, MS, RD, LDN Inpatient Clinical Dietitian Pager: [phone] After Hours Pager: [phone]

## 2019-05-01 LAB — GLUCOSE, CAPILLARY
Glucose-Capillary: 100 mg/dL — ABNORMAL HIGH (ref 70–99)
Glucose-Capillary: 110 mg/dL — ABNORMAL HIGH (ref 70–99)
Glucose-Capillary: 111 mg/dL — ABNORMAL HIGH (ref 70–99)
Glucose-Capillary: 97 mg/dL (ref 70–99)
Glucose-Capillary: 98 mg/dL (ref 70–99)
Glucose-Capillary: 99 mg/dL (ref 70–99)

## 2019-05-01 NOTE — Progress Notes (Signed)
PROGRESS NOTE  Genever Hentges ASN:[id number] DOB: 02-07-1961   PCP: Patient, No Pcp Per  Patient is from: Home.  DOA: 04/15/2019 LOS: 15  Brief Narrative / Interim history: 59 year old female with history of HTN, depression and schizophrenia sent to ER from [facility] for elevated blood pressure.  Reportedly has been of care site medications for sometimes and taken to [facility] by family members.  Noted to have elevated blood pressure and sent to the ED.  In ED, she was noted to have elevated blood pressure, hypokalemia and AKI for which she was admitted.  IVC paperwork was completed and psychiatry was consulted.   Psychiatry made adjustments to his psych medications and recommended inpatient psych when medically stable.  Patient continued to refuse p.o. intake, care and medications. She also developed AKI and sepsis likely from UTI.  NG tube placed and tube feedings started on 04/23/2019.  Patient had positive RPR with low titer.  T. pallidum antibody positive.  Pharmacy called health department, and there was no documentation of previous treatment for syphilis.  Infectious disease consulted and recommended Bicillin injection x1.  Assessment & Plan: Schizophrenia/major depressive disorder: Still not taking enough p.o.  Ate only 15% of her breakfast today.  She still needs continued NG tube feedings to meet nutritional goal.  Appreciate nutrition on board.  I once again encouraged her to eat more so we can pull the NG tube out.   NG tube placed and tube feed initiated on 1/26. -Continue Zyprexa 10 mg BID and Prozac 20 mg daily -P.o. or IM Haldol as needed -Optimize K and magnesium.  Intermittent EKG. -Patent attorney. -Psychiatry following.  Sepsis likely due to UTI: patient had a UA concerning for UTI on admission but did not have signs and symptoms of infection until 1/24 when she spiked fever and became tachycardic.  Repeat UA concerning for UTI.  Urine culture with multiple species.   Blood cultures negative so far.  Repeat urine culture was not sent. 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  AKI/azotemia: baseline Cr 0.7-0.8 >> 1.35 (admit) > 0.80.  Likely prerenal in the setting of poor p.o. intake.  -Continue tube feed until she takes p.o.  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  HIV negative.  RPR was negative about 8 months ago. -Infectious disease consulted-Bicillin 2.4 MU x1 on 1/27,  and serum VDRL  Essential hypertension: Normotensive. -Continue amlodipine 10 mg daily, metoprolol 25 mg twice daily and as needed hydralazine. -Given history of angioedema with ACEI and noncompliance  Hypernatremia: Resolved.  Hypokalemia: Resolved.  Hypomagnesemia: Resolved.  Nutrition/refusal of p.o. intake -Continue encouraging  -Cortrak and tube feed started 1/26 -Continue multivitamin -Appreciate dietitian input  DVT prophylaxis: Lovenox Code Status: Full code Family Communication:  None present at bedside.  Patient is from: Home Disposition Plan: Discharge to [facility] once able to eat more. Barriers to discharge: Requiring NG tube feedings for nutritional support.  [facility] will not accept patient on NG tube feedings.   Subjective: Seen and examined.  No complaints.  Continues to ask why she needs NG tube feedings.  Continues to ask when she will be discharged home.  Objective: Vitals:   04/30/19 1450 04/30/19 2022 05/01/19 0529 05/01/19 1028  BP: 104/68 115/61 132/72   Pulse: 75 64 63 66  Resp: 20 18 18    Temp: 98.9 F (37.2 C) 97.9 F (36.6 C) (!) 97.5 F (36.4 C)  TempSrc: Oral Oral Oral   SpO2: 100% 100% 100%   Weight:      Height:        Intake/Output Summary (Last 24 hours) at 05/01/2019 1251 Last data filed at 05/01/2019 1246 Gross per 24 hour  Intake 1600 ml  Output 500 ml  Net 1100 ml   Filed Weights   04/28/19 0443  04/29/19 0500 04/30/19 0228  Weight: 75.9 kg 75.8 kg 75.9 kg    Examination  General exam: Appears calm and comfortable  Respiratory system: Clear to auscultation. Respiratory effort normal. Cardiovascular system: S1 & S2 heard, RRR. No JVD, murmurs, rubs, gallops or clicks. No pedal edema. Gastrointestinal system: Abdomen is nondistended, soft and nontender. No organomegaly or masses felt. Normal bowel sounds heard. Central nervous system: Alert and oriented. No focal neurological deficits. Extremities: Symmetric 5 x 5 power. Skin: No rashes, lesions or ulcers.  Psychiatry: Judgement and insight appear poor. Mood & affect flat.    Procedures:  None     Nutrition Problem: Inadequate oral intake Etiology: lethargy/confusion  Signs/Symptoms: meal completion < 25%  Interventions: Tube feeding, Ensure Enlive (each supplement provides 350kcal and 20 grams of protein)    Microbiology summarized: COVID-19 negative. Influenza PCR negative. Blood culture negative Urine culture multiple species Repeat urine culture was not sent  Sch Meds:  Scheduled Meds: . amLODipine  10 mg Per Tube Daily  . enoxaparin (LOVENOX) injection  40 mg Subcutaneous Q24H  . feeding supplement (ENSURE ENLIVE)  237 mL Oral BID BM  . FLUoxetine  20 mg Per Tube Daily  . metoprolol tartrate  25 mg Per Tube BID  . multivitamin  15 mL Per Tube Daily  . OLANZapine  10 mg Per Tube BID   Continuous Infusions: . feeding supplement (OSMOLITE 1.5 CAL) 1,000 mL (04/30/19 0927)   PRN Meds:.acetaminophen **OR** acetaminophen, dextrose, lip balm, metoprolol tartrate  Antimicrobials: Anti-infectives (From admission, onward)   Start     Dose/Rate Route Frequency Ordered Stop   04/24/19 1200  penicillin g benzathine (BICILLIN LA) 1200000 UNIT/2ML injection 2.4 Million Units     2.4 Million Units Intramuscular  Once 04/24/19 1121 04/24/19 1231   04/21/19 1215  cefTRIAXone (ROCEPHIN) 1 g in sodium chloride 0.9 %  100 mL IVPB  Status:  Discontinued     1 g 200 mL/hr over 30 Minutes Intravenous Every 24 hours 04/21/19 1209 04/24/19 0948       I have personally reviewed the following labs and images: CBC: Recent Labs  Lab 04/25/19 0455 04/26/19 0416 04/27/19 0427  WBC 5.5 6.0 6.5  HGB 14.1 13.8 12.1  HCT 44.3 43.7 38.7  MCV 90.4 91.6 91.5  PLT 389 403* 424*   BMP &GFR Recent Labs  Lab 04/26/19 0416 04/27/19 0427 04/28/19 0313 04/29/19 0431 04/30/19 1140  NA 143 142 141 141 141  K 4.3 4.0 4.3 4.1 4.4  CL 104 103 102 102 105  CO2 26 29 26 28 25   GLUCOSE 117* 110* 125* 111* 136*  BUN 22* 24* 29* 29* 26*  CREATININE 0.82 0.99 0.94 0.87 0.83  CALCIUM 9.2 9.1 9.2 9.4 9.1  MG 2.2 2.1 2.1 2.2 2.2  PHOS 4.4 4.8* 5.0* 5.2* 4.1   Estimated Creatinine Clearance: 77.9 mL/min (by C-G formula based on SCr of 0.83 mg/dL). Liver & Pancreas: Recent Labs  Lab 04/26/19 0416 04/27/19 0427 04/28/19 0313 04/29/19 0431 04/30/19 1140  ALBUMIN 2.9* 2.7* 2.8* 3.0* 3.1*   No results for input(s): LIPASE, AMYLASE in  the last 168 hours. No results for input(s): AMMONIA in the last 168 hours. Diabetic: No results for input(s): HGBA1C in the last 72 hours. Recent Labs  Lab 04/30/19 1703 04/30/19 2029 04/30/19 2337 05/01/19 0517 05/01/19 0731  GLUCAP 96 105* 126* 98 100*   Cardiac Enzymes: No results for input(s): CKTOTAL, CKMB, CKMBINDEX, TROPONINI in the last 168 hours. No results for input(s): PROBNP in the last 8760 hours. Coagulation Profile: No results for input(s): INR, PROTIME in the last 168 hours. Thyroid Function Tests: No results for input(s): TSH, T4TOTAL, FREET4, T3FREE, THYROIDAB in the last 72 hours. Lipid Profile: No results for input(s): CHOL, HDL, LDLCALC, TRIG, CHOLHDL, LDLDIRECT in the last 72 hours. Anemia Panel: No results for input(s): VITAMINB12, FOLATE, FERRITIN, TIBC, IRON, RETICCTPCT in the last 72 hours. Urine analysis:    Component Value Date/Time    COLORURINE AMBER (A) 04/21/2019 1254   APPEARANCEUR TURBID (A) 04/21/2019 1254   LABSPEC 1.014 04/21/2019 1254   LABSPEC 1.015 08/21/2018 1154   PHURINE 5.0 04/21/2019 1254   GLUCOSEU NEGATIVE 04/21/2019 1254   HGBUR LARGE (A) 04/21/2019 1254   BILIRUBINUR NEGATIVE 04/21/2019 1254   BILIRUBINUR negative 08/21/2018 1154   BILIRUBINUR n 06/02/2016 1251   KETONESUR 5 (A) 04/21/2019 1254   PROTEINUR 100 (A) 04/21/2019 1254   UROBILINOGEN negative 06/02/2016 1251   UROBILINOGEN 1.0 02/19/2014 1849   NITRITE NEGATIVE 04/21/2019 1254   LEUKOCYTESUR LARGE (A) 04/21/2019 1254   Sepsis Labs: Invalid input(s): PROCALCITONIN, LACTICIDVEN  Microbiology: Recent Results (from the past 240 hour(s))  Culture, Urine     Status: Abnormal   Collection Time: 04/21/19 12:54 PM   Specimen: Urine, Random  Result Value Ref Range Status   Specimen Description URINE, RANDOM  Final   Special Requests   Final    NONE Performed at [facility], [address] [place], [place] [address]    Culture MULTIPLE SPECIES PRESENT, SUGGEST RECOLLECTION (A)  Final   Report Status 04/22/2019 FINAL  Final  Culture, blood (routine x 2)     Status: None   Collection Time: 04/21/19  6:11 PM   Specimen: BLOOD RIGHT HAND  Result Value Ref Range Status   Specimen Description BLOOD RIGHT HAND  Final   Special Requests   Final    BOTTLES DRAWN AEROBIC AND ANAEROBIC Blood Culture adequate volume   Culture   Final    NO GROWTH 5 DAYS Performed at [facility], [address], [place], [place] [address]    Report Status 04/26/2019 FINAL  Final  Culture, blood (routine x 2)     Status: None   Collection Time: 04/21/19  9:30 PM   Specimen: BLOOD RIGHT HAND  Result Value Ref Range Status   Specimen Description BLOOD RIGHT HAND  Final   Special Requests   Final    BOTTLES DRAWN AEROBIC AND ANAEROBIC Blood Culture adequate volume   Culture   Final    NO GROWTH 5 DAYS Performed at [facility],  [address], [place], [place] [address]    Report Status 04/27/2019 FINAL  Final  Culture, Urine     Status: Abnormal   Collection Time: 04/22/19  3:01 PM   Specimen: Urine, Clean Catch  Result Value Ref Range Status   Specimen Description URINE, CLEAN CATCH  Final   Special Requests Normal  Final   Culture (A)  Final    <10,000 COLONIES/mL INSIGNIFICANT GROWTH Performed at [facility], [address] [place], [place] [address]    Report Status 04/26/2019 FINAL  Final    Radiology Studies: No results found.   Darliss Cheney, MD [facility]  If 7PM-7AM, please contact night-coverage [email or web address] 05/01/2019, 12:51 PM

## 2019-05-02 LAB — GLUCOSE, CAPILLARY
Glucose-Capillary: 103 mg/dL — ABNORMAL HIGH (ref 70–99)
Glucose-Capillary: 86 mg/dL (ref 70–99)
Glucose-Capillary: 84 mg/dL (ref 70–99)
Glucose-Capillary: 94 mg/dL (ref 70–99)
Glucose-Capillary: 121 mg/dL — ABNORMAL HIGH (ref 70–99)

## 2019-05-02 MED ORDER — OLANZAPINE 10 MG PO TABS
10.0000 mg | ORAL_TABLET | Freq: Two times a day (BID) | ORAL | Status: DC
  Administered 2019-05-02 – 2019-05-04 (×4): 10 mg via ORAL
  Filled 2019-05-02 (×5): qty 1

## 2019-05-02 MED ORDER — FLUOXETINE HCL 20 MG PO CAPS
20.0000 mg | ORAL_CAPSULE | Freq: Every day | ORAL | Status: DC
  Administered 2019-05-03 – 2019-05-04 (×2): 20 mg via ORAL
  Filled 2019-05-02 (×2): qty 1

## 2019-05-02 MED ORDER — AMLODIPINE BESYLATE 10 MG PO TABS
10.0000 mg | ORAL_TABLET | Freq: Every day | ORAL | Status: DC
  Administered 2019-05-03 – 2019-05-04 (×2): 10 mg via ORAL
  Filled 2019-05-02 (×2): qty 1

## 2019-05-02 MED ORDER — ADULT MULTIVITAMIN W/MINERALS CH
1.0000 | ORAL_TABLET | Freq: Every day | ORAL | Status: DC
  Administered 2019-05-03 – 2019-05-04 (×2): 1 via ORAL
  Filled 2019-05-02 (×2): qty 1

## 2019-05-02 NOTE — Progress Notes (Addendum)
PROGRESS NOTE  Amy Casey IRS:[id number] DOB: Jun 16, 1960   PCP: Patient, No Pcp Per  Patient is from: Home.  DOA: 04/15/2019 LOS: 16  Brief Narrative / Interim history: 59 year old female with history of HTN, depression and schizophrenia sent to ER from [facility] for elevated blood pressure.  Reportedly has been of care site medications for sometimes and taken to [facility] by family members.  Noted to have elevated blood pressure and sent to the ED.  In ED, she was noted to have elevated blood pressure, hypokalemia and AKI for which she was admitted.  IVC paperwork was completed and psychiatry was consulted.   Psychiatry made adjustments to his psych medications and recommended inpatient psych when medically stable.  Patient continued to refuse p.o. intake, care and medications. She also developed AKI and sepsis likely from UTI.  NG tube placed and tube feedings started on 04/23/2019.  Patient had positive RPR with low titer.  T. pallidum antibody positive.  Pharmacy called health department, and there was no documentation of previous treatment for syphilis.  Infectious disease consulted and recommended Bicillin injection x1.  Assessment & Plan: Schizophrenia/major depressive disorder: Still not taking enough p.o. Per sitter, she ate pancakes this morning.  Did not eat anything else.  P.o. intake improving but not enough to remove NG tube.  I did explain this to the patient and encouraged once again to eat more so we can pull NG tube out.   NG tube placed and tube feed initiated on 1/26. -Continue Zyprexa 10 mg BID and Prozac 20 mg daily -P.o. or IM Haldol as needed -Optimize K and magnesium.  Intermittent EKG. -Patent attorney. -Psychiatry following.  Sepsis likely due to UTI: patient had a UA concerning for UTI on admission but did not have signs and symptoms of infection until 1/24 when she spiked fever and became tachycardic.  Repeat UA concerning for UTI.  Urine culture with  multiple species.  Blood cultures negative so far.  Repeat urine culture was not sent. 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  AKI/azotemia: baseline Cr 0.7-0.8 >> 1.35 (admit) > 0.80.  Likely prerenal in the setting of poor p.o. intake.  -Continue tube feed until she takes p.o.  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  HIV negative.  RPR was negative about 8 months ago. -Infectious disease consulted-Bicillin 2.4 MU x1 on 1/27,  and serum VDRL  Essential hypertension: Normotensive. -Continue amlodipine 10 mg daily, metoprolol 25 mg twice daily and as needed hydralazine. -Given history of angioedema with ACEI and noncompliance  Hypernatremia: Resolved.  Hypokalemia: Resolved.  Hypomagnesemia: Resolved.  Nutrition/refusal of p.o. intake -Continue encouraging  -Cortrak and tube feed started 1/26 -Continue multivitamin -Appreciate dietitian input  DVT prophylaxis: Lovenox Code Status: Full code Family Communication:  None present at bedside.  Received a message from nurse that patient would like for me to call her husband.  I called her husband Mr. Amy Casey at [phone] and left a voicemail.  Also called patient's sister and daughter and left a voicemail. Patient is from: Home Disposition Plan: Discharge to [facility] once able to eat more. Barriers to discharge: Requiring NG tube feedings for nutritional support.  [facility] will not accept patient on NG tube feedings.   Subjective: Seen and examined.  She has no complaint.  Asking when she can go home.  Objective: Vitals:   05/02/19 0730 05/02/19 0956 05/02/19 1000 05/02/19 1244  BP:  117/79  121/68  Pulse:   69 61  Resp: 12 16  17   Temp:    98.1 F (36.7 C)  TempSrc:    Oral  SpO2:    99%  Weight:      Height:        Intake/Output Summary (Last 24 hours) at 05/02/2019 1422 Last data filed at  05/02/2019 1413 Gross per 24 hour  Intake 1810.83 ml  Output 1400 ml  Net 410.83 ml   Filed Weights   04/29/19 0500 04/30/19 0228 05/02/19 0700  Weight: 75.8 kg 75.9 kg 77.2 kg    Examination  General exam: Appears calm and comfortable  Respiratory system: Clear to auscultation. Respiratory effort normal. Cardiovascular system: S1 & S2 heard, RRR. No JVD, murmurs, rubs, gallops or clicks. No pedal edema. Gastrointestinal system: Abdomen is nondistended, soft and nontender. No organomegaly or masses felt. Normal bowel sounds heard. Central nervous system: Alert and oriented. No focal neurological deficits. Extremities: Symmetric 5 x 5 power. Skin: No rashes, lesions or ulcers.  Psychiatry: Judgement and insight appear poor, mood & affect flat.  Procedures:  None     Nutrition Problem: Inadequate oral intake Etiology: lethargy/confusion  Signs/Symptoms: meal completion < 25%  Interventions: Tube feeding, Ensure Enlive (each supplement provides 350kcal and 20 grams of protein)    Microbiology summarized: COVID-19 negative. Influenza PCR negative. Blood culture negative Urine culture multiple species Repeat urine culture was not sent  Sch Meds:  Scheduled Meds: . [START ON 05/03/2019] amLODipine  10 mg Oral Daily  . enoxaparin (LOVENOX) injection  40 mg Subcutaneous Q24H  . feeding supplement (ENSURE ENLIVE)  237 mL Oral BID BM  . [START ON 05/03/2019] FLUoxetine  20 mg Oral Daily  . metoprolol tartrate  25 mg Per Tube BID  . multivitamin with minerals  1 tablet Oral Daily  . OLANZapine  10 mg Oral BID   Continuous Infusions: . feeding supplement (OSMOLITE 1.5 CAL) 50 mL/hr at 05/01/19 1900   PRN Meds:.acetaminophen **OR** acetaminophen, dextrose, lip balm, metoprolol tartrate  Antimicrobials: Anti-infectives (From admission, onward)   Start     Dose/Rate Route Frequency Ordered Stop   04/24/19 1200  penicillin g benzathine (BICILLIN LA) 1200000 UNIT/2ML injection  2.4 Million Units     2.4 Million Units Intramuscular  Once 04/24/19 1121 04/24/19 1231   04/21/19 1215  cefTRIAXone (ROCEPHIN) 1 g in sodium chloride 0.9 % 100 mL IVPB  Status:  Discontinued     1 g 200 mL/hr over 30 Minutes Intravenous Every 24 hours 04/21/19 1209 04/24/19 0948       I have personally reviewed the following labs and images: CBC: Recent Labs  Lab 04/26/19 0416 04/27/19 0427  WBC 6.0 6.5  HGB 13.8 12.1  HCT 43.7 38.7  MCV 91.6 91.5  PLT 403* 424*   BMP &GFR Recent Labs  Lab 04/26/19 0416 04/27/19 0427 04/28/19 0313 04/29/19 0431 04/30/19 1140  NA 143 142 141 141 141  K 4.3 4.0 4.3 4.1 4.4  CL 104 103 102 102 105  CO2 26 29 26 28 25   GLUCOSE 117* 110* 125* 111* 136*  BUN 22* 24* 29* 29* 26*  CREATININE 0.82 0.99 0.94 0.87 0.83  CALCIUM 9.2 9.1 9.2 9.4 9.1  MG 2.2 2.1 2.1 2.2 2.2  PHOS 4.4 4.8* 5.0* 5.2* 4.1   Estimated Creatinine Clearance: 77.9 mL/min (by C-G formula based on SCr of 0.83 mg/dL). Liver & Pancreas: Recent Labs  Lab 04/26/19 0416 04/27/19  0093 04/28/19 0313 04/29/19 0431 04/30/19 1140  ALBUMIN 2.9* 2.7* 2.8* 3.0* 3.1*   No results for input(s): LIPASE, AMYLASE in the last 168 hours. No results for input(s): AMMONIA in the last 168 hours. Diabetic: No results for input(s): HGBA1C in the last 72 hours. Recent Labs  Lab 05/01/19 2043 05/01/19 2342 05/02/19 0409 05/02/19 0704 05/02/19 1148  GLUCAP 97 99 103* 86 84   Cardiac Enzymes: No results for input(s): CKTOTAL, CKMB, CKMBINDEX, TROPONINI in the last 168 hours. No results for input(s): PROBNP in the last 8760 hours. Coagulation Profile: No results for input(s): INR, PROTIME in the last 168 hours. Thyroid Function Tests: No results for input(s): TSH, T4TOTAL, FREET4, T3FREE, THYROIDAB in the last 72 hours. Lipid Profile: No results for input(s): CHOL, HDL, LDLCALC, TRIG, CHOLHDL, LDLDIRECT in the last 72 hours. Anemia Panel: No results for input(s): VITAMINB12,  FOLATE, FERRITIN, TIBC, IRON, RETICCTPCT in the last 72 hours. Urine analysis:    Component Value Date/Time   COLORURINE AMBER (A) 04/21/2019 1254   APPEARANCEUR TURBID (A) 04/21/2019 1254   LABSPEC 1.014 04/21/2019 1254   LABSPEC 1.015 08/21/2018 1154   PHURINE 5.0 04/21/2019 1254   GLUCOSEU NEGATIVE 04/21/2019 1254   HGBUR LARGE (A) 04/21/2019 1254   BILIRUBINUR NEGATIVE 04/21/2019 1254   BILIRUBINUR negative 08/21/2018 1154   BILIRUBINUR n 06/02/2016 1251   KETONESUR 5 (A) 04/21/2019 1254   PROTEINUR 100 (A) 04/21/2019 1254   UROBILINOGEN negative 06/02/2016 1251   UROBILINOGEN 1.0 02/19/2014 1849   NITRITE NEGATIVE 04/21/2019 1254   LEUKOCYTESUR LARGE (A) 04/21/2019 1254   Sepsis Labs: Invalid input(s): PROCALCITONIN, LACTICIDVEN  Microbiology: Recent Results (from the past 240 hour(s))  Culture, Urine     Status: Abnormal   Collection Time: 04/22/19  3:01 PM   Specimen: Urine, Clean Catch  Result Value Ref Range Status   Specimen Description URINE, CLEAN CATCH  Final   Special Requests Normal  Final   Culture (A)  Final    <10,000 COLONIES/mL INSIGNIFICANT GROWTH Performed at [facility], [address] [place], [place] [address]    Report Status 04/26/2019 FINAL  Final    Radiology Studies: No results found.   Hughie Closs, MD [facility]  If 7PM-7AM, please contact night-coverage [email or web address] 05/02/2019, 2:22 PM

## 2019-05-02 NOTE — Plan of Care (Signed)
  Problem: Clinical Measurements: Goal: Ability to maintain clinical measurements within normal limits will improve Outcome: Progressing   Problem: Nutrition: Goal: Adequate nutrition will be maintained Outcome: Progressing   

## 2019-05-03 ENCOUNTER — Encounter (HOSPITAL_COMMUNITY): Payer: Self-pay | Admitting: Internal Medicine

## 2019-05-03 LAB — GLUCOSE, CAPILLARY
Glucose-Capillary: 103 mg/dL — ABNORMAL HIGH (ref 70–99)
Glucose-Capillary: 114 mg/dL — ABNORMAL HIGH (ref 70–99)
Glucose-Capillary: 120 mg/dL — ABNORMAL HIGH (ref 70–99)
Glucose-Capillary: 84 mg/dL (ref 70–99)
Glucose-Capillary: 94 mg/dL (ref 70–99)
Glucose-Capillary: 98 mg/dL (ref 70–99)

## 2019-05-03 LAB — BASIC METABOLIC PANEL
Anion gap: 10 (ref 5–15)
BUN: 35 mg/dL — ABNORMAL HIGH (ref 6–20)
CO2: 27 mmol/L (ref 22–32)
Calcium: 9.3 mg/dL (ref 8.9–10.3)
Chloride: 104 mmol/L (ref 98–111)
Creatinine, Ser: 0.85 mg/dL (ref 0.44–1.00)
GFR calc Af Amer: >60 mL/min (ref >60)
GFR calc non Af Amer: >60 mL/min (ref >60)
Glucose, Bld: 121 mg/dL — ABNORMAL HIGH (ref 70–99)
Potassium: 4.3 mmol/L (ref 3.5–5.1)
Sodium: 141 mmol/L (ref 135–145)

## 2019-05-03 MED ORDER — POLYETHYLENE GLYCOL 3350 17 G PO PACK
17.0000 g | PACK | Freq: Every day | ORAL | Status: DC
  Filled 2019-05-03 (×2): qty 1

## 2019-05-03 NOTE — Progress Notes (Signed)
Per sitter pt ate all of her breakfast this morning. Pt very hesitant to take her medication and states that she doesn't feel like she should be taking "this many meds". RN adv her that MD has ordered these meds. Pt anxious to get NG tube out, sts she needs to get out of hospital so she can "go to work". RN asked pt if she has had a BM in the last few days and she said no but that she feels like she will today. RN and sitter encouraged pt to continue eating all of her meals and taking scheduled medications as ordered.

## 2019-05-03 NOTE — Progress Notes (Signed)
Nutrition Follow-up  INTERVENTION:   -D/c tube feeds and Cortrak tube  -Continue Ensure Enlive po BID, each supplement provides 350 kcal and 20 grams of protein  -can continue Ensure at inpt psych facility  NUTRITION DIAGNOSIS:   Inadequate oral intake related to lethargy/confusion as evidenced by meal completion < 25%.  Improved.  GOAL:   Patient will meet greater than or equal to 90% of their needs  Progressing.  MONITOR:   Labs, Weight trends, PO intake, Supplement acceptance, TF tolerance, I & O's  ASSESSMENT:   59 year old female with history of HTN, depression and schizophrenia sent to ER from [facility] for elevated blood pressure.  Reportedly has been of care site medications for sometimes and taken to [facility] by family members.  1/18: admitted for hypokalemia and mild AKI 1/26: Cortrak tube placed, TF initiated  **RD working remotely**  Nutrition team received page from [employer] regarding if pt needs to continue TF.  Per RN, pt has eaten very well today, 100% of breakfast and lunch. Breakfast provided ~330 kcals and 20g protein Lunch provided ~750 kcals and 39g protein Pt has already ordered her dinner for tonight which will provide ~790 kcals and 17g of protein. Pt has also drank both Ensures today (~700 kcals and 40g protein). In total, this would provide 2570 kcals and 116g of protein.   Given improved PO intake, recommend d/c tube feed and Cortrak tube.   If pt to discharge to [facility], can continue protein shakes there.   Admission weight: 168 lbs. Current weight: 172 lbs.  I/Os: +5.5L since admit UOP: 600 ml x 24 hrs  Medications: Multivitamin with minerals daily Labs reviewed: CBGs: 94-98  Diet Order:   Diet Order            Diet Heart Room service appropriate? Yes; Fluid consistency: Thin  Diet effective now              EDUCATION NEEDS:   Not appropriate for education at this time  Skin:  Skin Assessment: Reviewed RN Assessment  Last BM:   1/31  Height:   Ht Readings from Last 1 Encounters:  04/29/19 5\' 10"  (1.778 m)    Weight:   Wt Readings from Last 1 Encounters:  05/03/19 78.4 kg   BMI:  Body mass index is 24.81 kg/m.  Estimated Nutritional Needs:   Kcal:  1750-1950  Protein:  80-90g  Fluid:  1.9L/day  07/01/19, MS, RD, LDN Inpatient Clinical Dietitian Contact information available via Amion

## 2019-05-03 NOTE — Progress Notes (Addendum)
PROGRESS NOTE  Satina Jerrell YQM:[id number] DOB: 04-17-1960   PCP: Patient, No Pcp Per  Patient is from: Home.  DOA: 04/15/2019 LOS: 17  Brief Narrative / Interim history: 59 year old female with history of HTN, depression and schizophrenia sent to ER from [facility] for elevated blood pressure.  Reportedly has been of care site medications for sometimes and taken to [facility] by family members.  Noted to have elevated blood pressure and sent to the ED.  In ED, she was noted to have elevated blood pressure, hypokalemia and AKI for which she was admitted.  IVC paperwork was completed and psychiatry was consulted.   Psychiatry made adjustments to his psych medications and recommended inpatient psych when medically stable.  Patient continued to refuse p.o. intake, care and medications. She also developed AKI and sepsis likely from UTI.  NG tube placed and tube feedings started on 04/23/2019.  Patient had positive RPR with low titer.  T. pallidum antibody positive.  Pharmacy called health department, and there was no documentation of previous treatment for syphilis.  Infectious disease consulted and recommended Bicillin injection x1.  Assessment & Plan: Schizophrenia/major depressive disorder: Patient is a started to turn around now.  For the first time in all loose 17 days that she has been hospitalized, she ate full breakfast and then full lunch.  There is no note from nutrition department in last 2 days.  I just discussed this with the nurse and advised her to call them and asked them to assess patient to see when we can pull the NG tube out and stop NG tube feedings.    NG tube placed and tube feed initiated on 1/26. -Continue Zyprexa 10 mg BID and Prozac 20 mg daily -P.o. or IM Haldol as needed -Optimize K and magnesium.  Intermittent EKG. -Patent attorney. -Psychiatry has recommended admission to [facility] once she is able to have adequate oral intake as they do not take patients with NG tube  feedings.  Psychiatry not following.  Last saw patient about 9 days ago.  Will reconsult psychiatry today to see what their updated recommendations are.  Sepsis likely due to UTI: patient had a UA concerning for UTI on admission but did not have signs and symptoms of infection until 1/24 when she spiked fever and became tachycardic.  Repeat UA concerning for UTI.  Urine culture with multiple species.  Blood cultures negative so far.  Repeat urine culture was not sent. 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  AKI/azotemia: baseline Cr 0.7-0.8 >> 1.35 (admit) > 0.80.  Likely prerenal in the setting of poor p.o. intake.  -Continue tube feed until she takes p.o.  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  HIV negative.  RPR was negative about 8 months ago. -Infectious disease consulted-Bicillin 2.4 MU x1 on 1/27,  and serum VDRL  Essential hypertension: Normotensive. -Continue amlodipine 10 mg daily, metoprolol 25 mg twice daily and as needed hydralazine. -Given history of angioedema with ACEI and noncompliance  Hypernatremia: Resolved.  Hypokalemia: Resolved.  Hypomagnesemia: Resolved.  Nutrition/refusal of p.o. intake -Continue encouraging .  Has a started to eat better. -Cortrak and tube feed started 1/26 -Continue multivitamin -Appreciate dietitian input.  RN to reach out to dietitian to provide their fresh recommendations.  Constipation: Will start on MiraLAX daily.  DVT prophylaxis: Lovenox Code Status: Full code Family Communication:  None present at bedside.  Called and discussed in length  with her husband [name] over the phone.  Answered several questions.  He was questioning why she needs admission to inpatient psychiatry unit.  He would like to talk to psychiatrist himself.  Advised him to reach out to the nurse so he can be connected. Patient is from:  Home Disposition Plan: Discharge to [facility] once able to eat more. Barriers to discharge: Requiring NG tube feedings for nutritional support.  [facility] will not accept patient on NG tube feedings.   Subjective: Seen and examined.  Doing much better today.  Wants to get out of here as soon as possible.  Finally has a started to eat better and completed 100% of breakfast and lunch today.  Objective: Vitals:   05/02/19 2212 05/03/19 0500 05/03/19 0932 05/03/19 1405  BP:  130/69 131/88 103/60  Pulse: 84 65 (!) 107 73  Resp:  14  15  Temp:  98.1 F (36.7 C)  97.7 F (36.5 C)  TempSrc:  Oral  Oral  SpO2:  96%  99%  Weight:  78.4 kg    Height:        Intake/Output Summary (Last 24 hours) at 05/03/2019 1456 Last data filed at 05/03/2019 1320 Gross per 24 hour  Intake 420 ml  Output 600 ml  Net -180 ml   Filed Weights   04/30/19 0228 05/02/19 0700 05/03/19 0500  Weight: 75.9 kg 77.2 kg 78.4 kg    Examination  General exam: Appears calm and comfortable  Respiratory system: Clear to auscultation. Respiratory effort normal. Cardiovascular system: S1 & S2 heard, RRR. No JVD, murmurs, rubs, gallops or clicks. No pedal edema. Gastrointestinal system: Abdomen is nondistended, soft and nontender. No organomegaly or masses felt. Normal bowel sounds heard. Central nervous system: Alert and oriented. No focal neurological deficits. Extremities: Symmetric 5 x 5 power. Skin: No rashes, lesions or ulcers.  Psychiatry: Judgement and insight appear poor. Mood & affect flat.  Procedures:  None     Nutrition Problem: Inadequate oral intake Etiology: lethargy/confusion  Signs/Symptoms: meal completion < 25%  Interventions: Tube feeding, Ensure Enlive (each supplement provides 350kcal and 20 grams of protein)    Microbiology summarized: COVID-19 negative. Influenza PCR negative. Blood culture negative Urine culture multiple species Repeat urine culture was not sent  Sch Meds:  Scheduled  Meds: . amLODipine  10 mg Oral Daily  . enoxaparin (LOVENOX) injection  40 mg Subcutaneous Q24H  . feeding supplement (ENSURE ENLIVE)  237 mL Oral BID BM  . FLUoxetine  20 mg Oral Daily  . metoprolol tartrate  25 mg Per Tube BID  . multivitamin with minerals  1 tablet Oral Daily  . OLANZapine  10 mg Oral BID  . polyethylene glycol  17 g Oral Daily   Continuous Infusions: . feeding supplement (OSMOLITE 1.5 CAL) 1,000 mL (05/03/19 0940)   PRN Meds:.acetaminophen **OR** acetaminophen, dextrose, lip balm, metoprolol tartrate  Antimicrobials: Anti-infectives (From admission, onward)   Start     Dose/Rate Route Frequency Ordered Stop   04/24/19 1200  penicillin g benzathine (BICILLIN LA) 1200000 UNIT/2ML injection 2.4 Million Units     2.4 Million Units Intramuscular  Once 04/24/19 1121 04/24/19 1231   04/21/19 1215  cefTRIAXone (ROCEPHIN) 1 g in sodium chloride 0.9 % 100 mL IVPB  Status:  Discontinued     1 g 200 mL/hr over 30 Minutes Intravenous Every 24 hours 04/21/19 1209 04/24/19 0948       I have personally reviewed the following labs and images: CBC: Recent Labs  Lab 04/27/19 0427  WBC 6.5  HGB 12.1  HCT 38.7  MCV 91.5  PLT 424*   BMP &GFR Recent Labs  Lab 04/27/19 0427 04/28/19 0313 04/29/19 0431 04/30/19 1140 05/03/19 0333  NA 142 141 141 141 141  K 4.0 4.3 4.1 4.4 4.3  CL 103 102 102 105 104  CO2 29 26 28 25 27   GLUCOSE 110* 125* 111* 136* 121*  BUN 24* 29* 29* 26* 35*  CREATININE 0.99 0.94 0.87 0.83 0.85  CALCIUM 9.1 9.2 9.4 9.1 9.3  MG 2.1 2.1 2.2 2.2  --   PHOS 4.8* 5.0* 5.2* 4.1  --    Estimated Creatinine Clearance: 76.1 mL/min (by C-G formula based on SCr of 0.85 mg/dL). Liver & Pancreas: Recent Labs  Lab 04/27/19 0427 04/28/19 0313 04/29/19 0431 04/30/19 1140  ALBUMIN 2.7* 2.8* 3.0* 3.1*   No results for input(s): LIPASE, AMYLASE in the last 168 hours. No results for input(s): AMMONIA in the last 168 hours. Diabetic: No results for  input(s): HGBA1C in the last 72 hours. Recent Labs  Lab 05/02/19 1959 05/03/19 0038 05/03/19 0518 05/03/19 0722 05/03/19 1124  GLUCAP 121* 120* 114* 94 98   Cardiac Enzymes: No results for input(s): CKTOTAL, CKMB, CKMBINDEX, TROPONINI in the last 168 hours. No results for input(s): PROBNP in the last 8760 hours. Coagulation Profile: No results for input(s): INR, PROTIME in the last 168 hours. Thyroid Function Tests: No results for input(s): TSH, T4TOTAL, FREET4, T3FREE, THYROIDAB in the last 72 hours. Lipid Profile: No results for input(s): CHOL, HDL, LDLCALC, TRIG, CHOLHDL, LDLDIRECT in the last 72 hours. Anemia Panel: No results for input(s): VITAMINB12, FOLATE, FERRITIN, TIBC, IRON, RETICCTPCT in the last 72 hours. Urine analysis:    Component Value Date/Time   COLORURINE AMBER (A) 04/21/2019 1254   APPEARANCEUR TURBID (A) 04/21/2019 1254   LABSPEC 1.014 04/21/2019 1254   LABSPEC 1.015 08/21/2018 1154   PHURINE 5.0 04/21/2019 1254   GLUCOSEU NEGATIVE 04/21/2019 1254   HGBUR LARGE (A) 04/21/2019 1254   BILIRUBINUR NEGATIVE 04/21/2019 1254   BILIRUBINUR negative 08/21/2018 1154   BILIRUBINUR n 06/02/2016 1251   KETONESUR 5 (A) 04/21/2019 1254   PROTEINUR 100 (A) 04/21/2019 1254   UROBILINOGEN negative 06/02/2016 1251   UROBILINOGEN 1.0 02/19/2014 1849   NITRITE NEGATIVE 04/21/2019 1254   LEUKOCYTESUR LARGE (A) 04/21/2019 1254   Sepsis Labs: Invalid input(s): PROCALCITONIN, [place]  Microbiology: No results found for this or any previous visit (from the past 240 hour(s)).  Radiology Studies: No results found.   Darliss Cheney, MD [facility]  If 7PM-7AM, please contact night-coverage [email or web address] 05/03/2019, 2:56 PM

## 2019-05-03 NOTE — Progress Notes (Signed)
Pt refused Miralax, RN and sitter adv her of importance of having regular BM's. RN adv her we don't have a documented BM since 1/31, she said she can't take laxatives or stool softeners because she'll "make a big mess everywhere". RN and sitter adv pt hat we would help her to the bathroom and explained the difference between stool softeners and laxatives and assured her that Miralax is not a laxative. She adamantly refused. RN asked if we could agree that if she does not have a BM today then she will take the Miralax tmrrw, and pt said "it'll happen today. It's just because I'm not at home".

## 2019-05-04 LAB — GLUCOSE, CAPILLARY
Glucose-Capillary: 75 mg/dL (ref 70–99)
Glucose-Capillary: 82 mg/dL (ref 70–99)
Glucose-Capillary: 88 mg/dL (ref 70–99)

## 2019-05-04 MED ORDER — FLUOXETINE HCL 20 MG PO CAPS: 20.0000 mg | ORAL_CAPSULE | Freq: Every day | ORAL | 0 refills | Status: DC

## 2019-05-04 MED ORDER — ENSURE ENLIVE PO LIQD: 237.0000 mL | Freq: Two times a day (BID) | ORAL | 0 refills | Status: AC

## 2019-05-04 NOTE — Progress Notes (Signed)
PROGRESS NOTE  Lulu Hirschmann IZT:[id number] DOB: 21-Jan-1961   PCP: Patient, No Pcp Per  Patient is from: Home.  DOA: 04/15/2019 LOS: 18  Brief Narrative / Interim history: 59 year old female with history of HTN, depression and schizophrenia sent to ER from [facility] for elevated blood pressure.  Reportedly has been of care site medications for sometimes and taken to [facility] by family members.  Noted to have elevated blood pressure and sent to the ED.  In ED, she was noted to have elevated blood pressure, hypokalemia and AKI for which she was admitted.  IVC paperwork was completed and psychiatry was consulted.   Psychiatry made adjustments to his psych medications and recommended inpatient psych when medically stable.  Patient continued to refuse p.o. intake, care and medications. She also developed AKI and sepsis likely from UTI.  NG tube placed and tube feedings started on 04/23/2019.  Patient had positive RPR with low titer.  T. pallidum antibody positive.  Pharmacy called health department, and there was no documentation of previous treatment for syphilis.  Infectious disease consulted and recommended Bicillin injection x1.  Assessment & Plan: Schizophrenia/major depressive disorder: Patient has been eating almost 100% of all her meals since yesterday.  Seen by nutrition team again and they recommended removing cortrak.  We will remove that today.  Continue nutritional support.  NG tube placed and tube feed initiated on 1/26 and stopped 05/03/2019. -Continue Zyprexa 10 mg BID and Prozac 20 mg daily -P.o. or IM Haldol as needed -Optimize K and magnesium.  Intermittent EKG. -Chief Technology Officer. -Psychiatry has recommended admission to [facility] once she is able to have adequate oral intake as they do not take patients with NG tube feedings.  Psychiatry not following.  Last saw patient about 10 days ago.  Reconsulted psychiatry on 05/03/2019.  Waiting for them to see her and provide recommendations  if she is still needs hospitalization to behavioral health.  Sepsis likely due to UTI: patient had a UA concerning for UTI on admission but did not have signs and symptoms of infection until 1/24 when she spiked fever and became tachycardic.  Repeat UA concerning for UTI.  Urine culture with multiple species.  Blood cultures negative so far.  Repeat urine culture was not sent. 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  AKI/azotemia: baseline Cr 0.7-0.8 >> 1.35 (admit) > 0.80.  Likely prerenal in the setting of poor p.o. intake.  -Continue tube feed until she takes p.o.  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  HIV negative.  RPR was negative about 8 months ago. -Infectious disease consulted-Bicillin 2.4 MU x1 on 1/27,  and serum VDRL  Essential hypertension: Normotensive. -Continue amlodipine 10 mg daily, metoprolol 25 mg twice daily and as needed hydralazine. -Given history of angioedema with ACEI and noncompliance  Hypernatremia: Resolved.  Hypokalemia: Resolved.  Hypomagnesemia: Resolved.  Nutrition/refusal of p.o. intake -Continue encouraging .  Has a started to eat better. -Cortrak and tube feed started 1/26 -Continue multivitamin -Appreciate dietitian input.  RN to reach out to dietitian to provide their fresh recommendations.  Constipation: Will start on MiraLAX daily.  DVT prophylaxis: Lovenox Code Status: Full code Family Communication:  None present at bedside.  Called and discussed in length with her husband Braulio Conte over the phone on 05/03/2019.  Answered several questions.  He was questioning why she needs admission to inpatient psychiatry unit.  He would like to talk to  psychiatrist himself.  Advised him to reach out to the nurse so he can be connected. Patient is from: Home Disposition Plan: Discharge to [facility] once able to eat more. Barriers to  discharge: Medically cleared and stable.  Awaiting psychiatry to see her again and possibly admit to behavioral health..   Subjective: Seen and examined.  No complaints.  Doing well.  Ate 100% of her breakfast this morning again.  Objective: Vitals:   05/04/19 0434 05/04/19 0556 05/04/19 1006 05/04/19 1112  BP: 125/72 (!) 157/89 128/86 128/86  Pulse: 63 64    Resp: 14 12 11    Temp: 98 F (36.7 C) 98.2 F (36.8 C)    TempSrc: Oral Oral    SpO2: 99% 100%    Weight:  78.3 kg    Height:        Intake/Output Summary (Last 24 hours) at 05/04/2019 1141 Last data filed at 05/04/2019 0734 Gross per 24 hour  Intake 2250.83 ml  Output 300 ml  Net 1950.83 ml   Filed Weights   05/02/19 0700 05/03/19 0500 05/04/19 0556  Weight: 77.2 kg 78.4 kg 78.3 kg    Examination  General exam: Appears calm and comfortable  Respiratory system: Clear to auscultation. Respiratory effort normal. Cardiovascular system: S1 & S2 heard, RRR. No JVD, murmurs, rubs, gallops or clicks. No pedal edema. Gastrointestinal system: Abdomen is nondistended, soft and nontender. No organomegaly or masses felt. Normal bowel sounds heard. Central nervous system: Alert and oriented. No focal neurological deficits. Extremities: Symmetric 5 x 5 power. Skin: No rashes, lesions or ulcers.  Psychiatry: Judgement and insight appear poor, mood & affect flat  Procedures:  None     Nutrition Problem: Inadequate oral intake Etiology: lethargy/confusion  Signs/Symptoms: meal completion < 25%  Interventions: Tube feeding, Ensure Enlive (each supplement provides 350kcal and 20 grams of protein)    Microbiology summarized: COVID-19 negative. Influenza PCR negative. Blood culture negative Urine culture multiple species Repeat urine culture was not sent  Sch Meds:  Scheduled Meds: . amLODipine  10 mg Oral Daily  . enoxaparin (LOVENOX) injection  40 mg Subcutaneous Q24H  . feeding supplement (ENSURE ENLIVE)  237 mL  Oral BID BM  . FLUoxetine  20 mg Oral Daily  . metoprolol tartrate  25 mg Per Tube BID  . multivitamin with minerals  1 tablet Oral Daily  . OLANZapine  10 mg Oral BID  . polyethylene glycol  17 g Oral Daily   Continuous Infusions:  PRN Meds:.acetaminophen **OR** acetaminophen, dextrose, lip balm, metoprolol tartrate  Antimicrobials: Anti-infectives (From admission, onward)   Start     Dose/Rate Route Frequency Ordered Stop   04/24/19 1200  penicillin g benzathine (BICILLIN LA) 1200000 UNIT/2ML injection 2.4 Million Units     2.4 Million Units Intramuscular  Once 04/24/19 1121 04/24/19 1231   04/21/19 1215  cefTRIAXone (ROCEPHIN) 1 g in sodium chloride 0.9 % 100 mL IVPB  Status:  Discontinued     1 g 200 mL/hr over 30 Minutes Intravenous Every 24 hours 04/21/19 1209 04/24/19 0948       I have personally reviewed the following labs and images: CBC: No results for input(s): WBC, NEUTROABS, HGB, HCT, MCV, PLT in the last 168 hours. BMP &GFR Recent Labs  Lab 04/28/19 0313 04/29/19 0431 04/30/19 1140 05/03/19 0333  NA 141 141 141 141  K 4.3 4.1 4.4 4.3  CL 102 102 105 104  CO2 26 28 25 27   GLUCOSE 125* 111* 136* 121*  BUN 29* 29* 26* 35*  CREATININE 0.94 0.87 0.83 0.85  CALCIUM 9.2 9.4 9.1 9.3  MG 2.1 2.2 2.2  --   PHOS 5.0* 5.2* 4.1  --    Estimated Creatinine Clearance: 76.1 mL/min (by C-G formula based on SCr of 0.85 mg/dL). Liver & Pancreas: Recent Labs  Lab 04/28/19 0313 04/29/19 0431 04/30/19 1140  ALBUMIN 2.8* 3.0* 3.1*   No results for input(s): LIPASE, AMYLASE in the last 168 hours. No results for input(s): AMMONIA in the last 168 hours. Diabetic: No results for input(s): HGBA1C in the last 72 hours. Recent Labs  Lab 05/03/19 1542 05/03/19 2040 05/03/19 2357 05/04/19 0400 05/04/19 0732  GLUCAP 103* 84 88 75 82   Cardiac Enzymes: No results for input(s): CKTOTAL, CKMB, CKMBINDEX, TROPONINI in the last 168 hours. No results for input(s): PROBNP in  the last 8760 hours. Coagulation Profile: No results for input(s): INR, PROTIME in the last 168 hours. Thyroid Function Tests: No results for input(s): TSH, T4TOTAL, FREET4, T3FREE, THYROIDAB in the last 72 hours. Lipid Profile: No results for input(s): CHOL, HDL, LDLCALC, TRIG, CHOLHDL, LDLDIRECT in the last 72 hours. Anemia Panel: No results for input(s): VITAMINB12, FOLATE, FERRITIN, TIBC, IRON, RETICCTPCT in the last 72 hours. Urine analysis:    Component Value Date/Time   COLORURINE AMBER (A) 04/21/2019 1254   APPEARANCEUR TURBID (A) 04/21/2019 1254   LABSPEC 1.014 04/21/2019 1254   LABSPEC 1.015 08/21/2018 1154   PHURINE 5.0 04/21/2019 1254   GLUCOSEU NEGATIVE 04/21/2019 1254   HGBUR LARGE (A) 04/21/2019 1254   BILIRUBINUR NEGATIVE 04/21/2019 1254   BILIRUBINUR negative 08/21/2018 1154   BILIRUBINUR n 06/02/2016 1251   KETONESUR 5 (A) 04/21/2019 1254   PROTEINUR 100 (A) 04/21/2019 1254   UROBILINOGEN negative 06/02/2016 1251   UROBILINOGEN 1.0 02/19/2014 1849   NITRITE NEGATIVE 04/21/2019 1254   LEUKOCYTESUR LARGE (A) 04/21/2019 1254   Sepsis Labs: Invalid input(s): PROCALCITONIN, LACTICIDVEN  Microbiology: No results found for this or any previous visit (from the past 240 hour(s)).  Radiology Studies: No results found.   Hughie Closs, MD [facility]  If 7PM-7AM, please contact night-coverage [email or web address] 05/04/2019, 11:41 AM

## 2019-05-04 NOTE — Discharge Summary (Addendum)
Physician Discharge Summary  Amy Casey XBD:[id number] DOB: [birth year] DOA: 04/15/2019  PCP: Patient, No Pcp Per  Admit date: 04/15/2019 Discharge date: 05/04/2019  Admitted From: Home Disposition: Home  Recommendations for Outpatient Follow-up:  1. Follow up with PCP in 1-2 weeks 2. Follow with psychiatry in 2 to 4 weeks 3. Please obtain BMP/CBC in one week 4. Please follow up on the following pending results:  Home Health: None Equipment/Devices: None  Discharge Condition: Stable CODE STATUS: Full code Diet recommendation: Cardiac  Subjective: Seen and examined.  No complaints.  Brief/Interim Summary: 59 year old female with history of HTN, depression and schizophrenia sent to ER from [facility] for elevated blood pressure.  Reportedly has been of care site medications for sometimes and taken to [facility] by family members.  Noted to have elevated blood pressure and sent to the ED.  In ED, she was noted to have elevated blood pressure, hypokalemia and AKI for which she was admitted.  IVC paperwork was completed and psychiatry was consulted.   Psychiatry made adjustments to his psych medications and recommended inpatient psych when medically stable.  Patient continued to refuse p.o. intake, care and medications. She also developed AKI and sepsis likely from UTI which was treated with antibiotics and her sepsis resolved.  NG tube placed and tube feedings started on 04/23/2019.  Eventually, patient started eating about 100% of oral meals on 05/03/2019 while she was also being supplemented with NG tube feedings.  Nutrition team saw her again and recommended removing NG tube which was removed today on 05/04/2019.  Patient improved psychiatrically as well so psychiatry was reconsulted to assess her for any further need of behavioral health hospitalization.  Fortunately, psychiatrist also agreed that patient had improved and stated that she does not need inpatient hospitalization anymore and they  cleared her for discharge.  I called patient's husband and updated him about this plan and he was very happy to hear that he is going to finally take his wife home.  He has recommended continuing Prozac 20 mg daily as well as Zyprexa 20 mg nightly.  Patient also had some electrolyte abnormalities which were corrected.  Of note.  Patient had positive RPR with low titer.  T. pallidum antibody positive.  Pharmacy called health department, and there was no documentation of previous treatment for syphilis.  Infectious disease consulted and recommended Bicillin injection x1.  Discharge Diagnoses:  Principal Problem:   Schizophrenia ([place]) Active Problems:   Essential hypertension, benign   Noncompliance   Involuntary commitment   AKI (acute kidney injury) ([place])   Encephalopathy   Hypokalemia   Positive serology for syphilis    Discharge Instructions  Discharge Instructions    Discharge patient   Complete by: As directed    Discharge disposition: 01-Home or Self Care   Discharge patient date: 05/04/2019     Allergies as of 05/04/2019      Reactions   Paliperidone Other (See Comments)   Angioedema, drooling, slurred speech, ptosis   Lisinopril Rash, Other (See Comments)   Angioedema   Sulfa Antibiotics Itching      Medication List    TAKE these medications   amLODipine 10 MG tablet Commonly known as: NORVASC Take 1 tablet (10 mg total) by mouth daily. What changed: Another medication with the same name was removed. Continue taking this medication, and follow the directions you see here.   docusate sodium 100 MG capsule Commonly known as: COLACE Take 2 capsules (200 mg total) by mouth daily.  feeding supplement (ENSURE ENLIVE) Liqd Take 237 mLs by mouth 2 (two) times daily between meals. Start taking on: May 05, 2019   FLUoxetine 20 MG capsule Commonly known as: PROZAC Take 1 capsule (20 mg total) by mouth daily. Start taking on: May 05, 2019 What changed:    medication strength  how much to take  when to take this  Another medication with the same name was removed. Continue taking this medication, and follow the directions you see here.   ibuprofen 200 MG tablet Commonly known as: ADVIL Take 200-400 mg by mouth every 6 (six) hours as needed for headache (pain).   OLANZapine 20 MG tablet Commonly known as: ZYPREXA Take 1 tablet (20 mg total) by mouth at bedtime. What changed: Another medication with the same name was removed. Continue taking this medication, and follow the directions you see here.      Follow-up Information    [facility]. Call.   Contact information: [address] [place] [address] [phone]          Allergies  Allergen Reactions  . Paliperidone Other (See Comments)    Angioedema, drooling, slurred speech, ptosis  . Lisinopril Rash and Other (See Comments)    Angioedema   . Sulfa Antibiotics Itching    Consultations: ID and psychiatry   Procedures/Studies: DG Chest Port 1 View  Result Date: 04/21/2019 CLINICAL DATA:  Tachypnea EXAM: PORTABLE CHEST 1 VIEW COMPARISON:  Chest radiograph 07/30/2018 FINDINGS: Borderline cardiomegaly, unchanged. Portions of the left hemidiaphragm are poorly delineated which may reflect left basilar atelectasis. A left lower lobe pneumonia or left pleural effusion is difficult to exclude The right lung is clear. No evidence of pneumothorax. No acute bony abnormality. Thoracic spondylosis. Overlying cardiac monitoring leads. IMPRESSION: Portions of the left hemidiaphragm are poorly delineated which may reflect left basilar atelectasis. A left lower lobe pneumonia or left pleural effusion is difficult to exclude. Consider a lateral view radiograph for further evaluation. Borderline cardiomegaly, unchanged. Electronically Signed   By: Jackey Loge DO   On: 04/21/2019 19:06     Discharge Exam: Vitals:   05/04/19 1409 05/04/19 1551  BP: 91/62   Pulse:  79  Resp: 17  19  Temp: 97.6 F (36.4 C)   SpO2:  97%   Vitals:   05/04/19 1112 05/04/19 1406 05/04/19 1409 05/04/19 1551  BP: 128/86 (!) 80/54 91/62   Pulse:    79  Resp:  19 17 19   Temp:   97.6 F (36.4 C)   TempSrc:   Oral   SpO2:    97%  Weight:      Height:        General: Pt is alert, awake, not in acute distress Cardiovascular: RRR, S1/S2 +, no rubs, no gallops Respiratory: CTA bilaterally, no wheezing, no rhonchi Abdominal: Soft, NT, ND, bowel sounds + Extremities: no edema, no cyanosis    The results of significant diagnostics from this hospitalization (including imaging, microbiology, ancillary and laboratory) are listed below for reference.     Microbiology: No results found for this or any previous visit (from the past 240 hour(s)).   Labs: BNP (last 3 results) No results for input(s): BNP in the last 8760 hours. Basic Metabolic Panel: Recent Labs  Lab 04/28/19 0313 04/29/19 0431 04/30/19 1140 05/03/19 0333  NA 141 141 141 141  K 4.3 4.1 4.4 4.3  CL 102 102 105 104  CO2 26 28 25 27   GLUCOSE 125* 111* 136* 121*  BUN  29* 29* 26* 35*  CREATININE 0.94 0.87 0.83 0.85  CALCIUM 9.2 9.4 9.1 9.3  MG 2.1 2.2 2.2  --   PHOS 5.0* 5.2* 4.1  --    Liver Function Tests: Recent Labs  Lab 04/28/19 0313 04/29/19 0431 04/30/19 1140  ALBUMIN 2.8* 3.0* 3.1*   No results for input(s): LIPASE, AMYLASE in the last 168 hours. No results for input(s): AMMONIA in the last 168 hours. CBC: No results for input(s): WBC, NEUTROABS, HGB, HCT, MCV, PLT in the last 168 hours. Cardiac Enzymes: No results for input(s): CKTOTAL, CKMB, CKMBINDEX, TROPONINI in the last 168 hours. BNP: Invalid input(s): POCBNP CBG: Recent Labs  Lab 05/03/19 1542 05/03/19 2040 05/03/19 2357 05/04/19 0400 05/04/19 0732  GLUCAP 103* 84 88 75 82   D-Dimer No results for input(s): DDIMER in the last 72 hours. Hgb A1c No results for input(s): HGBA1C in the last 72 hours. Lipid Profile No results  for input(s): CHOL, HDL, LDLCALC, TRIG, CHOLHDL, LDLDIRECT in the last 72 hours. Thyroid function studies No results for input(s): TSH, T4TOTAL, T3FREE, THYROIDAB in the last 72 hours.  Invalid input(s): FREET3 Anemia work up No results for input(s): VITAMINB12, FOLATE, FERRITIN, TIBC, IRON, RETICCTPCT in the last 72 hours. Urinalysis    Component Value Date/Time   COLORURINE AMBER (A) 04/21/2019 1254   APPEARANCEUR TURBID (A) 04/21/2019 1254   LABSPEC 1.014 04/21/2019 1254   LABSPEC 1.015 08/21/2018 1154   PHURINE 5.0 04/21/2019 1254   GLUCOSEU NEGATIVE 04/21/2019 1254   HGBUR LARGE (A) 04/21/2019 1254   BILIRUBINUR NEGATIVE 04/21/2019 1254   BILIRUBINUR negative 08/21/2018 1154   BILIRUBINUR n 06/02/2016 1251   KETONESUR 5 (A) 04/21/2019 1254   PROTEINUR 100 (A) 04/21/2019 1254   UROBILINOGEN negative 06/02/2016 1251   UROBILINOGEN 1.0 02/19/2014 1849   NITRITE NEGATIVE 04/21/2019 1254   LEUKOCYTESUR LARGE (A) 04/21/2019 1254   Sepsis Labs Invalid input(s): PROCALCITONIN,  WBC,  LACTICIDVEN Microbiology No results found for this or any previous visit (from the past 240 hour(s)).   Time coordinating discharge: Over 30 minutes  SIGNED:   Hughie Closs, MD  [facility] 05/04/2019, 5:22 PM  If 7PM-7AM, please contact night-coverage [email or web address]

## 2019-05-04 NOTE — TOC Progression Note (Addendum)
Transition of Care [facility]) - Progression Note    Patient Details  Name: Amy Casey MRN: [id number] Date of Birth: [birth year]  [facility] CM/SW Contact  Patrice [place], [place] Phone Number: [phone] 05/04/2019, 3:27 PM  Clinical Narrative:     CSW was alerted that patient was medically stable for discharge to a [facility] facility. CSW reached out to [place] at [facility] and was notified that they did not have a bed for her as well as they are recommending a geriatric placement. CSW will follow up with geriatric facilities to see if there is a bed available.  3:24pm: CSW read note from psychiatry which stated that patient was no longer meeting the inpatient psychiatry criteria. CSW notified MD that patient had an updated discharge status.        Expected Discharge Plan and Services                                                 Social Determinants of Health (SDOH) Interventions    Readmission Risk Interventions No flowsheet data found.

## 2019-05-04 NOTE — Consult Note (Signed)
[facility] Face-to-Face Psychiatry Consult   Reason for Consult:  Schizophrenia Referring Physician:  Hospitalist Patient Identification: Amy Casey MRN:  [id number] Principal Diagnosis: Schizophrenia (HCC) Diagnosis:  Principal Problem:   Schizophrenia (HCC) Active Problems:   Essential hypertension, benign   Noncompliance   Involuntary commitment   AKI (acute kidney injury) (HCC)   Encephalopathy   Hypokalemia   Positive serology for syphilis   Total Time spent with patient: 45 minutes  Subjective:   Amy Casey is a 59 y.o. female  Who is able to speak softly and states she is here because she wasn't taking her medication and her blood pressure was high at [employer]. She reports being compliant on her psych medications and has not had any difficulties taking her medications since her admission. She does have to repeat herself a few times however due to how soft she is speaking but she is able to articulate well. She does state yes to auditory hallucinations but then states not at this time. She is unable to recall the last time she had hallucinations. She agrees to continue with her psychotropic medications.    Patient is seen by this provider face-to-face today. Patient seen and evaluated by this provider and capacity assessment was completed. Patient also assessed for medication and safety evaluation. SHe is alert and oriented, calm and cooperative on assessment. SHe denies any si/hi/avh at this time. SHe has no concerns about his mood, mental illness, and or her safety.Patient does not appear to be a threat to herself, and remains psychiatrically stable at this point. SHe does appear to have the capacity to understand information relevant to decision making. SHe also understands consequences, complications and results if a poor decision is made. SHe does have the capacity to make decisions regarding home medications, placement, and her care.    HPI:  PER Dr. Selena Batten: 59 y.o. female,  w  hypertension, depression, schizophrenia, presents for high bp,  Pt states that she was taking amlodipine only sporadically per rn notes.  Pt doesn't want to divulge to me why she came.   Pt was seen by ed, and ivc paper work was filled out.  Pt was noted by er to have significant hypokalemia and ed requests observation admission to correct.   Past Psychiatric History: Patient has a history of recurrent psychiatric illness.  Has probably had at least 5 or 6 hospitalizations.  Diagnoses look like they have gone between schizophrenia and depression.  Patient when she is at her baseline evidently functions pretty well at home.  Without her medicine however she has had multiple hospitalizations almost always in the same condition she was in when she came in for this 1.  No known history of suicide attempts.  Multiple medications including antipsychotics and antidepressants have been tried.Last hospitalization 08/15/2018 for 5 days at [facility].  Risk to Self: Suicidal Ideation: (UTA) Suicidal Intent: (UTA) Is patient at risk for suicide?: (UTA) Suicidal Plan?: (UTA) Access to Means: (UTA) What has been your use of drugs/alcohol within the last 12 months?: UTA How many times?: (UTA) Other Self Harm Risks: Pt has been out of her medication since roughly Dec 30 Triggers for Past Attempts: (UTA) Intentional Self Injurious Behavior: (UTA) Risk to Others: Homicidal Ideation: (UTA) Thoughts of Harm to Others: (UTA) Current Homicidal Intent: (UTA) Current Homicidal Plan: (UTA) Access to Homicidal Means: (UTA) Identified Victim: UTA History of harm to others?: (UTA) Assessment of Violence: (UTA) Violent Behavior Description: UTA Does patient have access to weapons?: (UTA) Criminal Charges  Pending?: (UTA) Does patient have a court date: ([place]) Prior Inpatient Therapy: Prior Inpatient Therapy: Yes Prior Therapy Dates: 1998, 2010 - 1-2x/year every year after that Prior Therapy Facilty/Provider(s): [facility] Reason for Treatment: Schizophrenia Prior Outpatient Therapy: Prior Outpatient Therapy: No Does patient have an ACCT team?: No Does patient have Intensive In-House Services?  : No Does patient have [facility] services? : Yes Does patient have P4CC services?: No  Past Medical History:  Past Medical History:  Diagnosis Date  . Depression    Dr. Silvio Pate, [place]; hospitalization 07/2010  . History of echocardiogram 2011   [facility]  . History of renal stone   . Hypertension   . Hypokalemia   . Nephrolithiasis   . Obesity   . Schizophrenia ([place])   . Wears glasses     Past Surgical History:  Procedure Laterality Date  . WISDOM TOOTH EXTRACTION     Family History:  Family History  Problem Relation Age of Onset  . Diabetes Mother   . Heart disease Mother   . Chronic Renal Failure Mother   . Cancer Maternal Uncle        lung  . Kidney disease Mother        dialysis  . Stroke Neg Hx   . Hypertension Neg Hx   . Hyperlipidemia Neg Hx    Family Psychiatric  History: None reported Social History:  Social History   Substance and Sexual Activity  Alcohol Use No     Social History   Substance and Sexual Activity  Drug Use No    Social History   Socioeconomic History  . Marital status: Married    Spouse name: Not on file  . Number of children: 2  . Years of education: 62  . Highest education level: Not on file  Occupational History  . Not on file  Tobacco Use  . Smoking status: Never Smoker  . Smokeless tobacco: Never Used  Substance and Sexual Activity  . Alcohol use: No  . Drug use: No  . Sexual activity: Not Currently  Other Topics Concern  . Not on file  Social History Narrative   ** Merged History Encounter **       Married, 2 children, mother in Sports coach lives with them, was working as a Production designer, theatre/television/film Strain:   . Difficulty of Paying Living Expenses: Not on file  Food Insecurity:   . Worried About Paediatric nurse in the Last Year: Not on file  . Ran Out of Food in the Last Year: Not on file  Transportation Needs:   . Lack of Transportation (Medical): Not on file  . Lack of Transportation (Non-Medical): Not on file  Physical Activity:   . Days of Exercise per Week: Not on file  . Minutes of Exercise per Session: Not on file  Stress:   . Feeling of Stress : Not on file  Social Connections:   . Frequency of Communication with Friends and Family: Not on file  . Frequency of Social Gatherings with Friends and Family: Not on file  . Attends Religious Services: Not on file  . Active Member of Clubs or Organizations: Not on file  . Attends Archivist Meetings: Not on file  . Marital Status: Not on file   Additional Social History:    Allergies:   Allergies  Allergen Reactions  . Paliperidone Other (See Comments)    Angioedema, drooling,  slurred speech, ptosis  . Lisinopril Rash and Other (See Comments)    Angioedema   . Sulfa Antibiotics Itching    Labs:  Results for orders placed or performed during the hospital encounter of 04/15/19 (from the past 48 hour(s))  Glucose, capillary     Status: None   Collection Time: 05/02/19  3:10 PM  Result Value Ref Range   Glucose-Capillary 94 70 - 99 mg/dL   Comment 1 Notify RN    Comment 2 Document in Chart   Glucose, capillary     Status: Abnormal   Collection Time: 05/02/19  7:59 PM  Result Value Ref Range   Glucose-Capillary 121 (H) 70 - 99 mg/dL  Glucose, capillary     Status: Abnormal   Collection Time: 05/03/19 12:38 AM  Result Value Ref Range   Glucose-Capillary 120 (H) 70 - 99 mg/dL  Basic metabolic panel     Status: Abnormal   Collection Time: 05/03/19  3:33 AM  Result Value Ref Range   Sodium 141 135 - 145 mmol/L   Potassium 4.3 3.5 - 5.1 mmol/L   Chloride 104 98 - 111 mmol/L   CO2 27 22 - 32 mmol/L   Glucose, Bld 121 (H) 70 - 99 mg/dL   BUN 35 (H) 6 - 20 mg/dL   Creatinine, Ser 4.00 0.44 - 1.00 mg/dL    Calcium 9.3 8.9 - 86.7 mg/dL   GFR calc non Af Amer >60 >60 mL/min   GFR calc Af Amer >60 >60 mL/min   Anion gap 10 5 - 15    Comment: Performed at [facility], [address], [place], [place] [address]  Glucose, capillary     Status: Abnormal   Collection Time: 05/03/19  5:18 AM  Result Value Ref Range   Glucose-Capillary 114 (H) 70 - 99 mg/dL  Glucose, capillary     Status: None   Collection Time: 05/03/19  7:22 AM  Result Value Ref Range   Glucose-Capillary 94 70 - 99 mg/dL  Glucose, capillary     Status: None   Collection Time: 05/03/19 11:24 AM  Result Value Ref Range   Glucose-Capillary 98 70 - 99 mg/dL  Glucose, capillary     Status: Abnormal   Collection Time: 05/03/19  3:42 PM  Result Value Ref Range   Glucose-Capillary 103 (H) 70 - 99 mg/dL   Comment 1 Notify RN    Comment 2 Document in Chart   Glucose, capillary     Status: None   Collection Time: 05/03/19  8:40 PM  Result Value Ref Range   Glucose-Capillary 84 70 - 99 mg/dL  Glucose, capillary     Status: None   Collection Time: 05/03/19 11:57 PM  Result Value Ref Range   Glucose-Capillary 88 70 - 99 mg/dL  Glucose, capillary     Status: None   Collection Time: 05/04/19  4:00 AM  Result Value Ref Range   Glucose-Capillary 75 70 - 99 mg/dL  Glucose, capillary     Status: None   Collection Time: 05/04/19  7:32 AM  Result Value Ref Range   Glucose-Capillary 82 70 - 99 mg/dL    Current Facility-Administered Medications  Medication Dose Route Frequency Provider Last Rate Last Admin  . acetaminophen (TYLENOL) tablet 650 mg  650 mg Oral Q6H PRN Pearson Grippe, MD   650 mg at 05/03/19 2135   Or  . acetaminophen (TYLENOL) suppository 650 mg  650 mg Rectal Q6H PRN Pearson Grippe, MD      .  amLODipine (NORVASC) tablet 10 mg  10 mg Oral Daily Hughie Closs, MD   10 mg at 05/04/19 1113  . dextrose 50 % solution 25 mL  25 mL Intravenous Q4H PRN Vann, Jessica U, DO      . enoxaparin (LOVENOX) injection 40 mg  40 mg  Subcutaneous Q24H Blanchard Kelch, NP   40 mg at 05/04/19 1110  . feeding supplement (ENSURE ENLIVE) (ENSURE ENLIVE) liquid 237 mL  237 mL Oral BID BM Hughie Closs, MD   237 mL at 05/04/19 1116  . FLUoxetine (PROZAC) capsule 20 mg  20 mg Oral Daily Hughie Closs, MD   20 mg at 05/04/19 1112  . lip balm (CARMEX) ointment   Topical PRN Hughie Closs, MD   1 application at 04/30/19 1440  . metoprolol tartrate (LOPRESSOR) injection 2.5 mg  2.5 mg Intravenous Q6H PRN Candelaria Stagers T, MD   2.5 mg at 04/21/19 1639  . metoprolol tartrate (LOPRESSOR) tablet 25 mg  25 mg Per Tube BID Candelaria Stagers T, MD   25 mg at 05/04/19 1106  . multivitamin with minerals tablet 1 tablet  1 tablet Oral Daily Hughie Closs, MD   1 tablet at 05/04/19 1105  . OLANZapine (ZYPREXA) tablet 10 mg  10 mg Oral BID Hughie Closs, MD   10 mg at 05/04/19 1110  . polyethylene glycol (MIRALAX / GLYCOLAX) packet 17 g  17 g Oral Daily Hughie Closs, MD        Musculoskeletal: Strength & Muscle Tone: decreased Gait & Station: Patient remained in bed during evaluation Patient leans: N/A  Psychiatric Specialty Exam: Physical Exam  Nursing note and vitals reviewed. Constitutional: She is oriented to person, place, and time. She appears well-developed.  Cardiovascular: Normal rate.  Respiratory: Effort normal.  Musculoskeletal:        General: Normal range of motion.  Neurological: She is alert and oriented to person, place, and time.  Skin: Skin is warm.    Review of Systems  HENT: Negative.   Eyes: Negative.   Respiratory: Negative.   Cardiovascular: Negative.   Gastrointestinal: Negative.   Genitourinary: Negative.   Musculoskeletal: Negative.   Psychiatric/Behavioral: Positive for dysphoric mood and hallucinations.    Blood pressure 128/86, pulse 64, temperature 98.2 F (36.8 C), temperature source Oral, resp. rate 11, height 5\' 10"  (1.778 m), weight 78.3 kg, SpO2 100 %.Body mass index is 24.77 kg/m.  General  Appearance: Disheveled  Eye Contact:  Minimal  Speech:  Slow  Volume:  Decreased  Mood:  Depressed  Affect:  Constricted  Thought Process:  Linear and Descriptions of Associations: Intact  Orientation:  Full (Time, Place, and Person)  Thought Content:  Illogical  Suicidal Thoughts:  No  Homicidal Thoughts:  No  Memory:  Immediate;   Poor Recent;   Poor Remote;   Poor  Judgement:  Impaired  Insight:  Shallow  Psychomotor Activity:  Decreased  Concentration:  Concentration: Poor  Recall:  Poor  Fund of Knowledge:  Poor  Language:  Poor  Akathisia:  No  Handed:    AIMS (if indicated):     Assets:  Financial Resources/Insurance Housing Social Support  ADL's:  Impaired  Cognition:  Impaired,  Mild  Sleep:       In brief, 59 year old female, history of Schizophrenia ,Depression who was admitted for not taking her medications. She presented to [facility] and during intake it was determined she was ill and hypertensive. She was also declining foods and medications  due to her paranoia. At this time she is alert and oriented, and appears to be much more improved. She has no evidence of imminent risk to self or others at present.  Psychiatric admission not warranted at this time. SHe has the capacity to make decisions regarding treatment, alternatives, and placement. Will recommend she continue her medications Zyprexa and Prozac. She has been taking these medications for over 2 weeks and has since stabilized in the hospital. Will remove from consult list at this time.   Treatment Plan Summary: Medication management  Continue Zyprexa 20 mg PO QHS and continue encouragment of medications  Continue Prozac 20 mg PO Daily and continued encouragement to take medications   Disposition: No evidence of imminent risk to self or others at present.   Patient does not meet criteria for psychiatric inpatient admission. Supportive therapy provided about ongoing stressors.   Maryagnes Amos,  FNP 05/04/2019 12:08 PM

## 2019-05-04 NOTE — Plan of Care (Signed)
  Problem: Clinical Measurements: Goal: Ability to maintain clinical measurements within normal limits will improve Outcome: Progressing   Problem: Nutrition: Goal: Adequate nutrition will be maintained Outcome: Progressing   

## 2019-05-04 NOTE — Discharge Instructions (Signed)
Dehydration, Adult Dehydration is a condition in which there is not enough water or other fluids in the body. This happens when a person loses more fluids than he or she takes in. Important organs, such as the kidneys, brain, and heart, cannot function without a proper amount of fluids. Any loss of fluids from the body can lead to dehydration. Dehydration can be mild, moderate, or severe. It should be treated right away to prevent it from becoming severe. What are the causes? Dehydration may be caused by:  Conditions that cause loss of water or other fluids, such as diarrhea, vomiting, or sweating or urinating a lot.  Not drinking enough fluids, especially when you are ill or doing activities that require a lot of energy.  Other illnesses and conditions, such as fever or infection.  Certain medicines, such as medicines that remove excess fluid from the body (diuretics).  Lack of safe drinking water.  Not being able to get enough water and food. What increases the risk? The following factors may make you more likely to develop this condition:  Having a long-term (chronic) illness that has not been treated properly, such as diabetes, heart disease, or kidney disease.  Being 65 years of age or older.  Having a disability.  Living in a place that is high in altitude, where thinner, drier air causes more fluid loss.  Doing exercises that put stress on your body for a long time (endurance sports). What are the signs or symptoms? Symptoms of dehydration depend on how severe it is. Mild or moderate dehydration  Thirst.  Dry lips or dry mouth.  Dizziness or light-headedness, especially when standing up from a seated position.  Muscle cramps.  Dark urine. Urine may be the color of tea.  Less urine or tears produced than usual.  Headache. Severe dehydration  Changes in skin. Your skin may be cold and clammy, blotchy, or pale. Your skin also may not return to normal after being  lightly pinched and released.  Little or no tears, urine, or sweat.  Changes in vital signs, such as rapid breathing and low blood pressure. Your pulse may be weak or may be faster than 100 beats a minute when you are sitting still.  Other changes, such as: ? Feeling very thirsty. ? Sunken eyes. ? Cold hands and feet. ? Confusion. ? Being very tired (lethargic) or having trouble waking from sleep. ? Short-term weight loss. ? Loss of consciousness. How is this diagnosed? This condition is diagnosed based on your symptoms and a physical exam. You may have blood and urine tests to help confirm the diagnosis. How is this treated? Treatment for this condition depends on how severe it is. Treatment should be started right away. Do not wait until dehydration becomes severe. Severe dehydration is an emergency and needs to be treated in a hospital.  Mild or moderate dehydration can be treated at home. You may be asked to: ? Drink more fluids. ? Drink an oral rehydration solution (ORS). This drink helps restore proper amounts of fluids and salts and minerals in the blood (electrolytes).  Severe dehydration can be treated: ? With IV fluids. ? By correcting abnormal levels of electrolytes. This is often done by giving electrolytes through a tube that is passed through your nose and into your stomach (nasogastric tube, or NG tube). ? By treating the underlying cause of dehydration. Follow these instructions at home: Oral rehydration solution If told by your health care provider, drink an ORS:  Make   an ORS by following instructions on the package.  Start by drinking small amounts, about  cup (120 mL) every 5-10 minutes.  Slowly increase how much you drink until you have taken the amount recommended by your health care provider. Eating and drinking         Drink enough clear fluid to keep your urine pale yellow. If you were told to drink an ORS, finish the ORS first and then start slowly  drinking other clear fluids. Drink fluids such as: ? Water. Do not drink only water. Doing that can lead to hyponatremia, which is having too little salt (sodium) in the body. ? Water from ice chips you suck on. ? Fruit juice that you have added water to (diluted fruit juice). ? Low-calorie sports drinks.  Eat foods that contain a healthy balance of electrolytes, such as bananas, oranges, potatoes, tomatoes, and spinach.  Do not drink alcohol.  Avoid the following: ? Drinks that contain a lot of sugar. These include high-calorie sports drinks, fruit juice that is not diluted, and soda. ? Caffeine. ? Foods that are greasy or contain a lot of fat or sugar. General instructions  Take over-the-counter and prescription medicines only as told by your health care provider.  Do not take sodium tablets. Doing that can lead to having too much sodium in the body (hypernatremia).  Return to your normal activities as told by your health care provider. Ask your health care provider what activities are safe for you.  Keep all follow-up visits as told by your health care provider. This is important. Contact a health care provider if:  You have muscle cramps, pain, or discomfort, such as: ? Pain in your abdomen and the pain gets worse or stays in one area (localizes). ? Stiff neck.  You have a rash.  You are more irritable than usual.  You are sleepier or have a harder time waking than usual.  You feel weak or dizzy.  You feel very thirsty. Get help right away if you have:  Any symptoms of severe dehydration.  Symptoms of vomiting, such as: ? You cannot eat or drink without vomiting. ? Vomiting gets worse or does not go away. ? Vomit includes blood or green matter (bile).  Symptoms that get worse with treatment.  A fever.  A severe headache.  Problems with urination or bowel movements, such as: ? Diarrhea that gets worse or does not go away. ? Blood in your stool (feces). This  may cause stool to look black and tarry. ? Not urinating, or urinating only a small amount of very dark urine, within 6-8 hours.  Trouble breathing. These symptoms may represent a serious problem that is an emergency. Do not wait to see if the symptoms will go away. Get medical help right away. Call your local emergency services (911 in the [place] Do not drive yourself to the hospital. Summary  Dehydration is a condition in which there is not enough water or other fluids in the body. This happens when a person loses more fluids than he or she takes in.  Treatment for this condition depends on how severe it is. Treatment should be started right away. Do not wait until dehydration becomes severe.  Drink enough clear fluid to keep your urine pale yellow. If you were told to drink an oral rehydration solution (ORS), finish the ORS first and then start slowly drinking other clear fluids.  Take over-the-counter and prescription medicines only as told by your health care   provider.  Get help right away if you have any symptoms of severe dehydration. This information is not intended to replace advice given to you by your health care provider. Make sure you discuss any questions you have with your health care provider. Document Revised: 10/25/2018 Document Reviewed: 10/25/2018 Elsevier Patient Education  2020 [employer]   

## 2019-05-06 ENCOUNTER — Telehealth: Payer: Self-pay

## 2019-05-06 NOTE — Telephone Encounter (Signed)
If her last visit was greater than 1 year ago that I think we need to send a discharge letter.  She has not been coming in for regular visits in general nor has she been coming in for hospital follow-ups.  Thus it may be time to recommend she find another PCP

## 2019-05-06 NOTE — Telephone Encounter (Signed)
This pt. Showed up on my TOC report she was recently in the hospital for Paranoid Schizophrenia, and altered mental status. The follow up recommendations were to f/u 1-2 weeks with PCP, 2-4 weeks with psyc, and to recheck a BMP/CBC in 1 week. Let me know if we need to get her scheduled for a hospital f/u.

## 2019-05-07 ENCOUNTER — Encounter: Payer: Self-pay | Admitting: Family Medicine

## 2019-05-21 ENCOUNTER — Telehealth: Payer: Self-pay | Admitting: Family Medicine

## 2019-05-21 NOTE — Telephone Encounter (Signed)
Pt called wanting to be your patient.  She received the dismissal letter.  I explained to her that she had not had the needed follow ups with you over the last several years and she said she promises she will do what she is supposed to do.  And her husband to.  She wants to know will you re-consider?

## 2019-05-21 NOTE — Telephone Encounter (Signed)
I do not know her.  Your thoughts?

## 2019-05-22 NOTE — Telephone Encounter (Signed)
This is Amy Casey's patient and he is agreeable to take her back. Called pt lmtrc.

## 2019-05-22 NOTE — Telephone Encounter (Signed)
We discussed her in person.   We can give her another chance, but she has to be more compliant with follow up, preventative care measures, and compliant with her mental health care as well per her psychiatrist.

## 2019-05-24 ENCOUNTER — Ambulatory Visit: Payer: BC Managed Care – PPO | Admitting: Medical

## 2019-05-24 ENCOUNTER — Encounter: Payer: Self-pay | Admitting: Medical

## 2019-05-24 ENCOUNTER — Other Ambulatory Visit: Payer: Self-pay

## 2019-05-24 VITALS — BP 160/82 | HR 64 | Temp 96.4°F | Wt 171.8 lb

## 2019-05-24 DIAGNOSIS — Z8619 Personal history of other infectious and parasitic diseases: Secondary | ICD-10-CM | POA: Insufficient documentation

## 2019-05-24 DIAGNOSIS — Z91199 Patient's noncompliance with other medical treatment and regimen due to unspecified reason: Secondary | ICD-10-CM

## 2019-05-24 DIAGNOSIS — Z9119 Patient's noncompliance with other medical treatment and regimen: Secondary | ICD-10-CM

## 2019-05-24 DIAGNOSIS — I10 Essential (primary) hypertension: Secondary | ICD-10-CM

## 2019-05-24 DIAGNOSIS — F200 Paranoid schizophrenia: Secondary | ICD-10-CM | POA: Diagnosis not present

## 2019-05-24 DIAGNOSIS — R350 Frequency of micturition: Secondary | ICD-10-CM

## 2019-05-24 DIAGNOSIS — R7301 Impaired fasting glucose: Secondary | ICD-10-CM

## 2019-05-24 LAB — POCT URINALYSIS DIP (PROADVANTAGE DEVICE)
Bilirubin, UA: NEGATIVE
Glucose, UA: NEGATIVE mg/dL
Ketones, POC UA: NEGATIVE mg/dL
Nitrite, UA: NEGATIVE
Protein Ur, POC: NEGATIVE mg/dL
Specific Gravity, Urine: 1.015
Urobilinogen, Ur: 0.2
pH, UA: 8 (ref 5.0–8.0)

## 2019-05-24 MED ORDER — MIRABEGRON ER 25 MG PO TB24: 25.0000 mg | ORAL_TABLET | Freq: Every day | ORAL | 0 refills | Status: DC

## 2019-05-24 MED ORDER — AMLODIPINE BESYLATE 10 MG PO TABS: 10.0000 mg | ORAL_TABLET | Freq: Every day | ORAL | 2 refills | Status: DC

## 2019-05-24 MED ORDER — CIPROFLOXACIN HCL 500 MG PO TABS: 500.0000 mg | ORAL_TABLET | Freq: Two times a day (BID) | ORAL | 0 refills | Status: DC

## 2019-05-24 NOTE — Patient Instructions (Addendum)
Your urinalysis was abnormal today.   We suspect possibly ongoing or new urinary tract infection.     Begin Cipro antibiotic twice daily for  5 days.   Hydrate well with water the next several days.  Add some lemon juice in the water this next few days  We are checking /repeating your electrolytes, kidney and blood counts per hospitalization recommendations as your kidney had injury with this past visit to the hospital due dehydration, infection, and blood pressure being too high.   Your blood pressure today is too high. You stated that you have been doing Amlodipine 5mg  daily.     increase to Amlodipine 10mg  daily for blood pressure  Avoid or limit salt intake  Eat plenty of vegetables, fruits, while grains, and cut back on meat/animal products   You have symptoms suggestive of overactive bladder, as you note for years.  Begin samples of Myrbetriq 25mg  once daily in the morning.   Call back in 2 weeks to let me know if this is helping.    Follow up with [facility] about your other medications   Have your husband come in for evaluation or physical.    Plan to recheck in 2 months for a physical fasting and repeat labs.

## 2019-05-24 NOTE — Progress Notes (Signed)
Subjective:  Amy Casey is a 59 y.o. female who presents for Chief Complaint  Patient presents with  . other    urine freq. started about six months ago     Here for urine concern.   Here alone today.  Last visit over a year ago, hx/o schixophrenia, depressoin, HTN, noncompliance.    Of note, has had recent hospitalizations in January 2021, 10/2018, 07/2018, all related to schizophrenia.    Here today for urinary frequency.  She notes having UTI with last hospitalizatoin.   Goes back to work next week. 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  She notes long standing problems having to run to the bathroom with urge.   Has been tyring to drink more water.  No burning, no odor.  No blood in urine.  Having incotnnience with urge to go but no incoitnence with laughign or coughing.  Urinating probably 10-12 times per day.   No contapition, BMs are fine.   No vaginal discharge.  Not drinking a lot of caffein.  No bulge in vagina.  No other aggravating or relieving factors.    No other c/o.  The following portions of the patient's history were reviewed and updated as appropriate: allergies, current medications, past family history, past medical history, past social history, past surgical history and problem list.  ROS Otherwise as in subjective above  Past Medical History:  Diagnosis Date  . Depression    Dr. Silvio Pate, [place]; hospitalization 07/2010  . History of echocardiogram 2011   [facility]  . History of renal stone   . Hypertension   . Hypokalemia   . Nephrolithiasis   . Obesity   . Schizophrenia ([place])   . Wears glasses    Current Outpatient Medications on File Prior to Visit  Medication Sig Dispense Refill  . feeding supplement, ENSURE ENLIVE, (ENSURE ENLIVE) LIQD Take 237 mLs by mouth 2 (two) times daily between meals. 14220 mL 0  . FLUoxetine (PROZAC) 20 MG capsule Take 1 capsule (20 mg total) by mouth daily. 30 capsule 0  . ibuprofen (ADVIL) 200 MG tablet Take  200-400 mg by mouth every 6 (six) hours as needed for headache (pain).    Marland Kitchen OLANZapine (ZYPREXA) 20 MG tablet Take 1 tablet (20 mg total) by mouth at bedtime. 30 tablet 1  . docusate sodium (COLACE) 100 MG capsule Take 2 capsules (200 mg total) by mouth daily. (Patient not taking: Reported on 04/15/2019) 60 capsule 1   No current facility-administered medications on file prior to visit.    Objective: BP (!) 160/82 (BP Location: Left Arm, Patient Position: Sitting)   Pulse 64   Temp (!) 96.4 F (35.8 C)   Wt 171 lb 12.8 oz (77.9 kg)   SpO2 98%   BMI 24.65 kg/m   General appearance: alert, no distress, well developed, well nourished Neck: supple, no lymphadenopathy, no thyromegaly, no masses Heart: RRR, normal S1, S2, no murmurs Lungs: CTA bilaterally, no wheezes, rhonchi, or rales Abdomen: +bs, soft, non tender, non distended, no masses, no hepatomegaly, no splenomegaly Pulses: 2+ radial pulses, 2+ pedal pulses, normal cap refill Ext: no edema    Assessment: Encounter Diagnoses  Name Primary?  . Urinary frequency Yes  . Essential hypertension   . Impaired fasting blood sugar   . Paranoid schizophrenia ([place])   . Noncompliance   . H/O positive serological reaction for syphilis      Plan: I reviewed her January 2021 hospitalization.  Reviewed  medications, reconciled medications, reviewed labs, records from [facility].  This hospitalization included sepsis presumably from urinary tract infection, noncompliance on treatment, uncontrolled high blood pressure, impaired glucose, acute kidney injury, and a concern for positive syphilis test.  She was treated with Bicillin x1.  We discussed her symptoms and concerns today.  We will go ahead and treat again for urinary tract infection given urinalysis.  We will send urine for culture.  I advised her husband come in for syphilis testing.  Advise she have a repeat syphilis test next visit  We discussed the other  recommendations as below  Patient Instructions  Your urinalysis was abnormal today.   We suspect possibly ongoing or new urinary tract infection.     Begin Cipro antibiotic twice daily for  5 days.   Hydrate well with water the next several days.  Add some lemon juice in the water this next few days  We are checking /repeating your electrolytes, kidney and blood counts per hospitalization recommendations as your kidney had injury with this past visit to the hospital due dehydration, infection, and blood pressure being too high.   Your blood pressure today is too high. You stated that you have been doing Amlodipine 5mg  daily.     increase to Amlodipine 10mg  daily for blood pressure  Avoid or limit salt intake  Eat plenty of vegetables, fruits, while grains, and cut back on meat/animal products   You have symptoms suggestive of overactive bladder, as you note for years.  Begin samples of Myrbetriq 25mg  once daily in the morning.   Call back in 2 weeks to let me know if this is helping.    Follow up with [facility] about your other medications   Have your husband come in for evaluation or physical.    Plan to recheck in 2 months for a physical fasting and repeat labs.         [name] was seen today for other.  Diagnoses and all orders for this visit:  Urinary frequency -     POCT Urinalysis DIP (Proadvantage Device) -     Urine Culture -     GC/Chlamydia Probe Amp -     CBC  Essential hypertension -     Basic metabolic panel -     CBC  Impaired fasting blood sugar -     Basic metabolic panel -     CBC  Paranoid schizophrenia ([facility])  Noncompliance  H/O positive serological reaction for syphilis -     GC/Chlamydia Probe Amp  Other orders -     ciprofloxacin (CIPRO) 500 MG tablet; Take 1 tablet (500 mg total) by mouth 2 (two) times daily. -     amLODipine (NORVASC) 10 MG tablet; Take 1 tablet (10 mg total) by mouth daily. -     mirabegron ER (MYRBETRIQ) 25 MG  TB24 tablet; Take 1 tablet (25 mg total) by mouth daily.    Follow up: 4mo

## 2019-05-25 LAB — CBC
Hematocrit: 33.3 % — ABNORMAL LOW (ref 34.0–46.6)
Hemoglobin: 11.3 g/dL (ref 11.1–15.9)
MCH: 30.1 pg (ref 26.6–33.0)
MCHC: 33.9 g/dL (ref 31.5–35.7)
MCV: 89 fL (ref 79–97)
Platelets: 336 10*3/uL (ref 150–450)
RBC: 3.75 x10E6/uL — ABNORMAL LOW (ref 3.77–5.28)
RDW: 13 % (ref 11.7–15.4)
WBC: 6.5 10*3/uL (ref 3.4–10.8)

## 2019-05-25 LAB — BASIC METABOLIC PANEL
BUN/Creatinine Ratio: 18 (ref 12–28)
BUN: 14 mg/dL (ref 8–27)
CO2: 25 mmol/L (ref 20–29)
Calcium: 9.3 mg/dL (ref 8.7–10.3)
Chloride: 105 mmol/L (ref 96–106)
Creatinine, Ser: 0.76 mg/dL (ref 0.57–1.00)
GFR calc Af Amer: 99 mL/min/{1.73_m2} (ref >59)
GFR calc non Af Amer: 86 mL/min/{1.73_m2} (ref >59)
Glucose: 98 mg/dL (ref 65–99)
Potassium: 3.8 mmol/L (ref 3.5–5.2)
Sodium: 143 mmol/L (ref 134–144)

## 2019-05-26 LAB — GC/CHLAMYDIA PROBE AMP
Chlamydia trachomatis, NAA: NEGATIVE
Neisseria Gonorrhoeae by PCR: NEGATIVE

## 2019-05-27 LAB — URINE CULTURE

## 2019-06-05 DIAGNOSIS — F4310 Post-traumatic stress disorder, unspecified: Secondary | ICD-10-CM | POA: Diagnosis not present

## 2019-06-13 ENCOUNTER — Telehealth: Payer: Self-pay

## 2019-06-13 ENCOUNTER — Other Ambulatory Visit: Payer: Self-pay | Admitting: Medical

## 2019-06-13 MED ORDER — SOLIFENACIN SUCCINATE 5 MG PO TABS: 5.0000 mg | ORAL_TABLET | Freq: Every day | ORAL | 2 refills | Status: DC

## 2019-06-13 NOTE — Telephone Encounter (Signed)
Pt. Called stating she wanted to let you know that the samples of medicine you gave her for her issue with going to the bathroom to much is working, Myrbetriq she thought that's what it is called she will need some more of it soon, she said if you have some more samples let her know, or if you could call her in some if it or the generic for it so it could be cheaper.

## 2019-06-13 NOTE — Telephone Encounter (Signed)
If that is too expensive as there is not a generic, I sent one called Vesicare which is similar but may be cheaper.  Let me know.   Also, if Vesic are is expensive, then we can see if we have coupon cards for either medication.

## 2019-06-14 NOTE — Telephone Encounter (Signed)
Called pt. Amy Casey stating new medication was called in and to let us know if it's too expensive.

## 2019-07-22 ENCOUNTER — Other Ambulatory Visit (HOSPITAL_COMMUNITY)
Admission: RE | Admit: 2019-07-22 | Discharge: 2019-07-22 | Disposition: A | Discharge status: Home / Self Care | Payer: BC Managed Care – PPO | Source: Ambulatory Visit | Attending: Medical | Admitting: Medical

## 2019-07-22 ENCOUNTER — Other Ambulatory Visit: Payer: Self-pay

## 2019-07-22 ENCOUNTER — Ambulatory Visit: Payer: BC Managed Care – PPO | Admitting: Medical

## 2019-07-22 ENCOUNTER — Encounter: Payer: Self-pay | Admitting: Medical

## 2019-07-22 VITALS — BP 158/78 | HR 60 | Temp 98.0°F | Ht 70.0 in | Wt 177.6 lb

## 2019-07-22 DIAGNOSIS — A530 Latent syphilis, unspecified as early or late: Secondary | ICD-10-CM | POA: Diagnosis not present

## 2019-07-22 DIAGNOSIS — N3281 Overactive bladder: Secondary | ICD-10-CM | POA: Insufficient documentation

## 2019-07-22 DIAGNOSIS — I10 Essential (primary) hypertension: Secondary | ICD-10-CM | POA: Diagnosis not present

## 2019-07-22 DIAGNOSIS — Z124 Encounter for screening for malignant neoplasm of cervix: Secondary | ICD-10-CM | POA: Insufficient documentation

## 2019-07-22 DIAGNOSIS — Z8619 Personal history of other infectious and parasitic diseases: Secondary | ICD-10-CM | POA: Diagnosis not present

## 2019-07-22 DIAGNOSIS — Z129 Encounter for screening for malignant neoplasm, site unspecified: Secondary | ICD-10-CM

## 2019-07-22 DIAGNOSIS — Z7185 Encounter for immunization safety counseling: Secondary | ICD-10-CM | POA: Insufficient documentation

## 2019-07-22 DIAGNOSIS — Z86018 Personal history of other benign neoplasm: Secondary | ICD-10-CM | POA: Insufficient documentation

## 2019-07-22 DIAGNOSIS — Z0000 Encounter for general adult medical examination without abnormal findings: Secondary | ICD-10-CM | POA: Diagnosis not present

## 2019-07-22 DIAGNOSIS — F200 Paranoid schizophrenia: Secondary | ICD-10-CM

## 2019-07-22 DIAGNOSIS — Z1211 Encounter for screening for malignant neoplasm of colon: Secondary | ICD-10-CM

## 2019-07-22 DIAGNOSIS — L989 Disorder of the skin and subcutaneous tissue, unspecified: Secondary | ICD-10-CM | POA: Insufficient documentation

## 2019-07-22 DIAGNOSIS — Z79899 Other long term (current) drug therapy: Secondary | ICD-10-CM

## 2019-07-22 DIAGNOSIS — N390 Urinary tract infection, site not specified: Secondary | ICD-10-CM

## 2019-07-22 DIAGNOSIS — Z7189 Other specified counseling: Secondary | ICD-10-CM

## 2019-07-22 LAB — POCT URINALYSIS DIP (PROADVANTAGE DEVICE)
Bilirubin, UA: NEGATIVE
Glucose, UA: NEGATIVE mg/dL
Ketones, POC UA: NEGATIVE mg/dL
Nitrite, UA: NEGATIVE
Protein Ur, POC: NEGATIVE mg/dL
Specific Gravity, Urine: 1.025
Urobilinogen, Ur: NEGATIVE
pH, UA: 6 (ref 5.0–8.0)

## 2019-07-22 NOTE — Patient Instructions (Addendum)
Recommendations:  Shingles vaccine:  I recommend you have a shingles vaccine to help prevent shingles or herpes zoster outbreak.   Please call your insurer to inquire about coverage for the Shingrix vaccine given in 2 doses.   Some insurers cover this vaccine after age 61, some cover this after age 13.  If your insurer covers this, then call to schedule appointment to have this vaccine here.    Please call to schedule your mammogram  [facility]  [phone], [address] [place], [place] [address]   We will call with lab results    See your dentist yearly for routine dental care including hygiene visits twice yearly.  See your eye doctor yearly for routine vision care.    We will refer to dermatology for skin lesions   We will refer for cologard for colon cancer screening
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
## 2019-07-23 ENCOUNTER — Other Ambulatory Visit: Payer: Self-pay | Admitting: Medical

## 2019-07-23 LAB — RPR: RPR Ser Ql: NONREACTIVE

## 2019-07-23 LAB — LIPID PANEL
Chol/HDL Ratio: 2.5 ratio (ref 0.0–4.4)
Cholesterol, Total: 172 mg/dL (ref 100–199)
HDL: 69 mg/dL (ref >39)
LDL Chol Calc (NIH): 91 mg/dL (ref 0–99)
Triglycerides: 61 mg/dL (ref 0–149)
VLDL Cholesterol Cal: 12 mg/dL (ref 5–40)

## 2019-07-23 MED ORDER — AMLODIPINE BESYLATE 10 MG PO TABS: 10.0000 mg | ORAL_TABLET | Freq: Every day | ORAL | 3 refills | Status: DC

## 2019-07-23 MED ORDER — SOLIFENACIN SUCCINATE 5 MG PO TABS: 5.0000 mg | ORAL_TABLET | Freq: Every day | ORAL | 3 refills | Status: AC

## 2019-07-23 MED ORDER — ROSUVASTATIN CALCIUM 10 MG PO TABS: 10.0000 mg | ORAL_TABLET | Freq: Every day | ORAL | 3 refills | Status: DC

## 2019-07-24 LAB — CYTOLOGY - PAP
Comment: NEGATIVE
Diagnosis: NEGATIVE
High risk HPV: NEGATIVE

## 2019-07-29 ENCOUNTER — Telehealth: Payer: Self-pay | Admitting: Medical

## 2019-07-29 NOTE — Telephone Encounter (Signed)
Pt called and wanted to see if there was anything that could be done to get rid of the positive syphilis result that was done in the hospital. The one she had here was negative so she does not want the positive one shown on her chart

## 2019-07-29 NOTE — Telephone Encounter (Signed)
I don't know of a way to remove a lab.   As I mentioned in the recent lab note, given her recent "negative" result, this suggests that the test 3 months ago was a false positive result.   This can happen with any lab, but fortunately in her case in means normal/negative for syphilis.  I can certainly discuss with her husband if he wants to talk about this.  (if she is adamant about removing the lab, then send to Lafonda Mosses in case we can lobby IT department to see if they have a way to remove the lab).   Vincenza Hews

## 2019-07-30 NOTE — Telephone Encounter (Signed)
Called and left pt a Vm to call the office so I could give her this information

## 2019-12-15 ENCOUNTER — Ambulatory Visit (HOSPITAL_COMMUNITY)
Admission: EM | Admit: 2019-12-15 | Discharge: 2019-12-15 | Disposition: A | Discharge status: Home / Self Care | Payer: BC Managed Care – PPO | Attending: Family Medicine | Admitting: Family Medicine

## 2019-12-15 ENCOUNTER — Other Ambulatory Visit: Payer: Self-pay

## 2019-12-15 DIAGNOSIS — Z20822 Contact with and (suspected) exposure to covid-19: Secondary | ICD-10-CM | POA: Diagnosis not present

## 2019-12-15 NOTE — ED Triage Notes (Signed)
Pt came in to be tested for covid 19, pt denies any symptoms at this time.

## 2019-12-16 LAB — SARS CORONAVIRUS 2 (TAT 6-24 HRS): SARS Coronavirus 2: NEGATIVE

## 2020-01-01 ENCOUNTER — Encounter (HOSPITAL_COMMUNITY): Payer: Self-pay

## 2020-01-01 ENCOUNTER — Other Ambulatory Visit: Payer: Self-pay

## 2020-01-01 ENCOUNTER — Emergency Department (HOSPITAL_COMMUNITY)
Admission: EM | Admit: 2020-01-01 | Discharge: 2020-01-04 | Disposition: A | Discharge status: Other Acute Inpatient Hospital | Payer: BC Managed Care – PPO | Attending: Emergency Medicine | Admitting: Emergency Medicine

## 2020-01-01 DIAGNOSIS — Z046 Encounter for general psychiatric examination, requested by authority: Secondary | ICD-10-CM | POA: Diagnosis not present

## 2020-01-01 DIAGNOSIS — F209 Schizophrenia, unspecified: Secondary | ICD-10-CM | POA: Diagnosis not present

## 2020-01-01 DIAGNOSIS — Z20822 Contact with and (suspected) exposure to covid-19: Secondary | ICD-10-CM | POA: Diagnosis not present

## 2020-01-01 DIAGNOSIS — E876 Hypokalemia: Secondary | ICD-10-CM | POA: Diagnosis not present

## 2020-01-01 DIAGNOSIS — I10 Essential (primary) hypertension: Secondary | ICD-10-CM | POA: Diagnosis not present

## 2020-01-01 DIAGNOSIS — F99 Mental disorder, not otherwise specified: Secondary | ICD-10-CM | POA: Diagnosis not present

## 2020-01-01 DIAGNOSIS — Z79899 Other long term (current) drug therapy: Secondary | ICD-10-CM | POA: Insufficient documentation

## 2020-01-01 DIAGNOSIS — Z008 Encounter for other general examination: Secondary | ICD-10-CM

## 2020-01-01 DIAGNOSIS — F29 Unspecified psychosis not due to a substance or known physiological condition: Secondary | ICD-10-CM | POA: Diagnosis not present

## 2020-01-01 DIAGNOSIS — F23 Brief psychotic disorder: Secondary | ICD-10-CM

## 2020-01-01 LAB — COMPREHENSIVE METABOLIC PANEL
ALT: 32 U/L (ref 0–44)
AST: 32 U/L (ref 15–41)
Albumin: 4.2 g/dL (ref 3.5–5.0)
Alkaline Phosphatase: 64 U/L (ref 38–126)
Anion gap: 15 (ref 5–15)
BUN: 29 mg/dL — ABNORMAL HIGH (ref 6–20)
CO2: 26 mmol/L (ref 22–32)
Calcium: 9.2 mg/dL (ref 8.9–10.3)
Chloride: 104 mmol/L (ref 98–111)
Creatinine, Ser: 1.05 mg/dL — ABNORMAL HIGH (ref 0.44–1.00)
GFR calc non Af Amer: 58 mL/min — ABNORMAL LOW (ref >60)
Glucose, Bld: 97 mg/dL (ref 70–99)
Potassium: 2.9 mmol/L — ABNORMAL LOW (ref 3.5–5.1)
Sodium: 145 mmol/L (ref 135–145)
Total Bilirubin: 0.7 mg/dL (ref 0.3–1.2)
Total Protein: 7.6 g/dL (ref 6.5–8.1)

## 2020-01-01 LAB — ACETAMINOPHEN LEVEL: Acetaminophen (Tylenol), Serum: <10 ug/mL — ABNORMAL LOW (ref 10–30)

## 2020-01-01 LAB — CBC
HCT: 43.8 % (ref 36.0–46.0)
Hemoglobin: 14.4 g/dL (ref 12.0–15.0)
MCH: 29.1 pg (ref 26.0–34.0)
MCHC: 32.9 g/dL (ref 30.0–36.0)
MCV: 88.7 fL (ref 80.0–100.0)
Platelets: 335 10*3/uL (ref 150–400)
RBC: 4.94 MIL/uL (ref 3.87–5.11)
RDW: 12.2 % (ref 11.5–15.5)
WBC: 9.5 10*3/uL (ref 4.0–10.5)
nRBC: 0 % (ref 0.0–0.2)

## 2020-01-01 LAB — RESPIRATORY PANEL BY RT PCR (FLU A&B, COVID)
Influenza A by PCR: NEGATIVE
Influenza B by PCR: NEGATIVE
SARS Coronavirus 2 by RT PCR: NEGATIVE

## 2020-01-01 LAB — SALICYLATE LEVEL: Salicylate Lvl: <7 mg/dL — ABNORMAL LOW (ref 7.0–30.0)

## 2020-01-01 LAB — ETHANOL: Alcohol, Ethyl (B): <10 mg/dL (ref <10)

## 2020-01-01 MED ORDER — POTASSIUM CHLORIDE CRYS ER 20 MEQ PO TBCR
40.0000 meq | EXTENDED_RELEASE_TABLET | Freq: Once | ORAL | Status: AC
  Administered 2020-01-01: 40 meq via ORAL
  Filled 2020-01-01: qty 2

## 2020-01-01 MED ORDER — ROSUVASTATIN CALCIUM 5 MG PO TABS
10.0000 mg | ORAL_TABLET | Freq: Every day | ORAL | Status: DC
  Administered 2020-01-02 – 2020-01-03 (×2): 10 mg via ORAL
  Filled 2020-01-01 (×3): qty 2

## 2020-01-01 MED ORDER — FLUOXETINE HCL 20 MG PO CAPS
20.0000 mg | ORAL_CAPSULE | Freq: Every day | ORAL | Status: DC
  Administered 2020-01-02 – 2020-01-03 (×2): 20 mg via ORAL
  Filled 2020-01-01 (×2): qty 1

## 2020-01-01 MED ORDER — POTASSIUM CHLORIDE CRYS ER 20 MEQ PO TBCR: 40.0000 meq | EXTENDED_RELEASE_TABLET | Freq: Once | ORAL | Status: DC

## 2020-01-01 MED ORDER — DARIFENACIN HYDROBROMIDE ER 7.5 MG PO TB24
7.5000 mg | ORAL_TABLET | Freq: Every day | ORAL | Status: DC
  Filled 2020-01-01 (×3): qty 1

## 2020-01-01 MED ORDER — AMLODIPINE BESYLATE 5 MG PO TABS
10.0000 mg | ORAL_TABLET | Freq: Every day | ORAL | Status: DC
  Administered 2020-01-02: 10 mg via ORAL
  Filled 2020-01-01 (×3): qty 2

## 2020-01-01 MED ORDER — POTASSIUM CHLORIDE CRYS ER 20 MEQ PO TBCR
10.0000 meq | EXTENDED_RELEASE_TABLET | Freq: Every day | ORAL | Status: DC
  Administered 2020-01-02: 10 meq via ORAL
  Filled 2020-01-01 (×3): qty 1

## 2020-01-01 NOTE — ED Provider Notes (Signed)
[facility] EMERGENCY DEPARTMENT Provider Note   CSN: [id number] Arrival date & time: 01/01/20  1604     History Chief Complaint  Patient presents with  . Psychiatric Evaluation    Amy Casey is a 59 y.o. female.  HPI   59 year old female with history of depression, nephrolithiasis, hypertension, hypokalemia, obesity, schizophrenia, who presents the emergency department today for psychiatric evaluation.  Patient significant other is at bedside and he states the patient has not been eating or drinking for the last 6 to 7 days.  He states he is concerned that she is getting dehydrated and that her schizophrenia is worse.  She has done this several times in the past and has required admission for psychiatric care.  He states that she has not consistently been taking her medications though she did take them today.  He states she has been talking to herself and seems like she is hallucinating.  The patient denies any SI, HI.  She continues to mumble to herself but denies any AVH.  She denies any medical complaints on my evaluation.  Past Medical History:  Diagnosis Date  . Depression    Dr. Mila Homer, [place]; hospitalization 07/2010  . History of echocardiogram 2011   [facility]  . History of renal stone   . Hypertension   . Hypokalemia   . Nephrolithiasis   . Obesity   . Schizophrenia (HCC)   . Wears glasses     Patient Active Problem List   Diagnosis Date Noted  . Overactive bladder 07/22/2019  . Vaccine counseling 07/22/2019  . History of changing skin mole 07/22/2019  . Screening for cervical cancer 07/22/2019  . Skin lesion 07/22/2019  . H/O positive serological reaction for syphilis 05/24/2019  . 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 Essential hypertension 11/06/2018  . Encounter for health maintenance examination in adult 08/21/2018  . 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 Physical deconditioning 08/09/2018  .  Encephalopathy 07/31/2018  . Acute metabolic encephalopathy 07/30/2018  . UTI (urinary tract infection) 07/19/2017  . High risk medication use 07/25/2016  . Involuntary commitment   . Paranoid schizophrenia (HCC) 06/16/2015  . Noncompliance 06/16/2015  . Tooth decay 02/10/2012    Past Surgical History:  Procedure Laterality Date  . LUMBAR PUNCTURE  2015  . WISDOM TOOTH EXTRACTION       OB History   No obstetric history on file.     Family History  Problem Relation Age of Onset  . Diabetes Mother   . Heart disease Mother   . Chronic Renal Failure Mother   . Cancer Maternal Uncle        lung  . Kidney disease Mother        dialysis  . Stroke Neg Hx   . Hypertension Neg Hx   . Hyperlipidemia Neg Hx     Social History   Tobacco Use  . Smoking status: Never Smoker  . Smokeless tobacco: Never Used  Vaping Use  . Vaping Use: Never used  Substance Use Topics  . Alcohol use: No  . Drug use: No    Home Medications Prior to Admission medications   Medication Sig Start Date End Date Taking? Authorizing Provider  amLODipine (NORVASC) 10 MG tablet Take 1 tablet (10 mg total) by mouth daily. 07/23/19   Tysinger, Kermit Balo, PA-C  FLUoxetine (PROZAC) 20 MG capsule Take 1 capsule (20 mg total) by mouth daily. 05/05/19 06/04/19  Hughie Closs, MD  OLANZapine (  ZYPREXA) 20 MG tablet Take 1 tablet (20 mg total) by mouth at bedtime. 08/20/18   Clapacs, Jackquline Denmark, MD  rosuvastatin (CRESTOR) 10 MG tablet Take 1 tablet (10 mg total) by mouth daily. 07/23/19 07/22/20  Tysinger, Kermit Balo, PA-C  solifenacin (VESICARE) 5 MG tablet Take 1 tablet (5 mg total) by mouth daily. 07/23/19 07/22/20  Tysinger, Kermit Balo, PA-C    Allergies    Paliperidone, Lisinopril, and Sulfa antibiotics  Review of Systems   Review of Systems  Constitutional: Negative for fever.  HENT: Negative for ear pain and sore throat.   Eyes: Negative for visual disturbance.  Respiratory: Negative for cough and shortness of breath.     Cardiovascular: Negative for chest pain.  Gastrointestinal: Negative for abdominal pain, constipation, diarrhea, nausea and vomiting.  Genitourinary: Negative for dysuria and hematuria.  Musculoskeletal: Negative for back pain.  Skin: Negative for rash.  Neurological: Negative for headaches.  Psychiatric/Behavioral:       Denies si, hi or avh  All other systems reviewed and are negative.   Physical Exam Updated Vital Signs BP (!) 151/85 (BP Location: Left Arm)   Pulse 63   Temp 98.2 F (36.8 C) (Oral)   Resp 16   SpO2 99%   Physical Exam Vitals and nursing note reviewed.  Constitutional:      General: She is not in acute distress.    Appearance: She is well-developed.  HENT:     Head: Normocephalic and atraumatic.  Eyes:     Conjunctiva/sclera: Conjunctivae normal.  Cardiovascular:     Rate and Rhythm: Normal rate and regular rhythm.     Heart sounds: No murmur heard.   Pulmonary:     Effort: Pulmonary effort is normal. No respiratory distress.     Breath sounds: Normal breath sounds.  Abdominal:     Palpations: Abdomen is soft.     Tenderness: There is no abdominal tenderness.  Musculoskeletal:     Cervical back: Neck supple.  Skin:    General: Skin is warm and dry.  Neurological:     Mental Status: She is alert.  Psychiatric:        Mood and Affect: Affect is blunt and flat.        Speech: She is noncommunicative.        Thought Content: Thought content does not include homicidal or suicidal ideation. Thought content does not include homicidal or suicidal plan.     Comments: Mumbling to self     ED Results / Procedures / Treatments   Labs (all labs ordered are listed, but only abnormal results are displayed) Labs Reviewed  COMPREHENSIVE METABOLIC PANEL - Abnormal; Notable for the following components:      Result Value   Potassium 2.9 (*)    BUN 29 (*)    Creatinine, Ser 1.05 (*)    GFR calc non Af Amer 58 (*)    All other components within normal  limits  SALICYLATE LEVEL - Abnormal; Notable for the following components:   Salicylate Lvl <7.0 (*)    All other components within normal limits  ACETAMINOPHEN LEVEL - Abnormal; Notable for the following components:   Acetaminophen (Tylenol), Serum <10 (*)    All other components within normal limits  RESPIRATORY PANEL BY RT PCR (FLU A&B, COVID)  ETHANOL  CBC  RAPID URINE DRUG SCREEN, HOSP PERFORMED    EKG EKG Interpretation  Date/Time:  Wednesday January 01 2020 18:46:05 EDT Ventricular Rate:  70 PR Interval:  140 QRS Duration: 92 QT Interval:  412 QTC Calculation: 444 R Axis:   -5 Text Interpretation: Normal sinus rhythm with sinus arrhythmia Moderate voltage criteria for LVH, may be normal variant ( R in aVL , Cornell product ) Nonspecific ST abnormality Abnormal ECG Confirmed by Tilden Fossa [phone]) on 01/01/2020 8:04:04 PM   Radiology No results found.  Procedures Procedures (including critical care time)  Medications Ordered in ED Medications  amLODipine (NORVASC) tablet 10 mg (has no administration in time range)  FLUoxetine (PROZAC) capsule 20 mg (has no administration in time range)  rosuvastatin (CRESTOR) tablet 10 mg (has no administration in time range)  darifenacin (ENABLEX) 24 hr tablet 7.5 mg (has no administration in time range)  potassium chloride SA (KLOR-CON) CR tablet 10 mEq (has no administration in time range)  potassium chloride SA (KLOR-CON) CR tablet 40 mEq (40 mEq Oral Given 01/01/20 1902)    ED Course  I have reviewed the triage vital signs and the nursing notes.  Pertinent labs & imaging results that were available during my care of the patient were reviewed by me and considered in my medical decision making (see chart for details).    MDM Rules/Calculators/A&P                          59 year old female with history of schizophrenia, presenting the emergency department today for psych evaluation.  Husband states patient not eating or  drinking for the last several days.  Intermittently taking medication.  Reviewed/interpreted labs CBC unremarkable CMP with mild hypokalemia, slightly elevated BUN/creatinine, otherwise reassuring EtOH, acetaminophen, salicylate levels are negative Covid negative UDS pending  Patient was given p.o. potassium in the emergency department.  She was also able to eat some cheese and tolerate some orange juice with encouragement.  Pt does not appear to have an acute medical condition that would require further workup or admission.   At shift change, pt pending psych recommendations. Care transitioned to default provider.  The patient has been placed in psychiatric observation due to the need to provide a safe environment for the patient while obtaining psychiatric consultation and evaluation, as well as ongoing medical and medication management to treat the patient's condition.  The patient has not been placed under full IVC at this time.   Final Clinical Impression(s) / ED Diagnoses Final diagnoses:  Encounter for psychological evaluation  Hypokalemia    Rx / DC Orders ED Discharge Orders    None       Rayne Du 01/01/20 2357    Tilden Fossa, MD 01/02/20 1436

## 2020-01-01 NOTE — BH Assessment (Addendum)
Comprehensive Clinical Assessment (CCA) Note  01/01/2020 Amy Casey [id number]  Per EDP, " 59 year old female with history of depression, nephrolithiasis, hypertension, hypokalemia, obesity, schizophrenia, who presents the emergency department today for psychiatric evaluation.  Patient significant other is at bedside and he states the patient has not been eating or drinking for the last 6 to 7 days.  He states he is concerned that she is getting dehydrated and that her schizophrenia is worse.  She has done this several times in the past and has required admission for psychiatric care.  He states that she has not consistently been taking her medications though she did take them today.  He states she has been talking to herself and seems like she is hallucinating.  The patient denies any SI, HI.  She continues to mumble to herself but denies any AVH.  She denies any medical complaints on my evaluation."   During assessment pt presented not very oriented to answer questions but she was accompanied by her husband. Pts husband states that his wife has been hearing voices and is diagnosed with schizophrenia. He states that pt has not been eating or drinking last few days and that when this happens she shuts down. He states she is med complaint and take medications daily. He states that she may have possible UTI, which at times causes her to shut down as well. Pts current UDS still pending. Pt did confirm she was not SI, HI. Husband states that she can become very paranoid like someone is out to get her. He states she does have child hood trauma and worries about what happened to her in the past. Pt sleep is reported to be good, as well as appetite. Pt's husband states that pt has current PCP who prescribes medications but no psych provider at the moment. During assessment pt was not oriented enough to give specific details, her husband supports her getting her for her mental health at this time due to her not  eating/ drinking as well as increased hallucinations and paranoia.      Diagnosis: Schizophrenia Disposition: Nira Conn, FNP recommends pt for overnight observation, reassess in the morning.   Visit Diagnosis:  Schizophrenia   CCA Screening, Triage and Referral (STR)  Patient Reported Information How did you hear about [place]? Family/Friend  Referral name: Husband/ Referral phone number: [phone]  Whom do you see for routine medical problems? Primary Care  Practice/Facility Name: No data recorded Practice/Facility Phone Number: No data recorded Name of Contact: No data recorded Contact Number: No data recorded Contact Fax Number: No data recorded Prescriber Name: No data recorded Prescriber Address (if known): No data recorded  What Is the Reason for Your Visit/Call Today? schizophrenia (schizophrenia)  How Long Has This Been Causing You Problems? <Week  What Do You Feel Would Help You the Most Today? Assessment Only   Have You Recently Been in Any Inpatient Treatment (Hospital/Detox/Crisis Center/28-Day Program)? No  Name/Location of Program/Hospital:No data recorded How Long Were You There? No data recorded When Were You Discharged? No data recorded  Have You Ever Received Services From [facility] Before? No  Who Do You See at [facility]? No data recorded  Have You Recently Had Any Thoughts About Hurting Yourself? No  Are You Planning to Commit Suicide/Harm Yourself At This time? No   Have you Recently Had Thoughts About Hurting Someone Karolee Ohs? No  Explanation: No data recorded  Have You Used Any Alcohol or Drugs in the Past 24 Hours? No  How Long Ago Did You Use Drugs or Alcohol? No data recorded What Did You Use and How Much? No data recorded  Do You Currently Have a Therapist/Psychiatrist? No  Name of Therapist/Psychiatrist: No data recorded  Have You Been Recently Discharged From Any Office Practice or Programs? No  Explanation of Discharge  From Practice/Program: No data recorded    CCA Screening Triage Referral Assessment Type of Contact: Tele-Assessment  Is this Initial or Reassessment? Initial Assessment  Date Telepsych consult ordered in [facility]:  01/01/20  Time Telepsych consult ordered in [facility]:  No data recorded  Patient Reported Information Reviewed? Yes  Patient Left Without Being Seen? No data recorded Reason for Not Completing Assessment: No data recorded  Collateral Involvement: yes/husband (yes/husband)   Does Patient Have a Court Appointed Legal Guardian? No data recorded Name and Contact of Legal Guardian: Self  If Minor and Not Living with Parent(s), Who has Custody? Self  Is CPS involved or ever been involved? Never  Is APS involved or ever been involved? Never   Patient Determined To Be At Risk for Harm To Self or Others Based on Review of Patient Reported Information or Presenting Complaint? No  Method: No data recorded Availability of Means: No data recorded Intent: No data recorded Notification Required: No data recorded Additional Information for Danger to Others Potential: No data recorded Additional Comments for Danger to Others Potential: No data recorded Are There Guns or Other Weapons in Your Home? No data recorded Types of Guns/Weapons: No data recorded Are These Weapons Safely Secured?                            No data recorded Who Could Verify You Are Able To Have These Secured: No data recorded Do You Have any Outstanding Charges, Pending Court Dates, Parole/Probation? No data recorded Contacted To Inform of Risk of Harm To Self or Others: No data recorded  Location of Assessment: [facility] ED   Does Patient Present under Involuntary Commitment? No  IVC Papers Initial File Date: No data recorded  [place] of Residence: [place]   Patient Currently Receiving the Following Services: Medication Management   Determination of Need: Urgent (48 hours)   Options For Referral:  Overnight Observation    CCA Biopsychosocial  Intake/Chief Complaint:  CCA Intake With Chief Complaint CCA Part Two Date: 01/01/20 Chief Complaint/Presenting Problem: schizophrenia (schizophrenia) Patient's Currently Reported Symptoms/Problems: Patient not eating or drinking last few days, schizophrenia  Mental Health Symptoms Depression:     Mania:     Anxiety:      Psychosis:  Psychosis: Affective flattening/alogia/avolition, Duration of symptoms greater than six months, Hallucinations  Trauma:  Trauma: Re-experience of traumatic event, Avoids reminders of event  Obsessions:  Obsessions: None  Compulsions:  Compulsions: None  Inattention:  Inattention: None  Hyperactivity/Impulsivity:  Hyperactivity/Impulsivity: N/A  Oppositional/Defiant Behaviors:  Oppositional/Defiant Behaviors: None  Emotional Irregularity:  Emotional Irregularity: None  Other Mood/Personality Symptoms:      Mental Status Exam Appearance and self-care  Stature:  Stature: Average  Weight:  Weight: Thin  Clothing:     Grooming:  Grooming: Neglected  Cosmetic use:  Cosmetic Use: Age appropriate  Posture/gait:  Posture/Gait: Normal  Motor activity:  Motor Activity: Slowed  Sensorium  Attention:  Attention: Confused, Normal  Concentration:  Concentration: Normal  Orientation:     Recall/memory:  Recall/Memory: Defective in Short-term, Defective in Recent  Affect and Mood  Affect:  Affect: Not Congruent  Mood:  Mood: Depressed  Relating  Eye contact:  Eye Contact: Staring  Facial expression:  Facial Expression: Depressed  Attitude toward examiner:  Attitude Toward Examiner: Uninterested, Guarded  Thought and Language  Speech flow: Speech Flow: Mute, Paucity  Thought content:  Thought Content: Appropriate to Mood and Circumstances  Preoccupation:     Hallucinations:  Hallucinations: Visual  Organization:     Company secretary of Knowledge:  Fund of Knowledge: Impoverished by (Comment)   Intelligence:     Abstraction:  Abstraction: Functional  Judgement:  Judgement: Poor  Reality Testing:  Reality Testing: Variable  Insight:  Insight: None/zero insight  Decision Making:     Social Functioning  Social Maturity:     Social Judgement:     Stress  Stressors:  Stressors: Other (Comment)  Coping Ability:  Coping Ability: Exhausted  Skill Deficits:  Skill Deficits: Decision making, Communication  Supports:  Supports: Family     CCA Substance Use  Alcohol/Drug Use: Alcohol / Drug Use Pain Medications: see MAR History of alcohol / drug use?: No history of alcohol / drug abuse          ASAM's:  Six Dimensions of Multidimensional Assessment  Dimension 1:  Acute Intoxication and/or Withdrawal Potential:      Dimension 2:  Biomedical Conditions and Complications:      Dimension 3:  Emotional, Behavioral, or Cognitive Conditions and Complications:     Dimension 4:  Readiness to Change:     Dimension 5:  Relapse, Continued use, or Continued Problem Potential:     Dimension 6:  Recovery/Living Environment:     ASAM Severity Score:    ASAM Recommended Level of Treatment:     Substance use Disorder (SUD)    Recommendations for Services/Supports/Treatments: Recommendations for Services/Supports/Treatments Recommendations For Services/Supports/Treatments: Other (Comment)  DSM5 Diagnoses: Patient Active Problem List   Diagnosis Date Noted  . Overactive bladder 07/22/2019  . Vaccine counseling 07/22/2019  . History of changing skin mole 07/22/2019  . Screening for cervical cancer 07/22/2019  . Skin lesion 07/22/2019  . H/O positive serological reaction for syphilis 05/24/2019  . 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 Essential hypertension 11/06/2018  . Encounter for health maintenance examination in adult 08/21/2018  . 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 Physical deconditioning 08/09/2018  . Encephalopathy 07/31/2018  .  Acute metabolic encephalopathy 07/30/2018  . UTI (urinary tract infection) 07/19/2017  . High risk medication use 07/25/2016  . Involuntary commitment   . Paranoid schizophrenia (HCC) 06/16/2015  . Noncompliance 06/16/2015  . Tooth decay 02/10/2012    Patient Centered Plan: Patient is on the following Treatment Plan(s):     Referrals to Alternative Service(s): Referred to Alternative Service(s):   Place:   Date:   Time:    Referred to Alternative Service(s):   Place:   Date:   Time:    Referred to Alternative Service(s):   Place:   Date:   Time:    Referred to Alternative Service(s):   Place:   Date:   Time:      Natasha Mead, LCSWA

## 2020-01-01 NOTE — ED Triage Notes (Signed)
Pt accompanied by husband who reports pt has not been eating or drinking in the past week. Pt has hx of schizophrenia, husband reports pt is taking her medications but has not taken any today. Reports hallucinations. No SI or HI

## 2020-01-02 DIAGNOSIS — F209 Schizophrenia, unspecified: Secondary | ICD-10-CM | POA: Diagnosis not present

## 2020-01-02 DIAGNOSIS — E876 Hypokalemia: Secondary | ICD-10-CM | POA: Diagnosis not present

## 2020-01-02 DIAGNOSIS — F29 Unspecified psychosis not due to a substance or known physiological condition: Secondary | ICD-10-CM | POA: Diagnosis not present

## 2020-01-02 LAB — BASIC METABOLIC PANEL
Anion gap: 14 (ref 5–15)
BUN: 20 mg/dL (ref 8–23)
CO2: 25 mmol/L (ref 22–32)
Calcium: 9.4 mg/dL (ref 8.9–10.3)
Chloride: 105 mmol/L (ref 98–111)
Creatinine, Ser: 0.92 mg/dL (ref 0.44–1.00)
GFR calc non Af Amer: >60 mL/min (ref >60)
Glucose, Bld: 120 mg/dL — ABNORMAL HIGH (ref 70–99)
Potassium: 3.4 mmol/L — ABNORMAL LOW (ref 3.5–5.1)
Sodium: 144 mmol/L (ref 135–145)

## 2020-01-02 LAB — RAPID URINE DRUG SCREEN, HOSP PERFORMED
Amphetamines: NOT DETECTED
Barbiturates: NOT DETECTED
Benzodiazepines: NOT DETECTED
Cocaine: NOT DETECTED
Opiates: NOT DETECTED
Tetrahydrocannabinol: NOT DETECTED

## 2020-01-02 LAB — CBG MONITORING, ED: Glucose-Capillary: 98 mg/dL (ref 70–99)

## 2020-01-02 NOTE — ED Notes (Signed)
Attempted to give pt lunch and sprite- pt refusing. Pt also refusing medications.

## 2020-01-02 NOTE — Progress Notes (Signed)
Pt meets inpatient criteria per Denzil Magnuson, NP. Referral information has been sent to the following hospitals for review:  [facility]  [facility]  [facility]  [facility]  [facility]  [facility]  [name]  [facility]  [facility]      Disposition will continue to follow for inpatient placement needs.    Wells Guiles, MSW, LCSW, LCAS Clinical Social Worker II Disposition CSW [phone]

## 2020-01-02 NOTE — Progress Notes (Signed)
Pt accepted to [facility], main campus  Dr. Estill Cotta is the attending provider.    Call report to [phone]   Deloras @ [facility] ED notified.     Pt is currently voluntary, but will likely need to be placed under IVC if she remains psychotic  Pt is scheduled to arrive at [facility] on 01/03/20 after 8am.    Wells Guiles, MSW, LCSW, LCAS Clinical Social Worker II Disposition CSW [phone]

## 2020-01-02 NOTE — Progress Notes (Signed)
Patient ID: Hafsah Hendler, female   DOB: 04-Jan-1961, 59 y.o.   MRN: [id number]   Psychiatric Reassessment   HPI: Per EDP, " 59 year old female with history of depression, nephrolithiasis, hypertension, hypokalemia, obesity, schizophrenia, who presents the emergency department today for psychiatric evaluation. Patient significant other is at bedside and he states the patient has not been eating or drinking for the last 6 to 7 days. He states he is concerned that she is getting dehydrated and that her schizophrenia is worse. She has done this several times in the past and has required admission for psychiatric care. He states that she has not consistently been taking her medications though she did take them today. He states she has been talking to herself and seems like she is hallucinating. The patient denies any SI, HI. She continues to mumble to herself but denies any AVH. She denies any medical complaints on my evaluation."   During assessment pt presented not very oriented to answer questions but she was accompanied by her husband. Pts husband states that his wife has been hearing voices and is diagnosed with schizophrenia. He states that pt has not been eating or drinking last few days and that when this happens she shuts down. He states she is med complaint and take medications daily. He states that she may have possible UTI, which at times causes her to shut down as well. Pts current UDS still pending. Pt did confirm she was not SI, HI. Husband states that she can become very paranoid like someone is out to get her. He states she does have child hood trauma and worries about what happened to her in the past. Pt sleep is reported to be good, as well as appetite. Pt's husband states that pt has current PCP who prescribes medications but no psych provider at the moment. During assessment pt was not oriented enough to give specific details, her husband supports her getting her for her mental  health at this time due to her not eating/ drinking as well as increased hallucinations and paranoia.  Psychiatric Evaluation: Kerri-Anne Haeberle is a 59 year old female with a history HTN, depression and schizophrenia. Patient has had numerous psychiatric evaluations and psychiatric admissions and several times, patient has presented in a cationic state, refuses to eat or drink, and is has been reported that patients becomes non-complaint with her medications. Medical stabilization is often needed prior to her being transferred to a mental health facility.Per review of chart, patient was taking to the ED following concerns from husband described as patient  getting dehydrated and worsening schizophrenia. As per husband report per chart review, patient has not been eating or drinking, not taking her medications consistently.and has been talking to herself and seems to be hallucinating.  An attempt was made to evaluate patient today by this provider. Unfortunately, it was difficult to obtain information as patient ws selectively mute at times and when she did respond, her words mumbled. During times that questions were not asked, patient could be seen mumbling to self. Per review of chart, patient is refusing medications. And her speech is noted by ED nurse as incomprehensible.Patient also refused to eat breakfast.   Disposition: Discussed case with [facility] treatment team. Due to current presentation, significance of mental health history, recurrent behaviors of not eating or drinking, per Dr. Elsie Ra geropsychiatry is recommended following medical clearance.

## 2020-01-02 NOTE — ED Notes (Signed)
Attempted to call pt's husband back at the number provided- no answer

## 2020-01-02 NOTE — ED Notes (Signed)
Pt willing to take medicines from husband at bedside. Pt also permitted NT to take VS. Pt currently eating with husband.

## 2020-01-02 NOTE — ED Provider Notes (Signed)
Emergency Medicine Observation Re-evaluation Note  Amy Casey is a 59 y.o. female, seen on rounds today.  Pt initially presented to the ED for complaints of Psychiatric Evaluation Currently, the patient is stable.  Physical Exam  BP (!) 145/80 (BP Location: Right Arm)   Pulse 76   Temp 98.6 F (37 C) (Oral)   Resp 14   SpO2 100%  Physical Exam General: wdwn female Cardiac: rrr Lungs: cta Psych: slowed  ED Course / MDM  EKG:EKG Interpretation  Date/Time:  Wednesday January 01 2020 18:46:05 EDT Ventricular Rate:  70 PR Interval:  140 QRS Duration: 92 QT Interval:  412 QTC Calculation: 444 R Axis:   -5 Text Interpretation: Normal sinus rhythm with sinus arrhythmia Moderate voltage criteria for LVH, may be normal variant ( R in aVL , Cornell product ) Nonspecific ST abnormality Abnormal ECG Confirmed by Tilden Fossa [phone]) on 01/01/2020 8:04:04 PM    I have reviewed the labs performed to date as well as medications administered while in observation.  Recent changes in the last 24 hours include none.  Plan  Current plan is for potassium recheck Awaiting geropsych placement. Patient is under full IVC at this time.   Margarita Grizzle, MD 01/02/20 [phone]

## 2020-01-02 NOTE — ED Notes (Signed)
Pt refusing to eat breakfast or any snacks

## 2020-01-02 NOTE — ED Notes (Signed)
Pt incontinent of bladder. Wandering around department. Pt is refusing to change soiled pants and change sheets. This RN and Josephine Cables, RN attempted to change and clean pt. Pt is refusing our care. No sitter at bedside at this time.   This RN called staffing and there is no sitter available. One will be sit down if one becomes available.

## 2020-01-02 NOTE — ED Notes (Addendum)
RN informed Dr.Rees pt is refusing PO meds, lab draws, and vital signs. Per Dr. Madilyn Hook pt able to refuse and doe not require IVC. Primary RN Jon Gills informed

## 2020-01-02 NOTE — ED Notes (Signed)
Unable to give 10:00 medication due to patient refusal. Patient withdrawals at touch or repositioning, does not hold eye contact, and speech is incomprehensible.

## 2020-01-02 NOTE — ED Notes (Signed)
Attempted to obtain labs from pt. Pt is refusing labs.

## 2020-01-02 NOTE — ED Notes (Signed)
Sitter at bedside at this time 

## 2020-01-02 NOTE — ED Notes (Signed)
Call Amy Casey husband [phone]

## 2020-01-03 ENCOUNTER — Other Ambulatory Visit: Payer: Self-pay

## 2020-01-03 DIAGNOSIS — I10 Essential (primary) hypertension: Secondary | ICD-10-CM | POA: Diagnosis not present

## 2020-01-03 DIAGNOSIS — F29 Unspecified psychosis not due to a substance or known physiological condition: Secondary | ICD-10-CM | POA: Diagnosis not present

## 2020-01-03 DIAGNOSIS — F209 Schizophrenia, unspecified: Secondary | ICD-10-CM | POA: Diagnosis not present

## 2020-01-03 DIAGNOSIS — E876 Hypokalemia: Secondary | ICD-10-CM | POA: Diagnosis not present

## 2020-01-03 LAB — BASIC METABOLIC PANEL
Anion gap: 14 (ref 5–15)
BUN: 19 mg/dL (ref 8–23)
CO2: 23 mmol/L (ref 22–32)
Calcium: 9.2 mg/dL (ref 8.9–10.3)
Chloride: 107 mmol/L (ref 98–111)
Creatinine, Ser: 0.91 mg/dL (ref 0.44–1.00)
GFR calc non Af Amer: >60 mL/min (ref >60)
Glucose, Bld: 109 mg/dL — ABNORMAL HIGH (ref 70–99)
Potassium: 3.3 mmol/L — ABNORMAL LOW (ref 3.5–5.1)
Sodium: 144 mmol/L (ref 135–145)

## 2020-01-03 LAB — POTASSIUM: Potassium: 3.8 mmol/L (ref 3.5–5.1)

## 2020-01-03 MED ORDER — ZIPRASIDONE MESYLATE 20 MG IM SOLR: 10.0000 mg | INTRAMUSCULAR | Status: DC | PRN

## 2020-01-03 MED ORDER — POTASSIUM CHLORIDE CRYS ER 20 MEQ PO TBCR: 40.0000 meq | EXTENDED_RELEASE_TABLET | Freq: Once | ORAL | Status: DC

## 2020-01-03 MED ORDER — STERILE WATER FOR INJECTION IJ SOLN
INTRAMUSCULAR | Status: AC
  Filled 2020-01-03: qty 10

## 2020-01-03 MED ORDER — POTASSIUM CHLORIDE CRYS ER 20 MEQ PO TBCR
40.0000 meq | EXTENDED_RELEASE_TABLET | ORAL | Status: AC
  Administered 2020-01-03: 20 meq via ORAL
  Filled 2020-01-03 (×2): qty 2

## 2020-01-03 MED ORDER — SODIUM CHLORIDE 0.9 % IV BOLUS
1000.0000 mL | Freq: Once | INTRAVENOUS | Status: AC
  Administered 2020-01-03: 1000 mL via INTRAVENOUS

## 2020-01-03 MED ORDER — ZIPRASIDONE MESYLATE 20 MG IM SOLR
10.0000 mg | Freq: Once | INTRAMUSCULAR | Status: AC
  Administered 2020-01-03: 10 mg via INTRAMUSCULAR
  Filled 2020-01-03: qty 20

## 2020-01-03 NOTE — ED Provider Notes (Addendum)
TOC/BH notes indicate patient accepted at [facility], Dr Loyola Mast.   Transport being facilitated for transfer.   Patient alert, content, no distress. Vitals normal.  K slightly low, kcl po.  Patient with agitated behavior, wanting to roam about ED, and appears to be responding to internal stimuli. Attempts to redirect/reassure. Geodon IM for symptom improvement.   Recheck pt, calm and alert. Appears stable for transport.   On repeat vitals prior to transfer, pt mildly tachycardic (currently hr 106), and bp lower than prior.  No fever/chills/sweats), pt denies specific c/o or pain. Chest cta. abd soft nt. Pt ambulatory in ED without faintness. RN indicates poor po intake in past couple days, iv ns bolus, recheck temp/vitals. Repeat k 3.8.       Cathren Laine, MD 01/03/20 1450

## 2020-01-03 NOTE — ED Notes (Signed)
Attempted to take a manual BP and pulse and pt would not allow it

## 2020-01-03 NOTE — ED Notes (Signed)
Report given to Vernona Rieger at [facility]

## 2020-01-03 NOTE — Discharge Instructions (Addendum)
Transfer to [facility] 

## 2020-01-03 NOTE — ED Notes (Signed)
Pt pacing around not receiving IV fluids.

## 2020-01-03 NOTE — ED Notes (Addendum)
PT took 20 meq Potassium x 1. Pt spit out the second potassium pill. Pt was counseled as to the necessity of potassium pills. MD Pollina made aware.

## 2020-01-03 NOTE — ED Notes (Signed)
Ordered breakfast--Amy Casey 

## 2020-01-03 NOTE — ED Notes (Signed)
Staffing called for sitter. Per staffing sitter will not be available prior to 11AM.

## 2020-01-04 DIAGNOSIS — Z9114 Patient's other noncompliance with medication regimen: Secondary | ICD-10-CM | POA: Diagnosis not present

## 2020-01-04 DIAGNOSIS — R638 Other symptoms and signs concerning food and fluid intake: Secondary | ICD-10-CM | POA: Diagnosis not present

## 2020-01-04 DIAGNOSIS — F29 Unspecified psychosis not due to a substance or known physiological condition: Secondary | ICD-10-CM | POA: Diagnosis not present

## 2020-01-04 DIAGNOSIS — E876 Hypokalemia: Secondary | ICD-10-CM | POA: Diagnosis not present

## 2020-01-04 DIAGNOSIS — I10 Essential (primary) hypertension: Secondary | ICD-10-CM | POA: Diagnosis not present

## 2020-01-04 DIAGNOSIS — R32 Unspecified urinary incontinence: Secondary | ICD-10-CM | POA: Diagnosis not present

## 2020-01-04 DIAGNOSIS — F200 Paranoid schizophrenia: Secondary | ICD-10-CM | POA: Diagnosis not present

## 2020-01-04 DIAGNOSIS — E870 Hyperosmolality and hypernatremia: Secondary | ICD-10-CM | POA: Diagnosis not present

## 2020-01-04 DIAGNOSIS — N3281 Overactive bladder: Secondary | ICD-10-CM | POA: Diagnosis not present

## 2020-01-04 DIAGNOSIS — N179 Acute kidney failure, unspecified: Secondary | ICD-10-CM | POA: Diagnosis not present

## 2020-01-04 DIAGNOSIS — F209 Schizophrenia, unspecified: Secondary | ICD-10-CM | POA: Diagnosis not present

## 2020-01-04 DIAGNOSIS — E860 Dehydration: Secondary | ICD-10-CM | POA: Diagnosis not present

## 2020-01-04 DIAGNOSIS — R4182 Altered mental status, unspecified: Secondary | ICD-10-CM | POA: Diagnosis not present

## 2020-01-04 NOTE — ED Notes (Signed)
Breakfast Ordered 

## 2020-01-04 NOTE — ED Notes (Signed)
Called [facility] for transport. 

## 2020-01-04 NOTE — ED Notes (Signed)
Per [facility] RN can call report to [phone] at 8 am. At that time pt will be assigned room after being deemed appropriate for facility.

## 2020-01-04 NOTE — ED Provider Notes (Signed)
  Physical Exam  BP 120/80 (BP Location: Left Arm)   Pulse (!) 105   Temp 97.7 F (36.5 C) (Oral)   Resp 19   SpO2 96%   Physical Exam  ED Course/Procedures     Procedures  MDM  59yo female accepted for transfer to [place] [facility]. Became more tachycardic last night and given IV fluids. Likely dehydration. No sign of acute medical abnormality at this time. HR improved and consistent with her psychiatric agitation. Medically cleared for transfer to [facility] for further care.       Alvira Monday, MD 01/04/20 2154

## 2020-01-04 NOTE — ED Notes (Signed)
No sitter available at [facility] per staffing

## 2020-01-09 DIAGNOSIS — E860 Dehydration: Secondary | ICD-10-CM | POA: Diagnosis not present

## 2020-01-09 DIAGNOSIS — G928 Other toxic encephalopathy: Secondary | ICD-10-CM | POA: Diagnosis not present

## 2020-01-09 DIAGNOSIS — R32 Unspecified urinary incontinence: Secondary | ICD-10-CM | POA: Diagnosis not present

## 2020-01-09 DIAGNOSIS — R630 Anorexia: Secondary | ICD-10-CM | POA: Diagnosis not present

## 2020-01-09 DIAGNOSIS — R2689 Other abnormalities of gait and mobility: Secondary | ICD-10-CM | POA: Diagnosis not present

## 2020-01-09 DIAGNOSIS — I10 Essential (primary) hypertension: Secondary | ICD-10-CM | POA: Diagnosis not present

## 2020-01-09 DIAGNOSIS — E870 Hyperosmolality and hypernatremia: Secondary | ICD-10-CM | POA: Diagnosis not present

## 2020-01-09 DIAGNOSIS — N3281 Overactive bladder: Secondary | ICD-10-CM | POA: Diagnosis not present

## 2020-01-09 DIAGNOSIS — R404 Transient alteration of awareness: Secondary | ICD-10-CM | POA: Diagnosis not present

## 2020-01-09 DIAGNOSIS — F202 Catatonic schizophrenia: Secondary | ICD-10-CM | POA: Diagnosis not present

## 2020-01-09 DIAGNOSIS — R000 Tachycardia, unspecified: Secondary | ICD-10-CM | POA: Diagnosis not present

## 2020-01-09 DIAGNOSIS — F209 Schizophrenia, unspecified: Secondary | ICD-10-CM | POA: Diagnosis not present

## 2020-01-09 DIAGNOSIS — R638 Other symptoms and signs concerning food and fluid intake: Secondary | ICD-10-CM | POA: Diagnosis not present

## 2020-01-09 DIAGNOSIS — Z79899 Other long term (current) drug therapy: Secondary | ICD-10-CM | POA: Diagnosis not present

## 2020-01-09 DIAGNOSIS — N179 Acute kidney failure, unspecified: Secondary | ICD-10-CM | POA: Diagnosis not present

## 2020-01-09 DIAGNOSIS — F329 Major depressive disorder, single episode, unspecified: Secondary | ICD-10-CM | POA: Diagnosis not present

## 2020-01-09 DIAGNOSIS — Z20822 Contact with and (suspected) exposure to covid-19: Secondary | ICD-10-CM | POA: Diagnosis not present

## 2020-01-09 DIAGNOSIS — Z6828 Body mass index (BMI) 28.0-28.9, adult: Secondary | ICD-10-CM | POA: Diagnosis not present

## 2020-01-09 DIAGNOSIS — Z9114 Patient's other noncompliance with medication regimen: Secondary | ICD-10-CM | POA: Diagnosis not present

## 2020-01-09 DIAGNOSIS — A530 Latent syphilis, unspecified as early or late: Secondary | ICD-10-CM | POA: Diagnosis not present

## 2020-01-09 DIAGNOSIS — R4182 Altered mental status, unspecified: Secondary | ICD-10-CM | POA: Diagnosis not present

## 2020-01-09 DIAGNOSIS — E785 Hyperlipidemia, unspecified: Secondary | ICD-10-CM | POA: Diagnosis not present

## 2020-01-09 DIAGNOSIS — F200 Paranoid schizophrenia: Secondary | ICD-10-CM | POA: Diagnosis not present

## 2020-01-09 DIAGNOSIS — R8281 Pyuria: Secondary | ICD-10-CM | POA: Diagnosis not present

## 2020-01-20 ENCOUNTER — Encounter: Payer: BC Managed Care – PPO | Admitting: Medical

## 2020-01-21 ENCOUNTER — Encounter: Payer: Self-pay | Admitting: Medical

## 2020-01-21 DIAGNOSIS — F061 Catatonic disorder due to known physiological condition: Secondary | ICD-10-CM | POA: Diagnosis not present

## 2020-01-21 DIAGNOSIS — F4310 Post-traumatic stress disorder, unspecified: Secondary | ICD-10-CM | POA: Diagnosis not present

## 2020-01-21 DIAGNOSIS — F250 Schizoaffective disorder, bipolar type: Secondary | ICD-10-CM | POA: Diagnosis not present

## 2020-05-15 DIAGNOSIS — F4310 Post-traumatic stress disorder, unspecified: Secondary | ICD-10-CM | POA: Diagnosis not present

## 2020-05-15 DIAGNOSIS — F250 Schizoaffective disorder, bipolar type: Secondary | ICD-10-CM | POA: Diagnosis not present

## 2020-08-11 ENCOUNTER — Other Ambulatory Visit: Payer: Self-pay | Admitting: Medical

## 2020-11-23 ENCOUNTER — Other Ambulatory Visit: Payer: Self-pay | Admitting: Medical

## 2020-12-07 DIAGNOSIS — F4310 Post-traumatic stress disorder, unspecified: Secondary | ICD-10-CM | POA: Diagnosis not present

## 2020-12-07 DIAGNOSIS — F250 Schizoaffective disorder, bipolar type: Secondary | ICD-10-CM | POA: Diagnosis not present

## 2020-12-20 ENCOUNTER — Other Ambulatory Visit: Payer: Self-pay | Admitting: Medical

## 2020-12-24 ENCOUNTER — Encounter: Payer: BC Managed Care – PPO | Admitting: Medical

## 2020-12-24 DIAGNOSIS — Z0000 Encounter for general adult medical examination without abnormal findings: Secondary | ICD-10-CM

## 2020-12-30 ENCOUNTER — Encounter: Payer: BC Managed Care – PPO | Admitting: Medical

## 2020-12-31 ENCOUNTER — Other Ambulatory Visit: Payer: Self-pay

## 2020-12-31 ENCOUNTER — Encounter: Payer: Self-pay | Admitting: Medical

## 2020-12-31 ENCOUNTER — Other Ambulatory Visit: Payer: Self-pay | Admitting: Medical

## 2020-12-31 ENCOUNTER — Ambulatory Visit: Payer: BC Managed Care – PPO | Admitting: Medical

## 2020-12-31 VITALS — BP 138/88 | HR 78 | Wt 184.2 lb

## 2020-12-31 DIAGNOSIS — Z136 Encounter for screening for cardiovascular disorders: Secondary | ICD-10-CM | POA: Insufficient documentation

## 2020-12-31 DIAGNOSIS — I10 Essential (primary) hypertension: Secondary | ICD-10-CM | POA: Diagnosis not present

## 2020-12-31 DIAGNOSIS — Z1211 Encounter for screening for malignant neoplasm of colon: Secondary | ICD-10-CM

## 2020-12-31 DIAGNOSIS — F061 Catatonic disorder due to known physiological condition: Secondary | ICD-10-CM

## 2020-12-31 DIAGNOSIS — R002 Palpitations: Secondary | ICD-10-CM | POA: Diagnosis not present

## 2020-12-31 DIAGNOSIS — F323 Major depressive disorder, single episode, severe with psychotic features: Secondary | ICD-10-CM

## 2020-12-31 DIAGNOSIS — Z2820 Immunization not carried out because of patient decision for unspecified reason: Secondary | ICD-10-CM

## 2020-12-31 DIAGNOSIS — R410 Disorientation, unspecified: Secondary | ICD-10-CM

## 2020-12-31 DIAGNOSIS — I679 Cerebrovascular disease, unspecified: Secondary | ICD-10-CM | POA: Insufficient documentation

## 2020-12-31 DIAGNOSIS — F200 Paranoid schizophrenia: Secondary | ICD-10-CM

## 2020-12-31 DIAGNOSIS — Z0000 Encounter for general adult medical examination without abnormal findings: Secondary | ICD-10-CM

## 2020-12-31 DIAGNOSIS — Z1231 Encounter for screening mammogram for malignant neoplasm of breast: Secondary | ICD-10-CM | POA: Insufficient documentation

## 2020-12-31 DIAGNOSIS — N3281 Overactive bladder: Secondary | ICD-10-CM

## 2020-12-31 DIAGNOSIS — Z91199 Patient's noncompliance with other medical treatment and regimen due to unspecified reason: Secondary | ICD-10-CM

## 2020-12-31 DIAGNOSIS — Z79899 Other long term (current) drug therapy: Secondary | ICD-10-CM

## 2020-12-31 DIAGNOSIS — E785 Hyperlipidemia, unspecified: Secondary | ICD-10-CM

## 2020-12-31 DIAGNOSIS — R9431 Abnormal electrocardiogram [ECG] [EKG]: Secondary | ICD-10-CM

## 2020-12-31 LAB — POCT URINALYSIS DIP (CLINITEK)
Bilirubin, UA: NEGATIVE
Blood, UA: NEGATIVE
Glucose, UA: NEGATIVE mg/dL
Leukocytes, UA: NEGATIVE
Nitrite, UA: NEGATIVE
Spec Grav, UA: 1.025 (ref 1.010–1.025)
Urobilinogen, UA: 0.2 E.U./dL
pH, UA: 5.5 (ref 5.0–8.0)

## 2020-12-31 NOTE — Progress Notes (Signed)
Subjective:   HPI  Amy Casey is a 60 y.o. female who presents for Chief Complaint  Patient presents with   Dizziness    Thins is r/t medication unsure of which one.    Altered Mental Status    New increased confusion for the past 3 days per pt and patients husband. Is more fatigued than usual   Annual Exam    Patient Care Team: Damiel Barthold, Kermit Balo, PA-C as PCP - General (Family Medicine) Michah Minton, Cleda Mccreedy [facility] Medicine) Sees dentist Sees eye doctor [facility]  Concerns: Here with her husband.  She is here for well visit as well as other concerns.  She has been past due to come in at her request.  She also no showed 1 appointment recently and was late for another appointment all this week.  She has a medical history complicated by significant mental health issues including schizophrenia, psychosis and depression.  She is currently seeing [facility].  She just saw them this week but has not started the medication they prescribed.  Husband notes that she has not been all that compliant going to appointments in the past year.  She was acting unusual at work recently so he took her in to the psychiatry office.  She sometimes can hold a job for periods of time but not lately as she has been confused somewhat.  She notes that in the past Abilify made her dizzy so she quit taking this.  Husband notes that she has been skipping meals some lately.  She has been slower to respond to questions lately.  She does not seem her usual self.    She is a non-smoker.  No recent alcohol.  She denies drug use.  No recent fall or trauma.  She is compliant with her blood pressure pill but she either ran out or quit taking her cholesterol pill sometime earlier this year   Past Medical History:  Diagnosis Date   Depression    History of echocardiogram 2011   [facility]   History of renal stone    Hyperlipidemia    Hypertension    Hypokalemia    Nephrolithiasis     Obesity    Schizophrenia (HCC)    Wears glasses     Family History  Problem Relation Age of Onset   Diabetes Mother    Heart disease Mother    Chronic Renal Failure Mother    Cancer Maternal Uncle        lung   Kidney disease Mother        dialysis   Stroke Neg Hx    Hypertension Neg Hx    Hyperlipidemia Neg Hx      Current Outpatient Medications:    FLUoxetine (PROZAC) 20 MG capsule, Take 1 capsule (20 mg total) by mouth daily., Disp: 30 capsule, Rfl: 0   amLODipine (NORVASC) 10 MG tablet, TAKE 1 TABLET BY MOUTH ONCE DAILY. APPOINTMENT REQUIRED FOR FUTURE REFILLS., Disp: 30 tablet, Rfl: 2   ARIPiprazole (ABILIFY) 5 MG tablet, Take 5 mg by mouth daily. (Patient not taking: Reported on 12/31/2020), Disp: , Rfl:    OLANZapine (ZYPREXA) 20 MG tablet, Take 1 tablet (20 mg total) by mouth at bedtime. (Patient not taking: Reported on 12/31/2020), Disp: 30 tablet, Rfl: 1   rosuvastatin (CRESTOR) 10 MG tablet, Take 1 tablet (10 mg total) by mouth daily. (Patient not taking: Reported on 12/31/2020), Disp: 90 tablet, Rfl: 3  Allergies  Allergen Reactions   Paliperidone Other (  See Comments)    Angioedema, drooling, slurred speech, ptosis   Lisinopril Rash and Other (See Comments)    Angioedema    Sulfa Antibiotics Itching      Reviewed their medical, surgical, family, social, medication, and allergy history and updated chart as appropriate.   Review of Systems Constitutional: -fever, -chills, -sweats, -unexpected weight change, +decreased appetite, -fatigue Allergy: -sneezing, -itching, -congestion Dermatology: -changing moles, --rash, -lumps ENT: -runny nose, -ear pain, -sore throat, -hoarseness, -sinus pain, -teeth pain, - ringing in ears, -hearing loss, -nosebleeds Cardiology: +occasional brief chest pain, -palpitations, -swelling, -difficulty breathing when lying flat, -waking up short of breath Respiratory: -cough, -shortness of breath, -difficulty breathing with exercise or  exertion, -wheezing, -coughing up blood Gastroenterology: -abdominal pain, -nausea, -vomiting, -diarrhea, -constipation, -blood in stool, -changes in bowel movement, -difficulty swallowing or eating Hematology: -bleeding, -bruising  Musculoskeletal: -joint aches, -muscle aches, -joint swelling, -back pain, -neck pain, -cramping, -changes in gait Ophthalmology: denies vision changes, eye redness, itching, discharge Urology: -burning with urination, -difficulty urinating, -blood in urine, -urinary frequency, -urgency, -incontinence Neurology: -headache, -weakness, -tingling, -numbness, -memory loss, -falls, -dizziness, +confusion Psychology: +depressed mood, -agitation, -sleep problems Breast/gyn: -breast tendnerss, -discharge, -lumps, -vaginal discharge,- irregular periods, -heavy periods   Depression screen [facility] 2/9 07/22/2019 06/16/2015  Decreased Interest 0 1  Down, Depressed, Hopeless 0 1  PHQ - 2 Score 0 2  Altered sleeping - 2  Tired, decreased energy - 2  Change in appetite - 1  Feeling bad or failure about yourself  - 3  Trouble concentrating - 0  Moving slowly or fidgety/restless - 0  Suicidal thoughts - 2  PHQ-9 Score - 12  Difficult doing work/chores - Somewhat difficult       Objective:  BP 138/88 (BP Location: Right Arm, Patient Position: Sitting)   Pulse 78   Wt 184 lb 3.2 oz (83.6 kg)   BMI 26.43 kg/m   General appearance: wd, wn, African American female seen this somewhat catatonic Skin: Unremarkable HEENT: normocephalic, conjunctiva/corneas normal, sclerae anicteric, PERRLA, EOMi, nares patent, no discharge or erythema, pharynx normal Neck: supple, no lymphadenopathy, no thyromegaly, no masses, normal ROM, no bruits Chest: non tender, normal shape and expansion Heart: RRR, normal S1, S2, no murmurs Lungs: CTA bilaterally, no wheezes, rhonchi, or rales Abdomen: +bs, soft, non tender, non distended, no masses, no hepatomegaly, no splenomegaly, no bruits Back: non  tender, normal ROM, no scoliosis Musculoskeletal: upper extremities non tender, no obvious deformity, normal ROM throughout, lower extremities non tender, no obvious deformity, normal ROM throughout Extremities: no edema, no cyanosis, no clubbing Pulses: 2+ symmetric, upper and lower extremities, normal cap refill Neurological: Somewhat alert, oriented x 3, somewhat slow to follow commands at times but otherwise, CN2-12 intact, strength normal upper extremities and lower extremities, sensation normal throughout, DTRs blunted throughout, no cerebellar signs, gait normal Psychiatric: Seems somewhat catatonic at times but at other times will answer questions but slowly.  She seems slow to respond to questions in general, no verbal expression, but not agitated or anxious either Breast/gyn/rectal - deferred to gynecology    MMSE reviewed   Adult ECG Report Indication chest discomfort, physical, rate 69 bpm, PR 132 ms, QRS 100 ms, QTC 585 ms, axis -10 degrees, normal sinus rhythm, possible LVH, nonspecific T wave abnormality, prolonged QT   Assessment and Plan :   Encounter Diagnoses  Name Primary?   Encounter for health maintenance examination in adult Yes   Confusion    Psychogenic depressive psychosis (HCC)  Paranoid schizophrenia (HCC)    Noncompliance    High risk medication use    Essential hypertension    Catatonia    Screening for heart disease    Palpitation    Encounter for screening mammogram for malignant neoplasm of breast    Screen for colon cancer    Cerebrovascular disease    Overactive bladder    Vaccine refused by patient    Abnormal EKG    Hyperlipidemia, unspecified hyperlipidemia type      This visit was a preventative care visit, also known as wellness visit or routine physical.   Topics typically include healthy lifestyle, diet, exercise, preventative care, vaccinations, sick and well care, proper use of emergency dept and after hours care, as well as other  concerns.     Recommendations: Continue to return yearly for your annual wellness and preventative care visits.  This gives [place] a chance to discuss healthy lifestyle, exercise, vaccinations, review your chart record, and perform screenings where appropriate.  I recommend you see your eye doctor yearly for routine vision care.  I recommend you see your dentist yearly for routine dental care including hygiene visits twice yearly.   Vaccination recommendations were reviewed Immunization History  Administered Date(s) Administered   PFIZER(Purple Top)SARS-COV-2 Vaccination 06/18/2019, 06/29/2019   Tdap 02/10/2012    I recommend a yearly flu vaccine I recommend a covid booster I recommend pneumococcal vaccine I recommend shingrix vaccine  Please call your insurance to see if they cover these vaccines.  She declines all vaccines today  Screening for cancer: Colon cancer screening: I recommend referral for colonoscopy  Please call to schedule your mammogram  [facility]  [phone] [address] [place], [place] [address]  Cervical cancer screening: Pap smear up to date from 06/2019.   Skin cancer screening: Check your skin regularly for new changes, growing lesions, or other lesions of concern Come in for evaluation if you have skin lesions of concern.  Lung cancer screening: If you have a greater than 20 pack year history of tobacco use, then you may qualify for lung cancer screening with a chest CT scan.   Please call your insurance company to inquire about coverage for this test.  We currently don't have screenings for other cancers besides breast, cervical, colon, and lung cancers.  If you have a strong family history of cancer or have other cancer screening concerns, please let me know.    Bone health: Get at least 150 minutes of aerobic exercise weekly Get weight bearing exercise at least once weekly Bone density test:  A  bone density test is an imaging test that uses a type of X-ray to measure the amount of calcium and other minerals in your bones. The test may be used to diagnose or screen you for a condition that causes weak or thin bones (osteoporosis), predict your risk for a broken bone (fracture), or determine how well your osteoporosis treatment is working. The bone density test is recommended for females 65 and older, or females or males <65 if certain risk factors such as thyroid disease, long term use of steroids such as for asthma or rheumatological issues, vitamin D deficiency, estrogen deficiency, family history of osteoporosis, self or family history of fragility fracture in first degree relative.    Heart health: Get at least 150 minutes of aerobic exercise weekly Limit alcohol It is important to maintain a healthy blood pressure and healthy cholesterol numbers  Heart disease  screening: Screening for heart disease includes screening for blood pressure, fasting lipids, glucose/diabetes screening, BMI height to weight ratio, reviewed of smoking status, physical activity, and diet.    Goals include blood pressure 120/80 or less, maintaining a healthy lipid/cholesterol profile, preventing diabetes or keeping diabetes numbers under good control, not smoking or using tobacco products, exercising most days per week or at least 150 minutes per week of exercise, and eating healthy variety of fruits and vegetables, healthy oils, and avoiding unhealthy food choices like fried food, fast food, high sugar and high cholesterol foods.       Medical care options: I recommend you continue to seek care here first for routine care.  We try really hard to have available appointments Monday through Friday daytime hours for sick visits, acute visits, and physicals.  Urgent care should be used for after hours and weekends for significant issues that cannot wait till the next day.  The emergency department should be used  for significant potentially life-threatening emergencies.  The emergency department is expensive, can often have long wait times for less significant concerns, so try to utilize primary care, urgent care, or telemedicine when possible to avoid unnecessary trips to the emergency department.  Virtual visits and telemedicine have been introduced since the pandemic started in 2020, and can be convenient ways to receive medical care.  We offer virtual appointments as well to assist you in a variety of options to seek medical care.    Separate significant issues discussed: History of psychosis, catatonia, schizophrenia, depress ion-follow-up with psychiatry and start new medications prescribed by psychiatry this week as soon as possible.  Confusion, somewhat catatonic, strong history of mental health problems-when she is on her medicines and being compliant she seems to do relatively well.  Currently she is not in that position as she is not doing well.  Updated labs today.  Abnormal EKG-referral to cardiology  Her mental health issues interfere with her overall care and compliance.  I advised her husband to always go to her mental health appointments with her if possible to make sure nothing is forgotten and that she is taking the appropriate medicine that they discussed  Hypertension-continue current medications  Overactive bladder-no current concern or complaint  Hyperlipidemia-noncompliant, updated labs today    Zayna was seen today for dizziness, altered mental status and annual exam.  Diagnoses and all orders for this visit:  Encounter for health maintenance examination in adult -     TSH -     Vitamin B12 -     CBC with Differential/Platelet -     Comprehensive metabolic panel -     Lipid panel -     EKG 12-Lead -     Urinalysis -     HIV Antibody (routine testing w rflx) -     Hepatitis C antibody -     MM DIGITAL SCREENING BILATERAL; Future -     RPR -     Ambulatory  referral to Cardiology  Confusion -     TSH -     Vitamin B12 -     HIV Antibody (routine testing w rflx) -     Hepatitis C antibody -     RPR  Psychogenic depressive psychosis (HCC) -     RPR  Paranoid schizophrenia (HCC)  Noncompliance  High risk medication use -     CBC with Differential/Platelet -     Comprehensive metabolic panel  Essential hypertension -  Lipid panel -     EKG 12-Lead  Catatonia  Screening for heart disease -     Lipid panel -     EKG 12-Lead -     Ambulatory referral to Cardiology  Palpitation -     EKG 12-Lead -     Ambulatory referral to Cardiology  Encounter for screening mammogram for malignant neoplasm of breast -     MM DIGITAL SCREENING BILATERAL; Future  Screen for colon cancer -     Ambulatory referral to Gastroenterology  Cerebrovascular disease  Overactive bladder -     POCT URINALYSIS DIP (CLINITEK)  Vaccine refused by patient  Abnormal EKG -     Ambulatory referral to Cardiology  Hyperlipidemia, unspecified hyperlipidemia type  Follow-up pending labs, yearly for physical

## 2021-01-01 ENCOUNTER — Other Ambulatory Visit: Payer: Self-pay | Admitting: Medical

## 2021-01-01 LAB — COMPREHENSIVE METABOLIC PANEL
ALT: 23 IU/L (ref 0–32)
AST: 21 IU/L (ref 0–40)
Albumin/Globulin Ratio: 1.5 (ref 1.2–2.2)
Albumin: 5 g/dL — ABNORMAL HIGH (ref 3.8–4.8)
Alkaline Phosphatase: 90 IU/L (ref 44–121)
BUN/Creatinine Ratio: 39 — ABNORMAL HIGH (ref 12–28)
BUN: 42 mg/dL — ABNORMAL HIGH (ref 8–27)
Bilirubin Total: 0.5 mg/dL (ref 0.0–1.2)
CO2: 23 mmol/L (ref 20–29)
Calcium: 10.2 mg/dL (ref 8.7–10.3)
Chloride: 100 mmol/L (ref 96–106)
Creatinine, Ser: 1.07 mg/dL — ABNORMAL HIGH (ref 0.57–1.00)
Globulin, Total: 3.3 g/dL (ref 1.5–4.5)
Glucose: 102 mg/dL — ABNORMAL HIGH (ref 70–99)
Potassium: 3.2 mmol/L — ABNORMAL LOW (ref 3.5–5.2)
Sodium: 149 mmol/L — ABNORMAL HIGH (ref 134–144)
Total Protein: 8.3 g/dL (ref 6.0–8.5)
eGFR: 59 mL/min/{1.73_m2} — ABNORMAL LOW (ref >59)

## 2021-01-01 LAB — CBC WITH DIFFERENTIAL/PLATELET
Basophils Absolute: 0 10*3/uL (ref 0.0–0.2)
Basos: 0 %
EOS (ABSOLUTE): 0.1 10*3/uL (ref 0.0–0.4)
Eos: 1 %
Hematocrit: 42.2 % (ref 34.0–46.6)
Hemoglobin: 14 g/dL (ref 11.1–15.9)
Immature Grans (Abs): 0 10*3/uL (ref 0.0–0.1)
Immature Granulocytes: 0 %
Lymphocytes Absolute: 1.8 10*3/uL (ref 0.7–3.1)
Lymphs: 22 %
MCH: 28.8 pg (ref 26.6–33.0)
MCHC: 33.2 g/dL (ref 31.5–35.7)
MCV: 87 fL (ref 79–97)
Monocytes Absolute: 0.6 10*3/uL (ref 0.1–0.9)
Monocytes: 7 %
Neutrophils Absolute: 5.9 10*3/uL (ref 1.4–7.0)
Neutrophils: 70 %
Platelets: 373 10*3/uL (ref 150–450)
RBC: 4.86 x10E6/uL (ref 3.77–5.28)
RDW: 12.8 % (ref 11.7–15.4)
WBC: 8.4 10*3/uL (ref 3.4–10.8)

## 2021-01-01 LAB — URINALYSIS
Bilirubin, UA: NEGATIVE
Glucose, UA: NEGATIVE
Nitrite, UA: NEGATIVE
RBC, UA: NEGATIVE
Specific Gravity, UA: 1.023 (ref 1.005–1.030)
Urobilinogen, Ur: 1 mg/dL (ref 0.2–1.0)
pH, UA: 5.5 (ref 5.0–7.5)

## 2021-01-01 LAB — LIPID PANEL
Chol/HDL Ratio: 4.1 ratio (ref 0.0–4.4)
Cholesterol, Total: 177 mg/dL (ref 100–199)
HDL: 43 mg/dL (ref >39)
LDL Chol Calc (NIH): 119 mg/dL — ABNORMAL HIGH (ref 0–99)
Triglycerides: 82 mg/dL (ref 0–149)
VLDL Cholesterol Cal: 15 mg/dL (ref 5–40)

## 2021-01-01 LAB — HIV ANTIBODY (ROUTINE TESTING W REFLEX): HIV Screen 4th Generation wRfx: NONREACTIVE

## 2021-01-01 LAB — HEPATITIS C ANTIBODY: Hep C Virus Ab: <0.1 s/co ratio (ref 0.0–0.9)

## 2021-01-01 LAB — TSH: TSH: 0.725 u[IU]/mL (ref 0.450–4.500)

## 2021-01-01 LAB — RPR: RPR Ser Ql: NONREACTIVE

## 2021-01-01 LAB — VITAMIN B12: Vitamin B-12: 849 pg/mL (ref 232–1245)

## 2021-01-01 MED ORDER — POTASSIUM CHLORIDE ER 10 MEQ PO TBCR: 10.0000 meq | EXTENDED_RELEASE_TABLET | Freq: Every day | ORAL | 2 refills | Status: DC

## 2021-01-01 MED ORDER — AMLODIPINE BESYLATE 10 MG PO TABS: ORAL_TABLET | ORAL | 3 refills | Status: DC

## 2021-01-01 MED ORDER — ROSUVASTATIN CALCIUM 10 MG PO TABS: 10.0000 mg | ORAL_TABLET | Freq: Every day | ORAL | 3 refills | Status: DC

## 2021-01-04 ENCOUNTER — Encounter: Payer: Self-pay | Admitting: Internal Medicine

## 2021-01-04 ENCOUNTER — Encounter (HOSPITAL_COMMUNITY): Payer: Self-pay | Admitting: Emergency Medicine

## 2021-01-04 ENCOUNTER — Other Ambulatory Visit: Payer: Self-pay

## 2021-01-04 ENCOUNTER — Emergency Department (HOSPITAL_COMMUNITY)
Admission: EM | Admit: 2021-01-04 | Discharge: 2021-01-05 | Disposition: A | Discharge status: Other Acute Inpatient Hospital | Payer: BC Managed Care – PPO | Attending: Emergency Medicine | Admitting: Emergency Medicine

## 2021-01-04 DIAGNOSIS — R443 Hallucinations, unspecified: Secondary | ICD-10-CM

## 2021-01-04 DIAGNOSIS — F209 Schizophrenia, unspecified: Secondary | ICD-10-CM | POA: Insufficient documentation

## 2021-01-04 DIAGNOSIS — Z79899 Other long term (current) drug therapy: Secondary | ICD-10-CM | POA: Diagnosis not present

## 2021-01-04 DIAGNOSIS — F29 Unspecified psychosis not due to a substance or known physiological condition: Secondary | ICD-10-CM | POA: Insufficient documentation

## 2021-01-04 DIAGNOSIS — I10 Essential (primary) hypertension: Secondary | ICD-10-CM | POA: Insufficient documentation

## 2021-01-04 DIAGNOSIS — R440 Auditory hallucinations: Secondary | ICD-10-CM | POA: Diagnosis not present

## 2021-01-04 DIAGNOSIS — E876 Hypokalemia: Secondary | ICD-10-CM | POA: Insufficient documentation

## 2021-01-04 LAB — CBC
HCT: 42.5 % (ref 36.0–46.0)
Hemoglobin: 14 g/dL (ref 12.0–15.0)
MCH: 29.3 pg (ref 26.0–34.0)
MCHC: 32.9 g/dL (ref 30.0–36.0)
MCV: 88.9 fL (ref 80.0–100.0)
Platelets: 347 10*3/uL (ref 150–400)
RBC: 4.78 MIL/uL (ref 3.87–5.11)
RDW: 12.6 % (ref 11.5–15.5)
WBC: 8 10*3/uL (ref 4.0–10.5)
nRBC: 0 % (ref 0.0–0.2)

## 2021-01-04 LAB — URINALYSIS, ROUTINE W REFLEX MICROSCOPIC
Bilirubin Urine: NEGATIVE
Glucose, UA: NEGATIVE mg/dL
Hgb urine dipstick: NEGATIVE
Ketones, ur: 20 mg/dL — AB
Nitrite: NEGATIVE
Protein, ur: 30 mg/dL — AB
Specific Gravity, Urine: 1.019 (ref 1.005–1.030)
pH: 5 (ref 5.0–8.0)

## 2021-01-04 LAB — COMPREHENSIVE METABOLIC PANEL
ALT: 22 U/L (ref 0–44)
AST: 21 U/L (ref 15–41)
Albumin: 4.5 g/dL (ref 3.5–5.0)
Alkaline Phosphatase: 68 U/L (ref 38–126)
Anion gap: 12 (ref 5–15)
BUN: 24 mg/dL — ABNORMAL HIGH (ref 8–23)
CO2: 29 mmol/L (ref 22–32)
Calcium: 9.5 mg/dL (ref 8.9–10.3)
Chloride: 100 mmol/L (ref 98–111)
Creatinine, Ser: 0.88 mg/dL (ref 0.44–1.00)
GFR, Estimated: >60 mL/min (ref >60)
Glucose, Bld: 112 mg/dL — ABNORMAL HIGH (ref 70–99)
Potassium: 2.6 mmol/L — CL (ref 3.5–5.1)
Sodium: 141 mmol/L (ref 135–145)
Total Bilirubin: 0.8 mg/dL (ref 0.3–1.2)
Total Protein: 8 g/dL (ref 6.5–8.1)

## 2021-01-04 LAB — RAPID URINE DRUG SCREEN, HOSP PERFORMED
Amphetamines: NOT DETECTED
Barbiturates: NOT DETECTED
Benzodiazepines: NOT DETECTED
Cocaine: NOT DETECTED
Opiates: NOT DETECTED
Tetrahydrocannabinol: NOT DETECTED

## 2021-01-04 LAB — SALICYLATE LEVEL: Salicylate Lvl: <7 mg/dL — ABNORMAL LOW (ref 7.0–30.0)

## 2021-01-04 LAB — ACETAMINOPHEN LEVEL: Acetaminophen (Tylenol), Serum: <10 ug/mL — ABNORMAL LOW (ref 10–30)

## 2021-01-04 LAB — ETHANOL: Alcohol, Ethyl (B): <10 mg/dL (ref <10)

## 2021-01-04 MED ORDER — ROSUVASTATIN CALCIUM 10 MG PO TABS
10.0000 mg | ORAL_TABLET | Freq: Every day | ORAL | Status: DC
  Administered 2021-01-04 – 2021-01-05 (×2): 10 mg via ORAL
  Filled 2021-01-04 (×2): qty 1

## 2021-01-04 MED ORDER — SODIUM CHLORIDE 0.9 % IV BOLUS
1000.0000 mL | Freq: Once | INTRAVENOUS | Status: AC
  Administered 2021-01-04: 1000 mL via INTRAVENOUS

## 2021-01-04 MED ORDER — AMLODIPINE BESYLATE 5 MG PO TABS
10.0000 mg | ORAL_TABLET | Freq: Every day | ORAL | Status: DC
  Administered 2021-01-04 – 2021-01-05 (×2): 10 mg via ORAL
  Filled 2021-01-04 (×2): qty 2

## 2021-01-04 MED ORDER — POTASSIUM CHLORIDE ER 10 MEQ PO TBCR
10.0000 meq | EXTENDED_RELEASE_TABLET | Freq: Every day | ORAL | Status: DC
  Administered 2021-01-04 – 2021-01-05 (×2): 10 meq via ORAL
  Filled 2021-01-04 (×3): qty 1

## 2021-01-04 MED ORDER — POTASSIUM CHLORIDE CRYS ER 20 MEQ PO TBCR
40.0000 meq | EXTENDED_RELEASE_TABLET | Freq: Once | ORAL | Status: AC
  Administered 2021-01-04: 40 meq via ORAL
  Filled 2021-01-04: qty 2

## 2021-01-04 MED ORDER — POTASSIUM CHLORIDE 10 MEQ/100ML IV SOLN
10.0000 meq | Freq: Once | INTRAVENOUS | Status: AC
  Administered 2021-01-04: 10 meq via INTRAVENOUS
  Filled 2021-01-04: qty 100

## 2021-01-04 MED ORDER — ARIPIPRAZOLE 5 MG PO TABS
5.0000 mg | ORAL_TABLET | Freq: Every day | ORAL | Status: DC
  Administered 2021-01-04 – 2021-01-05 (×2): 5 mg via ORAL
  Filled 2021-01-04 (×2): qty 1

## 2021-01-04 NOTE — ED Provider Notes (Addendum)
[facility]-EMERGENCY DEPT Provider Note   CSN: [id number] Arrival date & time: 01/04/21  1610     History Chief Complaint  Patient presents with   Hallucinations    Amy Casey is a 60 y.o. female.  60 year old female with prior medical history as detailed below presents for evaluation.  Amy Casey is accompanied by her sister.  Patient's sister reports that the patient has had increased hallucinations.  Patient is more withdrawn over the last week.  Patient with prior history of schizophrenia.  Patient is reportedly taking medications as prescribed.  Patient with minimal p.o. intake over the last several days.  Patient is alert during my exam.  Amy Casey is comfortable.  Amy Casey does report that Amy Casey is hearing voices.  Amy Casey did not volunteer to describe her hallucinations to this provider.  The history is provided by the patient.  Illness Location:  Increased auditory hallucinations, withdrawn behavior, decreased p.o. intake Severity:  Moderate Onset quality:  Gradual Duration:  1 week Timing:  Constant Progression:  Worsening Chronicity:  New     Past Medical History:  Diagnosis Date   Depression    History of echocardiogram 2011   [facility]   History of renal stone    Hyperlipidemia    Hypertension    Hypokalemia    Nephrolithiasis    Obesity    Schizophrenia (HCC)    Wears glasses     Patient Active Problem List   Diagnosis Date Noted   Confusion 12/31/2020   Screening for heart disease 12/31/2020   Palpitation 12/31/2020   Cerebrovascular disease 12/31/2020   Screen for colon cancer 12/31/2020   Encounter for screening mammogram for malignant neoplasm of breast 12/31/2020   Vaccine refused by patient 12/31/2020   Hyperlipidemia 12/31/2020   Abnormal EKG 12/31/2020   Overactive bladder 07/22/2019   Vaccine counseling 07/22/2019   History of changing skin mole 07/22/2019   Screening for cervical cancer 07/22/2019   Skin lesion 07/22/2019   H/O  positive serological reaction for syphilis 05/24/2019   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   Essential hypertension 11/06/2018   Encounter for health maintenance examination in adult 08/21/2018   Catatonia 08/16/2018   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   Physical deconditioning 08/09/2018   Encephalopathy 07/31/2018   Acute metabolic encephalopathy 07/30/2018   UTI (urinary tract infection) 07/19/2017   High risk medication use 07/25/2016   Involuntary commitment    Paranoid schizophrenia (HCC) 06/16/2015   Noncompliance 06/16/2015   Tooth decay 02/10/2012    Past Surgical History:  Procedure Laterality Date   LUMBAR PUNCTURE  2015   WISDOM TOOTH EXTRACTION       OB History   No obstetric history on file.     Family History  Problem Relation Age of Onset   Diabetes Mother    Heart disease Mother    Chronic Renal Failure Mother    Cancer Maternal Uncle        lung   Kidney disease Mother        dialysis   Stroke Neg Hx    Hypertension Neg Hx    Hyperlipidemia Neg Hx     Social History   Tobacco Use   Smoking status: Never   Smokeless tobacco: Never  Vaping Use   Vaping Use: Never used  Substance Use Topics   Alcohol use: No   Drug use: No    Home Medications Prior to Admission medications   Medication Sig Start Date End Date Taking? Authorizing Provider  potassium chloride (KLOR-CON) 10 MEQ tablet Take 1 tablet (10 mEq total) by mouth daily. 01/01/21   Tysinger, Kermit Balo, PA-C  amLODipine (NORVASC) 10 MG tablet TAKE 1 TABLET BY MOUTH ONCE DAILY. APPOINTMENT REQUIRED FOR FUTURE REFILLS. 01/01/21   Tysinger, Kermit Balo, PA-C  ARIPiprazole (ABILIFY) 5 MG tablet Take 5 mg by mouth daily. Patient not taking: Reported on 12/31/2020 12/20/20   [provider]  FLUoxetine (PROZAC) 20 MG capsule Take 1 capsule (20 mg total) by mouth daily. 05/05/19 12/31/20  Hughie Closs, MD  OLANZapine (ZYPREXA) 20 MG tablet Take 1 tablet (20 mg total) by mouth at  bedtime. Patient not taking: Reported on 12/31/2020 08/20/18   Clapacs, Jackquline Denmark, MD  rosuvastatin (CRESTOR) 10 MG tablet Take 1 tablet (10 mg total) by mouth daily. 01/01/21 01/01/22  Tysinger, Kermit Balo, PA-C    Allergies    Paliperidone, Lisinopril, and Sulfa antibiotics  Review of Systems   Review of Systems  Unable to perform ROS: Mental status change   Physical Exam Updated Vital Signs BP 139/78   Pulse 66   Temp 98.8 F (37.1 C) (Oral)   Resp 15   SpO2 99%   Physical Exam Vitals and nursing note reviewed.  Constitutional:      General: Amy Casey is not in acute distress.    Appearance: Normal appearance. Amy Casey is well-developed.  HENT:     Head: Normocephalic and atraumatic.  Eyes:     Conjunctiva/sclera: Conjunctivae normal.     Pupils: Pupils are equal, round, and reactive to light.  Cardiovascular:     Rate and Rhythm: Normal rate and regular rhythm.     Heart sounds: Normal heart sounds.  Pulmonary:     Effort: Pulmonary effort is normal. No respiratory distress.     Breath sounds: Normal breath sounds.  Abdominal:     General: There is no distension.     Palpations: Abdomen is soft.     Tenderness: There is no abdominal tenderness.  Musculoskeletal:        General: No deformity. Normal range of motion.     Cervical back: Normal range of motion and neck supple.  Skin:    General: Skin is warm and dry.  Neurological:     General: No focal deficit present.     Mental Status: Amy Casey is alert and oriented to person, place, and time.  Psychiatric:     Comments: Withdrawn  Reports Auditory hallucinations - "hearing voices"  Denies SI/HI     ED Results / Procedures / Treatments   Labs (all labs ordered are listed, but only abnormal results are displayed) Labs Reviewed  COMPREHENSIVE METABOLIC PANEL - Abnormal; Notable for the following components:      Result Value   Potassium 2.6 (*)    Glucose, Bld 112 (*)    BUN 24 (*)    All other components within normal  limits  SALICYLATE LEVEL - Abnormal; Notable for the following components:   Salicylate Lvl <7.0 (*)    All other components within normal limits  ACETAMINOPHEN LEVEL - Abnormal; Notable for the following components:   Acetaminophen (Tylenol), Serum <10 (*)    All other components within normal limits  URINE CULTURE  ETHANOL  CBC  RAPID URINE DRUG SCREEN, HOSP PERFORMED  URINALYSIS, ROUTINE W REFLEX MICROSCOPIC    EKG None  Radiology No results found.  Procedures Procedures   Medications Ordered in ED Medications  sodium chloride 0.9 % bolus 1,000 mL (1,000 mLs Intravenous  New Bag/Given 01/04/21 1959)  potassium chloride 10 mEq in 100 mL IVPB (10 mEq Intravenous New Bag/Given 01/04/21 2000)  potassium chloride SA (KLOR-CON) CR tablet 40 mEq (40 mEq Oral Given 01/04/21 2001)    ED Course  I have reviewed the triage vital signs and the nursing notes.  Pertinent labs & imaging results that were available during my care of the patient were reviewed by me and considered in my medical decision making (see chart for details).    MDM Rules/Calculators/A&P                           MDM  MSE complete  Nicey Krah was evaluated in Emergency Department on 01/04/2021 for the symptoms described in the history of present illness. Amy Casey was evaluated in the context of the global COVID-19 pandemic, which necessitated consideration that the patient might be at risk for infection with the SARS-CoV-2 virus that causes COVID-19. Institutional protocols and algorithms that pertain to the evaluation of patients at risk for COVID-19 are in a state of rapid change based on information released by regulatory bodies including the CDC and federal and state organizations. These policies and algorithms were followed during the patient's care in the ED.  Patient brought in by family secondary to worsening symptoms of schizophrenia.   Screening labs reveal hypokalemia (K 2.6). Replacement initiated  in ED.   Patient without significant acute medical pathology that would interfere with psych evaluation and likely placement.   Will plan on holding patient in ED while awaiting psych evaluation. Will plan on recheck of potassium tomorrow (10/11 at 1000).   Home medications ordered.   Final Clinical Impression(s) / ED Diagnoses Final diagnoses:  Hallucinations  Hypokalemia    Rx / DC Orders ED Discharge Orders     None        Wynetta Fines, MD 01/04/21 2210    Wynetta Fines, MD 01/04/21 2210

## 2021-01-04 NOTE — ED Notes (Signed)
Patient is resting comfortably. 

## 2021-01-04 NOTE — ED Notes (Signed)
Pt arrived in room 20.  Family at bedside.  Connected to purwick and monitor.

## 2021-01-04 NOTE — ED Provider Notes (Signed)
Emergency Medicine Provider Triage Evaluation Note  Amy Casey , a 60 y.o. female  was evaluated in triage.  Pt complains of auditory and visual hallucinations.  Patient has had the symptoms previously.  Symptoms have been present for the last 10 days.  Patient has had urinary frequency and urgency over this time as well.  Patient has been taking prescribed antipsychotic medication.  Review of Systems  Positive: Auditory and visual hallucinations, urinary frequency Negative: Suicidal ideations, homicidal ideations  Physical Exam  BP (!) 144/115 (BP Location: Left Arm)   Pulse 62   Temp 98.8 F (37.1 C) (Oral)   Resp 18   SpO2 95%  Gen:   Awake, no distress   Resp:  Normal effort  MSK:   Moves extremities without difficulty  Other:    Medical Decision Making  Medically screening exam initiated at 4:50 PM.  Appropriate orders placed.  Shaheen Star was informed that the remainder of the evaluation will be completed by another provider, this initial triage assessment does not replace that evaluation, and the importance of remaining in the ED until their evaluation is complete.     Haskel Schroeder, PA-C 01/04/21 1651    Wynetta Fines, MD 01/04/21 [phone]

## 2021-01-04 NOTE — ED Triage Notes (Signed)
Per family-states she has been having A/H  and confusion for 10 days progressive getting worse-states she is not sure if she has a UTI-incontinent of urine while in triage-has been taking medication except for today

## 2021-01-05 LAB — URINE CULTURE

## 2021-01-05 LAB — POTASSIUM: Potassium: 3.5 mmol/L (ref 3.5–5.1)

## 2021-01-05 NOTE — BH Assessment (Addendum)
BHH Assessment Progress Note   Per Elta Guadeloupe, NP, this pt requires psychiatric hospitalization at this time at a facility providing specialty care for geriatric patients.  The following facilities have been contacted to seek placement for this pt, with results as noted:  Beds available, information sent, decision pending: [facility]  If this voluntary pt is accepted to a facility, please discuss disposition with pt to be sure that she agrees to the plan.  If a facility agrees to accept pt and the plan changes in any way please call the facility to inform them of the change.  Final disposition is pending as of this writing.  Doylene Canning, [place] Behavioral Health Coordinator [phone]

## 2021-01-05 NOTE — Progress Notes (Signed)
Patient discharged at 17:18 and transported via safe transport for a voluntary admission to a psychiatric facility in [place]. Upon arrival to the facility, patient refused to be admitted.  Education officer, environmental called ED charge to inform her of the need to have the patient transferred back to our facility.  This Clinical research associate asked to speak with facility Interior and spatial designer.  Facility advised to reevaluate patient to see if the patient now requires an involuntary admission.  If not, the patient can refuse treatment.  Retail banker states that her facility does not do evaluations.  Director advised to have patient transported to closest emergency room for reevaluation.

## 2021-01-05 NOTE — ED Notes (Signed)
Safe Transport called 

## 2021-01-05 NOTE — BH Assessment (Addendum)
@  7989, Followed up with Anola Gurney and Huntley Dec, NP to see if patient is available to be seen by TTS for her initial TTS assessment. Notified nursing that TTS is ready to see patient if she is alert and able to participate in a TTS assessment. Message sent via secure chat; awaiting a reply.
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
## 2021-01-05 NOTE — Progress Notes (Addendum)
Pt was accepted to [facility] today 01/05/21 to Traditional unit or [facility] unit anytime.  Pt meets inpatient criteria Per Elta Guadeloupe, NP.  Attending Physician will be Dr. Althea Grimmer   Report can be called to: [phone]   Pt can arrive after anytime   Gaetano Net, RN and EDP Clydia Llano, MD notified via secure chat. Per Clelia Croft, RN transportation has been scheduled.   Kelton Pillar, LCSWA 01/05/2021 @ 5:16 PM

## 2021-01-05 NOTE — ED Provider Notes (Signed)
Emergency Medicine Observation Re-evaluation Note  Amy Casey is a 60 y.o. female, seen on rounds today.  Pt initially presented to the ED for complaints of Hallucinations and Psychiatric Evaluation Currently, the patient is evaluated by behavioral health.  They are recommending [facility] psych admission.  For her psychosis.  Patient with hallucinations.  Patient's potassium was 2.6.  It was corrected and rechecked and today it was 3.5.Marland Kitchen  Physical Exam  BP 138/82   Pulse 75   Temp 98.8 F (37.1 C) (Oral)   Resp 16   SpO2 99%  Physical Exam General: Nontoxic no acute distress Cardiac: RRR Lungs: No respiratory distress Psych: Baseline  ED Course / MDM  EKG:EKG Interpretation  Date/Time:  Monday January 04 2021 22:40:25 EDT Ventricular Rate:  71 PR Interval:  141 QRS Duration: 105 QT Interval:  416 QTC Calculation: 453 R Axis:   -11 Text Interpretation: Sinus rhythm Confirmed by Amy Casey [phone]) on 01/04/2021 10:42:55 PM  I have reviewed the labs performed to date as well as medications administered while in observation.  Recent changes in the last 24 hours include patient's initial potassium was 2.6.  She was supplemented today's recheck was 3.5.  So that has corrected back to normal.  Patient evaluated by behavioral health who recommend angio site placement..  Plan  Current plan is for you of psych admission.  Amy Casey is not under involuntary commitment.     Amy Mulders, MD 01/05/21 1301

## 2021-01-05 NOTE — BH Assessment (Signed)
Contacted RN to arrange tele-assessment and she said Pt is currently too somnolent to participate in assessment. RN agrees to contact TTS when Pt is able to participate.   Pamalee Leyden, [facility], [facility] Triage Specialist [phone]

## 2021-01-05 NOTE — Progress Notes (Addendum)
@  8:10pm CSW receives a phone call from Lafonda Mosses, RN with [facility] [phone] reporting that pt is refusing to sign the consent to treat. Lafonda Mosses, RN inquires if pt can be IVC'd. Lafonda Mosses, RN also reports that nursing was not given report. CSW advised that nursing was communicated with and was informed of the phone number for report.  @8 :47pm CSW contacts [facility] charge RN, to advise that [facility] reports that they did not receive nursing report. CSW advises charge RN that CSW sent a secure chat to the covering RN; [facility], RN who verbalized and reported that she made contact with transported via secure chat at 5:18pm. Charge RN advised that she was willing to speak to nursing at [facility] if needed.  @9 :02pm CSW reaches out to [facility], MD to advise of the current situation in regards that pt is refusing to sign or provide verbal consent to be treated at [facility]. CSW and physician discuss processes of IVC.  @9 :16pm CSW consults with CSW's supervisor about situation.  @9 :21pm CSW called [facility] back to report that they must initiate the IVC since pt is at their hospital and their provider accepted the pt. CSW spoke with [facility], RN who advised that her supervisor was attempting to call this CSW. CSW informed that CSW had a missed call but was on the phone. CSW learned from [place], RN that [facility] was going to discharge the pt to the community since the pt is refusing to consent to treat, and advised CSW that pt will not allow nursing at [facility] to call her family, and reported that pt is refusing to eat. Lafonda Mosses, RN reported that pt will be discharged to the street/ community if consent is not obtained.  @9 :31pm CSW consults with CSW's supervisor and shares updated information. CSW advised to contact the magistrate's office for clarity on IVC processes.  @ 9:51pm CSW contacts [place] to obtain the night covering magistrate's phone number and CSW was advised to  call [phone] or [phone].  @9 :58pm CSW contacts the night covering magistrate who advices CSW that IVC can not be enforced since pt has left Lafonda Mosses.  @10 :17pm CSW contacts , RN who advises CSW that pt will be transferred to [facility] ED since she still refuses and IVC cannot be initiated due to there not being an onsite provider. CSW sent a secure chat to Dr. [phone] to provide an update that pt would be transferred to [facility] ED.    @11 :58PM CSW receives a phone from pt's sister [phone]. CSW advised that CSW will follow up with CSW's supervisor and reach back out via phone tomorrow.  , MSW, [facility] 01/05/2021 10:49 PM

## 2021-01-06 ENCOUNTER — Emergency Department (HOSPITAL_COMMUNITY)
Admission: EM | Admit: 2021-01-06 | Discharge: 2021-01-07 | Disposition: A | Discharge status: Other Acute Inpatient Hospital | Payer: BC Managed Care – PPO | Attending: Emergency Medicine | Admitting: Emergency Medicine

## 2021-01-06 ENCOUNTER — Other Ambulatory Visit: Payer: Self-pay

## 2021-01-06 ENCOUNTER — Ambulatory Visit (HOSPITAL_COMMUNITY)
Admission: EM | Admit: 2021-01-06 | Discharge: 2021-01-06 | Disposition: A | Discharge status: Other Acute Inpatient Hospital | Payer: BC Managed Care – PPO | Attending: Psychiatry | Admitting: Psychiatry

## 2021-01-06 ENCOUNTER — Encounter (HOSPITAL_COMMUNITY): Payer: Self-pay

## 2021-01-06 DIAGNOSIS — Z79899 Other long term (current) drug therapy: Secondary | ICD-10-CM | POA: Diagnosis not present

## 2021-01-06 DIAGNOSIS — I10 Essential (primary) hypertension: Secondary | ICD-10-CM | POA: Insufficient documentation

## 2021-01-06 DIAGNOSIS — F200 Paranoid schizophrenia: Secondary | ICD-10-CM

## 2021-01-06 DIAGNOSIS — R443 Hallucinations, unspecified: Secondary | ICD-10-CM

## 2021-01-06 DIAGNOSIS — Z20822 Contact with and (suspected) exposure to covid-19: Secondary | ICD-10-CM | POA: Diagnosis not present

## 2021-01-06 LAB — CBC WITH DIFFERENTIAL/PLATELET
Abs Immature Granulocytes: 0.02 10*3/uL (ref 0.00–0.07)
Basophils Absolute: 0 10*3/uL (ref 0.0–0.1)
Basophils Relative: 0 %
Eosinophils Absolute: 0.1 10*3/uL (ref 0.0–0.5)
Eosinophils Relative: 2 %
HCT: 42.5 % (ref 36.0–46.0)
Hemoglobin: 13.9 g/dL (ref 12.0–15.0)
Immature Granulocytes: 0 %
Lymphocytes Relative: 20 %
Lymphs Abs: 1.5 10*3/uL (ref 0.7–4.0)
MCH: 29.3 pg (ref 26.0–34.0)
MCHC: 32.7 g/dL (ref 30.0–36.0)
MCV: 89.5 fL (ref 80.0–100.0)
Monocytes Absolute: 0.5 10*3/uL (ref 0.1–1.0)
Monocytes Relative: 6 %
Neutro Abs: 5.4 10*3/uL (ref 1.7–7.7)
Neutrophils Relative %: 72 %
Platelets: 321 10*3/uL (ref 150–400)
RBC: 4.75 MIL/uL (ref 3.87–5.11)
RDW: 12.6 % (ref 11.5–15.5)
WBC: 7.5 10*3/uL (ref 4.0–10.5)
nRBC: 0 % (ref 0.0–0.2)

## 2021-01-06 LAB — BASIC METABOLIC PANEL
Anion gap: 14 (ref 5–15)
BUN: 23 mg/dL (ref 8–23)
CO2: 27 mmol/L (ref 22–32)
Calcium: 9.9 mg/dL (ref 8.9–10.3)
Chloride: 107 mmol/L (ref 98–111)
Creatinine, Ser: 0.91 mg/dL (ref 0.44–1.00)
GFR, Estimated: >60 mL/min (ref >60)
Glucose, Bld: 97 mg/dL (ref 70–99)
Potassium: 3.3 mmol/L — ABNORMAL LOW (ref 3.5–5.1)
Sodium: 148 mmol/L — ABNORMAL HIGH (ref 135–145)

## 2021-01-06 LAB — RESP PANEL BY RT-PCR (FLU A&B, COVID) ARPGX2
Influenza A by PCR: NEGATIVE
Influenza B by PCR: NEGATIVE
SARS Coronavirus 2 by RT PCR: NEGATIVE

## 2021-01-06 LAB — CBG MONITORING, ED: Glucose-Capillary: 96 mg/dL (ref 70–99)

## 2021-01-06 LAB — TSH: TSH: 1.106 u[IU]/mL (ref 0.350–4.500)

## 2021-01-06 MED ORDER — POTASSIUM CHLORIDE CRYS ER 20 MEQ PO TBCR
20.0000 meq | EXTENDED_RELEASE_TABLET | Freq: Once | ORAL | Status: AC
  Administered 2021-01-06: 20 meq via ORAL
  Filled 2021-01-06: qty 1

## 2021-01-06 NOTE — BH Assessment (Signed)
Pt to [facility] with her sister and husband reporting that patient was at [facility] yesterday and recommended for inpt treatment. Pt was accepted at [facility] but refused to sign consent forms when she got there so they sent pt back home in a cab. Pt continues to be confused, paranoid and monosyllabic.   Pt is urgent

## 2021-01-06 NOTE — ED Notes (Signed)
Pt picked up by [employer] to be transported to [facility].  Nurse to nurse report has been called prior to patient leaving.  Family sitting with pt at time [employer] arrived and are aware of where she will be transported to.  Safety maintained.

## 2021-01-06 NOTE — ED Provider Notes (Addendum)
Behavioral Health Urgent Care Medical Screening Exam  Patient Name: Amy Casey MRN: [id number] Date of Evaluation: 01/06/21 Chief Complaint:   Diagnosis:  Final diagnoses:  Paranoid schizophrenia, chronic condition with acute exacerbation (HCC)    History of Present illness: Amy Casey is a 60 y.o. female.  Who was brought in by her sister and husband as patient is paranoid, seems to be responding to internal stimuli, cannot communicate.  Patient was sent from [facility] long ED to [facility] yesterday, patient was unable to sign, was not IVC and so was discharged from [facility]  Patient reevaluated this morning, continues to be psychotic, does not make eye contact, is not able to communicate except in single words, and continues to require inpatient psychiatric care  Patient will be IVC at this facility.  She has a history of schizophrenia, had medication changes done at [facility] 2 weeks ago and since then has decompensated.  Sister reports that patient is able to do well when on medications, also works and this change has happened over the past 2 weeks to the point where patient is not eating, drinking, was dehydrated while in the ED, was also started on potassium    In regards to her medical history, patient has a history of hypertension, takes medications for it.  Sister denies any other medical conditions.    Psychiatric Specialty Exam  Presentation  General Appearance:Casual  Eye Contact:Minimal  Speech:Blocked  Speech Volume:Decreased  Handedness:No data recorded  Mood and Affect  Mood:Depressed  Affect:Blunt   Thought Process  Thought Processes:Disorganized  Descriptions of Associations:Loose  Orientation:-- (person)  Thought Content:Perseveration; Paranoid Ideation; Illogical  Diagnosis of Schizophrenia or Schizoaffective disorder in past: Yes  Duration of Psychotic Symptoms: Greater than six months  Hallucinations:--  (seems to be responding to internal stimuli)  Ideas of Reference:Paranoia; Delusions  Suicidal Thoughts:No  Homicidal Thoughts:No   Sensorium  Memory:Immediate Poor; Recent Poor; Remote Poor  Judgment:Poor  Insight:None   Executive Functions  Concentration:Poor  Attention Span:Poor  Recall:Poor  Fund of Knowledge:Poor  Language:Poor   Psychomotor Activity  Psychomotor Activity:Psychomotor Retardation; Mannerisms   Assets  Assets:Housing; Social Support   Sleep  Sleep:Poor  Number of hours:  No data recorded  No data recorded  Physical Exam: Physical Exam Constitutional:      Appearance: Normal appearance.  HENT:     Head: Normocephalic and atraumatic.     Nose: Nose normal.     Mouth/Throat:     Mouth: Mucous membranes are moist.  Eyes:     Extraocular Movements: Extraocular movements intact.     Conjunctiva/sclera: Conjunctivae normal.     Pupils: Pupils are equal, round, and reactive to light.  Cardiovascular:     Rate and Rhythm: Normal rate and regular rhythm.  Pulmonary:     Effort: Pulmonary effort is normal.     Breath sounds: Normal breath sounds.  Abdominal:     General: Bowel sounds are normal.  Musculoskeletal:        General: Normal range of motion.     Cervical back: Normal range of motion and neck supple.  Skin:    General: Skin is warm.  Neurological:     General: No focal deficit present.   Review of Systems  Constitutional:  Negative for fever.  HENT:  Negative for congestion, ear discharge and sore throat.   Eyes:  Negative for blurred vision, double vision and discharge.  Respiratory: Negative.  Negative for cough, shortness of breath and wheezing.  Cardiovascular: Negative.  Negative for chest pain and palpitations.  Gastrointestinal: Negative.  Negative for abdominal pain, heartburn, nausea and vomiting.  Musculoskeletal:  Positive for myalgias.  Skin: Negative.   Neurological: Negative.  Negative for dizziness,  seizures, loss of consciousness and headaches.  Endo/Heme/Allergies:  Negative for environmental allergies.  Psychiatric/Behavioral:  Positive for hallucinations. Negative for depression and suicidal ideas.   Blood pressure (!) 143/75, pulse 72, temperature 97.8 F (36.6 C), temperature source Oral, resp. rate 16, SpO2 99 %. There is no height or weight on file to calculate BMI.  Musculoskeletal: Strength & Muscle Tone: within normal limits Gait & Station: normal Patient leans: N/A   [facility] MSE Discharge Disposition for Follow up and Recommendations: Based on my evaluation I certify that psychiatric inpatient services furnished can reasonably be expected to improve the patient's condition which I recommend transfer to an appropriate accepting facility.    Nelly Rout, MD 01/06/2021, 11:33 AM

## 2021-01-06 NOTE — Progress Notes (Signed)
Follow-up with Referral and Current IVC paperwork with [facility]   This CSW received pt's IVC paperwork via fax. CSW contacted [facility] and spoke with [place] [phone]. Cedric advised this CSW not to fax the IVC paperwork tonight for this pt, because he did not want it to get lost with other referrals that would be coming in tonight. Cedric explained to CSW that the accepting provider is off this evening/ tonight and is not on call. Cedric stated that the accepting provider as of yesterday 01/05/21 Dr. Althea Grimmer will be back in the morning and he would need to review this pt's case with the accepting provider along with providing the current IVC paperwork. Cedric requested that 1st shift fax the IVC paperwork in the morning and call and speak with Cedric after 9am to allow time for him to discuss the pt with Dr. Althea Grimmer. CSW shared that CSW would communicate this specific information via shift report and have 1st shift, Janice Coffin follow up.   Maryjean Ka, MSW, [facility] 01/06/2021 6:46 PM

## 2021-01-06 NOTE — ED Provider Notes (Signed)
[facility]-EMERGENCY DEPT Provider Note   CSN: [id number] Arrival date & time: 01/06/21  1444     History Chief Complaint  Patient presents with   Psychiatric Evaluation    Amy Casey is a 60 y.o. female.  HPI  60 year old female presenting under IVC due to concern for decompensated schizophrenia.  The patient presents with PD under IVC from behavioral health urgent care.  The patient was initially brought in by her sister and had been responding to internal stimuli and cannot communicate.  She was deemed to meet criteria for inpatient services due to concern for hallucinations, responding to internal stimuli, concern for decompensated schizophrenia.  Level 5 caveat due to psychiatric condition.  Past Medical History:  Diagnosis Date   Depression    History of echocardiogram 2011   [facility]   History of renal stone    Hyperlipidemia    Hypertension    Hypokalemia    Nephrolithiasis    Obesity    Schizophrenia (HCC)    Wears glasses     Patient Active Problem List   Diagnosis Date Noted   Confusion 12/31/2020   Screening for heart disease 12/31/2020   Palpitation 12/31/2020   Cerebrovascular disease 12/31/2020   Screen for colon cancer 12/31/2020   Encounter for screening mammogram for malignant neoplasm of breast 12/31/2020   Vaccine refused by patient 12/31/2020   Hyperlipidemia 12/31/2020   Abnormal EKG 12/31/2020   Overactive bladder 07/22/2019   Vaccine counseling 07/22/2019   History of changing skin mole 07/22/2019   Screening for cervical cancer 07/22/2019   Skin lesion 07/22/2019   H/O positive serological reaction for syphilis 05/24/2019   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   Essential hypertension 11/06/2018   Encounter for health maintenance examination in adult 08/21/2018   Catatonia 08/16/2018   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     Family History  Problem Relation Age of Onset   Diabetes Mother    Heart disease Mother    Chronic Renal Failure Mother    Cancer Maternal Uncle        lung   Kidney disease Mother        dialysis   Stroke Neg Hx    Hypertension Neg Hx    Hyperlipidemia Neg Hx     Social History   Tobacco Use   Smoking status: Never   Smokeless tobacco: Never  Vaping Use   Vaping Use: Never used  Substance Use Topics   Alcohol use: No   Drug use: No    Home Medications Prior to Admission medications   Medication Sig Start Date End Date Taking? Authorizing Provider  amLODipine (NORVASC) 10 MG tablet TAKE 1 TABLET BY MOUTH ONCE DAILY. APPOINTMENT REQUIRED FOR FUTURE REFILLS. Patient taking differently: Take 10 mg by mouth in the morning. 01/01/21  Yes Tysinger, Kermit Balo, PA-C  ARIPiprazole (ABILIFY) 5 MG tablet Take 5 mg by mouth in the morning. 12/20/20  Yes [provider]  FLUoxetine (PROZAC) 20 MG tablet  Take 20 mg by mouth daily. 12/31/20  Yes [provider]  hydrOXYzine (ATARAX/VISTARIL) 25 MG tablet Take 25 mg by mouth 3 (three) times daily as needed for anxiety or itching.   Yes [provider]  FLUoxetine (PROZAC) 20 MG capsule Take 1 capsule (20 mg total) by mouth daily. Patient not taking: Reported on 01/06/2021 05/05/19 01/06/21  Hughie Closs, MD  OLANZapine (ZYPREXA) 20 MG tablet Take 1 tablet (20 mg total) by mouth at bedtime. 08/20/18   Clapacs, Jackquline Denmark, MD  potassium chloride (KLOR-CON) 10 MEQ tablet Take 1 tablet (10 mEq total) by mouth daily. Patient not taking:  No sig reported 01/01/21   Tysinger, Kermit Balo, PA-C  rosuvastatin (CRESTOR) 10 MG tablet Take 1 tablet (10 mg total) by mouth daily. Patient not taking: No sig reported 01/01/21 01/01/22  Tysinger, Kermit Balo, PA-C    Allergies    Paliperidone, Lisinopril, and Sulfa antibiotics  Review of Systems   Review of Systems  Unable to perform ROS: Psychiatric disorder   Physical Exam Updated Vital Signs BP (!) 144/87   Pulse 100   Temp (!) 97.4 F (36.3 C) (Oral)   Resp 16   Wt 83.5 kg   SpO2 100%   BMI 26.40 kg/m   Physical Exam Vitals and nursing note reviewed.  Constitutional:      General: She is not in acute distress. HENT:     Head: Normocephalic and atraumatic.  Eyes:     Conjunctiva/sclera: Conjunctivae normal.     Pupils: Pupils are equal, round, and reactive to light.  Cardiovascular:     Rate and Rhythm: Normal rate and regular rhythm.  Pulmonary:     Effort: Pulmonary effort is normal. No respiratory distress.  Abdominal:     General: There is no distension.     Tenderness: There is no guarding.  Musculoskeletal:        General: No deformity or signs of injury.     Cervical back: Normal range of motion and neck supple.  Skin:    Findings: No lesion or rash.  Neurological:     General: No focal deficit present.     Mental Status: She is alert. Mental status is at baseline.  Psychiatric:        Mood and Affect: Affect is flat.        Behavior: Behavior is uncooperative.     Comments: Uncooperative, unwilling to cooperate with psychiatric examination.    ED Results / Procedures / Treatments   Labs (all labs ordered are listed, but only abnormal results are displayed) Labs Reviewed  BASIC METABOLIC PANEL - Abnormal; Notable for the following components:      Result Value   Sodium 148 (*)    Potassium 3.3 (*)    All other components within normal limits  RESP PANEL BY RT-PCR (FLU A&B, COVID) ARPGX2  CBC WITH DIFFERENTIAL/PLATELET  TSH  CBG MONITORING, ED     EKG None  Radiology No results found.  Procedures Procedures   Medications Ordered in ED Medications  potassium chloride SA (KLOR-CON) CR tablet 20 mEq (has no administration in time range)    ED Course  I have reviewed the triage vital signs and the nursing notes.  Pertinent labs & imaging results that were available during my care of the patient were reviewed by me and considered in my medical decision making (see chart for details).    MDM Rules/Calculators/A&P  60 year old female presenting under IVC due to concern for decompensated schizophrenia.  The patient presents with PD under IVC from behavioral health urgent care.  The patient was initially brought in by her sister and had been responding to internal stimuli and cannot communicate.  She was deemed to meet criteria for inpatient services due to concern for hallucinations, responding to internal stimuli, concern for decompensated schizophrenia.   On arrival, the patient was stable.  Vitals reviewed.  Physical exam unremarkable.  Patient uncooperative with exam, IVC for decompensated schizophrenia.  Screening labs significant for mild hypokalemia to 3.3, replenished orally, mild hyponatremia with a sodium of 148.  We will plan for oral rehydration.  Remainder of laboratory work-up unremarkable.  The patient is considered medically cleared at this time.  Plan for TTS evaluation.  Final Clinical Impression(s) / ED Diagnoses Final diagnoses:  Hallucinations  Paranoid schizophrenia [facility])    Rx / DC Orders ED Discharge Orders     None        Ernie Avena, MD 01/06/21 1745

## 2021-01-06 NOTE — ED Triage Notes (Signed)
Patient was discharged from here to [facility] and bc she was not IVC'd and couldn't sign they dicharged here.  Sister took patient back to [facility] due to patient continuing to be paranoid, speaking only in one word sentences and not eating or drinking.  Hx schizophrenia.  Patient had med changes at [facility] 2 weeks ago and per sister patient has decompensated since then.

## 2021-01-06 NOTE — ED Notes (Signed)
Pt changed out into burgundy scrubs. Two belongings bags placed in 19-22 cabinet

## 2021-01-07 DIAGNOSIS — Z91199 Patient's noncompliance with other medical treatment and regimen due to unspecified reason: Secondary | ICD-10-CM | POA: Diagnosis not present

## 2021-01-07 DIAGNOSIS — I10 Essential (primary) hypertension: Secondary | ICD-10-CM | POA: Diagnosis not present

## 2021-01-07 DIAGNOSIS — E878 Other disorders of electrolyte and fluid balance, not elsewhere classified: Secondary | ICD-10-CM | POA: Diagnosis not present

## 2021-01-07 DIAGNOSIS — E860 Dehydration: Secondary | ICD-10-CM | POA: Diagnosis not present

## 2021-01-07 DIAGNOSIS — Z882 Allergy status to sulfonamides status: Secondary | ICD-10-CM | POA: Diagnosis not present

## 2021-01-07 DIAGNOSIS — F600 Paranoid personality disorder: Secondary | ICD-10-CM | POA: Diagnosis not present

## 2021-01-07 DIAGNOSIS — G9340 Encephalopathy, unspecified: Secondary | ICD-10-CM | POA: Diagnosis not present

## 2021-01-07 DIAGNOSIS — E876 Hypokalemia: Secondary | ICD-10-CM | POA: Diagnosis not present

## 2021-01-07 DIAGNOSIS — Z79899 Other long term (current) drug therapy: Secondary | ICD-10-CM | POA: Diagnosis not present

## 2021-01-07 DIAGNOSIS — F200 Paranoid schizophrenia: Secondary | ICD-10-CM | POA: Diagnosis not present

## 2021-01-07 DIAGNOSIS — E7800 Pure hypercholesterolemia, unspecified: Secondary | ICD-10-CM | POA: Diagnosis not present

## 2021-01-07 DIAGNOSIS — F209 Schizophrenia, unspecified: Secondary | ICD-10-CM | POA: Diagnosis not present

## 2021-01-07 DIAGNOSIS — R404 Transient alteration of awareness: Secondary | ICD-10-CM | POA: Diagnosis not present

## 2021-01-07 DIAGNOSIS — F29 Unspecified psychosis not due to a substance or known physiological condition: Secondary | ICD-10-CM | POA: Diagnosis not present

## 2021-01-07 DIAGNOSIS — F250 Schizoaffective disorder, bipolar type: Secondary | ICD-10-CM | POA: Diagnosis not present

## 2021-01-07 DIAGNOSIS — Z888 Allergy status to other drugs, medicaments and biological substances status: Secondary | ICD-10-CM | POA: Diagnosis not present

## 2021-01-07 DIAGNOSIS — Z9114 Patient's other noncompliance with medication regimen: Secondary | ICD-10-CM | POA: Diagnosis not present

## 2021-01-07 DIAGNOSIS — Z743 Need for continuous supervision: Secondary | ICD-10-CM | POA: Diagnosis not present

## 2021-01-07 DIAGNOSIS — E870 Hyperosmolality and hypernatremia: Secondary | ICD-10-CM | POA: Diagnosis not present

## 2021-01-07 NOTE — ED Notes (Signed)
This RN spoke with family at bedside regarding patient bed situation. Family unhappy that patient is still here and is in a hall way bed. This RN tried to explain situation to family.

## 2021-01-07 NOTE — ED Notes (Signed)
Sheriff dept called for transport needs.  ?

## 2021-01-07 NOTE — ED Notes (Signed)
Attempted to give report to [facility], staff states they are unable to take report until Sheriffs are here to get patient.

## 2021-01-07 NOTE — BH Assessment (Signed)
[facility] Assessment Progress Note   Per Nelly Rout, MD, this pt requires psychiatric hospitalization at this time.  Pt presents under IVC initiated by Dr Lucianne Muss.  At 10:58 [name] calls from [facility] to report that pt has been accepted to [facility] by Dr Althea Grimmer.  Caryn Bee, NP concurs with this decision.  EDP Rolan Bucco, MD and pt's nurse, Abby, have been notified, and Abby agrees to call report to [phone].  Pt is to be transported via [facility].  Doylene Canning Behavioral Health Coordinator [phone]

## 2021-01-10 DIAGNOSIS — E876 Hypokalemia: Secondary | ICD-10-CM | POA: Diagnosis not present

## 2021-01-10 DIAGNOSIS — E870 Hyperosmolality and hypernatremia: Secondary | ICD-10-CM | POA: Diagnosis not present

## 2021-01-10 DIAGNOSIS — E860 Dehydration: Secondary | ICD-10-CM | POA: Diagnosis not present

## 2021-01-12 ENCOUNTER — Telehealth (HOSPITAL_COMMUNITY): Payer: Self-pay | Admitting: Medical

## 2021-01-12 ENCOUNTER — Telehealth: Payer: Self-pay

## 2021-01-12 DIAGNOSIS — E870 Hyperosmolality and hypernatremia: Secondary | ICD-10-CM | POA: Diagnosis not present

## 2021-01-12 DIAGNOSIS — E876 Hypokalemia: Secondary | ICD-10-CM | POA: Diagnosis not present

## 2021-01-12 DIAGNOSIS — G9340 Encephalopathy, unspecified: Secondary | ICD-10-CM | POA: Diagnosis not present

## 2021-01-12 DIAGNOSIS — E860 Dehydration: Secondary | ICD-10-CM | POA: Diagnosis not present

## 2021-01-12 DIAGNOSIS — F200 Paranoid schizophrenia: Secondary | ICD-10-CM | POA: Diagnosis not present

## 2021-01-12 DIAGNOSIS — Z452 Encounter for adjustment and management of vascular access device: Secondary | ICD-10-CM | POA: Diagnosis not present

## 2021-01-12 NOTE — BH Assessment (Signed)
Care Management - Follow Up [facility] Discharges   Writer attempted to make contact with patient today and was unsuccessful.  Writer left a HIPPA compliant voice message.   Per chart review, patient was placed at [facility] psychiatric hospital.

## 2021-01-12 NOTE — Telephone Encounter (Signed)
Pt. Husband called stating that he is worried about his wife. She had to go to [facility] recently and they transferred to a different facility close to [place]. He wanted to know if there was anything you could do as far as getting her moved to a facility closer to this area. He wanted to talk to you but I told him I would send you a message. His phone number is [phone].

## 2021-01-13 DIAGNOSIS — F200 Paranoid schizophrenia: Secondary | ICD-10-CM | POA: Diagnosis not present

## 2021-01-13 DIAGNOSIS — E7800 Pure hypercholesterolemia, unspecified: Secondary | ICD-10-CM | POA: Diagnosis not present

## 2021-01-13 DIAGNOSIS — Z888 Allergy status to other drugs, medicaments and biological substances status: Secondary | ICD-10-CM | POA: Diagnosis not present

## 2021-01-13 DIAGNOSIS — E870 Hyperosmolality and hypernatremia: Secondary | ICD-10-CM | POA: Diagnosis not present

## 2021-01-13 DIAGNOSIS — F209 Schizophrenia, unspecified: Secondary | ICD-10-CM | POA: Diagnosis not present

## 2021-01-13 DIAGNOSIS — R45851 Suicidal ideations: Secondary | ICD-10-CM | POA: Diagnosis not present

## 2021-01-13 DIAGNOSIS — Z79899 Other long term (current) drug therapy: Secondary | ICD-10-CM | POA: Diagnosis not present

## 2021-01-13 DIAGNOSIS — F250 Schizoaffective disorder, bipolar type: Secondary | ICD-10-CM | POA: Diagnosis not present

## 2021-01-13 DIAGNOSIS — Z882 Allergy status to sulfonamides status: Secondary | ICD-10-CM | POA: Diagnosis not present

## 2021-01-13 DIAGNOSIS — I10 Essential (primary) hypertension: Secondary | ICD-10-CM | POA: Diagnosis not present

## 2021-01-13 DIAGNOSIS — E860 Dehydration: Secondary | ICD-10-CM | POA: Diagnosis not present

## 2021-01-13 DIAGNOSIS — G9340 Encephalopathy, unspecified: Secondary | ICD-10-CM | POA: Diagnosis not present

## 2021-01-14 NOTE — Telephone Encounter (Signed)
Pt. Called back and he is aware of all of your recommendations. He said they have moved her now to somewhere near [place] now and are trying to get her moved back to where she originally started at. He said thank you for the info.

## 2021-01-14 NOTE — Telephone Encounter (Signed)
Called pt. Husband left detailed message with all the info you provided and also told him to call back if he needed to.

## 2021-01-26 ENCOUNTER — Ambulatory Visit (INDEPENDENT_AMBULATORY_CARE_PROVIDER_SITE_OTHER): Payer: BC Managed Care – PPO | Admitting: Medical

## 2021-01-26 ENCOUNTER — Telehealth: Payer: Self-pay | Admitting: Medical

## 2021-01-26 ENCOUNTER — Other Ambulatory Visit: Payer: Self-pay

## 2021-01-26 VITALS — BP 130/80 | HR 67 | Wt 184.8 lb

## 2021-01-26 DIAGNOSIS — N3281 Overactive bladder: Secondary | ICD-10-CM | POA: Diagnosis not present

## 2021-01-26 DIAGNOSIS — F323 Major depressive disorder, single episode, severe with psychotic features: Secondary | ICD-10-CM | POA: Diagnosis not present

## 2021-01-26 DIAGNOSIS — I10 Essential (primary) hypertension: Secondary | ICD-10-CM

## 2021-01-26 DIAGNOSIS — Z9289 Personal history of other medical treatment: Secondary | ICD-10-CM | POA: Insufficient documentation

## 2021-01-26 DIAGNOSIS — E785 Hyperlipidemia, unspecified: Secondary | ICD-10-CM

## 2021-01-26 DIAGNOSIS — F200 Paranoid schizophrenia: Secondary | ICD-10-CM | POA: Diagnosis not present

## 2021-01-26 DIAGNOSIS — Z79899 Other long term (current) drug therapy: Secondary | ICD-10-CM

## 2021-01-26 MED ORDER — SOLIFENACIN SUCCINATE 10 MG PO TABS: 10.0000 mg | ORAL_TABLET | Freq: Every day | ORAL | 2 refills | Status: DC

## 2021-01-26 MED ORDER — PREGABALIN 75 MG PO CAPS: 75.0000 mg | ORAL_CAPSULE | Freq: Two times a day (BID) | ORAL | 2 refills | Status: DC

## 2021-01-26 NOTE — Patient Instructions (Signed)
We are going to get records from your emergency visit in [facility] where they did a bunch of labs and evaluation  For now discontinue these: Zetia Pravastatin Spironolactone Levothyroxine   Continue any medication that psychiatry wanted you to stay on (fluoxetine, Trazodone, Zyprexa, ) Consider 1/2 tablet (25mg ) Trazodone daily if you are waking up groggy.   Continue Amlodipine 10mg  blood pressure medication Continue Crestor /Rosuvastatin 10mg  cholesterol medication Continue Potassium Chloride daily for low potassium Continue Vesicare for overactive bladder Continue Lyrica/Pregaabalin which is new from the recent hospitaliztion for pains in legs Continue Aspirin 81mg  daily for now for heart disease prevention  I am going to have to review recent labs to decide about Allopurinol gout prevention medication.  This is new per recent hospitalization, but I have not prior gout history on you.

## 2021-01-26 NOTE — Progress Notes (Signed)
Subjective: Chief Complaint  Patient presents with   Hospitalization Follow-up   Here for hospital follow-up.  She is accompanied by her Amy Casey.  She did give consent for me to correspond with her sister in follow-up and follow-up on medications and recheck.  Her Sister Amy Casey provides most of the history.  Amy Casey notes that in the past when she is on her medicine regularly, she tends to do really well.  But when there are periods of time off medicine she does not do well given her history of schizophrenia.  She recently was admitted to [facility] hospital.  She was transferred to [facility] mental health hospital in [place].  Amy Casey notes that she was transferring to [facility] by mental health transport agency, however no papers were sent with her.   When she was initially triaged at [facility], they felt she was medically unstable and did not really know her background so they sent her to [facility] emergency center.  She was evaluated had extensive evaluation.  After initial treatment including improving her potassium which was dangerously low she was transferred back to [facility].  She was transported back and forth between medical centers 3 different times after her recent mental health hospitalization at [facility], at 1 point recently she was simply put in a cab and sent back to her home.  Amy Casey feels like Amy Casey did a great job evaluate her and helping to resolve some underlying acute abnormalities medically but they felt like her mental health treatment was not ideal.  Her husband had called in here when she was in [place] trying to figure out a way to get her back to the [place] area closer to home so he could be involved in her care.  Currently she is doing relatively okay.  She does have follow-up with [facility] this week.  The family and Amy Casey are concerned about her medications.  Several new medications were added.  Other  medications were discontinued so they are not really sure what she should be on  They do have a bag of medications with them today.   Past Medical History:  Diagnosis Date   Depression    History of echocardiogram 2011   [facility]   History of renal stone    Hyperlipidemia    Hypertension    Hypokalemia    Nephrolithiasis    Obesity    Schizophrenia (HCC)    Wears glasses    Current Outpatient Medications on File Prior to Visit  Medication Sig Dispense Refill   amLODipine (NORVASC) 10 MG tablet TAKE 1 TABLET BY MOUTH ONCE DAILY. APPOINTMENT REQUIRED FOR FUTURE REFILLS. (Patient taking differently: Take 10 mg by mouth in the morning.) 90 tablet 3   aspirin EC 81 MG tablet Take 81 mg by mouth daily. Swallow whole.     FLUoxetine (PROZAC) 20 MG capsule Take 1 capsule (20 mg total) by mouth daily. 30 capsule 0   OLANZapine (ZYPREXA) 10 MG tablet Take 10 mg by mouth at bedtime.     potassium chloride (KLOR-CON) 10 MEQ tablet Take 1 tablet (10 mEq total) by mouth daily. 30 tablet 2   rosuvastatin (CRESTOR) 10 MG tablet Take 1 tablet (10 mg total) by mouth daily. 90 tablet 3   traZODone (DESYREL) 50 MG tablet Take 50 mg by mouth at bedtime as needed.     No current facility-administered medications on file prior to visit.    ROS as in subjective  Objective: BP 130/80   Pulse 67   Wt 184 lb 12.8 oz (83.8 kg)   SpO2 98%   BMI 26.52 kg/m   General: Well-developed well-nourished, no acute distress Psych: Pleasant, answering questions today, more calm and collected today than last visit    Assessment: Encounter Diagnoses  Name Primary?   Psychogenic depressive psychosis (HCC) Yes   Paranoid schizophrenia (HCC)    Overactive bladder    Hyperlipidemia, unspecified hyperlipidemia type    High risk medication use    Essential hypertension    History of recent hospitalization        Plan: We discussed her recent mental health hospitalization as well as medical hospitalization  including recent problems with hypokalemia, dehydration, acute crisis with schizophrenia and paranoia.  Unfortunately she bounced around back-and-forth between facilities over the last several weeks.  I strongly recommended call and help her sister to get established with a psychiatrist that she sees every 3 months with consistency and consistency with provider.  She currently sees [facility] psychiatry and sees a different provider usually every time with less consistency.  Also recommended follow-up be more involved in her sister's care to ensure compliance of medications and be an advocate for her sister.  We will request records from her recent hospitalization in [facility] area including recent labs and evaluation.  After reviewing her discharge paperwork, there were several new medications with the suggestion of some new diagnoses she has not had before.  There are medicines added recently for thyroid and gout issues which she has not had in the past.  There were new medicines for edema including spironolactone.  She has not had those issues prior.  So it is presumed that maybe there was some abnormal findings at the hospital evaluation  Prior to her October hospitalization her medications include Prozac 20 mg daily, amlodipine 10 mg daily, Zyprexa 20 mg daily, Crestor 10 mg daily, and Abilify 5 mg daily  With her recent hospitalization she came back with medications for thyroid, allopurinol 100 mg daily, spironolactone, Vesicare, Lyrica, Zetia, pravastatin (although she was already on Crestor).  She has noticed a benefit with Lyrica and Vesicare so we will continue these new medications.  We will help to schedule her with psychiatry.    We discussed the following instructions that were given in the handout as well.  Patient Instructions  We are going to get records from your emergency visit in [facility] where they did a bunch of labs and evaluation  For now discontinue  these: Zetia Pravastatin Spironolactone Levothyroxine   Continue any medication that psychiatry wanted you to stay on (fluoxetine, Trazodone, Zyprexa, ) Consider 1/2 tablet (25mg ) Trazodone daily if you are waking up groggy.   Continue Amlodipine 10mg  blood pressure medication Continue Crestor /Rosuvastatin 10mg  cholesterol medication Continue Potassium Chloride daily for low potassium Continue Vesicare for overactive bladder Continue Lyrica/Pregaabalin which is new from the recent hospitaliztion for pains in legs Continue Aspirin 81mg  daily for now for heart disease prevention  I am going to have to review recent labs to decide about Allopurinol gout prevention medication.  This is new per recent hospitalization, but I have not prior gout history on you.       Amy Casey was seen today for hospitalization follow-up.  Diagnoses and all orders for this visit:  Psychogenic depressive psychosis (HCC)  Paranoid schizophrenia (HCC)  Overactive bladder  Hyperlipidemia, unspecified hyperlipidemia type  High risk medication use  Essential hypertension  History of recent  hospitalization  Other orders -     solifenacin (VESICARE) 10 MG tablet; Take 1 tablet (10 mg total) by mouth daily. -     pregabalin (LYRICA) 75 MG capsule; Take 1 capsule (75 mg total) by mouth 2 (two) times daily.  Spent > 45 minutes face to face with patient in discussion of symptoms, evaluation, plan and recommendations.    F/u pending call back, recheck 81mo with me here.

## 2021-01-26 NOTE — Telephone Encounter (Signed)
Can you call Dr. Roel Cluck office to see if he could get her scheduled as a new patient to see Dr. Evelene Croon.  This patient really needs consistent psychiatric care with the same provider.  Communicate with patient's Sister [name] and Eisa's husband

## 2021-01-27 NOTE — Telephone Encounter (Signed)
Since Dr. Evelene Croon is so booked out, lets try   Dr. Omelia Blackwater,  [address], [place], [place] [address] Phone: [phone]

## 2021-02-02 NOTE — Telephone Encounter (Signed)
I have put in a referral to [facility] online so you do not have to do anything.

## 2021-02-03 ENCOUNTER — Other Ambulatory Visit: Payer: Self-pay | Admitting: Medical

## 2021-02-03 DIAGNOSIS — Z0000 Encounter for general adult medical examination without abnormal findings: Secondary | ICD-10-CM

## 2021-02-03 DIAGNOSIS — Z1231 Encounter for screening mammogram for malignant neoplasm of breast: Secondary | ICD-10-CM

## 2021-02-23 ENCOUNTER — Ambulatory Visit: Payer: BC Managed Care – PPO | Admitting: Medical

## 2021-02-23 ENCOUNTER — Other Ambulatory Visit: Payer: Self-pay

## 2021-02-23 VITALS — BP 130/72 | HR 81 | Wt 201.4 lb

## 2021-02-23 DIAGNOSIS — I10 Essential (primary) hypertension: Secondary | ICD-10-CM

## 2021-02-23 DIAGNOSIS — E785 Hyperlipidemia, unspecified: Secondary | ICD-10-CM | POA: Diagnosis not present

## 2021-02-23 DIAGNOSIS — G4700 Insomnia, unspecified: Secondary | ICD-10-CM | POA: Insufficient documentation

## 2021-02-23 DIAGNOSIS — Z79899 Other long term (current) drug therapy: Secondary | ICD-10-CM | POA: Diagnosis not present

## 2021-02-23 DIAGNOSIS — R609 Edema, unspecified: Secondary | ICD-10-CM | POA: Insufficient documentation

## 2021-02-23 DIAGNOSIS — R635 Abnormal weight gain: Secondary | ICD-10-CM

## 2021-02-23 DIAGNOSIS — Z131 Encounter for screening for diabetes mellitus: Secondary | ICD-10-CM

## 2021-02-23 DIAGNOSIS — Z7185 Encounter for immunization safety counseling: Secondary | ICD-10-CM

## 2021-02-23 DIAGNOSIS — N3281 Overactive bladder: Secondary | ICD-10-CM

## 2021-02-23 DIAGNOSIS — Z1211 Encounter for screening for malignant neoplasm of colon: Secondary | ICD-10-CM

## 2021-02-23 DIAGNOSIS — R202 Paresthesia of skin: Secondary | ICD-10-CM

## 2021-02-23 DIAGNOSIS — E790 Hyperuricemia without signs of inflammatory arthritis and tophaceous disease: Secondary | ICD-10-CM

## 2021-02-23 DIAGNOSIS — F323 Major depressive disorder, single episode, severe with psychotic features: Secondary | ICD-10-CM

## 2021-02-23 DIAGNOSIS — F200 Paranoid schizophrenia: Secondary | ICD-10-CM

## 2021-02-23 DIAGNOSIS — Z23 Encounter for immunization: Secondary | ICD-10-CM | POA: Diagnosis not present

## 2021-02-23 DIAGNOSIS — I679 Cerebrovascular disease, unspecified: Secondary | ICD-10-CM

## 2021-02-23 NOTE — Progress Notes (Signed)
Subjective: Chief Complaint  Patient presents with   Follow-up    4 week follow-up    Here today with sister [name] for 1 month follow up.  She notes bilat leg swelling for months, worse on left.   Sometimes the legs are achy.  No injury.   Fell few days but didn't hurt legs.   Got knocked down at work accidentally by her supervisor as they were rounding a corner at [employer].  Bumped left elbow a little.  She notes weight gain in the past month.   Denies SOB, chest pain.    Hasn't seen psychiatry since last visit.  Had a phone call for appt, but was going back and forth with psychiatry appt scheduling.   Is compliant with medications the same from last visit after leaving hospital.   She says there was an issue at the pharmacy since last visit.  They did have some of the medicine that was prescribed last time but she is not sure which.  She still has plenty of her prozac, olanzapine and trazodone.  She is taking half tablet of trazodone nightly for sleep instead of a whole tablet  Last visit she was here for hospitalization f/u from recent psychiatric admission.  She is back to work since last visit, doing ok at work.    Past Medical History:  Diagnosis Date   Depression    History of echocardiogram 2011   [facility]   History of renal stone    Hyperlipidemia    Hypertension    Hypokalemia    Nephrolithiasis    Obesity    Schizophrenia (HCC)    Wears glasses    Current Outpatient Medications on File Prior to Visit  Medication Sig Dispense Refill   amLODipine (NORVASC) 10 MG tablet TAKE 1 TABLET BY MOUTH ONCE DAILY. APPOINTMENT REQUIRED FOR FUTURE REFILLS. (Patient taking differently: Take 10 mg by mouth in the morning.) 90 tablet 3   FLUoxetine (PROZAC) 20 MG capsule Take 1 capsule (20 mg total) by mouth daily. 30 capsule 0   OLANZapine (ZYPREXA) 10 MG tablet Take 10 mg by mouth at bedtime.     pregabalin (LYRICA) 75 MG capsule Take 1 capsule (75 mg total) by mouth 2 (two) times  daily. 60 capsule 2   rosuvastatin (CRESTOR) 10 MG tablet Take 1 tablet (10 mg total) by mouth daily. 90 tablet 3   solifenacin (VESICARE) 10 MG tablet Take 1 tablet (10 mg total) by mouth daily. 30 tablet 2   traZODone (DESYREL) 50 MG tablet Take 50 mg by mouth at bedtime as needed.     No current facility-administered medications on file prior to visit.    ROS as in subjective    Objective: BP 130/72   Pulse 81   Wt 201 lb 6.4 oz (91.4 kg)   BMI 28.90 kg/m   Wt Readings from Last 3 Encounters:  02/23/21 201 lb 6.4 oz (91.4 kg)  01/26/21 184 lb 12.8 oz (83.8 kg)  01/06/21 184 lb (83.5 kg)    General: Well-developed well-nourished, no acute distress Psych: Pleasant, chatty today Lungs clear Heart rrr, normal s1, s2, no murmurs Abdomen: +bs, soft, nontender, no mass Bilateral lower extremity swelling, worse on the left, significantly different on the left, 1+ pitting edema both lower extremities no palpable cord no frank calf tenderness Negative Homans Pulses upper and lower extremity normal Normal LE strength and sensation, and DTRs    Assessment: Encounter Diagnoses  Name Primary?   Essential hypertension  Yes   High risk medication use    Hyperlipidemia, unspecified hyperlipidemia type    Overactive bladder    Paranoid schizophrenia (HCC)    Psychogenic depressive psychosis (HCC)    Cerebrovascular disease    Elevated uric acid in blood    Weight gain    Edema, unspecified type    Screening for diabetes mellitus    Need for influenza vaccination    Vaccine counseling    Need for pneumococcal vaccination    Insomnia, unspecified type         Plan: Fortunately she is back on her medicines and seems to be doing well on the current regimen of olanzapine 10 mg daily, fluoxetine 20 mg daily.  We are still working to try to get her into psychiatry.  Last visit we made multiple attempts to schedule her with psychiatry but ran to lots of roadblocks at every turn.   Some psychiatrist were scheduling out to April 2023, some were not at that finger for insurance, we are still working to get her in with somebody to establish care  Insomnia-continue trazodone half tablet or 25 mg daily  Overactive bladder-continue Vesicare.  We will call pharmacy to verify that they have the prescription as there was questions about this today  Hyperlipidemia-continue rosuvastatin Crestor  Hypertension-continue amlodipine 10 mg daily  Leg swelling, worse on the left-referral for ultrasound to rule out DVT.  Will likely need to consider vascular surgery consult or abdominal imaging.  Pending labs will likely begin Dyazide.  Other labs today to further evaluate  Prior hospitalization prior to last visit noted concern for uric acid elevation, low potassium and abnormal thyroid labs.  We will recheck these today.  It is not clear that she needs to be on the medication for either these  Paresthesias - continue lyrica for now.  Consider neurology consult going forward.   This is a back burner issue given the other issues for now.  She does seem to be improved in symptoms from hospital on lyrica.   Vaccine counseling: Shingles vaccine:  I recommend you have a shingles vaccine to help prevent shingles or herpes zoster outbreak.   Please call your insurer to inquire about coverage for the Shingrix vaccine given in 2 doses.   Some insurers cover this vaccine after age 18, some cover this after age 65.  If your insurer covers this, then call to schedule appointment to have this vaccine here.  Counseled on the influenza virus vaccine.  Vaccine information sheet given.  Influenza vaccine given after consent obtained.  Counseled on the pneumococcal vaccine.  Vaccine information sheet given.  Pneumococcal vaccine PPSV20 given after consent obtained.   Due for Td vaccine 2023.    Referral for cologuard   Timber was seen today for follow-up.  Diagnoses and all orders for this  visit:  Essential hypertension -     TSH + free T4 -     Comprehensive metabolic panel -     CBC -     Brain natriuretic peptide  High risk medication use -     Hemoglobin A1c  Hyperlipidemia, unspecified hyperlipidemia type -     Hemoglobin A1c  Overactive bladder  Paranoid schizophrenia (HCC)  Psychogenic depressive psychosis (HCC)  Cerebrovascular disease  Elevated uric acid in blood -     Uric acid  Weight gain -     TSH + free T4 -     Comprehensive metabolic panel -  CBC -     Brain natriuretic peptide  Edema, unspecified type -     TSH + free T4 -     Comprehensive metabolic panel -     CBC -     Brain natriuretic peptide -     US Venous Img Lower Bilateral; Future  Screening for diabetes mellitus -     Hemoglobin A1c  Need for influenza vaccination  Vaccine counseling  Need for pneumococcal vaccination  Insomnia, unspecified type    F/u pending labs.

## 2021-02-23 NOTE — Addendum Note (Signed)
Addended by: Herminio Commons A on: 02/23/2021 02:52 PM   Modules accepted: Orders

## 2021-02-24 LAB — URIC ACID: Uric Acid: 4.3 mg/dL (ref 3.0–7.2)

## 2021-02-24 LAB — COMPREHENSIVE METABOLIC PANEL
ALT: 12 IU/L (ref 0–32)
AST: 13 IU/L (ref 0–40)
Albumin/Globulin Ratio: 1.5 (ref 1.2–2.2)
Albumin: 4.3 g/dL (ref 3.8–4.8)
Alkaline Phosphatase: 99 IU/L (ref 44–121)
BUN/Creatinine Ratio: 25 (ref 12–28)
BUN: 21 mg/dL (ref 8–27)
Bilirubin Total: <0.2 mg/dL (ref 0.0–1.2)
CO2: 22 mmol/L (ref 20–29)
Calcium: 9.2 mg/dL (ref 8.7–10.3)
Chloride: 103 mmol/L (ref 96–106)
Creatinine, Ser: 0.83 mg/dL (ref 0.57–1.00)
Globulin, Total: 2.9 g/dL (ref 1.5–4.5)
Glucose: 78 mg/dL (ref 70–99)
Potassium: 4.2 mmol/L (ref 3.5–5.2)
Sodium: 140 mmol/L (ref 134–144)
Total Protein: 7.2 g/dL (ref 6.0–8.5)
eGFR: 80 mL/min/{1.73_m2} (ref >59)

## 2021-02-24 LAB — TSH+FREE T4
Free T4: 1.09 ng/dL (ref 0.82–1.77)
TSH: 1.73 u[IU]/mL (ref 0.450–4.500)

## 2021-02-24 LAB — CBC
Hematocrit: 35.2 % (ref 34.0–46.6)
Hemoglobin: 11.4 g/dL (ref 11.1–15.9)
MCH: 28.8 pg (ref 26.6–33.0)
MCHC: 32.4 g/dL (ref 31.5–35.7)
MCV: 89 fL (ref 79–97)
Platelets: 382 10*3/uL (ref 150–450)
RBC: 3.96 x10E6/uL (ref 3.77–5.28)
RDW: 13.2 % (ref 11.7–15.4)
WBC: 6.6 10*3/uL (ref 3.4–10.8)

## 2021-02-24 LAB — HEMOGLOBIN A1C
Est. average glucose Bld gHb Est-mCnc: 128 mg/dL
Hgb A1c MFr Bld: 6.1 % — ABNORMAL HIGH (ref 4.8–5.6)

## 2021-02-24 LAB — BRAIN NATRIURETIC PEPTIDE: BNP: 23 pg/mL (ref 0.0–100.0)

## 2021-02-26 ENCOUNTER — Ambulatory Visit
Admission: RE | Admit: 2021-02-26 | Discharge: 2021-02-26 | Disposition: A | Discharge status: Home / Self Care | Payer: BC Managed Care – PPO | Source: Ambulatory Visit | Attending: Medical | Admitting: Medical

## 2021-02-26 DIAGNOSIS — R609 Edema, unspecified: Secondary | ICD-10-CM

## 2021-02-26 DIAGNOSIS — M7989 Other specified soft tissue disorders: Secondary | ICD-10-CM | POA: Diagnosis not present

## 2021-02-28 ENCOUNTER — Other Ambulatory Visit: Payer: Self-pay | Admitting: Medical

## 2021-02-28 MED ORDER — TRIAMTERENE-HCTZ 37.5-25 MG PO CAPS: 1.0000 | ORAL_CAPSULE | Freq: Every day | ORAL | 2 refills | Status: DC

## 2021-02-28 NOTE — Progress Notes (Signed)
Rx

## 2021-03-09 ENCOUNTER — Ambulatory Visit
Admission: RE | Admit: 2021-03-09 | Discharge: 2021-03-09 | Disposition: A | Discharge status: Home / Self Care | Payer: BC Managed Care – PPO | Source: Ambulatory Visit | Attending: Medical | Admitting: Medical

## 2021-03-09 ENCOUNTER — Other Ambulatory Visit: Payer: Self-pay

## 2021-03-09 DIAGNOSIS — Z1231 Encounter for screening mammogram for malignant neoplasm of breast: Secondary | ICD-10-CM

## 2021-03-09 DIAGNOSIS — Z0000 Encounter for general adult medical examination without abnormal findings: Secondary | ICD-10-CM

## 2021-03-10 ENCOUNTER — Telehealth: Payer: Self-pay

## 2021-03-10 NOTE — Telephone Encounter (Signed)
Pt informed of mammogram results.  

## 2021-03-15 ENCOUNTER — Emergency Department (HOSPITAL_COMMUNITY)
Admission: EM | Admit: 2021-03-15 | Discharge: 2021-03-16 | Disposition: A | Discharge status: Home / Self Care | Payer: BC Managed Care – PPO | Attending: Emergency Medicine | Admitting: Emergency Medicine

## 2021-03-15 ENCOUNTER — Encounter (HOSPITAL_COMMUNITY): Payer: Self-pay

## 2021-03-15 ENCOUNTER — Emergency Department (HOSPITAL_COMMUNITY): Payer: BC Managed Care – PPO

## 2021-03-15 ENCOUNTER — Other Ambulatory Visit: Payer: Self-pay

## 2021-03-15 DIAGNOSIS — Z79899 Other long term (current) drug therapy: Secondary | ICD-10-CM | POA: Diagnosis not present

## 2021-03-15 DIAGNOSIS — I10 Essential (primary) hypertension: Secondary | ICD-10-CM | POA: Diagnosis not present

## 2021-03-15 DIAGNOSIS — M7989 Other specified soft tissue disorders: Secondary | ICD-10-CM | POA: Insufficient documentation

## 2021-03-15 DIAGNOSIS — N3000 Acute cystitis without hematuria: Secondary | ICD-10-CM | POA: Insufficient documentation

## 2021-03-15 LAB — CBC WITH DIFFERENTIAL/PLATELET
Abs Immature Granulocytes: 0.01 10*3/uL (ref 0.00–0.07)
Basophils Absolute: 0 10*3/uL (ref 0.0–0.1)
Basophils Relative: 1 %
Eosinophils Absolute: 0.5 10*3/uL (ref 0.0–0.5)
Eosinophils Relative: 8 %
HCT: 36.9 % (ref 36.0–46.0)
Hemoglobin: 11.4 g/dL — ABNORMAL LOW (ref 12.0–15.0)
Immature Granulocytes: 0 %
Lymphocytes Relative: 35 %
Lymphs Abs: 2.2 10*3/uL (ref 0.7–4.0)
MCH: 29.1 pg (ref 26.0–34.0)
MCHC: 30.9 g/dL (ref 30.0–36.0)
MCV: 94.1 fL (ref 80.0–100.0)
Monocytes Absolute: 0.7 10*3/uL (ref 0.1–1.0)
Monocytes Relative: 10 %
Neutro Abs: 2.9 10*3/uL (ref 1.7–7.7)
Neutrophils Relative %: 46 %
Platelets: 362 10*3/uL (ref 150–400)
RBC: 3.92 MIL/uL (ref 3.87–5.11)
RDW: 14.3 % (ref 11.5–15.5)
WBC: 6.4 10*3/uL (ref 4.0–10.5)
nRBC: 0 % (ref 0.0–0.2)

## 2021-03-15 LAB — URINALYSIS, ROUTINE W REFLEX MICROSCOPIC
Bilirubin Urine: NEGATIVE
Glucose, UA: NEGATIVE mg/dL
Ketones, ur: NEGATIVE mg/dL
Nitrite: POSITIVE — AB
Protein, ur: NEGATIVE mg/dL
Specific Gravity, Urine: 1.025 (ref 1.005–1.030)
pH: 5.5 (ref 5.0–8.0)

## 2021-03-15 LAB — URINALYSIS, MICROSCOPIC (REFLEX)

## 2021-03-15 NOTE — ED Notes (Signed)
No answer in lobby for labs

## 2021-03-15 NOTE — ED Provider Notes (Signed)
Emergency Medicine Provider Triage Evaluation Note  Amy Casey , a 60 y.o. female  was evaluated in triage.  Pt complains of left leg swelling.  She has had this going on for several months. She is seen by her PCP and had an ultrasound that ruled out DVT.  She is just concerned because it is painful she feels like her leg is weak.  Denies chest pain or shortness of breath..  Review of Systems  Positive: Leg swelling Negative: Fever or chills  Physical Exam  BP 111/67 (BP Location: Right Arm)    Pulse 84    Temp 98 F (36.7 C) (Oral)    Resp 17    Ht 5' 10.5" (1.791 m)    Wt 92.5 kg    SpO2 100%    BMI 28.86 kg/m  Gen:   Awake, no distress   Resp:  Normal effort  MSK:   Moves extremities without difficulty  Other:  Bilateral leg swelling left greater than right  Medical Decision Making  Medically screening exam initiated at 6:48 PM.  Appropriate orders placed.  Amy Casey was informed that the remainder of the evaluation will be completed by another provider, this initial triage assessment does not replace that evaluation, and the importance of remaining in the ED until their evaluation is complete.  DP pulses 2+.  Imaging and labs ordered   Arthor Captain, Cordelia Poche 03/15/21 1849    Pollyann Savoy, MD 03/15/21 [phone]

## 2021-03-15 NOTE — ED Triage Notes (Signed)
Pt arrived POV c/o left leg swelling that has been going on for months, but the last 2 days she felt like it was worse and that she is having pain.

## 2021-03-16 LAB — COMPREHENSIVE METABOLIC PANEL
ALT: 14 U/L (ref 0–44)
AST: 18 U/L (ref 15–41)
Albumin: 3.7 g/dL (ref 3.5–5.0)
Alkaline Phosphatase: 70 U/L (ref 38–126)
Anion gap: 8 (ref 5–15)
BUN: 31 mg/dL — ABNORMAL HIGH (ref 8–23)
CO2: 24 mmol/L (ref 22–32)
Calcium: 8.9 mg/dL (ref 8.9–10.3)
Chloride: 108 mmol/L (ref 98–111)
Creatinine, Ser: 1.06 mg/dL — ABNORMAL HIGH (ref 0.44–1.00)
GFR, Estimated: 59 mL/min — ABNORMAL LOW (ref >60)
Glucose, Bld: 114 mg/dL — ABNORMAL HIGH (ref 70–99)
Potassium: 3.7 mmol/L (ref 3.5–5.1)
Sodium: 140 mmol/L (ref 135–145)
Total Bilirubin: 0.6 mg/dL (ref 0.3–1.2)
Total Protein: 7.3 g/dL (ref 6.5–8.1)

## 2021-03-16 MED ORDER — ACETAMINOPHEN 500 MG PO TABS: 500.0000 mg | ORAL_TABLET | Freq: Four times a day (QID) | ORAL | 0 refills | Status: DC | PRN

## 2021-03-16 MED ORDER — CEPHALEXIN 500 MG PO CAPS: 500.0000 mg | ORAL_CAPSULE | Freq: Two times a day (BID) | ORAL | 0 refills | Status: AC

## 2021-03-16 MED ORDER — IBUPROFEN 600 MG PO TABS: 600.0000 mg | ORAL_TABLET | Freq: Four times a day (QID) | ORAL | 0 refills | Status: DC | PRN

## 2021-03-16 MED ORDER — DICLOFENAC SODIUM 1 % EX GEL: 2.0000 g | Freq: Four times a day (QID) | CUTANEOUS | 0 refills | Status: DC

## 2021-03-16 NOTE — Discharge Instructions (Addendum)
Your x-ray of your left knee was negative for fracture or dislocation.  It did show fluid on the knee.  This can be treated with an Ace wrap, elevating your left leg, applying ice to the area for up to 15 minutes at a time.  Ensure to place a barrier between your skin and the ice.    Call your primary care provider, Amy Oyster, PA-C and inform them that you were evaluated in the ED for left knee pain and left leg swelling.  Your x-ray did not show a fracture but it did show an effusion.  You are being treated with Ace wrap, ice, and NSAIDs.  Call the vascular surgeons (Dr. Edilia Bo) office to schedule follow-up appointment regarding today's visit.  You may inform them that you were evaluated at her primary care provider office and ED for left leg pain and swelling.  You did not have a DVT on your ultrasound.  Your x-ray was negative for fracture.  You may follow-up with your primary care provider as needed.  Attached is information for the vascular surgeon office, call them and inform of your ED visit for further evaluation of your symptoms.  Return to the ED if you are experiencing increasing/worsening leg pain, color change, inability to walk, or worsening symptoms.

## 2021-03-16 NOTE — ED Provider Notes (Signed)
[facility] EMERGENCY DEPARTMENT Provider Note   CSN: [id number] Arrival date & time: 03/15/21  1646     History Chief Complaint  Patient presents with   Leg Swelling    Amy Casey is a 60 y.o. female with a past medical history of hyperlipidemia and hypertension who presents to the ED complaining of left leg swelling onset 3-4 months.  Patient reports her left leg swelling has been ongoing for several months.  Patient reports she has been ambulating with a crutch due to pain.  Patient reports that her leg gave out from pain and she fell with her left leg being bent behind her right leg.  She did not hit her head or pass out. She was seen by her PCP on 02/20/21 and had an ultrasound completed that ruled out DVT on 02/26/2021.  Patient has associated left leg pain.  She has tried her prescription medications with no relief her symptoms.  Patient denies chest pain, shortness of breath, fever, chills, hitting her head, LOC, dizziness, lightheadedness.  Denies history of hormone replacement therapy, OCP, malignancy, DVT/PE, recent immobilization or surgery.  Denies anticoagulant use.   Per patient chart review: Ultrasound of lower bilateral extremities completed on 02/26/2021 negative for lower extremity DVT.   The history is provided by the patient. No language interpreter was used.      Past Medical History:  Diagnosis Date   Depression    History of echocardiogram 2011   [facility]   History of renal stone    Hyperlipidemia    Hypertension    Hypokalemia    Nephrolithiasis    Obesity    Schizophrenia ([place])    Wears glasses     Patient Active Problem List   Diagnosis Date Noted   Need for influenza vaccination 02/23/2021   Screening for diabetes mellitus 02/23/2021   Edema 02/23/2021   Weight gain 02/23/2021   Elevated uric acid in blood 02/23/2021   Need for pneumococcal vaccination 02/23/2021   Insomnia 02/23/2021   Screen for colon cancer 02/23/2021    Paresthesia 02/23/2021   History of recent hospitalization 01/26/2021   Cerebrovascular disease 12/31/2020   Vaccine refused by patient 12/31/2020   Hyperlipidemia 12/31/2020   Abnormal EKG 12/31/2020   Overactive bladder 07/22/2019   Vaccine counseling 07/22/2019   H/O positive serological reaction for syphilis 05/24/2019   Essential hypertension 11/06/2018   Encounter for health maintenance examination in adult 08/21/2018   Catatonia 08/16/2018   Psychogenic depressive psychosis ([place]) 08/15/2018   High risk medication use 07/25/2016   Involuntary commitment    Paranoid schizophrenia ([place]) 06/16/2015   Noncompliance 06/16/2015    Past Surgical History:  Procedure Laterality Date   LUMBAR PUNCTURE  2015   WISDOM TOOTH EXTRACTION       OB History   No obstetric history on file.     Family History  Problem Relation Age of Onset   Diabetes Mother    Heart disease Mother    Chronic Renal Failure Mother    Cancer Maternal Uncle        lung   Kidney disease Mother        dialysis   Stroke Neg Hx    Hypertension Neg Hx    Hyperlipidemia Neg Hx     Social History   Tobacco Use   Smoking status: Never   Smokeless tobacco: Never  Vaping Use   Vaping Use: Never used  Substance Use Topics   Alcohol use: No  Drug use: No    Home Medications Prior to Admission medications   Medication Sig Start Date End Date Taking? Authorizing Provider  acetaminophen (TYLENOL) 500 MG tablet Take 1 tablet (500 mg total) by mouth every 6 (six) hours as needed. 03/16/21  Yes Decklyn Hyder A, PA-C  cephALEXin (KEFLEX) 500 MG capsule Take 1 capsule (500 mg total) by mouth 2 (two) times daily for 7 days. 03/16/21 03/23/21 Yes Sanora Cunanan A, PA-C  diclofenac Sodium (VOLTAREN) 1 % GEL Apply 2 g topically 4 (four) times daily. 03/16/21  Yes Denisa Enterline A, PA-C  amLODipine (NORVASC) 10 MG tablet TAKE 1 TABLET BY MOUTH ONCE DAILY. APPOINTMENT REQUIRED FOR FUTURE REFILLS. Patient taking  differently: Take 10 mg by mouth in the morning. 01/01/21   Tysinger, Kermit Balo, PA-C  FLUoxetine (PROZAC) 20 MG capsule Take 1 capsule (20 mg total) by mouth daily. 05/05/19 02/23/21  Hughie Closs, MD  OLANZapine (ZYPREXA) 10 MG tablet Take 10 mg by mouth at bedtime. 01/21/21   [provider]  pregabalin (LYRICA) 75 MG capsule Take 1 capsule (75 mg total) by mouth 2 (two) times daily. 01/26/21   Tysinger, Kermit Balo, PA-C  rosuvastatin (CRESTOR) 10 MG tablet Take 1 tablet (10 mg total) by mouth daily. 01/01/21 01/01/22  Tysinger, Kermit Balo, PA-C  solifenacin (VESICARE) 10 MG tablet Take 1 tablet (10 mg total) by mouth daily. 01/26/21   Tysinger, Kermit Balo, PA-C  traZODone (DESYREL) 50 MG tablet Take 50 mg by mouth at bedtime as needed. 01/21/21   [provider]  triamterene-hydrochlorothiazide (DYAZIDE) 37.5-25 MG capsule Take 1 each (1 capsule total) by mouth daily. 02/28/21 02/28/22  Tysinger, Kermit Balo, PA-C    Allergies    Paliperidone, Lisinopril, and Sulfa antibiotics  Review of Systems   Review of Systems  Constitutional:  Negative for chills and fever.  Respiratory:  Negative for shortness of breath.   Cardiovascular:  Positive for leg swelling. Negative for chest pain.  Musculoskeletal:  Positive for arthralgias and joint swelling.  Skin:  Negative for rash.  Neurological:  Negative for dizziness, syncope and light-headedness.  All other systems reviewed and are negative.  Physical Exam Updated Vital Signs BP 125/80    Pulse 79    Temp 97.8 F (36.6 C) (Oral)    Resp 19    Ht 5' 10.5" (1.791 m)    Wt 92.5 kg    SpO2 100%    BMI 28.86 kg/m   Physical Exam Vitals and nursing note reviewed.  Constitutional:      General: She is not in acute distress.    Appearance: She is not diaphoretic.  HENT:     Head: Normocephalic and atraumatic.     Mouth/Throat:     Pharynx: No oropharyngeal exudate.  Eyes:     General: No scleral icterus.    Conjunctiva/sclera: Conjunctivae  normal.  Cardiovascular:     Rate and Rhythm: Normal rate and regular rhythm.     Pulses: Normal pulses.     Heart sounds: Normal heart sounds.  Pulmonary:     Effort: Pulmonary effort is normal. No respiratory distress.     Breath sounds: Normal breath sounds. No wheezing.  Chest:     Chest wall: No tenderness.  Abdominal:     General: Bowel sounds are normal.     Palpations: Abdomen is soft. There is no mass.     Tenderness: There is no abdominal tenderness. There is no right CVA tenderness, left CVA tenderness,  guarding or rebound.  Musculoskeletal:        General: Normal range of motion.     Cervical back: Normal range of motion and neck supple.     Right knee: Normal.     Left knee: Effusion present. No deformity, lacerations or bony tenderness. Normal range of motion. Tenderness present.     Right lower leg: Swelling present. No deformity, lacerations, tenderness or bony tenderness.     Left lower leg: Swelling present. No deformity, lacerations, tenderness or bony tenderness.     Comments: Strength and sensation intact to bilateral lower extremities. DP and PT pulses intact bilaterally. No overlying erythema. Effusion noted to medial left knee. Full active ROM of bilateral knees.  Negative Homans' sign bilaterally.  No overlying skin changes.  Able to ambulate without assistance.  No C, T, L, S spinal tenderness to palpation.  No musculature tenderness to palpation noted.  Skin:    General: Skin is warm and dry.  Neurological:     Mental Status: She is alert.  Psychiatric:        Behavior: Behavior normal.    ED Results / Procedures / Treatments   Labs (all labs ordered are listed, but only abnormal results are displayed) Labs Reviewed  COMPREHENSIVE METABOLIC PANEL - Abnormal; Notable for the following components:      Result Value   Glucose, Bld 114 (*)    BUN 31 (*)    Creatinine, Ser 1.06 (*)    GFR, Estimated 59 (*)    All other components within normal limits   CBC WITH DIFFERENTIAL/PLATELET - Abnormal; Notable for the following components:   Hemoglobin 11.4 (*)    All other components within normal limits  URINALYSIS, ROUTINE W REFLEX MICROSCOPIC - Abnormal; Notable for the following components:   APPearance HAZY (*)    Hgb urine dipstick TRACE (*)    Nitrite POSITIVE (*)    Leukocytes,Ua SMALL (*)    All other components within normal limits  URINALYSIS, MICROSCOPIC (REFLEX) - Abnormal; Notable for the following components:   Bacteria, UA MANY (*)    All other components within normal limits    EKG None  Radiology DG Knee Complete 4 Views Left  Result Date: 03/15/2021 CLINICAL DATA:  Left leg pain and swelling x1 month. EXAM: LEFT KNEE - COMPLETE 4+ VIEW COMPARISON:  None. FINDINGS: No evidence of acute fracture or dislocation. A small chronic appearing deformity is seen adjacent to the medial epicondyle of the distal left femur. No evidence of arthropathy or other focal bone abnormality. A small to moderate sized joint effusion is noted. IMPRESSION: 1. Small to moderate sized joint effusion. 2. No acute fracture or dislocation. Electronically Signed   By: Virgina Norfolk M.D.   On: 03/15/2021 19:49    Procedures Procedures   Medications Ordered in ED Medications - No data to display  ED Course  I have reviewed the triage vital signs and the nursing notes.  Pertinent labs & imaging results that were available during my care of the patient were reviewed by me and considered in my medical decision making (see chart for details).  Clinical Course as of 03/16/21 [id number]  Tue Mar 16, 2021  0745 Case discussed with attending who agrees with discharge treatment plan. [SB]  0745 Patient reevaluated prior to discharge and notified of discharge treatment plan consistent of treating UTI and providing information for on-call vascular surgeon for follow-up today symptoms.  Patient agreeable at this time.  Patient appears safe  for discharge. [SB]     Clinical Course User Index [SB] Shequilla Goodgame A, PA-C   MDM Rules/Calculators/A&P                         Patient presents to the ED with left lower leg swelling ongoing for several months.  She has been evaluated by her primary care provider and worked up for a DVT with a negative ultrasound study on 02/26/2021.  Vital signs stable, patient afebrile, patient not tachycardic or hypoxic.  On exam patient with full active range of motion of bilateral knees and ankles.  Distal pulses intact bilaterally.  Effusion noted to medial left knee.  Negative Homans' sign bilaterally.  Able to ambulate without assistance or difficulty.  Otherwise no acute cardiovascular, respiratory, or abdominal exam findings.  Differential diagnosis includes DVT, cellulitis, fracture, effusion.  CBC and CMP unremarkable.  Urinalysis notable for UTI.  Left knee x-ray notable for small to moderate joint effusion.  No acute fracture or dislocation.  Patient afebrile, no elevated white count, less likely cellulitis at this time.  Doubt DVT at this time, patient without risk factors, patient with recent negative DVT study, no palpable cords or tenderness to palpation along venous system of bilateral lower extremities.  Doubt fracture, patient able to walk without assistance or difficulty, no evidence of fracture on x-ray. This is likely left knee effusion, which is consistent with patient mechanism of injury 2 days ago of left leg going behind right due to increasing pain.  Will provide Ace wrap with instructions on RICE therapy and prescription for ibuprofen.  Patient will be treated with prescription Tylenol, Voltaren gel, and Ace wrap.  Patient instructed on RICE therapy.  Patient also with urinary frequency ongoing for 3 weeks.  Patient without dysuria, hematuria, abdominal pain, nausea or vomiting.  Patient afebrile.  Differential diagnosis includes acute cystitis, pyelonephritis, nephrolithiasis.  No CVA tenderness bilaterally.  No  elevated white count, patient afebrile less likely pyelonephritis or nephrolithiasis at this time.  Urinalysis obtained and notable for UTI.  We will treat patient with prescription Keflex.  Supportive care measures and strict return precautions discussed with patient and husband at bedside.  Both patient and husband acknowledged and verbalized understanding.  Patient appears safe for discharge at this time.  Follow-up as indicated in discharge paperwork.   Final Clinical Impression(s) / ED Diagnoses Final diagnoses:  Leg swelling  Acute cystitis without hematuria    Rx / DC Orders ED Discharge Orders          Ordered    cephALEXin (KEFLEX) 500 MG capsule  2 times daily        03/16/21 0756    ibuprofen (ADVIL) 600 MG tablet  Every 6 hours PRN,   Status:  Discontinued        03/16/21 0756    acetaminophen (TYLENOL) 500 MG tablet  Every 6 hours PRN        03/16/21 0757    diclofenac Sodium (VOLTAREN) 1 % GEL  4 times daily        03/16/21 0757             Syrita Dovel A, PA-C 03/16/21 0951    Maudie Flakes, MD 03/17/21 [phone]

## 2021-03-31 LAB — COLOGUARD

## 2021-04-02 ENCOUNTER — Other Ambulatory Visit: Payer: Self-pay | Admitting: Internal Medicine

## 2021-04-02 DIAGNOSIS — Z1211 Encounter for screening for malignant neoplasm of colon: Secondary | ICD-10-CM

## 2021-04-12 ENCOUNTER — Telehealth: Payer: Self-pay | Admitting: Medical

## 2021-04-12 NOTE — Telephone Encounter (Signed)
Received requested records from [facility]

## 2021-04-15 ENCOUNTER — Encounter: Payer: BC Managed Care – PPO | Admitting: Vascular Surgery

## 2021-04-29 ENCOUNTER — Encounter: Payer: Self-pay | Admitting: Vascular Surgery

## 2021-04-29 ENCOUNTER — Ambulatory Visit (INDEPENDENT_AMBULATORY_CARE_PROVIDER_SITE_OTHER): Payer: Self-pay | Admitting: Vascular Surgery

## 2021-04-29 ENCOUNTER — Other Ambulatory Visit: Payer: Self-pay

## 2021-04-29 VITALS — BP 124/74 | HR 75 | Temp 97.9°F | Resp 20 | Ht 70.5 in | Wt 209.0 lb

## 2021-04-29 DIAGNOSIS — M7989 Other specified soft tissue disorders: Secondary | ICD-10-CM

## 2021-04-29 NOTE — Progress Notes (Signed)
ASSESSMENT & PLAN   CHRONIC VENOUS INSUFFICIENCY: Based on her history and exam I suspect she has an underlying chronic venous insufficiency.  She has not had formal venous reflux testing.  We have discussed the importance of intermittent leg elevation and the proper positioning for this.  I also recommended knee-high compression stockings with a gradient of 15 to 20 mmHg.  I encouraged her to avoid prolonged sitting and standing.  We discussed importance of exercise specifically walking and water aerobics.  We also discussed the importance of maintaining a healthy weight as central obesity especially increases lower extremity venous pressure.  I have ordered a formal venous reflux test of the left leg in 3 months and I will see her back at that time.  If she still having significant problems the only other consideration would be a CT venogram.  She knows to call sooner if she has problems.  REASON FOR CONSULT:    Leg swelling.  The consult is requested by the emergency department.  HPI:   Amy Casey is a 61 y.o. female who was seen in the emergency department on 03/15/2021.  She presented with left leg swelling which had been going on for 3 to 4 months.  Of note she had an ultrasound on 02/26/2021 which showed no evidence of DVT bilaterally.  She has no previous history of DVT.  The patient noted the gradual onset of left leg swelling 3 to 4 months ago.  She describes some aching pain and heaviness in the leg during the day.  Her symptoms are worse at the end of the day.  She also describes pain in her left knee which may be related to arthritis.  She is had no previous history of DVT.  She is had no previous abdominal or inguinal surgery.  She denies any chest pain or shortness of breath.  Past Medical History:  Diagnosis Date   Depression    History of echocardiogram 2011   [facility]   History of renal stone    Hyperlipidemia    Hypertension    Hypokalemia    Nephrolithiasis     Obesity    Schizophrenia ([facility])    Wears glasses     Family History  Problem Relation Age of Onset   Diabetes Mother    Heart disease Mother    Chronic Renal Failure Mother    Cancer Maternal Uncle        lung   Kidney disease Mother        dialysis   Stroke Neg Hx    Hypertension Neg Hx    Hyperlipidemia Neg Hx     SOCIAL HISTORY: Social History   Tobacco Use   Smoking status: Never   Smokeless tobacco: Never  Substance Use Topics   Alcohol use: No    Allergies  Allergen Reactions   Paliperidone Swelling and Other (See Comments)    Angioedema, drooling, slurred speech, ptosis   Lisinopril Swelling, Rash and Other (See Comments)    Angioedema, also    Sulfa Antibiotics Itching    Current Outpatient Medications  Medication Sig Dispense Refill   acetaminophen (TYLENOL) 500 MG tablet Take 1 tablet (500 mg total) by mouth every 6 (six) hours as needed. 30 tablet 0   amLODipine (NORVASC) 10 MG tablet TAKE 1 TABLET BY MOUTH ONCE DAILY. APPOINTMENT REQUIRED FOR FUTURE REFILLS. (Patient taking differently: Take 10 mg by mouth in the morning.) 90 tablet 3   diclofenac Sodium (VOLTAREN) 1 % GEL  Apply 2 g topically 4 (four) times daily. 50 g 0   OLANZapine (ZYPREXA) 10 MG tablet Take 10 mg by mouth at bedtime.     pregabalin (LYRICA) 75 MG capsule Take 1 capsule (75 mg total) by mouth 2 (two) times daily. 60 capsule 2   rosuvastatin (CRESTOR) 10 MG tablet Take 1 tablet (10 mg total) by mouth daily. 90 tablet 3   solifenacin (VESICARE) 10 MG tablet Take 1 tablet (10 mg total) by mouth daily. 30 tablet 2   traZODone (DESYREL) 50 MG tablet Take 50 mg by mouth at bedtime as needed.     triamterene-hydrochlorothiazide (DYAZIDE) 37.5-25 MG capsule Take 1 each (1 capsule total) by mouth daily. 30 capsule 2   FLUoxetine (PROZAC) 20 MG capsule Take 1 capsule (20 mg total) by mouth daily. 30 capsule 0   No current facility-administered medications for this visit.    REVIEW OF  SYSTEMS:  [X]  denotes positive finding, [ ]  denotes negative finding Cardiac  Comments:  Chest pain or chest pressure:    Shortness of breath upon exertion:    Short of breath when lying flat:    Irregular heart rhythm:        Vascular    Pain in calf, thigh, or hip brought on by ambulation: x   Pain in feet at night that wakes you up from your sleep:  x   Blood clot in your veins:    Leg swelling:  x       Pulmonary    Oxygen at home:    Productive cough:     Wheezing:         Neurologic    Sudden weakness in arms or legs:  x   Sudden numbness in arms or legs:     Sudden onset of difficulty speaking or slurred speech:    Temporary loss of vision in one eye:     Problems with dizziness:         Gastrointestinal    Blood in stool:     Vomited blood:         Genitourinary    Burning when urinating:     Blood in urine:        Psychiatric    Major depression:         Hematologic    Bleeding problems:    Problems with blood clotting too easily:        Skin    Rashes or ulcers:        Constitutional    Fever or chills:    -  PHYSICAL EXAM:   Vitals:   04/29/21 0843  BP: 124/74  Pulse: 75  Resp: 20  Temp: 97.9 F (36.6 C)  SpO2: 97%  Weight: 209 lb (94.8 kg)  Height: 5' 10.5" (1.791 m)   Body mass index is 29.56 kg/m. GENERAL: The patient is a well-nourished female, in no acute distress. The vital signs are documented above. CARDIAC: There is a regular rate and rhythm.  VASCULAR: I do not detect carotid bruits. She has palpable pedal pulses. She has mild left lower extremity swelling.  She does not have significant hyperpigmentation at this point.  She does not have any large varicose veins. PULMONARY: There is good air exchange bilaterally without wheezing or rales. ABDOMEN: Soft and non-tender with normal pitched bowel sounds.  MUSCULOSKELETAL: There are no major deformities. NEUROLOGIC: No focal weakness or paresthesias are detected. SKIN: There are  no ulcers or rashes noted.  PSYCHIATRIC: The patient has a normal affect.  DATA:    VENOUS DUPLEX: I did review the venous duplex scan that was done on 02/26/2021.  This showed no evidence of DVT in either lower extremity.  Waverly Ferrari [facility]

## 2021-05-03 ENCOUNTER — Other Ambulatory Visit: Payer: Self-pay | Admitting: *Deleted

## 2021-05-03 DIAGNOSIS — M7989 Other specified soft tissue disorders: Secondary | ICD-10-CM

## 2021-05-25 ENCOUNTER — Other Ambulatory Visit: Payer: Self-pay

## 2021-05-25 ENCOUNTER — Ambulatory Visit: Payer: No Typology Code available for payment source | Admitting: Medical

## 2021-05-25 VITALS — BP 122/70 | HR 77 | Temp 98.8°F | Wt 220.8 lb

## 2021-05-25 DIAGNOSIS — M25562 Pain in left knee: Secondary | ICD-10-CM

## 2021-05-25 DIAGNOSIS — M79605 Pain in left leg: Secondary | ICD-10-CM | POA: Diagnosis not present

## 2021-05-25 DIAGNOSIS — R600 Localized edema: Secondary | ICD-10-CM

## 2021-05-25 DIAGNOSIS — I872 Venous insufficiency (chronic) (peripheral): Secondary | ICD-10-CM | POA: Diagnosis not present

## 2021-05-25 DIAGNOSIS — G8929 Other chronic pain: Secondary | ICD-10-CM

## 2021-05-25 MED ORDER — DICLOFENAC SODIUM 1 % EX GEL: 2.0000 g | Freq: Three times a day (TID) | CUTANEOUS | 1 refills | Status: DC

## 2021-05-25 MED ORDER — NAPROXEN 500 MG PO TABS: 500.0000 mg | ORAL_TABLET | Freq: Two times a day (BID) | ORAL | 0 refills | Status: DC

## 2021-05-25 NOTE — Progress Notes (Signed)
Subjective:  Amy Casey is a 61 y.o. female who presents for Chief Complaint  Patient presents with   Leg Swelling    Leg swelling all the way down to foot. Having to stay out of work sometimes due to pain and hard to walk     Here for ongoing concerns of leg pain and swelling.  She has been here a couple other times for concerns of swelling in her legs particularly on the left.  She was referred to vascular surgery and saw them earlier this month for consult.  She says they did not do anything to help her.  They advised compression hose which she has not started using.  They advised leg elevation and exercise and have plan to do a venous reflux test on her in the near future  She said she hurts all in her left leg from her hip down to her lower leg.  No recent injury or trauma.  No bruising.  No fever.  No shortness of breath.  No history of blood clot.  No numbness or tingling.  Sometimes it feels like her leg can give out.  She does have knee pain and knee swelling.  She was seen in the emergency department back in December for knee pain and leg pain and swelling  She was advised Voltaren gel back in December but she lost that  No other aggravating or relieving factors.    No other c/o.  Past Medical History:  Diagnosis Date   Depression    History of echocardiogram 2011   [facility]   History of renal stone    Hyperlipidemia    Hypertension    Hypokalemia    Nephrolithiasis    Obesity    Schizophrenia (HCC)    Wears glasses    Current Outpatient Medications on File Prior to Visit  Medication Sig Dispense Refill   acetaminophen (TYLENOL) 500 MG tablet Take 1 tablet (500 mg total) by mouth every 6 (six) hours as needed. 30 tablet 0   amLODipine (NORVASC) 10 MG tablet TAKE 1 TABLET BY MOUTH ONCE DAILY. APPOINTMENT REQUIRED FOR FUTURE REFILLS. (Patient taking differently: Take 10 mg by mouth in the morning.) 90 tablet 3   diclofenac Sodium (VOLTAREN) 1 % GEL Apply 2 g topically 4  (four) times daily. 50 g 0   FLUoxetine (PROZAC) 20 MG capsule Take 1 capsule (20 mg total) by mouth daily. 30 capsule 0   OLANZapine (ZYPREXA) 10 MG tablet Take 10 mg by mouth at bedtime.     pregabalin (LYRICA) 75 MG capsule Take 1 capsule (75 mg total) by mouth 2 (two) times daily. 60 capsule 2   rosuvastatin (CRESTOR) 10 MG tablet Take 1 tablet (10 mg total) by mouth daily. 90 tablet 3   solifenacin (VESICARE) 10 MG tablet Take 1 tablet (10 mg total) by mouth daily. 30 tablet 2   traZODone (DESYREL) 50 MG tablet Take 50 mg by mouth at bedtime as needed.     triamterene-hydrochlorothiazide (DYAZIDE) 37.5-25 MG capsule Take 1 each (1 capsule total) by mouth daily. 30 capsule 2   No current facility-administered medications on file prior to visit.     The following portions of the patient's history were reviewed and updated as appropriate: allergies, current medications, past family history, past medical history, past social history, past surgical history and problem list.  ROS Otherwise as in subjective above  Objective: BP 122/70    Pulse 77    Temp 98.8 F (37.1 C)  Wt 220 lb 12.8 oz (100.2 kg)    BMI 31.23 kg/m   General appearance: alert, no distress, well developed, well nourished Heart: RRR, normal S1, S2, no murmurs Lungs: CTA bilaterally, no wheezes, rhonchi, or rales Pulses: 2+ radial pulses, 2+ pedal pulses, normal cap refill Left lower leg with 1+ pitting edema, asymmetrical compared to right leg but unchanged from November 2022 visit.  Negative Homans, She does have slight general swelling of the left knee joint.  Tender over the joint line general but not tender over the patella lateral or medial knee.  She seems to be tender with knee range of motion in general which is relatively full.  Seem to have worse pain with knee extension.  No obvious laxity. Right leg and right knee unremarkable.  Bilateral hip with seemingly normal range of motion without pain Normal  strength and sensation of legs Back nontender with normal range of motion   Left knee xray 03/15/21: IMPRESSION: 1. Small to moderate sized joint effusion. 2. No acute fracture or dislocation.    Venous ultrasound legs 02/26/21: IMPRESSION: No lower extremity DVT.    Assessment: Encounter Diagnoses  Name Primary?   Chronic pain of left knee Yes   Left leg pain    Venous insufficiency    Leg edema      Plan: Your leg pain and swelling seems to be similar as it was in recent months  Back in November you had basically the same exam findings of leg swelling worse in the left.  I reviewed the recent vascular surgery notes.  They feel like you have chronic venous insufficiency.  They have plans to do a blood flow study/leg study on you soon.  You may want to follow-up with them to confirm the appointment time for this blood flow study  In the meantime they recommend you do compression hose daily knee-high stockings.  I assume they gave you a prescription for this.  Please call their office if you are not sure  They recommended leg elevation when possible.  They have recommended prolonged sitting or standing.  They also recommended regular exercise such as water aerobics or walking as these can help swelling.  You also have knee pain and the swelling noted on recent x-ray in December 2022.  I am going to refer you to orthopedics for further evaluation of your knee pain and swelling  In the meantime I prescribed 2 medications today, Naprosyn twice daily for the next 5 to 7 days for pain and swelling of the knee.  After 5 to 7 days you can just use the Naprosyn as needed  I also prescribe a topical anti-inflammatory.  When the pain is not so bad just use the topical cream instead of the oral Naprosyn  I recommend getting the compression hose soon  Follow-up with the vascular surgery office as planned    Amy Casey was seen today for leg swelling.  Diagnoses and all orders  for this visit:  Chronic pain of left knee  Left leg pain  Venous insufficiency  Leg edema   Follow up: with vascular surgery, ortho

## 2021-05-25 NOTE — Patient Instructions (Signed)
Your leg pain and swelling seems to be similar as it was in recent months  Back in November you had basically the same exam findings of leg swelling worse in the left.  I reviewed the recent vascular surgery notes.  They feel like you have chronic venous insufficiency.  They have plans to do a blood flow study/leg study on you soon.  You may want to follow-up with them to confirm the appointment time for this blood flow study  In the meantime they recommend you do compression hose daily knee-high stockings.  I assume they gave you a prescription for this.  Please call their office if you are not sure  They recommended leg elevation when possible.  They have recommended prolonged sitting or standing.  They also recommended regular exercise such as water aerobics or walking as these can help swelling.  You also have knee pain and the swelling noted on recent x-ray in December 2022.  I am going to refer you to orthopedics for further evaluation of your knee pain and swelling  In the meantime I prescribed 2 medications today, Naprosyn twice daily for the next 5 to 7 days for pain and swelling of the knee.  After 5 to 7 days you can just use the Naprosyn as needed  I also prescribe a topical anti-inflammatory.  When the pain is not so bad just use the topical cream instead of the oral Naprosyn  I recommend getting the compression hose soon  Follow-up with the vascular surgery office as planned

## 2021-06-08 DIAGNOSIS — Z0279 Encounter for issue of other medical certificate: Secondary | ICD-10-CM

## 2021-06-14 ENCOUNTER — Other Ambulatory Visit: Payer: Self-pay

## 2021-06-14 ENCOUNTER — Ambulatory Visit (INDEPENDENT_AMBULATORY_CARE_PROVIDER_SITE_OTHER): Payer: No Typology Code available for payment source | Admitting: Orthopedic Surgery

## 2021-06-14 ENCOUNTER — Ambulatory Visit (INDEPENDENT_AMBULATORY_CARE_PROVIDER_SITE_OTHER): Payer: No Typology Code available for payment source

## 2021-06-14 DIAGNOSIS — M25562 Pain in left knee: Secondary | ICD-10-CM | POA: Diagnosis not present

## 2021-06-14 DIAGNOSIS — G8929 Other chronic pain: Secondary | ICD-10-CM

## 2021-06-14 NOTE — Progress Notes (Signed)
? ?Office Visit Note ?  ?Patient: Amy Casey           ?Date of Birth: [birth year]           ?MRN: [id number] ?Visit Date: 06/14/2021 ?             ?Requested by: Carlena Hurl, PA-C ?[address] ?[place],   [address] ?PCP: Carlena Hurl, PA-C ? ?Chief Complaint  ?Patient presents with  ? Left Knee - Pain  ? ? ? ? ?HPI: ?Patient is a 61 year old woman who presents with 86-month history of left knee pain she complains of pain with ambulation decreased range of motion and global pain around the knee. ? ?Patient's last uric acid was 4.3 no evidence of gout. ? ?Assessment & Plan: ?Visit Diagnoses:  ?1. Chronic pain of left knee   ? ? ?Plan: Patient's knee was injected will reevaluate at follow-up. ? ?Follow-Up Instructions: No follow-ups on file.  ? ?Ortho Exam ? ?Patient is alert, oriented, no adenopathy, well-dressed, normal affect, normal respiratory effort. ?Examination there is mild crepitation with range of motion of the left knee in the patellofemoral joint.  The medial lateral joint lines are nontender to palpation collaterals and cruciates are stable.  There is no effusion. ? ?Imaging: ?No results found. ?No images are attached to the encounter. ? ?Labs: ?Lab Results  ?Component Value Date  ? HGBA1C 6.1 (H) 02/23/2021  ? HGBA1C 5.8 (H) 04/20/2019  ? HGBA1C 5.7 (H) 06/16/2015  ? LABURIC 4.3 02/23/2021  ? LABURIC 5.7 05/07/2012  ? REPTSTATUS 01/05/2021 FINAL 01/04/2021  ? GRAMSTAIN  02/19/2014  ?  CYTOSPIN SLIDE WBC PRESENT, PREDOMINANTLY MONONUCLEAR ?NO ORGANISMS SEEN ?Performed at [facility] ?Performed at [facility] ?  ? GRAMSTAIN  02/19/2014  ?  CYTOSPIN ?WBC PRESENT, PREDOMINANTLY MONONUCLEAR ?NO ORGANISMS SEEN ?  ? CULT MULTIPLE SPECIES PRESENT, SUGGEST RECOLLECTION (A) 01/04/2021  ? LABORGA PSEUDOMONAS AERUGINOSA (A) 07/16/2017  ? ? ? ?Lab Results  ?Component Value Date  ? ALBUMIN 3.7 03/15/2021  ? ALBUMIN 4.3 02/23/2021  ? ALBUMIN 4.5 01/04/2021  ? ? ?Lab Results   ?Component Value Date  ? MG 2.2 04/30/2019  ? MG 2.2 04/29/2019  ? MG 2.1 04/28/2019  ? ?No results found for: VD25OH ? ?No results found for: PREALBUMIN ?CBC EXTENDED Latest Ref Rng & Units 03/15/2021 02/23/2021 01/06/2021  ?WBC 4.0 - 10.5 K/uL 6.4 6.6 7.5  ?RBC 3.87 - 5.11 MIL/uL 3.92 3.96 4.75  ?HGB 12.0 - 15.0 g/dL 11.4(L) 11.4 13.9  ?HCT 36.0 - 46.0 % 36.9 35.2 42.5  ?PLT 150 - 400 K/uL 362 382 321  ?NEUTROABS 1.7 - 7.7 K/uL 2.9 - 5.4  ?LYMPHSABS 0.7 - 4.0 K/uL 2.2 - 1.5  ? ? ? ?There is no height or weight on file to calculate BMI. ? ?Orders:  ?Orders Placed This Encounter  ?Procedures  ? XR Knee 1-2 Views Left  ? ?No orders of the defined types were placed in this encounter. ? ? ? Procedures: ?Large Joint Inj: L knee on 06/15/2021 10:39 AM ?Indications: pain and diagnostic evaluation ?Details: 22 G 1.5 in needle, anteromedial approach ? ?Arthrogram: No ? ?Medications: 5 mL lidocaine (PF) 1 %; 40 mg methylPREDNISolone acetate 40 MG/ML ?Outcome: tolerated well, no immediate complications ?Procedure, treatment alternatives, risks and benefits explained, specific risks discussed. Consent was given by the patient. Immediately prior to procedure a time out was called to verify the correct patient, procedure, equipment, support staff and site/side marked as required.  Patient was prepped and draped in the usual sterile fashion.  ? ? ? ?Clinical Data: ?No additional findings. ? ?ROS: ? ?All other systems negative, except as noted in the HPI. ?Review of Systems ? ?Objective: ?Vital Signs: There were no vitals taken for this visit. ? ?Specialty Comments:  ?No specialty comments available. ? ?PMFS History: ?Patient Active Problem List  ? Diagnosis Date Noted  ? Need for influenza vaccination 02/23/2021  ? Screening for diabetes mellitus 02/23/2021  ? Edema 02/23/2021  ? Weight gain 02/23/2021  ? Elevated uric acid in blood 02/23/2021  ? Need for pneumococcal vaccination 02/23/2021  ? Insomnia 02/23/2021  ? Screen for  colon cancer 02/23/2021  ? Paresthesia 02/23/2021  ? History of recent hospitalization 01/26/2021  ? Cerebrovascular disease 12/31/2020  ? Vaccine refused by patient 12/31/2020  ? Hyperlipidemia 12/31/2020  ? Abnormal EKG 12/31/2020  ? Overactive bladder 07/22/2019  ? Vaccine counseling 07/22/2019  ? H/O positive serological reaction for syphilis 05/24/2019  ? Essential hypertension 11/06/2018  ? Encounter for health maintenance examination in adult 08/21/2018  ? 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 High risk medication use 07/25/2016  ? Involuntary commitment   ? Paranoid schizophrenia ([place]) 06/16/2015  ? Noncompliance 06/16/2015  ? ?Past Medical History:  ?Diagnosis Date  ? Depression   ? History of echocardiogram 2011  ? [facility]  ? History of renal stone   ? Hyperlipidemia   ? Hypertension   ? Hypokalemia   ? Nephrolithiasis   ? Obesity   ? Schizophrenia ([place])   ? Wears glasses   ?  ?Family History  ?Problem Relation Age of Onset  ? Diabetes Mother   ? Heart disease Mother   ? Chronic Renal Failure Mother   ? Cancer Maternal Uncle   ?     lung  ? Kidney disease Mother   ?     dialysis  ? Stroke Neg Hx   ? Hypertension Neg Hx   ? Hyperlipidemia Neg Hx   ?  ?Past Surgical History:  ?Procedure Laterality Date  ? LUMBAR PUNCTURE  2015  ? WISDOM TOOTH EXTRACTION    ? ?Social History  ? ?Occupational History  ? Not on file  ?Tobacco Use  ? Smoking status: Never  ? Smokeless tobacco: Never  ?Vaping Use  ? Vaping Use: Never used  ?Substance and Sexual Activity  ? Alcohol use: No  ? Drug use: No  ? Sexual activity: Not Currently  ? ? ? ? ? ?

## 2021-06-15 ENCOUNTER — Encounter: Payer: Self-pay | Admitting: Orthopedic Surgery

## 2021-06-15 DIAGNOSIS — M25562 Pain in left knee: Secondary | ICD-10-CM

## 2021-06-15 DIAGNOSIS — G8929 Other chronic pain: Secondary | ICD-10-CM

## 2021-06-15 MED ORDER — METHYLPREDNISOLONE ACETATE 40 MG/ML IJ SUSP
40.0000 mg | INTRAMUSCULAR | Status: AC | PRN
  Administered 2021-06-15: 40 mg via INTRA_ARTICULAR

## 2021-06-15 MED ORDER — LIDOCAINE HCL (PF) 1 % IJ SOLN
5.0000 mL | INTRAMUSCULAR | Status: AC | PRN
  Administered 2021-06-15: 5 mL

## 2021-07-12 ENCOUNTER — Telehealth: Payer: Self-pay

## 2021-07-12 ENCOUNTER — Ambulatory Visit (INDEPENDENT_AMBULATORY_CARE_PROVIDER_SITE_OTHER): Payer: No Typology Code available for payment source | Admitting: Orthopedic Surgery

## 2021-07-12 DIAGNOSIS — M25562 Pain in left knee: Secondary | ICD-10-CM

## 2021-07-12 DIAGNOSIS — G8929 Other chronic pain: Secondary | ICD-10-CM

## 2021-07-12 NOTE — Telephone Encounter (Signed)
Pt would like gel injection left knee OA ?

## 2021-07-13 NOTE — Telephone Encounter (Signed)
Noted  

## 2021-07-23 ENCOUNTER — Encounter: Payer: Self-pay | Admitting: Medical

## 2021-07-25 ENCOUNTER — Encounter: Payer: Self-pay | Admitting: Orthopedic Surgery

## 2021-07-25 NOTE — Progress Notes (Signed)
? ?Office Visit Note ?  ?Patient: Amy Casey           ?Date of Birth: 02/08/1961           ?MRN: [id number] ?Visit Date: 07/12/2021 ?             ?Requested by: Jac Canavan, PA-C ?[address] ?[place],  [place] [address] ?PCP: Jac Canavan, PA-C ? ?Chief Complaint  ?Patient presents with  ? Left Knee - Pain  ? ? ? ? ?HPI: ?Patient is a 61 year old woman who presents in follow-up for chronic left knee pain.  She is status post a left knee injection 4 weeks ago.  She had an x-ray 3 weeks ago as well.  She states she is limping and has difficulty getting from a sitting to a standing position.  She states the knee is not improving. ? ?Assessment & Plan: ?Visit Diagnoses:  ?1. Chronic pain of left knee   ? ? ?Plan: We will request authorizations for hyaluronic acid injection.  In the meantime she will use Voltaren gel 3 times a day. ? ?Follow-Up Instructions: Return if symptoms worsen or fail to improve.  ? ?Ortho Exam ? ?Patient is alert, oriented, no adenopathy, well-dressed, normal affect, normal respiratory effort. ?Examination patient has global pain to palpation around the left knee.  There is a mild effusion radiograph shows calcified meniscus.  She did have temporary relief with the steroid injection. ? ?Imaging: ?No results found. ?No images are attached to the encounter. ? ?Labs: ?Lab Results  ?Component Value Date  ? HGBA1C 6.1 (H) 02/23/2021  ? HGBA1C 5.8 (H) 04/20/2019  ? HGBA1C 5.7 (H) 06/16/2015  ? LABURIC 4.3 02/23/2021  ? LABURIC 5.7 05/07/2012  ? REPTSTATUS 01/05/2021 FINAL 01/04/2021  ? GRAMSTAIN  02/19/2014  ?  CYTOSPIN SLIDE WBC PRESENT, PREDOMINANTLY MONONUCLEAR ?NO ORGANISMS SEEN ?Performed at [facility] ?Performed at [facility] ?  ? GRAMSTAIN  02/19/2014  ?  CYTOSPIN ?WBC PRESENT, PREDOMINANTLY MONONUCLEAR ?NO ORGANISMS SEEN ?  ? CULT MULTIPLE SPECIES PRESENT, SUGGEST RECOLLECTION (A) 01/04/2021  ? LABORGA PSEUDOMONAS AERUGINOSA (A) 07/16/2017  ? ? ? ?Lab  Results  ?Component Value Date  ? ALBUMIN 3.7 03/15/2021  ? ALBUMIN 4.3 02/23/2021  ? ALBUMIN 4.5 01/04/2021  ? ? ?Lab Results  ?Component Value Date  ? MG 2.2 04/30/2019  ? MG 2.2 04/29/2019  ? MG 2.1 04/28/2019  ? ?No results found for: VD25OH ? ?No results found for: PREALBUMIN ? ?  Latest Ref Rng & Units 03/15/2021  ? 10:51 PM 02/23/2021  ?  2:16 PM 01/06/2021  ?  3:31 PM  ?CBC EXTENDED  ?WBC 4.0 - 10.5 K/uL 6.4   6.6   7.5    ?RBC 3.87 - 5.11 MIL/uL 3.92   3.96   4.75    ?Hemoglobin 12.0 - 15.0 g/dL 35.5   73.2   20.2    ?HCT 36.0 - 46.0 % 36.9   35.2   42.5    ?Platelets 150 - 400 K/uL 362   382   321    ?NEUT# 1.7 - 7.7 K/uL 2.9    5.4    ?Lymph# 0.7 - 4.0 K/uL 2.2    1.5    ? ? ? ?There is no height or weight on file to calculate BMI. ? ?Orders:  ?No orders of the defined types were placed in this encounter. ? ?No orders of the defined types were placed in this encounter. ? ? ? Procedures: ?  No procedures performed ? ?Clinical Data: ?No additional findings. ? ?ROS: ? ?All other systems negative, except as noted in the HPI. ?Review of Systems ? ?Objective: ?Vital Signs: There were no vitals taken for this visit. ? ?Specialty Comments:  ?No specialty comments available. ? ?PMFS History: ?Patient Active Problem List  ? Diagnosis Date Noted  ? Need for influenza vaccination 02/23/2021  ? Screening for diabetes mellitus 02/23/2021  ? Edema 02/23/2021  ? Weight gain 02/23/2021  ? Elevated uric acid in blood 02/23/2021  ? Need for pneumococcal vaccination 02/23/2021  ? Insomnia 02/23/2021  ? Screen for colon cancer 02/23/2021  ? Paresthesia 02/23/2021  ? History of recent hospitalization 01/26/2021  ? Cerebrovascular disease 12/31/2020  ? Vaccine refused by patient 12/31/2020  ? Hyperlipidemia 12/31/2020  ? Abnormal EKG 12/31/2020  ? Overactive bladder 07/22/2019  ? Vaccine counseling 07/22/2019  ? H/O positive serological reaction for syphilis 05/24/2019  ? Essential hypertension 11/06/2018  ? Encounter for health  maintenance examination in adult 08/21/2018  ? 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 High risk medication use 07/25/2016  ? Involuntary commitment   ? Paranoid schizophrenia ([facility]) 06/16/2015  ? Noncompliance 06/16/2015  ? ?Past Medical History:  ?Diagnosis Date  ? Depression   ? History of echocardiogram 2011  ? [facility]  ? History of renal stone   ? Hyperlipidemia   ? Hypertension   ? Hypokalemia   ? Nephrolithiasis   ? Obesity   ? Schizophrenia (HCC)   ? Wears glasses   ?  ?Family History  ?Problem Relation Age of Onset  ? Diabetes Mother   ? Heart disease Mother   ? Chronic Renal Failure Mother   ? Cancer Maternal Uncle   ?     lung  ? Kidney disease Mother   ?     dialysis  ? Stroke Neg Hx   ? Hypertension Neg Hx   ? Hyperlipidemia Neg Hx   ?  ?Past Surgical History:  ?Procedure Laterality Date  ? LUMBAR PUNCTURE  2015  ? WISDOM TOOTH EXTRACTION    ? ?Social History  ? ?Occupational History  ? Not on file  ?Tobacco Use  ? Smoking status: Never  ? Smokeless tobacco: Never  ?Vaping Use  ? Vaping Use: Never used  ?Substance and Sexual Activity  ? Alcohol use: No  ? Drug use: No  ? Sexual activity: Not Currently  ? ? ? ? ? ?

## 2021-08-03 ENCOUNTER — Encounter: Payer: No Typology Code available for payment source | Admitting: Medical

## 2021-08-05 ENCOUNTER — Encounter: Payer: Self-pay | Admitting: Medical

## 2021-08-05 ENCOUNTER — Ambulatory Visit: Payer: Self-pay | Admitting: Vascular Surgery

## 2021-08-05 ENCOUNTER — Ambulatory Visit (HOSPITAL_COMMUNITY): Payer: No Typology Code available for payment source | Attending: Vascular Surgery

## 2021-08-25 ENCOUNTER — Telehealth: Payer: Self-pay | Admitting: Orthopedic Surgery

## 2021-08-25 NOTE — Telephone Encounter (Signed)
Pt called for the 2nd time about if she was approved for gel injection. Please call pt at [phone]

## 2021-08-25 NOTE — Telephone Encounter (Signed)
Pt called requesting a call back concerning update on approval for gel injection. Please call pt at [phone].

## 2021-08-26 NOTE — Telephone Encounter (Signed)
Called and left a Vm for patient to CB to schedule for gel injection. 

## 2021-08-26 NOTE — Telephone Encounter (Signed)
See previous message in chart.  

## 2021-09-06 ENCOUNTER — Ambulatory Visit: Payer: No Typology Code available for payment source | Admitting: Medical

## 2021-09-07 ENCOUNTER — Ambulatory Visit: Payer: No Typology Code available for payment source | Admitting: Medical

## 2021-09-07 VITALS — BP 138/80 | HR 101 | Wt 225.8 lb

## 2021-09-07 DIAGNOSIS — R635 Abnormal weight gain: Secondary | ICD-10-CM

## 2021-09-07 DIAGNOSIS — I10 Essential (primary) hypertension: Secondary | ICD-10-CM | POA: Diagnosis not present

## 2021-09-07 DIAGNOSIS — E785 Hyperlipidemia, unspecified: Secondary | ICD-10-CM | POA: Diagnosis not present

## 2021-09-07 DIAGNOSIS — R7301 Impaired fasting glucose: Secondary | ICD-10-CM | POA: Diagnosis not present

## 2021-09-07 DIAGNOSIS — F200 Paranoid schizophrenia: Secondary | ICD-10-CM

## 2021-09-07 DIAGNOSIS — G4700 Insomnia, unspecified: Secondary | ICD-10-CM

## 2021-09-07 LAB — POCT GLYCOSYLATED HEMOGLOBIN (HGB A1C): Hemoglobin A1C: 6.3 % — AB (ref 4.0–5.6)

## 2021-09-07 MED ORDER — SOLIFENACIN SUCCINATE 10 MG PO TABS: 10.0000 mg | ORAL_TABLET | Freq: Every day | ORAL | 2 refills | Status: DC

## 2021-09-07 MED ORDER — DICLOFENAC SODIUM 1 % EX GEL: 2.0000 g | Freq: Three times a day (TID) | CUTANEOUS | 3 refills | Status: DC

## 2021-09-07 MED ORDER — PREGABALIN 75 MG PO CAPS: 75.0000 mg | ORAL_CAPSULE | Freq: Two times a day (BID) | ORAL | 1 refills | Status: DC

## 2021-09-07 MED ORDER — TRIAMTERENE-HCTZ 37.5-25 MG PO CAPS: 1.0000 | ORAL_CAPSULE | Freq: Every day | ORAL | 3 refills | Status: DC

## 2021-09-07 MED ORDER — OLANZAPINE 10 MG PO TABS: 10.0000 mg | ORAL_TABLET | Freq: Every day | ORAL | 0 refills | Status: DC

## 2021-09-07 MED ORDER — TRAZODONE HCL 50 MG PO TABS: 50.0000 mg | ORAL_TABLET | Freq: Every evening | ORAL | 0 refills | Status: DC | PRN

## 2021-09-07 MED ORDER — WEGOVY 0.25 MG/0.5ML ~~LOC~~ SOAJ: 0.2500 mg | SUBCUTANEOUS | 0 refills | Status: DC

## 2021-09-07 MED ORDER — FLUOXETINE HCL 20 MG PO CAPS: 20.0000 mg | ORAL_CAPSULE | Freq: Every day | ORAL | 0 refills | Status: DC

## 2021-09-07 NOTE — Patient Instructions (Addendum)
I refilled several of your medicines today   History of schizophrenia For now continue your current medications fluoxetine Prozac and Zyprexa We will work on a referral to a different in person psychiatrist Let me know if you have not gotten a phone call back about this appointment within the next week Always take your medicines regularly without skipping doses   Insomnia I refilled the trazodone medication   Knee pain, joint pain Follow-up with the specialist as planned I refilled the Lyrica which is for pain as well as the Voltaren topical gel   High blood pressure Continue amlodipine 10 mg daily, continue Dyazide/triamterene-hydrochlorothiazide daily Monitor your blood pressures at home.  Goal is less than 130/80 Limit salt Work on losing weight   High cholesterol Continue rosuvastatin Crestor 10 mg daily   Impaired glucose/prediabetes Be careful with your diet, limit sweets, sweet tea, soda, high sugar foods, potatoes and other high carbs Work on losing [employer] pen weekly Lets recheck in 1 month on this

## 2021-09-07 NOTE — Progress Notes (Signed)
Subjective:  Amy Casey is a 61 y.o. female who presents for Chief Complaint  Patient presents with   Follow-up    Wants to find a new psychiatrist. No other concerns      Here for concern for psychiatry.  She notes compliance with medications for mental health.  Last appt was months ago with psychiatry.  Was having to do virtual, half the time couldn't get the video to work , and she much prefers in person visits, not virtual.  Wants referral to in person local psychiatrist.     Overall doing well expect for her knees.    Has been seeing ortho.  Is awaiting a knee injection  Has gained weight in recent months, possibly related to less mobility with the knee and medication side effects.   Impaired glucose - no recent concerns.   Ran out of some of her medications  No other aggravating or relieving factors.    No other c/o.  Past Medical History:  Diagnosis Date   Depression    History of echocardiogram 2011   [facility]   History of renal stone    Hyperlipidemia    Hypertension    Hypokalemia    Nephrolithiasis    Obesity    Schizophrenia (HCC)    Wears glasses    Current Outpatient Medications on File Prior to Visit  Medication Sig Dispense Refill   acetaminophen (TYLENOL) 500 MG tablet Take 1 tablet (500 mg total) by mouth every 6 (six) hours as needed. 30 tablet 0   amLODipine (NORVASC) 10 MG tablet TAKE 1 TABLET BY MOUTH ONCE DAILY. APPOINTMENT REQUIRED FOR FUTURE REFILLS. (Patient taking differently: Take 10 mg by mouth in the morning.) 90 tablet 3   rosuvastatin (CRESTOR) 10 MG tablet Take 1 tablet (10 mg total) by mouth daily. 90 tablet 3   No current facility-administered medications on file prior to visit.     The following portions of the patient's history were reviewed and updated as appropriate: allergies, current medications, past family history, past medical history, past social history, past surgical history and problem list.  ROS Otherwise as in  subjective above  Objective: BP 138/80   Pulse (!) 101   Wt 225 lb 12.8 oz (102.4 kg)   SpO2 98%   BMI 31.94 kg/m   Wt Readings from Last 3 Encounters:  09/07/21 225 lb 12.8 oz (102.4 kg)  05/25/21 220 lb 12.8 oz (100.2 kg)  04/29/21 209 lb (94.8 kg)   General appearance: alert, no distress, well developed, well nourished Psych: pleasant, good eye contact, answers questions appropriately    Assessment: Encounter Diagnoses  Name Primary?   Paranoid schizophrenia (HCC) Yes   Weight gain    Essential hypertension    Hyperlipidemia, unspecified hyperlipidemia type    Insomnia, unspecified type    Impaired fasting blood sugar      Plan: I refilled several of your medicines today   History of schizophrenia For now continue your current medications fluoxetine Prozac and Zyprexa We will work on a referral to a different in person psychiatrist Let me know if you have not gotten a phone call back about this appointment within the next week Always take your medicines regularly without skipping doses   Insomnia I refilled the trazodone medication   Knee pain, joint pain Follow-up with the specialist as planned I refilled the Lyrica which is for pain as well as the Voltaren topical gel   High blood pressure Continue amlodipine 10 mg daily,  continue Dyazide/triamterene-hydrochlorothiazide daily Monitor your blood pressures at home.  Goal is less than 130/80 Limit salt Work on losing weight   High cholesterol Continue rosuvastatin Crestor 10 mg daily   Impaired glucose/prediabetes Be careful with your diet, limit sweets, sweet tea, soda, high sugar foods, potatoes and other high carbs Work on losing [employer] pen weekly Lets recheck in 1 month on this    [name] was seen today for follow-up.  Diagnoses and all orders for this visit:  Paranoid schizophrenia (HCC) -     Ambulatory referral to Psychiatry  Weight gain  Essential  hypertension  Hyperlipidemia, unspecified hyperlipidemia type  Insomnia, unspecified type -     Ambulatory referral to Psychiatry  Impaired fasting blood sugar -     HgB A1c  Other orders -     diclofenac Sodium (VOLTAREN) 1 % GEL; Apply 2 g topically in the morning, at noon, and at bedtime. -     FLUoxetine (PROZAC) 20 MG capsule; Take 1 capsule (20 mg total) by mouth daily. -     OLANZapine (ZYPREXA) 10 MG tablet; Take 1 tablet (10 mg total) by mouth at bedtime. -     pregabalin (LYRICA) 75 MG capsule; Take 1 capsule (75 mg total) by mouth 2 (two) times daily. -     solifenacin (VESICARE) 10 MG tablet; Take 1 tablet (10 mg total) by mouth daily. -     triamterene-hydrochlorothiazide (DYAZIDE) 37.5-25 MG capsule; Take 1 each (1 capsule total) by mouth daily. -     traZODone (DESYREL) 50 MG tablet; Take 1 tablet (50 mg total) by mouth at bedtime as needed. -     Semaglutide-Weight Management (WEGOVY) 0.25 MG/0.5ML SOAJ; Inject 0.25 mg into the skin once a week.    Follow up: pending referral

## 2021-09-23 ENCOUNTER — Telehealth: Payer: Self-pay | Admitting: Medical

## 2021-09-23 NOTE — Telephone Encounter (Signed)
Pt called and states you where suppose to give her a referral for a psychiatrist She would like to know if you found her one

## 2021-09-24 ENCOUNTER — Telehealth: Payer: Self-pay | Admitting: Medical

## 2021-09-24 NOTE — Telephone Encounter (Signed)
Tried to call pt back again .and left a detailed message with phone number to call and schedule appointnment with  [facility] number is wanting is [phone]

## 2021-09-24 NOTE — Telephone Encounter (Signed)
Pt called you back, I advised you would give her a call when available. Pt didn't want to be sent to your vm.

## 2021-09-24 NOTE — Telephone Encounter (Signed)
Left message for pt to contact them at [phone]

## 2021-09-27 ENCOUNTER — Telehealth: Payer: Self-pay | Admitting: Medical

## 2021-09-27 NOTE — Telephone Encounter (Signed)
[facility] medical records request receive and forwarded to HIM on 09/15/2021.

## 2022-03-02 ENCOUNTER — Emergency Department (HOSPITAL_COMMUNITY)
Admission: EM | Admit: 2022-03-02 | Discharge: 2022-03-02 | Disposition: A | Discharge status: Home / Self Care | Payer: No Typology Code available for payment source | Attending: Emergency Medicine | Admitting: Emergency Medicine

## 2022-03-02 ENCOUNTER — Other Ambulatory Visit: Payer: Self-pay

## 2022-03-02 ENCOUNTER — Encounter (HOSPITAL_COMMUNITY): Payer: Self-pay

## 2022-03-02 DIAGNOSIS — M25562 Pain in left knee: Secondary | ICD-10-CM | POA: Diagnosis not present

## 2022-03-02 DIAGNOSIS — Z8616 Personal history of COVID-19: Secondary | ICD-10-CM | POA: Diagnosis not present

## 2022-03-02 DIAGNOSIS — M25561 Pain in right knee: Secondary | ICD-10-CM | POA: Insufficient documentation

## 2022-03-02 LAB — CBC
HCT: 42.4 % (ref 36.0–46.0)
Hemoglobin: 13.9 g/dL (ref 12.0–15.0)
MCH: 29 pg (ref 26.0–34.0)
MCHC: 32.8 g/dL (ref 30.0–36.0)
MCV: 88.3 fL (ref 80.0–100.0)
Platelets: 438 10*3/uL — ABNORMAL HIGH (ref 150–400)
RBC: 4.8 MIL/uL (ref 3.87–5.11)
RDW: 13.7 % (ref 11.5–15.5)
WBC: 8.2 10*3/uL (ref 4.0–10.5)
nRBC: 0 % (ref 0.0–0.2)

## 2022-03-02 LAB — URINALYSIS, ROUTINE W REFLEX MICROSCOPIC
Bilirubin Urine: NEGATIVE
Glucose, UA: NEGATIVE mg/dL
Hgb urine dipstick: NEGATIVE
Ketones, ur: 5 mg/dL — AB
Leukocytes,Ua: NEGATIVE
Nitrite: NEGATIVE
Protein, ur: NEGATIVE mg/dL
Specific Gravity, Urine: 1.016 (ref 1.005–1.030)
pH: 5 (ref 5.0–8.0)

## 2022-03-02 LAB — BASIC METABOLIC PANEL
Anion gap: 11 (ref 5–15)
BUN: 66 mg/dL — ABNORMAL HIGH (ref 8–23)
CO2: 25 mmol/L (ref 22–32)
Calcium: 9.9 mg/dL (ref 8.9–10.3)
Chloride: 106 mmol/L (ref 98–111)
Creatinine, Ser: 1.67 mg/dL — ABNORMAL HIGH (ref 0.44–1.00)
GFR, Estimated: 34 mL/min — ABNORMAL LOW (ref >60)
Glucose, Bld: 119 mg/dL — ABNORMAL HIGH (ref 70–99)
Potassium: 3.7 mmol/L (ref 3.5–5.1)
Sodium: 142 mmol/L (ref 135–145)

## 2022-03-02 NOTE — ED Triage Notes (Signed)
Pt arrived POV from home stating she wanted her potassium checked and to be checked for yeast. Pt states she has no symptoms but she thinks her potassium is off because she has been off balance x1 week.

## 2022-03-02 NOTE — ED Provider Notes (Signed)
[facility] EMERGENCY DEPARTMENT Provider Note   CSN: [id number] Arrival date & time: 03/02/22  1128     History  Chief Complaint  Patient presents with   Gait Problem    Amy Casey is a 61 y.o. female.  Patient is brought here to the emergency department by a family member who is concerned that patient's potassium could be low.  Patient complains of pain in both of her knees.  Patient reports that her knees hurt when she walks.  Patient reports that she has swelling in both of her knees.  Family member reports patient had COVID a month ago.  [facility].  Patient denies any shortness of breath she reports she is not having any difficulty breathing she has not had a fever or chills.  Family member is concerned that patient could have infection.  The history is provided by the patient. No language interpreter was used.       Home Medications Prior to Admission medications   Medication Sig Start Date End Date Taking? Authorizing Provider  acetaminophen (TYLENOL) 500 MG tablet Take 1 tablet (500 mg total) by mouth every 6 (six) hours as needed. 03/16/21   Blue, Soijett A, PA-C  amLODipine (NORVASC) 10 MG tablet TAKE 1 TABLET BY MOUTH ONCE DAILY. APPOINTMENT REQUIRED FOR FUTURE REFILLS. Patient taking differently: Take 10 mg by mouth in the morning. 01/01/21   Tysinger, Camelia Eng, PA-C  diclofenac Sodium (VOLTAREN) 1 % GEL Apply 2 g topically in the morning, at noon, and at bedtime. 09/07/21   Tysinger, Camelia Eng, PA-C  FLUoxetine (PROZAC) 20 MG capsule Take 1 capsule (20 mg total) by mouth daily. 09/07/21 12/06/21  Tysinger, Camelia Eng, PA-C  OLANZapine (ZYPREXA) 10 MG tablet Take 1 tablet (10 mg total) by mouth at bedtime. 09/07/21   Tysinger, Camelia Eng, PA-C  pregabalin (LYRICA) 75 MG capsule Take 1 capsule (75 mg total) by mouth 2 (two) times daily. 09/07/21   Tysinger, Camelia Eng, PA-C  rosuvastatin (CRESTOR) 10 MG tablet Take 1 tablet  (10 mg total) by mouth daily. 01/01/21 01/01/22  Tysinger, Camelia Eng, PA-C  Semaglutide-Weight Management (WEGOVY) 0.25 MG/0.5ML SOAJ Inject 0.25 mg into the skin once a week. 09/07/21   Tysinger, Camelia Eng, PA-C  solifenacin (VESICARE) 10 MG tablet Take 1 tablet (10 mg total) by mouth daily. 09/07/21   Tysinger, Camelia Eng, PA-C  traZODone (DESYREL) 50 MG tablet Take 1 tablet (50 mg total) by mouth at bedtime as needed. 09/07/21   Tysinger, Camelia Eng, PA-C  triamterene-hydrochlorothiazide (DYAZIDE) 37.5-25 MG capsule Take 1 each (1 capsule total) by mouth daily. 09/07/21 09/07/22  Tysinger, Camelia Eng, PA-C      Allergies    Paliperidone, Lisinopril, and Sulfa antibiotics    Review of Systems   Review of Systems  Constitutional:  Negative for fever.  HENT:  Negative for mouth sores and sore throat.   Gastrointestinal:  Negative for diarrhea and nausea.  Genitourinary:  Negative for flank pain.  All other systems reviewed and are negative.   Physical Exam Updated Vital Signs BP (!) 125/99   Pulse (!) 109   Temp 98.2 F (36.8 C) (Oral)   Resp 18   SpO2 99%  Physical Exam Vitals and nursing note reviewed.  Constitutional:      Appearance: She is well-developed.  HENT:     Head: Normocephalic.     Mouth/Throat:     Mouth: Mucous membranes are  moist.  Cardiovascular:     Rate and Rhythm: Normal rate.  Pulmonary:     Effort: Pulmonary effort is normal.  Abdominal:     General: Abdomen is flat. There is no distension.  Musculoskeletal:        General: Swelling present. No tenderness or deformity. Normal range of motion.     Cervical back: Normal range of motion.  Skin:    General: Skin is warm.  Neurological:     General: No focal deficit present.     Mental Status: She is alert and oriented to person, place, and time.  Psychiatric:        Mood and Affect: Mood normal.     ED Results / Procedures / Treatments   Labs (all labs ordered are listed, but only abnormal results are  displayed) Labs Reviewed  BASIC METABOLIC PANEL - Abnormal; Notable for the following components:      Result Value   Glucose, Bld 119 (*)    BUN 66 (*)    Creatinine, Ser 1.67 (*)    GFR, Estimated 34 (*)    All other components within normal limits  CBC - Abnormal; Notable for the following components:   Platelets 438 (*)    All other components within normal limits  URINALYSIS, ROUTINE W REFLEX MICROSCOPIC - Abnormal; Notable for the following components:   APPearance HAZY (*)    Ketones, ur 5 (*)    All other components within normal limits  CBG MONITORING, ED    EKG None  Radiology No results found.  Procedures Procedures    Medications Ordered in ED Medications - No data to display  ED Course/ Medical Decision Making/ A&P                           Medical Decision Making Family is concerned that patient's potassium could be low.  Patient has a history of having low potassium in the past  Amount and/or Complexity of Data Reviewed Independent Historian: caregiver    Details: Member present with patient Labs: ordered. Decision-making details documented in ED Course.    Details: And has a BUN of 66 creatinine is 1.67.  Previous labs are reviewed and patient has a history of renal insufficiency  Risk Risk Details: Patient was able to ambulate with minimal difficulty patient's potassium is normal.  Family has concerns about the medication that patient is taking.  I have advised that they should schedule her to see her primary care physician for evaluation.           Final Clinical Impression(s) / ED Diagnoses Final diagnoses:  Acute pain of both knees    Rx / DC Orders ED Discharge Orders     None     An After Visit Summary was printed and given to the patient.     Elson Areas, PA-C 03/02/22 2027    Elayne Snare K, DO 03/02/22 2324

## 2022-03-02 NOTE — ED Provider Triage Note (Signed)
Emergency Medicine Provider Triage Evaluation Note  Amy Casey , a 61 y.o. female  was evaluated in triage.  Pt complains of weakness. Report feeling off balance for the past week and was worried that her potassium is low.  Report having some sickness 2 weeks ago.  No n/v/d, no pain.  Report wanting to check for yeast infection as well  Review of Systems  Positive: As above Negative: As above  Physical Exam  BP (!) 146/107   Pulse (!) 111   Temp 97.7 F (36.5 C) (Oral)   Resp 16   SpO2 100%  Gen:   Awake, no distress   Resp:  Normal effort  MSK:   Moves extremities without difficulty  Other:    Medical Decision Making  Medically screening exam initiated at 1:21 PM.  Appropriate orders placed.  Daryan Cagley was informed that the remainder of the evaluation will be completed by another provider, this initial triage assessment does not replace that evaluation, and the importance of remaining in the ED until their evaluation is complete.     Fayrene Helper, PA-C 03/02/22 1324

## 2022-03-02 NOTE — Discharge Instructions (Signed)
Take tylenol for knee discomfort.  Schedule to see your Provider for review of your medications.

## 2022-03-03 ENCOUNTER — Telehealth: Payer: Self-pay | Admitting: Medical

## 2022-03-03 ENCOUNTER — Telehealth: Payer: Self-pay

## 2022-03-03 NOTE — Telephone Encounter (Signed)
Transition Care Management Follow-up Telephone Call Date of discharge and from where: 03/02/2022 [facility] How have you been since you were released from the hospital? ok Any questions or concerns? No  Items Reviewed: Did the pt receive and understand the discharge instructions provided? Yes  Medications obtained and verified? Yes  Other? No  Any new allergies since your discharge? No  Dietary orders reviewed? Yes Do you have support at home? Yes   Home Care and Equipment/Supplies: Were home health services ordered? not applicable If so, what is the name of the agency? negative/a  Has the agency set up a time to come to the patient's home? not applicable Were any new equipment or medical supplies ordered?  No What is the name of the medical supply agency? N/a Were you able to get the supplies/equipment? not applicable Do you have any questions related to the use of the equipment or supplies? No  Functional Questionnaire: (I = Independent and D = Dependent) ADLs: I  Bathing/Dressing- I  Meal Prep- I  Eating- I  Maintaining continence- I  Transferring/Ambulation- I  Managing Meds- I  Follow up appointments reviewed:  PCP Hospital f/u appt confirmed? Yes  Scheduled to see Kristian Covey on 03/04/2022 @ 3:00p. Are transportation arrangements needed? No  If their condition worsens, is the pt aware to call PCP or go to the Emergency Dept.? Yes Was the patient provided with contact information for the PCP's office or ED? Yes Was to pt encouraged to call back with questions or concerns? Yes

## 2022-03-03 NOTE — Telephone Encounter (Signed)
Transition Care Management Unsuccessful Follow-up Telephone Call  Date of discharge and from where:  03/02/2022 [facility]  Attempts:  1st Attempt  Reason for unsuccessful TCM follow-up call:  Left voice message

## 2022-03-04 ENCOUNTER — Encounter: Payer: Self-pay | Admitting: Medical

## 2022-03-04 ENCOUNTER — Ambulatory Visit (INDEPENDENT_AMBULATORY_CARE_PROVIDER_SITE_OTHER): Payer: No Typology Code available for payment source | Admitting: Medical

## 2022-03-04 ENCOUNTER — Ambulatory Visit (HOSPITAL_COMMUNITY)
Admission: EM | Admit: 2022-03-04 | Discharge: 2022-03-04 | Disposition: A | Discharge status: Home / Self Care | Payer: No Typology Code available for payment source | Attending: Behavioral Health | Admitting: Behavioral Health

## 2022-03-04 VITALS — BP 122/70 | HR 112 | Wt 207.0 lb

## 2022-03-04 DIAGNOSIS — R7301 Impaired fasting glucose: Secondary | ICD-10-CM

## 2022-03-04 DIAGNOSIS — F200 Paranoid schizophrenia: Secondary | ICD-10-CM

## 2022-03-04 DIAGNOSIS — Z91148 Patient's other noncompliance with medication regimen for other reason: Secondary | ICD-10-CM

## 2022-03-04 DIAGNOSIS — E860 Dehydration: Secondary | ICD-10-CM | POA: Insufficient documentation

## 2022-03-04 DIAGNOSIS — Z91199 Patient's noncompliance with other medical treatment and regimen due to unspecified reason: Secondary | ICD-10-CM | POA: Diagnosis not present

## 2022-03-04 DIAGNOSIS — R7989 Other specified abnormal findings of blood chemistry: Secondary | ICD-10-CM | POA: Insufficient documentation

## 2022-03-04 DIAGNOSIS — Z79899 Other long term (current) drug therapy: Secondary | ICD-10-CM

## 2022-03-04 DIAGNOSIS — I10 Essential (primary) hypertension: Secondary | ICD-10-CM

## 2022-03-04 MED ORDER — OLANZAPINE 5 MG PO TABS
5.0000 mg | ORAL_TABLET | Freq: Once | ORAL | Status: DC
  Filled 2022-03-04: qty 1

## 2022-03-04 MED ORDER — OLANZAPINE 5 MG PO TBDP
5.0000 mg | ORAL_TABLET | Freq: Once | ORAL | Status: AC
  Administered 2022-03-04: 5 mg via ORAL

## 2022-03-04 MED ORDER — OLANZAPINE 5 MG PO TBDP
ORAL_TABLET | ORAL | Status: AC
  Filled 2022-03-04: qty 1

## 2022-03-04 NOTE — Discharge Instructions (Addendum)
Discharge recommendations:   Medications: Patient is to take medications as prescribed. The patient or patient's guardian is to contact a medical professional and/or outpatient provider to address any new side effects that develop. The patient or the patient's guardian should update outpatient providers of any new medications and/or medication changes.   Outpatient Follow up: Please review list of outpatient resources for psychiatry and counseling. Please follow up with your primary care provider for all medical related needs.   Therapy: We recommend that patient participate in individual therapy to address mental health concerns.  Atypical antipsychotics: If you are prescribed an atypical antipsychotic, it is recommended that your height, weight, BMI, blood pressure, fasting lipid panel, and fasting blood sugar be monitored by your outpatient providers.  Safety:   The following safety precautions should be taken:   No sharp objects. This includes scissors, razors, scrapers, and putty knives.   Chemicals should be removed and locked up.   Medications should be removed and locked up.   Weapons should be removed and locked up. This includes firearms, knives and instruments that can be used to cause injury.   The patient should abstain from use of illicit substances/drugs and abuse of any medications.  If symptoms worsen or do not continue to improve or if the patient becomes actively suicidal or homicidal then it is recommended that the patient return to the closest hospital emergency department, the [facility], or call 911 for further evaluation and treatment. National Suicide Prevention Lifeline 1-800-SUICIDE or 1-800-273-8255.  About 988 988 offers 24/7 access to trained crisis counselors who can help people experiencing mental health-related distress. People can call or text 988 or chat 988lifeline.org for themselves or if they are worried about a loved one  who may need crisis support.   Please contact one of the following facilities to start medication management and therapy services:   [facility] [place] [address]  [place], [place] [address] [phone]   [facility]  [address] [place], [place] [address] [phone]  [facility]  [address], [place], [place] [address] [phone]  [facility]  [address]  [place], [place] [address] [phone]  [facility]  [address] [place], [place] [address] [phone]  [facility]  [address],  [place],  [address] [phone]   
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
## 2022-03-04 NOTE — Patient Instructions (Addendum)
Please take her to the behavioral Health Center just up the street now   [facility] Crisis Line and Main phone number [phone]  Behavioral Health Urgent Care  [phone]  Behavioral Health Outpatient Clinic [phone]  Adult Crisis Center [phone]     Address: [address], [place], [place] [address] Hours:  Open 24 hours Phone: [phone]

## 2022-03-04 NOTE — Progress Notes (Signed)
Subjective:  Amy Casey is a 61 y.o. female who presents for Chief Complaint  Patient presents with   Follow-up     Her husband brings her in today.  She says she is not sure why she is here but she sometimes feels off balance, sleeping a lot lately.  Husband says that she is concerned about her mood and medication compliance.  Her Sister Amy Casey who came with her a year ago with her visit is also being concerned that she has not been taking her medication.  On triage today she specifically stated that she has not been taking her medications for the most part.  Has been think she is taking some of them but not all of them.  After further discussion she seems to voice that she has not been taking her medication for mood  She has her work shirt from [employer] on but she says she has not been at work all week.  She says that she is sleeping.  Of note she does doze off some during interview.  Husband is concerned about her not being on her medication like she is supposed to.  She has not seen the psychiatry office in months.  Her pill bottles today present are Prozac, Lyrica, trazodone, hydroxyzine, and an unlabeled bottle that we determined was amlodipine after reviewing pill pictures.  She does not have her olanzapine with her.  She also does not have Dyazide with her that stated in her chart record.  She does not have the Vesicare or Crestor bottles with her  Hydroxyzine was not listed as an updated medication but she is apparently taking this some.  No other aggravating or relieving factors.    No other c/o.  Past Medical History:  Diagnosis Date   Depression    History of echocardiogram 2011   [facility]   History of renal stone    Hyperlipidemia    Hypertension    Hypokalemia    Nephrolithiasis    Obesity    Schizophrenia (HCC)    Wears glasses    Current Outpatient Medications on File Prior to Visit  Medication Sig Dispense Refill   amLODipine (NORVASC) 10 MG tablet TAKE 1  TABLET BY MOUTH ONCE DAILY. APPOINTMENT REQUIRED FOR FUTURE REFILLS. (Patient taking differently: Take 10 mg by mouth in the morning.) 90 tablet 3   pregabalin (LYRICA) 75 MG capsule Take 1 capsule (75 mg total) by mouth 2 (two) times daily. 60 capsule 1   traZODone (DESYREL) 50 MG tablet Take 1 tablet (50 mg total) by mouth at bedtime as needed. 90 tablet 0   FLUoxetine (PROZAC) 20 MG capsule Take 1 capsule (20 mg total) by mouth daily. 90 capsule 0   OLANZapine (ZYPREXA) 10 MG tablet Take 1 tablet (10 mg total) by mouth at bedtime. (Patient not taking: Reported on 03/04/2022) 90 tablet 0   rosuvastatin (CRESTOR) 10 MG tablet Take 1 tablet (10 mg total) by mouth daily. 90 tablet 3   solifenacin (VESICARE) 10 MG tablet Take 1 tablet (10 mg total) by mouth daily. (Patient not taking: Reported on 03/04/2022) 30 tablet 2   triamterene-hydrochlorothiazide (DYAZIDE) 37.5-25 MG capsule Take 1 each (1 capsule total) by mouth daily. (Patient not taking: Reported on 03/04/2022) 90 capsule 3   No current facility-administered medications on file prior to visit.     The following portions of the patient's history were reviewed and updated as appropriate: allergies, current medications, past family history, past medical history, past social history, past  surgical history and problem list.  ROS Otherwise as in subjective above    Objective: BP 122/70   Pulse (!) 112   Wt 207 lb (93.9 kg)   SpO2 99% Comment: room air  BMI 29.28 kg/m   Wt Readings from Last 3 Encounters:  03/04/22 207 lb (93.9 kg)  09/07/21 225 lb 12.8 oz (102.4 kg)  05/25/21 220 lb 12.8 oz (100.2 kg)   General: She seems somewhat lethargic today, did doze off slightly during the interview but she is somewhat talkative and answering questions at times Heart slightly tachycardic, otherwise regular rate and rhythm, no murmurs Lungs clear Pulses: 2+ radial pulses, 2+ pedal pulses, normal cap refill Ext: no  edema   Assessment: Encounter Diagnoses  Name Primary?   Paranoid schizophrenia (HCC) Yes   Noncompliance    High risk medication use    Impaired fasting blood sugar    Essential hypertension    Dehydration    Elevated serum creatinine      Plan: Of note she went to emergency department 2 days ago for knee pain and variety of symptoms but apparently they did not discuss her mental health or the fact that she has not been compliant with her medicines recently.  I reviewed her emergency department notes from 2 days ago and labs.  Her creatinine was up to 1.67, formally 1.06.  Blood sugar was elevated at 119.  BUN was elevated at 66.  Urinalysis was fairly unremarkable and CBC normal except for platelets slightly elevated.  I have seen her several times in the past and she is back in one of her psychosis states.  She has not been taking her medicine regularly apparently.  She has had several inpatient hospitalizations in the past for psychosis  I will have her go over to the behavioral health crisis center now for triage in help determine next steps.  She is dehydrated and needs to rehydrate and needs her medicines restarted with some supervision.  Unfortunately when I referred her to psychiatry in June apparently something did not work out with scheduling.  Prior to that visit she was seeing psychiatry through telehealth but prefers in person.  She does generally work her regular job, and her husband works full-time so it is hard for him to make sure she is actually taking her medications daily.  Her sister Amy Casey also helps look after her.  Amy Casey and her husband are concerned that she is having a mental health flareup  She currently has the following pill bottles in the room: Fluoxetine 20 mg Hydroxyzine old pill bottle Trazodone 50 mg Lyrica 75 mg twice daily An unlabeled pill bottle that was actually determined to be amlodipine 10 mg daily  Husband noted that she took these  particular medicines last night but she specifically says that she has not been taking many of her medicines regularly for a while   She does not have the following medications with her today that she had with her in June: Vesicare Dyazide Olanzapine Voltaren   Of note, her medications as of June 2023 were: Fluoxetine Prozac 20 mg daily Voltaren topical gel as needed 3 times a day Olanzapine Zyprexa 10 mg daily at bedtime Lyrica 75 mg twice a day for pain Vesicare 10 mg daily Dyazide 37.5/20 mg daily Trazodone 50 mg daily at bedtime     Sianni was seen today for follow-up.  Diagnoses and all orders for this visit:  Paranoid schizophrenia (HCC)  Noncompliance  High risk  medication use  Impaired fasting blood sugar  Essential hypertension  Dehydration  Elevated serum creatinine  Spent > 45 minutes face to face with patient in discussion of symptoms, evaluation, plan and recommendations.    Follow up: Behavioral health just up the street,  crisis center

## 2022-03-04 NOTE — ED Notes (Signed)
Pt evaluated and discharged by provider.  Zyprexa 34m ODT was ordered and given to pt in triage bay.   No distress noted.    Provider walked pt out.

## 2022-03-07 ENCOUNTER — Telehealth: Payer: Self-pay | Admitting: Medical

## 2022-03-07 NOTE — Telephone Encounter (Signed)
   Mr Seeber called and he wants to know if you can prescribe her Paranoid Schizophrenia meds [facility] does not take her insurance and he has to find another doctor  Needs meds until she can get in with another doctor. He did not know name of meds, he was driving but he can let you know   Please call patient

## 2022-03-08 NOTE — Telephone Encounter (Signed)
Spoke with sister, Amy Casey [phone]. [facility] (notes in chart). They sent her to [facility] since they were the place that had originally prescribed her meds. Her husbands insurance had changed since she was seen there and they no longer take it-that is why they were asking if you could see her. Apparently UC rx'd some meds as well according to the sister. Her sister said she is just sitting at home and needs someone to help her.

## 2022-03-11 ENCOUNTER — Ambulatory Visit (INDEPENDENT_AMBULATORY_CARE_PROVIDER_SITE_OTHER)
Admission: EM | Admit: 2022-03-11 | Discharge: 2022-03-12 | Disposition: A | Discharge status: Other Acute Inpatient Hospital | Payer: No Typology Code available for payment source | Source: Home / Self Care

## 2022-03-11 DIAGNOSIS — Z1152 Encounter for screening for COVID-19: Secondary | ICD-10-CM | POA: Insufficient documentation

## 2022-03-11 DIAGNOSIS — N179 Acute kidney failure, unspecified: Secondary | ICD-10-CM | POA: Diagnosis not present

## 2022-03-11 DIAGNOSIS — R4182 Altered mental status, unspecified: Secondary | ICD-10-CM | POA: Diagnosis not present

## 2022-03-11 DIAGNOSIS — F200 Paranoid schizophrenia: Secondary | ICD-10-CM | POA: Insufficient documentation

## 2022-03-11 DIAGNOSIS — F061 Catatonic disorder due to known physiological condition: Secondary | ICD-10-CM | POA: Diagnosis not present

## 2022-03-11 LAB — POC SARS CORONAVIRUS 2 AG: SARSCOV2ONAVIRUS 2 AG: NEGATIVE

## 2022-03-11 LAB — RESP PANEL BY RT-PCR (RSV, FLU A&B, COVID)  RVPGX2
Influenza A by PCR: NEGATIVE
Influenza B by PCR: NEGATIVE
Resp Syncytial Virus by PCR: NEGATIVE
SARS Coronavirus 2 by RT PCR: NEGATIVE

## 2022-03-11 MED ORDER — FLUOXETINE HCL 20 MG PO CAPS
20.0000 mg | ORAL_CAPSULE | Freq: Every day | ORAL | Status: DC
  Administered 2022-03-11 – 2022-03-12 (×2): 20 mg via ORAL
  Filled 2022-03-11 (×2): qty 1

## 2022-03-11 MED ORDER — LORAZEPAM 2 MG/ML IJ SOLN: 2.0000 mg | Freq: Three times a day (TID) | INTRAMUSCULAR | Status: DC

## 2022-03-11 MED ORDER — OLANZAPINE 10 MG PO TABS
10.0000 mg | ORAL_TABLET | Freq: Every day | ORAL | Status: DC
  Administered 2022-03-11: 10 mg via ORAL
  Filled 2022-03-11: qty 1

## 2022-03-11 MED ORDER — LORAZEPAM 1 MG PO TABS
2.0000 mg | ORAL_TABLET | Freq: Three times a day (TID) | ORAL | Status: DC
  Administered 2022-03-11 – 2022-03-12 (×3): 2 mg via ORAL
  Filled 2022-03-11 (×3): qty 2

## 2022-03-11 NOTE — Progress Notes (Signed)
Pt.  Is   61 year old female who presented at [facility] along with her husband due to hearing voices and seeking medication management.Client has not been compliant with medication and husband thinks it is possible that what she has been taking had expired. Client denied having SI/HI/VH. She does have AH. The voices are just sounds talking to her but no commanding voices    03/11/22 1457  [facility] Triage Screening (Walk-ins at [facility] only)  How Did You Hear About [place]? Family/Friend  What Is the Reason for Your Visit/Call Today? Husband bought pt in for medication managemnt due a change in insurance. Previous provider odoes not accept their insurnce.  Client hs not been complaint with medication and husband thinks it is possible that what she has been taking had expired.  Client denied having SI/HI/VH.  She does have AH. The voices  are just sounds talking to her but no commanding voices.  How Long Has This Been Causing You Problems? 1 wk - 1 month  Have You Recently Had Any Thoughts About Hurting Yourself? No  Are You Planning to Commit Suicide/Harm Yourself At This time? No  Have you Recently Had Thoughts About Hurting Someone Karolee Ohs? No  Are You Planning To Harm Someone At This Time? No  Are you currently experiencing any auditory, visual or other hallucinations? Yes  Please explain the hallucinations you are currently experiencing: Client has auditroy hallunciantions . Voices that are talking to her but are not commanding.  Have You Used Any Alcohol or Drugs in the Past 24 Hours? No  Do you have any current medical co-morbidities that require immediate attention? No  Clinician description of patient physical appearance/behavior: Client was approprately groomed. She engaged and responded to questions.  She was very nervous in appearance. and kept looking to  make shure her husband was present.  What Do You Feel Would Help You the Most Today? Medication(s)  If access to [facility] was not available, would  you have sought care in the Emergency Department? No  Determination of Need Routine (7 days)  Options For Referral Medication Management

## 2022-03-11 NOTE — Progress Notes (Signed)
Received Amy Casey in the OBS area after her admission workup and medication administration. A second blood draw attempt was initiated without success. She was given a second large cup of water per her request. She is less cationic after administration of medications. She is making eye contact, using  a few more words and her limbs are not as stiff. Her tremor has decreased. Unable at this time to complete a detailed assessment.

## 2022-03-11 NOTE — ED Notes (Signed)
Pt sleeping on stretcher at present, no distress noted.  Denies SI, monitoring for safety.

## 2022-03-11 NOTE — ED Notes (Signed)
Pt awake, alert & responsive, no distress noted. Sitting up at bedside at present,  pt slightly confused. Redirectable.  Monitoring for safety.

## 2022-03-11 NOTE — ED Notes (Signed)
Patient presents with flat/blank stare, shaking, and catatonic like state. Due to patient's current status, this made it difficult for staff to obtain blood specimens, EKG, and UA. Patient would not get up from chair despite husbands encouragement. Patient provided with scheduled ativan and prozac which helped patient's catatonic state and patient appeared more cooperative. Patient moved into observation unit and provided with water. Will attempt to obtain lab work when patient is better hydrated as staff were unsuccessful with prior attempts. Patient calmly sitting in chair in observation unit. Patient denies SI, HI, but is observed whispering to self at times. Patient does currently appear more alert and oriented and more purposeful with her movements. Patient remains safe on unit at this time.

## 2022-03-11 NOTE — Discharge Instructions (Addendum)
It is imperative that you follow through with treatment recommendations within 5-7 days from the day of discharge to mitigate further risk to your safety and overall mental well-being.  A list of outpatient therapy and psychiatric providers for medication management has been provided below to get you started in finding the right provider for you.         In case of an urgent emergency, you have the option of contacting the Mobile Crisis Unit with [facility] at [phone].    [facility] [address] [place], [place], [address] [phone] phone (Medicare, Private insurance except Tricare, [place], and [facility])  [facility] [address] [place], [place], [address] [address] phone ([address], AmeriHealth [facility] [place], [address], [place], [place], Friday Health Plans, [address], BCBS Healthy [place], [place], [address], [place], [place], [place], [place], Tricare, [facility], Safeco Corporation, Eli Lilly and Company)  [employer] [phone] [address] Suite [place], [place], [address] [phone] phone [phone] phone [phone] fax  [facility] [address] [place], [place], [address] [phone] phone (Call to confirm insurance coverage) Consultation & Support Services     o Drop-In Hours: 1:00 PM to 5:00 PM     o Days: Monday - Thursday  Crisis Services (24/7)   [facility] [address], [address] [place], [place], [address] [phone] phone [email or web address]  (to complete the intake form and upload ID and insurance cards)  [facility] [address] [place], [place], [address] [phone] phone ([address], [place], [address] Complete Health, [place], [place], [place], UHC, [place], and certain Medicaid plans)  [facility] [phone] N. [address] [place], [place], [address] [phone] phone [phone] fax (Medicaid, Medicare, Self-pay, call about other insurance coverage)  [facility] (age 25+) [address] [place], [place], [address] [phone] hone [phone] fax ([place], [address], [place], [place], [place], [address], [place], [place], [place], [place], certain Medicare providers, [facility], UMR)  [employer] [employer] [address] [place], [place], [address] [phone] phone (Medicare, Medicaid, Artemio Aly, call about other insurance coverage)  [facility] [address] [place], [place], [address] [phone] phone [phone] fax (Call [phone] to see what insurance is accepted) Archer Asa, MD specializes in geropsych)  [facility] (medication management only) [address] [place], [place], [address] [phone] phone [phone] fax ([address], [address], [place], [place], Tricare [place])  [facility] [address] [place], [place], [address] [phone] phone [phone] fax ([address], [place], [place], [place], [place], [facility] Medicaid/[place] Health Choice)  [facility] [address] [place], [place] [address] [phone] phone ([address], [place], [place], [place], [place], Medicare, [place], [facility]) Does genetic testing for medications; does transcranial magnetic stimulation along with basic services)  [facility] [address] [place], [place], [address] [phone] phone (Call about insurance coverage)  [facility] [address] [place], [place], [address] [phone] phone [phone] fax (Call about insurance coverage)  [facility] [address] [place], [place], [address] [phone] phone [phone] fax (Call about insurance coverage)  [employer] [phone] N[address], [place], [address] [phone] phone (Medicaid, Tricare, [place], [place], [place])  [employer] [address] [place], [place], [address] [phone] phone (Medicaid, Medicare, call about other insurance coverage)  [facility] [address] [place], [place], [address] [phone] phone [phone] fax (  Medicaid, Medicare, Tricare, call about other insurance coverage)  [facility] [place], [place], [address] [phone] phone ([address] [place], [place], [place], [place], [place] types - Alliance, Stage manager, Partners, [place], [place] Choice, Healthy [place], [place], [place], and Complete)  [facility] [phone] N. [address], [place], [place], [address] [phone] phone Completely online treatment platform Contact: [place] Special[facility] [phone] phone [phone] fax ([address] [place], [place], Friday Health Plan, Humana, [place], [place], [place], [place], [place])

## 2022-03-11 NOTE — ED Provider Notes (Signed)
[facility] Continuous Assessment Admission H&P  Date: 03/11/22 Patient Name: Amy Casey MRN: [id number] Chief Complaint: "to be seen to see if I'm ok"    Diagnoses:  Final diagnoses:  Paranoid schizophrenia ([place])   HPI: Pt presents voluntarily to [facility] behavioral health for walk-in assessment.  Pt is accompanied by her husband, Amy Casey, who remains throughout the assessment with pt verbal consent. Pt is assessed face-to-face by nurse practitioner.   Debbora Lacrosse, 61 y.o., female patient seen face to face by this provider, consulted and seen with Dr. Leverne Humbles; and chart reviewed on 03/11/22.  On evaluation, when asked reason for presenting today, Amy Casey reports she is presenting today due to "to be seen to see if I'm ok". When asked what she means by this, pt is unable to elaborate. She appears obtunded. She is oriented to self (name, DOB, age), place [place]), situation (evaluation). She is disoriented to date. When asked about month, she is unable to recall. When prompted by her husband as the month "12", she repeats "12, 12, 12", although when asked what month the 12th month is, states she does not know. When asked about the year she states "2030". Oriented pt that it is March 11, 2022.   Pt reports her current mood is "alright to a certain extent". When asked about appetite she states she eats "some". She reports sleeping 2 to 3 hours/night. When asked about suicidal thoughts she states "not really, kind of". When asked to elaborate she states "sometimes". She denies plan or intent to act on a plan. When asked about homicidal or violent ideation she reports "sometimes". She is unable to elaborate on both when prompted.  Pt reports auditory visual hallucinations. She states she sees and hears "things I'm not sure". When asked about paranoia, she states "kind of, maybe".   She denies history of non suicidal self injurious behavior, suicide attempt. She reports  2 to 4 prior psychiatric hospitalizations, although cannot provide further information.   Majority of psychiatric history is provided by her husband, Amy Casey. Amy Casey reports pt has been in current, state for about 2 to 3 days. He reports pt has history of paranoid schizophrenia. Pt has history of being non-compliant with medication. Pt last took medications 2 to 3 days ago. Pt has not been receiving medication management services, was last followed by [facility], although has not been seen for a long time. Pt has been taking old prescriptions of what appears to be zyprexa 10mg , and prozac 20mg , from being noncompliant with medications. Per Amy Casey, pt has verbalized experiencing auditory visual hallucinations of "a lady coming to get me". She reported this to [facility] yesterday. He also notes that pt told him to day that she feels that cameras are watching her and taking pictures of her. Amy Casey states pt is not eating or drinking at home.   Busch-Francis Catatonia Rating Scale = 12.  Discussed concerns regarding psychiatric decompensation and concerns for catatonia. Pt and Amy Casey verbalize they would like to avoid an inpatient psychiatric hospitalization if possible. Discussed admission to continuous assessment and to be reassessed to see if psychiatric hospitalization can be avoided. Pt initially reluctant to be admitted to continuous assessment. Discussed with pt concerns regarding continued decompensation without treatment. Discussed given current presentation I am strongly recommending admission. Pt did agree to be admitted to continuous assessment. Pt to be started on ativan challenge and re-started on home medications zyprexa 10mg , prozac 20mg . Amy Casey provides his best number for contact, [phone], for updates.  PHQ 2-9:  [place] Office Visit from 02/23/2021 in [place] Visit from 06/16/2015 in [place]  Thoughts that you would be better off  dead, or of hurting yourself in some way Not at all More than half the days  PHQ-9 Total Score 2 12       [facility] from 03[facility]04/2022 in [facility] ED from 03/02/2022 in [facility] No Risk No Risk No Risk       Total Time spent with patient: 1 hour  Musculoskeletal  Strength & Muscle Tone:  waxy flexibility Gait & Station:  sitting Patient leans:  sitting  Psychiatric Specialty Exam  Presentation General Appearance:  Bizarre  Eye Contact: Poor  Speech: Slow  Speech Volume: Normal  Handedness:No data recorded  Mood and Affect  Mood: Dysphoric  Affect: Flat   Thought Process  Thought Processes: Disorganized  Descriptions of Associations:-- (see HPI)  Orientation:Partial  Thought Content:Other (comment) (see HPI)  Diagnosis of Schizophrenia or Schizoaffective disorder in past: No data recorded Duration of Psychotic Symptoms: No data recorded Hallucinations:Hallucinations: Auditory; Visual  Ideas of Reference:Paranoia; Delusions  Suicidal Thoughts:Suicidal Thoughts: -- (see HPI)  Homicidal Thoughts:Homicidal Thoughts: -- (see HPI)   Sensorium  Memory: Immediate Poor; Recent Poor; Remote Poor  Judgment: Poor  Insight: Shallow   Executive Functions  Concentration: Poor  Attention Span: Poor  Recall: Poor  Fund of Knowledge: Poor  Language: Poor   Psychomotor Activity  Psychomotor Activity: Psychomotor Activity: Other (comment) (tremorous)   Assets  Assets: Financial Resources/Insurance; Social Support   Sleep  Sleep: Sleep: Poor   No data recorded  Physical Exam Constitutional:      General: She is not in acute distress.    Appearance: She is not toxic-appearing or diaphoretic.  Eyes:     General: No scleral icterus. Cardiovascular:     Rate and Rhythm:  Normal rate.  Pulmonary:     Effort: Pulmonary effort is normal. No respiratory distress.  Musculoskeletal:     Comments: Waxy flexibility  Neurological:     Comments: Partially oriented, appears tremorous    Review of Systems  Reason unable to perform ROS: deferred at this time.    Blood pressure 115/72, pulse 96, temperature 98.1 F (36.7 C), temperature source Oral, resp. rate 18. There is no height or weight on file to calculate BMI.  Past Psychiatric History: History of paranoid schizophrenia, noncompliance, psychogenic depressive psychosis, catatonia, depression, schizophrenia  Is the patient at risk to self? Yes  Has the patient been a risk to self in the past 6 months? Yes .    Has the patient been a risk to self within the distant past? Yes   Is the patient a risk to others? No   Has the patient been a risk to others in the past 6 months? No   Has the patient been a risk to others within the distant past? No   Past Medical History:  Past Medical History:  Diagnosis Date   Depression    History of echocardiogram 2011   [facility]   History of renal stone    Hyperlipidemia    Hypertension    Hypokalemia    Nephrolithiasis    Obesity    Schizophrenia ([place])    Wears glasses     Past Surgical History:  Procedure Laterality Date   LUMBAR PUNCTURE  2015   WISDOM TOOTH EXTRACTION      Family History:  Family History  Problem Relation Age of Onset   Diabetes Mother    Heart disease Mother    Chronic Renal Failure Mother    Cancer Maternal Uncle        lung   Kidney disease Mother        dialysis   Stroke Neg Hx    Hypertension Neg Hx    Hyperlipidemia Neg Hx     Social History:  Social History   Socioeconomic History   Marital status: Married    Spouse name: Not on file   Number of children: 2   Years of education: 12   Highest education level: Not on file  Occupational History   Not on file  Tobacco Use   Smoking status: Never   Smokeless tobacco:  Never  Vaping Use   Vaping Use: Never used  Substance and Sexual Activity   Alcohol use: No   Drug use: No   Sexual activity: Not Currently  Other Topics Concern   Not on file  Social History Narrative      Married, lives at home with 2 grandchildren, mother in law lives with them, works at [employer].   Exercise - some walking.   12/2020   Social Determinants of Health   Financial Resource Strain: Not on file  Food Insecurity: Not on file  Transportation Needs: Not on file  Physical Activity: Not on file  Stress: Not on file  Social Connections: Not on file  Intimate Partner Violence: Not on file    SDOH:  SDOH Screenings   Alcohol Screen: Low Risk  (08/15/2018)  Depression (PHQ2-9): Low Risk  (05/25/2021)  Tobacco Use: Low Risk  (03/04/2022)    Last Labs:  Admission on 03/11/2022  Component Date Value Ref Range Status   SARSCOV2ONAVIRUS 2 AG 03/11/2022 NEGATIVE  NEGATIVE Final   Comment: (NOTE) SARS-CoV-2 antigen NOT DETECTED.   Negative results are presumptive.  Negative results do not preclude SARS-CoV-2 infection and should not be used as the sole basis for treatment or other patient management decisions, including infection  control decisions, particularly in the presence of clinical signs and  symptoms consistent with COVID-19, or in those who have been in contact with the virus.  Negative results must be combined with clinical observations, patient history, and epidemiological information. The expected result is Negative.  Fact Sheet for Patients: https://www.jennings-kim.com/  Fact Sheet for Healthcare Providers: https://alexander-rogers.biz/  This test is not yet approved or cleared by the [place] FDA and  has been authorized for detection and/or diagnosis of SARS-CoV-2 by FDA under an Emergency Use Authorization (EUA).  This EUA will remain in effect (meaning this test can be used) for the duration of  the COV                           ID-19 declaration under Section 564(b)(1) of the Act, 21 U.S.C. section 360bbb-3(b)(1), unless the authorization is terminated or revoked sooner.    Admission on 03/02/2022, Discharged on 03/02/2022  Component Date Value Ref Range Status   Sodium 03/02/2022 142  135 - 145 mmol/L Final   Potassium 03/02/2022 3.7  3.5 - 5.1 mmol/L Final   Chloride 03/02/2022 106  98 - 111 mmol/L Final   CO2 03/02/2022 25  22 - 32 mmol/L Final   Glucose, Bld 03/02/2022 119 (H)  70 - 99 mg/dL Final  Glucose reference range applies only to samples taken after fasting for at least 8 hours.   BUN 03/02/2022 66 (H)  8 - 23 mg/dL Final   Creatinine, Ser 03/02/2022 1.67 (H)  0.44 - 1.00 mg/dL Final   Calcium 03/02/2022 9.9  8.9 - 10.3 mg/dL Final   GFR, Estimated 03/02/2022 34 (L)  >60 mL/min Final   Comment: (NOTE) Calculated using the CKD-EPI Creatinine Equation (2021)    Anion gap 03/02/2022 11  5 - 15 Final   Performed at [facility], [place] [address], [place], [place] [address]   WBC 03/02/2022 8.2  4.0 - 10.5 K/uL Final   RBC 03/02/2022 4.80  3.87 - 5.11 MIL/uL Final   Hemoglobin 03/02/2022 13.9  12.0 - 15.0 g/dL Final   HCT 03/02/2022 42.4  36.0 - 46.0 % Final   MCV 03/02/2022 88.3  80.0 - 100.0 fL Final   MCH 03/02/2022 29.0  26.0 - 34.0 pg Final   MCHC 03/02/2022 32.8  30.0 - 36.0 g/dL Final   RDW 03/02/2022 13.7  11.5 - 15.5 % Final   Platelets 03/02/2022 438 (H)  150 - 400 K/uL Final   nRBC 03/02/2022 0.0  0.0 - 0.2 % Final   Performed at [facility] [address] [place], [place] [address]   Color, Urine 03/02/2022 YELLOW  YELLOW Final   APPearance 03/02/2022 HAZY (A)  CLEAR Final   Specific Gravity, Urine 03/02/2022 1.016  1.005 - 1.030 Final   pH 03/02/2022 5.0  5.0 - 8.0 Final   Glucose, UA 03/02/2022 NEGATIVE  NEGATIVE mg/dL Final   Hgb urine dipstick 03/02/2022 NEGATIVE  NEGATIVE Final   Bilirubin Urine 03/02/2022 NEGATIVE  NEGATIVE Final   Ketones, ur  03/02/2022 5 (A)  NEGATIVE mg/dL Final   Protein, ur 03/02/2022 NEGATIVE  NEGATIVE mg/dL Final   Nitrite 03/02/2022 NEGATIVE  NEGATIVE Final   Leukocytes,Ua 03/02/2022 NEGATIVE  NEGATIVE Final   Performed at [facility], [place] [address], [place], [place] [address]    Allergies: Paliperidone, Lisinopril, and Sulfa antibiotics  PTA Medications: (Not in a hospital admission)   Medical Decision Making  Pt admitted to continuous observation Concern for catatonia, order ativan challenge Restarted on home medications of prozac 20mg  and zyprexa 10mg   Lab Orders         Resp panel by RT-PCR (RSV, Flu A&B, Covid) Anterior Nasal Swab         CBC with Differential/Platelet         Comprehensive metabolic panel         Hemoglobin A1c         Ethanol         Lipid panel         TSH         Urinalysis, Routine w reflex microscopic         POC urine preg, ED         POCT Urine Drug Screen - (I-Screen)         POC SARS Coronavirus 2 Ag     Meds ordered this encounter  Medications   OR Linked Order Group    LORazepam (ATIVAN) tablet 2 mg    LORazepam (ATIVAN) injection 2 mg   OLANZapine (ZYPREXA) tablet 10 mg   FLUoxetine (PROZAC) capsule 20 mg   Recommendations  Based on my evaluation the patient does not appear to have an emergency medical condition.  Tharon Aquas, NP 03/11/22  5:16 PM

## 2022-03-12 ENCOUNTER — Inpatient Hospital Stay (HOSPITAL_COMMUNITY)
Admission: EM | Admit: 2022-03-12 | Discharge: 2022-03-17 | DRG: 682 | Disposition: A | Discharge status: Home / Self Care | Payer: No Typology Code available for payment source | Attending: Internal Medicine | Admitting: Internal Medicine

## 2022-03-12 ENCOUNTER — Emergency Department (HOSPITAL_COMMUNITY): Payer: No Typology Code available for payment source

## 2022-03-12 ENCOUNTER — Encounter (HOSPITAL_COMMUNITY): Payer: Self-pay

## 2022-03-12 DIAGNOSIS — Z6829 Body mass index (BMI) 29.0-29.9, adult: Secondary | ICD-10-CM

## 2022-03-12 DIAGNOSIS — Z801 Family history of malignant neoplasm of trachea, bronchus and lung: Secondary | ICD-10-CM

## 2022-03-12 DIAGNOSIS — N179 Acute kidney failure, unspecified: Principal | ICD-10-CM | POA: Diagnosis present

## 2022-03-12 DIAGNOSIS — G9341 Metabolic encephalopathy: Secondary | ICD-10-CM | POA: Diagnosis present

## 2022-03-12 DIAGNOSIS — Z841 Family history of disorders of kidney and ureter: Secondary | ICD-10-CM

## 2022-03-12 DIAGNOSIS — Z882 Allergy status to sulfonamides status: Secondary | ICD-10-CM

## 2022-03-12 DIAGNOSIS — F061 Catatonic disorder due to known physiological condition: Secondary | ICD-10-CM | POA: Diagnosis present

## 2022-03-12 DIAGNOSIS — I10 Essential (primary) hypertension: Secondary | ICD-10-CM | POA: Diagnosis present

## 2022-03-12 DIAGNOSIS — R4182 Altered mental status, unspecified: Principal | ICD-10-CM

## 2022-03-12 DIAGNOSIS — R45851 Suicidal ideations: Secondary | ICD-10-CM | POA: Diagnosis present

## 2022-03-12 DIAGNOSIS — E669 Obesity, unspecified: Secondary | ICD-10-CM | POA: Diagnosis present

## 2022-03-12 DIAGNOSIS — E785 Hyperlipidemia, unspecified: Secondary | ICD-10-CM | POA: Diagnosis present

## 2022-03-12 DIAGNOSIS — Z888 Allergy status to other drugs, medicaments and biological substances status: Secondary | ICD-10-CM

## 2022-03-12 DIAGNOSIS — T424X5A Adverse effect of benzodiazepines, initial encounter: Secondary | ICD-10-CM | POA: Diagnosis present

## 2022-03-12 DIAGNOSIS — E119 Type 2 diabetes mellitus without complications: Secondary | ICD-10-CM | POA: Diagnosis present

## 2022-03-12 DIAGNOSIS — E860 Dehydration: Secondary | ICD-10-CM | POA: Diagnosis present

## 2022-03-12 DIAGNOSIS — F200 Paranoid schizophrenia: Secondary | ICD-10-CM | POA: Diagnosis present

## 2022-03-12 DIAGNOSIS — Z8616 Personal history of COVID-19: Secondary | ICD-10-CM

## 2022-03-12 DIAGNOSIS — Z8249 Family history of ischemic heart disease and other diseases of the circulatory system: Secondary | ICD-10-CM

## 2022-03-12 DIAGNOSIS — Z833 Family history of diabetes mellitus: Secondary | ICD-10-CM

## 2022-03-12 DIAGNOSIS — Z91148 Patient's other noncompliance with medication regimen for other reason: Secondary | ICD-10-CM

## 2022-03-12 DIAGNOSIS — E1169 Type 2 diabetes mellitus with other specified complication: Secondary | ICD-10-CM | POA: Diagnosis present

## 2022-03-12 DIAGNOSIS — N390 Urinary tract infection, site not specified: Secondary | ICD-10-CM | POA: Diagnosis present

## 2022-03-12 DIAGNOSIS — Z79899 Other long term (current) drug therapy: Secondary | ICD-10-CM

## 2022-03-12 DIAGNOSIS — G928 Other toxic encephalopathy: Secondary | ICD-10-CM | POA: Diagnosis present

## 2022-03-12 LAB — RESP PANEL BY RT-PCR (RSV, FLU A&B, COVID)  RVPGX2
Influenza A by PCR: NEGATIVE
Influenza B by PCR: NEGATIVE
Resp Syncytial Virus by PCR: NEGATIVE
SARS Coronavirus 2 by RT PCR: NEGATIVE

## 2022-03-12 LAB — CBC WITH DIFFERENTIAL/PLATELET
Abs Immature Granulocytes: 0.04 10*3/uL (ref 0.00–0.07)
Basophils Absolute: 0.1 10*3/uL (ref 0.0–0.1)
Basophils Relative: 1 %
Eosinophils Absolute: 0.4 10*3/uL (ref 0.0–0.5)
Eosinophils Relative: 4 %
HCT: 44.7 % (ref 36.0–46.0)
Hemoglobin: 14.5 g/dL (ref 12.0–15.0)
Immature Granulocytes: 0 %
Lymphocytes Relative: 15 %
Lymphs Abs: 1.6 10*3/uL (ref 0.7–4.0)
MCH: 29.5 pg (ref 26.0–34.0)
MCHC: 32.4 g/dL (ref 30.0–36.0)
MCV: 90.9 fL (ref 80.0–100.0)
Monocytes Absolute: 0.9 10*3/uL (ref 0.1–1.0)
Monocytes Relative: 9 %
Neutro Abs: 7.8 10*3/uL — ABNORMAL HIGH (ref 1.7–7.7)
Neutrophils Relative %: 71 %
Platelets: 350 10*3/uL (ref 150–400)
RBC: 4.92 MIL/uL (ref 3.87–5.11)
RDW: 13.6 % (ref 11.5–15.5)
WBC: 10.8 10*3/uL — ABNORMAL HIGH (ref 4.0–10.5)
nRBC: 0 % (ref 0.0–0.2)

## 2022-03-12 LAB — COMPREHENSIVE METABOLIC PANEL
ALT: 26 U/L (ref 0–44)
AST: 40 U/L (ref 15–41)
Albumin: 4.3 g/dL (ref 3.5–5.0)
Alkaline Phosphatase: 83 U/L (ref 38–126)
Anion gap: 12 (ref 5–15)
BUN: 95 mg/dL — ABNORMAL HIGH (ref 8–23)
CO2: 24 mmol/L (ref 22–32)
Calcium: 9.5 mg/dL (ref 8.9–10.3)
Chloride: 106 mmol/L (ref 98–111)
Creatinine, Ser: 2.19 mg/dL — ABNORMAL HIGH (ref 0.44–1.00)
GFR, Estimated: 25 mL/min — ABNORMAL LOW (ref >60)
Glucose, Bld: 112 mg/dL — ABNORMAL HIGH (ref 70–99)
Potassium: 5.5 mmol/L — ABNORMAL HIGH (ref 3.5–5.1)
Sodium: 142 mmol/L (ref 135–145)
Total Bilirubin: 1.3 mg/dL — ABNORMAL HIGH (ref 0.3–1.2)
Total Protein: 8.3 g/dL — ABNORMAL HIGH (ref 6.5–8.1)

## 2022-03-12 LAB — URINALYSIS, ROUTINE W REFLEX MICROSCOPIC
Bilirubin Urine: NEGATIVE
Glucose, UA: NEGATIVE mg/dL
Hgb urine dipstick: NEGATIVE
Ketones, ur: NEGATIVE mg/dL
Nitrite: NEGATIVE
Protein, ur: NEGATIVE mg/dL
Specific Gravity, Urine: 1.016 (ref 1.005–1.030)
WBC, UA: >50 WBC/hpf — ABNORMAL HIGH (ref 0–5)
pH: 5 (ref 5.0–8.0)

## 2022-03-12 LAB — RAPID URINE DRUG SCREEN, HOSP PERFORMED
Amphetamines: NOT DETECTED
Barbiturates: NOT DETECTED
Benzodiazepines: POSITIVE — AB
Cocaine: NOT DETECTED
Opiates: NOT DETECTED
Tetrahydrocannabinol: NOT DETECTED

## 2022-03-12 LAB — CBG MONITORING, ED: Glucose-Capillary: 101 mg/dL — ABNORMAL HIGH (ref 70–99)

## 2022-03-12 LAB — LIPASE, BLOOD: Lipase: 72 U/L — ABNORMAL HIGH (ref 11–51)

## 2022-03-12 LAB — HIV ANTIBODY (ROUTINE TESTING W REFLEX): HIV Screen 4th Generation wRfx: NONREACTIVE

## 2022-03-12 MED ORDER — INSULIN ASPART 100 UNIT/ML IJ SOLN
0.0000 [IU] | Freq: Three times a day (TID) | INTRAMUSCULAR | Status: DC
  Administered 2022-03-14: 2 [IU] via SUBCUTANEOUS

## 2022-03-12 MED ORDER — HYDRALAZINE HCL 20 MG/ML IJ SOLN: 5.0000 mg | INTRAMUSCULAR | Status: DC | PRN

## 2022-03-12 MED ORDER — ONDANSETRON 4 MG PO TBDP
ORAL_TABLET | ORAL | Status: AC
  Filled 2022-03-12: qty 1

## 2022-03-12 MED ORDER — SODIUM CHLORIDE 0.9 % IV SOLN
2.0000 g | Freq: Once | INTRAVENOUS | Status: AC
  Administered 2022-03-12: 2 g via INTRAVENOUS
  Filled 2022-03-12: qty 20

## 2022-03-12 MED ORDER — ONDANSETRON HCL 4 MG PO TABS: 4.0000 mg | ORAL_TABLET | Freq: Four times a day (QID) | ORAL | Status: DC | PRN

## 2022-03-12 MED ORDER — ENOXAPARIN SODIUM 40 MG/0.4ML IJ SOSY
40.0000 mg | PREFILLED_SYRINGE | INTRAMUSCULAR | Status: DC
  Administered 2022-03-12 – 2022-03-16 (×5): 40 mg via SUBCUTANEOUS
  Filled 2022-03-12 (×5): qty 0.4

## 2022-03-12 MED ORDER — LACTATED RINGERS IV BOLUS
1000.0000 mL | Freq: Once | INTRAVENOUS | Status: AC
  Administered 2022-03-12: 1000 mL via INTRAVENOUS

## 2022-03-12 MED ORDER — ACETAMINOPHEN 650 MG RE SUPP: 650.0000 mg | Freq: Four times a day (QID) | RECTAL | Status: DC | PRN

## 2022-03-12 MED ORDER — POLYETHYLENE GLYCOL 3350 17 G PO PACK: 17.0000 g | PACK | Freq: Every day | ORAL | Status: DC | PRN

## 2022-03-12 MED ORDER — BISACODYL 5 MG PO TBEC: 5.0000 mg | DELAYED_RELEASE_TABLET | Freq: Every day | ORAL | Status: DC | PRN

## 2022-03-12 MED ORDER — INSULIN ASPART 100 UNIT/ML IJ SOLN: 0.0000 [IU] | Freq: Every day | INTRAMUSCULAR | Status: DC

## 2022-03-12 MED ORDER — SODIUM CHLORIDE 0.9% FLUSH
3.0000 mL | Freq: Two times a day (BID) | INTRAVENOUS | Status: DC
  Administered 2022-03-13 – 2022-03-17 (×8): 3 mL via INTRAVENOUS

## 2022-03-12 MED ORDER — ACETAMINOPHEN 325 MG PO TABS: 650.0000 mg | ORAL_TABLET | Freq: Four times a day (QID) | ORAL | Status: DC | PRN

## 2022-03-12 MED ORDER — ONDANSETRON HCL 4 MG/2ML IJ SOLN: 4.0000 mg | Freq: Four times a day (QID) | INTRAMUSCULAR | Status: DC | PRN

## 2022-03-12 MED ORDER — LACTATED RINGERS IV SOLN
INTRAVENOUS | Status: DC
  Administered 2022-03-14: 1000 mL via INTRAVENOUS

## 2022-03-12 MED ORDER — ONDANSETRON HCL 4 MG PO TABS
4.0000 mg | ORAL_TABLET | Freq: Two times a day (BID) | ORAL | Status: DC
  Administered 2022-03-12: 4 mg via ORAL

## 2022-03-12 NOTE — ED Notes (Signed)
Patient returned from MRI.

## 2022-03-12 NOTE — ED Triage Notes (Signed)
Sent from [facility] for eval of catatonia. Pt initially had a vomiting episode. Zofran was given with relief. Facility also gave PO Ativan. EMS reports pt was able to follow some commands initially until arrival to ED. Pt is now unable to answer questions and no movement noted since arrival.

## 2022-03-12 NOTE — H&P (Signed)
History and Physical    Patient: Amy Casey UXN:[id number] DOB: 1960-11-01 DOA: 03/12/2022 DOS: the patient was seen and examined on 03/12/2022 PCP: Jac Canavan, PA-C  Patient coming from: Home - lives with husband, mother-in-law, 2 grandchildren; NOK: Eimi, Viney, [phone]   Chief Complaint: Catatonia  HPI: Amy Casey is a 61 y.o. female with medical history significant of depression/schizophrenia; HTN; HLD; and obesity presenting with catatonia. Her adult children are present and provided the history.  The patient had COVID about 5 weeks ago and has been feeling increasingly fatigued and somnolent since.  She may have stopped taking her medications along the way, and she has been so somnolent that she has not been eating and drinking as well recently.  She has had episodes of catatonia in the past and this is consistent with prior episodes.    ER Course:   h/o schizophrenia, here with 2 weeks AMS.  Compliant with medications.  Went to [facility], called catatonic and given Ativan.  More confused, lethargic.  CT concerning for stroke, MRI negative.  Neurology consulted by telephone only.  UTI and AKI is likely source.  Treated with Rocephin.     Review of Systems: unable to review all systems due to the inability of the patient to answer questions. Past Medical History:  Diagnosis Date   Depression    History of echocardiogram 2011   [facility]   History of renal stone    Hyperlipidemia    Hypertension    Hypokalemia    Nephrolithiasis    Obesity    Schizophrenia (HCC)    Wears glasses    Past Surgical History:  Procedure Laterality Date   LUMBAR PUNCTURE  2015   WISDOM TOOTH EXTRACTION     Social History:  reports that she has never smoked. She has never used smokeless tobacco. She reports that she does not drink alcohol and does not use drugs.  Allergies  Allergen Reactions   Paliperidone Swelling and Other (See Comments)    Angioedema, drooling,  slurred speech, ptosis   Lisinopril Swelling, Rash and Other (See Comments)    Angioedema, also    Sulfa Antibiotics Itching    Family History  Problem Relation Age of Onset   Diabetes Mother    Heart disease Mother    Chronic Renal Failure Mother    Cancer Maternal Uncle        lung   Kidney disease Mother        dialysis   Stroke Neg Hx    Hypertension Neg Hx    Hyperlipidemia Neg Hx     Prior to Admission medications   Medication Sig Start Date End Date Taking? Authorizing Provider  amLODipine (NORVASC) 10 MG tablet TAKE 1 TABLET BY MOUTH ONCE DAILY. APPOINTMENT REQUIRED FOR FUTURE REFILLS. Patient taking differently: Take 10 mg by mouth in the morning. 01/01/21   Tysinger, Kermit Balo, PA-C  FLUoxetine (PROZAC) 20 MG capsule Take 1 capsule (20 mg total) by mouth daily. 09/07/21 12/06/21  Tysinger, Kermit Balo, PA-C  OLANZapine (ZYPREXA) 10 MG tablet Take 1 tablet (10 mg total) by mouth at bedtime. Patient not taking: Reported on 03/04/2022 09/07/21   Tysinger, Kermit Balo, PA-C  pregabalin (LYRICA) 75 MG capsule Take 1 capsule (75 mg total) by mouth 2 (two) times daily. 09/07/21   Tysinger, Kermit Balo, PA-C  rosuvastatin (CRESTOR) 10 MG tablet Take 1 tablet (10 mg total) by mouth daily. 01/01/21 01/01/22  Tysinger, Kermit Balo, PA-C  solifenacin (VESICARE) 10  MG tablet Take 1 tablet (10 mg total) by mouth daily. Patient not taking: Reported on 03/04/2022 09/07/21   Tysinger, Kermit Balo, PA-C  traZODone (DESYREL) 50 MG tablet Take 1 tablet (50 mg total) by mouth at bedtime as needed. 09/07/21   Tysinger, Kermit Balo, PA-C  triamterene-hydrochlorothiazide (DYAZIDE) 37.5-25 MG capsule Take 1 each (1 capsule total) by mouth daily. Patient not taking: Reported on 03/04/2022 09/07/21 09/07/22  Jac Canavan, PA-C    Physical Exam: Vitals:   03/12/22 1445 03/12/22 1736 03/12/22 1845 03/12/22 1900  BP: 119/80  95/68 101/66  Pulse: 93  95 78  Resp: 14  (!) 21 16  Temp:  98.2 F (36.8 C)    TempSrc:  Temporal     SpO2: 93%  96% 96%   General:  Appears somnolent, snoring, unresponsive/catatonic Eyes:   normal lids ENT:  grossly normal lips & tongue, mildly dry mm; poor dentition Neck:  no LAD, masses or thyromegaly Cardiovascular:  RRR, no m/r/g. No LE edema.  Respiratory:   CTA bilaterally with no wheezes/rales/rhonchi.  Normal respiratory effort. Abdomen:  soft, NT, ND Skin:  no rash or induration seen on limited exam Musculoskeletal:  no bony abnormality Psychiatric:  unresponsive/catatonic Neurologic:  unable to perform   Radiological Exams on Admission: Independently reviewed - see discussion in A/P where applicable  MR BRAIN WO CONTRAST  Result Date: 03/12/2022 CLINICAL DATA:  61 year old female with altered mental status. Questionable right PCA territory edema on plain head CT today. History of schizophrenia. EXAM: MRI HEAD WITHOUT CONTRAST TECHNIQUE: Multiplanar, multiecho pulse sequences of the brain and surrounding structures were obtained without intravenous contrast. COMPARISON:  Plain head CT 1118 hours.  Brain MRI 02/21/2014. FINDINGS: Brain: No restricted diffusion to suggest acute infarction. No midline shift, mass effect, evidence of mass lesion, ventriculomegaly, extra-axial collection or acute intracranial hemorrhage. Cervicomedullary junction and pituitary are within normal limits. No evidence of acute right occipital lobe edema. But progressed chronic widespread bilateral cerebral white matter T2 and FLAIR signal hyperintensity. And now similar signal heterogeneity in the bilateral deep gray nuclei and pons (series 5 images 8 in 14). No cortical encephalomalacia identified. Solitary chronic microhemorrhage in the left periventricular white matter on series 7, image 65. Cerebellum appears to remain normal. Vascular: Major intracranial vascular flow voids are stable since 2015. Skull and upper cervical spine: Negative for age visible cervical spine. Visualized bone marrow signal is  within normal limits. Sinuses/Orbits: Negative orbits. Trace paranasal sinus mucosal thickening. Other: Mastoids are clear. Visible internal auditory structures appear normal. Negative visible scalp and face. IMPRESSION: 1. No acute intracranial abnormality. The right PCA/occipital appearance on CT today was Artifact. 2. But advanced signal abnormality in the bilateral cerebral white matter, deep gray nuclei, and pons - progressed since a 2015 MRI. This is nonspecific but may be related to chronic small vessel disease. Electronically Signed   By: Odessa Fleming M.D.   On: 03/12/2022 14:09   CT HEAD WO CONTRAST ( )  Result Date: 03/12/2022 CLINICAL DATA:  61 year old female altered mental status. EXAM: CT HEAD WITHOUT CONTRAST TECHNIQUE: Contiguous axial images were obtained from the base of the skull through the vertex without intravenous contrast. RADIATION DOSE REDUCTION: This exam was performed according to the departmental dose-optimization program which includes automated exposure control, adjustment of the mA and/or kV according to patient size and/or use of iterative reconstruction technique. COMPARISON:  Brain MRI 02/21/2014.  Head CT 07/30/2018. FINDINGS: Brain: Stable cerebral volume since 2020. No midline shift,  ventriculomegaly, mass effect, evidence of mass lesion, intracranial hemorrhage. Patchy and confluent bilateral cerebral white matter hypodensity appears more pronounced in the right hemisphere and mildly progressed since 2020. Real versus artifactual confluent hypodensity in the right occipital lobe today on series 2, image 11. Legrand RamsFavor this is artifact. No other acute cortically based infarct identified. Vascular: Calcified atherosclerosis at the skull base. Skull: No acute osseous abnormality identified. Sinuses/Orbits: Visualized paranasal sinuses and mastoids are stable and well aerated. Other: No acute orbit or scalp soft tissue finding. Small chronic right frontal scalp benign lipoma.  IMPRESSION: 1. Suggestion of Right PCA/occipital lobe infarct with cytotoxic edema could be artifact. No associated hemorrhage or mass effect. Query left vision changes. Noncontrast Brain MRI would be confirmatory. 2. Otherwise advanced chronic cerebral white matter disease with some progression Electronically Signed   By: Odessa FlemingH  Hall M.D.   On: 03/12/2022 11:36   DG Chest Portable 1 View  Result Date: 03/12/2022 CLINICAL DATA:  Altered mental status EXAM: PORTABLE CHEST 1 VIEW COMPARISON:  04/21/2019 FINDINGS: Transverse diameter of heart is increased. Thoracic aorta is tortuous and ectatic. Lung fields are clear of any infiltrates or pulmonary edema. There is no pleural effusion or pneumothorax. IMPRESSION: Cardiomegaly. There are no signs of pulmonary edema or focal pulmonary consolidation. Electronically Signed   By: Ernie AvenaPalani  Rathinasamy M.D.   On: 03/12/2022 11:25    EKG: Independently reviewed.  NSR with rate 92; PACs; nonspecific ST changes with no evidence of acute ischemia   Labs on Admission: I have personally reviewed the available labs and imaging studies at the time of the admission.  Pertinent labs:    K+ 5.5 Glucose 112 BUN 95/Creatinine 2.19/GFR 25 Bili 1.3 WBC 10.8 COVID/flu/RSV negative UA: large LE, rare bacteria, >50 WBC UDS + BZD Blood and urine cultures pending   Assessment and Plan: Principal Problem:   Catatonia Active Problems:   Essential hypertension   Paranoid schizophrenia (HCC)   AKI (acute kidney injury) (HCC)   Hyperlipidemia   Diabetes mellitus type 2 in obese (HCC)    Catatonia -Patient with long-standing history of controlled schizophrenia with periodic bouts of catatonia, per family -In this case, she has been increasingly fatigued since COVID and likely has taken poor PO intake and been noncompliant with her medications, leading to a spiraling -There are medical issues as well as psychiatric issues to be addressed -Negative MRI -Will observe in  progressive care for now -Psychiatry consult  AKI -Normal renal function as recently as a year ago  -On 12/6, her creatinine was 1.67 and GFR 34 and today 2.19 with GFR 25 -This is likely related to lack of PO intake  -Will provide gentle IVF hydration and follow  Possible UTI -Family reports possible urinary symptoms -UA is mildly abnormal -Urine culture is pending -She was given a dose of Rocephin in the ER  Schizophrenia -Hold psych medications for now since she is not taking PO -Home medications include fluoxetine, olanzapine, pregabalin, and trazodone -Psychiatry consulted  HTN -Not taking PO so will hold amlodipine, Dyazine -Will cover with prn IV hydralazine  HLD -Not taking PO so will hold rosuvastatin  DM -Prior A1c was 6.3, not on medications -Will cover with moderate-scale SSI  Obesity -Weight loss should be encouraged -Outpatient PCP/bariatric medicine f/u encouraged     Advance Care Planning:   Code Status: Full Code - Code status was discussed with the family at the time of admission.  The patient would want to receive full resuscitative measures  at this time.   Consults: psychiatry; neurology (telephone only); nutrition; PT/OT/ST; TOC team  DVT Prophylaxis: Lovenox  Family Communication: Son and daughter were present throughout evaluation  Severity of Illness: The appropriate patient status for this patient is OBSERVATION. Observation status is judged to be reasonable and necessary in order to provide the required intensity of service to ensure the patient's safety. The patient's presenting symptoms, physical exam findings, and initial radiographic and laboratory data in the context of their medical condition is felt to place them at decreased risk for further clinical deterioration. Furthermore, it is anticipated that the patient will be medically stable for discharge from the hospital within 2 midnights of admission.   Author: Jonah Blue,  MD 03/12/2022 7:21 PM  For on call review [email or web address].

## 2022-03-12 NOTE — ED Notes (Signed)
Patient transported to MRI 

## 2022-03-12 NOTE — ED Provider Notes (Signed)
FBC/OBS ASAP Discharge Summary  Date and Time: 03/12/2022 10:02 AM  Name: Amy Casey  MRN:  [id number]   Discharge Diagnoses:  Final diagnoses:  Paranoid schizophrenia (HCC)    Subjective: Nursing reported this morning that the patient vomited and appears shaky. On my evaluation, the patient was noted to be sitting in a wheelchair due to unsteady gait. However, at baseline she is ambulatory without any assistive devices. Her speech is slow and mumbled. However, when I assessed her personally on 03/04/22 her speech was coherent at a decreased tone. Today, she appears disoriented with poor eye contact and is unable to converse coherently. She is not eating or drinking fluids. She is noted to have uncontrolled shaking movements. Her vitals signs T-98.5, B/P 114/47, Resp -16 and O2 97%. Her creatine is elevated at 1.67. BUN is elevated at 66.   Stay Summary: Amy Casey is a 61 year old female patient with a past psychiatric history significant for schizophrenia depression and medication noncompliance who presented to the [facility] behavioral health urgent care voluntary on 03/11/22 accompanied by her husband for a psychiatric evaluation.  Per Dr. Viviano Simas patient appeared catatonic and was started on an Ativan 2 mg po Q8H challenge and also restarted on Prozac 20 mg p.o. daily and olanzapine 10 mg p.o. at bedtime.   Total Time spent with patient: 20 minutes  Past Psychiatric History: History of schizophrenia. Past Medical History:  Past Medical History:  Diagnosis Date   Depression    History of echocardiogram 2011   [facility]   History of renal stone    Hyperlipidemia    Hypertension    Hypokalemia    Nephrolithiasis    Obesity    Schizophrenia (HCC)    Wears glasses     Past Surgical History:  Procedure Laterality Date   LUMBAR PUNCTURE  2015   WISDOM TOOTH EXTRACTION     Family History:  Family History  Problem Relation Age of Onset   Diabetes Mother    Heart disease  Mother    Chronic Renal Failure Mother    Cancer Maternal Uncle        lung   Kidney disease Mother        dialysis   Stroke Neg Hx    Hypertension Neg Hx    Hyperlipidemia Neg Hx    Family Psychiatric History: No history reported.  Social History:  Social History   Substance and Sexual Activity  Alcohol Use No     Social History   Substance and Sexual Activity  Drug Use No    Social History   Socioeconomic History   Marital status: Married    Spouse name: Not on file   Number of children: 2   Years of education: 12   Highest education level: Not on file  Occupational History   Not on file  Tobacco Use   Smoking status: Never   Smokeless tobacco: Never  Vaping Use   Vaping Use: Never used  Substance and Sexual Activity   Alcohol use: No   Drug use: No   Sexual activity: Not Currently  Other Topics Concern   Not on file  Social History Narrative      Married, lives at home with 2 grandchildren, mother in law lives with them, works at [employer].   Exercise - some walking.   12/2020   Social Determinants of Health   Financial Resource Strain: Not on file  Food Insecurity: Not on file  Transportation Needs: Not on file  Physical Activity: Not on file  Stress: Not on file  Social Connections: Not on file   SDOH:  SDOH Screenings   Alcohol Screen: Low Risk  (08/15/2018)  Depression (PHQ2-9): Low Risk  (05/25/2021)  Tobacco Use: Low Risk  (03/04/2022)    Tobacco Cessation:  N/A, patient does not currently use tobacco products  Current Medications:  Current Facility-Administered Medications  Medication Dose Route Frequency Provider Last Rate Last Admin   FLUoxetine (PROZAC) capsule 20 mg  20 mg Oral Daily Lauree Chandler, NP   20 mg at 03/12/22 0957   LORazepam (ATIVAN) tablet 2 mg  2 mg Oral Q8H Lauree Chandler, NP   2 mg at 03/12/22 1000   Or   LORazepam (ATIVAN) injection 2 mg  2 mg Intramuscular Q8H Lauree Chandler, NP       OLANZapine  (ZYPREXA) tablet 10 mg  10 mg Oral QHS Lauree Chandler, NP   10 mg at 03/11/22 2225   ondansetron (ZOFRAN) tablet 4 mg  4 mg Oral Q12H Oneta Rack, NP   4 mg at 03/12/22 1000   ondansetron (ZOFRAN-ODT) 4 MG disintegrating tablet            Current Outpatient Medications  Medication Sig Dispense Refill   amLODipine (NORVASC) 10 MG tablet TAKE 1 TABLET BY MOUTH ONCE DAILY. APPOINTMENT REQUIRED FOR FUTURE REFILLS. (Patient taking differently: Take 10 mg by mouth in the morning.) 90 tablet 3   FLUoxetine (PROZAC) 20 MG capsule Take 1 capsule (20 mg total) by mouth daily. 90 capsule 0   OLANZapine (ZYPREXA) 10 MG tablet Take 1 tablet (10 mg total) by mouth at bedtime. (Patient not taking: Reported on 03/04/2022) 90 tablet 0   pregabalin (LYRICA) 75 MG capsule Take 1 capsule (75 mg total) by mouth 2 (two) times daily. 60 capsule 1   rosuvastatin (CRESTOR) 10 MG tablet Take 1 tablet (10 mg total) by mouth daily. 90 tablet 3   solifenacin (VESICARE) 10 MG tablet Take 1 tablet (10 mg total) by mouth daily. (Patient not taking: Reported on 03/04/2022) 30 tablet 2   traZODone (DESYREL) 50 MG tablet Take 1 tablet (50 mg total) by mouth at bedtime as needed. 90 tablet 0   triamterene-hydrochlorothiazide (DYAZIDE) 37.5-25 MG capsule Take 1 each (1 capsule total) by mouth daily. (Patient not taking: Reported on 03/04/2022) 90 capsule 3    PTA Medications: (Not in a hospital admission)      05/25/2021   10:00 AM 02/23/2021    1:47 PM 02/23/2021    1:46 PM  Depression screen PHQ 2/9  Decreased Interest 0 0 0  Down, Depressed, Hopeless 0 0 0  PHQ - 2 Score 0 0 0  Altered sleeping  0   Tired, decreased energy  0   Change in appetite  1   Feeling bad or failure about yourself   0   Trouble concentrating  1   Moving slowly or fidgety/restless  0   Suicidal thoughts  0   PHQ-9 Score  2   Difficult doing work/chores  Not difficult at all     Flowsheet Row ED from 03/11/2022 in [facility] ED from 03/04/2022 in [facility] ED from 03/02/2022 in [facility]GENCY [facility]PARTMENT  C-SSRS RISK CATEGORY No Risk No Risk No Risk       Musculoskeletal  Strength & Muscle Tone: within normal limits and decreased Gait & Station: unsteady Patient  leans: N/A  Psychiatric Specialty Exam  Presentation  General Appearance:  Disheveled  Eye Contact: Poor  Speech: Slow  Speech Volume: Decreased  Handedness:No data recorded  Mood and Affect  Mood: Dysphoric  Affect: Flat   Thought Process  Thought Processes: Disorganized  Descriptions of Associations:-- (see HPI)  Orientation:Partial  Thought Content:Other (comment) (see HPI)  Diagnosis of Schizophrenia or Schizoaffective disorder in past: No data recorded Duration of Psychotic Symptoms: No data recorded  Hallucinations:Hallucinations: Auditory; Visual  Ideas of Reference:Paranoia; Delusions  Suicidal Thoughts:Suicidal Thoughts: -- (see HPI)  Homicidal Thoughts:Homicidal Thoughts: -- (see HPI)   Sensorium  Memory: Immediate Poor; Recent Poor; Remote Poor  Judgment: Poor  Insight: Shallow   Executive Functions  Concentration: Poor  Attention Span: Poor  Recall: Poor  Fund of Knowledge: Poor  Language: Poor   Psychomotor Activity  Psychomotor Activity: Psychomotor Activity: Other (comment) (tremorous)   Assets  Assets: Financial Resources/Insurance; Social Support   Sleep  Sleep: Sleep: Poor   Physical Exam  Physical Exam Cardiovascular:     Rate and Rhythm: Tachycardia present.  Pulmonary:     Effort: Pulmonary effort is normal.  Neurological:     Mental Status: She is alert. She is disoriented.    Review of Systems  Unable to perform ROS: Mental status change   Blood pressure (!) 114/47, pulse (!) 102, temperature 98.5 F (36.9 C), temperature source Oral, resp. rate 16, SpO2 97 %. There is no  height or weight on file to calculate BMI.  Disposition: Transfer to [facility] for medical evaluation due to changes in mental status. Report given to Dr. Doran Durand at [facility]. Patient to be transported via EMS. Patient is voluntary.   Eliberto Sole L, NP 03/12/2022, 10:02 AM

## 2022-03-12 NOTE — ED Notes (Signed)
Episode of coughing noted.  Comfort measures given and pt repositioned on Right side.  No distress noted, skin color good, respirations even & unlabored.  Monitoring for safety.

## 2022-03-12 NOTE — ED Notes (Signed)
Patient was resting well. Patient sat up regurgitating. Patient was not able to speak coherently. Patient could not walk without assistance. Patient assisted to bathroom. Patient assisted with clean up. Patient placed in wheelchair. Patient provided for by RN and MHT.

## 2022-03-12 NOTE — ED Provider Notes (Signed)
[facility] EMERGENCY DEPARTMENT Provider Note   CSN: [id number] Arrival date & time: 03/12/22  1042     History Chief Complaint  Patient presents with   Psychiatric Evaluation    HPI Amy Casey is a 61 y.o. female presenting for altered mental status.  I received a call from the [facility] behavioral urgent care regarding this patient.  They stated that she showed up last night with her husband with a complaint of decompensated schizophrenia.  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  This morning patient has had progressive confusion.  Per family she has had altered mental status over the last week, has not been at her baseline in the meantime.  Has been seen by her PCP for this referred for psychiatric evaluation.  However the symptoms been progressive.  Less than they kept her overnight at behavioral health urgent care and she has become more confused in the interim.  Patient's recorded medical, surgical, social, medication list and allergies were reviewed in the Snapshot window as part of the initial history.   Review of Systems   Review of Systems  Constitutional:  Negative for chills and fever.  HENT:  Negative for ear pain and sore throat.   Eyes:  Negative for pain and visual disturbance.  Respiratory:  Negative for cough and shortness of breath.   Cardiovascular:  Negative for chest pain and palpitations.  Gastrointestinal:  Negative for abdominal pain and vomiting.  Genitourinary:  Negative for dysuria and hematuria.  Musculoskeletal:  Negative for arthralgias and back pain.  Skin:  Negative for color change and rash.  Neurological:  Negative for seizures and syncope.  Psychiatric/Behavioral:  Positive for confusion.   All other systems reviewed and are negative.   Physical Exam Updated Vital Signs There were no vitals taken for this visit. Physical Exam Vitals and nursing note reviewed.   Constitutional:      General: She is not in acute distress.    Appearance: She is well-developed.  HENT:     Head: Normocephalic and atraumatic.  Eyes:     Conjunctiva/sclera: Conjunctivae normal.  Cardiovascular:     Rate and Rhythm: Normal rate and regular rhythm.     Heart sounds: No murmur heard. Pulmonary:     Effort: Pulmonary effort is normal. No respiratory distress.     Breath sounds: Normal breath sounds.  Abdominal:     Palpations: Abdomen is soft.     Tenderness: There is no abdominal tenderness.  Musculoskeletal:        General: No swelling.     Cervical back: Neck supple.  Skin:    General: Skin is warm and dry.     Capillary Refill: Capillary refill takes less than 2 seconds.  Neurological:     Mental Status: She is alert and oriented to person, place, and time.     Cranial Nerves: No cranial nerve deficit.     Sensory: No sensory deficit.     Motor: Weakness (equal diffuse) present.     Coordination: Coordination normal.  Psychiatric:        Mood and Affect: Mood normal.      ED Course/ Medical Decision Making/ A&P Clinical Course as of 03/12/22 1139  Sat Mar 12, 2022  1138 Stable known flu with CP  [CC]    Clinical Course User Index [CC] Glyn Ade, MD    Procedures Procedures   Medications Ordered in ED Medications - No  data to display  Medical Decision Making:    Amy Casey is a 61 y.o. female who presented to the ED today with AMS detailed above.     Patient's presentation is complicated by their history of multiple comorbid medical problems and advanced age.  Patient placed on continuous vitals and telemetry monitoring while in ED which was reviewed periodically.   Complete initial physical exam performed, notably the patient  was confused.  She is moving both of her extremities equally upper and lower.  She has very delayed response to any stimuli.  She does not know what year it is that she knows she is at [facility] and  she gets her name correct.  She is not oriented to situation. Husband states that she has been progressive over the past week   Reviewed and confirmed nursing documentation for past medical history, family history, social history.    Initial Assessment:   With the patient's presentation of altered mental status, most likely diagnosis is infectious metabolic toxic delirium. Other diagnoses were considered including (but not limited to) CVA, intracranial hemorrhage,CT abdomen bowel work note tolerating p.o. intake, metabolic disruption. These are considered less likely due to history of present illness and physical exam findings.   This is most consistent with an acute life/limb threatening illness complicated by underlying chronic conditions.  Initial Plan:  ***  ***Screening labs including CBC and Metabolic panel to evaluate for infectious or metabolic etiology of disease.  ***Urinalysis with reflex culture ordered to evaluate for UTI or relevant urologic/nephrologic pathology.  ***CXR to evaluate for structural/infectious intrathoracic pathology.  ***EKG to evaluate for cardiac pathology. Objective evaluation as below reviewed with plan for close reassessment  Initial Study Results:   Laboratory  All laboratory results reviewed without evidence of clinically relevant pathology.   ***Exceptions include: ***   ***EKG EKG was reviewed independently. Rate, rhythm, axis, intervals all examined and without medically relevant abnormality. ST segments without concerns for elevations.    Radiology  All images reviewed independently. ***Agree with radiology report at this time.   No results found.   Consults:  Case discussed with ***.   Final Assessment and Plan:   ***   ***  Clinical Impression: No diagnosis found.   Data Unavailable   Final Clinical Impression(s) / ED Diagnoses Final diagnoses:  None    Rx / DC Orders ED Discharge Orders     None

## 2022-03-12 NOTE — ED Notes (Signed)
Patient is unable to follow directions. Patient needed assistance to get to the bathroom with the assistance of two staff members. Patient was toileted by staff and cleaned. Patient was assessed by the provider. Patient will be sent to the ED.

## 2022-03-12 NOTE — Progress Notes (Signed)
Pt husband called for update. Informed of patient status and that patient has not improved, is unable to ambulate independently or care for self.  Pt husband aware of pt transfer and EMS called by staff for pick up.  Report given to EMT/Paramedic team.

## 2022-03-12 NOTE — ED Notes (Signed)
Pt sleeping at present, no distress noted.  Monitoring for safety. 

## 2022-03-13 ENCOUNTER — Inpatient Hospital Stay (HOSPITAL_COMMUNITY): Payer: No Typology Code available for payment source

## 2022-03-13 ENCOUNTER — Other Ambulatory Visit: Payer: Self-pay

## 2022-03-13 DIAGNOSIS — I10 Essential (primary) hypertension: Secondary | ICD-10-CM | POA: Diagnosis present

## 2022-03-13 DIAGNOSIS — Z91148 Patient's other noncompliance with medication regimen for other reason: Secondary | ICD-10-CM | POA: Diagnosis not present

## 2022-03-13 DIAGNOSIS — G9341 Metabolic encephalopathy: Secondary | ICD-10-CM | POA: Diagnosis present

## 2022-03-13 DIAGNOSIS — I5031 Acute diastolic (congestive) heart failure: Secondary | ICD-10-CM

## 2022-03-13 DIAGNOSIS — Z6829 Body mass index (BMI) 29.0-29.9, adult: Secondary | ICD-10-CM | POA: Diagnosis not present

## 2022-03-13 DIAGNOSIS — Z8616 Personal history of COVID-19: Secondary | ICD-10-CM | POA: Diagnosis not present

## 2022-03-13 DIAGNOSIS — Z841 Family history of disorders of kidney and ureter: Secondary | ICD-10-CM | POA: Diagnosis not present

## 2022-03-13 DIAGNOSIS — Z801 Family history of malignant neoplasm of trachea, bronchus and lung: Secondary | ICD-10-CM | POA: Diagnosis not present

## 2022-03-13 DIAGNOSIS — F200 Paranoid schizophrenia: Secondary | ICD-10-CM | POA: Diagnosis not present

## 2022-03-13 DIAGNOSIS — Z882 Allergy status to sulfonamides status: Secondary | ICD-10-CM | POA: Diagnosis not present

## 2022-03-13 DIAGNOSIS — G928 Other toxic encephalopathy: Secondary | ICD-10-CM | POA: Diagnosis present

## 2022-03-13 DIAGNOSIS — F203 Undifferentiated schizophrenia: Secondary | ICD-10-CM | POA: Diagnosis not present

## 2022-03-13 DIAGNOSIS — F061 Catatonic disorder due to known physiological condition: Secondary | ICD-10-CM | POA: Diagnosis not present

## 2022-03-13 DIAGNOSIS — E119 Type 2 diabetes mellitus without complications: Secondary | ICD-10-CM | POA: Diagnosis present

## 2022-03-13 DIAGNOSIS — N390 Urinary tract infection, site not specified: Secondary | ICD-10-CM | POA: Diagnosis present

## 2022-03-13 DIAGNOSIS — Z833 Family history of diabetes mellitus: Secondary | ICD-10-CM | POA: Diagnosis not present

## 2022-03-13 DIAGNOSIS — N179 Acute kidney failure, unspecified: Secondary | ICD-10-CM | POA: Diagnosis present

## 2022-03-13 DIAGNOSIS — E669 Obesity, unspecified: Secondary | ICD-10-CM | POA: Diagnosis present

## 2022-03-13 DIAGNOSIS — Z888 Allergy status to other drugs, medicaments and biological substances status: Secondary | ICD-10-CM | POA: Diagnosis not present

## 2022-03-13 DIAGNOSIS — E785 Hyperlipidemia, unspecified: Secondary | ICD-10-CM | POA: Diagnosis present

## 2022-03-13 DIAGNOSIS — R4182 Altered mental status, unspecified: Secondary | ICD-10-CM | POA: Diagnosis present

## 2022-03-13 DIAGNOSIS — R45851 Suicidal ideations: Secondary | ICD-10-CM | POA: Diagnosis present

## 2022-03-13 DIAGNOSIS — Z8249 Family history of ischemic heart disease and other diseases of the circulatory system: Secondary | ICD-10-CM | POA: Diagnosis not present

## 2022-03-13 DIAGNOSIS — E860 Dehydration: Secondary | ICD-10-CM | POA: Diagnosis present

## 2022-03-13 DIAGNOSIS — Z79899 Other long term (current) drug therapy: Secondary | ICD-10-CM | POA: Diagnosis not present

## 2022-03-13 DIAGNOSIS — T424X5A Adverse effect of benzodiazepines, initial encounter: Secondary | ICD-10-CM | POA: Diagnosis present

## 2022-03-13 LAB — BASIC METABOLIC PANEL
Anion gap: 11 (ref 5–15)
BUN: 73 mg/dL — ABNORMAL HIGH (ref 8–23)
CO2: 28 mmol/L (ref 22–32)
Calcium: 9.2 mg/dL (ref 8.9–10.3)
Chloride: 106 mmol/L (ref 98–111)
Creatinine, Ser: 1.8 mg/dL — ABNORMAL HIGH (ref 0.44–1.00)
GFR, Estimated: 31 mL/min — ABNORMAL LOW (ref >60)
Glucose, Bld: 102 mg/dL — ABNORMAL HIGH (ref 70–99)
Potassium: 3.6 mmol/L (ref 3.5–5.1)
Sodium: 145 mmol/L (ref 135–145)

## 2022-03-13 LAB — CBC
HCT: 39.4 % (ref 36.0–46.0)
Hemoglobin: 12.3 g/dL (ref 12.0–15.0)
MCH: 28.7 pg (ref 26.0–34.0)
MCHC: 31.2 g/dL (ref 30.0–36.0)
MCV: 91.8 fL (ref 80.0–100.0)
Platelets: 349 10*3/uL (ref 150–400)
RBC: 4.29 MIL/uL (ref 3.87–5.11)
RDW: 13.7 % (ref 11.5–15.5)
WBC: 7.5 10*3/uL (ref 4.0–10.5)
nRBC: 0 % (ref 0.0–0.2)

## 2022-03-13 LAB — CBG MONITORING, ED
Glucose-Capillary: 105 mg/dL — ABNORMAL HIGH (ref 70–99)
Glucose-Capillary: 104 mg/dL — ABNORMAL HIGH (ref 70–99)

## 2022-03-13 LAB — BRAIN NATRIURETIC PEPTIDE: B Natriuretic Peptide: 13.1 pg/mL (ref 0.0–100.0)

## 2022-03-13 LAB — ECHOCARDIOGRAM COMPLETE
Height: 70 in
S' Lateral: 3.7 cm

## 2022-03-13 LAB — GLUCOSE, CAPILLARY: Glucose-Capillary: 83 mg/dL (ref 70–99)

## 2022-03-13 LAB — MAGNESIUM: Magnesium: 2.4 mg/dL (ref 1.7–2.4)

## 2022-03-13 MED ORDER — DILTIAZEM HCL 25 MG/5ML IV SOLN: 10.0000 mg | Freq: Four times a day (QID) | INTRAVENOUS | Status: DC | PRN

## 2022-03-13 MED ORDER — DILTIAZEM HCL 60 MG PO TABS
60.0000 mg | ORAL_TABLET | Freq: Three times a day (TID) | ORAL | Status: DC
  Administered 2022-03-13: 60 mg via ORAL
  Filled 2022-03-13 (×5): qty 1

## 2022-03-13 MED ORDER — SODIUM CHLORIDE 0.9 % IV SOLN
2.0000 g | INTRAVENOUS | Status: DC
  Administered 2022-03-13: 2 g via INTRAVENOUS
  Filled 2022-03-13: qty 20

## 2022-03-13 NOTE — ED Notes (Signed)
Amy Casey [phone]  called and was concerned if someone should sit with her mother, per chat, provider called and left a message, I attempted to speak to her but no answer.

## 2022-03-13 NOTE — Progress Notes (Signed)
  Echocardiogram 2D Echocardiogram has been performed.  Delcie Roch 03/13/2022, 4:25 PM

## 2022-03-13 NOTE — ED Notes (Signed)
Patient awake and alert, moving her upper extremities, currently thinks she is at home and asking about her children and where their room is. No s/s of distress, respirations are even and unlabored, will continue to monitor.

## 2022-03-13 NOTE — ED Notes (Signed)
Patient cleaned dried, repositioned, fresh linen and gown placed, report given to Georgiann Hahn, Charity fundraiser.

## 2022-03-13 NOTE — Consult Note (Signed)
[facility] Face-to-Face Psychiatry Consult   Reason for Consult:  psych medication management Referring Physician:  Dr. Thedore Mins Patient Identification: Amy Casey MRN:  [id number] Principal Diagnosis: Catatonia Diagnosis:  Principal Problem:   Catatonia Active Problems:   Paranoid schizophrenia (HCC)   AKI (acute kidney injury) (HCC)   Essential hypertension   Hyperlipidemia   Diabetes mellitus type 2 in obese (HCC)   Total Time spent with patient: 45 minutes  Subjective:   Amy Casey is a 61 y.o. female patient with psychiatric history of schizophrenia, presenting with altered mental status.  HPI:  Patient initially presented in [facility] with concern for possible catatonia. Per family, the patient had COVID about 5 weeks ago and has been feeling increasingly fatigued and somnolent since. She may have stopped taking her medications along the way, and she has been so somnolent that she has not been eating and drinking as well recently.  She has had episodes of catatonia in the past and this is consistent with prior episodes. At [facility], on 12/15 Ativan was started for catatonia in addition restart of her home medications (Prozac and Olanzapine). On the morning of 12/16, patient has had progressive confusion, her labwork remarkable for creatine at 1.67 and BUN at 66. Patient transferred from [facility] to [facility] ED. In ED, CT concerning for stroke, MRI negative. Patient found to have UTI and AKI, that is likely source for AMS. Patient started on antibiotic. Home psych medications were held. Psychiatry consulted to follow the patient while here.  Patient seen in ED this morning. Patient appears confused. She is delusional about the place where she is "this is not [employer]. Where is my home? When can I go home? Where is my stuff? No, I am not in [place], I live in [place], that is not [place]". She reports feeling depressed and "a little bit sleepy". She denies suicidal or homicidal ideations, plans.  She did not answer questions about hallucinations, continued to perseverate that she is not at home and wants to be at home. She is currently too confused and delusional to participate in meaningful interview.  Past Psychiatric History:  Dx: Schizophrenia Outpatient medications: Prozac 20 mg p.o. daily, olanzapine 10 mg p.o. nightly and trazodone 50 mg p.o. nightly. She receives medication management with [facility].    Past Medical History:  Past Medical History:  Diagnosis Date   Depression    History of echocardiogram 2011   [facility]   History of renal stone    Hyperlipidemia    Hypertension    Hypokalemia    Nephrolithiasis    Obesity    Schizophrenia (HCC)    Wears glasses     Past Surgical History:  Procedure Laterality Date   LUMBAR PUNCTURE  2015   WISDOM TOOTH EXTRACTION     Family History:  Family History  Problem Relation Age of Onset   Diabetes Mother    Heart disease Mother    Chronic Renal Failure Mother    Cancer Maternal Uncle        lung   Kidney disease Mother        dialysis   Stroke Neg Hx    Hypertension Neg Hx    Hyperlipidemia Neg Hx    Family Psychiatric  History:  Social History:  Social History   Substance and Sexual Activity  Alcohol Use No     Social History   Substance and Sexual Activity  Drug Use No    Social History   Socioeconomic History  Marital status: Married    Spouse name: Not on file   Number of children: 2   Years of education: 59   Highest education level: Not on file  Occupational History   Not on file  Tobacco Use   Smoking status: Never   Smokeless tobacco: Never  Vaping Use   Vaping Use: Never used  Substance and Sexual Activity   Alcohol use: No   Drug use: No   Sexual activity: Not Currently  Other Topics Concern   Not on file  Social History Narrative      Married, lives at home with 2 grandchildren, mother in law lives with them, works at [employer].   Exercise - some walking.   12/2020   Social  Determinants of Health   Financial Resource Strain: Not on file  Food Insecurity: Not on file  Transportation Needs: Not on file  Physical Activity: Not on file  Stress: Not on file  Social Connections: Not on file   Additional Social History:    Allergies:   Allergies  Allergen Reactions   Paliperidone Swelling and Other (See Comments)    Angioedema, drooling, slurred speech, ptosis   Lisinopril Swelling, Rash and Other (See Comments)    Angioedema, also    Sulfa Antibiotics Itching    Labs:  Results for orders placed or performed during the hospital encounter of 03/12/22 (from the past 48 hour(s))  CBC with Differential     Status: Abnormal   Collection Time: 03/12/22 11:28 AM  Result Value Ref Range   WBC 10.8 (H) 4.0 - 10.5 K/uL   RBC 4.92 3.87 - 5.11 MIL/uL   Hemoglobin 14.5 12.0 - 15.0 g/dL   HCT 16.1 09.6 - 04.5 %   MCV 90.9 80.0 - 100.0 fL   MCH 29.5 26.0 - 34.0 pg   MCHC 32.4 30.0 - 36.0 g/dL   RDW 40.9 81.1 - 91.4 %   Platelets 350 150 - 400 K/uL   nRBC 0.0 0.0 - 0.2 %   Neutrophils Relative % 71 %   Neutro Abs 7.8 (H) 1.7 - 7.7 K/uL   Lymphocytes Relative 15 %   Lymphs Abs 1.6 0.7 - 4.0 K/uL   Monocytes Relative 9 %   Monocytes Absolute 0.9 0.1 - 1.0 K/uL   Eosinophils Relative 4 %   Eosinophils Absolute 0.4 0.0 - 0.5 K/uL   Basophils Relative 1 %   Basophils Absolute 0.1 0.0 - 0.1 K/uL   Immature Granulocytes 0 %   Abs Immature Granulocytes 0.04 0.00 - 0.07 K/uL    Comment: Performed at [facility], [address] [place], [place] [address]  Comprehensive metabolic panel     Status: Abnormal   Collection Time: 03/12/22 11:28 AM  Result Value Ref Range   Sodium 142 135 - 145 mmol/L   Potassium 5.5 (H) 3.5 - 5.1 mmol/L    Comment: HEMOLYSIS AT THIS LEVEL MAY AFFECT RESULT   Chloride 106 98 - 111 mmol/L   CO2 24 22 - 32 mmol/L   Glucose, Bld 112 (H) 70 - 99 mg/dL    Comment: Glucose reference range applies only to samples taken after  fasting for at least 8 hours.   BUN 95 (H) 8 - 23 mg/dL   Creatinine, Ser 6.21 (H) 0.44 - 1.00 mg/dL   Calcium 9.5 8.9 - 30.8 mg/dL   Total Protein 8.3 (H) 6.5 - 8.1 g/dL   Albumin 4.3 3.5 - 5.0 g/dL   AST 40  15 - 41 U/L    Comment: HEMOLYSIS AT THIS LEVEL MAY AFFECT RESULT   ALT 26 0 - 44 U/L    Comment: HEMOLYSIS AT THIS LEVEL MAY AFFECT RESULT   Alkaline Phosphatase 83 38 - 126 U/L   Total Bilirubin 1.3 (H) 0.3 - 1.2 mg/dL    Comment: HEMOLYSIS AT THIS LEVEL MAY AFFECT RESULT   GFR, Estimated 25 (L) >60 mL/min    Comment: (NOTE) Calculated using the CKD-EPI Creatinine Equation (2021)    Anion gap 12 5 - 15    Comment: Performed at [facility], [address] [place], [place] [address]  Lipase, blood     Status: Abnormal   Collection Time: 03/12/22 11:28 AM  Result Value Ref Range   Lipase 72 (H) 11 - 51 U/L    Comment: HEMOLYSIS AT THIS LEVEL MAY AFFECT RESULT Performed at [facility], [address] [place], [place] [address]   Resp panel by RT-PCR (RSV, Flu A&B, Covid) Anterior Nasal Swab     Status: None   Collection Time: 03/12/22 11:28 AM   Specimen: Anterior Nasal Swab  Result Value Ref Range   SARS Coronavirus 2 by RT PCR NEGATIVE NEGATIVE    Comment: (NOTE) SARS-CoV-2 target nucleic acids are NOT DETECTED.  The SARS-CoV-2 RNA is generally detectable in upper respiratory specimens during the acute phase of infection. The lowest concentration of SARS-CoV-2 viral copies this assay can detect is 138 copies/mL. A negative result does not preclude SARS-Cov-2 infection and should not be used as the sole basis for treatment or other patient management decisions. A negative result may occur with  improper specimen collection/handling, submission of specimen other than nasopharyngeal swab, presence of viral mutation(s) within the areas targeted by this assay, and inadequate number of viral copies(<138 copies/mL). A negative result must be combined  with clinical observations, patient history, and epidemiological information. The expected result is Negative.  Fact Sheet for Patients:  BloggerCourse.com  Fact Sheet for Healthcare Providers:  SeriousBroker.it  This test is no t yet approved or cleared by the [place] FDA and  has been authorized for detection and/or diagnosis of SARS-CoV-2 by FDA under an Emergency Use Authorization (EUA). This EUA will remain  in effect (meaning this test can be used) for the duration of the COVID-19 declaration under Section 564(b)(1) of the Act, 21 U.S.C.section 360bbb-3(b)(1), unless the authorization is terminated  or revoked sooner.       Influenza A by PCR NEGATIVE NEGATIVE   Influenza B by PCR NEGATIVE NEGATIVE    Comment: (NOTE) The Xpert Xpress SARS-CoV-2/FLU/RSV plus assay is intended as an aid in the diagnosis of influenza from Nasopharyngeal swab specimens and should not be used as a sole basis for treatment. Nasal washings and aspirates are unacceptable for Xpert Xpress SARS-CoV-2/FLU/RSV testing.  Fact Sheet for Patients: BloggerCourse.com  Fact Sheet for Healthcare Providers: SeriousBroker.it  This test is not yet approved or cleared by the [place] FDA and has been authorized for detection and/or diagnosis of SARS-CoV-2 by FDA under an Emergency Use Authorization (EUA). This EUA will remain in effect (meaning this test can be used) for the duration of the COVID-19 declaration under Section 564(b)(1) of the Act, 21 U.S.C. section 360bbb-3(b)(1), unless the authorization is terminated or revoked.     Resp Syncytial Virus by PCR NEGATIVE NEGATIVE    Comment: (NOTE) Fact Sheet for Patients: BloggerCourse.com  Fact Sheet for Healthcare Providers: SeriousBroker.it  This test is  not yet approved or cleared by the  [place] and has been authorized for detection and/or diagnosis of SARS-CoV-2 by FDA under an Emergency Use Authorization (EUA). This EUA will remain in effect (meaning this test can be used) for the duration of the COVID-19 declaration under Section 564(b)(1) of the Act, 21 U.S.C. section 360bbb-3(b)(1), unless the authorization is terminated or revoked.  Performed at [facility], [address] [place], [place] [address]   Urinalysis, Routine w reflex microscopic Urine, Catheterized     Status: Abnormal   Collection Time: 03/12/22 12:38 PM  Result Value Ref Range   Color, Urine YELLOW YELLOW   APPearance HAZY (A) CLEAR   Specific Gravity, Urine 1.016 1.005 - 1.030   pH 5.0 5.0 - 8.0   Glucose, UA NEGATIVE NEGATIVE mg/dL   Hgb urine dipstick NEGATIVE NEGATIVE   Bilirubin Urine NEGATIVE NEGATIVE   Ketones, ur NEGATIVE NEGATIVE mg/dL   Protein, ur NEGATIVE NEGATIVE mg/dL   Nitrite NEGATIVE NEGATIVE   Leukocytes,Ua LARGE (A) NEGATIVE   RBC / HPF 0-5 0 - 5 RBC/hpf   WBC, UA >50 (H) 0 - 5 WBC/hpf   Bacteria, UA RARE (A) NONE SEEN   Squamous Epithelial / LPF 0-5 0 - 5   Hyaline Casts, UA PRESENT    Non Squamous Epithelial 0-5 (A) NONE SEEN    Comment: Performed at [facility], [address] [place], [place] [address]  Rapid urine drug screen (hospital performed)     Status: Abnormal   Collection Time: 03/12/22 12:38 PM  Result Value Ref Range   Opiates NONE DETECTED NONE DETECTED   Cocaine NONE DETECTED NONE DETECTED   Benzodiazepines POSITIVE (A) NONE DETECTED   Amphetamines NONE DETECTED NONE DETECTED   Tetrahydrocannabinol NONE DETECTED NONE DETECTED   Barbiturates NONE DETECTED NONE DETECTED    Comment: (NOTE) DRUG SCREEN FOR MEDICAL PURPOSES ONLY.  IF CONFIRMATION IS NEEDED FOR ANY PURPOSE, NOTIFY LAB WITHIN 5 DAYS.  LOWEST DETECTABLE LIMITS FOR URINE DRUG SCREEN Drug Class                     Cutoff (ng/mL) Amphetamine and metabolites     1000 Barbiturate and metabolites    200 Benzodiazepine                 200 Opiates and metabolites        300 Cocaine and metabolites        300 THC                            50 Performed at [facility], [name], [place], [place] [address]   Blood culture (routine x 2)     Status: None (Preliminary result)   Collection Time: 03/12/22  2:36 PM   Specimen: BLOOD RIGHT FOREARM  Result Value Ref Range   Specimen Description BLOOD RIGHT FOREARM    Special Requests      BOTTLES DRAWN AEROBIC AND ANAEROBIC Blood Culture adequate volume   Culture      NO GROWTH < 24 HOURS Performed at [facility], [address] [place], [place] [address]    Report Status PENDING   HIV Antibody (routine testing w rflx)     Status: None   Collection Time: 03/12/22  7:15 PM  Result Value Ref Range   HIV Screen 4th Generation wRfx Non Reactive Non Reactive  Comment: Performed at [facility], [address], [place], [place] [address]  CBG monitoring, ED     Status: Abnormal   Collection Time: 03/12/22  9:54 PM  Result Value Ref Range   Glucose-Capillary 101 (H) 70 - 99 mg/dL    Comment: Glucose reference range applies only to samples taken after fasting for at least 8 hours.  Basic metabolic panel     Status: Abnormal   Collection Time: 03/13/22  4:34 AM  Result Value Ref Range   Sodium 145 135 - 145 mmol/L   Potassium 3.6 3.5 - 5.1 mmol/L   Chloride 106 98 - 111 mmol/L   CO2 28 22 - 32 mmol/L   Glucose, Bld 102 (H) 70 - 99 mg/dL    Comment: Glucose reference range applies only to samples taken after fasting for at least 8 hours.   BUN 73 (H) 8 - 23 mg/dL   Creatinine, Ser 3.25 (H) 0.44 - 1.00 mg/dL   Calcium 9.2 8.9 - 49.8 mg/dL   GFR, Estimated 31 (L) >60 mL/min    Comment: (NOTE) Calculated using the CKD-EPI Creatinine Equation (2021)    Anion gap 11 5 - 15    Comment: Performed at [facility], [address] [place], [place] [address]  CBC     Status:  None   Collection Time: 03/13/22  4:34 AM  Result Value Ref Range   WBC 7.5 4.0 - 10.5 K/uL   RBC 4.29 3.87 - 5.11 MIL/uL   Hemoglobin 12.3 12.0 - 15.0 g/dL   HCT 83.0 94.0 - 76.8 %   MCV 91.8 80.0 - 100.0 fL   MCH 28.7 26.0 - 34.0 pg   MCHC 31.2 30.0 - 36.0 g/dL   RDW 08.8 11.0 - 31.5 %   Platelets 349 150 - 400 K/uL   nRBC 0.0 0.0 - 0.2 %    Comment: Performed at [facility], [address], [place], [place] [address]  Brain natriuretic peptide     Status: None   Collection Time: 03/13/22  4:34 AM  Result Value Ref Range   B Natriuretic Peptide 13.1 0.0 - 100.0 pg/mL    Comment: Performed at [facility], [address], [place], [place] [address]  Magnesium     Status: None   Collection Time: 03/13/22  4:34 AM  Result Value Ref Range   Magnesium 2.4 1.7 - 2.4 mg/dL    Comment: Performed at [facility], [address], [place], [place] [address]  CBG monitoring, ED     Status: Abnormal   Collection Time: 03/13/22  7:42 AM  Result Value Ref Range   Glucose-Capillary 105 (H) 70 - 99 mg/dL    Comment: Glucose reference range applies only to samples taken after fasting for at least 8 hours.    Current Facility-Administered Medications  Medication Dose Route Frequency Provider Last Rate Last Admin   acetaminophen (TYLENOL) tablet 650 mg  650 mg Oral Q6H PRN Jonah Blue, MD       Or   acetaminophen (TYLENOL) suppository 650 mg  650 mg Rectal Q6H PRN Jonah Blue, MD       bisacodyl (DULCOLAX) EC tablet 5 mg  5 mg Oral Daily PRN Jonah Blue, MD       cefTRIAXone (ROCEPHIN) 2 g in sodium chloride 0.9 % 100 mL IVPB  2 g Intravenous Q24H Leroy Sea, MD 200 mL/hr at 03/13/22 1107 2 g at 03/13/22  1107   diltiazem (CARDIZEM) injection 10 mg  10 mg Intravenous Q6H PRN Leroy Sea, MD       diltiazem (CARDIZEM) tablet 60 mg  60 mg Oral Q8H Leroy Sea, MD       enoxaparin (LOVENOX) injection 40 mg  40 mg Subcutaneous Q24H Jonah Blue,  MD   40 mg at 03/12/22 2024   hydrALAZINE (APRESOLINE) injection 5 mg  5 mg Intravenous Q4H PRN Jonah Blue, MD       insulin aspart (novoLOG) injection 0-15 Units  0-15 Units Subcutaneous TID WC Jonah Blue, MD       insulin aspart (novoLOG) injection 0-5 Units  0-5 Units Subcutaneous QHS Jonah Blue, MD       lactated ringers infusion   Intravenous Continuous Jonah Blue, MD 100 mL/hr at 03/13/22 0432 New Bag at 03/13/22 0432   ondansetron (ZOFRAN) tablet 4 mg  4 mg Oral Q6H PRN Jonah Blue, MD       Or   ondansetron [facility]) injection 4 mg  4 mg Intravenous Q6H PRN Jonah Blue, MD       polyethylene glycol (MIRALAX / GLYCOLAX) packet 17 g  17 g Oral Daily PRN Jonah Blue, MD       sodium chloride flush (NS) 0.9 % injection 3 mL  3 mL Intravenous Q12H Jonah Blue, MD   3 mL at 03/13/22 0355   Current Outpatient Medications  Medication Sig Dispense Refill   amLODipine (NORVASC) 10 MG tablet TAKE 1 TABLET BY MOUTH ONCE DAILY. APPOINTMENT REQUIRED FOR FUTURE REFILLS. (Patient taking differently: Take 10 mg by mouth in the morning.) 90 tablet 3   FLUoxetine (PROZAC) 20 MG capsule Take 1 capsule (20 mg total) by mouth daily. 90 capsule 0   OLANZapine (ZYPREXA) 10 MG tablet Take 1 tablet (10 mg total) by mouth at bedtime. 90 tablet 0   pregabalin (LYRICA) 75 MG capsule Take 1 capsule (75 mg total) by mouth 2 (two) times daily. 60 capsule 1   rosuvastatin (CRESTOR) 10 MG tablet Take 1 tablet (10 mg total) by mouth daily. 90 tablet 3   solifenacin (VESICARE) 10 MG tablet Take 1 tablet (10 mg total) by mouth daily. 30 tablet 2   traZODone (DESYREL) 50 MG tablet Take 1 tablet (50 mg total) by mouth at bedtime as needed. 90 tablet 0   triamterene-hydrochlorothiazide (DYAZIDE) 37.5-25 MG capsule Take 1 each (1 capsule total) by mouth daily. 90 capsule 3    Musculoskeletal: Strength & Muscle Tone: within normal limits Gait & Station:  n/a Patient leans:  N/A            Psychiatric Specialty Exam:  Presentation  General Appearance:  Disheveled  Eye Contact: Poor  Speech: Slow  Speech Volume: Decreased  Handedness:No data recorded  Mood and Affect  Mood: Dysphoric  Affect: Flat   Thought Process  Thought Processes: Disorganized  Descriptions of Associations:-- (see HPI)  Orientation:Partial  Thought Content:Other (comment) (see HPI)  History of Schizophrenia/Schizoaffective disorder:No data recorded Duration of Psychotic Symptoms:No data recorded Hallucinations:No data recorded Ideas of Reference:Paranoia; Delusions  Suicidal Thoughts:No data recorded Homicidal Thoughts:No data recorded  Sensorium  Memory: Immediate Poor; Recent Poor; Remote Poor  Judgment: Poor  Insight: Shallow   Executive Functions  Concentration: Poor  Attention Span: Poor  Recall: Poor  Fund of Knowledge: Poor  Language: Poor   Psychomotor Activity  Psychomotor Activity:No data recorded  Assets  Assets: Financial Resources/Insurance; Social Support   Sleep  Sleep:No  data recorded  Physical Exam: Physical Exam ROS Blood pressure 136/85, pulse 100, temperature (!) 97.4 F (36.3 C), temperature source Oral, resp. rate 14, height 5\' 10"  (1.778 m), SpO2 98 %. Body mass index is 29.7 kg/m.  Treatment Plan Summary: Daily contact with patient to assess and evaluate symptoms and progress in treatment and Medication management  Assessment/Plan: Amy Casey is a 61 y.o. female patient with psychiatric history of schizophrenia, presenting with altered mental status. Patient presents with waxing and waning confusion in a day, altered sensorium, impaired attention, disorientation and cognitive deficits that appear markedly different than her baseline, suggesting a diagnosis of delirium, likely due to AKI and UTI.  It is unclear if the patient will require an inpatient psychiatric admission due to  current delirium. Patient will be followed by Psychiatry while here and reevaluated. It is possible, that when delirium resolves, patient will be able to be restarted on medications and discharged under outpatient MH care.  I would continue holding all psychotropic medications for now, as the patient is not taking PO. When taking PO again - Olanzapine 5mg  PO QHS can be restarted for delusions.  As with all hospitalized patients, would recommend delirium precautions: Minimize use of benzodiazepines, anticholinergics, and opiates, to the extent possible, as these can worsen mentation. Utilize non-pharmacological interventions including frequent reorientation, maintaining day/night distinction (blinds closed during night, open during day), minimizing excessive stimulation, staff continuity, reorient the patient frequently, provide easily visible clock and calendar, provide sensory aids like glasses, hearing aids, encourage ambulation, regular activities and visitors to maintain cognitive stimulation.  Psychiatry will follow.  Thalia PartyAlisa Srijan Givan, MD 03/13/2022 11:56 AM

## 2022-03-13 NOTE — Progress Notes (Addendum)
PROGRESS NOTE                                                                                                                                                                                                             Patient Demographics:    Amy Casey, is a 61 y.o. female, DOB - Sep 26, 1960, [id number]  Outpatient Primary MD for the patient is Jac Canavan, PA-C    LOS - 0  Admit date - 03/12/2022    Chief Complaint  Patient presents with   Psychiatric Evaluation       Brief Narrative (HPI from H&P)     61 y.o. female with medical history significant of depression/schizophrenia; HTN; HLD; and obesity presenting with catatonia. Her adult children are present and provided the history.  The patient had COVID about 5 weeks ago and has been feeling increasingly fatigued and somnolent since.  She may have stopped taking her medications along the way, and she has been so somnolent that she has not been eating and drinking as well recently.  She has had episodes of catatonia in the past and this is consistent with prior episodes    Subjective:    Amy Casey today has, No headache, No chest pain, No abdominal pain - No Nausea, No new weakness tingling or numbness, no SOB.   Assessment  & Plan :     Catatonia -Patient with long-standing history of controlled schizophrenia with periodic bouts of catatonia, per family -In this case, she has been increasingly fatigued since COVID and likely has taken poor PO intake and been noncompliant with her medications, leading to a spiraling -There are medical issues as well as psychiatric issues to be addressed -Negative MRI -Will observe in progressive care for now -Psychiatry consulted   AKI -Normal renal function as recently as a year ago  -On 12/6, her creatinine was 1.67 and GFR 34 and today 2.19 with GFR 25 -This is likely related to lack of PO intake  -Will  provide gentle IVF hydration and follow   Possible UTI -Rocephin x 3, follow cultures   Schizophrenia -Hold psych medications for now since she is not taking PO -Home medications include fluoxetine, olanzapine, pregabalin, and trazodone -Psychiatry consulted on 03/12/22   HTN -Not taking PO so will hold amlodipine, Dyazine -Will cover with prn IV hydralazine  HLD -Not taking PO so will hold rosuvastatin   DM -Prior A1c was 6.3, not on medications -Will cover with moderate-scale SSI   Obesity -Weight loss should be encouraged -Outpatient PCP/bariatric medicine f/u encouraged         Condition - Fair  Family Communication  : called daughter Wilfred Lacy [phone]  on 03/13/2022 message left at 11:18 AM  Code Status :  Full  Consults  :  Psych  PUD Prophylaxis :    Procedures  :     MRI -  1. No acute intracranial abnormality. The right PCA/occipital appearance on CT today was Artifact. 2. But advanced signal abnormality in the bilateral cerebral white matter, deep gray nuclei, and pons - progressed since a 2015 MRI. This is nonspecific but may be related to chronic small vessel disease  TTE      Disposition Plan  :    Status is: Observation  DVT Prophylaxis  :    enoxaparin (LOVENOX) injection 40 mg Start: 03/12/22 1930    Lab Results  Component Value Date   PLT 349 03/13/2022    Diet :  Diet Order             Diet NPO time specified Except for: Sips with Meds  Diet effective now                    Inpatient Medications  Scheduled Meds:  diltiazem  60 mg Oral Q8H   enoxaparin (LOVENOX) injection  40 mg Subcutaneous Q24H   insulin aspart  0-15 Units Subcutaneous TID WC   insulin aspart  0-5 Units Subcutaneous QHS   sodium chloride flush  3 mL Intravenous Q12H   Continuous Infusions:  lactated ringers 100 mL/hr at 03/13/22 0432   PRN Meds:.acetaminophen **OR** acetaminophen, bisacodyl, diltiazem, hydrALAZINE, ondansetron **OR**  ondansetron (ZOFRAN) IV, polyethylene glycol  Antibiotics  :    Anti-infectives (From admission, onward)    Start     Dose/Rate Route Frequency Ordered Stop   03/12/22 1430  cefTRIAXone (ROCEPHIN) 2 g in sodium chloride 0.9 % 100 mL IVPB        2 g 200 mL/hr over 30 Minutes Intravenous  Once 03/12/22 1428 03/12/22 1505         Objective:   Vitals:   03/13/22 0900 03/13/22 0915 03/13/22 0930 03/13/22 0945  BP:      Pulse: (!) 110 75 (!) 112 (!) 109  Resp: 17 18 19 18   Temp:      TempSrc:      SpO2: 96% 96% 96% 97%  Height:        Wt Readings from Last 3 Encounters:  03/04/22 93.9 kg  09/07/21 102.4 kg  05/25/21 100.2 kg     Intake/Output Summary (Last 24 hours) at 03/13/2022 1024 Last data filed at 03/12/2022 1505 Gross per 24 hour  Intake 1100 ml  Output 120 ml  Net 980 ml     Physical Exam  Awake but confused, No new F.N deficits,   [place].AT,PERRAL Supple Neck, No JVD,   Symmetrical Chest wall movement, Good air movement bilaterally, CTAB RRR,No Gallops,Rubs or new Murmurs,  +ve B.Sounds, Abd Soft, No tenderness,   No Cyanosis, Clubbing or edema       Data Review:    Recent Labs  Lab 03/12/22 1128 03/13/22 0434  WBC 10.8* 7.5  HGB 14.5 12.3  HCT 44.7 39.4  PLT 350 349  MCV 90.9 91.8  MCH 29.5 28.7  MCHC  32.4 31.2  RDW 13.6 13.7  LYMPHSABS 1.6  --   MONOABS 0.9  --   EOSABS 0.4  --   BASOSABS 0.1  --     Recent Labs  Lab 03/12/22 1128 03/13/22 0434  NA 142 145  K 5.5* 3.6  CL 106 106  CO2 24 28  ANIONGAP 12 11  GLUCOSE 112* 102*  BUN 95* 73*  CREATININE 2.19* 1.80*  AST 40  --   ALT 26  --   ALKPHOS 83  --   BILITOT 1.3*  --   ALBUMIN 4.3  --   BNP  --  13.1  MG  --  2.4  CALCIUM 9.5 9.2      Radiology Reports MR BRAIN WO CONTRAST  Result Date: 03/12/2022 CLINICAL DATA:  61 year old female with altered mental status. Questionable right PCA territory edema on plain head CT today. History of schizophrenia. EXAM: MRI  HEAD WITHOUT CONTRAST TECHNIQUE: Multiplanar, multiecho pulse sequences of the brain and surrounding structures were obtained without intravenous contrast. COMPARISON:  Plain head CT 1118 hours.  Brain MRI 02/21/2014. FINDINGS: Brain: No restricted diffusion to suggest acute infarction. No midline shift, mass effect, evidence of mass lesion, ventriculomegaly, extra-axial collection or acute intracranial hemorrhage. Cervicomedullary junction and pituitary are within normal limits. No evidence of acute right occipital lobe edema. But progressed chronic widespread bilateral cerebral white matter T2 and FLAIR signal hyperintensity. And now similar signal heterogeneity in the bilateral deep gray nuclei and pons (series 5 images 8 in 14). No cortical encephalomalacia identified. Solitary chronic microhemorrhage in the left periventricular white matter on series 7, image 65. Cerebellum appears to remain normal. Vascular: Major intracranial vascular flow voids are stable since 2015. Skull and upper cervical spine: Negative for age visible cervical spine. Visualized bone marrow signal is within normal limits. Sinuses/Orbits: Negative orbits. Trace paranasal sinus mucosal thickening. Other: Mastoids are clear. Visible internal auditory structures appear normal. Negative visible scalp and face. IMPRESSION: 1. No acute intracranial abnormality. The right PCA/occipital appearance on CT today was Artifact. 2. But advanced signal abnormality in the bilateral cerebral white matter, deep gray nuclei, and pons - progressed since a 2015 MRI. This is nonspecific but may be related to chronic small vessel disease. Electronically Signed   By: Odessa FlemingH  Hall M.D.   On: 03/12/2022 14:09   CT HEAD WO CONTRAST (5MM)  Result Date: 03/12/2022 CLINICAL DATA:  61 year old female altered mental status. EXAM: CT HEAD WITHOUT CONTRAST TECHNIQUE: Contiguous axial images were obtained from the base of the skull through the vertex without intravenous  contrast. RADIATION DOSE REDUCTION: This exam was performed according to the departmental dose-optimization program which includes automated exposure control, adjustment of the mA and/or kV according to patient size and/or use of iterative reconstruction technique. COMPARISON:  Brain MRI 02/21/2014.  Head CT 07/30/2018. FINDINGS: Brain: Stable cerebral volume since 2020. No midline shift, ventriculomegaly, mass effect, evidence of mass lesion, intracranial hemorrhage. Patchy and confluent bilateral cerebral white matter hypodensity appears more pronounced in the right hemisphere and mildly progressed since 2020. Real versus artifactual confluent hypodensity in the right occipital lobe today on series 2, image 11. Legrand RamsFavor this is artifact. No other acute cortically based infarct identified. Vascular: Calcified atherosclerosis at the skull base. Skull: No acute osseous abnormality identified. Sinuses/Orbits: Visualized paranasal sinuses and mastoids are stable and well aerated. Other: No acute orbit or scalp soft tissue finding. Small chronic right frontal scalp benign lipoma. IMPRESSION: 1. Suggestion of Right PCA/occipital lobe infarct  with cytotoxic edema could be artifact. No associated hemorrhage or mass effect. Query left vision changes. Noncontrast Brain MRI would be confirmatory. 2. Otherwise advanced chronic cerebral white matter disease with some progression Electronically Signed   By: Odessa Fleming M.D.   On: 03/12/2022 11:36   DG Chest Portable 1 View  Result Date: 03/12/2022 CLINICAL DATA:  Altered mental status EXAM: PORTABLE CHEST 1 VIEW COMPARISON:  04/21/2019 FINDINGS: Transverse diameter of heart is increased. Thoracic aorta is tortuous and ectatic. Lung fields are clear of any infiltrates or pulmonary edema. There is no pleural effusion or pneumothorax. IMPRESSION: Cardiomegaly. There are no signs of pulmonary edema or focal pulmonary consolidation. Electronically Signed   By: Ernie Avena M.D.    On: 03/12/2022 11:25      Signature  -   Susa Raring M.D on 03/13/2022 at 10:24 AM   -  To page go to [email or web address]

## 2022-03-14 DIAGNOSIS — F200 Paranoid schizophrenia: Secondary | ICD-10-CM

## 2022-03-14 DIAGNOSIS — F061 Catatonic disorder due to known physiological condition: Secondary | ICD-10-CM | POA: Diagnosis not present

## 2022-03-14 LAB — BASIC METABOLIC PANEL
Anion gap: 8 (ref 5–15)
BUN: 41 mg/dL — ABNORMAL HIGH (ref 8–23)
CO2: 28 mmol/L (ref 22–32)
Calcium: 9 mg/dL (ref 8.9–10.3)
Chloride: 109 mmol/L (ref 98–111)
Creatinine, Ser: 1.34 mg/dL — ABNORMAL HIGH (ref 0.44–1.00)
GFR, Estimated: 45 mL/min — ABNORMAL LOW (ref >60)
Glucose, Bld: 167 mg/dL — ABNORMAL HIGH (ref 70–99)
Potassium: 3.3 mmol/L — ABNORMAL LOW (ref 3.5–5.1)
Sodium: 145 mmol/L (ref 135–145)

## 2022-03-14 LAB — GLUCOSE, CAPILLARY
Glucose-Capillary: 96 mg/dL (ref 70–99)
Glucose-Capillary: 102 mg/dL — ABNORMAL HIGH (ref 70–99)
Glucose-Capillary: 93 mg/dL (ref 70–99)
Glucose-Capillary: 148 mg/dL — ABNORMAL HIGH (ref 70–99)
Glucose-Capillary: 152 mg/dL — ABNORMAL HIGH (ref 70–99)

## 2022-03-14 LAB — URINE CULTURE: Culture: NO GROWTH

## 2022-03-14 LAB — MAGNESIUM: Magnesium: 2 mg/dL (ref 1.7–2.4)

## 2022-03-14 MED ORDER — METOPROLOL TARTRATE 50 MG PO TABS
50.0000 mg | ORAL_TABLET | Freq: Two times a day (BID) | ORAL | Status: DC
  Administered 2022-03-14 – 2022-03-17 (×7): 50 mg via ORAL
  Filled 2022-03-14 (×7): qty 1

## 2022-03-14 MED ORDER — ADULT MULTIVITAMIN W/MINERALS CH
1.0000 | ORAL_TABLET | Freq: Every day | ORAL | Status: DC
  Administered 2022-03-14 – 2022-03-17 (×4): 1 via ORAL
  Filled 2022-03-14 (×4): qty 1

## 2022-03-14 MED ORDER — OLANZAPINE 5 MG PO TABS
5.0000 mg | ORAL_TABLET | Freq: Every day | ORAL | Status: DC
  Administered 2022-03-14 – 2022-03-16 (×3): 5 mg via ORAL
  Filled 2022-03-14 (×3): qty 1

## 2022-03-14 MED ORDER — TRAZODONE HCL 50 MG PO TABS
50.0000 mg | ORAL_TABLET | Freq: Once | ORAL | Status: AC
  Administered 2022-03-14: 50 mg via ORAL
  Filled 2022-03-14: qty 1

## 2022-03-14 MED ORDER — ENSURE ENLIVE PO LIQD
237.0000 mL | Freq: Two times a day (BID) | ORAL | Status: DC
  Administered 2022-03-16 – 2022-03-17 (×3): 237 mL via ORAL

## 2022-03-14 MED ORDER — POTASSIUM CHLORIDE CRYS ER 20 MEQ PO TBCR
40.0000 meq | EXTENDED_RELEASE_TABLET | Freq: Once | ORAL | Status: AC
  Administered 2022-03-14: 40 meq via ORAL
  Filled 2022-03-14: qty 2

## 2022-03-14 MED ORDER — SODIUM CHLORIDE 0.9 % IV SOLN
1.0000 g | INTRAVENOUS | Status: AC
  Administered 2022-03-14 – 2022-03-15 (×2): 1 g via INTRAVENOUS
  Filled 2022-03-14 (×2): qty 10

## 2022-03-14 MED ORDER — OLANZAPINE 5 MG PO TABS: 10.0000 mg | ORAL_TABLET | Freq: Every day | ORAL | Status: DC

## 2022-03-14 NOTE — Evaluation (Signed)
Speech Language Pathology Evaluation Patient Details Name: Amy Casey MRN: [id number] DOB: 14-Mar-1961 Today's Date: 03/14/2022 Time: [id number] SLP Time Calculation (min) (ACUTE ONLY): 35 min  Problem List:  Patient Active Problem List   Diagnosis Date Noted   Acute metabolic encephalopathy 123456   Diabetes mellitus type 2 in obese ([place]) 03/12/2022   Dehydration 03/04/2022   Elevated serum creatinine 03/04/2022   Impaired fasting blood sugar 09/07/2021   Need for influenza vaccination 02/23/2021   Screening for diabetes mellitus 02/23/2021   Edema 02/23/2021   Weight gain 02/23/2021   Elevated uric acid in blood 02/23/2021   Need for pneumococcal vaccination 02/23/2021   Insomnia 02/23/2021   Screen for colon cancer 02/23/2021   Paresthesia 02/23/2021   History of recent hospitalization 01/26/2021   Cerebrovascular disease 12/31/2020   Vaccine refused by patient 12/31/2020   Hyperlipidemia 12/31/2020   Abnormal EKG 12/31/2020   Overactive bladder 07/22/2019   Vaccine counseling 07/22/2019   H/O positive serological reaction for syphilis 05/24/2019   Essential hypertension 11/06/2018   Encounter for health maintenance examination in adult 08/21/2018   Catatonia 08/16/2018   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   AKI (acute kidney injury) ([place])    High risk medication use 07/25/2016   Involuntary commitment    Paranoid schizophrenia ([place]) 06/16/2015   Noncompliance 06/16/2015   Past Medical History:  Past Medical History:  Diagnosis Date   Depression    History of echocardiogram 2011   [facility]   History of renal stone    Hyperlipidemia    Hypertension    Hypokalemia    Nephrolithiasis    Obesity    Schizophrenia ([place])    Wears glasses    Past Surgical History:  Past Surgical History:  Procedure Laterality Date   LUMBAR PUNCTURE  2015   WISDOM TOOTH EXTRACTION     HPI:  61yo female admitted 03/12/22 with AMS/Catatonia. PMH: depression,  schizophrenia, HTN, HLD, obesity. COVID+ ~5wks ago. MRI negative.   Assessment / Plan / Recommendation Clinical Impression  Pt seen at bedside for cognitive linguistic evaluation. No family present at this time. Pt was awake and alert, cooperative with testing. Initially her voice was extremely low in volume, making it difficult to understand her. When loudness increased, she was more intelligible. The Mini-Mental State Examination (MMSE) was administered today. Pt scored 18/30, indicating cognitive impairments. Pt demonstrated difficulty with spelling WORLD backward and delayed recall of unrelated words. She completed naming task (2/2 items), and repeated a phrase, but was unable to follow a 3-step verbal command. She demonstrated ability to follow a written direction, but was unable to write a sentence or copy figures. Pt also strugged with adding numbers to a clock face in the Clock Drawing task. No family present at this time to discuss pt's level of function/cognition prior to admit. That information would be helpful to identify what is baseline level of function.    SLP Assessment  SLP Recommendation/Assessment: All further Speech Language Pathology needs can be addressed in the next venue of care  SLP Visit Diagnosis: Cognitive communication deficit (R41.841)    Recommendations for follow up therapy are one component of a multi-disciplinary discharge planning process, led by the attending physician.  Recommendations may be updated based on patient status, additional functional criteria and insurance authorization.    Follow Up Recommendations  Follow physician's recommendations for discharge plan and follow up therapies    Assistance Recommended at Discharge  Frequent or constant Supervision/Assistance  Functional Status Assessment Patient  has had a recent decline in their functional status and/or demonstrates limited ability to make significant improvements in function in a reasonable and  predictable amount of time     SLP Evaluation Cognition  Overall Cognitive Status: No family/caregiver present to determine baseline cognitive functioning Arousal/Alertness: Awake/alert Orientation Level: Oriented to person;Oriented to place;Disoriented to situation;Oriented to time Year: 2023 Month: December Day of Week: Correct       Comprehension  Auditory Comprehension Overall Auditory Comprehension: Appears within functional limits for tasks assessed Reading Comprehension Reading Status: Within funtional limits    Expression Expression Primary Mode of Expression: Verbal Verbal Expression Overall Verbal Expression: Appears within functional limits for tasks assessed Written Expression Dominant Hand: Right Written Expression: Exceptions to [facility] Copy Ability:  (unable to completely copy design on MMSE) Self Formulation Ability:  (unable to write a sentence)   Oral / Motor  Oral Motor/Sensory Function Overall Oral Motor/Sensory Function: Within functional limits Motor Speech Overall Motor Speech: Appears within functional limits for tasks assessed Intelligibility: Intelligibility reduced Word: 75-100% accurate Phrase: 75-100% accurate Sentence: 75-100% accurate Conversation: 75-100% accurate Motor Planning: Within functional limits           Riaan Toledo B. Murvin Natal, [facility], CCC-SLP Speech Language Pathologist Office: [phone]  Leigh Aurora 03/14/2022, 11:28 AM

## 2022-03-14 NOTE — Progress Notes (Signed)
Initial Nutrition Assessment  DOCUMENTATION CODES:   Not applicable  INTERVENTION:   Multivitamin w/ minerals daily Ensure Enlive po BID, each supplement provides 350 kcal and 20 grams of protein. Encourage good PO intake   NUTRITION DIAGNOSIS:   Increased nutrient needs related to acute illness as evidenced by estimated needs.  GOAL:   Patient will meet greater than or equal to 90% of their needs  MONITOR:   PO intake, Supplement acceptance, Labs, I & O's  REASON FOR ASSESSMENT:   Consult  (Nutritional Goals)  ASSESSMENT:   61 y.o. female presented to the ED with catatonia. PMH includes depression, schizophrenia, HTN, HLD, and T2DM. Pt admitted with catatonia, secondary to noncompliance with medication after COVID, possible UTI, and AKI.   Pt sitting up in bed at time of RD visit, SLP just exited room prior to RD visit. Pt reports that she was eating good up until about a day prior to admission. Denies any recent weight loss, thinks that she may have gained weight recently. RD observed pt breakfast tray in room untouched, RN reports that pt has not ate anything.  Per EMR, pt has had a 14% weight loss within 6 months, this is clinically significant for time frame.   Per chart review later on, pt began to eat and take PO's after arrival of family.    Medications reviewed and include: NovoLog SSI, IV antibiotics  Labs reviewed: Potassium 3.3, BUN 41, Creatinine 1.34  NUTRITION - FOCUSED PHYSICAL EXAM:  Deferred to follow-up due to pt hesitation.   Diet Order:   Diet Order             DIET SOFT Room service appropriate? Yes with Assist; Fluid consistency: Thin  Diet effective now                   EDUCATION NEEDS:   Not appropriate for education at this time  Skin:  Skin Assessment: Reviewed RN Assessment  Last BM:  Unknown  Height:   Ht Readings from Last 1 Encounters:  03/13/22 5\' 10"  (1.778 m)    Weight:   Wt Readings from Last 1 Encounters:   03/13/22 87.8 kg    Ideal Body Weight:  68.2 kg  BMI:  Body mass index is 27.77 kg/m.  Estimated Nutritional Needs:   Kcal:  2000-2200  Protein:  100-115 grams  Fluid:  >/= 2 L    03/15/22 RD, LDN Clinical Dietitian See [facility] for contact information.

## 2022-03-14 NOTE — Consult Note (Signed)
[facility] Psychiatry New Face-to-Face Psychiatric Evaluation   Service Date: March 14, 2022 LOS:  LOS: 1 day    Assessment  Amy Casey is a 61 y.o. female admitted medically for 03/12/2022 10:42 AM for catatonia. She carries the psychiatric diagnoses of paranoid schizophrenia, poor medication noncompliance and has a past medical history of  HTN, HLD, T2DM, semi-recent COVID19 infection (5 weeks ago). Psychiatry was consulted for catatonia by Dr. Ophelia Charter.   Patient initially refusing to take p.o. and refusing all lab draws due to suspected paranoia; however, sister and son visited patient and patient began to eat, take medications by p.o., and allow for lab draws. Patient currently does not have an outpatient psychiatrist.  Recommend for outpatient psychiatry appointment to be scheduled prior to patient's discharge.  Her present encephalopathy likely secondary to the Ativan she received that was concerning for catatonia or possible UTI but urine cultures showed no growth for 2 days. Patient currently is getting medication refills from primary care doctor but she has not reestablished with an outpatient psychiatrist since [facility] discontinued seeing her as her insurance has changed.  We started Zyprexa 5 mg nightly.  She does not meet requirement for inpatient psychiatry nor IVC criteria.  Diagnoses:  Active Hospital problems: Principal Problem:   Catatonia Active Problems:   Paranoid schizophrenia (HCC)   AKI (acute kidney injury) (HCC)   Essential hypertension   Hyperlipidemia   Diabetes mellitus type 2 in obese (HCC)   Acute metabolic encephalopathy     Plan  ## Safety and Observation Level:  - Based on my clinical evaluation, I estimate the patient to be at mild risk of self harm in the current setting  ## Medications:  -- Resume Zyprexa 5 mg nightly for psychosis --Consider LAI in outpatient setting should medication compliance be a barrier to treatment of  schizophrenia --Recommend outpatient psychiatry referral to [facility] office   ## Further Work-up:  -- defer to Primary -- most recent EKG on 03/13/22 had QtC of 466 -- Pertinent labwork reviewed earlier this admission includes: Negative urine culture, K3.3, creatinine improving from 2.19>1.34  ## Disposition:  -- home if patient's psychosis does not actually worsen  ## Behavioral / Environmental:  -- delirium precautions  ##Legal Status -Patient  Thank you for this consult request. Recommendations have been communicated to the primary team.  We will continue to follow at this time.   Park Pope, MD   NEW  Relevant Aspects of Hospital Course:  Admitted on 03/12/2022 for acute encephalopathy, AKI, and concerns for catatonia.  Patient Report:  Spoke with patient, patient's sister, and patient's son. They think that while patient is not completely back to baseline, she is doing better than before she was admitted or when she has previously required psychiatric hospitalization.  Sister states that patient was worried about urinating on herself so she was refusing p.o. intake. They noted that she has not mentioned anything about "them" in reference to the group of people that she is paranoid about.  They report that she appears to have been struggling with medication compliance after COVID infection 5 weeks ago.  She stated that she had been more fatigued and refused to take her Zyprexa because it made her too sleepy during the daytime.  We discussed resuming Zyprexa given her diagnosis of paranoid schizophrenia and patient and family were agreeable to this.   Patient is alert and oriented x 4. She was able to do DOWB but took slightly longer than anticipated. She  was able to do mathematic computation appropriately.  She will mumble to herself during entirety of assessment when I was speaking with patient or patient's sister.  Unclear whether she is responding to verbal stimuli.  Patient  overall reports that she is mildly depressed and mildly anxious.  Patient does report mild paranoia if she is in a new requirement but she feels that it is not distressing to her at this time. She denies present SI/HI/AVH.  Patient was agreeable to taking Zyprexa 5 mg at night to assess if this would cause too much daytime somnolence. She does endorse last experienced AH was 3 days ago prior to going to [facility].  ROS:  Denies any symptomatic complaints at this time  Psychiatric History:  Information collected from sister.  Patient has longstanding history of paranoid schizophrenia.  Family psych history: Patient's mother also had schizophrenia   Family History:  The patient's family history includes Cancer in her maternal uncle; Chronic Renal Failure in her mother; Diabetes in her mother; Heart disease in her mother; Kidney disease in her mother.  Medical History: Past Medical History:  Diagnosis Date   Depression    History of echocardiogram 2011   [facility]   History of renal stone    Hyperlipidemia    Hypertension    Hypokalemia    Nephrolithiasis    Obesity    Schizophrenia (HCC)    Wears glasses     Surgical History: Past Surgical History:  Procedure Laterality Date   LUMBAR PUNCTURE  2015   WISDOM TOOTH EXTRACTION      Medications:   Current Facility-Administered Medications:    acetaminophen (TYLENOL) tablet 650 mg, 650 mg, Oral, Q6H PRN **OR** acetaminophen (TYLENOL) suppository 650 mg, 650 mg, Rectal, Q6H PRN, Jonah Blue, MD   bisacodyl (DULCOLAX) EC tablet 5 mg, 5 mg, Oral, Daily PRN, Jonah Blue, MD   cefTRIAXone (ROCEPHIN) 1 g in sodium chloride 0.9 % 100 mL IVPB, 1 g, Intravenous, Q24H, Leroy Sea, MD, Last Rate: 200 mL/hr at 03/14/22 1013, 1 g at 03/14/22 1013   diltiazem (CARDIZEM) injection 10 mg, 10 mg, Intravenous, Q6H PRN, Leroy Sea, MD   enoxaparin (LOVENOX) injection 40 mg, 40 mg, Subcutaneous, Q24H, Jonah Blue, MD, 40 mg at  03/13/22 2114   hydrALAZINE (APRESOLINE) injection 5 mg, 5 mg, Intravenous, Q4H PRN, Jonah Blue, MD   insulin aspart (novoLOG) injection 0-15 Units, 0-15 Units, Subcutaneous, TID WC, Jonah Blue, MD, 2 Units at 03/14/22 1644   insulin aspart (novoLOG) injection 0-5 Units, 0-5 Units, Subcutaneous, QHS, Jonah Blue, MD   lactated ringers infusion, , Intravenous, Continuous, Jonah Blue, MD, Last Rate: 100 mL/hr at 03/14/22 1011, 1,000 mL at 03/14/22 1011   metoprolol tartrate (LOPRESSOR) tablet 50 mg, 50 mg, Oral, BID, Leroy Sea, MD, 50 mg at 03/14/22 1209   ondansetron (ZOFRAN) tablet 4 mg, 4 mg, Oral, Q6H PRN **OR** ondansetron (ZOFRAN) injection 4 mg, 4 mg, Intravenous, Q6H PRN, Jonah Blue, MD   polyethylene glycol (MIRALAX / GLYCOLAX) packet 17 g, 17 g, Oral, Daily PRN, Jonah Blue, MD   sodium chloride flush (NS) 0.9 % injection 3 mL, 3 mL, Intravenous, Q12H, Jonah Blue, MD, 3 mL at 03/13/22 2200  Allergies: Allergies  Allergen Reactions   Paliperidone Swelling and Other (See Comments)    Angioedema, drooling, slurred speech, ptosis   Lisinopril Swelling, Rash and Other (See Comments)    Angioedema, also    Sulfa Antibiotics Itching  Objective  Vital signs:  Temp:  [98 F (36.7 C)-99.3 F (37.4 C)] 98 F (36.7 C) (12/18 1210) Pulse Rate:  [80-110] 80 (12/18 1210) Resp:  [17-22] 17 (12/18 1210) BP: (98-139)/(67-105) 131/80 (12/18 1210) SpO2:  [93 %-98 %] 98 % (12/18 1210) Weight:  [87.8 kg] 87.8 kg (12/17 1823)  Psychiatric Specialty Exam:  Presentation  General Appearance:  Casual; Appropriate for Environment  Eye Contact: Fair  Speech: Garbled  Speech Volume: Decreased  Handedness:No data recorded  Mood and Affect  Mood: Anxious  Affect: Flat   Thought Process  Thought Processes: Disorganized  Descriptions of Associations:Intact  Orientation:Full (Time, Place and Person)  Thought  Content:Logical  History of Schizophrenia/Schizoaffective disorder:No data recorded Duration of Psychotic Symptoms:No data recorded Hallucinations:Hallucinations: None  Ideas of Reference:None  Suicidal Thoughts:Suicidal Thoughts: No  Homicidal Thoughts:Homicidal Thoughts: No   Sensorium  Memory: Immediate Fair; Recent Fair; Remote Fair  Judgment: Impaired  Insight: Fair   Art therapist  Concentration: Poor  Attention Span: Poor  Recall: [employer] of Knowledge: Fair  Language: Fair   Psychomotor Activity  Psychomotor Activity:Psychomotor Activity: Decreased   Assets  Assets: Financial Resources/Insurance; Social Support; Intimacy   Sleep  Sleep:Sleep: Fair    Physical Exam: Physical Exam Vitals and nursing note reviewed.  Constitutional:      Appearance: Normal appearance. She is normal weight.  HENT:     Head: Normocephalic and atraumatic.  Pulmonary:     Effort: Pulmonary effort is normal.  Neurological:     General: No focal deficit present.     Mental Status: She is oriented to person, place, and time.    Review of Systems  Respiratory:  Negative for shortness of breath.   Cardiovascular:  Negative for chest pain.  Gastrointestinal:  Negative for abdominal pain, constipation, diarrhea, heartburn, nausea and vomiting.  Neurological:  Negative for headaches.   Blood pressure 131/80, pulse 80, temperature 98 F (36.7 C), temperature source Oral, resp. rate 17, height 5\' 10"  (1.778 m), weight 87.8 kg, SpO2 98 %. Body mass index is 27.77 kg/m.

## 2022-03-14 NOTE — Evaluation (Signed)
Physical Therapy Evaluation Patient Details Name: Amy Casey MRN: [id number] DOB: [birth year] Today's Date: 03/14/2022  History of Present Illness  Patient is a 61 y/o female who presents on 12/16 from [facility] with catatonia, transferred to [facility] with concern for CVAS. Found to have delirium likely due to UTI and AKI. PMH includes depression, paranoid schizophrenia, HTN, obesity.  Clinical Impression  Patient presents with generalized weakness, impaired cognition, impaired balance and impaired mobility s/p above. Pt not the most reliable historian today so info obtained from prior admission. Today, pt demonstrates poor initiation, awareness, attention and poor safety. Requires Min A for standing and transfers and increased time to perform all tasks with repetition. Pt with LOB posteriorly during transfer. Hoping when pt's mentation/cognition improves, mobility will improve as well. Will follow acutely to maximize independence and mobility prior to return home. Not safe to return home at this stage.        Recommendations for follow up therapy are one component of a multi-disciplinary discharge planning process, led by the attending physician.  Recommendations may be updated based on patient status, additional functional criteria and insurance authorization.  Follow Up Recommendations Home health PT (hoping for improvement and will not need it)      Assistance Recommended at Discharge Frequent or constant Supervision/Assistance  Patient can return home with the following  A little help with walking and/or transfers;A little help with bathing/dressing/bathroom;Assistance with cooking/housework;Direct supervision/assist for financial management;Assist for transportation;Help with stairs or ramp for entrance;Direct supervision/assist for medications management    Equipment Recommendations Other (comment) (TBA)  Recommendations for Other Services       Functional Status Assessment Patient has had a  recent decline in their functional status and demonstrates the ability to make significant improvements in function in a reasonable and predictable amount of time.     Precautions / Restrictions Precautions Precautions: Fall Restrictions Weight Bearing Restrictions: No      Mobility  Bed Mobility Overal bed mobility: Needs Assistance Bed Mobility: Supine to Sit, Sit to Supine     Supine to sit: Mod assist, +2 for safety/equipment, HOB elevated Sit to supine: Min assist   General bed mobility comments: line management safety as well as decreased initiation with LEs and trunk, poor initiation for any movement    Transfers Overall transfer level: Needs assistance Equipment used: 1 person hand held assist Transfers: Sit to/from Stand, Bed to chair/wheelchair/BSC Sit to Stand: Min assist, +2 safety/equipment Stand pivot transfers: Min assist, +2 safety/equipment         General transfer comment: posterior LOB x1 requiring therapist intervention, overall min A +2 safety for SPT bed to/from [facility]. Incontinent of urine during transfer    Ambulation/Gait                  Stairs            Wheelchair Mobility    Modified Rankin (Stroke Patients Only)       Balance Overall balance assessment: Needs assistance Sitting-balance support: Feet supported, No upper extremity supported Sitting balance-Leahy Scale: Good     Standing balance support: During functional activity Standing balance-Leahy Scale: Poor Standing balance comment: Able to stand but assist needed with pericare, LOB posteriorly when getting back to bed needing Min A                             Pertinent Vitals/Pain Pain Assessment Pain Assessment: No/denies pain    Home  Living Family/patient expects to be discharged to:: Private residence Living Arrangements: Spouse/significant other Available Help at Discharge: Family Type of Home: House Home Access: Stairs to enter Entrance  Stairs-Rails: Doctor, general practice of Steps: 5-6   Home Layout: One level Home Equipment: None Additional Comments: Pt unreliable historian, Home set up info taken from previous admission May 2020    Prior Function Prior Level of Function : Independent/Modified Independent;Patient poor historian/Family not available                     Hand Dominance   Dominant Hand: Right    Extremity/Trunk Assessment   Upper Extremity Assessment Upper Extremity Assessment: Defer to OT evaluation    Lower Extremity Assessment Lower Extremity Assessment: Generalized weakness (as noted during functional tasks)    Cervical / Trunk Assessment Cervical / Trunk Assessment: Normal  Communication   Communication: Expressive difficulties (minimal responses and sometimes hard to hear and mumbly)  Cognition Arousal/Alertness: Awake/alert Behavior During Therapy: Flat affect Overall Cognitive Status: No family/caregiver present to determine baseline cognitive functioning Area of Impairment: Orientation, Attention, Following commands, Memory, Safety/judgement, Awareness, Problem solving                 Orientation Level: Disoriented to, Place, Time, Situation Current Attention Level: Sustained Memory: Decreased short-term memory Following Commands: Follows one step commands with increased time Safety/Judgement: Decreased awareness of safety Awareness: Intellectual Problem Solving: Slow processing, Decreased initiation, Requires verbal cues General Comments: Pt generally cooperative, extra time for processing, requires max verbal encouragement for initiation of tasks, Pt did not seem to like touch from therapists, sometimes mumbling to herself. answering most questions y/n - when she did it was appropriate. no family present to confirm any home set up or baseline cog function        General Comments General comments (skin integrity, edema, etc.): VSS during session. HR up  to 117 bpm.    Exercises     Assessment/Plan    PT Assessment Patient needs continued PT services  PT Problem List Decreased strength;Decreased mobility;Decreased safety awareness;Decreased balance;Decreased activity tolerance;Decreased cognition       PT Treatment Interventions Therapeutic exercise;Gait training;Balance training;Stair training;Functional mobility training;Therapeutic activities;Patient/family education;Neuromuscular re-education;Cognitive remediation;DME instruction    PT Goals (Current goals can be found in the Care Plan section)  Acute Rehab PT Goals Patient Stated Goal: none stated, likely home PT Goal Formulation: Patient unable to participate in goal setting Time For Goal Achievement: 03/28/22 Potential to Achieve Goals: Fair    Frequency Min 3X/week     Co-evaluation   Reason for Co-Treatment: Necessary to address cognition/behavior during functional activity;For patient/therapist safety;To address functional/ADL transfers PT goals addressed during session: Mobility/safety with mobility;Balance;Strengthening/ROM OT goals addressed during session: ADL's and self-care;Strengthening/ROM       AM-PAC PT "6 Clicks" Mobility  Outcome Measure Help needed turning from your back to your side while in a flat bed without using bedrails?: A Little Help needed moving from lying on your back to sitting on the side of a flat bed without using bedrails?: A Little Help needed moving to and from a bed to a chair (including a wheelchair)?: A Little Help needed standing up from a chair using your arms (e.g., wheelchair or bedside chair)?: A Little Help needed to walk in hospital room?: Total Help needed climbing 3-5 steps with a railing? : Total 6 Click Score: 14    End of Session Equipment Utilized During Treatment: Gait belt Activity Tolerance: Other (comment) (  limited by cognition) Patient left: in bed;with call bell/phone within reach;with bed alarm set Nurse  Communication: Mobility status PT Visit Diagnosis: Muscle weakness (generalized) (M62.81);Difficulty in walking, not elsewhere classified (R26.2);Unsteadiness on feet (R26.81)    Time: 5638-7564 PT Time Calculation (min) (ACUTE ONLY): 29 min   Charges:   PT Evaluation $PT Eval Moderate Complexity: 1 Mod          Vale Haven, PT, DPT Acute Rehabilitation Services Secure chat preferred Office [phone]     Blake Divine A Raychelle Hudman 03/14/2022, 1:02 PM

## 2022-03-14 NOTE — Evaluation (Signed)
Occupational Therapy Evaluation Patient Details Name: Amy Casey MRN: [id number] DOB: Aug 07, 1960 Today's Date: 03/14/2022   History of Present Illness Patient is a 61 y/o female who presents on 12/16 from [facility] with catatonia, transferred to [facility] with concern for CVAS. Found to have delirium likely due to UTI and AKI. PMH includes depression, paranoid schizophrenia, HTN, obesity.   Clinical Impression   No family present during evaluation, but from chart review assume that Pt was independent at baseline in ADL and mobility without DME. Today Pt presents with cognitive deficits, decreased balance, strength, activity tolerance, and requires significantly more assist that assumed baseline. Pt overall is mod A for ADL - due to lack of interest in participating today more than potential ability. Transfer to [facility] was min A +2 safety and Pt did have 1 posterior LOB requiring therapist assist to correct. At this time recommending [facility] to maximize safety and independence in ADL and functional transfers. Hopeful for Pt's cognition to improve and return to PLOF. Of note: Psych following.      Recommendations for follow up therapy are one component of a multi-disciplinary discharge planning process, led by the attending physician.  Recommendations may be updated based on patient status, additional functional criteria and insurance authorization.   Follow Up Recommendations  Home health OT     Assistance Recommended at Discharge Frequent or constant Supervision/Assistance  Patient can return home with the following A little help with walking and/or transfers;A lot of help with bathing/dressing/bathroom;Assistance with cooking/housework;Direct supervision/assist for medications management;Direct supervision/assist for financial management;Assist for transportation;Help with stairs or ramp for entrance    Functional Status Assessment  Patient has had a recent decline in their functional status and  demonstrates the ability to make significant improvements in function in a reasonable and predictable amount of time.  Equipment Recommendations  BSC/3in1    Recommendations for Other Services PT consult;Speech consult     Precautions / Restrictions Precautions Precautions: Fall Restrictions Weight Bearing Restrictions: No      Mobility Bed Mobility Overal bed mobility: Needs Assistance Bed Mobility: Supine to Sit, Sit to Supine     Supine to sit: Mod assist, +2 for safety/equipment, HOB elevated (assist with BLE, trunk elevation, use of bed rail) Sit to supine: Min assist (safety with lines and trunk)   General bed mobility comments: line management safety as well as decreased initiation    Transfers Overall transfer level: Needs assistance Equipment used: 1 person hand held assist Transfers: Sit to/from Stand, Bed to chair/wheelchair/BSC Sit to Stand: Min assist, +2 safety/equipment Stand pivot transfers: Min assist, +2 safety/equipment         General transfer comment: posterior LOB x1 requiring therapist intervention, overall min A +2 safety      Balance Overall balance assessment: Mild deficits observed, not formally tested (Pt able to sit EOB and stand for peri care, posterior LOB x1 during transfers)                                         ADL either performed or assessed with clinical judgement   ADL Overall ADL's : Needs assistance/impaired   Eating/Feeding Details (indicate cue type and reason): declined to attempt drink at this time Grooming: Minimal assistance Grooming Details (indicate cue type and reason): decreased initiation Upper Body Bathing: Moderate assistance Upper Body Bathing Details (indicate cue type and reason): for back Lower Body Bathing:  Maximal assistance;Sitting/lateral leans Lower Body Bathing Details (indicate cue type and reason): knees down Upper Body Dressing : Minimal assistance Upper Body Dressing Details  (indicate cue type and reason): donning gown like robe Lower Body Dressing: Maximal assistance;Sitting/lateral leans Lower Body Dressing Details (indicate cue type and reason): to don new socks Toilet Transfer: Minimal assistance;+2 for safety/equipment;Stand-pivot;BSC/3in1   Toileting- Clothing Manipulation and Hygiene: Maximal assistance;Sitting/lateral lean Toileting - Clothing Manipulation Details (indicate cue type and reason): Pt able to maintain standing, and OT assisted with peri care     Functional mobility during ADLs: Minimal assistance;+2 for safety/equipment General ADL Comments: Pt likely able to perform more, but reluctant to participate in ADL activities, decreased access to LB, decreased initiation     Vision   Additional Comments: untested at this time     Perception     Praxis      Pertinent Vitals/Pain Pain Assessment Pain Assessment: No/denies pain     Hand Dominance Right   Extremity/Trunk Assessment Upper Extremity Assessment Upper Extremity Assessment: Overall WFL for tasks assessed (able to reach up to face, and utilized during transfers, minial engagement in ADL - but did use appropriately. not formally assessed)   Lower Extremity Assessment Lower Extremity Assessment: Defer to PT evaluation       Communication Communication Communication: Expressive difficulties (minimal responses, sometimes very mumbly and hard to hear)   Cognition Arousal/Alertness: Awake/alert Behavior During Therapy: Flat affect Overall Cognitive Status: No family/caregiver present to determine baseline cognitive functioning Area of Impairment: Orientation, Attention, Following commands, Memory, Safety/judgement, Awareness, Problem solving                 Orientation Level: Disoriented to, Place, Time, Situation Current Attention Level: Sustained Memory: Decreased short-term memory Following Commands: Follows one step commands with increased time Safety/Judgement:  Decreased awareness of safety Awareness: Intellectual Problem Solving: Slow processing, Decreased initiation, Requires verbal cues (does not like to be touched) General Comments: Pt generally cooperative, extra time for processing, requires max verbal encouragement for initiation of tasks, Pt did not seem to like touch from therapists, sometimes mumbling to herself. answering most questions y/n - when she did it was appropriate. no family present to confirm any home set up or baseline cog function     General Comments  VSS throughout session    Exercises     Shoulder Instructions      Home Living Family/patient expects to be discharged to:: Private residence Living Arrangements: Spouse/significant other Available Help at Discharge: Family Type of Home: House Home Access: Stairs to enter Secretary/administrator of Steps: 5-6 Entrance Stairs-Rails: Right;Left Home Layout: One level     Bathroom Shower/Tub: Chief Strategy Officer: Standard     Home Equipment: None   Additional Comments: Pt unreliable historian, Home set up info taken from previous admission May 2020  Lives With: Spouse;Other (Comment) (2 other adults she could not name)    Prior Functioning/Environment Prior Level of Function : Independent/Modified Independent;Patient poor historian/Family not available                        OT Problem List: Decreased activity tolerance;Decreased strength;Impaired balance (sitting and/or standing);Decreased cognition;Decreased safety awareness;Decreased knowledge of use of DME or AE;Obesity      OT Treatment/Interventions: Self-care/ADL training;Energy conservation;DME and/or AE instruction;Therapeutic activities;Cognitive remediation/compensation;Patient/family education;Balance training    OT Goals(Current goals can be found in the care plan section) Acute Rehab OT Goals Patient Stated Goal: none stated  today Time For Goal Achievement:  03/28/22 Potential to Achieve Goals: Fair ADL Goals Pt Will Perform Grooming: with modified independence;standing Pt Will Perform Upper Body Dressing: with modified independence;sitting Pt Will Perform Lower Body Dressing: with modified independence;sit to/from stand Pt Will Transfer to Toilet: with modified independence;ambulating Pt Will Perform Toileting - Clothing Manipulation and hygiene: with modified independence;sit to/from stand  OT Frequency: Min 2X/week    Co-evaluation PT/OT/SLP Co-Evaluation/Treatment: Yes Reason for Co-Treatment: Necessary to address cognition/behavior during functional activity;For patient/therapist safety;To address functional/ADL transfers PT goals addressed during session: Mobility/safety with mobility;Balance;Strengthening/ROM OT goals addressed during session: ADL's and self-care;Strengthening/ROM      AM-PAC OT "6 Clicks" Daily Activity     Outcome Measure Help from another person eating meals?: A Lot Help from another person taking care of personal grooming?: A Little Help from another person toileting, which includes using toliet, bedpan, or urinal?: A Lot Help from another person bathing (including washing, rinsing, drying)?: A Lot Help from another person to put on and taking off regular upper body clothing?: A Little Help from another person to put on and taking off regular lower body clothing?: A Lot 6 Click Score: 14   End of Session Equipment Utilized During Treatment: Gait belt Nurse Communication: Mobility status;Precautions  Activity Tolerance: Patient tolerated treatment well (very flat throughout) Patient left: in bed;with call bell/phone within reach;with bed alarm set  OT Visit Diagnosis: Unsteadiness on feet (R26.81);Other abnormalities of gait and mobility (R26.89);Muscle weakness (generalized) (M62.81);Other symptoms and signs involving cognitive function                Time: 1007-1035 OT Time Calculation (min): 28  min Charges:  OT General Charges $OT Visit: 1 Visit OT Evaluation $OT Eval Moderate Complexity: [place] OTR/L Acute Rehabilitation Services Office: [place] 03/14/2022, 12:50 PM

## 2022-03-14 NOTE — Progress Notes (Signed)
Patient refusing PO medications (HS and AM cardizem) and AM labs.  MD aware.  No new orders at this time.  Will re-time AM cardizem and advise day shift to re-attempt with family present.

## 2022-03-14 NOTE — Progress Notes (Addendum)
PROGRESS NOTE                                                                                                                                                                                                             Patient Demographics:    Amy Casey, is a 61 y.o. female, DOB - 02/02/1961, ZOX:[id number]  Outpatient Primary MD for the patient is Tysinger, Cleda MccreedyDavid S, PA-C    LOS - 1  Admit date - 03/12/2022    Chief Complaint  Patient presents with   Psychiatric Evaluation       Brief Narrative (HPI from H&P)     61 y.o. female with medical history significant of depression/schizophrenia; HTN; HLD; and obesity presenting with catatonia. Her adult children are present and provided the history.  The patient had COVID about 5 weeks ago and has been feeling increasingly fatigued and somnolent since.  She may have stopped taking her medications along the way, and she has been so somnolent that she has not been eating and drinking as well recently.  She has had episodes of catatonia in the past and this is consistent with prior episodes    Subjective:   Patient in bed, appears comfortable, denies any headache, no fever, no chest pain or pressure, no shortness of breath , no abdominal pain. No new focal weakness.   Assessment  & Plan :    This odes of paranoia with refusal to take medications and lab draws.  Flat affect. -Patient with long-standing history of controlled schizophrenia with periodic bouts of catatonia along with paranoia, per family -Per patient's daughter she gets these episodes of paranoia once or twice a year and this is her typical behavior where she refuses to eat or drink, refuses to take medications, refuses lab draws etc. -She has had negative UA, negative MRI, was little dehydrated and has been adequately hydrated with IV fluids, will monitor her renal function. -For management of psych issues to  psychiatry.   AKI -Normal renal function as recently as a year ago  -Due to poor oral intake and dehydration hydrated with IV fluids, renal function has improved however today's lab draw could not be done due to patient's refusal.  Will monitor when she allows.   Possible UTI -Rocephin x 3, follow cultures, UA not very impressive.   Schizophrenia -Hold psych  medications for now since she is not taking PO -Home medications include fluoxetine, olanzapine, pregabalin, and trazodone -Psychiatry consulted on 03/12/22   HTN -Placed on oral Lopressor if she allows [place] to give it to her, stable TSH and echo.  Had mild sinus tachycardia due to dehydration which is improving.   HLD -Not taking PO so will hold rosuvastatin   DM -Prior A1c was 6.3, not on medications -Will cover with moderate-scale SSI  CBG (last 3)  Recent Labs    03/13/22 1755 03/14/22 0604 03/14/22 0819  GLUCAP 83 96 102*      Obesity -Weight loss should be encouraged -Outpatient PCP/bariatric medicine f/u encouraged         Condition - Fair  Family Communication  : called daughter Wilfred Lacy [phone]  on 03/13/2022 message left at 11:18 AM, updated 03/14/22  Code Status :  Full  Consults  :  Psych  PUD Prophylaxis :    Procedures  :     MRI -  1. No acute intracranial abnormality. The right PCA/occipital appearance on CT today was Artifact. 2. But advanced signal abnormality in the bilateral cerebral white matter, deep gray nuclei, and pons - progressed since a 2015 MRI. This is nonspecific but may be related to chronic small vessel disease  TTE -  1. Left ventricular ejection fraction, by estimation, is 50 to 55%. The left ventricle has low normal function. Left ventricular endocardial border not optimally defined to evaluate regional wall motion. There is mild left ventricular hypertrophy. Left ventricular diastolic parameters are consistent with Grade I diastolic dysfunction (impaired relaxation).   2. Right ventricular systolic function is normal. The right ventricular size is normal. There is normal pulmonary artery systolic pressure.  3. The mitral valve is grossly normal. No evidence of mitral valve regurgitation. No evidence of mitral stenosis.  4. The aortic valve was not well visualized. Aortic valve regurgitation is not visualized. No aortic stenosis is present.  5. The inferior vena cava is normal in size with greater than 50% respiratory variability, suggesting right atrial pressure of 3 mmHg.  6. Cannot exclude a small PFO.      Disposition Plan  :    Status is: Observation  DVT Prophylaxis  :    enoxaparin (LOVENOX) injection 40 mg Start: 03/12/22 1930    Lab Results  Component Value Date   PLT 349 03/13/2022    Diet :  Diet Order             DIET SOFT Room service appropriate? Yes with Assist; Fluid consistency: Thin  Diet effective now                    Inpatient Medications  Scheduled Meds:  diltiazem  60 mg Oral Q8H   enoxaparin (LOVENOX) injection  40 mg Subcutaneous Q24H   insulin aspart  0-15 Units Subcutaneous TID WC   insulin aspart  0-5 Units Subcutaneous QHS   sodium chloride flush  3 mL Intravenous Q12H   Continuous Infusions:  cefTRIAXone (ROCEPHIN)  IV     lactated ringers 100 mL/hr at 03/13/22 2332   PRN Meds:.acetaminophen **OR** acetaminophen, bisacodyl, diltiazem, hydrALAZINE, ondansetron **OR** ondansetron (ZOFRAN) IV, polyethylene glycol    Objective:   Vitals:   03/13/22 2112 03/14/22 0000 03/14/22 0400 03/14/22 0753  BP: (!) 116/92 98/71 117/67 127/84  Pulse: (!) 102 (!) 110 99 91  Resp: (!) 22 18 18 19   Temp: 98 F (36.7 C)  99.3 F (37.4  C) 98.4 F (36.9 C)  TempSrc: Oral  Axillary Oral  SpO2: 96% 95% 93% 95%  Weight:      Height:        Wt Readings from Last 3 Encounters:  03/13/22 87.8 kg  03/04/22 93.9 kg  09/07/21 102.4 kg     Intake/Output Summary (Last 24 hours) at 03/14/2022 0924 Last data filed  at 03/13/2022 1846 Gross per 24 hour  Intake 1572.01 ml  Output --  Net 1572.01 ml     Physical Exam  Awake but confused, No new F.N deficits, Flat affect [place].AT,PERRAL Supple Neck, No JVD,   Symmetrical Chest wall movement, Good air movement bilaterally, CTAB RRR,No Gallops, Rubs or new Murmurs,  +ve B.Sounds, Abd Soft, No tenderness,   No Cyanosis, Clubbing or edema        Data Review:    Recent Labs  Lab 03/12/22 1128 03/13/22 0434  WBC 10.8* 7.5  HGB 14.5 12.3  HCT 44.7 39.4  PLT 350 349  MCV 90.9 91.8  MCH 29.5 28.7  MCHC 32.4 31.2  RDW 13.6 13.7  LYMPHSABS 1.6  --   MONOABS 0.9  --   EOSABS 0.4  --   BASOSABS 0.1  --     Recent Labs  Lab 03/12/22 1128 03/13/22 0434  NA 142 145  K 5.5* 3.6  CL 106 106  CO2 24 28  ANIONGAP 12 11  GLUCOSE 112* 102*  BUN 95* 73*  CREATININE 2.19* 1.80*  AST 40  --   ALT 26  --   ALKPHOS 83  --   BILITOT 1.3*  --   ALBUMIN 4.3  --   BNP  --  13.1  MG  --  2.4  CALCIUM 9.5 9.2      Radiology Reports ECHOCARDIOGRAM COMPLETE  Result Date: 03/13/2022    ECHOCARDIOGRAM REPORT   Patient Name:   CAITLYNNE HARBECK Date of Exam: 03/13/2022 Medical Rec #:  [id number]       Height:       70.0 in Accession #:    [id number]      Weight:       207.0 lb Date of Birth:  Oct 01, 1960       BSA:          2.118 m Patient Age:    61 years        BP:           124/79 mmHg Patient Gender: F               HR:           108 bpm. Exam Location:  Inpatient Procedure: 2D Echo Indications:    acute diastolic chf  History:        Patient has no prior history of Echocardiogram examinations.                 Signs/Symptoms:Altered Mental Status; Risk Factors:Diabetes,                 Hypertension and Dyslipidemia.  Sonographer:    Delcie Roch RDCS Referring Phys: Heide Scales [facility]  Sonographer Comments: Technically difficult study due to poor echo windows. Image acquisition challenging due to respiratory motion. IMPRESSIONS  1. Left  ventricular ejection fraction, by estimation, is 50 to 55%. The left ventricle has low normal function. Left ventricular endocardial border not optimally defined to evaluate regional wall motion. There is mild left ventricular hypertrophy. Left ventricular diastolic parameters are consistent with  Grade I diastolic dysfunction (impaired relaxation).  2. Right ventricular systolic function is normal. The right ventricular size is normal. There is normal pulmonary artery systolic pressure.  3. The mitral valve is grossly normal. No evidence of mitral valve regurgitation. No evidence of mitral stenosis.  4. The aortic valve was not well visualized. Aortic valve regurgitation is not visualized. No aortic stenosis is present.  5. The inferior vena cava is normal in size with greater than 50% respiratory variability, suggesting right atrial pressure of 3 mmHg.  6. Cannot exclude a small PFO. Comparison(s): No prior Echocardiogram. FINDINGS  Left Ventricle: Left ventricular ejection fraction, by estimation, is 50 to 55%. The left ventricle has low normal function. Left ventricular endocardial border not optimally defined to evaluate regional wall motion. The left ventricular internal cavity  size was normal in size. There is mild left ventricular hypertrophy. Left ventricular diastolic parameters are consistent with Grade I diastolic dysfunction (impaired relaxation). Right Ventricle: The right ventricular size is normal. No increase in right ventricular wall thickness. Right ventricular systolic function is normal. There is normal pulmonary artery systolic pressure. The tricuspid regurgitant velocity is 1.93 m/s, and  with an assumed right atrial pressure of 3 mmHg, the estimated right ventricular systolic pressure is 17.9 mmHg. Left Atrium: Left atrial size was normal in size. Right Atrium: Right atrial size was normal in size. Pericardium: There is no evidence of pericardial effusion. Mitral Valve: The mitral valve is  grossly normal. No evidence of mitral valve regurgitation. No evidence of mitral valve stenosis. Tricuspid Valve: The tricuspid valve is normal in structure. Tricuspid valve regurgitation is not demonstrated. No evidence of tricuspid stenosis. Aortic Valve: The aortic valve was not well visualized. Aortic valve regurgitation is not visualized. No aortic stenosis is present. Pulmonic Valve: The pulmonic valve was not well visualized. Pulmonic valve regurgitation is not visualized. No evidence of pulmonic stenosis. Aorta: The aortic root and ascending aorta are structurally normal, with no evidence of dilitation. Venous: The inferior vena cava is normal in size with greater than 50% respiratory variability, suggesting right atrial pressure of 3 mmHg. IAS/Shunts: Cannot exclude a small PFO.  LEFT VENTRICLE PLAX 2D LVIDd:         4.80 cm     Diastology LVIDs:         3.70 cm     LV e' medial:    8.05 cm/s LV PW:         1.20 cm     LV E/e' medial:  8.3 LV IVS:        1.00 cm     LV e' lateral:   9.79 cm/s LVOT diam:     2.20 cm     LV E/e' lateral: 6.8 LV SV:         54 LV SV Index:   25 LVOT Area:     3.80 cm  LV Volumes (MOD) LV vol d, MOD A2C: 51.2 ml LV vol d, MOD A4C: 57.5 ml LV SV MOD A4C:     57.5 ml RIGHT VENTRICLE             IVC RV Basal diam:  2.60 cm     IVC diam: 1.10 cm RV S prime:     20.30 cm/s TAPSE (M-mode): 1.6 cm LEFT ATRIUM           Index        RIGHT ATRIUM           Index LA  diam:      3.30 cm 1.56 cm/m   RA Area:     14.50 cm LA Vol (A4C): 36.6 ml 17.28 ml/m  RA Volume:   35.90 ml  16.95 ml/m  AORTIC VALVE LVOT Vmax:   110.00 cm/s LVOT Vmean:  69.000 cm/s LVOT VTI:    0.142 m  AORTA Ao Root diam: 3.10 cm Ao Asc diam:  3.60 cm MV E velocity: 67.00 cm/s   TRICUSPID VALVE MV A velocity: 118.00 cm/s  TR Peak grad:   14.9 mmHg MV E/A ratio:  0.57         TR Vmax:        193.00 cm/s                              SHUNTS                             Systemic VTI:  0.14 m                              Systemic Diam: 2.20 cm Riley Lam MD Electronically signed by Riley Lam MD Signature Date/Time: 03/13/2022/6:33:05 PM    Final    MR BRAIN WO CONTRAST  Result Date: 03/12/2022 CLINICAL DATA:  61 year old female with altered mental status. Questionable right PCA territory edema on plain head CT today. History of schizophrenia. EXAM: MRI HEAD WITHOUT CONTRAST TECHNIQUE: Multiplanar, multiecho pulse sequences of the brain and surrounding structures were obtained without intravenous contrast. COMPARISON:  Plain head CT 1118 hours.  Brain MRI 02/21/2014. FINDINGS: Brain: No restricted diffusion to suggest acute infarction. No midline shift, mass effect, evidence of mass lesion, ventriculomegaly, extra-axial collection or acute intracranial hemorrhage. Cervicomedullary junction and pituitary are within normal limits. No evidence of acute right occipital lobe edema. But progressed chronic widespread bilateral cerebral white matter T2 and FLAIR signal hyperintensity. And now similar signal heterogeneity in the bilateral deep gray nuclei and pons (series 5 images 8 in 14). No cortical encephalomalacia identified. Solitary chronic microhemorrhage in the left periventricular white matter on series 7, image 65. Cerebellum appears to remain normal. Vascular: Major intracranial vascular flow voids are stable since 2015. Skull and upper cervical spine: Negative for age visible cervical spine. Visualized bone marrow signal is within normal limits. Sinuses/Orbits: Negative orbits. Trace paranasal sinus mucosal thickening. Other: Mastoids are clear. Visible internal auditory structures appear normal. Negative visible scalp and face. IMPRESSION: 1. No acute intracranial abnormality. The right PCA/occipital appearance on CT today was Artifact. 2. But advanced signal abnormality in the bilateral cerebral white matter, deep gray nuclei, and pons - progressed since a 2015 MRI. This is nonspecific but may be  related to chronic small vessel disease. Electronically Signed   By: Odessa Fleming M.D.   On: 03/12/2022 14:09   CT HEAD WO CONTRAST ( )  Result Date: 03/12/2022 CLINICAL DATA:  61 year old female altered mental status. EXAM: CT HEAD WITHOUT CONTRAST TECHNIQUE: Contiguous axial images were obtained from the base of the skull through the vertex without intravenous contrast. RADIATION DOSE REDUCTION: This exam was performed according to the departmental dose-optimization program which includes automated exposure control, adjustment of the mA and/or kV according to patient size and/or use of iterative reconstruction technique. COMPARISON:  Brain MRI 02/21/2014.  Head CT 07/30/2018. FINDINGS: Brain: Stable cerebral volume since 2020. No  midline shift, ventriculomegaly, mass effect, evidence of mass lesion, intracranial hemorrhage. Patchy and confluent bilateral cerebral white matter hypodensity appears more pronounced in the right hemisphere and mildly progressed since 2020. Real versus artifactual confluent hypodensity in the right occipital lobe today on series 2, image 11. Legrand Rams this is artifact. No other acute cortically based infarct identified. Vascular: Calcified atherosclerosis at the skull base. Skull: No acute osseous abnormality identified. Sinuses/Orbits: Visualized paranasal sinuses and mastoids are stable and well aerated. Other: No acute orbit or scalp soft tissue finding. Small chronic right frontal scalp benign lipoma. IMPRESSION: 1. Suggestion of Right PCA/occipital lobe infarct with cytotoxic edema could be artifact. No associated hemorrhage or mass effect. Query left vision changes. Noncontrast Brain MRI would be confirmatory. 2. Otherwise advanced chronic cerebral white matter disease with some progression Electronically Signed   By: Odessa Fleming M.D.   On: 03/12/2022 11:36   DG Chest Portable 1 View  Result Date: 03/12/2022 CLINICAL DATA:  Altered mental status EXAM: PORTABLE CHEST 1 VIEW  COMPARISON:  04/21/2019 FINDINGS: Transverse diameter of heart is increased. Thoracic aorta is tortuous and ectatic. Lung fields are clear of any infiltrates or pulmonary edema. There is no pleural effusion or pneumothorax. IMPRESSION: Cardiomegaly. There are no signs of pulmonary edema or focal pulmonary consolidation. Electronically Signed   By: Ernie Avena M.D.   On: 03/12/2022 11:25      Signature  -   Susa Raring M.D on 03/14/2022 at 9:24 AM   -  To page go to [email or web address]

## 2022-03-15 DIAGNOSIS — F200 Paranoid schizophrenia: Secondary | ICD-10-CM | POA: Diagnosis not present

## 2022-03-15 DIAGNOSIS — F061 Catatonic disorder due to known physiological condition: Secondary | ICD-10-CM | POA: Diagnosis not present

## 2022-03-15 LAB — BASIC METABOLIC PANEL
Anion gap: 8 (ref 5–15)
BUN: 29 mg/dL — ABNORMAL HIGH (ref 8–23)
CO2: 24 mmol/L (ref 22–32)
Calcium: 9.1 mg/dL (ref 8.9–10.3)
Chloride: 113 mmol/L — ABNORMAL HIGH (ref 98–111)
Creatinine, Ser: 1.21 mg/dL — ABNORMAL HIGH (ref 0.44–1.00)
GFR, Estimated: 50 mL/min — ABNORMAL LOW (ref >60)
Glucose, Bld: 113 mg/dL — ABNORMAL HIGH (ref 70–99)
Potassium: 3.6 mmol/L (ref 3.5–5.1)
Sodium: 145 mmol/L (ref 135–145)

## 2022-03-15 LAB — CBC WITH DIFFERENTIAL/PLATELET
Abs Immature Granulocytes: 0.02 10*3/uL (ref 0.00–0.07)
Basophils Absolute: 0 10*3/uL (ref 0.0–0.1)
Basophils Relative: 0 %
Eosinophils Absolute: 0.5 10*3/uL (ref 0.0–0.5)
Eosinophils Relative: 6 %
HCT: 37.9 % (ref 36.0–46.0)
Hemoglobin: 12.3 g/dL (ref 12.0–15.0)
Immature Granulocytes: 0 %
Lymphocytes Relative: 27 %
Lymphs Abs: 2.2 10*3/uL (ref 0.7–4.0)
MCH: 29.1 pg (ref 26.0–34.0)
MCHC: 32.5 g/dL (ref 30.0–36.0)
MCV: 89.6 fL (ref 80.0–100.0)
Monocytes Absolute: 0.7 10*3/uL (ref 0.1–1.0)
Monocytes Relative: 9 %
Neutro Abs: 4.6 10*3/uL (ref 1.7–7.7)
Neutrophils Relative %: 58 %
Platelets: 312 10*3/uL (ref 150–400)
RBC: 4.23 MIL/uL (ref 3.87–5.11)
RDW: 13.3 % (ref 11.5–15.5)
WBC: 8.1 10*3/uL (ref 4.0–10.5)
nRBC: 0 % (ref 0.0–0.2)

## 2022-03-15 LAB — GLUCOSE, CAPILLARY
Glucose-Capillary: 112 mg/dL — ABNORMAL HIGH (ref 70–99)
Glucose-Capillary: 106 mg/dL — ABNORMAL HIGH (ref 70–99)
Glucose-Capillary: 194 mg/dL — ABNORMAL HIGH (ref 70–99)

## 2022-03-15 LAB — BRAIN NATRIURETIC PEPTIDE: B Natriuretic Peptide: 70 pg/mL (ref 0.0–100.0)

## 2022-03-15 LAB — MAGNESIUM: Magnesium: 1.8 mg/dL (ref 1.7–2.4)

## 2022-03-15 NOTE — Progress Notes (Signed)
PROGRESS NOTE                                                                                                                                                                                                             Patient Demographics:    Amy Casey, is a 61 y.o. female, DOB - 12-19-1960, ZOX:[id number]  Outpatient Primary MD for the patient is Tysinger, Cleda Mccreedy    LOS - 2  Admit date - 03/12/2022    Chief Complaint  Patient presents with   Psychiatric Evaluation       Brief Narrative (HPI from H&P)     61 y.o. female with medical history significant of depression/schizophrenia; HTN; HLD; and obesity presenting with catatonia. Her adult children are present and provided the history.  The patient had COVID about 5 weeks ago and has been feeling increasingly fatigued and somnolent since.  She may have stopped taking her medications along the way, and she has been so somnolent that she has not been eating and drinking as well recently.  She has had episodes of catatonia in the past and this is consistent with prior episodes    Subjective:   Patient in bed, appears comfortable, denies any headache, no fever, no chest pain or pressure, no shortness of breath , no abdominal pain. No new focal weakness.   Assessment  & Plan :    This odes of paranoia with refusal to take medications and lab draws.  Flat affect. -Patient with long-standing history of controlled schizophrenia with periodic bouts of catatonia along with paranoia, per family -Per patient's daughter she gets these episodes of paranoia once or twice a year and this is her typical behavior where she refuses to eat or drink, refuses to take medications, refuses lab draws etc. -She has had negative UA, negative MRI, was little dehydrated and has been adequately hydrated with IV fluids,stable now.. -For management of psych issues to psychiatry.  Medically  cleared for disposition on 03/15/2022.   AKI -Normal renal function as recently as a year ago  -Due to poor oral intake and dehydration hydrated with IV fluids, resolved.   Possible UTI -Rocephin x 3, follow cultures, UA not very impressive.   Schizophrenia -Hold psych medications for now since she is not taking PO -Home medications include fluoxetine, olanzapine, pregabalin, and trazodone -  Psychiatry consulted, inpt Psych ? Medically cleared for disposition on 03/15/2022.   HTN -Placed on oral Lopressor if she allows [place] to give it to her, stable TSH and echo.  Had mild sinus tachycardia due to dehydration which is improving.   HLD -Not taking PO so will hold rosuvastatin   DM -Prior A1c was 6.3, not on medications -Will cover with moderate-scale SSI  CBG (last 3)  Recent Labs    03/14/22 1640 03/14/22 2117 03/15/22 0905  GLUCAP 148* 152* 112*      Obesity -Weight loss should be encouraged -Outpatient PCP/bariatric medicine f/u encouraged         Condition - Fair  Family Communication  : called daughter Wilfred Lacy [phone]  on 03/13/2022 message left at 11:18 AM, updated 03/14/22  Code Status :  Full  Consults  :  Psych  PUD Prophylaxis :    Procedures  :     MRI -  1. No acute intracranial abnormality. The right PCA/occipital appearance on CT today was Artifact. 2. But advanced signal abnormality in the bilateral cerebral white matter, deep gray nuclei, and pons - progressed since a 2015 MRI. This is nonspecific but may be related to chronic small vessel disease  TTE -  1. Left ventricular ejection fraction, by estimation, is 50 to 55%. The left ventricle has low normal function. Left ventricular endocardial border not optimally defined to evaluate regional wall motion. There is mild left ventricular hypertrophy. Left ventricular diastolic parameters are consistent with Grade I diastolic dysfunction (impaired relaxation).  2. Right ventricular systolic  function is normal. The right ventricular size is normal. There is normal pulmonary artery systolic pressure.  3. The mitral valve is grossly normal. No evidence of mitral valve regurgitation. No evidence of mitral stenosis.  4. The aortic valve was not well visualized. Aortic valve regurgitation is not visualized. No aortic stenosis is present.  5. The inferior vena cava is normal in size with greater than 50% respiratory variability, suggesting right atrial pressure of 3 mmHg.  6. Cannot exclude a small PFO.      Disposition Plan  :    Status is: Observation  DVT Prophylaxis  :    enoxaparin (LOVENOX) injection 40 mg Start: 03/12/22 1930    Lab Results  Component Value Date   PLT 312 03/15/2022    Diet :  Diet Order             DIET SOFT Room service appropriate? Yes with Assist; Fluid consistency: Thin  Diet effective now                    Inpatient Medications  Scheduled Meds:  enoxaparin (LOVENOX) injection  40 mg Subcutaneous Q24H   feeding supplement  237 mL Oral BID BM   insulin aspart  0-15 Units Subcutaneous TID WC   insulin aspart  0-5 Units Subcutaneous QHS   metoprolol tartrate  50 mg Oral BID   multivitamin with minerals  1 tablet Oral Daily   OLANZapine  5 mg Oral QHS   sodium chloride flush  3 mL Intravenous Q12H   Continuous Infusions:  cefTRIAXone (ROCEPHIN)  IV 1 g (03/15/22 1215)   PRN Meds:.acetaminophen **OR** acetaminophen, bisacodyl, diltiazem, hydrALAZINE, ondansetron **OR** ondansetron (ZOFRAN) IV, polyethylene glycol    Objective:   Vitals:   03/14/22 1200 03/14/22 1210 03/14/22 2049 03/15/22 0331  BP: (!) 135/105 131/80 120/63 (!) 132/90  Pulse: 88 80 88 95  Resp: (!) 22 17 16  15  Temp:  98 F (36.7 C) 98.1 F (36.7 C) 98 F (36.7 C)  TempSrc:  Oral Oral Oral  SpO2: 97% 98% 96%   Weight:      Height:        Wt Readings from Last 3 Encounters:  03/13/22 87.8 kg  03/04/22 93.9 kg  09/07/21 102.4 kg    No intake or  output data in the 24 hours ending 03/15/22 1237    Physical Exam  Awake, No new F.N deficits, flat affect [place].AT,PERRAL Supple Neck, No JVD,   Symmetrical Chest wall movement, Good air movement bilaterally, CTAB RRR,No Gallops, Rubs or new Murmurs,  +ve B.Sounds, Abd Soft, No tenderness,   No Cyanosis, Clubbing or edema      Data Review:    Recent Labs  Lab 03/12/22 1128 03/13/22 0434 03/15/22 0514  WBC 10.8* 7.5 8.1  HGB 14.5 12.3 12.3  HCT 44.7 39.4 37.9  PLT 350 349 312  MCV 90.9 91.8 89.6  MCH 29.5 28.7 29.1  MCHC 32.4 31.2 32.5  RDW 13.6 13.7 13.3  LYMPHSABS 1.6  --  2.2  MONOABS 0.9  --  0.7  EOSABS 0.4  --  0.5  BASOSABS 0.1  --  0.0    Recent Labs  Lab 03/12/22 1128 03/13/22 0434 03/14/22 1417 03/15/22 0514  NA 142 145 145 145  K 5.5* 3.6 3.3* 3.6  CL 106 106 109 113*  CO2 24 28 28 24   ANIONGAP 12 11 8 8   GLUCOSE 112* 102* 167* 113*  BUN 95* 73* 41* 29*  CREATININE 2.19* 1.80* 1.34* 1.21*  AST 40  --   --   --   ALT 26  --   --   --   ALKPHOS 83  --   --   --   BILITOT 1.3*  --   --   --   ALBUMIN 4.3  --   --   --   BNP  --  13.1  --  70.0  MG  --  2.4 2.0 1.8  CALCIUM 9.5 9.2 9.0 9.1      Radiology Reports ECHOCARDIOGRAM COMPLETE  Result Date: 03/13/2022    ECHOCARDIOGRAM REPORT   Patient Name:   AUDREE SCHRECENGOST Date of Exam: 03/13/2022 Medical Rec #:  [name]       Height:       70.0 in Accession #:    03/15/2022      Weight:       207.0 lb Date of Birth:  09-Nov-1960       BSA:          2.118 m Patient Age:    61 years        BP:           124/79 mmHg Patient Gender: F               HR:           108 bpm. Exam Location:  Inpatient Procedure: 2D Echo Indications:    acute diastolic chf  History:        Patient has no prior history of Echocardiogram examinations.                 Signs/Symptoms:Altered Mental Status; Risk Factors:Diabetes,                 Hypertension and Dyslipidemia.  Sonographer:    [id number] RDCS Referring Phys:  03/04/1959 Delcie Roch [facility]  Sonographer Comments:  Technically difficult study due to poor echo windows. Image acquisition challenging due to respiratory motion. IMPRESSIONS  1. Left ventricular ejection fraction, by estimation, is 50 to 55%. The left ventricle has low normal function. Left ventricular endocardial border not optimally defined to evaluate regional wall motion. There is mild left ventricular hypertrophy. Left ventricular diastolic parameters are consistent with Grade I diastolic dysfunction (impaired relaxation).  2. Right ventricular systolic function is normal. The right ventricular size is normal. There is normal pulmonary artery systolic pressure.  3. The mitral valve is grossly normal. No evidence of mitral valve regurgitation. No evidence of mitral stenosis.  4. The aortic valve was not well visualized. Aortic valve regurgitation is not visualized. No aortic stenosis is present.  5. The inferior vena cava is normal in size with greater than 50% respiratory variability, suggesting right atrial pressure of 3 mmHg.  6. Cannot exclude a small PFO. Comparison(s): No prior Echocardiogram. FINDINGS  Left Ventricle: Left ventricular ejection fraction, by estimation, is 50 to 55%. The left ventricle has low normal function. Left ventricular endocardial border not optimally defined to evaluate regional wall motion. The left ventricular internal cavity  size was normal in size. There is mild left ventricular hypertrophy. Left ventricular diastolic parameters are consistent with Grade I diastolic dysfunction (impaired relaxation). Right Ventricle: The right ventricular size is normal. No increase in right ventricular wall thickness. Right ventricular systolic function is normal. There is normal pulmonary artery systolic pressure. The tricuspid regurgitant velocity is 1.93 m/s, and  with an assumed right atrial pressure of 3 mmHg, the estimated right ventricular systolic pressure is 17.9 mmHg. Left Atrium: Left  atrial size was normal in size. Right Atrium: Right atrial size was normal in size. Pericardium: There is no evidence of pericardial effusion. Mitral Valve: The mitral valve is grossly normal. No evidence of mitral valve regurgitation. No evidence of mitral valve stenosis. Tricuspid Valve: The tricuspid valve is normal in structure. Tricuspid valve regurgitation is not demonstrated. No evidence of tricuspid stenosis. Aortic Valve: The aortic valve was not well visualized. Aortic valve regurgitation is not visualized. No aortic stenosis is present. Pulmonic Valve: The pulmonic valve was not well visualized. Pulmonic valve regurgitation is not visualized. No evidence of pulmonic stenosis. Aorta: The aortic root and ascending aorta are structurally normal, with no evidence of dilitation. Venous: The inferior vena cava is normal in size with greater than 50% respiratory variability, suggesting right atrial pressure of 3 mmHg. IAS/Shunts: Cannot exclude a small PFO.  LEFT VENTRICLE PLAX 2D LVIDd:         4.80 cm     Diastology LVIDs:         3.70 cm     LV e' medial:    8.05 cm/s LV PW:         1.20 cm     LV E/e' medial:  8.3 LV IVS:        1.00 cm     LV e' lateral:   9.79 cm/s LVOT diam:     2.20 cm     LV E/e' lateral: 6.8 LV SV:         54 LV SV Index:   25 LVOT Area:     3.80 cm  LV Volumes (MOD) LV vol d, MOD A2C: 51.2 ml LV vol d, MOD A4C: 57.5 ml LV SV MOD A4C:     57.5 ml RIGHT VENTRICLE             IVC RV  Basal diam:  2.60 cm     IVC diam: 1.10 cm RV S prime:     20.30 cm/s TAPSE (M-mode): 1.6 cm LEFT ATRIUM           Index        RIGHT ATRIUM           Index LA diam:      3.30 cm 1.56 cm/m   RA Area:     14.50 cm LA Vol (A4C): 36.6 ml 17.28 ml/m  RA Volume:   35.90 ml  16.95 ml/m  AORTIC VALVE LVOT Vmax:   110.00 cm/s LVOT Vmean:  69.000 cm/s LVOT VTI:    0.142 m  AORTA Ao Root diam: 3.10 cm Ao Asc diam:  3.60 cm MV E velocity: 67.00 cm/s   TRICUSPID VALVE MV A velocity: 118.00 cm/s  TR Peak grad:   14.9  mmHg MV E/A ratio:  0.57         TR Vmax:        193.00 cm/s                              SHUNTS                             Systemic VTI:  0.14 m                             Systemic Diam: 2.20 cm Riley LamMahesh Chandrasekhar MD Electronically signed by Riley LamMahesh Chandrasekhar MD Signature Date/Time: 03/13/2022/6:33:05 PM    Final    MR BRAIN WO CONTRAST  Result Date: 03/12/2022 CLINICAL DATA:  61 year old female with altered mental status. Questionable right PCA territory edema on plain head CT today. History of schizophrenia. EXAM: MRI HEAD WITHOUT CONTRAST TECHNIQUE: Multiplanar, multiecho pulse sequences of the brain and surrounding structures were obtained without intravenous contrast. COMPARISON:  Plain head CT 1118 hours.  Brain MRI 02/21/2014. FINDINGS: Brain: No restricted diffusion to suggest acute infarction. No midline shift, mass effect, evidence of mass lesion, ventriculomegaly, extra-axial collection or acute intracranial hemorrhage. Cervicomedullary junction and pituitary are within normal limits. No evidence of acute right occipital lobe edema. But progressed chronic widespread bilateral cerebral white matter T2 and FLAIR signal hyperintensity. And now similar signal heterogeneity in the bilateral deep gray nuclei and pons (series 5 images 8 in 14). No cortical encephalomalacia identified. Solitary chronic microhemorrhage in the left periventricular white matter on series 7, image 65. Cerebellum appears to remain normal. Vascular: Major intracranial vascular flow voids are stable since 2015. Skull and upper cervical spine: Negative for age visible cervical spine. Visualized bone marrow signal is within normal limits. Sinuses/Orbits: Negative orbits. Trace paranasal sinus mucosal thickening. Other: Mastoids are clear. Visible internal auditory structures appear normal. Negative visible scalp and face. IMPRESSION: 1. No acute intracranial abnormality. The right PCA/occipital appearance on CT today was  Artifact. 2. But advanced signal abnormality in the bilateral cerebral white matter, deep gray nuclei, and pons - progressed since a 2015 MRI. This is nonspecific but may be related to chronic small vessel disease. Electronically Signed   By: Odessa FlemingH  Hall M.D.   On: 03/12/2022 14:09   CT HEAD WO CONTRAST (5MM)  Result Date: 03/12/2022 CLINICAL DATA:  61 year old female altered mental status. EXAM: CT HEAD WITHOUT CONTRAST TECHNIQUE: Contiguous axial images were obtained from the base  of the skull through the vertex without intravenous contrast. RADIATION DOSE REDUCTION: This exam was performed according to the departmental dose-optimization program which includes automated exposure control, adjustment of the mA and/or kV according to patient size and/or use of iterative reconstruction technique. COMPARISON:  Brain MRI 02/21/2014.  Head CT 07/30/2018. FINDINGS: Brain: Stable cerebral volume since 2020. No midline shift, ventriculomegaly, mass effect, evidence of mass lesion, intracranial hemorrhage. Patchy and confluent bilateral cerebral white matter hypodensity appears more pronounced in the right hemisphere and mildly progressed since 2020. Real versus artifactual confluent hypodensity in the right occipital lobe today on series 2, image 11. Legrand Rams this is artifact. No other acute cortically based infarct identified. Vascular: Calcified atherosclerosis at the skull base. Skull: No acute osseous abnormality identified. Sinuses/Orbits: Visualized paranasal sinuses and mastoids are stable and well aerated. Other: No acute orbit or scalp soft tissue finding. Small chronic right frontal scalp benign lipoma. IMPRESSION: 1. Suggestion of Right PCA/occipital lobe infarct with cytotoxic edema could be artifact. No associated hemorrhage or mass effect. Query left vision changes. Noncontrast Brain MRI would be confirmatory. 2. Otherwise advanced chronic cerebral white matter disease with some progression Electronically Signed    By: Odessa Fleming M.D.   On: 03/12/2022 11:36   DG Chest Portable 1 View  Result Date: 03/12/2022 CLINICAL DATA:  Altered mental status EXAM: PORTABLE CHEST 1 VIEW COMPARISON:  04/21/2019 FINDINGS: Transverse diameter of heart is increased. Thoracic aorta is tortuous and ectatic. Lung fields are clear of any infiltrates or pulmonary edema. There is no pleural effusion or pneumothorax. IMPRESSION: Cardiomegaly. There are no signs of pulmonary edema or focal pulmonary consolidation. Electronically Signed   By: Ernie Avena M.D.   On: 03/12/2022 11:25      Signature  -   Susa Raring M.D on 03/15/2022 at 12:37 PM   -  To page go to [email or web address]

## 2022-03-15 NOTE — Progress Notes (Signed)
CSW received request to complete IVC for patient as she will be recommended for inpatient psych placement and does not have capacity to sign voluntarily. IVC completed and signed by psych MD; faxed to magistrate. [employer] contacted to serve patient. Paperwork placed on the hard chart.   Joaquin Courts, MSW, [facility]

## 2022-03-15 NOTE — Progress Notes (Signed)
report given to oncoming shift RN Ivana 

## 2022-03-15 NOTE — Discharge Instructions (Signed)
Follow with Primary MD Tysinger, Kermit Balo, PA-C and your psychiatrist in 7 days   Get CBC, CMP, 2 view Chest X ray -  checked next visit with your primary MD    Activity: As tolerated with Full fall precautions use walker/cane & assistance as needed  Disposition Home    Diet: Heart Healthy   Special Instructions: If you have smoked or chewed Tobacco  in the last 2 yrs please stop smoking, stop any regular Alcohol  and or any Recreational drug use.  On your next visit with your primary care physician please Get Medicines reviewed and adjusted.  Please request your Prim.MD to go over all Hospital Tests and Procedure/Radiological results at the follow up, please get all Hospital records sent to your Prim MD by signing hospital release before you go home.  If you experience worsening of your admission symptoms, develop shortness of breath, life threatening emergency, suicidal or homicidal thoughts you must seek medical attention immediately by calling 911 or calling your MD immediately  if symptoms less severe.  You Must read complete instructions/literature along with all the possible adverse reactions/side effects for all the Medicines you take and that have been prescribed to you. Take any new Medicines after you have completely understood and accpet all the possible adverse reactions/side effects.

## 2022-03-15 NOTE — Consult Note (Signed)
[facility] New Face-to-Face Psychiatric Evaluation   Service Date: March 15, 2022 LOS:  LOS: 2 days    Assessment  Amy Casey is a 61 y.o. female admitted medically for 03/12/2022 10:42 AM for catatonia. She carries the psychiatric diagnoses of paranoid schizophrenia, poor medication noncompliance and has a past medical history of  HTN, HLD, T2DM, semi-recent COVID19 infection (5 weeks ago). Psychiatry was consulted for catatonia by Dr. Ophelia Charter. Admitted on 12/16 from [facility] for similar presentation.   Patient initially refusing to take p.o. and refusing all lab draws due to suspected paranoia; however, sister and son visited patient and patient began to eat, take medications by p.o., and allow for lab draws. Patient currently does not have an outpatient psychiatrist. Her present encephalopathy likely secondary to the Ativan she received that was concerning for catatonia or possible UTI but urine cultures showed no growth for 2 days.  Although she briefly improved on 12/18, by 12/19 she was muttering throughout interview, had general paranoia directed toward the rest of the world, and had stopped eating again (even with her son's encouragement) which is a frequent early sign of decompensation for her. She did fairly well on attention and orientation testing, and delirium is now mostly resolved; she has been medically clear by primary Dr. Thedore Mins. At this point, Patient currently is getting medication refills from primary care doctor but she has not reestablished with an outpatient psychiatrist since [facility] discontinued seeing her as her insurance has changed.    At baseline, she is fully independent, works, and drives. Feel appropriate for adult unit and does not require geripsych.   Diagnoses:  Active Hospital problems: Principal Problem:   Catatonia Active Problems:   Paranoid schizophrenia (HCC)   AKI (acute kidney injury) (HCC)   Essential hypertension    Hyperlipidemia   Diabetes mellitus type 2 in obese (HCC)   Acute metabolic encephalopathy    Plan  ## Safety and Observation Level:  - Based on my clinical evaluation, I estimate the patient to be at mild risk of self harm in the current setting  ## Medications:  -- Resume Zyprexa 5 mg nightly for psychosis --Consider LAI in outpatient setting should medication compliance be a barrier to treatment of schizophrenia - might need to be abilify for this (hx catatonia)  --Recommend outpatient psychiatry referral to [facility] office when discharged   ## Further Work-up:  -- defer to Primary -- most recent EKG on 03/13/22 had QtC of 466 -- Pertinent labwork reviewed earlier this admission includes: Negative urine culture, K3.3, creatinine improving from 2.19>1.34->1.21  ## Disposition:  -- inpt psych  ## Behavioral / Environmental:  -- delirium precautions  ##Legal Status -Patient  Thank you for this consult request. Recommendations have been communicated to the primary team.  We will continue to follow at this time.   Amy Casey   NEW  Relevant Aspects of Hospital Course:  Admitted on 03/12/2022 for acute encephalopathy, AKI, and concerns for catatonia.  Patient Report:  Saw pt x2 today. On first eval, was alert and oriented x4 and able to do DOWB with 1 error (but faster than reported yesterday). Muttered to self throughout majority of eval, but denied SI, HI, and AH/VH. On second eval after lunch, pt had not eaten any of her lunch and had frankly paranoid concerns about her food, talking incoherently about a judge, etc. D/w sister at bedside pt did not clear up (as hoped). I tried to talk about voluntary admission with  pt, but it became clear she did not have capacity to fill out voluntary form. Called sister per request (could not make it back ot bedside) around 8 PM and stated pt had improved a little and eaten some, but not back to baseline.    ROS:  Denies any  symptomatic complaints at this time  Psychiatric History:  Information collected from sister.  Patient has longstanding history of paranoid schizophrenia.  Family psych history: Patient's mother also had schizophrenia   Family History:  The patient's family history includes Cancer in her maternal uncle; Chronic Renal Failure in her mother; Diabetes in her mother; Heart disease in her mother; Kidney disease in her mother.  Medical History: Past Medical History:  Diagnosis Date   Depression    History of echocardiogram 2011   [facility]   History of renal stone    Hyperlipidemia    Hypertension    Hypokalemia    Nephrolithiasis    Obesity    Schizophrenia (HCC)    Wears glasses     Surgical History: Past Surgical History:  Procedure Laterality Date   LUMBAR PUNCTURE  2015   WISDOM TOOTH EXTRACTION      Medications:   Current Facility-Administered Medications:    acetaminophen (TYLENOL) tablet 650 mg, 650 mg, Oral, Q6H PRN **OR** acetaminophen (TYLENOL) suppository 650 mg, 650 mg, Rectal, Q6H PRN, Jonah Blue, MD   bisacodyl (DULCOLAX) EC tablet 5 mg, 5 mg, Oral, Daily PRN, Jonah Blue, MD   diltiazem (CARDIZEM) injection 10 mg, 10 mg, Intravenous, Q6H PRN, Leroy Sea, MD   enoxaparin (LOVENOX) injection 40 mg, 40 mg, Subcutaneous, Q24H, Jonah Blue, MD, 40 mg at 03/14/22 1958   feeding supplement (ENSURE ENLIVE / ENSURE PLUS) liquid 237 mL, 237 mL, Oral, BID BM, Leroy Sea, MD   hydrALAZINE (APRESOLINE) injection 5 mg, 5 mg, Intravenous, Q4H PRN, Jonah Blue, MD   insulin aspart (novoLOG) injection 0-15 Units, 0-15 Units, Subcutaneous, TID WC, Jonah Blue, MD, 2 Units at 03/14/22 1644   insulin aspart (novoLOG) injection 0-5 Units, 0-5 Units, Subcutaneous, QHS, Jonah Blue, MD   metoprolol tartrate (LOPRESSOR) tablet 50 mg, 50 mg, Oral, BID, Leroy Sea, MD, 50 mg at 03/15/22 1004   multivitamin with minerals tablet 1 tablet, 1 tablet,  Oral, Daily, Leroy Sea, MD, 1 tablet at 03/15/22 1004   OLANZapine (ZYPREXA) tablet 5 mg, 5 mg, Oral, QHS, Crosley, Debby, MD, 5 mg at 03/14/22 1950   ondansetron (ZOFRAN) tablet 4 mg, 4 mg, Oral, Q6H PRN **OR** ondansetron (ZOFRAN) injection 4 mg, 4 mg, Intravenous, Q6H PRN, Jonah Blue, MD   polyethylene glycol (MIRALAX / GLYCOLAX) packet 17 g, 17 g, Oral, Daily PRN, Jonah Blue, MD   sodium chloride flush (NS) 0.9 % injection 3 mL, 3 mL, Intravenous, Q12H, Jonah Blue, MD, 3 mL at 03/15/22 1004  Allergies: Allergies  Allergen Reactions   Paliperidone Swelling and Other (See Comments)    Angioedema, drooling, slurred speech, ptosis   Lisinopril Swelling, Rash and Other (See Comments)    Angioedema, also    Sulfa Antibiotics Itching       Objective  Vital signs:  Temp:  [98 F (36.7 C)-98.1 F (36.7 C)] 98 F (36.7 C) (12/19 1606) Pulse Rate:  [88-95] 95 (12/19 0331) Resp:  [15-18] 18 (12/19 1606) BP: (120-144)/(63-90) 144/80 (12/19 1606) SpO2:  [96 %] 96 % (12/18 2049)  Psychiatric Specialty Exam:  Presentation  General Appearance:  Appropriate for Environment; Casual  Eye Contact:  Fair  Speech: -- (a little easier to understand when speaking ot me. mumbles incoherent.)  Speech Volume: Normal  Handedness:No data recorded  Mood and Affect  Mood: -- (Afraid)  Affect: Restricted   Thought Process  Thought Processes: Disorganized  Descriptions of Associations:Intact  Orientation:Full (Time, Place and Person)  Thought Content:Paranoid Ideation  History of Schizophrenia/Schizoaffective disorder:Yes  Duration of Psychotic Symptoms:Greater than six months  Hallucinations:Hallucinations: -- (Denied. Frequently talks to self.)  Ideas of Reference:None  Suicidal Thoughts:Suicidal Thoughts: No  Homicidal Thoughts:Homicidal Thoughts: No   Sensorium  Memory: Immediate Fair; Recent Fair; Remote  Fair  Judgment: Impaired  Insight: Poor   Executive Functions  Concentration: Poor  Attention Span: Poor  Recall: Fair  Fund of Knowledge: Fair  Language: Fair   Psychomotor Activity  Psychomotor Activity:Psychomotor Activity: Decreased   Assets  Assets: Financial Resources/Insurance; Social Support; Intimacy; Housing   Sleep  Sleep:Sleep: Fair    Physical Exam: Physical Exam Vitals and nursing note reviewed.  Constitutional:      Appearance: Normal appearance. She is normal weight.  HENT:     Head: Normocephalic and atraumatic.  Pulmonary:     Effort: Pulmonary effort is normal.  Neurological:     General: No focal deficit present.     Mental Status: She is oriented to person, place, and time.    Review of Systems  Reason unable to perform ROS: AMS.   Blood pressure (!) 144/80, pulse 95, temperature 98 F (36.7 C), temperature source Oral, resp. rate 18, height 5\' 10"  (1.778 m), weight 87.8 kg, SpO2 96 %. Body mass index is 27.77 kg/m.

## 2022-03-15 NOTE — TOC Transition Note (Signed)
Transition of Care [facility]) - CM/SW Discharge Note   Patient Details  Name: Amy Casey MRN: [id number] Date of Birth: 15-May-1960  [facility] CM/SW Contact:  Lawerance Sabal, RN Phone Number: 03/15/2022, 12:33 PM   Clinical Narrative:     Spoke to patient and son at bedside. Discussed recommendations for HH and RW. Both patient and son declined referrals for [facility], and declined getting RW. Explained to both that it would not be any trouble to set up, but they both confirmed that they do not want HH or DME for DC.  Son  states that he will assist his mother as needed, and that she will have help at home.  TOC signing off, please reconsult for any additional needs.   Final next level of care: Home/Self Care Barriers to Discharge: No Barriers Identified   Patient Goals and CMS Choice Patient states their goals for this hospitalization and ongoing recovery are:: to go home CMS Medicare.gov Compare Post Acute Care list provided to:: Other (Comment Required) Choice offered to / list presented to : Patient, Adult Children  Discharge Placement                       Discharge Plan and Services                DME Arranged: N/A         HH Arranged: Refused HH          Social Determinants of Health (SDOH) Interventions     Readmission Risk Interventions     No data to display

## 2022-03-16 DIAGNOSIS — F061 Catatonic disorder due to known physiological condition: Secondary | ICD-10-CM | POA: Diagnosis not present

## 2022-03-16 DIAGNOSIS — F200 Paranoid schizophrenia: Secondary | ICD-10-CM | POA: Diagnosis not present

## 2022-03-16 LAB — GLUCOSE, CAPILLARY
Glucose-Capillary: 106 mg/dL — ABNORMAL HIGH (ref 70–99)
Glucose-Capillary: 100 mg/dL — ABNORMAL HIGH (ref 70–99)
Glucose-Capillary: 104 mg/dL — ABNORMAL HIGH (ref 70–99)
Glucose-Capillary: 98 mg/dL (ref 70–99)

## 2022-03-16 MED ORDER — HYDRALAZINE HCL 20 MG/ML IJ SOLN: 10.0000 mg | Freq: Four times a day (QID) | INTRAMUSCULAR | Status: DC | PRN

## 2022-03-16 MED ORDER — AMLODIPINE BESYLATE 10 MG PO TABS
10.0000 mg | ORAL_TABLET | Freq: Every day | ORAL | Status: DC
  Administered 2022-03-16 – 2022-03-17 (×2): 10 mg via ORAL
  Filled 2022-03-16 (×2): qty 1

## 2022-03-16 NOTE — Consult Note (Signed)
Brief Psychiatry Consult Note  Saw patient briefly. She was on the phone with her sister, and kept going back and forth between the phone and the interview. Was very difficult to judge attention, thought process, internal preoccupation in this setting. She denied SI, HI, AH/VH; endorsed ongoing mild paranoia. She had not eaten her lunch, but had eaten cake. Up and pacing all night per PT note. Will get 3rd dose of olanzapine tonight; per family typically clears fairly quickly when restarted on meds. See last full consult note on 12/19 for full plan.   I personally spent 15  minutes on the unit in direct patient care. The direct patient care time included face-to-face time with the patient, reviewing the patient's chart, communicating with other professionals, and coordinating care. Greater than 50% of this time was spent in counseling or coordinating care with the patient regarding goals of hospitalization, psycho-education, and discharge planning needs.   Kaedin Hicklin A Charistopher Rumble

## 2022-03-16 NOTE — Progress Notes (Signed)
PROGRESS NOTE                                                                                                                                                                                                             Patient Demographics:    Amy Casey, is a 61 y.o. female, DOB - [birth year], WUJ:[id number]  Outpatient Primary MD for the patient is [name]    LOS - 3  Admit date - 03/12/2022    Chief Complaint  Patient presents with   Psychiatric Evaluation       Brief Narrative (HPI from H&P)     61 y.o. female with medical history significant of depression/schizophrenia; HTN; HLD; and obesity presenting with catatonia. Her adult children are present and provided the history.  The patient had COVID about 5 weeks ago and has been feeling increasingly fatigued and somnolent since.  She may have stopped taking her medications along the way, and she has been so somnolent that she has not been eating and drinking as well recently.  She has had episodes of catatonia in the past and this is consistent with prior episodes    Subjective:   Patient in bed, will answer few questions denies any headache or chest pain.   Assessment  & Plan :    This odes of paranoia with refusal to take medications and lab draws.  Flat affect. -Patient with long-standing history of controlled schizophrenia with periodic bouts of catatonia along with paranoia, per family -Per patient's daughter she gets these episodes of paranoia once or twice a year and this is her typical behavior where she refuses to eat or drink, refuses to take medications, refuses lab draws etc. -She has had negative UA, negative MRI, was little dehydrated and has been adequately hydrated with IV fluids,stable now.. -For management of psych issues to psychiatry.  Medically cleared for disposition on 03/15/2022, await inpatient psych bed.   AKI -Normal renal  function as recently as a year ago  -Due to poor oral intake and dehydration hydrated with IV fluids, resolved.   Possible UTI -Rocephin x 3, follow cultures, UA not very impressive.   Schizophrenia -Psych medications defer to psychiatry team - Psychiatry consulted, inpt Psych bed being looked at.  Medically stable for discharge.   HTN -Placed on oral Lopressor added Norvasc for  better control.  She is refusing medications frequently, will try our best to administer.   HLD -Not taking PO so will hold rosuvastatin   DM -Prior A1c was 6.3, not on medications -Will cover with moderate-scale SSI  CBG (last 3)  Recent Labs    03/15/22 1632 03/15/22 2044 03/16/22 0855  GLUCAP 106* 194* 106*      Obesity -Weight loss should be encouraged -Outpatient PCP/bariatric medicine f/u encouraged         Condition - Fair  Family Communication  : called daughter Wilfred Lacy [phone]  on 03/13/2022 message left at 11:18 AM, updated 03/14/22  Code Status :  Full  Consults  :  Psych  PUD Prophylaxis :    Procedures  :     MRI -  1. No acute intracranial abnormality. The right PCA/occipital appearance on CT today was Artifact. 2. But advanced signal abnormality in the bilateral cerebral white matter, deep gray nuclei, and pons - progressed since a 2015 MRI. This is nonspecific but may be related to chronic small vessel disease  TTE -  1. Left ventricular ejection fraction, by estimation, is 50 to 55%. The left ventricle has low normal function. Left ventricular endocardial border not optimally defined to evaluate regional wall motion. There is mild left ventricular hypertrophy. Left ventricular diastolic parameters are consistent with Grade I diastolic dysfunction (impaired relaxation).  2. Right ventricular systolic function is normal. The right ventricular size is normal. There is normal pulmonary artery systolic pressure.  3. The mitral valve is grossly normal. No evidence of mitral  valve regurgitation. No evidence of mitral stenosis.  4. The aortic valve was not well visualized. Aortic valve regurgitation is not visualized. No aortic stenosis is present.  5. The inferior vena cava is normal in size with greater than 50% respiratory variability, suggesting right atrial pressure of 3 mmHg.  6. Cannot exclude a small PFO.      Disposition Plan  :    Status is: Observation  DVT Prophylaxis  :    enoxaparin (LOVENOX) injection 40 mg Start: 03/12/22 1930    Lab Results  Component Value Date   PLT 312 03/15/2022    Diet :  Diet Order             DIET SOFT Room service appropriate? Yes with Assist; Fluid consistency: Thin  Diet effective now                    Inpatient Medications  Scheduled Meds:  enoxaparin (LOVENOX) injection  40 mg Subcutaneous Q24H   feeding supplement  237 mL Oral BID BM   insulin aspart  0-15 Units Subcutaneous TID WC   insulin aspart  0-5 Units Subcutaneous QHS   metoprolol tartrate  50 mg Oral BID   multivitamin with minerals  1 tablet Oral Daily   OLANZapine  5 mg Oral QHS   sodium chloride flush  3 mL Intravenous Q12H   Continuous Infusions:   PRN Meds:.acetaminophen **OR** acetaminophen, bisacodyl, diltiazem, hydrALAZINE, ondansetron **OR** ondansetron (ZOFRAN) IV, polyethylene glycol    Objective:   Vitals:   03/16/22 0410 03/16/22 0600 03/16/22 0800 03/16/22 0830  BP: (!) 149/94  (!) 149/101 (!) 153/141  Pulse: 72 77 75 71  Resp: Temp: 98.2 F (36.8 C)   97.9 F (36.6 C)  TempSrc: Oral   Axillary  SpO2: 97% 95% 96% 96%  Weight:      Height:  Wt Readings from Last 3 Encounters:  03/13/22 87.8 kg  03/04/22 93.9 kg  09/07/21 102.4 kg    No intake or output data in the 24 hours ending 03/16/22 1042    Physical Exam  Awake, No new F.N deficits, flat affect [place].AT,PERRAL Supple Neck, No JVD,   Symmetrical Chest wall movement, Good air movement bilaterally, CTAB RRR,No Gallops,  Rubs or new Murmurs,  +ve B.Sounds, Abd Soft, No tenderness,   No Cyanosis, Clubbing or edema      Data Review:    Recent Labs  Lab 03/12/22 1128 03/13/22 0434 03/15/22 0514  WBC 10.8* 7.5 8.1  HGB 14.5 12.3 12.3  HCT 44.7 39.4 37.9  PLT 350 349 312  MCV 90.9 91.8 89.6  MCH 29.5 28.7 29.1  MCHC 32.4 31.2 32.5  RDW 13.6 13.7 13.3  LYMPHSABS 1.6  --  2.2  MONOABS 0.9  --  0.7  EOSABS 0.4  --  0.5  BASOSABS 0.1  --  0.0    Recent Labs  Lab 03/12/22 1128 03/13/22 0434 03/14/22 1417 03/15/22 0514  NA 142 145 145 145  K 5.5* 3.6 3.3* 3.6  CL 106 106 109 113*  CO2 24 28 28 24   ANIONGAP 12 11 8 8   GLUCOSE 112* 102* 167* 113*  BUN 95* 73* 41* 29*  CREATININE 2.19* 1.80* 1.34* 1.21*  AST 40  --   --   --   ALT 26  --   --   --   ALKPHOS 83  --   --   --   BILITOT 1.3*  --   --   --   ALBUMIN 4.3  --   --   --   BNP  --  13.1  --  70.0  MG  --  2.4 2.0 1.8  CALCIUM 9.5 9.2 9.0 9.1      Radiology Reports ECHOCARDIOGRAM COMPLETE  Result Date: 03/13/2022    ECHOCARDIOGRAM REPORT   Patient Name:   SHANDRELL BODA Date of Exam: 03/13/2022 Medical Rec #:  [name]       Height:       70.0 in Accession #:    03/15/2022      Weight:       207.0 lb Date of Birth:  1960/06/27       BSA:          2.118 m Patient Age:    61 years        BP:           124/79 mmHg Patient Gender: F               HR:           108 bpm. Exam Location:  Inpatient Procedure: 2D Echo Indications:    acute diastolic chf  History:        Patient has no prior history of Echocardiogram examinations.                 Signs/Symptoms:Altered Mental Status; Risk Factors:Diabetes,                 Hypertension and Dyslipidemia.  Sonographer:    [id number] RDCS Referring Phys: 03/04/1959 [facility]  Sonographer Comments: Technically difficult study due to poor echo windows. Image acquisition challenging due to respiratory motion. IMPRESSIONS  1. Left ventricular ejection fraction, by estimation, is 50 to 55%.  The left ventricle has low normal function. Left ventricular endocardial border not optimally defined to  evaluate regional wall motion. There is mild left ventricular hypertrophy. Left ventricular diastolic parameters are consistent with Grade I diastolic dysfunction (impaired relaxation).  2. Right ventricular systolic function is normal. The right ventricular size is normal. There is normal pulmonary artery systolic pressure.  3. The mitral valve is grossly normal. No evidence of mitral valve regurgitation. No evidence of mitral stenosis.  4. The aortic valve was not well visualized. Aortic valve regurgitation is not visualized. No aortic stenosis is present.  5. The inferior vena cava is normal in size with greater than 50% respiratory variability, suggesting right atrial pressure of 3 mmHg.  6. Cannot exclude a small PFO. Comparison(s): No prior Echocardiogram. FINDINGS  Left Ventricle: Left ventricular ejection fraction, by estimation, is 50 to 55%. The left ventricle has low normal function. Left ventricular endocardial border not optimally defined to evaluate regional wall motion. The left ventricular internal cavity  size was normal in size. There is mild left ventricular hypertrophy. Left ventricular diastolic parameters are consistent with Grade I diastolic dysfunction (impaired relaxation). Right Ventricle: The right ventricular size is normal. No increase in right ventricular wall thickness. Right ventricular systolic function is normal. There is normal pulmonary artery systolic pressure. The tricuspid regurgitant velocity is 1.93 m/s, and  with an assumed right atrial pressure of 3 mmHg, the estimated right ventricular systolic pressure is 17.9 mmHg. Left Atrium: Left atrial size was normal in size. Right Atrium: Right atrial size was normal in size. Pericardium: There is no evidence of pericardial effusion. Mitral Valve: The mitral valve is grossly normal. No evidence of mitral valve regurgitation. No  evidence of mitral valve stenosis. Tricuspid Valve: The tricuspid valve is normal in structure. Tricuspid valve regurgitation is not demonstrated. No evidence of tricuspid stenosis. Aortic Valve: The aortic valve was not well visualized. Aortic valve regurgitation is not visualized. No aortic stenosis is present. Pulmonic Valve: The pulmonic valve was not well visualized. Pulmonic valve regurgitation is not visualized. No evidence of pulmonic stenosis. Aorta: The aortic root and ascending aorta are structurally normal, with no evidence of dilitation. Venous: The inferior vena cava is normal in size with greater than 50% respiratory variability, suggesting right atrial pressure of 3 mmHg. IAS/Shunts: Cannot exclude a small PFO.  LEFT VENTRICLE PLAX 2D LVIDd:         4.80 cm     Diastology LVIDs:         3.70 cm     LV e' medial:    8.05 cm/s LV PW:         1.20 cm     LV E/e' medial:  8.3 LV IVS:        1.00 cm     LV e' lateral:   9.79 cm/s LVOT diam:     2.20 cm     LV E/e' lateral: 6.8 LV SV:         54 LV SV Index:   25 LVOT Area:     3.80 cm  LV Volumes (MOD) LV vol d, MOD A2C: 51.2 ml LV vol d, MOD A4C: 57.5 ml LV SV MOD A4C:     57.5 ml RIGHT VENTRICLE             IVC RV Basal diam:  2.60 cm     IVC diam: 1.10 cm RV S prime:     20.30 cm/s TAPSE (M-mode): 1.6 cm LEFT ATRIUM           Index  RIGHT ATRIUM           Index LA diam:      3.30 cm 1.56 cm/m   RA Area:     14.50 cm LA Vol (A4C): 36.6 ml 17.28 ml/m  RA Volume:   35.90 ml  16.95 ml/m  AORTIC VALVE LVOT Vmax:   110.00 cm/s LVOT Vmean:  69.000 cm/s LVOT VTI:    0.142 m  AORTA Ao Root diam: 3.10 cm Ao Asc diam:  3.60 cm MV E velocity: 67.00 cm/s   TRICUSPID VALVE MV A velocity: 118.00 cm/s  TR Peak grad:   14.9 mmHg MV E/A ratio:  0.57         TR Vmax:        193.00 cm/s                              SHUNTS                             Systemic VTI:  0.14 m                             Systemic Diam: 2.20 cm Riley Lam MD Electronically  signed by Riley Lam MD Signature Date/Time: 03/13/2022/6:33:05 PM    Final    MR BRAIN WO CONTRAST  Result Date: 03/12/2022 CLINICAL DATA:  61 year old female with altered mental status. Questionable right PCA territory edema on plain head CT today. History of schizophrenia. EXAM: MRI HEAD WITHOUT CONTRAST TECHNIQUE: Multiplanar, multiecho pulse sequences of the brain and surrounding structures were obtained without intravenous contrast. COMPARISON:  Plain head CT 1118 hours.  Brain MRI 02/21/2014. FINDINGS: Brain: No restricted diffusion to suggest acute infarction. No midline shift, mass effect, evidence of mass lesion, ventriculomegaly, extra-axial collection or acute intracranial hemorrhage. Cervicomedullary junction and pituitary are within normal limits. No evidence of acute right occipital lobe edema. But progressed chronic widespread bilateral cerebral white matter T2 and FLAIR signal hyperintensity. And now similar signal heterogeneity in the bilateral deep gray nuclei and pons (series 5 images 8 in 14). No cortical encephalomalacia identified. Solitary chronic microhemorrhage in the left periventricular white matter on series 7, image 65. Cerebellum appears to remain normal. Vascular: Major intracranial vascular flow voids are stable since 2015. Skull and upper cervical spine: Negative for age visible cervical spine. Visualized bone marrow signal is within normal limits. Sinuses/Orbits: Negative orbits. Trace paranasal sinus mucosal thickening. Other: Mastoids are clear. Visible internal auditory structures appear normal. Negative visible scalp and face. IMPRESSION: 1. No acute intracranial abnormality. The right PCA/occipital appearance on CT today was Artifact. 2. But advanced signal abnormality in the bilateral cerebral white matter, deep gray nuclei, and pons - progressed since a 2015 MRI. This is nonspecific but may be related to chronic small vessel disease. Electronically Signed    By: Odessa Fleming M.D.   On: 03/12/2022 14:09   CT HEAD WO CONTRAST ( )  Result Date: 03/12/2022 CLINICAL DATA:  61 year old female altered mental status. EXAM: CT HEAD WITHOUT CONTRAST TECHNIQUE: Contiguous axial images were obtained from the base of the skull through the vertex without intravenous contrast. RADIATION DOSE REDUCTION: This exam was performed according to the departmental dose-optimization program which includes automated exposure control, adjustment of the mA and/or kV according to patient size and/or use of iterative reconstruction technique. COMPARISON:  Brain  MRI 02/21/2014.  Head CT 07/30/2018. FINDINGS: Brain: Stable cerebral volume since 2020. No midline shift, ventriculomegaly, mass effect, evidence of mass lesion, intracranial hemorrhage. Patchy and confluent bilateral cerebral white matter hypodensity appears more pronounced in the right hemisphere and mildly progressed since 2020. Real versus artifactual confluent hypodensity in the right occipital lobe today on series 2, image 11. Legrand Rams this is artifact. No other acute cortically based infarct identified. Vascular: Calcified atherosclerosis at the skull base. Skull: No acute osseous abnormality identified. Sinuses/Orbits: Visualized paranasal sinuses and mastoids are stable and well aerated. Other: No acute orbit or scalp soft tissue finding. Small chronic right frontal scalp benign lipoma. IMPRESSION: 1. Suggestion of Right PCA/occipital lobe infarct with cytotoxic edema could be artifact. No associated hemorrhage or mass effect. Query left vision changes. Noncontrast Brain MRI would be confirmatory. 2. Otherwise advanced chronic cerebral white matter disease with some progression Electronically Signed   By: Odessa Fleming M.D.   On: 03/12/2022 11:36   DG Chest Portable 1 View  Result Date: 03/12/2022 CLINICAL DATA:  Altered mental status EXAM: PORTABLE CHEST 1 VIEW COMPARISON:  04/21/2019 FINDINGS: Transverse diameter of heart is  increased. Thoracic aorta is tortuous and ectatic. Lung fields are clear of any infiltrates or pulmonary edema. There is no pleural effusion or pneumothorax. IMPRESSION: Cardiomegaly. There are no signs of pulmonary edema or focal pulmonary consolidation. Electronically Signed   By: Ernie Avena M.D.   On: 03/12/2022 11:25      Signature  -   Susa Raring M.D on 03/16/2022 at 10:42 AM   -  To page go to [email or web address]

## 2022-03-16 NOTE — Progress Notes (Signed)
PT Cancellation Note  Patient Details Name: Amy Casey MRN: [id number] DOB: Sep 29, 1960   Cancelled Treatment:    Reason Eval/Treat Not Completed: Other (comment)  Consistently declining OOB or amb, despite gentle encouragement;   Noteworthy that pt was up and pacing in her room the previous night per her nurse;   Will continue to follow;  Van Clines, PT  Acute Rehabilitation Services Office [phone]    Levi Aland 03/16/2022, 2:26 PM

## 2022-03-16 NOTE — Progress Notes (Addendum)
9:15am-No beds available at [facility]. CSW sent referral to [facility] for review.   9:36am-[facility] does not have beds available for today. Will fax to [facility] and [facility].   4pm-Declined by Yvetta Coder.    Joaquin Courts, MSW, [facility]

## 2022-03-17 ENCOUNTER — Encounter: Payer: Self-pay | Admitting: Psychiatry

## 2022-03-17 ENCOUNTER — Other Ambulatory Visit: Payer: Self-pay

## 2022-03-17 ENCOUNTER — Inpatient Hospital Stay
Admission: AD | Admit: 2022-03-17 | Discharge: 2022-03-30 | DRG: 885 | Disposition: A | Discharge status: Home / Self Care | Payer: No Typology Code available for payment source | Source: Intra-hospital | Attending: Psychiatry | Admitting: Psychiatry

## 2022-03-17 DIAGNOSIS — Z841 Family history of disorders of kidney and ureter: Secondary | ICD-10-CM

## 2022-03-17 DIAGNOSIS — Z91148 Patient's other noncompliance with medication regimen for other reason: Secondary | ICD-10-CM | POA: Diagnosis not present

## 2022-03-17 DIAGNOSIS — Z882 Allergy status to sulfonamides status: Secondary | ICD-10-CM | POA: Diagnosis not present

## 2022-03-17 DIAGNOSIS — Z8616 Personal history of COVID-19: Secondary | ICD-10-CM | POA: Diagnosis not present

## 2022-03-17 DIAGNOSIS — I10 Essential (primary) hypertension: Secondary | ICD-10-CM | POA: Diagnosis present

## 2022-03-17 DIAGNOSIS — F061 Catatonic disorder due to known physiological condition: Secondary | ICD-10-CM | POA: Diagnosis present

## 2022-03-17 DIAGNOSIS — E785 Hyperlipidemia, unspecified: Secondary | ICD-10-CM | POA: Diagnosis present

## 2022-03-17 DIAGNOSIS — Z833 Family history of diabetes mellitus: Secondary | ICD-10-CM

## 2022-03-17 DIAGNOSIS — F419 Anxiety disorder, unspecified: Secondary | ICD-10-CM | POA: Diagnosis present

## 2022-03-17 DIAGNOSIS — Z87442 Personal history of urinary calculi: Secondary | ICD-10-CM | POA: Diagnosis not present

## 2022-03-17 DIAGNOSIS — Z801 Family history of malignant neoplasm of trachea, bronchus and lung: Secondary | ICD-10-CM | POA: Diagnosis not present

## 2022-03-17 DIAGNOSIS — Z888 Allergy status to other drugs, medicaments and biological substances status: Secondary | ICD-10-CM

## 2022-03-17 DIAGNOSIS — R413 Other amnesia: Secondary | ICD-10-CM | POA: Diagnosis present

## 2022-03-17 DIAGNOSIS — Z8249 Family history of ischemic heart disease and other diseases of the circulatory system: Secondary | ICD-10-CM

## 2022-03-17 DIAGNOSIS — F32A Depression, unspecified: Secondary | ICD-10-CM | POA: Diagnosis present

## 2022-03-17 DIAGNOSIS — F203 Undifferentiated schizophrenia: Secondary | ICD-10-CM | POA: Diagnosis present

## 2022-03-17 LAB — RESP PANEL BY RT-PCR (RSV, FLU A&B, COVID)  RVPGX2
Influenza A by PCR: NEGATIVE
Influenza B by PCR: NEGATIVE
Resp Syncytial Virus by PCR: NEGATIVE
SARS Coronavirus 2 by RT PCR: NEGATIVE

## 2022-03-17 LAB — CULTURE, BLOOD (ROUTINE X 2)
Culture: NO GROWTH
Special Requests: ADEQUATE

## 2022-03-17 LAB — GLUCOSE, CAPILLARY
Glucose-Capillary: 109 mg/dL — ABNORMAL HIGH (ref 70–99)
Glucose-Capillary: 98 mg/dL (ref 70–99)
Glucose-Capillary: 111 mg/dL — ABNORMAL HIGH (ref 70–99)

## 2022-03-17 MED ORDER — BENZTROPINE MESYLATE 1 MG/ML IJ SOLN: 0.5000 mg | Freq: Four times a day (QID) | INTRAMUSCULAR | Status: DC | PRN

## 2022-03-17 MED ORDER — HALOPERIDOL LACTATE 5 MG/ML IJ SOLN: 2.0000 mg | Freq: Four times a day (QID) | INTRAMUSCULAR | Status: DC | PRN

## 2022-03-17 MED ORDER — HALOPERIDOL 1 MG PO TABS: 2.0000 mg | ORAL_TABLET | Freq: Four times a day (QID) | ORAL | Status: DC | PRN

## 2022-03-17 MED ORDER — BENZTROPINE MESYLATE 1 MG PO TABS: 1.0000 mg | ORAL_TABLET | Freq: Four times a day (QID) | ORAL | Status: DC | PRN

## 2022-03-17 MED ORDER — OLANZAPINE 5 MG PO TABS
5.0000 mg | ORAL_TABLET | Freq: Every day | ORAL | Status: DC
  Filled 2022-03-17: qty 1

## 2022-03-17 MED ORDER — ALUM & MAG HYDROXIDE-SIMETH 200-200-20 MG/5ML PO SUSP: 30.0000 mL | ORAL | Status: DC | PRN

## 2022-03-17 MED ORDER — DILTIAZEM HCL 25 MG/5ML IV SOLN: 10.0000 mg | Freq: Four times a day (QID) | INTRAVENOUS | Status: DC | PRN

## 2022-03-17 MED ORDER — MAGNESIUM HYDROXIDE 400 MG/5ML PO SUSP: 30.0000 mL | Freq: Every day | ORAL | Status: DC | PRN

## 2022-03-17 MED ORDER — METOPROLOL TARTRATE 50 MG PO TABS: 50.0000 mg | ORAL_TABLET | Freq: Two times a day (BID) | ORAL | Status: DC

## 2022-03-17 MED ORDER — ADULT MULTIVITAMIN W/MINERALS CH
1.0000 | ORAL_TABLET | Freq: Every day | ORAL | Status: DC
  Administered 2022-03-18 – 2022-03-30 (×13): 1 via ORAL
  Filled 2022-03-17 (×13): qty 1

## 2022-03-17 MED ORDER — INSULIN ASPART 100 UNIT/ML IJ SOLN: 0.0000 [IU] | Freq: Every day | INTRAMUSCULAR | Status: DC

## 2022-03-17 MED ORDER — POLYETHYLENE GLYCOL 3350 17 G PO PACK: 17.0000 g | PACK | Freq: Every day | ORAL | Status: DC | PRN

## 2022-03-17 MED ORDER — AMLODIPINE BESYLATE 5 MG PO TABS
10.0000 mg | ORAL_TABLET | Freq: Every day | ORAL | Status: DC
  Administered 2022-03-18 – 2022-03-30 (×13): 10 mg via ORAL
  Filled 2022-03-17 (×13): qty 2

## 2022-03-17 MED ORDER — BISACODYL 5 MG PO TBEC: 5.0000 mg | DELAYED_RELEASE_TABLET | Freq: Every day | ORAL | Status: DC | PRN

## 2022-03-17 MED ORDER — INSULIN ASPART 100 UNIT/ML IJ SOLN
0.0000 [IU] | Freq: Three times a day (TID) | INTRAMUSCULAR | Status: DC
  Administered 2022-03-18: 2 [IU] via SUBCUTANEOUS
  Filled 2022-03-17: qty 1

## 2022-03-17 MED ORDER — ENSURE ENLIVE PO LIQD
237.0000 mL | Freq: Two times a day (BID) | ORAL | Status: DC
  Administered 2022-03-20 – 2022-03-24 (×6): 237 mL via ORAL

## 2022-03-17 MED ORDER — METOPROLOL TARTRATE 25 MG PO TABS
50.0000 mg | ORAL_TABLET | Freq: Two times a day (BID) | ORAL | Status: DC
  Administered 2022-03-18 – 2022-03-30 (×21): 50 mg via ORAL
  Filled 2022-03-17 (×24): qty 2

## 2022-03-17 MED ORDER — ACETAMINOPHEN 325 MG PO TABS: 650.0000 mg | ORAL_TABLET | Freq: Four times a day (QID) | ORAL | Status: DC | PRN

## 2022-03-17 NOTE — Progress Notes (Addendum)
PROGRESS NOTE                                                                                                                                                                                                             Patient Demographics:    Amy Casey, is a 61 y.o. female, DOB - 1960/07/08, YQI:[id number]  Outpatient Primary MD for the patient is [name]    LOS - 4  Admit date - 03/12/2022    Chief Complaint  Patient presents with   Psychiatric Evaluation       Brief Narrative (HPI from H&P)     61 y.o. female with medical history significant of depression/schizophrenia; HTN; HLD; and obesity presenting with catatonia. Her adult children are present and provided the history.  The patient had COVID about 5 weeks ago and has been feeling increasingly fatigued and somnolent since.  She may have stopped taking her medications along the way, and she has been so somnolent that she has not been eating and drinking as well recently.  She has had episodes of catatonia in the past and this is consistent with prior episodes    Subjective:   In bed in no distress comfortable, will not answer questions reliably, mumbling words off-and-on.   Assessment  & Plan :    This odes of paranoia with refusal to take medications and lab draws.  Flat affect. -Patient with long-standing history of controlled schizophrenia with periodic bouts of catatonia along with paranoia, per family -Per patient's daughter she gets these episodes of paranoia once or twice a year and this is her typical behavior where she refuses to eat or drink, refuses to take medications, refuses lab draws etc. -She has had negative UA, negative MRI, was little dehydrated and has been adequately hydrated with IV fluids,stable now.. -For management of psych issues to psychiatry.  Medically cleared for disposition on 03/15/2022, await inpatient psych bed.    AKI -Normal renal function as recently as a year ago  -Due to poor oral intake and dehydration hydrated with IV fluids, resolved.   Possible UTI - is post Rocephin x 3, follow cultures, UA not very impressive.   Schizophrenia -Psych medications defer to psychiatry team, psychiatry team has recommended inpatient psych admission, she is medically stable for discharge.  Await inpatient psych bed.  HTN -Placed on oral Lopressor added Norvasc for better control.  She is refusing medications frequently, will try our best to administer.   HLD -Not taking PO so will hold rosuvastatin   DM -Prior A1c was 6.3, not on medications -Will cover with moderate-scale SSI  CBG (last 3)  Recent Labs    03/16/22 1250 03/16/22 1653 03/16/22 2119  GLUCAP 100* 104* 98      Obesity -Weight loss should be encouraged -Outpatient PCP/bariatric medicine f/u encouraged         Condition - Fair  Family Communication  :   Called daughter Wilfred Lacy [phone]  on 03/13/2022 message left at 11:18 AM, updated 03/14/22.    Husband [name] [phone]  on 03/17/2022  Code Status :  Full  Consults  :  Psych  PUD Prophylaxis :    Procedures  :     MRI -  1. No acute intracranial abnormality. The right PCA/occipital appearance on CT today was Artifact. 2. But advanced signal abnormality in the bilateral cerebral white matter, deep gray nuclei, and pons - progressed since a 2015 MRI. This is nonspecific but may be related to chronic small vessel disease  TTE -  1. Left ventricular ejection fraction, by estimation, is 50 to 55%. The left ventricle has low normal function. Left ventricular endocardial border not optimally defined to evaluate regional wall motion. There is mild left ventricular hypertrophy. Left ventricular diastolic parameters are consistent with Grade I diastolic dysfunction (impaired relaxation).  2. Right ventricular systolic function is normal. The right ventricular size is  normal. There is normal pulmonary artery systolic pressure.  3. The mitral valve is grossly normal. No evidence of mitral valve regurgitation. No evidence of mitral stenosis.  4. The aortic valve was not well visualized. Aortic valve regurgitation is not visualized. No aortic stenosis is present.  5. The inferior vena cava is normal in size with greater than 50% respiratory variability, suggesting right atrial pressure of 3 mmHg.  6. Cannot exclude a small PFO.      Disposition Plan  :    Status is: Observation  DVT Prophylaxis  :    enoxaparin (LOVENOX) injection 40 mg Start: 03/12/22 1930    Lab Results  Component Value Date   PLT 312 03/15/2022    Diet :  Diet Order             DIET SOFT Room service appropriate? Yes with Assist; Fluid consistency: Thin  Diet effective now                    Inpatient Medications  Scheduled Meds:  amLODipine  10 mg Oral Daily   enoxaparin (LOVENOX) injection  40 mg Subcutaneous Q24H   feeding supplement  237 mL Oral BID BM   insulin aspart  0-15 Units Subcutaneous TID WC   insulin aspart  0-5 Units Subcutaneous QHS   metoprolol tartrate  50 mg Oral BID   multivitamin with minerals  1 tablet Oral Daily   OLANZapine  5 mg Oral QHS   sodium chloride flush  3 mL Intravenous Q12H   Continuous Infusions:   PRN Meds:.acetaminophen **OR** acetaminophen, bisacodyl, diltiazem, hydrALAZINE, ondansetron **OR** ondansetron (ZOFRAN) IV, polyethylene glycol    Objective:   Vitals:   03/16/22 2330 03/17/22 0011 03/17/22 0012 03/17/22 0341  BP:    128/78  Pulse:    88  Resp: (!) 21 14 14 17   Temp:    98 F (36.7 C)  TempSrc:  Oral  SpO2:      Weight:      Height:        Wt Readings from Last 3 Encounters:  03/13/22 87.8 kg  03/04/22 93.9 kg  09/07/21 102.4 kg     Intake/Output Summary (Last 24 hours) at 03/17/2022 0816 Last data filed at 03/16/2022 1800 Gross per 24 hour  Intake 120 ml  Output --  Net 120 ml       Physical Exam  Awake, No new F.N deficits, flat affect Miranda.AT,PERRAL Supple Neck, No JVD,   Symmetrical Chest wall movement, Good air movement bilaterally, CTAB RRR,No Gallops, Rubs or new Murmurs,  +ve B.Sounds, Abd Soft, No tenderness,   No Cyanosis, Clubbing or edema      Data Review:    Recent Labs  Lab 03/12/22 1128 03/13/22 0434 03/15/22 0514  WBC 10.8* 7.5 8.1  HGB 14.5 12.3 12.3  HCT 44.7 39.4 37.9  PLT 350 349 312  MCV 90.9 91.8 89.6  MCH 29.5 28.7 29.1  MCHC 32.4 31.2 32.5  RDW 13.6 13.7 13.3  LYMPHSABS 1.6  --  2.2  MONOABS 0.9  --  0.7  EOSABS 0.4  --  0.5  BASOSABS 0.1  --  0.0    Recent Labs  Lab 03/12/22 1128 03/13/22 0434 03/14/22 1417 03/15/22 0514  NA 142 145 145 145  K 5.5* 3.6 3.3* 3.6  CL 106 106 109 113*  CO2 24 28 28 24   ANIONGAP 12 11 8 8   GLUCOSE 112* 102* 167* 113*  BUN 95* 73* 41* 29*  CREATININE 2.19* 1.80* 1.34* 1.21*  AST 40  --   --   --   ALT 26  --   --   --   ALKPHOS 83  --   --   --   BILITOT 1.3*  --   --   --   ALBUMIN 4.3  --   --   --   BNP  --  13.1  --  70.0  MG  --  2.4 2.0 1.8  CALCIUM 9.5 9.2 9.0 9.1      Radiology Reports ECHOCARDIOGRAM COMPLETE  Result Date: 03/13/2022    ECHOCARDIOGRAM REPORT   Patient Name:   CHEN HOLZMAN Date of Exam: 03/13/2022 Medical Rec #:  [name]       Height:       70.0 in Accession #:    03/15/2022      Weight:       207.0 lb Date of Birth:  Feb 27, 1961       BSA:          2.118 m Patient Age:    61 years        BP:           124/79 mmHg Patient Gender: F               HR:           108 bpm. Exam Location:  Inpatient Procedure: 2D Echo Indications:    acute diastolic chf  History:        Patient has no prior history of Echocardiogram examinations.                 Signs/Symptoms:Altered Mental Status; Risk Factors:Diabetes,                 Hypertension and Dyslipidemia.  Sonographer:    [id number] RDCS Referring Phys: 03/04/1961 Delcie Roch [facility]  Sonographer Comments:  Technically  difficult study due to poor echo windows. Image acquisition challenging due to respiratory motion. IMPRESSIONS  1. Left ventricular ejection fraction, by estimation, is 50 to 55%. The left ventricle has low normal function. Left ventricular endocardial border not optimally defined to evaluate regional wall motion. There is mild left ventricular hypertrophy. Left ventricular diastolic parameters are consistent with Grade I diastolic dysfunction (impaired relaxation).  2. Right ventricular systolic function is normal. The right ventricular size is normal. There is normal pulmonary artery systolic pressure.  3. The mitral valve is grossly normal. No evidence of mitral valve regurgitation. No evidence of mitral stenosis.  4. The aortic valve was not well visualized. Aortic valve regurgitation is not visualized. No aortic stenosis is present.  5. The inferior vena cava is normal in size with greater than 50% respiratory variability, suggesting right atrial pressure of 3 mmHg.  6. Cannot exclude a small PFO. Comparison(s): No prior Echocardiogram. FINDINGS  Left Ventricle: Left ventricular ejection fraction, by estimation, is 50 to 55%. The left ventricle has low normal function. Left ventricular endocardial border not optimally defined to evaluate regional wall motion. The left ventricular internal cavity  size was normal in size. There is mild left ventricular hypertrophy. Left ventricular diastolic parameters are consistent with Grade I diastolic dysfunction (impaired relaxation). Right Ventricle: The right ventricular size is normal. No increase in right ventricular wall thickness. Right ventricular systolic function is normal. There is normal pulmonary artery systolic pressure. The tricuspid regurgitant velocity is 1.93 m/s, and  with an assumed right atrial pressure of 3 mmHg, the estimated right ventricular systolic pressure is 17.9 mmHg. Left Atrium: Left atrial size was normal in size. Right Atrium:  Right atrial size was normal in size. Pericardium: There is no evidence of pericardial effusion. Mitral Valve: The mitral valve is grossly normal. No evidence of mitral valve regurgitation. No evidence of mitral valve stenosis. Tricuspid Valve: The tricuspid valve is normal in structure. Tricuspid valve regurgitation is not demonstrated. No evidence of tricuspid stenosis. Aortic Valve: The aortic valve was not well visualized. Aortic valve regurgitation is not visualized. No aortic stenosis is present. Pulmonic Valve: The pulmonic valve was not well visualized. Pulmonic valve regurgitation is not visualized. No evidence of pulmonic stenosis. Aorta: The aortic root and ascending aorta are structurally normal, with no evidence of dilitation. Venous: The inferior vena cava is normal in size with greater than 50% respiratory variability, suggesting right atrial pressure of 3 mmHg. IAS/Shunts: Cannot exclude a small PFO.  LEFT VENTRICLE PLAX 2D LVIDd:         4.80 cm     Diastology LVIDs:         3.70 cm     LV e' medial:    8.05 cm/s LV PW:         1.20 cm     LV E/e' medial:  8.3 LV IVS:        1.00 cm     LV e' lateral:   9.79 cm/s LVOT diam:     2.20 cm     LV E/e' lateral: 6.8 LV SV:         54 LV SV Index:   25 LVOT Area:     3.80 cm  LV Volumes (MOD) LV vol d, MOD A2C: 51.2 ml LV vol d, MOD A4C: 57.5 ml LV SV MOD A4C:     57.5 ml RIGHT VENTRICLE             IVC RV Basal  diam:  2.60 cm     IVC diam: 1.10 cm RV S prime:     20.30 cm/s TAPSE (M-mode): 1.6 cm LEFT ATRIUM           Index        RIGHT ATRIUM           Index LA diam:      3.30 cm 1.56 cm/m   RA Area:     14.50 cm LA Vol (A4C): 36.6 ml 17.28 ml/m  RA Volume:   35.90 ml  16.95 ml/m  AORTIC VALVE LVOT Vmax:   110.00 cm/s LVOT Vmean:  69.000 cm/s LVOT VTI:    0.142 m  AORTA Ao Root diam: 3.10 cm Ao Asc diam:  3.60 cm MV E velocity: 67.00 cm/s   TRICUSPID VALVE MV A velocity: 118.00 cm/s  TR Peak grad:   14.9 mmHg MV E/A ratio:  0.57         TR Vmax:         193.00 cm/s                              SHUNTS                             Systemic VTI:  0.14 m                             Systemic Diam: 2.20 cm Riley LamMahesh Chandrasekhar MD Electronically signed by Riley LamMahesh Chandrasekhar MD Signature Date/Time: 03/13/2022/6:33:05 PM    Final       Signature  -   Susa RaringPrashant Chapin Arduini M.D on 03/17/2022 at 8:16 AM   -  To page go to [email or web address]

## 2022-03-17 NOTE — Progress Notes (Signed)
Occupational Therapy Treatment Patient Details Name: Clarivel Callaway MRN: [id number] DOB: 10/01/1960 Today's Date: 03/17/2022   History of present illness Patient is a 61 y/o female who presents on 12/16 from [facility] with catatonia, transferred to [facility] with concern for CVAS. Found to have delirium likely due to UTI and AKI. PMH includes depression, paranoid schizophrenia, HTN, obesity.   OT comments  Patient received in supine and agreeable to OT session. Patient required increased time to follow directions and physical assistance to initiate tasks. Patient often stating, "I will in a few minutes" without initiation. Patient would benefit from further OT services to continue to address self care and functional transfers. Acute OT to continue to follow.    Recommendations for follow up therapy are one component of a multi-disciplinary discharge planning process, led by the attending physician.  Recommendations may be updated based on patient status, additional functional criteria and insurance authorization.    Follow Up Recommendations  Home health OT     Assistance Recommended at Discharge Frequent or constant Supervision/Assistance  Patient can return home with the following  A little help with walking and/or transfers;A lot of help with bathing/dressing/bathroom;Assistance with cooking/housework;Direct supervision/assist for medications management;Direct supervision/assist for financial management;Assist for transportation;Help with stairs or ramp for entrance   Equipment Recommendations  BSC/3in1    Recommendations for Other Services      Precautions / Restrictions Precautions Precautions: Fall Restrictions Weight Bearing Restrictions: No       Mobility Bed Mobility Overal bed mobility: Needs Assistance Bed Mobility: Supine to Sit     Supine to sit: Mod assist, HOB elevated     General bed mobility comments: difficulty initiating task and required physical assistance to begin  and complete    Transfers Overall transfer level: Needs assistance Equipment used: 1 person hand held assist Transfers: Sit to/from Stand, Bed to chair/wheelchair/BSC Sit to Stand: Mod assist     Step pivot transfers: Mod assist     General transfer comment: patient demonstrating posterior leaning and assistance for balance     Balance Overall balance assessment: Needs assistance Sitting-balance support: Feet supported, No upper extremity supported Sitting balance-Leahy Scale: Good   Postural control: Posterior lean Standing balance support: During functional activity Standing balance-Leahy Scale: Poor Standing balance comment: reliant on therapist for assistance for balance                           ADL either performed or assessed with clinical judgement   ADL Overall ADL's : Needs assistance/impaired     Grooming: Supervision/safety;Cueing for sequencing;Sitting;Wash/dry hands;Wash/dry face;Oral care;Brushing hair Grooming Details (indicate cue type and reason): increased time to perform                               General ADL Comments: increased time to participate in self care activities    Extremity/Trunk Assessment              Vision       Perception     Praxis      Cognition Arousal/Alertness: Awake/alert Behavior During Therapy: Flat affect Overall Cognitive Status: No family/caregiver present to determine baseline cognitive functioning Area of Impairment: Orientation, Attention, Following commands, Memory, Safety/judgement, Awareness, Problem solving                 Orientation Level: Disoriented to, Place, Time, Situation Current Attention Level: Sustained Memory: Decreased short-term memory  Following Commands: Follows one step commands with increased time Safety/Judgement: Decreased awareness of safety Awareness: Intellectual Problem Solving: Slow processing, Decreased initiation, Requires verbal  cues General Comments: Increased time to process and follow commands. Patient often stating, "I will in a few minutes" to perform tasks. Patient able to state she was in [facility]        Exercises      Shoulder Instructions       General Comments      Pertinent Vitals/ Pain       Pain Assessment Pain Assessment: No/denies pain  Home Living                                          Prior Functioning/Environment              Frequency  Min 2X/week        Progress Toward Goals  OT Goals(current goals can now be found in the care plan section)  Progress towards OT goals: Progressing toward goals  Acute Rehab OT Goals Patient Stated Goal: none stated Time For Goal Achievement: 03/28/22 Potential to Achieve Goals: Fair ADL Goals Pt Will Perform Grooming: with modified independence;standing Pt Will Perform Upper Body Dressing: with modified independence;sitting Pt Will Perform Lower Body Dressing: with modified independence;sit to/from stand Pt Will Transfer to Toilet: with modified independence;ambulating Pt Will Perform Toileting - Clothing Manipulation and hygiene: with modified independence;sit to/from stand  Plan Discharge plan remains appropriate    Co-evaluation                 AM-PAC OT "6 Clicks" Daily Activity     Outcome Measure   Help from another person eating meals?: A Little Help from another person taking care of personal grooming?: A Little Help from another person toileting, which includes using toliet, bedpan, or urinal?: A Lot Help from another person bathing (including washing, rinsing, drying)?: A Lot Help from another person to put on and taking off regular upper body clothing?: A Little Help from another person to put on and taking off regular lower body clothing?: A Lot 6 Click Score: 15    End of Session    OT Visit Diagnosis: Unsteadiness on feet (R26.81);Other abnormalities of gait and mobility  (R26.89);Muscle weakness (generalized) (M62.81);Other symptoms and signs involving cognitive function   Activity Tolerance Patient tolerated treatment well   Patient Left in chair;with call bell/phone within reach;with chair alarm set   Nurse Communication Mobility status        Time: 1610-9604 OT Time Calculation (min): 24 min  Charges: OT General Charges $OT Visit: 1 Visit OT Treatments $Self Care/Home Management : 8-22 mins $Therapeutic Activity: 8-22 mins  Alfonse Flavors, OTA Acute Rehabilitation Services  Office [phone]   Dewain Penning 03/17/2022, 11:59 AM

## 2022-03-17 NOTE — TOC Transition Note (Addendum)
Transition of Care [facility]) - CM/SW Discharge Note   Patient Details  Name: Amy Casey MRN: [id number] Date of Birth: [birth year]  Transition of Care [facility]) CM/SW Contact:  Mearl Latin, LCSW Phone Number: 03/17/2022, 1:12 PM   Clinical Narrative:    Patient will DC to: [facility] ([facility] Anticipated DC date: 03/17/22 Family notified: Sister, [name] Transport by: Kathryne Sharper due to [facility]   Per MD patient ready for DC to [facility] BMU. RN to call report prior to discharge (([phone]). RN, patient, patient's family, and facility notified of DC. Discharge Summary and IVC sent to facility. Sheriff transport requested for patient.   CSW will sign off for now as social work intervention is no longer needed. Please consult [place] again if new needs arise.     Final next level of care: [facility] Barriers to Discharge: No Barriers Identified   Patient Goals and CMS Choice Patient states their goals for this hospitalization and ongoing recovery are:: to go home CMS Medicare.gov Compare Post Acute Care list provided to:: Other (Comment Required) Choice offered to / list presented to : Patient, Adult Children    Discharge Placement                Patient to be transferred to facility by: [facility] Name of family member notified: Sister Patient and family notified of of transfer: 03/17/22  Discharge Plan and Services                DME Arranged: N/A         HH Arranged: Refused HH          Social Determinants of Health (SDOH) Interventions     Readmission Risk Interventions     No data to display

## 2022-03-17 NOTE — TOC Progression Note (Addendum)
Transition of Care [facility]) - Progression Note    Patient Details  Name: Amy Casey MRN: [id number] Date of Birth: Nov 12, 1960  Transition of Care [facility]) CM/SW Contact  Mearl Latin, LCSW Phone Number: 03/17/2022, 9:15 AM  Clinical Narrative:    9am-Reached out to [facility] and [facility] for bed status.   9:45am-[facility] able to accept patient today pending COVID test. Patient will require transport via Sheriff since [employer]. CSW contacted Sheriff's office and gave them preliminary info. Will call them back once COVID returns.   Left vm for Haze Rushing.        Barriers to Discharge: No Barriers Identified  Expected Discharge Plan and Services                         DME Arranged: N/A         HH Arranged: Refused HH           Social Determinants of Health (SDOH) Interventions SDOH Screenings   Food Insecurity: No Food Insecurity (03/13/2022)  Housing: Low Risk  (03/13/2022)  Transportation Needs: No Transportation Needs (03/13/2022)  Utilities: Not At Risk (03/13/2022)  Alcohol Screen: Low Risk  (08/15/2018)  Depression (PHQ2-9): Low Risk  (05/25/2021)  Tobacco Use: Low Risk  (03/12/2022)    Readmission Risk Interventions     No data to display

## 2022-03-17 NOTE — Tx Team (Addendum)
Initial Treatment Plan 03/17/2022 5:57 PM Amy Casey [id number]    PATIENT STRESSORS: Health problems   Medication change or noncompliance     PATIENT STRENGTHS: Supportive family/friends    PATIENT IDENTIFIED PROBLEMS:   " I need help "  " Being in here "                 DISCHARGE CRITERIA:  Ability to meet basic life and health needs Adequate post-discharge living arrangements Improved stabilization in mood, thinking, and/or behavior  PRELIMINARY DISCHARGE PLAN: Attend aftercare/continuing care group Attend PHP/IOP Outpatient therapy  PATIENT/FAMILY INVOLVEMENT: This treatment plan has been presented to and reviewed with the patient, Amy Casey. She has been given the opportunity to ask questions.  Luane School, RN 03/17/2022, 5:57 PM

## 2022-03-17 NOTE — Progress Notes (Signed)
Physical Therapy Treatment Patient Details Name: Amy Casey MRN: [id number] DOB: 11/06/1960 Today's Date: 03/17/2022   History of Present Illness Patient is a 61 y/o female who presents on 12/16 from [facility] with catatonia, transferred to [facility] with concern for CVAS. Found to have delirium likely due to UTI and AKI. PMH includes depression, paranoid schizophrenia, HTN, obesity.    PT Comments    Pt is demonstrating improved balance and mobility from initial evaluation demonstrating the need for Min A. Pt is limited cognitively more than physically at this time with functional mobility. Pt demonstrates good strong sit to stand demonstrating the ability to navigate stairs with minimal assistance and ambulate longer distances. Pt has very low foot clearance at this time with a few shuffling steps but declined further today. Pt will benefit from continued skilled physical therapy services pending cognition on discharge from acute care hospital setting. Pt will need minimal physical assistance in next venue. Pt is not appropriate for assistive device at this time due to current cognitive status. Pt demonstrates no signs/symptoms of cardiac/respiratory distress throughout session today.     Recommendations for follow up therapy are one component of a multi-disciplinary discharge planning process, led by the attending physician.  Recommendations may be updated based on patient status, additional functional criteria and insurance authorization.  Follow Up Recommendations  Home health PT     Assistance Recommended at Discharge Frequent or constant Supervision/Assistance  Patient can return home with the following A little help with walking and/or transfers;Assistance with cooking/housework;Assist for transportation;Help with stairs or ramp for entrance   Equipment Recommendations  Rolling walker (2 wheels);Other (comment) (Pt will need training with any AD due to cognition)    Recommendations for Other  Services       Precautions / Restrictions Precautions Precautions: Fall Restrictions Weight Bearing Restrictions: No     Mobility  Bed Mobility                 Patient Response: Flat affect, Restless  Transfers Overall transfer level: Needs assistance Equipment used: 1 person hand held assist Transfers: Sit to/from Stand Sit to Stand: Min guard           General transfer comment: Pt has wide BOS when standing Min A for balance due to multidirectional sway but no significant LOB    Ambulation/Gait Ambulation/Gait assistance: Min assist Gait Distance (Feet): 3 Feet Assistive device: 1 person hand held assist Gait Pattern/deviations: Shuffle   Gait velocity interpretation: <1.31 ft/sec, indicative of household ambulator   General Gait Details: Pt has shuffling gait pattern for just a couple of steps with wide BOS at Min A       Balance Overall balance assessment: Needs assistance Sitting-balance support: Feet supported, No upper extremity supported Sitting balance-Leahy Scale: Good     Standing balance support: During functional activity, Single extremity supported Standing balance-Leahy Scale: Fair Standing balance comment: Min A for balance in standing without AD. Pt is hesitant of AD                            Cognition Arousal/Alertness: Awake/alert Behavior During Therapy: Flat affect                     Orientation Level: Disoriented to, Place, Time, Situation       Safety/Judgement: Decreased awareness of safety   Problem Solving: Slow processing, Decreased initiation, Requires verbal cues General Comments: Increased time to process  and follow commands.               Pertinent Vitals/Pain Pain Assessment Pain Assessment: No/denies pain     PT Goals (current goals can now be found in the care plan section) Acute Rehab PT Goals Patient Stated Goal: none stated, likely home PT Goal Formulation: Patient unable to  participate in goal setting Time For Goal Achievement: 03/28/22 Potential to Achieve Goals: Fair Progress towards PT goals: Progressing toward goals    Frequency    Min 3X/week      PT Plan Current plan remains appropriate       AM-PAC PT "6 Clicks" Mobility   Outcome Measure  Help needed turning from your back to your side while in a flat bed without using bedrails?: A Little Help needed moving from lying on your back to sitting on the side of a flat bed without using bedrails?: A Little Help needed moving to and from a bed to a chair (including a wheelchair)?: A Little Help needed standing up from a chair using your arms (e.g., wheelchair or bedside chair)?: A Little Help needed to walk in hospital room?: A Little Help needed climbing 3-5 steps with a railing? : A Little 6 Click Score: 18    End of Session Equipment Utilized During Treatment: Gait belt Activity Tolerance: Other (comment) (limited by cognition) Patient left: in chair;with chair alarm set;with call bell/phone within reach Nurse Communication: Mobility status PT Visit Diagnosis: Muscle weakness (generalized) (M62.81);Difficulty in walking, not elsewhere classified (R26.2);Unsteadiness on feet (R26.81)     Time: 6834-1962 PT Time Calculation (min) (ACUTE ONLY): 16 min  Charges:  $Therapeutic Activity: 8-22 mins                     Harrel Carina, DPT, CLT  Acute Rehabilitation Services Office: [phone] (Secure chat preferred)    Claudia Desanctis 03/17/2022, 1:59 PM

## 2022-03-17 NOTE — Progress Notes (Signed)
No overnight events, pt had restful night. She slept well, took her meds after second attempt. Ambulated to the bathroom twice.

## 2022-03-17 NOTE — Plan of Care (Signed)

## 2022-03-17 NOTE — Discharge Summary (Signed)
Amy Casey IEP:[id number] DOB: [birth year] DOA: 03/12/2022  PCP: Jac Canavan, PA-C  Admit date: 03/12/2022  Discharge date: 03/17/2022  Admitted From: Home   Disposition:  Psych Inpt   Recommendations for Outpatient Follow-up:   Follow up with PCP in 1-2 weeks  PCP Please obtain BMP/CBC, 2 view CXR in 1week,  (see Discharge instructions)   PCP Please follow up on the following pending results:    Home Health: None   Equipment/Devices: None  Consultations: Psych Discharge Condition: Stable    CODE STATUS: Full    Diet Recommendation: Heart Healthy     Chief Complaint  Patient presents with   Psychiatric Evaluation     Brief history of present illness from the day of admission and additional interim summary    61 y.o. female with medical history significant of depression/schizophrenia; HTN; HLD; and obesity presenting with catatonia. Her adult children are present and provided the history.  The patient had COVID about 5 weeks ago and has been feeling increasingly fatigued and somnolent since.  She may have stopped taking her medications along the way, and she has been so somnolent that she has not been eating and drinking as well recently.  She has had episodes of catatonia in the past and this is consistent with prior episodes                                                                  Hospital Course   Episodes of paranoia with refusal to take medications and lab draws.  Flat affect. -Patient with long-standing history of controlled schizophrenia with periodic bouts of catatonia along with paranoia, per family -Per patient's daughter she gets these episodes of paranoia once or twice a year and this is her typical behavior where she refuses to eat or drink, refuses to take medications, refuses  lab draws etc. -She has had negative UA, negative MRI, was little dehydrated and has been adequately hydrated with IV fluids,stable now.. -For management of psych issues to psychiatry.  Medically cleared for disposition on 03/15/2022, await inpatient psych bed.   AKI -Normal renal function as recently as a year ago  -Due to poor oral intake and dehydration hydrated with IV fluids, resolved.   Possible UTI - is post Rocephin x 3, follow cultures, UA not very impressive.   Schizophrenia -Psych medications defer to psychiatry team, psychiatry team has recommended inpatient psych admission, she is medically stable for discharge.  Await inpatient psych bed.   HTN -Placed on oral Lopressor added Norvasc for better control.  She is refusing medications frequently, will try our best to administer.   HLD - rosuvastatin   DM -diet controlled  Discharge diagnosis     Principal Problem:   Catatonia Active Problems:   Essential hypertension   Paranoid  schizophrenia (HCC)   AKI (acute kidney injury) (HCC)   Hyperlipidemia   Diabetes mellitus type 2 in obese [facility])   Acute metabolic encephalopathy    Discharge instructions    Discharge Instructions     Discharge instructions   Complete by: As directed    Follow with Primary MD Tysinger, Kermit Balo, PA-C and your psychiatrist in 7 days   Get CBC, CMP, 2 view Chest X ray -  checked next visit with your primary MD    Activity: As tolerated with Full fall precautions use walker/cane & assistance as needed  Disposition Home    Diet: Heart Healthy   Special Instructions: If you have smoked or chewed Tobacco  in the last 2 yrs please stop smoking, stop any regular Alcohol  and or any Recreational drug use.  On your next visit with your primary care physician please Get Medicines reviewed and adjusted.  Please request your Prim.MD to go over all Hospital Tests and Procedure/Radiological results at the follow up, please get all Hospital  records sent to your Prim MD by signing hospital release before you go home.  If you experience worsening of your admission symptoms, develop shortness of breath, life threatening emergency, suicidal or homicidal thoughts you must seek medical attention immediately by calling 911 or calling your MD immediately  if symptoms less severe.  You Must read complete instructions/literature along with all the possible adverse reactions/side effects for all the Medicines you take and that have been prescribed to you. Take any new Medicines after you have completely understood and accpet all the possible adverse reactions/side effects.   Increase activity slowly   Complete by: As directed        Discharge Medications   Allergies as of 03/17/2022       Reactions   Paliperidone Swelling, Other (See Comments)   Angioedema, drooling, slurred speech, ptosis   Lisinopril Swelling, Rash, Other (See Comments)   Angioedema, also   Sulfa Antibiotics Itching        Medication List     STOP taking these medications    FLUoxetine 20 MG capsule Commonly known as: PROZAC   pregabalin 75 MG capsule Commonly known as: LYRICA   traZODone 50 MG tablet Commonly known as: DESYREL   triamterene-hydrochlorothiazide 37.5-25 MG capsule Commonly known as: Dyazide       TAKE these medications    amLODipine 10 MG tablet Commonly known as: NORVASC TAKE 1 TABLET BY MOUTH ONCE DAILY. APPOINTMENT REQUIRED FOR FUTURE REFILLS. What changed:  how much to take how to take this when to take this additional instructions   metoprolol tartrate 50 MG tablet Commonly known as: LOPRESSOR Take 1 tablet (50 mg total) by mouth 2 (two) times daily.   OLANZapine 10 MG tablet Commonly known as: ZYPREXA Take 1 tablet (10 mg total) by mouth at bedtime.   rosuvastatin 10 MG tablet Commonly known as: Crestor Take 1 tablet (10 mg total) by mouth daily.   solifenacin 10 MG tablet Commonly known as:  VESICARE Take 1 tablet (10 mg total) by mouth daily.               Durable Medical Equipment  (From admission, onward)           Start     Ordered   03/15/22 1109  For home use only DME Walker rolling  Once       Comments: 5 wheel  Question Answer Comment  Walker: With 5 [employer]  Patient needs a walker to treat with the following condition Weakness      03/15/22 1108             Follow-up Information     Tysinger, Kermit Balo, PA-C. Schedule an appointment as soon as possible for a visit in 1 week(s).   Specialty: Family Medicine Why: Also follow-up with your psychiatrist within a week of discharge Contact information: [address] [place] [address] [phone]                 Major procedures and Radiology Reports - PLEASE review detailed and final reports thoroughly  -        ECHOCARDIOGRAM COMPLETE  Result Date: 03/13/2022    ECHOCARDIOGRAM REPORT   Patient Name:   CEAIRA ERNSTER Date of Exam: 03/13/2022 Medical Rec #:  [id number]       Height:       70.0 in Accession #:    [id number]      Weight:       207.0 lb Date of Birth:  [birth year]       BSA:          2.118 m Patient Age:    61 years        BP:           124/79 mmHg Patient Gender: F               HR:           108 bpm. Exam Location:  Inpatient Procedure: 2D Echo Indications:    acute diastolic chf  History:        Patient has no prior history of Echocardiogram examinations.                 Signs/Symptoms:Altered Mental Status; Risk Factors:Diabetes,                 Hypertension and Dyslipidemia.  Sonographer:    Delcie Roch RDCS Referring Phys: Heide Scales [facility]  Sonographer Comments: Technically difficult study due to poor echo windows. Image acquisition challenging due to respiratory motion. IMPRESSIONS  1. Left ventricular ejection fraction, by estimation, is 50 to 55%. The left ventricle has low normal function. Left ventricular endocardial border not optimally defined  to evaluate regional wall motion. There is mild left ventricular hypertrophy. Left ventricular diastolic parameters are consistent with Grade I diastolic dysfunction (impaired relaxation).  2. Right ventricular systolic function is normal. The right ventricular size is normal. There is normal pulmonary artery systolic pressure.  3. The mitral valve is grossly normal. No evidence of mitral valve regurgitation. No evidence of mitral stenosis.  4. The aortic valve was not well visualized. Aortic valve regurgitation is not visualized. No aortic stenosis is present.  5. The inferior vena cava is normal in size with greater than 50% respiratory variability, suggesting right atrial pressure of 3 mmHg.  6. Cannot exclude a small PFO. Comparison(s): No prior Echocardiogram. FINDINGS  Left Ventricle: Left ventricular ejection fraction, by estimation, is 50 to 55%. The left ventricle has low normal function. Left ventricular endocardial border not optimally defined to evaluate regional wall motion. The left ventricular internal cavity  size was normal in size. There is mild left ventricular hypertrophy. Left ventricular diastolic parameters are consistent with Grade I diastolic dysfunction (impaired relaxation). Right Ventricle: The right ventricular size is normal. No increase in right ventricular wall thickness. Right ventricular systolic function is normal. There is normal pulmonary artery systolic pressure. The  tricuspid regurgitant velocity is 1.93 m/s, and  with an assumed right atrial pressure of 3 mmHg, the estimated right ventricular systolic pressure is 17.9 mmHg. Left Atrium: Left atrial size was normal in size. Right Atrium: Right atrial size was normal in size. Pericardium: There is no evidence of pericardial effusion. Mitral Valve: The mitral valve is grossly normal. No evidence of mitral valve regurgitation. No evidence of mitral valve stenosis. Tricuspid Valve: The tricuspid valve is normal in structure.  Tricuspid valve regurgitation is not demonstrated. No evidence of tricuspid stenosis. Aortic Valve: The aortic valve was not well visualized. Aortic valve regurgitation is not visualized. No aortic stenosis is present. Pulmonic Valve: The pulmonic valve was not well visualized. Pulmonic valve regurgitation is not visualized. No evidence of pulmonic stenosis. Aorta: The aortic root and ascending aorta are structurally normal, with no evidence of dilitation. Venous: The inferior vena cava is normal in size with greater than 50% respiratory variability, suggesting right atrial pressure of 3 mmHg. IAS/Shunts: Cannot exclude a small PFO.  LEFT VENTRICLE PLAX 2D LVIDd:         4.80 cm     Diastology LVIDs:         3.70 cm     LV e' medial:    8.05 cm/s LV PW:         1.20 cm     LV E/e' medial:  8.3 LV IVS:        1.00 cm     LV e' lateral:   9.79 cm/s LVOT diam:     2.20 cm     LV E/e' lateral: 6.8 LV SV:         54 LV SV Index:   25 LVOT Area:     3.80 cm  LV Volumes (MOD) LV vol d, MOD A2C: 51.2 ml LV vol d, MOD A4C: 57.5 ml LV SV MOD A4C:     57.5 ml RIGHT VENTRICLE             IVC RV Basal diam:  2.60 cm     IVC diam: 1.10 cm RV S prime:     20.30 cm/s TAPSE (M-mode): 1.6 cm LEFT ATRIUM           Index        RIGHT ATRIUM           Index LA diam:      3.30 cm 1.56 cm/m   RA Area:     14.50 cm LA Vol (A4C): 36.6 ml 17.28 ml/m  RA Volume:   35.90 ml  16.95 ml/m  AORTIC VALVE LVOT Vmax:   110.00 cm/s LVOT Vmean:  69.000 cm/s LVOT VTI:    0.142 m  AORTA Ao Root diam: 3.10 cm Ao Asc diam:  3.60 cm MV E velocity: 67.00 cm/s   TRICUSPID VALVE MV A velocity: 118.00 cm/s  TR Peak grad:   14.9 mmHg MV E/A ratio:  0.57         TR Vmax:        193.00 cm/s                              SHUNTS                             Systemic VTI:  0.14 m  Systemic Diam: 2.20 cm Riley Lam MD Electronically signed by Riley Lam MD Signature Date/Time: 03/13/2022/6:33:05 PM    Final    MR  BRAIN WO CONTRAST  Result Date: 03/12/2022 CLINICAL DATA:  62 year old female with altered mental status. Questionable right PCA territory edema on plain head CT today. History of schizophrenia. EXAM: MRI HEAD WITHOUT CONTRAST TECHNIQUE: Multiplanar, multiecho pulse sequences of the brain and surrounding structures were obtained without intravenous contrast. COMPARISON:  Plain head CT 1118 hours.  Brain MRI 02/21/2014. FINDINGS: Brain: No restricted diffusion to suggest acute infarction. No midline shift, mass effect, evidence of mass lesion, ventriculomegaly, extra-axial collection or acute intracranial hemorrhage. Cervicomedullary junction and pituitary are within normal limits. No evidence of acute right occipital lobe edema. But progressed chronic widespread bilateral cerebral white matter T2 and FLAIR signal hyperintensity. And now similar signal heterogeneity in the bilateral deep gray nuclei and pons (series 5 images 8 in 14). No cortical encephalomalacia identified. Solitary chronic microhemorrhage in the left periventricular white matter on series 7, image 65. Cerebellum appears to remain normal. Vascular: Major intracranial vascular flow voids are stable since 2015. Skull and upper cervical spine: Negative for age visible cervical spine. Visualized bone marrow signal is within normal limits. Sinuses/Orbits: Negative orbits. Trace paranasal sinus mucosal thickening. Other: Mastoids are clear. Visible internal auditory structures appear normal. Negative visible scalp and face. IMPRESSION: 1. No acute intracranial abnormality. The right PCA/occipital appearance on CT today was Artifact. 2. But advanced signal abnormality in the bilateral cerebral white matter, deep gray nuclei, and pons - progressed since a 2015 MRI. This is nonspecific but may be related to chronic small vessel disease. Electronically Signed   By: Odessa Fleming M.D.   On: 03/12/2022 14:09   CT HEAD WO CONTRAST ( )  Result Date:  03/12/2022 CLINICAL DATA:  61 year old female altered mental status. EXAM: CT HEAD WITHOUT CONTRAST TECHNIQUE: Contiguous axial images were obtained from the base of the skull through the vertex without intravenous contrast. RADIATION DOSE REDUCTION: This exam was performed according to the departmental dose-optimization program which includes automated exposure control, adjustment of the mA and/or kV according to patient size and/or use of iterative reconstruction technique. COMPARISON:  Brain MRI 02/21/2014.  Head CT 07/30/2018. FINDINGS: Brain: Stable cerebral volume since 2020. No midline shift, ventriculomegaly, mass effect, evidence of mass lesion, intracranial hemorrhage. Patchy and confluent bilateral cerebral white matter hypodensity appears more pronounced in the right hemisphere and mildly progressed since 2020. Real versus artifactual confluent hypodensity in the right occipital lobe today on series 2, image 11. Legrand Rams this is artifact. No other acute cortically based infarct identified. Vascular: Calcified atherosclerosis at the skull base. Skull: No acute osseous abnormality identified. Sinuses/Orbits: Visualized paranasal sinuses and mastoids are stable and well aerated. Other: No acute orbit or scalp soft tissue finding. Small chronic right frontal scalp benign lipoma. IMPRESSION: 1. Suggestion of Right PCA/occipital lobe infarct with cytotoxic edema could be artifact. No associated hemorrhage or mass effect. Query left vision changes. Noncontrast Brain MRI would be confirmatory. 2. Otherwise advanced chronic cerebral white matter disease with some progression Electronically Signed   By: Odessa Fleming M.D.   On: 03/12/2022 11:36   DG Chest Portable 1 View  Result Date: 03/12/2022 CLINICAL DATA:  Altered mental status EXAM: PORTABLE CHEST 1 VIEW COMPARISON:  04/21/2019 FINDINGS: Transverse diameter of heart is increased. Thoracic aorta is tortuous and ectatic. Lung fields are clear of any infiltrates  or pulmonary edema. There is no pleural effusion or  pneumothorax. IMPRESSION: Cardiomegaly. There are no signs of pulmonary edema or focal pulmonary consolidation. Electronically Signed   By: Ernie Avena M.D.   On: 03/12/2022 11:25    Micro Results    Recent Results (from the past 240 hour(s))  Resp panel by RT-PCR (RSV, Flu A&B, Covid) Anterior Nasal Swab     Status: None   Collection Time: 03/11/22  4:39 PM   Specimen: Anterior Nasal Swab  Result Value Ref Range Status   SARS Coronavirus 2 by RT PCR NEGATIVE NEGATIVE Final    Comment: (NOTE) SARS-CoV-2 target nucleic acids are NOT DETECTED.  The SARS-CoV-2 RNA is generally detectable in upper respiratory specimens during the acute phase of infection. The lowest concentration of SARS-CoV-2 viral copies this assay can detect is 138 copies/mL. A negative result does not preclude SARS-Cov-2 infection and should not be used as the sole basis for treatment or other patient management decisions. A negative result may occur with  improper specimen collection/handling, submission of specimen other than nasopharyngeal swab, presence of viral mutation(s) within the areas targeted by this assay, and inadequate number of viral copies(<138 copies/mL). A negative result must be combined with clinical observations, patient history, and epidemiological information. The expected result is Negative.  Fact Sheet for Patients:  BloggerCourse.com  Fact Sheet for Healthcare Providers:  SeriousBroker.it  This test is no t yet approved or cleared by the [place] FDA and  has been authorized for detection and/or diagnosis of SARS-CoV-2 by FDA under an Emergency Use Authorization (EUA). This EUA will remain  in effect (meaning this test can be used) for the duration of the COVID-19 declaration under Section 564(b)(1) of the Act, 21 U.S.C.section 360bbb-3(b)(1), unless the authorization is  terminated  or revoked sooner.       Influenza A by PCR NEGATIVE NEGATIVE Final   Influenza B by PCR NEGATIVE NEGATIVE Final    Comment: (NOTE) The Xpert Xpress SARS-CoV-2/FLU/RSV plus assay is intended as an aid in the diagnosis of influenza from Nasopharyngeal swab specimens and should not be used as a sole basis for treatment. Nasal washings and aspirates are unacceptable for Xpert Xpress SARS-CoV-2/FLU/RSV testing.  Fact Sheet for Patients: BloggerCourse.com  Fact Sheet for Healthcare Providers: SeriousBroker.it  This test is not yet approved or cleared by the [place] FDA and has been authorized for detection and/or diagnosis of SARS-CoV-2 by FDA under an Emergency Use Authorization (EUA). This EUA will remain in effect (meaning this test can be used) for the duration of the COVID-19 declaration under Section 564(b)(1) of the Act, 21 U.S.C. section 360bbb-3(b)(1), unless the authorization is terminated or revoked.     Resp Syncytial Virus by PCR NEGATIVE NEGATIVE Final    Comment: (NOTE) Fact Sheet for Patients: BloggerCourse.com  Fact Sheet for Healthcare Providers: SeriousBroker.it  This test is not yet approved or cleared by the [place] FDA and has been authorized for detection and/or diagnosis of SARS-CoV-2 by FDA under an Emergency Use Authorization (EUA). This EUA will remain in effect (meaning this test can be used) for the duration of the COVID-19 declaration under Section 564(b)(1) of the Act, 21 U.S.C. section 360bbb-3(b)(1), unless the authorization is terminated or revoked.  Performed at [facility], [address], [place], [place] [address]   Resp panel by RT-PCR (RSV, Flu A&B, Covid) Anterior Nasal Swab     Status: None   Collection Time: 03/12/22 11:28 AM   Specimen: Anterior Nasal Swab  Result Value Ref Range Status  SARS  Coronavirus 2 by RT PCR NEGATIVE NEGATIVE Final    Comment: (NOTE) SARS-CoV-2 target nucleic acids are NOT DETECTED.  The SARS-CoV-2 RNA is generally detectable in upper respiratory specimens during the acute phase of infection. The lowest concentration of SARS-CoV-2 viral copies this assay can detect is 138 copies/mL. A negative result does not preclude SARS-Cov-2 infection and should not be used as the sole basis for treatment or other patient management decisions. A negative result may occur with  improper specimen collection/handling, submission of specimen other than nasopharyngeal swab, presence of viral mutation(s) within the areas targeted by this assay, and inadequate number of viral copies(<138 copies/mL). A negative result must be combined with clinical observations, patient history, and epidemiological information. The expected result is Negative.  Fact Sheet for Patients:  BloggerCourse.com  Fact Sheet for Healthcare Providers:  SeriousBroker.it  This test is no t yet approved or cleared by the [place] FDA and  has been authorized for detection and/or diagnosis of SARS-CoV-2 by FDA under an Emergency Use Authorization (EUA). This EUA will remain  in effect (meaning this test can be used) for the duration of the COVID-19 declaration under Section 564(b)(1) of the Act, 21 U.S.C.section 360bbb-3(b)(1), unless the authorization is terminated  or revoked sooner.       Influenza A by PCR NEGATIVE NEGATIVE Final   Influenza B by PCR NEGATIVE NEGATIVE Final    Comment: (NOTE) The Xpert Xpress SARS-CoV-2/FLU/RSV plus assay is intended as an aid in the diagnosis of influenza from Nasopharyngeal swab specimens and should not be used as a sole basis for treatment. Nasal washings and aspirates are unacceptable for Xpert Xpress SARS-CoV-2/FLU/RSV testing.  Fact Sheet for  Patients: BloggerCourse.com  Fact Sheet for Healthcare Providers: SeriousBroker.it  This test is not yet approved or cleared by the [place] FDA and has been authorized for detection and/or diagnosis of SARS-CoV-2 by FDA under an Emergency Use Authorization (EUA). This EUA will remain in effect (meaning this test can be used) for the duration of the COVID-19 declaration under Section 564(b)(1) of the Act, 21 U.S.C. section 360bbb-3(b)(1), unless the authorization is terminated or revoked.     Resp Syncytial Virus by PCR NEGATIVE NEGATIVE Final    Comment: (NOTE) Fact Sheet for Patients: BloggerCourse.com  Fact Sheet for Healthcare Providers: SeriousBroker.it  This test is not yet approved or cleared by the [place] FDA and has been authorized for detection and/or diagnosis of SARS-CoV-2 by FDA under an Emergency Use Authorization (EUA). This EUA will remain in effect (meaning this test can be used) for the duration of the COVID-19 declaration under Section 564(b)(1) of the Act, 21 U.S.C. section 360bbb-3(b)(1), unless the authorization is terminated or revoked.  Performed at [facility], [address] [place], [place] [address]   Blood culture (routine x 2)     Status: None   Collection Time: 03/12/22  2:36 PM   Specimen: BLOOD RIGHT FOREARM  Result Value Ref Range Status   Specimen Description BLOOD RIGHT FOREARM  Final   Special Requests   Final    BOTTLES DRAWN AEROBIC AND ANAEROBIC Blood Culture adequate volume   Culture   Final    NO GROWTH 5 DAYS Performed at [facility], [address], [place], [place] [address]    Report Status 03/17/2022 FINAL  Final  Urine Culture     Status: None   Collection Time: 03/13/22  2:28 AM   Specimen: Urine, Clean Catch  Result Value Ref Range Status   Specimen Description URINE, CLEAN CATCH  Final    Special Requests NONE  Final   Culture   Final    NO GROWTH Performed at [facility], [address] [place], [place] [address]    Report Status 03/14/2022 FINAL  Final  Resp panel by RT-PCR (RSV, Flu A&B, Covid) Anterior Nasal Swab     Status: None   Collection Time: 03/17/22  9:28 AM   Specimen: Anterior Nasal Swab  Result Value Ref Range Status   SARS Coronavirus 2 by RT PCR NEGATIVE NEGATIVE Final    Comment: (NOTE) SARS-CoV-2 target nucleic acids are NOT DETECTED.  The SARS-CoV-2 RNA is generally detectable in upper respiratory specimens during the acute phase of infection. The lowest concentration of SARS-CoV-2 viral copies this assay can detect is 138 copies/mL. A negative result does not preclude SARS-Cov-2 infection and should not be used as the sole basis for treatment or other patient management decisions. A negative result may occur with  improper specimen collection/handling, submission of specimen other than nasopharyngeal swab, presence of viral mutation(s) within the areas targeted by this assay, and inadequate number of viral copies(<138 copies/mL). A negative result must be combined with clinical observations, patient history, and epidemiological information. The expected result is Negative.  Fact Sheet for Patients:  BloggerCourse.comhttps://www.fda.gov/media/152166/download  Fact Sheet for Healthcare Providers:  SeriousBroker.ithttps://www.fda.gov/media/152162/download  This test is no t yet approved or cleared by the [place] FDA and  has been authorized for detection and/or diagnosis of SARS-CoV-2 by FDA under an Emergency Use Authorization (EUA). This EUA will remain  in effect (meaning this test can be used) for the duration of the COVID-19 declaration under Section 564(b)(1) of the Act, 21 U.S.C.section 360bbb-3(b)(1), unless the authorization is terminated  or revoked sooner.       Influenza A by PCR NEGATIVE NEGATIVE Final   Influenza B by PCR NEGATIVE NEGATIVE  Final    Comment: (NOTE) The Xpert Xpress SARS-CoV-2/FLU/RSV plus assay is intended as an aid in the diagnosis of influenza from Nasopharyngeal swab specimens and should not be used as a sole basis for treatment. Nasal washings and aspirates are unacceptable for Xpert Xpress SARS-CoV-2/FLU/RSV testing.  Fact Sheet for Patients: BloggerCourse.comhttps://www.fda.gov/media/152166/download  Fact Sheet for Healthcare Providers: SeriousBroker.ithttps://www.fda.gov/media/152162/download  This test is not yet approved or cleared by the [place] FDA and has been authorized for detection and/or diagnosis of SARS-CoV-2 by FDA under an Emergency Use Authorization (EUA). This EUA will remain in effect (meaning this test can be used) for the duration of the COVID-19 declaration under Section 564(b)(1) of the Act, 21 U.S.C. section 360bbb-3(b)(1), unless the authorization is terminated or revoked.     Resp Syncytial Virus by PCR NEGATIVE NEGATIVE Final    Comment: (NOTE) Fact Sheet for Patients: BloggerCourse.comhttps://www.fda.gov/media/152166/download  Fact Sheet for Healthcare Providers: SeriousBroker.ithttps://www.fda.gov/media/152162/download  This test is not yet approved or cleared by the [place] FDA and has been authorized for detection and/or diagnosis of SARS-CoV-2 by FDA under an Emergency Use Authorization (EUA). This EUA will remain in effect (meaning this test can be used) for the duration of the COVID-19 declaration under Section 564(b)(1) of the Act, 21 U.S.C. section 360bbb-3(b)(1), unless the authorization is terminated or revoked.  Performed at [facility], [address] [place], [place] [id number]     Today   Subjective   In bed in no distress comfortable, will not answer questions reliably, mumbling words off-and-on.  Objective   Blood pressure 114/71, pulse 92, temperature 98.2 F (36.8 C), temperature source Oral, resp. rate 17, height 5\' 10"  (1.778 m), weight 87.8 kg, SpO2 100  %.   Intake/Output Summary (Last 24 hours) at 03/17/2022 1305 Last data filed at 03/16/2022 1800 Gross per 24 hour  Intake 120 ml  Output --  Net 120 ml    Exam  Awake, No new F.N deficits, flat affect [place].AT,PERRAL Supple Neck, No JVD,   Symmetrical Chest wall movement, Good air movement bilaterally, CTAB RRR,No Gallops, Rubs or new Murmurs,  +ve B.Sounds, Abd Soft, No tenderness,   No Cyanosis, Clubbing or edema    Data Review   Recent Labs  Lab 03/12/22 1128 03/13/22 0434 03/15/22 0514  WBC 10.8* 7.5 8.1  HGB 14.5 12.3 12.3  HCT 44.7 39.4 37.9  PLT 350 349 312  MCV 90.9 91.8 89.6  MCH 29.5 28.7 29.1  MCHC 32.4 31.2 32.5  RDW 13.6 13.7 13.3  LYMPHSABS 1.6  --  2.2  MONOABS 0.9  --  0.7  EOSABS 0.4  --  0.5  BASOSABS 0.1  --  0.0    Recent Labs  Lab 03/12/22 1128 03/13/22 0434 03/14/22 1417 03/15/22 0514  NA 142 145 145 145  K 5.5* 3.6 3.3* 3.6  CL 106 106 109 113*  CO2 24 28 28 24   ANIONGAP 12 11 8 8   GLUCOSE 112* 102* 167* 113*  BUN 95* 73* 41* 29*  CREATININE 2.19* 1.80* 1.34* 1.21*  AST 40  --   --   --   ALT 26  --   --   --   ALKPHOS 83  --   --   --   BILITOT 1.3*  --   --   --   ALBUMIN 4.3  --   --   --   BNP  --  13.1  --  70.0  MG  --  2.4 2.0 1.8  CALCIUM 9.5 9.2 9.0 9.1     Total Time in preparing paper work, data evaluation and todays exam - 35 minutes  Signature  -    03/17/22 M.D on 03/17/2022 at 1:05 PM   -  To page go to [email or web address]

## 2022-03-17 NOTE — Progress Notes (Signed)
Per nursing judgment of this RN and the 2nd RN on the unit, patient placed on 1:1 monitoring for safety.  Patient is disoriented to situation, time, and place, only oriented to self.  Patient also refuses all treatment and care including CBG checks, also refusing food, water, or any other nutrition and medications.  Patient refused to get up from the dayroom to go into her room.  It took a 20 minute conversation by this RN and the 2nd RN to redirect the patient to her room.  Patient complained of pain in her legs/feet while walking the short distance from the day room to room 37.  Patient also exhibiting unsteady and unbalanced gait.  MD and on-call provider notified of the situation and per MD order, patient put on 1:1 for safety.  [facility] AC and  AC called to see if any staff can be provided for the patient.  Both stated that there is no staff available at this time.

## 2022-03-17 NOTE — Progress Notes (Addendum)
Patient is a 61 year old female admitted involuntarily to the [facility] floor from [facility] PCU floor with a diagnosis of schizophrenia at approx 1545. Patient is lethargic and oriented x 2 at best. She presents with extreme confusion. She is a poor historian and the majority of information obtained via the chart. "I don't know. I don't know. I am messed up." Patient speaks in a soft voice and often repeats the questions asked without actually answering the question. She is exhibiting odd behavior. Patient has a psych history of paranoid schizophrenia, medication noncompliance, and mood disorder. She has been prescribed Prozac, olanzapine, and trazodone in the past but has not been on a routine regimen in quite some time. She denies SI/HI/AVH. Patient initially presented to the ED with her husband with complaints of hallucinations, poor sleep, and medication noncompliance. Patient lives with her husband, 2 grandkids, and mother in [place]. Patients states that "getting out of here" is her goal. Patient has DM and will be on blood sugar checks with meals with sliding scale and a long acting insulin at bedtime.  Skin assessment and body search completed with [facility]. Skin: healed scab on L elbow, bump on R forehead, and large flat mole on L forehead.  No contrabands found. Consents signed with abbreviations. Patient given the opportunity to ask questions and express concerns.

## 2022-03-18 DIAGNOSIS — F203 Undifferentiated schizophrenia: Secondary | ICD-10-CM | POA: Diagnosis not present

## 2022-03-18 LAB — GLUCOSE, CAPILLARY
Glucose-Capillary: 122 mg/dL — ABNORMAL HIGH (ref 70–99)
Glucose-Capillary: 120 mg/dL — ABNORMAL HIGH (ref 70–99)
Glucose-Capillary: 111 mg/dL — ABNORMAL HIGH (ref 70–99)
Glucose-Capillary: 178 mg/dL — ABNORMAL HIGH (ref 70–99)

## 2022-03-18 LAB — LIPID PANEL
Cholesterol: 172 mg/dL (ref 0–200)
HDL: 30 mg/dL — ABNORMAL LOW (ref >40)
LDL Cholesterol: 120 mg/dL — ABNORMAL HIGH (ref 0–99)
Total CHOL/HDL Ratio: 5.7 RATIO
Triglycerides: 111 mg/dL (ref <150)
VLDL: 22 mg/dL (ref 0–40)

## 2022-03-18 MED ORDER — OLANZAPINE 5 MG PO TBDP
5.0000 mg | ORAL_TABLET | ORAL | Status: DC
  Administered 2022-03-18 – 2022-03-21 (×6): 5 mg via ORAL
  Filled 2022-03-18 (×6): qty 1

## 2022-03-18 MED ORDER — LORAZEPAM 0.5 MG PO TABS
0.5000 mg | ORAL_TABLET | ORAL | Status: DC
  Administered 2022-03-18 – 2022-03-28 (×20): 0.5 mg via ORAL
  Filled 2022-03-18 (×20): qty 1

## 2022-03-18 MED ORDER — OLANZAPINE 10 MG IM SOLR: 5.0000 mg | INTRAMUSCULAR | Status: DC

## 2022-03-18 NOTE — Progress Notes (Addendum)
1800: Patient in dayroom appears more alert and more talkative less paranoid. Patient got up to use bathroom on her own. Patient able to hold conversation and improvement noted in verbalizing needs and thoughts. Patient stated "I was feeling down earlier but I am feeling better now." "I went to the bathroom and I think I can eat more now." Patient with sitter by side. Calm and cooperative at this moment and talking to her sister on the phone per patient request.    1824: Patient ate nutri-grain bar and had 1 cup of water.

## 2022-03-18 NOTE — Progress Notes (Addendum)
1 to 1 note:  Patient observed in room, sitting in chair, calm at this time with sitter by side.   3570: Patient allowed CBG to be taken. Insulin provided per sliding scale. After providing explanation and much encouragement, patient compliant with insulin.   0900: Patient ambulated to dayroom guided by RN and Nurse tech. Patient stated "I am able to walk." Patient paused for a moment and kept stating "she's coming after me." Patient reassured she was safe on unit. Patient decided what chair to sit in and remains 1:1 for safety.  1200pm: Patient refused to eat any breakfast or drink her ensure. Patient stated "I don't need it." Patient not providing any reason for her not eating. With much encouragement, patient is med compliant with scheduled po meds. Patient also provided with water and encouraged to drink fluids. Sitter by side.  1430: Patient urinated over herself in dayroom area despite providing time in bathroom earlier. Patient guided out of dayroom and into bathroom. Patient showered, independent after set up, and changed into clean scrubs. Soiled area cleaned up and EVS contacted to mop floor.   1500: Patient back in dayroom area sitting in chair calmly watching tv with sitter by side.   1530: Patient observed mumbling/talking to herself and looking around the dayroom cautiously.   1600: Patient compliant with afternoon po medications after much explanation/encouragement. Patient more talkative at this time but still disorganized.   1700: patient ate all her of potato along with a few more bites of food and took a few sips of water with encouragement. Patient stated she was worried because she thought she had to pay for the food. Patient made aware that she did not have to worry about the cost of food. Will continue to encourage patient to eat/drink fluids throughout shift. Sitter remains by side.

## 2022-03-18 NOTE — Plan of Care (Signed)
Pt received at 1900 sitting in day room, pt appears fearful, responding to internal stimuli on approach pt guarded, paranoid, preoccupied, continuous self dialogue, disorganized, incoherent speech. Pt avoids interaction with staff, allowed one staff member to take vitals, was grunting/talking unable to take redirection. Pt continued to refused food/fluids/care/medications. This Clinical research associate and another RN spent time attempting to re-assure pt that pt is in a hospital and safe. Pt reluctantly stood up after this writer opened door to room pt could see into from day room. Pt has an unsteady gait, pt stated that pt had pain in feet when ambulating. Pt was speaking out loud, however was not directing communication at staff, rather self dialogue included statements about pain. Pt did not answer questions asked by this Clinical research associate. Pt answers no to all questions by staff. Pt had difficulty walking to room, pt sat down in chair in room and remained there all night. Pt declined any offer for blanket, bathroom or to get into bed.  ARNP Nanine Means contacted, made aware of situation, secure chat sent to entire treatment team. MD responded with orders for 1:1 sitter and PRN medications.   Pt did not sleep overnight, pt remained awake all night sitting in a chair in room.  Problem: Education: Goal: Knowledge of General Education information will improve Description: Including pain rating scale, medication(s)/side effects and non-pharmacologic comfort measures Outcome: Not Progressing   Problem: Health Behavior/Discharge Planning: Goal: Ability to manage health-related needs will improve Outcome: Not Progressing   Problem: Clinical Measurements: Goal: Ability to maintain clinical measurements within normal limits will improve Outcome: Not Progressing Goal: Will remain free from infection Outcome: Not Progressing Goal: Diagnostic test results will improve Outcome: Not Progressing Goal: Respiratory complications will  improve Outcome: Not Progressing Goal: Cardiovascular complication will be avoided Outcome: Not Progressing   Problem: Activity: Goal: Risk for activity intolerance will decrease Outcome: Not Progressing   Problem: Nutrition: Goal: Adequate nutrition will be maintained Outcome: Not Progressing   Problem: Coping: Goal: Level of anxiety will decrease Outcome: Not Progressing   Problem: Elimination: Goal: Will not experience complications related to bowel motility Outcome: Not Progressing Goal: Will not experience complications related to urinary retention Outcome: Not Progressing   Problem: Pain Managment: Goal: General experience of comfort will improve Outcome: Not Progressing   Problem: Safety: Goal: Ability to remain free from injury will improve Outcome: Not Progressing   Problem: Skin Integrity: Goal: Risk for impaired skin integrity will decrease Outcome: Not Progressing

## 2022-03-18 NOTE — Progress Notes (Addendum)
NUTRITION ASSESSMENT  Pt identified as at risk on the Malnutrition Screen Tool  INTERVENTION:  -Liberalize diet to regular for wider variety of meal selections -Continue Ensure Enlive po BID, each supplement provides 350 kcal and 20 grams of protein.  -Magic cup TID with meals, each supplement provides 290 kcal and 9 grams of protein  -MVI with minerals daily  NUTRITION DIAGNOSIS: Unintentional weight loss related to sub-optimal intake as evidenced by pt report.   Goal: Pt to meet >/= 90% of their estimated nutrition needs.  Monitor:  PO intake  Assessment:  Pt under IVC with diagnosis of schizophrenia.   Per previous RD notes, pt with poor oral intake one day PTA during hospital admission, but generally eats well. Noted per RD notes that pt eats better when friends or family are present.   Pt currently on a carb modified diet.   Reviewed wt hx; pt has experienced a 26.8% wt loss over the past 6 months, which is significant for time frame.   While pt has history of DM, it is well controlled. Pt with poor oral intake and would benefit from nutrient dense supplement. One Ensure Enlive supplement provides 350 kcals, 20 grams protein, and 44-45 grams of carbohydrate vs one Glucerna shake supplement, which provides 220 kcals, 10 grams of protein, and 26 grams of carbohydrate. Given pt's hx of DM, RD will reassess adequacy of PO intake, CBGS, and adjust supplement regimen as appropriate at follow-up.    Per RN notes, pt avoids interaction with staff and has been refusing food, fluids, and medications.   Lab Results  Component Value Date   HGBA1C 6.3 (A) 09/07/2021   PTA DM medications are .none   Labs reviewed: CBGS: 98-122 (inpatient orders for glycemic control are 0-5 units insulin aspart daily at bedtime and 0-15 units insulin aspart TID with meals).    61 y.o. female  Height: Ht Readings from Last 1 Encounters:  03/17/22 5' 10.5" (1.791 m)    Weight: Wt Readings from  Last 1 Encounters:  03/17/22 75 kg    Weight Hx: Wt Readings from Last 10 Encounters:  03/17/22 75 kg  03/13/22 87.8 kg  03/04/22 93.9 kg  09/07/21 102.4 kg  05/25/21 100.2 kg  04/29/21 94.8 kg  03/15/21 92.5 kg  02/23/21 91.4 kg  01/26/21 83.8 kg  01/06/21 83.5 kg    BMI:  Body mass index is 23.39 kg/m. BMI WDL  Estimated Nutritional Needs: Kcal: 25-30 kcal/kg Protein: > 1 gram protein/kg Fluid: 1 ml/kcal  Diet Order:  Diet Order             Diet Carb Modified Fluid consistency: Thin; Room service appropriate? Yes  Diet effective now                  Pt is also offered choice of unit snacks mid-morning and mid-afternoon.  Pt is eating as desired.   Lab results and medications reviewed.   Levada Schilling, RD, LDN, CDCES Registered Dietitian II Certified Diabetes Care and Education Specialist Please refer to [facility] for RD and/or RD on-call/weekend/after hours pager

## 2022-03-18 NOTE — Progress Notes (Signed)
[facility] LCSW Progress Note   03/18/2022    10:12 AM  Amy Casey   [id number]   Type of Contact and Topic:  PSA Attempt #1   CSW attempted to completed PSA with patient. Patient attempted to respond, however, remarks were unintelligible. CSW team to make additional attempts to completed PSA with patient.    Signed:  Corky Crafts, MSW, LCSWA, LCAS 03/18/2022 10:12 AM

## 2022-03-18 NOTE — Plan of Care (Deleted)
  Problem: Safety: Goal: Ability to remain free from injury will improve Outcome: Progressing   

## 2022-03-18 NOTE — Progress Notes (Signed)
[facility] Second Physician Opinion Progress Note for Medication Administration to Non-consenting Patients (For Involuntarily Committed Patients)  Patient: Amy Casey Date of Birth: [id number] MRN: [id number]  Reason for the Medication: The patient, without the benefit of the specific treatment measure, is incapable of participating in any available treatment plan that will give the patient a realistic opportunity of improving the patient's condition. There is, without the benefit of the specific treatment measure, a significant possibility that the patient will harm self or others before improvement of the patient's condition is realized.  Consideration of Side Effects: Consideration of the side effects related to the medication plan has been given.  Rationale for Medication Administration: Patient with psychotic disorder and history of psychosis presents with failure to eat or take medicine or take care of herself.  No insight.  Refusing medication.  Likely to get sicker without medication to the point of harming herself.  Unlikely to improve without medication.    Mordecai Rasmussen, MD 03/18/22  9:54 AM   This documentation is good for (7) seven days from the date of the MD signature. New documentation must be completed every seven (7) days with detailed justification in the medical record if the patient requires continued non-emergent administration of psychotropic medications.

## 2022-03-18 NOTE — Plan of Care (Signed)
  Problem: Safety: Goal: Ability to remain free from injury will improve Outcome: Progressing   Problem: Education: Goal: Knowledge of General Education information will improve Description: Including pain rating scale, medication(s)/side effects and non-pharmacologic comfort measures Outcome: Not Progressing   

## 2022-03-18 NOTE — Progress Notes (Signed)
Patient ID: Amy Casey, female   DOB: October 30, 1960, 61 y.o.   MRN: [id number]   Patient does not have the capacity at this time to make decisions for herself.

## 2022-03-18 NOTE — H&P (Signed)
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  She has a history of depression and schizophrenia along with hypertension, UTI, and acute kidney injury.  She went to [facility] where she was catatonic and treated with some Ativan and Rocephin for her UTI.  It sounds like she had stopped her medications prior to admission.  History of Prozac, Zyprexa, trazodone.  She is refusing to eat or take her medications.  She is unable to answer some questions and is easily irritated.  She has a sister and a son involved in her care.  She does not have an outpatient psychiatrist.  She is alert and oriented and able to tell me that it is Christmas but says that she does not want any medications.  I spoke with Dr. Toni Amend about forced meds and he agreed.  She is definitely guarded and paranoid.  She denies suicidal thoughts.  Associated Signs/Symptoms: Depression Symptoms:  anhedonia, fatigue, impaired memory, decreased appetite, (Hypo) Manic Symptoms:  Delusions, Irritable Mood, Anxiety Symptoms:  Social Anxiety, Psychotic Symptoms:  Paranoia, PTSD Symptoms: NA Total Time spent with patient: 1 hour  Past Psychiatric History: History of schizophrenia and noncompliance with medication or follow-up appointments.  Is the patient at risk to self? Yes.    Has the patient been a risk to self in the past 6 months? Yes.    Has the patient been a risk to self within the distant past? Yes.    Is the patient a risk to others? No.  Has the patient been a risk to  others in the past 6 months? No.  Has the patient been a risk to others within the distant past? No.   [place] Scale:  Flowsheet Row Admission (Current) from 03/17/2022 in [facility] ED to Hosp-Admission (Discharged) from 03/12/2022 in [facility] ED from 03/11/2022 in [facility]  C-SSRS RISK CATEGORY No Risk No Risk No Risk        Prior Inpatient Therapy: Yes.   If yes, describe [facility] in May 2020 Prior Outpatient Therapy: Yes.   If yes, describe unknown  Alcohol Screening:   Substance Abuse History in the last 12 months:  No. Consequences of Substance Abuse: NA Previous Psychotropic Medications: Yes  Psychological Evaluations: Yes  Past Medical History:  Past Medical History:  Diagnosis Date   Depression    History of echocardiogram 2011   [facility]   History of renal stone    Hyperlipidemia    Hypertension    Hypokalemia    Nephrolithiasis    Obesity    Schizophrenia (HCC)    Wears glasses     Past Surgical History:  Procedure Laterality Date   LUMBAR PUNCTURE  2015   WISDOM TOOTH EXTRACTION     Family History:  Family History  Problem Relation Age of Onset   Diabetes Mother    Heart disease Mother    Chronic Renal Failure Mother    Cancer Maternal Uncle        lung   Kidney disease Mother        dialysis   Stroke Neg Hx    Hypertension Neg Hx  Hyperlipidemia Neg Hx    Family Psychiatric  History: Unknown Tobacco Screening:  Social History   Tobacco Use  Smoking Status Never  Smokeless Tobacco Never    BH Tobacco Counseling     Are you interested in Tobacco Cessation Medications?  No value filed. Counseled patient on smoking cessation:  No value filed. Reason Tobacco Screening Not Completed: No value filed.       Social History:  Social History   Substance and Sexual Activity  Alcohol Use No     Social History   Substance and Sexual Activity  Drug Use No     Additional Social History:                           Allergies:   Allergies  Allergen Reactions   Paliperidone Swelling and Other (See Comments)    Angioedema, drooling, slurred speech, ptosis   Lisinopril Swelling, Rash and Other (See Comments)    Angioedema, also    Sulfa Antibiotics Itching   Lab Results:  Results for orders placed or performed during the hospital encounter of 03/17/22 (from the past 48 hour(s))  Glucose, capillary     Status: Abnormal   Collection Time: 03/17/22  3:53 PM  Result Value Ref Range   Glucose-Capillary 111 (H) 70 - 99 mg/dL    Comment: Glucose reference range applies only to samples taken after fasting for at least 8 hours.  Glucose, capillary     Status: Abnormal   Collection Time: 03/18/22  8:20 AM  Result Value Ref Range   Glucose-Capillary 122 (H) 70 - 99 mg/dL    Comment: Glucose reference range applies only to samples taken after fasting for at least 8 hours.    Blood Alcohol level:  Lab Results  Component Value Date   ETH <10 01/04/2021   ETH <10 01/01/2020    Metabolic Disorder Labs:  Lab Results  Component Value Date   HGBA1C 6.3 (A) 09/07/2021   MPG 119.76 04/20/2019   MPG 117 (H) 06/16/2015   No results found for: "PROLACTIN" Lab Results  Component Value Date   CHOL 177 12/31/2020   TRIG 82 12/31/2020   HDL 43 12/31/2020   CHOLHDL 4.1 12/31/2020   VLDL 14 07/19/2017   LDLCALC 119 (H) 12/31/2020   LDLCALC 91 07/22/2019    Current Medications: Current Facility-Administered Medications  Medication Dose Route Frequency Provider Last Rate Last Admin   acetaminophen (TYLENOL) tablet 650 mg  650 mg Oral Q6H PRN Clapacs, Jackquline Denmark, MD       alum & mag hydroxide-simeth (MAALOX/MYLANTA) 200-200-20 MG/5ML suspension 30 mL  30 mL Oral Q4H PRN Clapacs, John T, MD       amLODipine (NORVASC) tablet 10 mg  10 mg Oral Daily Clapacs, John T, MD       benztropine (COGENTIN) tablet 1 mg  1 mg Oral Q6H PRN Clapacs, John T, MD        Or   benztropine mesylate (COGENTIN) injection 0.5 mg  0.5 mg Intramuscular Q6H PRN Clapacs, Jackquline Denmark, MD       bisacodyl (DULCOLAX) EC tablet 5 mg  5 mg Oral Daily PRN Clapacs, Jackquline Denmark, MD       diltiazem (CARDIZEM) injection 10 mg  10 mg Intravenous Q6H PRN Clapacs, John T, MD       feeding supplement (ENSURE ENLIVE / ENSURE PLUS) liquid 237 mL  237 mL Oral BID BM Clapacs, Jackquline Denmark, MD  haloperidol (HALDOL) tablet 2 mg  2 mg Oral Q6H PRN Clapacs, Jackquline DenmarkJohn T, MD       Or   haloperidol lactate (HALDOL) injection 2 mg  2 mg Intramuscular Q6H PRN Clapacs, John T, MD       insulin aspart (novoLOG) injection 0-15 Units  0-15 Units Subcutaneous TID WC Clapacs, Jackquline DenmarkJohn T, MD   2 Units at 03/18/22 0847   insulin aspart (novoLOG) injection 0-5 Units  0-5 Units Subcutaneous QHS Clapacs, John T, MD       LORazepam (ATIVAN) tablet 0.5 mg  0.5 mg Oral BH-[id number] Decorian Schuenemann Edward, DO       magnesium hydroxide (MILK OF MAGNESIA) suspension 30 mL  30 mL Oral Daily PRN Clapacs, Jackquline DenmarkJohn T, MD       metoprolol tartrate (LOPRESSOR) tablet 50 mg  50 mg Oral BID Clapacs, Jackquline DenmarkJohn T, MD       multivitamin with minerals tablet 1 tablet  1 tablet Oral Daily Clapacs, John T, MD       OLANZapine zydis (ZYPREXA) disintegrating tablet 5 mg  5 mg Oral BH-q8a4p Katrinia Straker Edward, DO       Or   OLANZapine [facility](ZYPREXA) injection 5 mg  5 mg Intramuscular BH-q8a4p Avangelina Flight Edward, DO       polyethylene glycol (MIRALAX / GLYCOLAX) packet 17 g  17 g Oral Daily PRN Clapacs, John T, MD       PTA Medications: Medications Prior to Admission  Medication Sig Dispense Refill Last Dose   amLODipine (NORVASC) 10 MG tablet TAKE 1 TABLET BY MOUTH ONCE DAILY. APPOINTMENT REQUIRED FOR FUTURE REFILLS. (Patient taking differently: Take 10 mg by mouth in the morning.) 90 tablet 3    metoprolol tartrate (LOPRESSOR) 50 MG tablet Take 1 tablet (50 mg total) by mouth 2 (two) times daily.      OLANZapine (ZYPREXA) 10 MG tablet Take 1  tablet (10 mg total) by mouth at bedtime. 90 tablet 0    rosuvastatin (CRESTOR) 10 MG tablet Take 1 tablet (10 mg total) by mouth daily. 90 tablet 3    solifenacin (VESICARE) 10 MG tablet Take 1 tablet (10 mg total) by mouth daily. 30 tablet 2     Musculoskeletal: Strength & Muscle Tone: within normal limits Gait & Station: unsteady Patient leans: N/A            Psychiatric Specialty Exam:  Presentation  General Appearance:  Appropriate for Environment; Casual  Eye Contact: Fair  Speech: -- (a little easier to understand when speaking ot me. mumbles incoherent.)  Speech Volume: Normal  Handedness:No data recorded  Mood and Affect  Mood: -- (Afraid)  Affect: Restricted   Thought Process  Thought Processes: Disorganized  Duration of Psychotic Symptoms: At least 2 weeks Past Diagnosis of Schizophrenia or Psychoactive disorder: Yes  Descriptions of Associations:Intact  Orientation:Full (Time, Place and Person)  Thought Content:Paranoid Ideation  Hallucinations:No data recorded Ideas of Reference:None  Suicidal Thoughts:No data recorded Homicidal Thoughts:No data recorded  Sensorium  Memory: Immediate Fair; Recent Fair; Remote Fair  Judgment: Impaired  Insight: Poor   Executive Functions  Concentration: Poor  Attention Span: Poor  Recall: [employer]  Fund of Knowledge: Fair  Language: Fair   Psychomotor Activity  Psychomotor Activity:No data recorded  Assets  Assets: Financial Resources/Insurance; Social Support; Intimacy; Housing   Sleep  Sleep:No data recorded   Physical Exam: Physical Exam Constitutional:      Appearance: Normal appearance.  HENT:     Head: Normocephalic  and atraumatic.     Mouth/Throat:     Pharynx: Oropharynx is clear.  Eyes:     Pupils: Pupils are equal, round, and reactive to light.  Cardiovascular:     Rate and Rhythm: Normal rate and regular rhythm.  Pulmonary:     Effort: Pulmonary  effort is normal.     Breath sounds: Normal breath sounds.  Abdominal:     General: Abdomen is flat.     Palpations: Abdomen is soft.  Musculoskeletal:        General: Normal range of motion.  Skin:    General: Skin is warm and dry.  Neurological:     General: No focal deficit present.     Mental Status: She is alert. Mental status is at baseline. She is disoriented.  Psychiatric:        Attention and Perception: She is inattentive.        Mood and Affect: Affect is flat and inappropriate.        Speech: Speech is delayed and tangential.        Behavior: Behavior is uncooperative.        Thought Content: Thought content is paranoid and delusional.        Cognition and Memory: Cognition is impaired. Memory is impaired.        Judgment: Judgment is inappropriate.    Review of Systems  Psychiatric/Behavioral:  Positive for depression and memory loss.    Blood pressure (!) 143/78, pulse 70, temperature 98.4 F (36.9 C), temperature source Oral, resp. rate 18, height 5' 10.5" (1.791 m), weight 75 kg, SpO2 100 %. Body mass index is 23.39 kg/m.  Treatment Plan Summary: Daily contact with patient to assess and evaluate symptoms and progress in treatment, Medication management, and Plan restart Zyprexa and Ativan with forced meds.  Observation Level/Precautions:  1 to 1 15 minute checks  Laboratory:  CBC Chemistry Profile  Psychotherapy:    Medications:    Consultations:    Discharge Concerns:    Estimated LOS:  Other:     Physician Treatment Plan for Primary Diagnosis: Schizophrenia, undifferentiated (HCC) Long Term Goal(s): Improvement in symptoms so as ready for discharge  Short Term Goals: Ability to identify changes in lifestyle to reduce recurrence of condition will improve, Ability to verbalize feelings will improve, Ability to disclose and discuss suicidal ideas, Ability to demonstrate self-control will improve, Ability to identify and develop effective coping behaviors  will improve, Ability to maintain clinical measurements within normal limits will improve, Compliance with prescribed medications will improve, and Ability to identify triggers associated with substance abuse/mental health issues will improve  Physician Treatment Plan for Secondary Diagnosis: Principal Problem:   Schizophrenia, undifferentiated (HCC)   I certify that inpatient services furnished can reasonably be expected to improve the patient's condition.    Sarina Ill, DO 12/22/20239:34 AM
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
## 2022-03-19 DIAGNOSIS — F203 Undifferentiated schizophrenia: Secondary | ICD-10-CM | POA: Diagnosis not present

## 2022-03-19 LAB — GLUCOSE, CAPILLARY: Glucose-Capillary: 123 mg/dL — ABNORMAL HIGH (ref 70–99)

## 2022-03-19 LAB — HEMOGLOBIN A1C
Hgb A1c MFr Bld: 6.5 % — ABNORMAL HIGH (ref 4.8–5.6)
Mean Plasma Glucose: 140 mg/dL

## 2022-03-19 NOTE — Plan of Care (Signed)
Pt calm, cooperative, visible in the milieu visiting with son. Pt c/o fatigue, thought process remains disorganized, mild confusion, intermittent paranoia, however improved from yesterday. Pleasant with staff. Pt able to verbalize needs, pt ambulating with an unsteady gait but walking with care. Pt had half of snack, wanted to go to bed. Assisted to bed, call bell within reach. 24 hour sleep total _.  Problem: Activity: Goal: Risk for activity intolerance will decrease Outcome: Progressing   Problem: Nutrition: Goal: Adequate nutrition will be maintained Outcome: Progressing   Problem: Coping: Goal: Level of anxiety will decrease Outcome: Progressing   Problem: Elimination: Goal: Will not experience complications related to bowel motility Outcome: Progressing   Problem: Pain Managment: Goal: General experience of comfort will improve Outcome: Progressing   Problem: Safety: Goal: Ability to remain free from injury will improve Outcome: Progressing   Problem: Skin Integrity: Goal: Risk for impaired skin integrity will decrease Outcome: Progressing

## 2022-03-19 NOTE — Progress Notes (Signed)
[facility] MD Progress Note  03/19/2022 4:21 PM Amy Casey  MRN:  [id number] Subjective:   Patient does convey adequate information today.  Indicates that she has had numerous hospitalizations in the past because of her paranoia.  Says that she felt someone was coming after her.  She is not able to elaborate on this.  She still feels feels mildly paranoid today.  She is taking her oral medications.  She did sleep well last night.  Eating well.  Her blood glucose has been within normal limits.  Says that there is just a lot of stuff going on her life but does not convey the details at this point.  Denies excessive anxiety.  No aggression on the unit.  Points out that she is worried about "sleeping clean" despite taking showers.  Not able to elicit any symptoms of OCD at this time. Principal Problem: Schizophrenia, undifferentiated (HCC) Diagnosis: Principal Problem:   Schizophrenia, undifferentiated (HCC)  Total Time spent with patient: 30 minutes   Past Medical History:  Past Medical History:  Diagnosis Date   Depression    History of echocardiogram 2011   [facility]   History of renal stone    Hyperlipidemia    Hypertension    Hypokalemia    Nephrolithiasis    Obesity    Schizophrenia (HCC)    Wears glasses     Past Surgical History:  Procedure Laterality Date   LUMBAR PUNCTURE  2015   WISDOM TOOTH EXTRACTION     Family History:  Family History  Problem Relation Age of Onset   Diabetes Mother    Heart disease Mother    Chronic Renal Failure Mother    Cancer Maternal Uncle        lung   Kidney disease Mother        dialysis   Stroke Neg Hx    Hypertension Neg Hx    Hyperlipidemia Neg Hx     Social History:  Social History   Substance and Sexual Activity  Alcohol Use No     Social History   Substance and Sexual Activity  Drug Use No    Social History   Socioeconomic History   Marital status: Married    Spouse name: Not on file   Number of children: 2   Years of  education: 12   Highest education level: Not on file  Occupational History   Not on file  Tobacco Use   Smoking status: Never   Smokeless tobacco: Never  Vaping Use   Vaping Use: Never used  Substance and Sexual Activity   Alcohol use: No   Drug use: No   Sexual activity: Not Currently  Other Topics Concern   Not on file  Social History Narrative      Married, lives at home with 2 grandchildren, mother in law lives with them, works at [employer].   Exercise - some walking.   12/2020   Social Determinants of Health   Financial Resource Strain: Not on file  Food Insecurity: No Food Insecurity (03/17/2022)   Hunger Vital Sign    Worried About Running Out of Food in the Last Year: Never true    Ran Out of Food in the Last Year: Never true  Transportation Needs: No Transportation Needs (03/17/2022)   PRAPARE - Administrator, Civil Service (Medical): No    Lack of Transportation (Non-Medical): No  Physical Activity: Not on file  Stress: Not on file  Social Connections: Not on file  Additional Social History:                         Sleep: Negative  Appetite:  Negative  Current Medications: Current Facility-Administered Medications  Medication Dose Route Frequency Provider Last Rate Last Admin   acetaminophen (TYLENOL) tablet 650 mg  650 mg Oral Q6H PRN Clapacs, Jackquline Denmark, MD       alum & mag hydroxide-simeth (MAALOX/MYLANTA) 200-200-20 MG/5ML suspension 30 mL  30 mL Oral Q4H PRN Clapacs, Jackquline Denmark, MD       amLODipine (NORVASC) tablet 10 mg  10 mg Oral Daily Clapacs, Jackquline Denmark, MD   10 mg at 03/19/22 1006   benztropine (COGENTIN) tablet 1 mg  1 mg Oral Q6H PRN Clapacs, Jackquline Denmark, MD       Or   benztropine mesylate (COGENTIN) injection 0.5 mg  0.5 mg Intramuscular Q6H PRN Clapacs, Jackquline Denmark, MD       bisacodyl (DULCOLAX) EC tablet 5 mg  5 mg Oral Daily PRN Clapacs, Jackquline Denmark, MD       diltiazem (CARDIZEM) injection 10 mg  10 mg Intravenous Q6H PRN Clapacs, Jackquline Denmark, MD        feeding supplement (ENSURE ENLIVE / ENSURE PLUS) liquid 237 mL  237 mL Oral BID BM Clapacs, John T, MD       haloperidol (HALDOL) tablet 2 mg  2 mg Oral Q6H PRN Clapacs, John T, MD       Or   haloperidol lactate (HALDOL) injection 2 mg  2 mg Intramuscular Q6H PRN Clapacs, John T, MD       insulin aspart (novoLOG) injection 0-15 Units  0-15 Units Subcutaneous TID WC Clapacs, Jackquline Denmark, MD   2 Units at 03/18/22 0847   insulin aspart (novoLOG) injection 0-5 Units  0-5 Units Subcutaneous QHS Clapacs, John T, MD       LORazepam (ATIVAN) tablet 0.5 mg  0.5 mg Oral BH-[id number] Sarina Ill, DO   0.5 mg at 03/19/22 1006   magnesium hydroxide (MILK OF MAGNESIA) suspension 30 mL  30 mL Oral Daily PRN Clapacs, Jackquline Denmark, MD       metoprolol tartrate (LOPRESSOR) tablet 50 mg  50 mg Oral BID Clapacs, Jackquline Denmark, MD   50 mg at 03/18/22 2110   multivitamin with minerals tablet 1 tablet  1 tablet Oral Daily Clapacs, John T, MD   1 tablet at 03/19/22 1006   OLANZapine zydis (ZYPREXA) disintegrating tablet 5 mg  5 mg Oral BH-q8a4p Sarina Ill, DO   5 mg at 03/19/22 1006   Or   OLANZapine (ZYPREXA) injection 5 mg  5 mg Intramuscular BH-q8a4p Herrick, Richard Edward, DO       polyethylene glycol (MIRALAX / GLYCOLAX) packet 17 g  17 g Oral Daily PRN Clapacs, Jackquline Denmark, MD        Lab Results:  Results for orders placed or performed during the hospital encounter of 03/17/22 (from the past 48 hour(s))  Glucose, capillary     Status: Abnormal   Collection Time: 03/18/22  8:20 AM  Result Value Ref Range   Glucose-Capillary 122 (H) 70 - 99 mg/dL    Comment: Glucose reference range applies only to samples taken after fasting for at least 8 hours.  Hemoglobin A1c     Status: Abnormal   Collection Time: 03/18/22 11:21 AM  Result Value Ref Range   Hgb A1c MFr Bld 6.5 (H) 4.8 - 5.6 %  Comment: (NOTE)         Prediabetes: 5.7 - 6.4         Diabetes: >6.4         Glycemic control for adults with diabetes:  <7.0    Mean Plasma Glucose 140 mg/dL    Comment: (NOTE) Performed At: [facility] [place], [place] [id number] Rush Farmer MD [id number]   Lipid panel     Status: Abnormal   Collection Time: 03/18/22 11:21 AM  Result Value Ref Range   Cholesterol 172 0 - 200 mg/dL   Triglycerides 111 <150 mg/dL   HDL 30 (L) >40 mg/dL   Total CHOL/HDL Ratio 5.7 RATIO   VLDL 22 0 - 40 mg/dL   LDL Cholesterol 120 (H) 0 - 99 mg/dL    Comment:        Total Cholesterol/HDL:CHD Risk Coronary Heart Disease Risk Table                     Men   Women  1/2 Average Risk   3.4   3.3  Average Risk       5.0   4.4  2 X Average Risk   9.6   7.1  3 X Average Risk  23.4   11.0        Use the calculated Patient Ratio above and the CHD Risk Table to determine the patient's CHD Risk.        ATP III CLASSIFICATION (LDL):  <100     mg/dL   Optimal  100-129  mg/dL   Near or Above                    Optimal  130-159  mg/dL   Borderline  160-189  mg/dL   High  >190     mg/dL   Very High Performed at [facility], [address] [place], [place] [address]   Glucose, capillary     Status: Abnormal   Collection Time: 03/18/22 11:38 AM  Result Value Ref Range   Glucose-Capillary 120 (H) 70 - 99 mg/dL    Comment: Glucose reference range applies only to samples taken after fasting for at least 8 hours.  Glucose, capillary     Status: Abnormal   Collection Time: 03/18/22  4:20 PM  Result Value Ref Range   Glucose-Capillary 111 (H) 70 - 99 mg/dL    Comment: Glucose reference range applies only to samples taken after fasting for at least 8 hours.  Glucose, capillary     Status: Abnormal   Collection Time: 03/18/22  7:36 PM  Result Value Ref Range   Glucose-Capillary 178 (H) 70 - 99 mg/dL    Comment: Glucose reference range applies only to samples taken after fasting for at least 8 hours.  Glucose, capillary     Status: Abnormal   Collection Time: 03/19/22  8:03 AM  Result  Value Ref Range   Glucose-Capillary 123 (H) 70 - 99 mg/dL    Comment: Glucose reference range applies only to samples taken after fasting for at least 8 hours.    Blood Alcohol level:  Lab Results  Component Value Date   [facility] <10 01/04/2021   ETH <10 [id number]    Metabolic Disorder Labs: Lab Results  Component Value Date   HGBA1C 6.5 (H) 03/18/2022   MPG 140 03/18/2022   MPG 119.76 04/20/2019   No results found for: "PROLACTIN" Lab Results  Component Value Date  CHOL 172 03/18/2022   TRIG 111 03/18/2022   HDL 30 (L) 03/18/2022   CHOLHDL 5.7 03/18/2022   VLDL 22 03/18/2022   LDLCALC 120 (H) 03/18/2022   LDLCALC 119 (H) 12/31/2020    Physical Findings: AIMS:  , ,  ,  ,    CIWA:    COWS:     Musculoskeletal: Strength & Muscle Tone: within normal limits Gait & Station: normal Patient leans: Right  Psychiatric Specialty Exam:  Presentation  General Appearance:  Appropriate for Environment; Casual  Eye Contact: Fair  Speech: -- (a little easier to understand when speaking ot me. mumbles incoherent.)  Speech Volume: Normal  Handedness:No data recorded  Mood and Affect  Mood: -- (Afraid)  Affect: Restricted   Thought Process  Thought Processes: Disorganized  Descriptions of Associations:Intact  Orientation:Full (Time, Place and Person)  Thought Content:Paranoid Ideation  History of Schizophrenia/Schizoaffective disorder:Yes  Duration of Psychotic Symptoms:Greater than six months  Sensorium  Memory: Immediate Fair; Recent Fair; Remote Fair  Judgment: Impaired  Insight: Poor   Executive Functions  Concentration: Poor  Attention Span: Poor  Recall: [employer] of Knowledge: Fair  Language: Fair   Psychomotor Activity  Psychomotor Activity:No data recorded  Assets  Assets: Financial Resources/Insurance; Social Support; Intimacy; Housing   Sleep  Sleep:No data recorded   Physical Exam: Physical  Exam ROS Blood pressure 123/64, pulse 62, temperature 97.7 F (36.5 C), temperature source Oral, resp. rate 18, height 5' 10.5" (1.791 m), weight 75 kg, SpO2 98 %. Body mass index is 23.39 kg/m.   Treatment Plan Summary: Daily contact with patient to assess and evaluate symptoms and progress in treatment and Medication management  12/23 Discontinue one-to-one Discontinued blood sugar checks.  Within normal limits.  Discussed with nurse.  Rulon Sera, MD 03/19/2022, 4:21 PM

## 2022-03-19 NOTE — Progress Notes (Signed)
Patient out to dining room for dinner-appetite good.

## 2022-03-19 NOTE — Progress Notes (Signed)
Patient refused lunch stating she isn't hungry. Novolog held.

## 2022-03-19 NOTE — Plan of Care (Signed)
Pt visible in milieu visiting with sister. Pts sister assisted with pts hair, combed and styled it. Pt remains disorganized, voicing delusions, however improved from yesterday. Pt stated " there are babies in here, the room is for the babies." Pt requires re-orienting, reassurance frequently. Pt was able to answer some questions. Pt is cooperative with care, took medication as ordered. Pt remains with sitter in place for safety. Pt denied pain in feet, gait remains unsteady. Pt had snack. 24 hour sleep total: 8 hours.  Problem: Clinical Measurements: Goal: Ability to maintain clinical measurements within normal limits will improve Outcome: Progressing   Problem: Activity: Goal: Risk for activity intolerance will decrease Outcome: Progressing   Problem: Nutrition: Goal: Adequate nutrition will be maintained Outcome: Progressing   Problem: Coping: Goal: Level of anxiety will decrease Outcome: Progressing   Problem: Safety: Goal: Ability to remain free from injury will improve Outcome: Progressing

## 2022-03-19 NOTE — Progress Notes (Signed)
Patient is alert and oriented times 2. Mood and affect anxious, guarded, and paranoid. Patient denies pain. Unable to complete psych assessment. Speech is soft and hard to understand. States she slept good last night. Morning meds given whole by mouth W/O difficulty. Patient refused breakfast. Patient remains on unit with Q15 minute checks in place.

## 2022-03-19 NOTE — Plan of Care (Signed)

## 2022-03-19 NOTE — Progress Notes (Signed)
   03/19/22 1000  Psych Admission Type (Psych Patients Only)  Admission Status Involuntary  Psychosocial Assessment  Patient Complaints Confusion  Eye Contact None  Facial Expression Anxious  Affect Anxious;Fearful  Speech Soft  Interaction Minimal  Motor Activity Slow  Appearance/Hygiene In scrubs  Behavior Characteristics Anxious;Guarded  Mood Anxious  Thought Process  Coherency Disorganized  Content Paranoia  Delusions Paranoid  Perception UTA  Hallucination None reported or observed  Judgment Impaired  Confusion Mild  Danger to Self  Current suicidal ideation? Denies

## 2022-03-20 DIAGNOSIS — F203 Undifferentiated schizophrenia: Secondary | ICD-10-CM | POA: Diagnosis not present

## 2022-03-20 NOTE — Plan of Care (Signed)
  Problem: Nutrition: Goal: Adequate nutrition will be maintained Outcome: Progressing   Problem: Coping: Goal: Level of anxiety will decrease Outcome: Progressing   Problem: Education: Goal: Knowledge of General Education information will improve Description: Including pain rating scale, medication(s)/side effects and non-pharmacologic comfort measures Outcome: Not Progressing   Problem: Health Behavior/Discharge Planning: Goal: Ability to manage health-related needs will improve Outcome: Not Progressing   

## 2022-03-20 NOTE — Progress Notes (Signed)
Patient is A+O x 2 sometimes 3. She is fearful, paranoid, and suspicious but is able to contract for safety. She denies SI/HI/AVH. She denies pain. Her speech is soft and mostly not able to be understood. She is able to make her needs known however. Appetite fair with encouragement and observation.  Q15 minute unit checks in place.

## 2022-03-20 NOTE — [facility] Counselor (Signed)
Adult Comprehensive Assessment  Patient ID: Amy Casey, female   DOB: [birth year], 61 y.o.   MRN: [id number]  Information Source: Information source: Patient  Current Stressors:  Patient states their primary concerns and needs for treatment are:: "I was just kinda out of it a little." Patient states their goals for this hospitilization and ongoing recovery are:: "Trying to get myself together." Educational / Learning stressors: Unable to assess Employment / Job issues: She reported that her job collapsed somewhere between Thanksgiving and now. Family Relationships: Unable to assess Financial / Lack of resources (include bankruptcy): Unable to assess Housing / Lack of housing: Unable to assess Physical health (include injuries & life threatening diseases): Unable to assess Social relationships: Unable to assess Substance abuse: Unable to assess Bereavement / Loss: Unable to assess  Living/Environment/Situation:  Living Arrangements: Spouse/significant other Living conditions (as described by patient or guardian): "Living at home with husband." Who else lives in the home?: Pt lives with her husband. How long has patient lived in current situation?: "18 years" What is atmosphere in current home: Comfortable  Family History:  Marital status: Married Number of Years Married: 10 What types of issues is patient dealing with in the relationship?: Unable to assess Are you sexually active?:  (Unable to assess) What is your sexual orientation?: Heterosexual Has your sexual activity been affected by drugs, alcohol, medication, or emotional stress?: Pt denies. Does patient have children?: Yes How many children?: 3 (Pt shares she has two daughters and a son.) How is patient's relationship with their children?: "It's ok, I gues might be a little bumpy."  Childhood History:  By whom was/is the patient raised?: Mother Additional childhood history information: Unable to assess Description of  patient's relationship with caregiver when they were a child: Unable to assess Patient's description of current relationship with people who raised him/her: Unable to assess How were you disciplined when you got in trouble as a child/adolescent?: Unable to assess Does patient have siblings?:  (Unable to assess. However, pt reports that her sister helps so she has atleast one sister.) Did patient suffer any verbal/emotional/physical/sexual abuse as a child?:  (Unable to assess) Did patient suffer from severe childhood neglect?:  (Unable to assess) Has patient ever been sexually abused/assaulted/raped as an adolescent or adult?:  (Unable to assess) Was the patient ever a victim of a crime or a disaster?:  (Unable to assess) Witnessed domestic violence?:  (Unable to assess) Has patient been affected by domestic violence as an adult?:  (Unable to assess)  Education:  Highest grade of school patient has completed: 12th grade Currently a student?: No Learning disability?: Yes What learning problems does patient have?: When asked if she had a learning disability pt stated, "Slightly" but then says I did pretty well.  Employment/Work Situation:   Employment Situation: Unemployed Patient's Job has Been Impacted by Current Illness: No What is the Longest Time Patient has Held a Job?: 30 years Where was the Patient Employed at that Time?: [employer] Has Patient ever Been in the [employer]?: No  Financial Resources:   Financial resources:  (Unable to assess, pt states I got something or had something but I don't know.")  Alcohol/Substance Abuse:   What has been your use of drugs/alcohol within the last 12 months?: Pt denies any substance use If attempted suicide, did drugs/alcohol play a role in this?:  (N/A) Alcohol/Substance Abuse Treatment Hx: Denies past history If yes, describe treatment: N/A Has alcohol/substance abuse ever caused legal problems?: No  Social  Support System:   Patient's  Community Support System: [employer]/gallery exhibitions officer System: Husband and sister Type of faith/religion: "Little, used to be." How does patient's faith help to cope with current illness?: Unable to assess  Leisure/Recreation:   Do You Have Hobbies?:  (Unable to assess)  Strengths/Needs:   What is the patient's perception of their strengths?: Unable to assess Patient states they can use these personal strengths during their treatment to contribute to their recovery: Unable to assess Patient states these barriers may affect/interfere with their treatment: Unable to assess Patient states these barriers may affect their return to the community: Unable to assess Other important information patient would like considered in planning for their treatment: Unable to assess  Discharge Plan:   Currently receiving community mental health services: No Patient states concerns and preferences for aftercare planning are: She shares that previously she received services from [facility]. She expresses that she would be interested in continued treatment upon discharge. Patient states they will know when they are safe and ready for discharge when: Unable to assess Does patient have access to transportation?: Yes Does patient have financial barriers related to discharge medications?: No Will patient be returning to same living situation after discharge?: Yes  Summary/Recommendations:   Summary and Recommendations (to be completed by the evaluator): Patient is a 61 year old,  married, female from [place], [place] [facility] [place]). She shared that she came to the hospital because she was out of it. Pt expressed a desire to get herself together. She lives at home with her husband of ten years. Much of the assessment was unable to be completed as pt was disorganized and disoriented. During discussion regarding whether pt would like her family contacted (specifically her husband and sister) pt was able to verbally give  CSW permission to speak to them but did not appear to understand the need for signed authorization to do this. Pt has insurance and does express interest in connecting with outpatient services upon discharge. She does report previously working with [employer]. Recommendations include crisis stabilization, therapeutic milieu, encourage group attendance and participation, medication management for mood stabilization and development of comprehensive mental wellness plan.  Glenis Smoker. 03/20/2022

## 2022-03-20 NOTE — Progress Notes (Signed)
[facility] MD Progress Note  03/20/2022 11:17 AM Amy Casey  MRN:  [id number] Subjective:   12/24 Patient tells me "I feel pretty good."  She is much more interactive than before.  She is now alert and oriented x 3.  Does report mild paranoia, still concerned that "something is going to happen to me."  She does not feel scared but recalls that she would sometimes get scared at home.  She is making better contact.  She is now taking her oral medications.  Denies depression, reports mild anxiety no suicidal or homicidal thoughts.  Patient has been very cooperative on the unit, not hostile or volatile.  Appetite remains poor and she needs to be encouraged to the eat, or even fed.  She is eating between 25 to 50% of her meals.  She states that she slept okay last night.  No side effects on the medication.  No new medical problems.  At home, she does not recall what medication she was taking and cannot tell me exactly if she was taking them regularly or not. Denies hallucinations but reflects that she did have auditory hallucinations many years ago.  12/23 Patient does convey adequate information today.  Indicates that she has had numerous hospitalizations in the past because of her paranoia.  Says that she felt someone was coming after her.  She is not able to elaborate on this.  She still feels feels mildly paranoid today.  She is taking her oral medications.  She did sleep well last night.  Eating well.  Her blood glucose has been within normal limits.  Says that there is just a lot of stuff going on her life but does not convey the details at this point.  Denies excessive anxiety.  No aggression on the unit.  Points out that she is worried about "sleeping clean" despite taking showers.  Not able to elicit any symptoms of OCD at this time. Principal Problem: Schizophrenia, undifferentiated ([place]) Diagnosis: Principal Problem:   Schizophrenia, undifferentiated ([place])  Total Time spent with patient: 30  minutes   Past Medical History:  Past Medical History:  Diagnosis Date   Depression    History of echocardiogram 2011   [facility]   History of renal stone    Hyperlipidemia    Hypertension    Hypokalemia    Nephrolithiasis    Obesity    Schizophrenia ([place])    Wears glasses     Past Surgical History:  Procedure Laterality Date   LUMBAR PUNCTURE  2015   WISDOM TOOTH EXTRACTION     Family History:  Family History  Problem Relation Age of Onset   Diabetes Mother    Heart disease Mother    Chronic Renal Failure Mother    Cancer Maternal Uncle        lung   Kidney disease Mother        dialysis   Stroke Neg Hx    Hypertension Neg Hx    Hyperlipidemia Neg Hx     Social History:  Social History   Substance and Sexual Activity  Alcohol Use No     Social History   Substance and Sexual Activity  Drug Use No    Social History   Socioeconomic History   Marital status: Married    Spouse name: Not on file   Number of children: 2   Years of education: 12   Highest education level: Not on file  Occupational History   Not on file  Tobacco Use  Smoking status: Never   Smokeless tobacco: Never  Vaping Use   Vaping Use: Never used  Substance and Sexual Activity   Alcohol use: No   Drug use: No   Sexual activity: Not Currently  Other Topics Concern   Not on file  Social History Narrative      Married, lives at home with 2 grandchildren, mother in law lives with them, works at [employer].   Exercise - some walking.   12/2020   Social Determinants of Health   Financial Resource Strain: Not on file  Food Insecurity: No Food Insecurity (03/17/2022)   Hunger Vital Sign    Worried About Running Out of Food in the Last Year: Never true    Ran Out of Food in the Last Year: Never true  Transportation Needs: No Transportation Needs (03/17/2022)   PRAPARE - Hydrologist (Medical): No    Lack of Transportation (Non-Medical): No  Physical  Activity: Not on file  Stress: Not on file  Social Connections: Not on file   Additional Social History:                         Sleep: Negative  Appetite:  Negative  Current Medications: Current Facility-Administered Medications  Medication Dose Route Frequency Provider Last Rate Last Admin   acetaminophen (TYLENOL) tablet 650 mg  650 mg Oral Q6H PRN Clapacs, Madie Reno, MD       alum & mag hydroxide-simeth (MAALOX/MYLANTA) 200-200-20 MG/5ML suspension 30 mL  30 mL Oral Q4H PRN Clapacs, Madie Reno, MD       amLODipine (NORVASC) tablet 10 mg  10 mg Oral Daily Clapacs, Madie Reno, MD   10 mg at 03/20/22 1046   benztropine (COGENTIN) tablet 1 mg  1 mg Oral Q6H PRN Clapacs, Madie Reno, MD       Or   benztropine mesylate (COGENTIN) injection 0.5 mg  0.5 mg Intramuscular Q6H PRN Clapacs, Madie Reno, MD       bisacodyl (DULCOLAX) EC tablet 5 mg  5 mg Oral Daily PRN Clapacs, Madie Reno, MD       diltiazem (CARDIZEM) injection 10 mg  10 mg Intravenous Q6H PRN Clapacs, John T, MD       feeding supplement (ENSURE ENLIVE / ENSURE PLUS) liquid 237 mL  237 mL Oral BID BM Clapacs, John T, MD   237 mL at 03/20/22 1050   haloperidol (HALDOL) tablet 2 mg  2 mg Oral Q6H PRN Clapacs, John T, MD       Or   haloperidol lactate (HALDOL) injection 2 mg  2 mg Intramuscular Q6H PRN Clapacs, John T, MD       LORazepam (ATIVAN) tablet 0.5 mg  0.5 mg Oral BH-q8a4p Parks Ranger, DO   0.5 mg at 03/20/22 0740   magnesium hydroxide (MILK OF MAGNESIA) suspension 30 mL  30 mL Oral Daily PRN Clapacs, John T, MD       metoprolol tartrate (LOPRESSOR) tablet 50 mg  50 mg Oral BID Clapacs, John T, MD   50 mg at 03/20/22 1045   multivitamin with minerals tablet 1 tablet  1 tablet Oral Daily Clapacs, John T, MD   1 tablet at 03/20/22 1045   OLANZapine zydis (ZYPREXA) disintegrating tablet 5 mg  5 mg Oral BH-q8a4p Parks Ranger, DO   5 mg at 03/20/22 0740   Or   OLANZapine (ZYPREXA) injection 5 mg  5 mg Intramuscular  BH-q8a4p Sarina Ill, DO       polyethylene glycol (MIRALAX / GLYCOLAX) packet 17 g  17 g Oral Daily PRN Clapacs, Jackquline Denmark, MD        Lab Results:  Results for orders placed or performed during the hospital encounter of 03/17/22 (from the past 48 hour(s))  Hemoglobin A1c     Status: Abnormal   Collection Time: 03/18/22 11:21 AM  Result Value Ref Range   Hgb A1c MFr Bld 6.5 (H) 4.8 - 5.6 %    Comment: (NOTE)         Prediabetes: 5.7 - 6.4         Diabetes: >6.4         Glycemic control for adults with diabetes: <7.0    Mean Plasma Glucose 140 mg/dL    Comment: (NOTE) Performed At: [facility] [address] [place], [place] [id number] Jolene Schimke MD OF:[id number]   Lipid panel     Status: Abnormal   Collection Time: 03/18/22 11:21 AM  Result Value Ref Range   Cholesterol 172 0 - 200 mg/dL   Triglycerides 782 <423 mg/dL   HDL 30 (L) >53 mg/dL   Total CHOL/HDL Ratio 5.7 RATIO   VLDL 22 0 - 40 mg/dL   LDL Cholesterol 614 (H) 0 - 99 mg/dL    Comment:        Total Cholesterol/HDL:CHD Risk Coronary Heart Disease Risk Table                     Men   Women  1/2 Average Risk   3.4   3.3  Average Risk       5.0   4.4  2 X Average Risk   9.6   7.1  3 X Average Risk  23.4   11.0        Use the calculated Patient Ratio above and the CHD Risk Table to determine the patient's CHD Risk.        ATP III CLASSIFICATION (LDL):  <100     mg/dL   Optimal  431-540  mg/dL   Near or Above                    Optimal  130-159  mg/dL   Borderline  086-761  mg/dL   High  >950     mg/dL   Very High Performed at [facility], [address] [place], [place] [address]   Glucose, capillary     Status: Abnormal   Collection Time: 03/18/22 11:38 AM  Result Value Ref Range   Glucose-Capillary 120 (H) 70 - 99 mg/dL    Comment: Glucose reference range applies only to samples taken after fasting for at least 8 hours.  Glucose, capillary     Status: Abnormal    Collection Time: 03/18/22  4:20 PM  Result Value Ref Range   Glucose-Capillary 111 (H) 70 - 99 mg/dL    Comment: Glucose reference range applies only to samples taken after fasting for at least 8 hours.  Glucose, capillary     Status: Abnormal   Collection Time: 03/18/22  7:36 PM  Result Value Ref Range   Glucose-Capillary 178 (H) 70 - 99 mg/dL    Comment: Glucose reference range applies only to samples taken after fasting for at least 8 hours.  Glucose, capillary     Status: Abnormal   Collection Time: 03/19/22  8:03 AM  Result Value Ref Range  Glucose-Capillary 123 (H) 70 - 99 mg/dL    Comment: Glucose reference range applies only to samples taken after fasting for at least 8 hours.    Blood Alcohol level:  Lab Results  Component Value Date   ETH <10 01/04/2021   ETH <10 [id number]    Metabolic Disorder Labs: Lab Results  Component Value Date   HGBA1C 6.5 (H) 03/18/2022   MPG 140 03/18/2022   MPG 119.76 04/20/2019   No results found for: "PROLACTIN" Lab Results  Component Value Date   CHOL 172 03/18/2022   TRIG 111 03/18/2022   HDL 30 (L) 03/18/2022   CHOLHDL 5.7 03/18/2022   VLDL 22 03/18/2022   LDLCALC 120 (H) 03/18/2022   LDLCALC 119 (H) 12/31/2020    Physical Findings: AIMS:  , ,  ,  ,    CIWA:    COWS:     Musculoskeletal: Strength & Muscle Tone: within normal limits Gait & Station: normal Patient leans: Right  Psychiatric Specialty Exam:  Presentation  General Appearance:  Appropriate for Environment; Casual  Eye Contact: good Speech: -- (a little easier to understand when speaking ot me. mumbles incoherent.)  Speech Volume: Normal  Handedness:No data recorded  Mood and Affect  Mood: -- (Afraid)  Affect: Restricted   Thought Process  Thought Processes: Disorganized  Descriptions of Associations:Intact  Orientation:Full (Time, Place and Person)  Thought Content:Paranoid Ideation  History of Schizophrenia/Schizoaffective  disorder:Yes  Duration of Psychotic Symptoms:Greater than six months No hallcination Sensorium  Memory: Immediate Fair; Recent Fair; Remote Fair  Judgment: Impaired  Insight: Poor   Executive Functions  Concentration: Poor  Attention Span: Poor  Recall: [employer] of Knowledge: Fair  Language: Fair   Psychomotor Activity  Psychomotor Activity:No data recorded  Assets  Assets: Financial Resources/Insurance; Social Support; Intimacy; Housing   Sleep  Sleep:No data recorded   Physical Exam: Physical Exam ROS Blood pressure 115/67, pulse 90, temperature 98.8 F (37.1 C), temperature source Oral, resp. rate 20, height 5' 10.5" (1.791 m), weight 75 kg, SpO2 98 %. Body mass index is 23.39 kg/m.   Treatment Plan Summary: Daily contact with patient to assess and evaluate symptoms and progress in treatment and Medication management  12/24 Will consider an increase in zyprexa to 7.5mg  q HS Will reach out to her husband soon for collateral info  12/23 Discontinue one-to-one Discontinued blood sugar checks.  Within normal limits.  Discussed with nurse.  Rulon Sera, MD 03/20/2022, 11:17 AM

## 2022-03-21 DIAGNOSIS — F203 Undifferentiated schizophrenia: Secondary | ICD-10-CM | POA: Diagnosis not present

## 2022-03-21 MED ORDER — OLANZAPINE 5 MG PO TBDP
10.0000 mg | ORAL_TABLET | ORAL | Status: DC
  Administered 2022-03-21 – 2022-03-22 (×2): 10 mg via ORAL
  Filled 2022-03-21 (×2): qty 2

## 2022-03-21 MED ORDER — OLANZAPINE 10 MG IM SOLR: 5.0000 mg | INTRAMUSCULAR | Status: DC

## 2022-03-21 NOTE — [facility] Suicide Risk Assessment (Signed)
[facility] INPATIENT:  Family/Significant Other Suicide Prevention Education  Suicide Prevention Education:  Contact Attempts: annell, canty, [phone]  has been identified by the patient as the family member/significant other with whom the patient will be residing, and identified as the person(s) who will aid the patient in the event of a mental health crisis.  With written consent from the patient, two attempts were made to provide suicide prevention education, prior to and/or following the patient's discharge.  We were unsuccessful in providing suicide prevention education.  A suicide education pamphlet was given to the patient to share with family/significant other.  Date and time of first attempt:03/21/22/9:33 AM Date and time of second attempt:  Datra Clary A [place] 03/21/2022, 9:22 AM

## 2022-03-21 NOTE — Plan of Care (Signed)
Pt calm, cooperative, pleasant, visible in milieu visiting with daughter. Pt displays intermittent confusion, paranoia, disorganized thought process, needs re-orienting and prompting to complete ADL's. Emotional support provided. Encouraged increased fluids. Pt had some orange juice and crackers for snack. Pt c/o fatigue. Increased sleep noted. 24 hour sleep total 12 hours.     Problem: Education: Goal: Knowledge of General Education information will improve Description: Including pain rating scale, medication(s)/side effects and non-pharmacologic comfort measures Outcome: Progressing   Problem: Health Behavior/Discharge Planning: Goal: Ability to manage health-related needs will improve Outcome: Progressing   Problem: Clinical Measurements: Goal: Ability to maintain clinical measurements within normal limits will improve Outcome: Progressing Goal: Will remain free from infection Outcome: Progressing Goal: Diagnostic test results will improve Outcome: Progressing Goal: Respiratory complications will improve Outcome: Progressing Goal: Cardiovascular complication will be avoided Outcome: Progressing   Problem: Activity: Goal: Risk for activity intolerance will decrease Outcome: Progressing   Problem: Nutrition: Goal: Adequate nutrition will be maintained Outcome: Progressing   Problem: Coping: Goal: Level of anxiety will decrease Outcome: Progressing   Problem: Elimination: Goal: Will not experience complications related to bowel motility Outcome: Progressing Goal: Will not experience complications related to urinary retention Outcome: Progressing   Problem: Pain Managment: Goal: General experience of comfort will improve Outcome: Progressing   Problem: Safety: Goal: Ability to remain free from injury will improve Outcome: Progressing   Problem: Skin Integrity: Goal: Risk for impaired skin integrity will decrease Outcome: Progressing

## 2022-03-21 NOTE — Plan of Care (Signed)

## 2022-03-21 NOTE — Progress Notes (Signed)
Patient is A+O x 2. Appetite is fair with encouragement. She denies SI/HI/AVH. She denies pain. She endorses slight anxiety and depression. Although still present, patient is less fearful, paranoid, and suspicious than previous days.  Zyprexa increased from 5mg  to 10mg .  Patient educated on the change.  Q15 minute unit checks in place.

## 2022-03-21 NOTE — Plan of Care (Signed)
Pt visible in the milieu visiting with family. Pt calm, cooperative, disorganized, preoccupied, intermittent confusion and paranoia. Requires re-orienting to situation, time. Continues to complain of fatigue. Pt ambulates with an unsteady gait, walks with care, safety instructions reinforced. Pt had snack, needs prompting/encouragement for ADL's. Pt had snack.  24 hour sleep total.   Problem: Education: Goal: Knowledge of General Education information will improve Description: Including pain rating scale, medication(s)/side effects and non-pharmacologic comfort measures Outcome: Progressing   Problem: Health Behavior/Discharge Planning: Goal: Ability to manage health-related needs will improve Outcome: Progressing   Problem: Clinical Measurements: Goal: Ability to maintain clinical measurements within normal limits will improve Outcome: Progressing Goal: Will remain free from infection Outcome: Progressing Goal: Diagnostic test results will improve Outcome: Progressing Goal: Respiratory complications will improve Outcome: Progressing Goal: Cardiovascular complication will be avoided Outcome: Progressing   Problem: Activity: Goal: Risk for activity intolerance will decrease Outcome: Progressing   Problem: Nutrition: Goal: Adequate nutrition will be maintained Outcome: Progressing   Problem: Coping: Goal: Level of anxiety will decrease Outcome: Progressing   Problem: Elimination: Goal: Will not experience complications related to bowel motility Outcome: Progressing Goal: Will not experience complications related to urinary retention Outcome: Progressing   Problem: Pain Managment: Goal: General experience of comfort will improve Outcome: Progressing   Problem: Safety: Goal: Ability to remain free from injury will improve Outcome: Progressing   Problem: Skin Integrity: Goal: Risk for impaired skin integrity will decrease Outcome: Progressing

## 2022-03-21 NOTE — Progress Notes (Signed)
[facility] MD Progress Note  03/21/2022 10:28 AM Amy Casey  MRN:  [id number] Subjective:   12/25 Patient is out in the day area more than before.  She communicates fairly linearly.  Still feels that people are out to get her but is not sure who.  Denies auditory and visual hallucinations.  Depression is "kind of."  Anxiety is little.  Does not recall what medication she has been on in the past.  Records indicate that she has a history of medication noncompliance.  She reports feeling chilly today but denies having any fevers or any other symptoms.  Denies homicidal or suicidal thoughts but tolerating medications well.  12/24 Patient tells me "I feel pretty good."  She is much more interactive than before.  She is now alert and oriented x 3.  Does report mild paranoia, still concerned that "something is going to happen to me."  She does not feel scared but recalls that she would sometimes get scared at home.  She is making better contact.  She is now taking her oral medications.  Denies depression, reports mild anxiety no suicidal or homicidal thoughts.  Patient has been very cooperative on the unit, not hostile or volatile.  Appetite remains poor and she needs to be encouraged to the eat, or even fed.  She is eating between 25 to 50% of her meals.  She states that she slept okay last night.  No side effects on the medication.  No new medical problems.  At home, she does not recall what medication she was taking and cannot tell me exactly if she was taking them regularly or not. Denies hallucinations but reflects that she did have auditory hallucinations many years ago.  12/23 Patient does convey adequate information today.  Indicates that she has had numerous hospitalizations in the past because of her paranoia.  Says that she felt someone was coming after her.  She is not able to elaborate on this.  She still feels feels mildly paranoid today.  She is taking her oral medications.  She did sleep well last  night.  Eating well.  Her blood glucose has been within normal limits.  Says that there is just a lot of stuff going on her life but does not convey the details at this point.  Denies excessive anxiety.  No aggression on the unit.  Points out that she is worried about "sleeping clean" despite taking showers.  Not able to elicit any symptoms of OCD at this time. Principal Problem: Schizophrenia, undifferentiated (HCC) Diagnosis: Principal Problem:   Schizophrenia, undifferentiated (HCC)  Total Time spent with patient: 31 minutes   Past Medical History:  Past Medical History:  Diagnosis Date   Depression    History of echocardiogram 2011   [facility]   History of renal stone    Hyperlipidemia    Hypertension    Hypokalemia    Nephrolithiasis    Obesity    Schizophrenia (HCC)    Wears glasses     Past Surgical History:  Procedure Laterality Date   LUMBAR PUNCTURE  2015   WISDOM TOOTH EXTRACTION     Family History:  Family History  Problem Relation Age of Onset   Diabetes Mother    Heart disease Mother    Chronic Renal Failure Mother    Cancer Maternal Uncle        lung   Kidney disease Mother        dialysis   Stroke Neg Hx    Hypertension  Neg Hx    Hyperlipidemia Neg Hx     Social History:  Social History   Substance and Sexual Activity  Alcohol Use No     Social History   Substance and Sexual Activity  Drug Use No    Social History   Socioeconomic History   Marital status: Married    Spouse name: Not on file   Number of children: 2   Years of education: 12   Highest education level: Not on file  Occupational History   Not on file  Tobacco Use   Smoking status: Never   Smokeless tobacco: Never  Vaping Use   Vaping Use: Never used  Substance and Sexual Activity   Alcohol use: No   Drug use: No   Sexual activity: Not Currently  Other Topics Concern   Not on file  Social History Narrative      Married, lives at home with 2 grandchildren, mother in law  lives with them, works at [employer].   Exercise - some walking.   12/2020   Social Determinants of Health   Financial Resource Strain: Not on file  Food Insecurity: No Food Insecurity (03/17/2022)   Hunger Vital Sign    Worried About Running Out of Food in the Last Year: Never true    Ran Out of Food in the Last Year: Never true  Transportation Needs: No Transportation Needs (03/17/2022)   PRAPARE - Administrator, Civil Service (Medical): No    Lack of Transportation (Non-Medical): No  Physical Activity: Not on file  Stress: Not on file  Social Connections: Not on file   Additional Social History:                         Sleep: Negative  Appetite:  Negative  Current Medications: Current Facility-Administered Medications  Medication Dose Route Frequency Provider Last Rate Last Admin   acetaminophen (TYLENOL) tablet 650 mg  650 mg Oral Q6H PRN Clapacs, Jackquline Denmark, MD       alum & mag hydroxide-simeth (MAALOX/MYLANTA) 200-200-20 MG/5ML suspension 30 mL  30 mL Oral Q4H PRN Clapacs, Jackquline Denmark, MD       amLODipine (NORVASC) tablet 10 mg  10 mg Oral Daily Clapacs, Jackquline Denmark, MD   10 mg at 03/21/22 0944   benztropine (COGENTIN) tablet 1 mg  1 mg Oral Q6H PRN Clapacs, Jackquline Denmark, MD       Or   benztropine mesylate (COGENTIN) injection 0.5 mg  0.5 mg Intramuscular Q6H PRN Clapacs, Jackquline Denmark, MD       bisacodyl (DULCOLAX) EC tablet 5 mg  5 mg Oral Daily PRN Clapacs, Jackquline Denmark, MD       diltiazem (CARDIZEM) injection 10 mg  10 mg Intravenous Q6H PRN Clapacs, John T, MD       feeding supplement (ENSURE ENLIVE / ENSURE PLUS) liquid 237 mL  237 mL Oral BID BM Clapacs, John T, MD   237 mL at 03/20/22 1050   haloperidol (HALDOL) tablet 2 mg  2 mg Oral Q6H PRN Clapacs, John T, MD       Or   haloperidol lactate (HALDOL) injection 2 mg  2 mg Intramuscular Q6H PRN Clapacs, John T, MD       LORazepam (ATIVAN) tablet 0.5 mg  0.5 mg Oral BH-q8a4p Sarina Ill, DO   0.5 mg at 03/21/22 0846    magnesium hydroxide (MILK OF MAGNESIA) suspension 30 mL  30  mL Oral Daily PRN Clapacs, Jackquline Denmark, MD       metoprolol tartrate (LOPRESSOR) tablet 50 mg  50 mg Oral BID Clapacs, Jackquline Denmark, MD   50 mg at 03/21/22 5974   multivitamin with minerals tablet 1 tablet  1 tablet Oral Daily Clapacs, Jackquline Denmark, MD   1 tablet at 03/21/22 0944   OLANZapine zydis (ZYPREXA) disintegrating tablet 5 mg  5 mg Oral BH-q8a4p Sarina Ill, DO   5 mg at 03/21/22 0845   Or   OLANZapine (ZYPREXA) injection 5 mg  5 mg Intramuscular BH-q8a4p Herrick, Lanny Cramp, DO       polyethylene glycol (MIRALAX / GLYCOLAX) packet 17 g  17 g Oral Daily PRN Clapacs, Jackquline Denmark, MD        Lab Results:  No results found for this or any previous visit (from the past 48 hour(s)).   Blood Alcohol level:  Lab Results  Component Value Date   ETH <10 01/04/2021   ETH <10 01/01/2020    Metabolic Disorder Labs: Lab Results  Component Value Date   HGBA1C 6.5 (H) 03/18/2022   MPG 140 03/18/2022   MPG 119.76 04/20/2019   No results found for: "PROLACTIN" Lab Results  Component Value Date   CHOL 172 03/18/2022   TRIG 111 03/18/2022   HDL 30 (L) 03/18/2022   CHOLHDL 5.7 03/18/2022   VLDL 22 03/18/2022   LDLCALC 120 (H) 03/18/2022   LDLCALC 119 (H) 12/31/2020    Physical Findings: AIMS:  , ,  ,  ,    CIWA:    COWS:     Musculoskeletal: Strength & Muscle Tone: within normal limits Gait & Station: normal Patient leans: Right  Psychiatric Specialty Exam:  Presentation  General Appearance:  Appropriate for Environment; Casual  Eye Contact: good Speech: -- (a little easier to understand when speaking ot me. mumbles incoherent.)  Speech Volume:  Mood and Affect  Mood: depressed  Affect: Restricted   Thought Process  Thought Processes: linear  Descriptions of Associations:Intact  Orientation:Full (Time, Place and Person)  Thought Content:Paranoid Ideation  History of  Schizophrenia/Schizoaffective disorder:Yes  Duration of Psychotic Symptoms:Greater than six months No hallcination Sensorium  Memory: Immediate Fair; Recent Fair; Remote Fair  Judgment: Fair   Insight: Poor   Executive Functions  Concentration: Poor  Attention Span: Poor  Recall: [employer] of Knowledge: Fair  Language: Fair   Psychomotor Activity  Psychomotor Activity:No data recorded  Assets  Assets: Financial Resources/Insurance; Social Support; Intimacy; Housing   Sleep  Sleep:No data recorded   Physical Exam: Physical Exam ROS Blood pressure 108/64, pulse 94, temperature 98.6 F (37 C), temperature source Oral, resp. rate 18, height 5' 10.5" (1.791 m), weight 75 kg, SpO2 100 %. Body mass index is 23.39 kg/m.   Treatment Plan Summary: Daily contact with patient to assess and evaluate symptoms and progress in treatment and Medication management  12/25 Increase Zyprexa to 10mg  PO q HS Collateral information from husband to determine the dosages of her medications.  12/24 Will consider an increase in zyprexa to 10 mg q HS Will reach out to her husband soon for collateral info  12/23 Discontinue one-to-one Discontinued blood sugar checks.  Within normal limits.  Discussed with nurse.  1/24, MD 03/21/2022, 10:28 AM

## 2022-03-21 NOTE — BH IP Treatment Plan (Signed)
Interdisciplinary Treatment and Diagnostic Plan Update  03/21/2022 Time of Session: 8:30AM Amy Casey MRN: [id number]  Principal Diagnosis: Schizophrenia, undifferentiated ([place])  Secondary Diagnoses: Principal Problem:   Schizophrenia, undifferentiated ([place])   Current Medications:  Current Facility-Administered Medications  Medication Dose Route Frequency Provider Last Rate Last Admin   acetaminophen (TYLENOL) tablet 650 mg  650 mg Oral Q6H PRN Clapacs, Madie Reno, MD       alum & mag hydroxide-simeth (MAALOX/MYLANTA) 200-200-20 MG/5ML suspension 30 mL  30 mL Oral Q4H PRN Clapacs, Madie Reno, MD       amLODipine (NORVASC) tablet 10 mg  10 mg Oral Daily Clapacs, Madie Reno, MD   10 mg at 03/20/22 1046   benztropine (COGENTIN) tablet 1 mg  1 mg Oral Q6H PRN Clapacs, Madie Reno, MD       Or   benztropine mesylate (COGENTIN) injection 0.5 mg  0.5 mg Intramuscular Q6H PRN Clapacs, Madie Reno, MD       bisacodyl (DULCOLAX) EC tablet 5 mg  5 mg Oral Daily PRN Clapacs, Madie Reno, MD       diltiazem (CARDIZEM) injection 10 mg  10 mg Intravenous Q6H PRN Clapacs, John T, MD       feeding supplement (ENSURE ENLIVE / ENSURE PLUS) liquid 237 mL  237 mL Oral BID BM Clapacs, John T, MD   237 mL at 03/20/22 1050   haloperidol (HALDOL) tablet 2 mg  2 mg Oral Q6H PRN Clapacs, John T, MD       Or   haloperidol lactate (HALDOL) injection 2 mg  2 mg Intramuscular Q6H PRN Clapacs, John T, MD       LORazepam (ATIVAN) tablet 0.5 mg  0.5 mg Oral BH-q8a4p Parks Ranger, DO   0.5 mg at 03/20/22 1659   magnesium hydroxide (MILK OF MAGNESIA) suspension 30 mL  30 mL Oral Daily PRN Clapacs, John T, MD       metoprolol tartrate (LOPRESSOR) tablet 50 mg  50 mg Oral BID Clapacs, John T, MD   50 mg at 03/20/22 1045   multivitamin with minerals tablet 1 tablet  1 tablet Oral Daily Clapacs, John T, MD   1 tablet at 03/20/22 1045   OLANZapine zydis (ZYPREXA) disintegrating tablet 5 mg  5 mg Oral BH-q8a4p Parks Ranger, DO    5 mg at 03/20/22 1659   Or   OLANZapine (ZYPREXA) injection 5 mg  5 mg Intramuscular BH-q8a4p Herrick, Richard Edward, DO       polyethylene glycol (MIRALAX / GLYCOLAX) packet 17 g  17 g Oral Daily PRN Clapacs, Madie Reno, MD       PTA Medications: Medications Prior to Admission  Medication Sig Dispense Refill Last Dose   amLODipine (NORVASC) 10 MG tablet TAKE 1 TABLET BY MOUTH ONCE DAILY. APPOINTMENT REQUIRED FOR FUTURE REFILLS. (Patient taking differently: Take 10 mg by mouth in the morning.) 90 tablet 3    metoprolol tartrate (LOPRESSOR) 50 MG tablet Take 1 tablet (50 mg total) by mouth 2 (two) times daily.      OLANZapine (ZYPREXA) 10 MG tablet Take 1 tablet (10 mg total) by mouth at bedtime. 90 tablet 0    rosuvastatin (CRESTOR) 10 MG tablet Take 1 tablet (10 mg total) by mouth daily. 90 tablet 3    solifenacin (VESICARE) 10 MG tablet Take 1 tablet (10 mg total) by mouth daily. 30 tablet 2     Patient Stressors: Health problems   Medication change or noncompliance  Patient Strengths: Supportive family/friends   Treatment Modalities: Medication Management, Group therapy, Case management,  1 to 1 session with clinician, Psychoeducation, Recreational therapy.   Physician Treatment Plan for Primary Diagnosis: Schizophrenia, undifferentiated ([place]) Long Term Goal(s): Improvement in symptoms so as ready for discharge   Short Term Goals: Ability to identify changes in lifestyle to reduce recurrence of condition will improve Ability to verbalize feelings will improve Ability to disclose and discuss suicidal ideas Ability to demonstrate self-control will improve Ability to identify and develop effective coping behaviors will improve Ability to maintain clinical measurements within normal limits will improve Compliance with prescribed medications will improve Ability to identify triggers associated with substance abuse/mental health issues will improve  Medication Management: Evaluate  patient's response, side effects, and tolerance of medication regimen.  Therapeutic Interventions: 1 to 1 sessions, Unit Group sessions and Medication administration.  Evaluation of Outcomes: Not Met  Physician Treatment Plan for Secondary Diagnosis: Principal Problem:   Schizophrenia, undifferentiated ([place])  Long Term Goal(s): Improvement in symptoms so as ready for discharge   Short Term Goals: Ability to identify changes in lifestyle to reduce recurrence of condition will improve Ability to verbalize feelings will improve Ability to disclose and discuss suicidal ideas Ability to demonstrate self-control will improve Ability to identify and develop effective coping behaviors will improve Ability to maintain clinical measurements within normal limits will improve Compliance with prescribed medications will improve Ability to identify triggers associated with substance abuse/mental health issues will improve     Medication Management: Evaluate patient's response, side effects, and tolerance of medication regimen.  Therapeutic Interventions: 1 to 1 sessions, Unit Group sessions and Medication administration.  Evaluation of Outcomes: Not Met   RN Treatment Plan for Primary Diagnosis: Schizophrenia, undifferentiated ([place]) Long Term Goal(s): Knowledge of disease and therapeutic regimen to maintain health will improve  Short Term Goals: Ability to remain free from injury will improve, Ability to verbalize frustration and anger appropriately will improve, Ability to demonstrate self-control, Ability to participate in decision making will improve, Ability to verbalize feelings will improve, Ability to identify and develop effective coping behaviors will improve, and Compliance with prescribed medications will improve  Medication Management: RN will administer medications as ordered by provider, will assess and evaluate patient's response and provide education to patient for prescribed  medication. RN will report any adverse and/or side effects to prescribing provider.  Therapeutic Interventions: 1 on 1 counseling sessions, Psychoeducation, Medication administration, Evaluate responses to treatment, Monitor vital signs and CBGs as ordered, Perform/monitor CIWA, COWS, AIMS and Fall Risk screenings as ordered, Perform wound care treatments as ordered.  Evaluation of Outcomes: Not Met   LCSW Treatment Plan for Primary Diagnosis: Schizophrenia, undifferentiated ([place]) Long Term Goal(s): Safe transition to appropriate next level of care at discharge, Engage patient in therapeutic group addressing interpersonal concerns.  Short Term Goals: Engage patient in aftercare planning with referrals and resources, Increase social support, Increase ability to appropriately verbalize feelings, Increase emotional regulation, Facilitate acceptance of mental health diagnosis and concerns, and Increase skills for wellness and recovery  Therapeutic Interventions: Assess for all discharge needs, 1 to 1 time with Social worker, Explore available resources and support systems, Assess for adequacy in community support network, Educate family and significant other(s) on suicide prevention, Complete Psychosocial Assessment, Interpersonal group therapy.  Evaluation of Outcomes: Not Met   Progress in Treatment: Attending groups: No. Participating in groups: No. Taking medication as prescribed: Yes. Toleration medication: Yes. Family/Significant other contact made: No, will contact:  when given permission Patient understands diagnosis: No. Discussing patient identified problems/goals with staff: Yes. Medical problems stabilized or resolved: Yes. Denies suicidal/homicidal ideation: Yes. Issues/concerns per patient self-inventory: No. Other: None  New problem(s) identified: No, Describe:  None  New Short Term/Long Term Goal(s): Patient to work towards elimination of symptoms of psychosis, medication  management for mood stabilization; development of comprehensive mental wellness plan.   Patient Goals:  "get well and get out of here"  Discharge Plan or Barriers: CSW will assist pt with development of appropriate discharge/aftercare plan.   Reason for Continuation of Hospitalization: Delusions  Medication stabilization Other; describe paranoia  Estimated Length of Stay:1-7 days  Last 3 [place] Suicide Severity Risk Score: Flowsheet Row Admission (Current) from 03/17/2022 in [facility] ED to Hosp-Admission (Discharged) from 03/12/2022 in [facility] ED from 03/11/2022 in [place] No Risk No Risk No Risk       Last PHQ 2/9 Scores:    05/25/2021   10:00 AM 02/23/2021    1:47 PM 02/23/2021    1:46 PM  Depression screen PHQ 2/9  Decreased Interest 0 0 0  Down, Depressed, Hopeless 0 0 0  PHQ - 2 Score 0 0 0  Altered sleeping  0   Tired, decreased energy  0   Change in appetite  1   Feeling bad or failure about yourself   0   Trouble concentrating  1   Moving slowly or fidgety/restless  0   Suicidal thoughts  0   PHQ-9 Score  2   Difficult doing work/chores  Not difficult at all     Scribe for Treatment Team: Latifah Padin A [place], [place] 03/21/2022 8:57 AM

## 2022-03-21 NOTE — Group Note (Signed)
LCSW Group Therapy Note  Group Date: 03/21/2022 Start Time: 1030 End Time: 1115   Type of Therapy and Topic:  Group Therapy - Healthy vs Unhealthy Coping Skills  Participation Level:  Minimal   Description of Group The focus of this group was to determine what unhealthy coping techniques typically are used by group members and what healthy coping techniques would be helpful in coping with various problems. Patients were guided in becoming aware of the differences between healthy and unhealthy coping techniques. Patients were asked to identify 2-3 healthy coping skills they would like to learn to use more effectively.  Therapeutic Goals Patients learned that coping is what human beings do all day long to deal with various situations in their lives Patients defined and discussed healthy vs unhealthy coping techniques Patients identified their preferred coping techniques and identified whether these were healthy or unhealthy Patients determined 2-3 healthy coping skills they would like to become more familiar with and use more often. Patients provided support and ideas to each other   Summary of Patient Progress:  During group, she expressed that she missed her family. Patient proved open to input from peers and feedback from CSW. Patient demonstrated poor insight into the subject matter, was respectful of peers, and participated throughout the entire session. She said that she does not know why she is here but states her family is her main support and uses them to assist with coping.   Therapeutic Modalities Cognitive Behavioral Therapy Motivational Interviewing  Starnisha Batrez A [place], Theresia Majors 03/21/2022  11:34 AM

## 2022-03-22 DIAGNOSIS — F203 Undifferentiated schizophrenia: Secondary | ICD-10-CM | POA: Diagnosis not present

## 2022-03-22 MED ORDER — OLANZAPINE 5 MG PO TBDP
5.0000 mg | ORAL_TABLET | ORAL | Status: DC
  Administered 2022-03-22 – 2022-03-27 (×10): 5 mg via ORAL
  Filled 2022-03-22 (×11): qty 1

## 2022-03-22 MED ORDER — OLANZAPINE 10 MG IM SOLR: 5.0000 mg | INTRAMUSCULAR | Status: DC

## 2022-03-22 NOTE — [facility] Suicide Risk Assessment (Signed)
[facility] INPATIENT:  Family/Significant Other Suicide Prevention Education  Suicide Prevention Education:  Contact Attempts: [email or web address], wasilewski, [phone]  has been identified by the patient as the family member/significant other with whom the patient will be residing, and identified as the person(s) who will aid the patient in the event of a mental health crisis.  With written consent from the patient, two attempts were made to provide suicide prevention education, prior to and/or following the patient's discharge.  We were unsuccessful in providing suicide prevention education.  A suicide education pamphlet was given to the patient to share with family/significant other.  Date and time of first attempt:03/21/22/9:33 AM Date and time of second attempt: 03/22/22/2:23PM  Amy Casey A [place] 03/22/2022, 2:23 PM

## 2022-03-22 NOTE — [facility] Suicide Risk Assessment (Addendum)
[facility] INPATIENT:  Family/Significant Other Suicide Prevention Education  Suicide Prevention Education:  Education Completed;  anea, fodera, [phone] , has been identified by the patient as the family member/significant other with whom the patient will be residing, and identified as the person(s) who will aid the patient in the event of a mental health crisis (suicidal ideations/suicide attempt).  With written consent from the patient, the family member/significant other has been provided the following suicide prevention education, prior to the and/or following the discharge of the patient.  The suicide prevention education provided includes the following: Suicide risk factors Suicide prevention and interventions National Suicide Hotline telephone number [facility] assessment telephone number [facility] Emergency Assistance 911 [facility] and/or Residential Mobile Crisis Unit telephone number  Request made of family/significant other to: Remove weapons (e.g., guns, rifles, knives), all items previously/currently identified as safety concern.   Remove drugs/medications (over-the-counter, prescriptions, illicit drugs), all items previously/currently identified as a safety concern.  The family member/significant other verbalizes understanding of the suicide prevention education information provided.  The family member/significant other agrees to remove the items of safety concern listed above.  He stated that pt at baseline is very smart and "outspoken young lady and easy going." He stated that she is much less paranoid and has an open personality. He stated that she used to have a psychiatrist at [facility] but they could no longer take her insurance and would like a referral for medication management. He said that pt reduced medication intake because she felt that she it was not doing anything for her and felt that she did not need it.  He stated that pt is not a  harm and has no weapons in the home. He said that pt is her own guardian and does not have guardianship status.   Amy Casey A [place] 03/22/2022, 2:56 PM

## 2022-03-22 NOTE — Plan of Care (Signed)
Pt alert yet confused at times. Pt is paranoid and states her plate is supposed to be plastic, pt looked at medications multiple times and eventually took meds. Assessment complete. Plan of care ongoing. Safety rounds maintained. Problem: Clinical Measurements: Goal: Will remain free from infection Outcome: Progressing   Problem: Nutrition: Goal: Adequate nutrition will be maintained Outcome: Progressing   Problem: Coping: Goal: Level of anxiety will decrease Outcome: Progressing   Problem: Elimination: Goal: Will not experience complications related to bowel motility Outcome: Progressing Goal: Will not experience complications related to urinary retention Outcome: Progressing   Problem: Pain Managment: Goal: General experience of comfort will improve Outcome: Progressing   Problem: Safety: Goal: Ability to remain free from injury will improve Outcome: Progressing

## 2022-03-22 NOTE — Progress Notes (Addendum)
[facility] MD Progress Note  03/22/2022 11:35 AM Amy Casey  MRN:  [id number] Subjective:   12/26 Patient is seen in her room today.  She mumbles very softly, barely discernible.  When asked about her mood, she replies "I don't know."  Denies have any new medical problems.  No stressors.  Continues to voice that she is feeling "a little scared."  Sleeping and eating well.  Staff and nursing have noticed intermittent confusion.  She questions medications and is a little resistance but takes them.   I was able to speak with the patient's husband who indicates that she had been unstable for 1 week prior to her admission.  She would skip taking her medications at time.  He does not exactly know how much of Zyprexa that he takes as he is currently at work right now.  She works at [employer] and seems to do well.  Does not have any paranoia at all at baseline.  We did discuss that she does have some paranoia.  Her husband would like to pick her up by the end of the week.  He would like an update by provider on Friday on how she is doing, as well as a possible discharge if patient is ready.  12/25 Patient is out in the day area more than before.  She communicates fairly linearly.  Still feels that people are out to get her but is not sure who.  Denies auditory and visual hallucinations.  Depression is "kind of."  Anxiety is little.  Does not recall what medication she has been on in the past.  Records indicate that she has a history of medication noncompliance.  She reports feeling chilly today but denies having any fevers or any other symptoms.  Denies homicidal or suicidal thoughts but tolerating medications well.  12/24 Patient tells me "I feel pretty good."  She is much more interactive than before.  She is now alert and oriented x 3.  Does report mild paranoia, still concerned that "something is going to happen to me."  She does not feel scared but recalls that she would sometimes get scared at home.  She  is making better contact.  She is now taking her oral medications.  Denies depression, reports mild anxiety no suicidal or homicidal thoughts.  Patient has been very cooperative on the unit, not hostile or volatile.  Appetite remains poor and she needs to be encouraged to the eat, or even fed.  She is eating between 25 to 50% of her meals.  She states that she slept okay last night.  No side effects on the medication.  No new medical problems.  At home, she does not recall what medication she was taking and cannot tell me exactly if she was taking them regularly or not. Denies hallucinations but reflects that she did have auditory hallucinations many years ago.  12/23 Patient does convey adequate information today.  Indicates that she has had numerous hospitalizations in the past because of her paranoia.  Says that she felt someone was coming after her.  She is not able to elaborate on this.  She still feels feels mildly paranoid today.  She is taking her oral medications.  She did sleep well last night.  Eating well.  Her blood glucose has been within normal limits.  Says that there is just a lot of stuff going on her life but does not convey the details at this point.  Denies excessive anxiety.  No aggression on the  unit.  Points out that she is worried about "sleeping clean" despite taking showers.  Not able to elicit any symptoms of OCD at this time. Principal Problem: Schizophrenia, undifferentiated (HCC) Diagnosis: Principal Problem:   Schizophrenia, undifferentiated (HCC)  Total Time spent with patient: 31 minutes   Past Medical History:  Past Medical History:  Diagnosis Date   Depression    History of echocardiogram 2011   [facility]   History of renal stone    Hyperlipidemia    Hypertension    Hypokalemia    Nephrolithiasis    Obesity    Schizophrenia (HCC)    Wears glasses     Past Surgical History:  Procedure Laterality Date   LUMBAR PUNCTURE  2015   WISDOM TOOTH EXTRACTION      Family History:  Family History  Problem Relation Age of Onset   Diabetes Mother    Heart disease Mother    Chronic Renal Failure Mother    Cancer Maternal Uncle        lung   Kidney disease Mother        dialysis   Stroke Neg Hx    Hypertension Neg Hx    Hyperlipidemia Neg Hx     Social History:  Social History   Substance and Sexual Activity  Alcohol Use No     Social History   Substance and Sexual Activity  Drug Use No    Social History   Socioeconomic History   Marital status: Married    Spouse name: Not on file   Number of children: 2   Years of education: 12   Highest education level: Not on file  Occupational History   Not on file  Tobacco Use   Smoking status: Never   Smokeless tobacco: Never  Vaping Use   Vaping Use: Never used  Substance and Sexual Activity   Alcohol use: No   Drug use: No   Sexual activity: Not Currently  Other Topics Concern   Not on file  Social History Narrative      Married, lives at home with 2 grandchildren, mother in law lives with them, works at [employer].   Exercise - some walking.   12/2020   Social Determinants of Health   Financial Resource Strain: Not on file  Food Insecurity: No Food Insecurity (03/17/2022)   Hunger Vital Sign    Worried About Running Out of Food in the Last Year: Never true    Ran Out of Food in the Last Year: Never true  Transportation Needs: No Transportation Needs (03/17/2022)   PRAPARE - Administrator, Civil Service (Medical): No    Lack of Transportation (Non-Medical): No  Physical Activity: Not on file  Stress: Not on file  Social Connections: Not on file   Additional Social History:                         Sleep: Negative  Appetite:  Negative  Current Medications: Current Facility-Administered Medications  Medication Dose Route Frequency Provider Last Rate Last Admin   acetaminophen (TYLENOL) tablet 650 mg  650 mg Oral Q6H PRN Clapacs, John T, MD        alum & mag hydroxide-simeth (MAALOX/MYLANTA) 200-200-20 MG/5ML suspension 30 mL  30 mL Oral Q4H PRN Clapacs, John T, MD       amLODipine (NORVASC) tablet 10 mg  10 mg Oral Daily Clapacs, Jackquline Denmark, MD   10 mg at 03/22/22 1001  benztropine (COGENTIN) tablet 1 mg  1 mg Oral Q6H PRN Clapacs, Jackquline Denmark, MD       Or   benztropine mesylate (COGENTIN) injection 0.5 mg  0.5 mg Intramuscular Q6H PRN Clapacs, Jackquline Denmark, MD       bisacodyl (DULCOLAX) EC tablet 5 mg  5 mg Oral Daily PRN Clapacs, Jackquline Denmark, MD       diltiazem (CARDIZEM) injection 10 mg  10 mg Intravenous Q6H PRN Clapacs, John T, MD       feeding supplement (ENSURE ENLIVE / ENSURE PLUS) liquid 237 mL  237 mL Oral BID BM Clapacs, John T, MD   237 mL at 03/21/22 1302   haloperidol (HALDOL) tablet 2 mg  2 mg Oral Q6H PRN Clapacs, John T, MD       Or   haloperidol lactate (HALDOL) injection 2 mg  2 mg Intramuscular Q6H PRN Clapacs, John T, MD       LORazepam (ATIVAN) tablet 0.5 mg  0.5 mg Oral BH-q8a4p Sarina Ill, DO   0.5 mg at 03/22/22 0488   magnesium hydroxide (MILK OF MAGNESIA) suspension 30 mL  30 mL Oral Daily PRN Clapacs, John T, MD       metoprolol tartrate (LOPRESSOR) tablet 50 mg  50 mg Oral BID Clapacs, Jackquline Denmark, MD   50 mg at 03/21/22 2102   multivitamin with minerals tablet 1 tablet  1 tablet Oral Daily Clapacs, Jackquline Denmark, MD   1 tablet at 03/22/22 0829   OLANZapine zydis (ZYPREXA) disintegrating tablet 10 mg  10 mg Oral Delana Meyer, MD   10 mg at 03/22/22 8916   Or   OLANZapine (ZYPREXA) injection 5 mg  5 mg Intramuscular Delana Meyer, MD       polyethylene glycol (MIRALAX / GLYCOLAX) packet 17 g  17 g Oral Daily PRN Clapacs, Jackquline Denmark, MD        Lab Results:  No results found for this or any previous visit (from the past 48 hour(s)).   Blood Alcohol level:  Lab Results  Component Value Date   ETH <10 01/04/2021   ETH <10 01/01/2020    Metabolic Disorder Labs: Lab Results  Component Value Date   HGBA1C  6.5 (H) 03/18/2022   MPG 140 03/18/2022   MPG 119.76 04/20/2019   No results found for: "PROLACTIN" Lab Results  Component Value Date   CHOL 172 03/18/2022   TRIG 111 03/18/2022   HDL 30 (L) 03/18/2022   CHOLHDL 5.7 03/18/2022   VLDL 22 03/18/2022   LDLCALC 120 (H) 03/18/2022   LDLCALC 119 (H) 12/31/2020    Physical Findings: AIMS:  , ,  ,  ,    CIWA:    COWS:     Musculoskeletal: Strength & Muscle Tone: within normal limits Gait & Station: normal Patient leans: Right  Psychiatric Specialty Exam:  Presentation  General Appearance:  Appropriate for Environment; Casual  Eye Contact: good Speech: -- (a little easier to understand when speaking ot me. mumbles incoherent.)  Speech Volume:  Mood and Affect  Mood: "I don't know."   Affect: "Flat."   Thought Process  Thought Processes: linear  Descriptions of Associations:Intact  Orientation:Full (Time, Place and Person)  Thought Content:Paranoid Ideation  History of Schizophrenia/Schizoaffective disorder:Yes  Duration of Psychotic Symptoms:Greater than six months No hallcination Sensorium  Memory: Immediate Fair; Recent Fair; Remote Fair  Judgment: Fair   Insight: Poor   Executive Functions  Concentration: Poor  Attention Span: Poor  Recall: Jennelle HumanFair  Fund of Knowledge: Fair  Language: Fair   Psychomotor Activity  Psychomotor Activity:No data recorded  Assets  Assets: Financial Resources/Insurance; Social Support; Intimacy; Housing   Physical Exam: Physical Exam ROS Blood pressure (!) 109/90, pulse 86, temperature 98.3 F (36.8 C), temperature source Oral, resp. rate 16, height 5' 10.5" (1.791 m), weight 75 kg, SpO2 100 %. Body mass index is 23.39 kg/m.   Treatment Plan Summary: Daily contact with patient to assess and evaluate symptoms and progress in treatment and Medication management 12/26 Spoke with pt's husband Decrease zyprexa to 5mg  PO BID due to  sedation  12/25 Increase Zyprexa to 10mg  PO q HS Collateral information from husband to determine the dosages of her medications.  12/24 Will consider an increase in zyprexa to 10 mg q HS Will reach out to her husband soon for collateral info  12/23 Discontinue one-to-one Discontinued blood sugar checks.  Within normal limits.  Discussed with nurse.  Reggie PileAnand Kassiah Mccrory, MD 03/22/2022, 11:35 AM

## 2022-03-22 NOTE — Group Note (Signed)
[facility] LCSW Group Therapy Note    Group Date: 03/22/2022 Start Time: 1300 End Time: 1345  Type of Therapy and Topic:  Group Therapy:  Overcoming Obstacles  Participation Level:  [facility] PARTICIPATION LEVEL: Minimal  Mood:  Description of Group:   In this group patients will be encouraged to explore what they see as obstacles to their own wellness and recovery. They will be guided to discuss their thoughts, feelings, and behaviors related to these obstacles. The group will process together ways to cope with barriers, with attention given to specific choices patients can make. Each patient will be challenged to identify changes they are motivated to make in order to overcome their obstacles. This group will be process-oriented, with patients participating in exploration of their own experiences as well as giving and receiving support and challenge from other group members.  Therapeutic Goals: 1. Patient will identify personal and current obstacles as they relate to admission. 2. Patient will identify barriers that currently interfere with their wellness or overcoming obstacles.  3. Patient will identify feelings, thought process and behaviors related to these barriers. 4. Patient will identify two changes they are willing to make to overcome these obstacles:    Summary of Patient Progress   Patient was present for the entirety of the group session. Patient was an active listener and participated in the topic of discussion, but did not provide helpful advice to others or add nuance to topic of conversation.  She answered the ice breaker question of "seeing loved ones she hasn't seen in a while" as her favorite thing about the holidays, however she did not verbally discuss after that.    Therapeutic Modalities:   Cognitive Behavioral Therapy Solution Focused Therapy Motivational Interviewing Relapse Prevention Therapy   Jaki Hammerschmidt A [place], LCSWA

## 2022-03-23 DIAGNOSIS — F203 Undifferentiated schizophrenia: Secondary | ICD-10-CM | POA: Diagnosis not present

## 2022-03-23 NOTE — Plan of Care (Signed)
  Problem: Activity: Goal: Risk for activity intolerance will decrease Outcome: Progressing   Problem: Nutrition: Goal: Adequate nutrition will be maintained Outcome: Progressing   Problem: Coping: Goal: Level of anxiety will decrease Outcome: Progressing  Patient compliant with medication Denies SI/HI/A/VH and verbally contracted for safety. Q 15 minutes safety checks ongoing. Patient remains safe. Support and encouragement provided as needed.

## 2022-03-23 NOTE — Progress Notes (Signed)
[facility] MD Progress Note  03/23/2022 2:06 PM Amy Casey  MRN:  [id number] Subjective:   12/27 Patient reports doing fairly well today.  She is less sedated today.  Denies any sleepiness throughout the day thus far.  She did sleep well last night.  She recalls taking Zyprexa.  Also states that she is on Prozac 10 mg for depression.  No new medical problems.  States that she is a little bit like her normal self.   12/26 Patient is seen in her room today.  She mumbles very softly, barely discernible.  When asked about her mood, she replies "I don't know."  Denies have any new medical problems.  No stressors.  Continues to voice that she is feeling "a little scared."  Sleeping and eating well.  Staff and nursing have noticed intermittent confusion.  She questions medications and is a little resistance but takes them.   I was able to speak with the patient's husband who indicates that she had been unstable for 1 week prior to her admission.  She would skip taking her medications at time.  He does not exactly know how much of Zyprexa that he takes as he is currently at work right now.  She works at [employer] and seems to do well.  Does not have any paranoia at all at baseline.  We did discuss that she does have some paranoia.  Her husband would like to pick her up by the end of the week.  He would like an update by provider on Friday on how she is doing, as well as a possible discharge if patient is ready.  12/25 Patient is out in the day area more than before.  She communicates fairly linearly.  Still feels that people are out to get her but is not sure who.  Denies auditory and visual hallucinations.  Depression is "kind of."  Anxiety is little.  Does not recall what medication she has been on in the past.  Records indicate that she has a history of medication noncompliance.  She reports feeling chilly today but denies having any fevers or any other symptoms.  Denies homicidal or suicidal thoughts but  tolerating medications well.  12/24 Patient tells me "I feel pretty good."  She is much more interactive than before.  She is now alert and oriented x 3.  Does report mild paranoia, still concerned that "something is going to happen to me."  She does not feel scared but recalls that she would sometimes get scared at home.  She is making better contact.  She is now taking her oral medications.  Denies depression, reports mild anxiety no suicidal or homicidal thoughts.  Patient has been very cooperative on the unit, not hostile or volatile.  Appetite remains poor and she needs to be encouraged to the eat, or even fed.  She is eating between 25 to 50% of her meals.  She states that she slept okay last night.  No side effects on the medication.  No new medical problems.  At home, she does not recall what medication she was taking and cannot tell me exactly if she was taking them regularly or not. Denies hallucinations but reflects that she did have auditory hallucinations many years ago.  12/23 Patient does convey adequate information today.  Indicates that she has had numerous hospitalizations in the past because of her paranoia.  Says that she felt someone was coming after her.  She is not able to elaborate on this.  She still feels feels mildly paranoid today.  She is taking her oral medications.  She did sleep well last night.  Eating well.  Her blood glucose has been within normal limits.  Says that there is just a lot of stuff going on her life but does not convey the details at this point.  Denies excessive anxiety.  No aggression on the unit.  Points out that she is worried about "sleeping clean" despite taking showers.  Not able to elicit any symptoms of OCD at this time. Principal Problem: Schizophrenia, undifferentiated (HCC) Diagnosis: Principal Problem:   Schizophrenia, undifferentiated (HCC)  Total Time spent with patient: 31 minutes   Past Medical History:  Past Medical History:  Diagnosis  Date   Depression    History of echocardiogram 2011   [facility]   History of renal stone    Hyperlipidemia    Hypertension    Hypokalemia    Nephrolithiasis    Obesity    Schizophrenia (HCC)    Wears glasses     Past Surgical History:  Procedure Laterality Date   LUMBAR PUNCTURE  2015   WISDOM TOOTH EXTRACTION     Family History:  Family History  Problem Relation Age of Onset   Diabetes Mother    Heart disease Mother    Chronic Renal Failure Mother    Cancer Maternal Uncle        lung   Kidney disease Mother        dialysis   Stroke Neg Hx    Hypertension Neg Hx    Hyperlipidemia Neg Hx     Social History:  Social History   Substance and Sexual Activity  Alcohol Use No     Social History   Substance and Sexual Activity  Drug Use No    Social History   Socioeconomic History   Marital status: Married    Spouse name: Not on file   Number of children: 2   Years of education: 12   Highest education level: Not on file  Occupational History   Not on file  Tobacco Use   Smoking status: Never   Smokeless tobacco: Never  Vaping Use   Vaping Use: Never used  Substance and Sexual Activity   Alcohol use: No   Drug use: No   Sexual activity: Not Currently  Other Topics Concern   Not on file  Social History Narrative      Married, lives at home with 2 grandchildren, mother in law lives with them, works at [employer].   Exercise - some walking.   12/2020   Social Determinants of Health   Financial Resource Strain: Not on file  Food Insecurity: No Food Insecurity (03/17/2022)   Hunger Vital Sign    Worried About Running Out of Food in the Last Year: Never true    Ran Out of Food in the Last Year: Never true  Transportation Needs: No Transportation Needs (03/17/2022)   PRAPARE - Administrator, Civil Service (Medical): No    Lack of Transportation (Non-Medical): No  Physical Activity: Not on file  Stress: Not on file  Social Connections: Not on  file   Additional Social History:                         Sleep: Negative  Appetite:  Negative  Current Medications: Current Facility-Administered Medications  Medication Dose Route Frequency Provider Last Rate Last Admin   acetaminophen (TYLENOL) tablet 650  mg  650 mg Oral Q6H PRN Clapacs, Jackquline DenmarkJohn T, MD       alum & mag hydroxide-simeth (MAALOX/MYLANTA) 200-200-20 MG/5ML suspension 30 mL  30 mL Oral Q4H PRN Clapacs, Jackquline DenmarkJohn T, MD       amLODipine (NORVASC) tablet 10 mg  10 mg Oral Daily Clapacs, Jackquline DenmarkJohn T, MD   10 mg at 03/23/22 [id number]   benztropine (COGENTIN) tablet 1 mg  1 mg Oral Q6H PRN Clapacs, Jackquline DenmarkJohn T, MD       Or   benztropine mesylate (COGENTIN) injection 0.5 mg  0.5 mg Intramuscular Q6H PRN Clapacs, Jackquline DenmarkJohn T, MD       bisacodyl (DULCOLAX) EC tablet 5 mg  5 mg Oral Daily PRN Clapacs, Jackquline DenmarkJohn T, MD       diltiazem (CARDIZEM) injection 10 mg  10 mg Intravenous Q6H PRN Clapacs, John T, MD       feeding supplement (ENSURE ENLIVE / ENSURE PLUS) liquid 237 mL  237 mL Oral BID BM Clapacs, John T, MD   237 mL at 03/23/22 0924   haloperidol (HALDOL) tablet 2 mg  2 mg Oral Q6H PRN Clapacs, John T, MD       Or   haloperidol lactate (HALDOL) injection 2 mg  2 mg Intramuscular Q6H PRN Clapacs, John T, MD       LORazepam (ATIVAN) tablet 0.5 mg  0.5 mg Oral BH-q8a4p Sarina IllHerrick, Richard Edward, DO   0.5 mg at 03/23/22 [id number]   magnesium hydroxide (MILK OF MAGNESIA) suspension 30 mL  30 mL Oral Daily PRN Clapacs, John T, MD       metoprolol tartrate (LOPRESSOR) tablet 50 mg  50 mg Oral BID Clapacs, Jackquline DenmarkJohn T, MD   50 mg at 03/23/22 [id number]   multivitamin with minerals tablet 1 tablet  1 tablet Oral Daily Clapacs, Jackquline DenmarkJohn T, MD   1 tablet at 03/23/22 0923   OLANZapine zydis (ZYPREXA) disintegrating tablet 5 mg  5 mg Oral Delana MeyerBH-q8a4p Lakeidra Reliford, MD   5 mg at 03/23/22 [id number]   Or   OLANZapine (ZYPREXA) injection 5 mg  5 mg Intramuscular Delana MeyerBH-q8a4p Sherle Mello, MD       polyethylene glycol (MIRALAX / GLYCOLAX) packet 17 g   17 g Oral Daily PRN Clapacs, Jackquline DenmarkJohn T, MD        Lab Results:  No results found for this or any previous visit (from the past 48 hour(s)).   Blood Alcohol level:  Lab Results  Component Value Date   ETH <10 01/04/2021   ETH <10 01/01/2020    Metabolic Disorder Labs: Lab Results  Component Value Date   HGBA1C 6.5 (H) 03/18/2022   MPG 140 03/18/2022   MPG 119.76 04/20/2019   No results found for: "PROLACTIN" Lab Results  Component Value Date   CHOL 172 03/18/2022   TRIG 111 03/18/2022   HDL 30 (L) 03/18/2022   CHOLHDL 5.7 03/18/2022   VLDL 22 03/18/2022   LDLCALC 120 (H) 03/18/2022   LDLCALC 119 (H) 12/31/2020    Physical Findings: AIMS:  , ,  ,  ,    CIWA:    COWS:     Musculoskeletal: Strength & Muscle Tone: within normal limits Gait & Station: normal Patient leans: Right  Psychiatric Specialty Exam:  Presentation  General Appearance:  Appropriate for Environment; Casual  Eye Contact: good Speech: -- (a little easier to understand when speaking ot me. mumbles incoherent.)  Speech Volume:  Mood and Affect  Mood: "better   Affect: "good."  Thought Process  Thought Processes: linear  Descriptions of Associations:Intact  Orientation:Full (Time, Place and Person)  Thought Content:Paranoid Ideation  History of Schizophrenia/Schizoaffective disorder:Yes  Duration of Psychotic Symptoms:Greater than six months No hallcination Sensorium  Memory: Immediate Fair; Recent Fair; Remote Fair  Judgment: Fair   Insight: Poor   Executive Functions  Concentration: Poor  Attention Span: Poor  Recall: [employer] of Knowledge: Fair  Language: Fair   Psychomotor Activity  Psychomotor Activity:No data recorded  Assets  Assets: Financial Resources/Insurance; Social Support; Intimacy; Housing   Physical Exam: Physical Exam ROS Blood pressure 130/83, pulse 82, temperature 97.8 F (36.6 C), resp. rate 18, height 5' 10.5" (1.791  m), weight 75 kg, SpO2 100 %. Body mass index is 23.39 kg/m.   Treatment Plan Summary: Daily contact with patient to assess and evaluate symptoms and progress in treatment and Medication management 12/26 Add prozac 10mg  PO q daily. R/b/s/a.  12/26 Spoke with pt's husband Decrease zyprexa to 5mg  PO BID due to sedation  12/25 Increase Zyprexa to 10mg  PO q HS Collateral information from husband to determine the dosages of her medications.  12/24 Will consider an increase in zyprexa to 10 mg q HS Will reach out to her husband soon for collateral info  12/23 Discontinue one-to-one Discontinued blood sugar checks.  Within normal limits.  Discussed with nurse.  , MD 03/23/2022, 2:06 PM

## 2022-03-23 NOTE — [facility] Group Notes (Signed)
[facility] Group Notes:  (Nursing/MHT/Case Management/Adjunct)  Date:  03/23/2022  Time:  3:22 PM  Type of Therapy:   Yoga and Therapeutic Communication   Participation Level:  Active  Participation Quality:  Appropriate  Affect:  Appropriate  Cognitive:  Alert  Insight:  Appropriate  Engagement in Group:  Engaged  Modes of Intervention:  Activity, Discussion, Education, and Socialization  Summary of Progress/Problems: Active Participation  Amy Casey l Mitzie Na 03/23/2022, 3:22 PM

## 2022-03-24 DIAGNOSIS — F203 Undifferentiated schizophrenia: Secondary | ICD-10-CM | POA: Diagnosis not present

## 2022-03-24 NOTE — Progress Notes (Signed)
Patient is alert and oriented times 2. Mood and affect appropriate. Patient rates pain as 0/10. She denies SI, HI, and AVH. Patient does endorse feelings of anxiety and depression at this time. Patient states she slept well last night. Evening medicines administered whole by mouth without difficulty. Patient ate snack in day room; appetite was fair. Patient remains on unit with Q15 minute checks in place.   

## 2022-03-24 NOTE — [facility] Group Notes (Signed)
[facility] Group Notes:  (Nursing/MHT/Case Management/Adjunct)  Date:  03/24/2022  Time:  2:03 PM  Type of Therapy:   community meeting  Participation Level:  Active  Participation Quality:  Appropriate  Affect:  Appropriate  Cognitive:  Appropriate  Insight:  Limited  Engagement in Group:  Developing/Improving  Modes of Intervention:  Discussion and Education  Summary of Progress/Problems:  [name] 03/24/2022, 2:03 PM

## 2022-03-24 NOTE — Progress Notes (Signed)
[facility] MD Progress Note  03/24/2022 4:15 PM Amy Casey  MRN:  [id number] Subjective: Patient seen and chart reviewed.  This is a 61 year old woman with a history of schizophrenia.  She appears to be doing better.  She spent most of her day sitting in front of the television but was able to get up and walk to her room with me and make some conversation.  She describes herself as anxious mostly about when she is going to be discharged.  She has been compliant with medicine and seems to be getting more lucid in her thinking. Principal Problem: Schizophrenia, undifferentiated (HCC) Diagnosis: Principal Problem:   Schizophrenia, undifferentiated (HCC)  Total Time spent with patient: 30 minutes  Past Psychiatric History: Past history of schizophrenia  Past Medical History:  Past Medical History:  Diagnosis Date   Depression    History of echocardiogram 2011   [facility]   History of renal stone    Hyperlipidemia    Hypertension    Hypokalemia    Nephrolithiasis    Obesity    Schizophrenia (HCC)    Wears glasses     Past Surgical History:  Procedure Laterality Date   LUMBAR PUNCTURE  2015   WISDOM TOOTH EXTRACTION     Family History:  Family History  Problem Relation Age of Onset   Diabetes Mother    Heart disease Mother    Chronic Renal Failure Mother    Cancer Maternal Uncle        lung   Kidney disease Mother        dialysis   Stroke Neg Hx    Hypertension Neg Hx    Hyperlipidemia Neg Hx    Family Psychiatric  History: See previous Social History:  Social History   Substance and Sexual Activity  Alcohol Use No     Social History   Substance and Sexual Activity  Drug Use No    Social History   Socioeconomic History   Marital status: Married    Spouse name: Not on file   Number of children: 2   Years of education: 12   Highest education level: Not on file  Occupational History   Not on file  Tobacco Use   Smoking status: Never   Smokeless tobacco: Never   Vaping Use   Vaping Use: Never used  Substance and Sexual Activity   Alcohol use: No   Drug use: No   Sexual activity: Not Currently  Other Topics Concern   Not on file  Social History Narrative      Married, lives at home with 2 grandchildren, mother in law lives with them, works at [employer].   Exercise - some walking.   12/2020   Social Determinants of Health   Financial Resource Strain: Not on file  Food Insecurity: No Food Insecurity (03/17/2022)   Hunger Vital Sign    Worried About Running Out of Food in the Last Year: Never true    Ran Out of Food in the Last Year: Never true  Transportation Needs: No Transportation Needs (03/17/2022)   PRAPARE - Administrator, Civil Service (Medical): No    Lack of Transportation (Non-Medical): No  Physical Activity: Not on file  Stress: Not on file  Social Connections: Not on file   Additional Social History:                         Sleep: Fair  Appetite:  Fair  Current  Medications: Current Facility-Administered Medications  Medication Dose Route Frequency Provider Last Rate Last Admin   acetaminophen (TYLENOL) tablet 650 mg  650 mg Oral Q6H PRN Takyia Sindt, Jackquline Denmark, MD       alum & mag hydroxide-simeth (MAALOX/MYLANTA) 200-200-20 MG/5ML suspension 30 mL  30 mL Oral Q4H PRN Brittani Purdum, Jackquline Denmark, MD       amLODipine (NORVASC) tablet 10 mg  10 mg Oral Daily Miciah Covelli, Jackquline Denmark, MD   10 mg at 03/24/22 5852   benztropine (COGENTIN) tablet 1 mg  1 mg Oral Q6H PRN Eugenio Dollins, Jackquline Denmark, MD       Or   benztropine mesylate (COGENTIN) injection 0.5 mg  0.5 mg Intramuscular Q6H PRN Taiz Bickle, Jackquline Denmark, MD       bisacodyl (DULCOLAX) EC tablet 5 mg  5 mg Oral Daily PRN Elwanda Moger, Jackquline Denmark, MD       diltiazem (CARDIZEM) injection 10 mg  10 mg Intravenous Q6H PRN Kajuana Shareef T, MD       feeding supplement (ENSURE ENLIVE / ENSURE PLUS) liquid 237 mL  237 mL Oral BID BM Yu Peggs T, MD   237 mL at 03/24/22 0904   haloperidol (HALDOL) tablet 2  mg  2 mg Oral Q6H PRN Hajime Asfaw T, MD       Or   haloperidol lactate (HALDOL) injection 2 mg  2 mg Intramuscular Q6H PRN Rayneisha Bouza T, MD       LORazepam (ATIVAN) tablet 0.5 mg  0.5 mg Oral [id number] Sarina Ill, DO   0.5 mg at 03/24/22 1558   magnesium hydroxide (MILK OF MAGNESIA) suspension 30 mL  30 mL Oral Daily PRN Dajiah Kooi T, MD       metoprolol tartrate (LOPRESSOR) tablet 50 mg  50 mg Oral BID Adina Puzzo, Jackquline Denmark, MD   50 mg at 03/24/22 7782   multivitamin with minerals tablet 1 tablet  1 tablet Oral Daily Cheralyn Oliver, Jackquline Denmark, MD   1 tablet at 03/24/22 0904   OLANZapine zydis (ZYPREXA) disintegrating tablet 5 mg  5 mg Oral Delana Meyer, MD   5 mg at 03/24/22 1558   Or   OLANZapine (ZYPREXA) injection 5 mg  5 mg Intramuscular Delana Meyer, MD       polyethylene glycol (MIRALAX / GLYCOLAX) packet 17 g  17 g Oral Daily PRN Lagina Reader, Jackquline Denmark, MD        Lab Results: No results found for this or any previous visit (from the past 48 hour(s)).  Blood Alcohol level:  Lab Results  Component Value Date   ETH <10 01/04/2021   ETH <10 01/01/2020    Metabolic Disorder Labs: Lab Results  Component Value Date   HGBA1C 6.5 (H) 03/18/2022   MPG 140 03/18/2022   MPG 119.76 04/20/2019   No results found for: "PROLACTIN" Lab Results  Component Value Date   CHOL 172 03/18/2022   TRIG 111 03/18/2022   HDL 30 (L) 03/18/2022   CHOLHDL 5.7 03/18/2022   VLDL 22 03/18/2022   LDLCALC 120 (H) 03/18/2022   LDLCALC 119 (H) 12/31/2020    Physical Findings: AIMS:  , ,  ,  ,    CIWA:    COWS:     Musculoskeletal: Strength & Muscle Tone: within normal limits Gait & Station: normal Patient leans: N/A  Psychiatric Specialty Exam:  Presentation  General Appearance:  Appropriate for Environment; Casual  Eye Contact: Fair  Speech: -- (a little easier to understand when  speaking ot me. mumbles incoherent.)  Speech Volume: Normal  Handedness:No data  recorded  Mood and Affect  Mood: -- (Afraid)  Affect: Restricted   Thought Process  Thought Processes: Disorganized  Descriptions of Associations:Intact  Orientation:Full (Time, Place and Person)  Thought Content:Paranoid Ideation  History of Schizophrenia/Schizoaffective disorder:Yes  Duration of Psychotic Symptoms:Greater than six months  Hallucinations:No data recorded Ideas of Reference:None  Suicidal Thoughts:No data recorded Homicidal Thoughts:No data recorded  Sensorium  Memory: Immediate Fair; Recent Fair; Remote Fair  Judgment: Impaired  Insight: Poor   Executive Functions  Concentration: Poor  Attention Span: Poor  Recall: [employer] of Knowledge: Fair  Language: Fair   Psychomotor Activity  Psychomotor Activity:No data recorded  Assets  Assets: Financial Resources/Insurance; Social Support; Intimacy; Housing   Sleep  Sleep:No data recorded   Physical Exam: Physical Exam Vitals and nursing note reviewed.  Constitutional:      Appearance: Normal appearance.  HENT:     Head: Normocephalic and atraumatic.     Mouth/Throat:     Pharynx: Oropharynx is clear.  Eyes:     Pupils: Pupils are equal, round, and reactive to light.  Cardiovascular:     Rate and Rhythm: Normal rate and regular rhythm.  Pulmonary:     Effort: Pulmonary effort is normal.     Breath sounds: Normal breath sounds.  Abdominal:     General: Abdomen is flat.     Palpations: Abdomen is soft.  Musculoskeletal:        General: Normal range of motion.  Skin:    General: Skin is warm and dry.  Neurological:     General: No focal deficit present.     Mental Status: She is alert. Mental status is at baseline.  Psychiatric:        Attention and Perception: Attention normal.        Mood and Affect: Mood is anxious. Affect is blunt.        Speech: Speech is delayed.        Behavior: Behavior is slowed.        Thought Content: Thought content normal.         Cognition and Memory: Memory is impaired.    Review of Systems  Constitutional: Negative.   HENT: Negative.    Eyes: Negative.   Respiratory: Negative.    Cardiovascular: Negative.   Gastrointestinal: Negative.   Musculoskeletal: Negative.   Skin: Negative.   Neurological: Negative.   Psychiatric/Behavioral:  Negative for depression, hallucinations, substance abuse and suicidal ideas. The patient is nervous/anxious.    Blood pressure 118/74, pulse 73, temperature 98 F (36.7 C), temperature source Oral, resp. rate 18, height 5' 10.5" (1.791 m), weight 75 kg, SpO2 100 %. Body mass index is 23.39 kg/m.   Treatment Plan Summary: Medication management and Plan reviewed medication.  Back on olanzapine.  We will leave orders in place for today and reassess tomorrow.  Probably getting closer to baseline may be ready to go home soon.  No immediate change to plan.  Mordecai Rasmussen, MD 03/24/2022, 4:15 PM

## 2022-03-24 NOTE — Plan of Care (Signed)
D- Patient alert and oriented. Patient presents with a pleasant mood and affect. Patient laughing with this Clinical research associate. Patient denies SI, HI, AVH, and pain. Patient stated that her goal was to be helpful.   A- Scheduled medications administered to patient, per MD orders. Support and encouragement provided.  Routine safety checks conducted every 15 minutes.  Patient informed to notify staff with problems or concerns.  R- No adverse drug reactions noted. Patient contracts for safety at this time. Patient compliant with medications and treatment plan. Patient receptive, calm, and cooperative. Patient interacts well with others on the unit.  Patient remains safe at this time.  Problem: Clinical Measurements: Goal: Diagnostic test results will improve Outcome: Progressing Goal: Respiratory complications will improve Outcome: Progressing Goal: Cardiovascular complication will be avoided Outcome: Progressing   Problem: Activity: Goal: Risk for activity intolerance will decrease Outcome: Progressing   Problem: Nutrition: Goal: Adequate nutrition will be maintained Outcome: Progressing   Problem: Coping: Goal: Level of anxiety will decrease Outcome: Progressing   Problem: Elimination: Goal: Will not experience complications related to bowel motility Outcome: Progressing Goal: Will not experience complications related to urinary retention Outcome: Progressing   Problem: Pain Managment: Goal: General experience of comfort will improve Outcome: Progressing   Problem: Safety: Goal: Ability to remain free from injury will improve Outcome: Progressing

## 2022-03-25 DIAGNOSIS — F203 Undifferentiated schizophrenia: Secondary | ICD-10-CM | POA: Diagnosis not present

## 2022-03-25 NOTE — Progress Notes (Signed)
Patient contracts for safety at this time. Patient compliant with medications and treatment plan. Patient receptive, calm, and cooperative. Patient interacts well with others on the unit.  Patient remains safe at this time. She denies SI, HI & AVH.

## 2022-03-25 NOTE — Progress Notes (Signed)
Patient is alert and oriented times 2. Mood and affect anxious, guarded, and paranoid. Patient denies pain. Unable to complete psych assessment. Speech is soft and hard to understand. States she slept good last night. Morning meds given whole by mouth W/O difficulty. Patient refused breakfast. Patient remains on unit with Q15 minute checks in place.  

## 2022-03-25 NOTE — [facility] Group Notes (Signed)
[facility] Group Notes:  (Nursing/MHT/Case Management/Adjunct)  Date:  03/25/2022  Time:  2:57 PM  Type of Therapy:   community meeting  Participation Level:  Active  Participation Quality:  Appropriate  Affect:  Appropriate  Cognitive:  Appropriate  Insight:  Appropriate  Engagement in Group:  Engaged  Modes of Intervention:  Discussion and Education  Summary of Progress/Problems:  [name] 03/25/2022, 2:57 PM

## 2022-03-25 NOTE — Group Note (Signed)
LCSW Group Therapy Note   Group Date: 03/25/2022 Start Time: 1415 End Time: 1515  Type of Therapy and Topic:  Group Therapy:  Music and Mood   Participation Level:  [facility] PARTICIPATION LEVEL: Minimal   Description of Group:   In this process group, members listened to a variety of genres of music and identified that different types of music evoke different responses.  Patients were encouraged to identify music that was soothing for them and music that was energizing for them.  Patients discussed how this knowledge can help with wellness and recovery in various ways including managing depression and anxiety as well as encouraging healthy sleep habits.     Therapeutic Goals: Patients will explore the impact of different varieties of music on mood Patients will verbalize the thoughts they have when listening to different types of music Patients will identify music that is soothing to them as well as music that is energizing to them Patients will discuss how to use this knowledge to assist in maintaining wellness and recovery Patients will explore the use of music as a coping skill   Summary of Patient Progress:   Patient was present in group, however did not engage in group discussions.  Patient was appropriate though quiet.    Therapeutic Modalities: Solution Focused Brief Therapy Activity  Claudie Fisherman 03/25/2022  3:46 PM

## 2022-03-25 NOTE — Plan of Care (Signed)
  Problem: Education: Goal: Knowledge of General Education information will improve Description: Including pain rating scale, medication(s)/side effects and non-pharmacologic comfort measures Outcome: Progressing   Problem: Health Behavior/Discharge Planning: Goal: Ability to manage health-related needs will improve Outcome: Progressing   Problem: Clinical Measurements: Goal: Ability to maintain clinical measurements within normal limits will improve Outcome: Progressing Goal: Will remain free from infection Outcome: Progressing Goal: Diagnostic test results will improve Outcome: Progressing Goal: Respiratory complications will improve Outcome: Progressing Goal: Cardiovascular complication will be avoided Outcome: Progressing   Problem: Nutrition: Goal: Adequate nutrition will be maintained Outcome: Progressing   Problem: Skin Integrity: Goal: Risk for impaired skin integrity will decrease Outcome: Progressing   Problem: Safety: Goal: Ability to remain free from injury will improve Outcome: Progressing   Problem: Pain Managment: Goal: General experience of comfort will improve Outcome: Progressing

## 2022-03-25 NOTE — Progress Notes (Signed)
[facility] MD Progress Note  03/25/2022 4:21 PM Amy Casey  MRN:  [id number] Subjective: Follow-up for this 61 year old woman with schizophrenia.  Patient seen and chart reviewed.  Also spoke to the patient's husband on the telephone.  Patient states she is feeling better but she also describes herself as feeling "so-so".  She says that things are better but she is still nervous and has some trouble feeling calm.  She has been participating in groups and calm all day.  Slept adequately at night.  I spoke with her husband who says that she is greatly improved and he feels like she is probably after nearly at her baseline and would welcome her home. Principal Problem: Schizophrenia, undifferentiated (HCC) Diagnosis: Principal Problem:   Schizophrenia, undifferentiated (HCC)  Total Time spent with patient: 30 minutes  Past Psychiatric History: Past history of schizophrenia  Past Medical History:  Past Medical History:  Diagnosis Date   Depression    History of echocardiogram 2011   [facility]   History of renal stone    Hyperlipidemia    Hypertension    Hypokalemia    Nephrolithiasis    Obesity    Schizophrenia (HCC)    Wears glasses     Past Surgical History:  Procedure Laterality Date   LUMBAR PUNCTURE  2015   WISDOM TOOTH EXTRACTION     Family History:  Family History  Problem Relation Age of Onset   Diabetes Mother    Heart disease Mother    Chronic Renal Failure Mother    Cancer Maternal Uncle        lung   Kidney disease Mother        dialysis   Stroke Neg Hx    Hypertension Neg Hx    Hyperlipidemia Neg Hx    Family Psychiatric  History: See previous Social History:  Social History   Substance and Sexual Activity  Alcohol Use No     Social History   Substance and Sexual Activity  Drug Use No    Social History   Socioeconomic History   Marital status: Married    Spouse name: Not on file   Number of children: 2   Years of education: 12   Highest education  level: Not on file  Occupational History   Not on file  Tobacco Use   Smoking status: Never   Smokeless tobacco: Never  Vaping Use   Vaping Use: Never used  Substance and Sexual Activity   Alcohol use: No   Drug use: No   Sexual activity: Not Currently  Other Topics Concern   Not on file  Social History Narrative      Married, lives at home with 2 grandchildren, mother in law lives with them, works at [employer].   Exercise - some walking.   12/2020   Social Determinants of Health   Financial Resource Strain: Not on file  Food Insecurity: No Food Insecurity (03/17/2022)   Hunger Vital Sign    Worried About Running Out of Food in the Last Year: Never true    Ran Out of Food in the Last Year: Never true  Transportation Needs: No Transportation Needs (03/17/2022)   PRAPARE - Administrator, Civil Service (Medical): No    Lack of Transportation (Non-Medical): No  Physical Activity: Not on file  Stress: Not on file  Social Connections: Not on file   Additional Social History:  Sleep: Fair  Appetite:  Fair  Current Medications: Current Facility-Administered Medications  Medication Dose Route Frequency Provider Last Rate Last Admin   acetaminophen (TYLENOL) tablet 650 mg  650 mg Oral Q6H PRN Edker Punt, Jackquline Denmark, MD       alum & mag hydroxide-simeth (MAALOX/MYLANTA) 200-200-20 MG/5ML suspension 30 mL  30 mL Oral Q4H PRN Tieara Flitton, Jackquline Denmark, MD       amLODipine (NORVASC) tablet 10 mg  10 mg Oral Daily Teagyn Fishel, Jackquline Denmark, MD   10 mg at 03/25/22 0941   benztropine (COGENTIN) tablet 1 mg  1 mg Oral Q6H PRN Lillard Bailon, Jackquline Denmark, MD       Or   benztropine mesylate (COGENTIN) injection 0.5 mg  0.5 mg Intramuscular Q6H PRN Layani Foronda, Jackquline Denmark, MD       bisacodyl (DULCOLAX) EC tablet 5 mg  5 mg Oral Daily PRN Barri Neidlinger, Jackquline Denmark, MD       diltiazem (CARDIZEM) injection 10 mg  10 mg Intravenous Q6H PRN Donte Kary T, MD       feeding supplement (ENSURE ENLIVE /  ENSURE PLUS) liquid 237 mL  237 mL Oral BID BM Hades Mathew T, MD   237 mL at 03/24/22 0904   haloperidol (HALDOL) tablet 2 mg  2 mg Oral Q6H PRN Lean Jaeger T, MD       Or   haloperidol lactate (HALDOL) injection 2 mg  2 mg Intramuscular Q6H PRN Tionne Dayhoff T, MD       LORazepam (ATIVAN) tablet 0.5 mg  0.5 mg Oral BH-[id number] Sarina Ill, DO   0.5 mg at 03/25/22 0941   magnesium hydroxide (MILK OF MAGNESIA) suspension 30 mL  30 mL Oral Daily PRN Neeko Pharo T, MD       metoprolol tartrate (LOPRESSOR) tablet 50 mg  50 mg Oral BID Estee Yohe, Jackquline Denmark, MD   50 mg at 03/25/22 0941   multivitamin with minerals tablet 1 tablet  1 tablet Oral Daily Arianni Gallego, Jackquline Denmark, MD   1 tablet at 03/25/22 0941   OLANZapine zydis (ZYPREXA) disintegrating tablet 5 mg  5 mg Oral Delana Meyer, MD   5 mg at 03/25/22 0941   Or   OLANZapine (ZYPREXA) injection 5 mg  5 mg Intramuscular Delana Meyer, MD       polyethylene glycol (MIRALAX / GLYCOLAX) packet 17 g  17 g Oral Daily PRN Conard Alvira, Jackquline Denmark, MD        Lab Results: No results found for this or any previous visit (from the past 48 hour(s)).  Blood Alcohol level:  Lab Results  Component Value Date   ETH <10 01/04/2021   ETH <10 01/01/2020    Metabolic Disorder Labs: Lab Results  Component Value Date   HGBA1C 6.5 (H) 03/18/2022   MPG 140 03/18/2022   MPG 119.76 04/20/2019   No results found for: "PROLACTIN" Lab Results  Component Value Date   CHOL 172 03/18/2022   TRIG 111 03/18/2022   HDL 30 (L) 03/18/2022   CHOLHDL 5.7 03/18/2022   VLDL 22 03/18/2022   LDLCALC 120 (H) 03/18/2022   LDLCALC 119 (H) 12/31/2020    Physical Findings: AIMS:  , ,  ,  ,    CIWA:    COWS:     Musculoskeletal: Strength & Muscle Tone: within normal limits Gait & Station: normal Patient leans: N/A  Psychiatric Specialty Exam:  Presentation  General Appearance:  Appropriate for Environment; Casual  Eye Contact: Fair  Speech: --  (a little easier to understand when speaking ot me. mumbles incoherent.)  Speech Volume: Normal  Handedness:No data recorded  Mood and Affect  Mood: -- (Afraid)  Affect: Restricted   Thought Process  Thought Processes: Disorganized  Descriptions of Associations:Intact  Orientation:Full (Time, Place and Person)  Thought Content:Paranoid Ideation  History of Schizophrenia/Schizoaffective disorder:Yes  Duration of Psychotic Symptoms:Greater than six months  Hallucinations:No data recorded Ideas of Reference:None  Suicidal Thoughts:No data recorded Homicidal Thoughts:No data recorded  Sensorium  Memory: Immediate Fair; Recent Fair; Remote Fair  Judgment: Impaired  Insight: Poor   Executive Functions  Concentration: Poor  Attention Span: Poor  Recall: [employer] of Knowledge: Fair  Language: Fair   Psychomotor Activity  Psychomotor Activity:No data recorded  Assets  Assets: Financial Resources/Insurance; Social Support; Intimacy; Housing   Sleep  Sleep:No data recorded   Physical Exam: Physical Exam Vitals reviewed.  Constitutional:      Appearance: Normal appearance.  HENT:     Head: Normocephalic and atraumatic.     Mouth/Throat:     Pharynx: Oropharynx is clear.  Eyes:     Pupils: Pupils are equal, round, and reactive to light.  Cardiovascular:     Rate and Rhythm: Normal rate and regular rhythm.  Pulmonary:     Effort: Pulmonary effort is normal.     Breath sounds: Normal breath sounds.  Abdominal:     General: Abdomen is flat.     Palpations: Abdomen is soft.  Musculoskeletal:        General: Normal range of motion.  Skin:    General: Skin is warm and dry.  Neurological:     General: No focal deficit present.     Mental Status: She is alert. Mental status is at baseline.  Psychiatric:        Attention and Perception: Attention normal.        Mood and Affect: Mood is anxious.        Speech: Speech normal.         Behavior: Behavior is cooperative.        Thought Content: Thought content normal.        Cognition and Memory: Cognition normal.    Review of Systems  Constitutional: Negative.   HENT: Negative.    Eyes: Negative.   Respiratory: Negative.    Cardiovascular: Negative.   Gastrointestinal: Negative.   Musculoskeletal: Negative.   Skin: Negative.   Neurological: Negative.   Psychiatric/Behavioral:  Negative for depression, hallucinations, substance abuse and suicidal ideas. The patient is nervous/anxious.    Blood pressure 110/73, pulse 75, temperature 98 F (36.7 C), temperature source Oral, resp. rate 18, height 5' 10.5" (1.791 m), weight 75 kg, SpO2 100 %. Body mass index is 23.39 kg/m.   Treatment Plan Summary: Medication management and Plan I agreed with the husband that she is doing much better.  Patient seems a little uncertain but said she might be willing to go home this weekend.  I suggested that both of them speak with Dr. Marlou Porch tomorrow and over the weekend as it is possible that he may be ready to discharge her sooner rather than later.  No change to medicine  Mordecai Rasmussen, MD 03/25/2022, 4:21 PM

## 2022-03-25 NOTE — Progress Notes (Signed)
   03/25/22 0730  Psych Admission Type (Psych Patients Only)  Admission Status Involuntary  Psychosocial Assessment  Patient Complaints None  Eye Contact Fair  Facial Expression Blank  Affect Appropriate to circumstance  Speech Logical/coherent  Interaction Assertive  Motor Activity Slow  Appearance/Hygiene In scrubs  Behavior Characteristics Cooperative;Calm  Mood Pleasant  Thought Process  Coherency WDL  Content WDL  Delusions None reported or observed  Perception WDL  Hallucination None reported or observed  Judgment Limited  Confusion Mild  Danger to Self  Current suicidal ideation? Denies  Danger to Others  Danger to Others None reported or observed

## 2022-03-26 DIAGNOSIS — F203 Undifferentiated schizophrenia: Secondary | ICD-10-CM | POA: Diagnosis not present

## 2022-03-26 NOTE — Progress Notes (Signed)
   03/26/22 0800  Psych Admission Type (Psych Patients Only)  Admission Status Involuntary  Psychosocial Assessment  Patient Complaints None  Eye Contact Fair  Facial Expression Blank;Flat  Affect Appropriate to circumstance  Speech Logical/coherent;Soft  Interaction Assertive  Motor Activity Slow  Appearance/Hygiene Unremarkable  Behavior Characteristics Cooperative  Mood Pleasant  Thought Process  Coherency WDL  Content WDL  Delusions None reported or observed  Perception WDL  Hallucination None reported or observed  Judgment Impaired  Confusion Mild  Danger to Self  Current suicidal ideation? Denies  Danger to Others  Danger to Others None reported or observed

## 2022-03-26 NOTE — Progress Notes (Signed)
[facility] MD Progress Note  03/26/2022 1:09 PM Amy Casey  MRN:  [id number] Subjective: Formerly seen on rounds.  She is doing much better than when I last saw her.  She has been compliant with medications.  Will go ahead and discontinue all her PRNs that she does not need them.  No complaints.  She asked about when she can go.  I told open social work comes back.  We will talk about it. Principal Problem: Schizophrenia, undifferentiated ([place]) Diagnosis: Principal Problem:   Schizophrenia, undifferentiated ([place])  Total Time spent with patient: 15 minutes  Past Psychiatric History: History of noncompliance of schizophrenia.  Past Medical History:  Past Medical History:  Diagnosis Date   Depression    History of echocardiogram 2011   [facility]   History of renal stone    Hyperlipidemia    Hypertension    Hypokalemia    Nephrolithiasis    Obesity    Schizophrenia ([place])    Wears glasses     Past Surgical History:  Procedure Laterality Date   LUMBAR PUNCTURE  2015   WISDOM TOOTH EXTRACTION     Family History:  Family History  Problem Relation Age of Onset   Diabetes Mother    Heart disease Mother    Chronic Renal Failure Mother    Cancer Maternal Uncle        lung   Kidney disease Mother        dialysis   Stroke Neg Hx    Hypertension Neg Hx    Hyperlipidemia Neg Hx    Family Psychiatric  History: Unremarkable Social History:  Social History   Substance and Sexual Activity  Alcohol Use No     Social History   Substance and Sexual Activity  Drug Use No    Social History   Socioeconomic History   Marital status: Married    Spouse name: Not on file   Number of children: 2   Years of education: 12   Highest education level: Not on file  Occupational History   Not on file  Tobacco Use   Smoking status: Never   Smokeless tobacco: Never  Vaping Use   Vaping Use: Never used  Substance and Sexual Activity   Alcohol use: No   Drug use: No   Sexual activity: Not  Currently  Other Topics Concern   Not on file  Social History Narrative      Married, lives at home with 2 grandchildren, mother in law lives with them, works at [employer].   Exercise - some walking.   12/2020   Social Determinants of Health   Financial Resource Strain: Not on file  Food Insecurity: No Food Insecurity (03/17/2022)   Hunger Vital Sign    Worried About Running Out of Food in the Last Year: Never true    Ran Out of Food in the Last Year: Never true  Transportation Needs: No Transportation Needs (03/17/2022)   PRAPARE - Hydrologist (Medical): No    Lack of Transportation (Non-Medical): No  Physical Activity: Not on file  Stress: Not on file  Social Connections: Not on file   Additional Social History:                         Sleep: Good  Appetite:  Good  Current Medications: Current Facility-Administered Medications  Medication Dose Route Frequency Provider Last Rate Last Admin   acetaminophen (TYLENOL) tablet 650 mg  650 mg Oral Q6H PRN Clapacs, Jackquline Denmark, MD       alum & mag hydroxide-simeth (MAALOX/MYLANTA) 200-200-20 MG/5ML suspension 30 mL  30 mL Oral Q4H PRN Clapacs, Jackquline Denmark, MD       amLODipine (NORVASC) tablet 10 mg  10 mg Oral Daily Clapacs, Jackquline Denmark, MD   10 mg at 03/26/22 0830   benztropine (COGENTIN) tablet 1 mg  1 mg Oral Q6H PRN Clapacs, Jackquline Denmark, MD       Or   benztropine mesylate (COGENTIN) injection 0.5 mg  0.5 mg Intramuscular Q6H PRN Clapacs, Jackquline Denmark, MD       bisacodyl (DULCOLAX) EC tablet 5 mg  5 mg Oral Daily PRN Clapacs, Jackquline Denmark, MD       diltiazem (CARDIZEM) injection 10 mg  10 mg Intravenous Q6H PRN Clapacs, Jackquline Denmark, MD       feeding supplement (ENSURE ENLIVE / ENSURE PLUS) liquid 237 mL  237 mL Oral BID BM Clapacs, John T, MD   237 mL at 03/24/22 0904   haloperidol (HALDOL) tablet 2 mg  2 mg Oral Q6H PRN Clapacs, Jackquline Denmark, MD       Or   haloperidol lactate (HALDOL) injection 2 mg  2 mg Intramuscular Q6H PRN  Clapacs, John T, MD       LORazepam (ATIVAN) tablet 0.5 mg  0.5 mg Oral [id number] Sarina Ill, DO   0.5 mg at 03/26/22 0831   magnesium hydroxide (MILK OF MAGNESIA) suspension 30 mL  30 mL Oral Daily PRN Clapacs, Jackquline Denmark, MD       metoprolol tartrate (LOPRESSOR) tablet 50 mg  50 mg Oral BID Clapacs, John T, MD   50 mg at 03/26/22 0830   multivitamin with minerals tablet 1 tablet  1 tablet Oral Daily Clapacs, John T, MD   1 tablet at 03/26/22 0830   OLANZapine zydis (ZYPREXA) disintegrating tablet 5 mg  5 mg Oral Delana Meyer, MD   5 mg at 03/26/22 0831   Or   OLANZapine (ZYPREXA) injection 5 mg  5 mg Intramuscular Delana Meyer, MD       polyethylene glycol (MIRALAX / GLYCOLAX) packet 17 g  17 g Oral Daily PRN Clapacs, Jackquline Denmark, MD        Lab Results: No results found for this or any previous visit (from the past 48 hour(s)).  Blood Alcohol level:  Lab Results  Component Value Date   ETH <10 01/04/2021   ETH <10 01/01/2020    Metabolic Disorder Labs: Lab Results  Component Value Date   HGBA1C 6.5 (H) 03/18/2022   MPG 140 03/18/2022   MPG 119.76 04/20/2019   No results found for: "PROLACTIN" Lab Results  Component Value Date   CHOL 172 03/18/2022   TRIG 111 03/18/2022   HDL 30 (L) 03/18/2022   CHOLHDL 5.7 03/18/2022   VLDL 22 03/18/2022   LDLCALC 120 (H) 03/18/2022   LDLCALC 119 (H) 12/31/2020    Physical Findings: AIMS:  , ,  ,  ,    CIWA:    COWS:     Musculoskeletal: Strength & Muscle Tone: within normal limits Gait & Station: normal Patient leans: N/A  Psychiatric Specialty Exam:  Presentation  General Appearance:  Appropriate for Environment; Casual  Eye Contact: Fair  Speech: -- (a little easier to understand when speaking ot me. mumbles incoherent.)  Speech Volume: Normal  Handedness:No data recorded  Mood and Affect  Mood: -- (Afraid)  Affect: Restricted   Thought Process  Thought  Processes: Disorganized  Descriptions of Associations:Intact  Orientation:Full (Time, Place and Person)  Thought Content:Paranoid Ideation  History of Schizophrenia/Schizoaffective disorder:Yes  Duration of Psychotic Symptoms:Greater than six months  Hallucinations:No data recorded Ideas of Reference:None  Suicidal Thoughts:No data recorded Homicidal Thoughts:No data recorded  Sensorium  Memory: Immediate Fair; Recent Fair; Remote Fair  Judgment: Impaired  Insight: Poor   Executive Functions  Concentration: Poor  Attention Span: Poor  Recall: [employer] of Knowledge: Fair  Language: Fair   Psychomotor Activity  Psychomotor Activity:No data recorded  Assets  Assets: Financial Resources/Insurance; Social Support; Intimacy; Housing   Sleep  Sleep:No data recorded   Physical Exam: Physical Exam negative ROS negative Blood pressure 111/73, pulse 84, temperature 97.8 F (36.6 C), resp. rate 18, height 5' 10.5" (1.791 m), weight 75 kg, SpO2 100 %. Body mass index is 23.39 kg/m.   Treatment Plan Summary: Daily contact with patient to assess and evaluate symptoms and progress in treatment, Medication management, and Plan discontinue as needed medications.  Continue Zyprexa.  Parks Ranger, DO 03/26/2022, 1:09 PM

## 2022-03-26 NOTE — Progress Notes (Signed)
Patient attended future planning group and engaged in arts and crafts making a vision board for the New Year.  

## 2022-03-26 NOTE — BH IP Treatment Plan (Signed)
Interdisciplinary Treatment and Diagnostic Plan Update  03/26/2022 Time of Session: [place] MRN: [id number]  Principal Diagnosis: Schizophrenia, undifferentiated ([place])  Secondary Diagnoses: Principal Problem:   Schizophrenia, undifferentiated ([place])   Current Medications:  Current Facility-Administered Medications  Medication Dose Route Frequency Provider Last Rate Last Admin   acetaminophen (TYLENOL) tablet 650 mg  650 mg Oral Q6H PRN Clapacs, Madie Reno, MD       alum & mag hydroxide-simeth (MAALOX/MYLANTA) 200-200-20 MG/5ML suspension 30 mL  30 mL Oral Q4H PRN Clapacs, Madie Reno, MD       amLODipine (NORVASC) tablet 10 mg  10 mg Oral Daily Clapacs, Madie Reno, MD   10 mg at 03/26/22 0830   benztropine (COGENTIN) tablet 1 mg  1 mg Oral Q6H PRN Clapacs, Madie Reno, MD       Or   benztropine mesylate (COGENTIN) injection 0.5 mg  0.5 mg Intramuscular Q6H PRN Clapacs, Madie Reno, MD       bisacodyl (DULCOLAX) EC tablet 5 mg  5 mg Oral Daily PRN Clapacs, Madie Reno, MD       diltiazem (CARDIZEM) injection 10 mg  10 mg Intravenous Q6H PRN Clapacs, Madie Reno, MD       feeding supplement (ENSURE ENLIVE / ENSURE PLUS) liquid 237 mL  237 mL Oral BID BM Clapacs, John T, MD   237 mL at 03/24/22 0904   haloperidol (HALDOL) tablet 2 mg  2 mg Oral Q6H PRN Clapacs, Madie Reno, MD       Or   haloperidol lactate (HALDOL) injection 2 mg  2 mg Intramuscular Q6H PRN Clapacs, John T, MD       LORazepam (ATIVAN) tablet 0.5 mg  0.5 mg Oral [id number] Parks Ranger, DO   0.5 mg at 03/26/22 0831   magnesium hydroxide (MILK OF MAGNESIA) suspension 30 mL  30 mL Oral Daily PRN Clapacs, Madie Reno, MD       metoprolol tartrate (LOPRESSOR) tablet 50 mg  50 mg Oral BID Clapacs, John T, MD   50 mg at 03/26/22 0830   multivitamin with minerals tablet 1 tablet  1 tablet Oral Daily Clapacs, John T, MD   1 tablet at 03/26/22 0830   OLANZapine zydis (ZYPREXA) disintegrating tablet 5 mg  5 mg Oral Gwenith Daily, MD   5 mg at  03/26/22 0831   Or   OLANZapine (ZYPREXA) injection 5 mg  5 mg Intramuscular Gwenith Daily, MD       polyethylene glycol (MIRALAX / GLYCOLAX) packet 17 g  17 g Oral Daily PRN Clapacs, Madie Reno, MD       PTA Medications: Medications Prior to Admission  Medication Sig Dispense Refill Last Dose   amLODipine (NORVASC) 10 MG tablet TAKE 1 TABLET BY MOUTH ONCE DAILY. APPOINTMENT REQUIRED FOR FUTURE REFILLS. (Patient taking differently: Take 10 mg by mouth in the morning.) 90 tablet 3    metoprolol tartrate (LOPRESSOR) 50 MG tablet Take 1 tablet (50 mg total) by mouth 2 (two) times daily.      OLANZapine (ZYPREXA) 10 MG tablet Take 1 tablet (10 mg total) by mouth at bedtime. 90 tablet 0    rosuvastatin (CRESTOR) 10 MG tablet Take 1 tablet (10 mg total) by mouth daily. 90 tablet 3    solifenacin (VESICARE) 10 MG tablet Take 1 tablet (10 mg total) by mouth daily. 30 tablet 2     Patient Stressors: Health problems   Medication change or noncompliance    Patient  Strengths: Supportive family/friends   Treatment Modalities: Medication Management, Group therapy, Case management,  1 to 1 session with clinician, Psychoeducation, Recreational therapy.   Physician Treatment Plan for Primary Diagnosis: Schizophrenia, undifferentiated (HCC) Long Term Goal(s): Improvement in symptoms so as ready for discharge   Short Term Goals: Ability to identify changes in lifestyle to reduce recurrence of condition will improve Ability to verbalize feelings will improve Ability to disclose and discuss suicidal ideas Ability to demonstrate self-control will improve Ability to identify and develop effective coping behaviors will improve Ability to maintain clinical measurements within normal limits will improve Compliance with prescribed medications will improve Ability to identify triggers associated with substance abuse/mental health issues will improve  Medication Management: Evaluate patient's response,  side effects, and tolerance of medication regimen.  Therapeutic Interventions: 1 to 1 sessions, Unit Group sessions and Medication administration.  Evaluation of Outcomes: Progressing  Physician Treatment Plan for Secondary Diagnosis: Principal Problem:   Schizophrenia, undifferentiated (HCC)  Long Term Goal(s): Improvement in symptoms so as ready for discharge   Short Term Goals: Ability to identify changes in lifestyle to reduce recurrence of condition will improve Ability to verbalize feelings will improve Ability to disclose and discuss suicidal ideas Ability to demonstrate self-control will improve Ability to identify and develop effective coping behaviors will improve Ability to maintain clinical measurements within normal limits will improve Compliance with prescribed medications will improve Ability to identify triggers associated with substance abuse/mental health issues will improve     Medication Management: Evaluate patient's response, side effects, and tolerance of medication regimen.  Therapeutic Interventions: 1 to 1 sessions, Unit Group sessions and Medication administration.  Evaluation of Outcomes: Progressing   RN Treatment Plan for Primary Diagnosis: Schizophrenia, undifferentiated (HCC) Long Term Goal(s): Knowledge of disease and therapeutic regimen to maintain health will improve  Short Term Goals: Ability to remain free from injury will improve, Ability to verbalize frustration and anger appropriately will improve, Ability to demonstrate self-control, Ability to participate in decision making will improve, Ability to verbalize feelings will improve, Ability to disclose and discuss suicidal ideas, Ability to identify and develop effective coping behaviors will improve, and Compliance with prescribed medications will improve  Medication Management: RN will administer medications as ordered by provider, will assess and evaluate patient's response and provide  education to patient for prescribed medication. RN will report any adverse and/or side effects to prescribing provider.  Therapeutic Interventions: 1 on 1 counseling sessions, Psychoeducation, Medication administration, Evaluate responses to treatment, Monitor vital signs and CBGs as ordered, Perform/monitor CIWA, COWS, AIMS and Fall Risk screenings as ordered, Perform wound care treatments as ordered.  Evaluation of Outcomes: Progressing   LCSW Treatment Plan for Primary Diagnosis: Schizophrenia, undifferentiated (HCC) Long Term Goal(s): Safe transition to appropriate next level of care at discharge, Engage patient in therapeutic group addressing interpersonal concerns.  Short Term Goals: Engage patient in aftercare planning with referrals and resources, Increase social support, Increase ability to appropriately verbalize feelings, Increase emotional regulation, Facilitate acceptance of mental health diagnosis and concerns, Facilitate patient progression through stages of change regarding substance use diagnoses and concerns, Identify triggers associated with mental health/substance abuse issues, and Increase skills for wellness and recovery  Therapeutic Interventions: Assess for all discharge needs, 1 to 1 time with Social worker, Explore available resources and support systems, Assess for adequacy in community support network, Educate family and significant other(s) on suicide prevention, Complete Psychosocial Assessment, Interpersonal group therapy.  Evaluation of Outcomes: Progressing   Progress in Treatment:  Attending groups: Yes. Participating in groups: Yes. Taking medication as prescribed: Yes. Toleration medication: Yes. Family/Significant other contact made: Yes, individual(s) contacted:  Braulio Conte, husband, [phone] Patient understands diagnosis: Yes. Discussing patient identified problems/goals with staff: Yes. Medical problems stabilized or resolved: Yes. Denies  suicidal/homicidal ideation: Yes. Issues/concerns per patient self-inventory: Yes. Other:   New problem(s) identified: No, Describe:  None Reported  New Short Term/Long Term Goal(s):Medication Stabilization  Patient Goals:  Coping Skills  Discharge Plan or Barriers: None Reported  Reason for Continuation of Hospitalization: Delusions  Depression Hallucinations Medication stabilization  Estimated Length of Stay: 3-7 Days  Last [place] Suicide Severity Risk Score: Flowsheet Row Admission (Current) from 03/17/2022 in [place] ED to Hosp-Admission (Discharged) from 03/12/2022 in [facility] from 03/11/2022 in [place] No Risk No Risk No Risk       Last PHQ 2/9 Scores:    05/25/2021   10:00 AM 02/23/2021    1:47 PM 02/23/2021    1:46 PM  Depression screen PHQ 2/9  Decreased Interest 0 0 0  Down, Depressed, Hopeless 0 0 0  PHQ - 2 Score 0 0 0  Altered sleeping  0   Tired, decreased energy  0   Change in appetite  1   Feeling bad or failure about yourself   0   Trouble concentrating  1   Moving slowly or fidgety/restless  0   Suicidal thoughts  0   PHQ-9 Score  2   Difficult doing work/chores  Not difficult at all      medication stabilization, elimination of SI thoughts, development of comprehensive mental wellness plan.   Scribe for Treatment Team: Windle Guard, LCSW 03/26/2022 12:47 PM

## 2022-03-26 NOTE — Plan of Care (Signed)

## 2022-03-26 NOTE — Progress Notes (Addendum)
   03/26/22 1940  Psych Admission Type (Psych Patients Only)  Admission Status Involuntary  Psychosocial Assessment  Patient Complaints None  Eye Contact Fair  Facial Expression Flat  Affect Appropriate to circumstance  Speech Logical/coherent;Soft  Interaction Assertive  Motor Activity Slow  Appearance/Hygiene Unremarkable  Behavior Characteristics Cooperative  Mood Pleasant  Thought Process  Coherency WDL  Content WDL  Delusions None reported or observed  Perception WDL  Hallucination None reported or observed  Judgment Impaired  Confusion Mild  Danger to Self  Current suicidal ideation? Denies  Danger to Others  Danger to Others None reported or observed   Progress note   D: Pt seen at nurse's station. Pt denies SI, HI, AVH. 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 Pt rates anxiety  0/10 and depression  0/10. Pt is pleasant and alert and oriented. Patient visiting with her son. Pt endorsed urinary frequency. "I had it before and they were giving me medicine but I could manage. Now, if I even turn to the side, I've gone (urinated) before I could move. That's why I'm wearing the briefs. It seems it's gotten worse." Pt was taking Vesicare for urinary frequency PTA. Pt also stated that she had been jittery from her medications but says it is improving. No other concerns noted at this time.  A: Pt provided support and encouragement. Pt given scheduled medication as prescribed. PRNs as appropriate. Q15 min checks for safety.   R: Pt safe on the unit. Will continue to monitor.

## 2022-03-26 NOTE — Progress Notes (Signed)
Patient is alert and oriented times 2. Mild confusion noted. Mood and affect anxious, guarded, and paranoid. Patient denies pain. Speech is soft but logical. States she slept good last night. Morning meds given whole by mouth W/O difficulty. Patient refused breakfast. Patient remains on unit with Q15 minute checks in place.

## 2022-03-26 NOTE — Group Note (Unsigned)
LCSW Group Therapy Note  Group Date: 03/26/2022 Start Time: 1300 End Time: 1400   Type of Therapy and Topic:  Group Therapy: Positive Affirmations  Participation Level:  {[facility] PARTICIPATION LEVEL:22264}   Description of Group:   This group addressed positive affirmation towards self and others.  Patients went around the room and identified two positive things about themselves and two positive things about a peer in the room.  Patients reflected on how it felt to share something positive with others, to identify positive things about themselves, and to hear positive things from others/ Patients were encouraged to have a daily reflection of positive characteristics or circumstances.   Therapeutic Goals: 1. Patients will verbalize two of their positive qualities 2. Patients will demonstrate empathy for others by stating two positive qualities about a peer in the group 3. Patients will verbalize their feelings when voicing positive self affirmations and when voicing positive affirmations of others 4. Patients will discuss the potential positive impact on their wellness/recovery of focusing on positive traits of self and others.  Summary of Patient Progress:  *** actively engaged in the discussion and . S*** was able***or not able to identify positive affirmations about ***self as well as other group members. Patient demonstrated *** insight into the subject matter, was respectful of peers, participated throughout the entire session.  Therapeutic Modalities:   Cognitive Behavioral Therapy Motivational Interviewing    Jaye Saal S Galya Dunnigan, LCSWA 03/26/2022  2:53 PM    

## 2022-03-27 DIAGNOSIS — F203 Undifferentiated schizophrenia: Secondary | ICD-10-CM | POA: Diagnosis not present

## 2022-03-27 MED ORDER — OLANZAPINE 5 MG PO TBDP
10.0000 mg | ORAL_TABLET | ORAL | Status: DC
  Administered 2022-03-27 – 2022-03-28 (×2): 10 mg via ORAL
  Filled 2022-03-27 (×2): qty 2

## 2022-03-27 NOTE — Plan of Care (Signed)

## 2022-03-27 NOTE — Progress Notes (Signed)
[facility] MD Progress Note  03/27/2022 11:13 AM Amy Casey  MRN:  [id number] Subjective: Amy Casey has been compliant with medications.  No side effects.  She denies any depression or suicidal ideation.  Her thought process is much more goal directed.  I talked to her about a long-acting injection and she said that she would rather take the medication because she does not remember that she does not take her medicine at home. Principal Problem: Schizophrenia, undifferentiated (HCC) Diagnosis: Principal Problem:   Schizophrenia, undifferentiated (HCC)  Total Time spent with patient: 15 minutes  Past Psychiatric History: Schizophrenia and noncompliance  Past Medical History:  Past Medical History:  Diagnosis Date   Depression    History of echocardiogram 2011   [facility]   History of renal stone    Hyperlipidemia    Hypertension    Hypokalemia    Nephrolithiasis    Obesity    Schizophrenia (HCC)    Wears glasses     Past Surgical History:  Procedure Laterality Date   LUMBAR PUNCTURE  2015   WISDOM TOOTH EXTRACTION     Family History:  Family History  Problem Relation Age of Onset   Diabetes Mother    Heart disease Mother    Chronic Renal Failure Mother    Cancer Maternal Uncle        lung   Kidney disease Mother        dialysis   Stroke Neg Hx    Hypertension Neg Hx    Hyperlipidemia Neg Hx    Family Psychiatric  History: Unremarkable Social History:  Social History   Substance and Sexual Activity  Alcohol Use No     Social History   Substance and Sexual Activity  Drug Use No    Social History   Socioeconomic History   Marital status: Married    Spouse name: Not on file   Number of children: 2   Years of education: 12   Highest education level: Not on file  Occupational History   Not on file  Tobacco Use   Smoking status: Never   Smokeless tobacco: Never  Vaping Use   Vaping Use: Never used  Substance and Sexual Activity   Alcohol use: No   Drug use:  No   Sexual activity: Not Currently  Other Topics Concern   Not on file  Social History Narrative      Married, lives at home with 2 grandchildren, mother in law lives with them, works at [employer].   Exercise - some walking.   12/2020   Social Determinants of Health   Financial Resource Strain: Not on file  Food Insecurity: No Food Insecurity (03/17/2022)   Hunger Vital Sign    Worried About Running Out of Food in the Last Year: Never true    Ran Out of Food in the Last Year: Never true  Transportation Needs: No Transportation Needs (03/17/2022)   PRAPARE - Administrator, Civil Service (Medical): No    Lack of Transportation (Non-Medical): No  Physical Activity: Not on file  Stress: Not on file  Social Connections: Not on file   Additional Social History:                         Sleep: Good  Appetite:  Good  Current Medications: Current Facility-Administered Medications  Medication Dose Route Frequency Provider Last Rate Last Admin   acetaminophen (TYLENOL) tablet 650 mg  650 mg Oral Q6H PRN  Clapacs, Jackquline Denmark, MD       alum & mag hydroxide-simeth (MAALOX/MYLANTA) 200-200-20 MG/5ML suspension 30 mL  30 mL Oral Q4H PRN Clapacs, Jackquline Denmark, MD       amLODipine (NORVASC) tablet 10 mg  10 mg Oral Daily Clapacs, John T, MD   10 mg at 03/27/22 0915   bisacodyl (DULCOLAX) EC tablet 5 mg  5 mg Oral Daily PRN Clapacs, Jackquline Denmark, MD       diltiazem (CARDIZEM) injection 10 mg  10 mg Intravenous Q6H PRN Clapacs, John T, MD       feeding supplement (ENSURE ENLIVE / ENSURE PLUS) liquid 237 mL  237 mL Oral BID BM Clapacs, John T, MD   237 mL at 03/24/22 0904   LORazepam (ATIVAN) tablet 0.5 mg  0.5 mg Oral [id number] Sarina Ill, DO   0.5 mg at 03/27/22 0915   magnesium hydroxide (MILK OF MAGNESIA) suspension 30 mL  30 mL Oral Daily PRN Clapacs, Jackquline Denmark, MD       metoprolol tartrate (LOPRESSOR) tablet 50 mg  50 mg Oral BID Clapacs, John T, MD   50 mg at 03/27/22 0915    multivitamin with minerals tablet 1 tablet  1 tablet Oral Daily Clapacs, John T, MD   1 tablet at 03/27/22 0915   OLANZapine zydis (ZYPREXA) disintegrating tablet 10 mg  10 mg Oral [id number] Derwin Reddy Edward, DO       polyethylene glycol (MIRALAX / GLYCOLAX) packet 17 g  17 g Oral Daily PRN Clapacs, Jackquline Denmark, MD        Lab Results: No results found for this or any previous visit (from the past 48 hour(s)).  Blood Alcohol level:  Lab Results  Component Value Date   ETH <10 01/04/2021   ETH <10 01/01/2020    Metabolic Disorder Labs: Lab Results  Component Value Date   HGBA1C 6.5 (H) 03/18/2022   MPG 140 03/18/2022   MPG 119.76 04/20/2019   No results found for: "PROLACTIN" Lab Results  Component Value Date   CHOL 172 03/18/2022   TRIG 111 03/18/2022   HDL 30 (L) 03/18/2022   CHOLHDL 5.7 03/18/2022   VLDL 22 03/18/2022   LDLCALC 120 (H) 03/18/2022   LDLCALC 119 (H) 12/31/2020    Physical Findings: AIMS:  , ,  ,  ,    CIWA:    COWS:     Musculoskeletal: Strength & Muscle Tone: within normal limits Gait & Station: normal Patient leans: N/A  Psychiatric Specialty Exam:  Presentation  General Appearance:  Appropriate for Environment; Casual  Eye Contact: Fair  Speech: -- (a little easier to understand when speaking ot me. mumbles incoherent.)  Speech Volume: Normal  Handedness:No data recorded  Mood and Affect  Mood: -- (Afraid)  Affect: Restricted   Thought Process  Thought Processes: Disorganized  Descriptions of Associations:Intact  Orientation:Full (Time, Place and Person)  Thought Content:Paranoid Ideation  History of Schizophrenia/Schizoaffective disorder:Yes  Duration of Psychotic Symptoms:Greater than six months  Hallucinations:No data recorded Ideas of Reference:None  Suicidal Thoughts:No data recorded Homicidal Thoughts:No data recorded  Sensorium  Memory: Immediate Fair; Recent Fair; Remote  Fair  Judgment: Impaired  Insight: Poor   Executive Functions  Concentration: Poor  Attention Span: Poor  Recall: [employer] of Knowledge: Fair  Language: Fair   Psychomotor Activity  Psychomotor Activity:No data recorded  Assets  Assets: Financial Resources/Insurance; Social Support; Intimacy; Housing   Sleep  Sleep:No data recorded   Physical  Exam: Physical Exam negative ROS negative Blood pressure 121/72, pulse 79, temperature 98.1 F (36.7 C), temperature source Oral, resp. rate 18, height 5' 10.5" (1.791 m), weight 75 kg, SpO2 98 %. Body mass index is 23.39 kg/m.   Treatment Plan Summary: Daily contact with patient to assess and evaluate symptoms and progress in treatment, Medication management, and Plan change Zyprexa to 10 mg at bedtime.  Ozro Russett Tresea Mall, DO 03/27/2022, 11:13 AM

## 2022-03-27 NOTE — Progress Notes (Signed)
Patient attended music therapy group and actively engaged in discussion on how music can be used as a coping skill and way to express yourself. She shared that music is "peaceful and calming". She identified the song "look in the eyes of my heart Lord". She shared, "I use to listen to this song every Sunday when I go to church".

## 2022-03-28 DIAGNOSIS — F203 Undifferentiated schizophrenia: Secondary | ICD-10-CM | POA: Diagnosis not present

## 2022-03-28 MED ORDER — OLANZAPINE 5 MG PO TABS
10.0000 mg | ORAL_TABLET | Freq: Every day | ORAL | Status: DC
  Administered 2022-03-28 – 2022-03-29 (×2): 10 mg via ORAL
  Filled 2022-03-28 (×2): qty 2

## 2022-03-28 MED ORDER — LORAZEPAM 0.5 MG PO TABS
0.5000 mg | ORAL_TABLET | Freq: Every day | ORAL | Status: DC
  Administered 2022-03-29: 0.5 mg via ORAL
  Filled 2022-03-28: qty 1

## 2022-03-28 NOTE — Plan of Care (Signed)
D- Patient alert and oriented. Patient presents with a pleasant mood and affect. Patient denies SI, HI, AVH, and pain.   A- Scheduled medications administered to patient, per MD orders. Support and encouragement provided.  Routine safety checks conducted every 15 minutes.  Patient informed to notify staff with problems or concerns.  R- No adverse drug reactions noted. Patient contracts for safety at this time. Patient compliant with medications and treatment plan. Patient receptive, calm, and cooperative. Patient interacts well with others on the unit.  Patient remains safe at this time.  Problem: Education: Goal: Knowledge of General Education information will improve Description: Including pain rating scale, medication(s)/side effects and non-pharmacologic comfort measures Outcome: Progressing   Problem: Health Behavior/Discharge Planning: Goal: Ability to manage health-related needs will improve Outcome: Progressing   Problem: Clinical Measurements: Goal: Ability to maintain clinical measurements within normal limits will improve Outcome: Progressing Goal: Will remain free from infection Outcome: Progressing Goal: Diagnostic test results will improve Outcome: Progressing Goal: Respiratory complications will improve Outcome: Progressing Goal: Cardiovascular complication will be avoided Outcome: Progressing   Problem: Activity: Goal: Risk for activity intolerance will decrease Outcome: Progressing   Problem: Nutrition: Goal: Adequate nutrition will be maintained Outcome: Progressing   Problem: Coping: Goal: Level of anxiety will decrease Outcome: Progressing   Problem: Elimination: Goal: Will not experience complications related to bowel motility Outcome: Progressing Goal: Will not experience complications related to urinary retention Outcome: Progressing   Problem: Pain Managment: Goal: General experience of comfort will improve Outcome: Progressing   Problem:  Safety: Goal: Ability to remain free from injury will improve Outcome: Progressing   Problem: Skin Integrity: Goal: Risk for impaired skin integrity will decrease Outcome: Progressing

## 2022-03-28 NOTE — Progress Notes (Signed)
[facility] MD Progress Note  03/28/2022 11:39 AM Amy Casey  MRN:  [id number] Subjective: Amy Casey is seen on rounds.  I explain her medications to her.  Yesterday her Zyprexa did not get changed before and changed to 10 mg at bedtime only to move his her Ativan to half milligram at bedtime only.  I also explained that she is on 2 blood pressure medicines in the morning.  I reassured her that she will have a printout of her medicine.  She is much improved.  No side effects from her medicine. Principal Problem: Schizophrenia, undifferentiated ([place]) Diagnosis: Principal Problem:   Schizophrenia, undifferentiated ([place])  Total Time spent with patient: 15 minutes  Past Psychiatric History: History of schizophrenia and noncompliance with medication.  Past Medical History:  Past Medical History:  Diagnosis Date   Depression    History of echocardiogram 2011   [facility]   History of renal stone    Hyperlipidemia    Hypertension    Hypokalemia    Nephrolithiasis    Obesity    Schizophrenia ([place])    Wears glasses     Past Surgical History:  Procedure Laterality Date   LUMBAR PUNCTURE  2015   WISDOM TOOTH EXTRACTION     Family History:  Family History  Problem Relation Age of Onset   Diabetes Mother    Heart disease Mother    Chronic Renal Failure Mother    Cancer Maternal Uncle        lung   Kidney disease Mother        dialysis   Stroke Neg Hx    Hypertension Neg Hx    Hyperlipidemia Neg Hx    Family Psychiatric  History: Unremarkable Social History:  Social History   Substance and Sexual Activity  Alcohol Use No     Social History   Substance and Sexual Activity  Drug Use No    Social History   Socioeconomic History   Marital status: Married    Spouse name: Not on file   Number of children: 2   Years of education: 12   Highest education level: Not on file  Occupational History   Not on file  Tobacco Use   Smoking status: Never   Smokeless tobacco: Never  Vaping  Use   Vaping Use: Never used  Substance and Sexual Activity   Alcohol use: No   Drug use: No   Sexual activity: Not Currently  Other Topics Concern   Not on file  Social History Narrative      Married, lives at home with 2 grandchildren, mother in law lives with them, works at [employer].   Exercise - some walking.   12/2020   Social Determinants of Health   Financial Resource Strain: Not on file  Food Insecurity: No Food Insecurity (03/17/2022)   Hunger Vital Sign    Worried About Running Out of Food in the Last Year: Never true    Ran Out of Food in the Last Year: Never true  Transportation Needs: No Transportation Needs (03/17/2022)   PRAPARE - Hydrologist (Medical): No    Lack of Transportation (Non-Medical): No  Physical Activity: Not on file  Stress: Not on file  Social Connections: Not on file   Additional Social History:                         Sleep: Good  Appetite:  Good  Current Medications: Current Facility-Administered  Medications  Medication Dose Route Frequency Provider Last Rate Last Admin   acetaminophen (TYLENOL) tablet 650 mg  650 mg Oral Q6H PRN Clapacs, John T, MD       amLODipine (NORVASC) tablet 10 mg  10 mg Oral Daily Clapacs, John T, MD   10 mg at 03/28/22 0935   bisacodyl (DULCOLAX) EC tablet 5 mg  5 mg Oral Daily PRN Clapacs, Jackquline Denmark, MD       Melene Muller ON 03/29/2022] LORazepam (ATIVAN) tablet 0.5 mg  0.5 mg Oral QHS Sarina Ill, DO       metoprolol tartrate (LOPRESSOR) tablet 50 mg  50 mg Oral BID Clapacs, Jackquline Denmark, MD   50 mg at 03/28/22 1610   multivitamin with minerals tablet 1 tablet  1 tablet Oral Daily Clapacs, Jackquline Denmark, MD   1 tablet at 03/28/22 0936   OLANZapine (ZYPREXA) tablet 10 mg  10 mg Oral QHS Sarina Ill, DO        Lab Results: No results found for this or any previous visit (from the past 48 hour(s)).  Blood Alcohol level:  Lab Results  Component Value Date   ETH <10  01/04/2021   ETH <10 01/01/2020    Metabolic Disorder Labs: Lab Results  Component Value Date   HGBA1C 6.5 (H) 03/18/2022   MPG 140 03/18/2022   MPG 119.76 04/20/2019   No results found for: "PROLACTIN" Lab Results  Component Value Date   CHOL 172 03/18/2022   TRIG 111 03/18/2022   HDL 30 (L) 03/18/2022   CHOLHDL 5.7 03/18/2022   VLDL 22 03/18/2022   LDLCALC 120 (H) 03/18/2022   LDLCALC 119 (H) 12/31/2020    Physical Findings: AIMS:  , ,  ,  ,    CIWA:    COWS:     Musculoskeletal: Strength & Muscle Tone: within normal limits Gait & Station: normal Patient leans: N/A  Psychiatric Specialty Exam:  Presentation  General Appearance:  Appropriate for Environment; Casual  Eye Contact: Fair  Speech: -- (a little easier to understand when speaking ot me. mumbles incoherent.)  Speech Volume: Normal  Handedness:No data recorded  Mood and Affect  Mood: -- (Afraid)  Affect: Restricted   Thought Process  Thought Processes: Disorganized  Descriptions of Associations:Intact  Orientation:Full (Time, Place and Person)  Thought Content:Paranoid Ideation  History of Schizophrenia/Schizoaffective disorder:Yes  Duration of Psychotic Symptoms:Greater than six months  Hallucinations:No data recorded Ideas of Reference:None  Suicidal Thoughts:No data recorded Homicidal Thoughts:No data recorded  Sensorium  Memory: Immediate Fair; Recent Fair; Remote Fair  Judgment: Impaired  Insight: Poor   Executive Functions  Concentration: Poor  Attention Span: Poor  Recall: [employer] of Knowledge: Fair  Language: Fair   Psychomotor Activity  Psychomotor Activity:No data recorded  Assets  Assets: Financial Resources/Insurance; Social Support; Intimacy; Housing   Sleep  Sleep:No data recorded   Physical Exam: Physical Exam Vitals and nursing note reviewed.  Constitutional:      Appearance: Normal appearance. She is normal weight.   Neurological:     General: No focal deficit present.     Mental Status: She is alert and oriented to person, place, and time.  Psychiatric:        Attention and Perception: Attention and perception normal.        Mood and Affect: Mood and affect normal.        Speech: Speech normal.        Behavior: Behavior normal. Behavior is cooperative.  Thought Content: Thought content normal.        Cognition and Memory: Cognition and memory normal.        Judgment: Judgment normal.    Review of Systems  Constitutional: Negative.   HENT: Negative.    Eyes: Negative.   Respiratory: Negative.    Cardiovascular: Negative.   Gastrointestinal: Negative.   Genitourinary: Negative.   Musculoskeletal: Negative.   Skin: Negative.   Neurological: Negative.   Endo/Heme/Allergies: Negative.   Psychiatric/Behavioral: Negative.     Blood pressure 125/63, pulse 83, temperature 98.6 F (37 C), temperature source Oral, resp. rate 17, height 5' 10.5" (1.791 m), weight 75 kg, SpO2 92 %. Body mass index is 23.39 kg/m.   Treatment Plan Summary: Daily contact with patient to assess and evaluate symptoms and progress in treatment, Medication management, and Plan change Zyprexa to 10 mg at bedtime only.  Change Ativan to 0.5 mg at bedtime only.  Discontinue as needed's and Ensure.  Parks Ranger, DO 03/28/2022, 11:39 AM

## 2022-03-28 NOTE — Group Note (Signed)
LCSW Group Therapy Note  Group Date: 03/28/2022 Start Time: 1300 End Time: 1400   Type of Therapy and Topic:  Group Therapy - Healthy vs Unhealthy Coping Skills  Participation Level:  Minimal   Description of Group The focus of this group was to determine what unhealthy coping techniques typically are used by group members and what healthy coping techniques would be helpful in coping with various problems. Patients were guided in becoming aware of the differences between healthy and unhealthy coping techniques. Patients were asked to identify 2-3 healthy coping skills they would like to learn to use more effectively.  Therapeutic Goals Patients learned that coping is what human beings do all day long to deal with various situations in their lives Patients defined and discussed healthy vs unhealthy coping techniques Patients identified their preferred coping techniques and identified whether these were healthy or unhealthy Patients determined 2-3 healthy coping skills they would like to become more familiar with and use more often. Patients provided support and ideas to each other   Summary of Patient Progress:   Patient was present for the entirety of group session. Patient participated in opening and closing remarks. However, patient did not contribute at all to the topic of discussion despite encouraged participation.   Therapeutic Modalities Cognitive Behavioral Therapy Motivational Interviewing  Larose Kells 03/28/2022  2:46 PM

## 2022-03-29 NOTE — Plan of Care (Signed)

## 2022-03-29 NOTE — Group Note (Signed)
LCSW Group Therapy Note  Group Date: 03/29/2022 Start Time: 1215 End Time: 1300   Type of Therapy and Topic:  Group Therapy - Healthy vs Unhealthy Coping Skills  Participation Level:  Active   Description of Group The focus of this group was to determine what unhealthy coping techniques typically are used by group members and what healthy coping techniques would be helpful in coping with various problems. Patients were guided in becoming aware of the differences between healthy and unhealthy coping techniques. Patients were asked to identify 2-3 healthy coping skills they would like to learn to use more effectively.  Therapeutic Goals Patients learned that coping is what human beings do all day long to deal with various situations in their lives Patients defined and discussed healthy vs unhealthy coping techniques Patients identified their preferred coping techniques and identified whether these were healthy or unhealthy Patients determined 2-3 healthy coping skills they would like to become more familiar with and use more often. Patients provided support and ideas to each other   Summary of Patient Progress:  During group, she expressed that "I used to walk" as a form of self-care coping skills. She stated that she would prefer to have someone to walk with so that she can start exercising again. Patient proved open to input from peers and feedback from [place]. Patient demonstrated good insight into the subject matter, was respectful of peers, and participated throughout the entire session.   Therapeutic Modalities Cognitive Behavioral Therapy Motivational Interviewing  Hudson Lehmkuhl A [place], [place] 03/29/2022  1:14 PM

## 2022-03-29 NOTE — Plan of Care (Signed)
D- Patient alert and oriented x 4. Patient presents with a pleasant mood and affect. Patient denies SI, HI, AVH, and pain. Patient states that her goal is to be discharged and go home with her family. Patient is expected to discharge tomorrow.   A- Scheduled medications administered to patient, per MD orders. Support and encouragement provided.  Routine safety checks conducted every 15 minutes.  Patient informed to notify staff with problems or concerns.  R- No adverse drug reactions noted. Patient contracts for safety at this time. Patient compliant with medications and treatment plan. Patient receptive, calm, and cooperative. Patient interacts well with others on the unit.  Patient remains safe at this time.   Problem: Health Behavior/Discharge Planning: Goal: Ability to manage health-related needs will improve Outcome: Progressing   Problem: Clinical Measurements: Goal: Ability to maintain clinical measurements within normal limits will improve Outcome: Progressing Goal: Will remain free from infection Outcome: Progressing Goal: Diagnostic test results will improve Outcome: Progressing Goal: Cardiovascular complication will be avoided Outcome: Progressing   Problem: Activity: Goal: Risk for activity intolerance will decrease Outcome: Progressing   Problem: Coping: Goal: Level of anxiety will decrease Outcome: Progressing   Problem: Elimination: Goal: Will not experience complications related to bowel motility Outcome: Progressing   Problem: Skin Integrity: Goal: Risk for impaired skin integrity will decrease Outcome: Progressing

## 2022-03-29 NOTE — [facility] Group Notes (Signed)
[place] Group Notes:  (Nursing/MHT/Case Management/Adjunct)  Date:  03/29/2022  Time:  3:59 PM  Type of Therapy:  Psychoeducational Skills  Participation Level:  Did Not Attend   Adela Lank [facility] 03/29/2022, 3:59 PM

## 2022-03-29 NOTE — Progress Notes (Signed)
[facility] MD Progress Note  03/29/2022 11:34 AM Amy Casey  MRN:  [id number] Subjective: Amy Casey is seen on rounds.  She is doing much better.  She has been compliant with medications and denies any side effects.  We changed her Zyprexa to all at bedtime.  We discussed discharge tomorrow. Principal Problem: Schizophrenia, undifferentiated ([place]) Diagnosis: Principal Problem:   Schizophrenia, undifferentiated ([place])  Total Time spent with patient: 15 minutes  Past Psychiatric History: History of schizophrenia and noncompliance.  Past Medical History:  Past Medical History:  Diagnosis Date   Depression    History of echocardiogram 2011   SEHV   History of renal stone    Hyperlipidemia    Hypertension    Hypokalemia    Nephrolithiasis    Obesity    Schizophrenia ([place])    Wears glasses     Past Surgical History:  Procedure Laterality Date   LUMBAR PUNCTURE  2015   WISDOM TOOTH EXTRACTION     Family History:  Family History  Problem Relation Age of Onset   Diabetes Mother    Heart disease Mother    Chronic Renal Failure Mother    Cancer Maternal Uncle        lung   Kidney disease Mother        dialysis   Stroke Neg Hx    Hypertension Neg Hx    Hyperlipidemia Neg Hx    Family Psychiatric  History: Unremarkable Social History:  Social History   Substance and Sexual Activity  Alcohol Use No     Social History   Substance and Sexual Activity  Drug Use No    Social History   Socioeconomic History   Marital status: Married    Spouse name: Not on file   Number of children: 2   Years of education: 12   Highest education level: Not on file  Occupational History   Not on file  Tobacco Use   Smoking status: Never   Smokeless tobacco: Never  Vaping Use   Vaping Use: Never used  Substance and Sexual Activity   Alcohol use: No   Drug use: No   Sexual activity: Not Currently  Other Topics Concern   Not on file  Social History Narrative      Married, lives at  home with 2 grandchildren, mother in law lives with them, works at [employer].   Exercise - some walking.   12/2020   Social Determinants of Health   Financial Resource Strain: Not on file  Food Insecurity: No Food Insecurity (03/17/2022)   Hunger Vital Sign    Worried About Running Out of Food in the Last Year: Never true    Ran Out of Food in the Last Year: Never true  Transportation Needs: No Transportation Needs (03/17/2022)   PRAPARE - Hydrologist (Medical): No    Lack of Transportation (Non-Medical): No  Physical Activity: Not on file  Stress: Not on file  Social Connections: Not on file   Additional Social History:                         Sleep: Good  Appetite:  Good  Current Medications: Current Facility-Administered Medications  Medication Dose Route Frequency Provider Last Rate Last Admin   acetaminophen (TYLENOL) tablet 650 mg  650 mg Oral Q6H PRN Clapacs, John T, MD       amLODipine (NORVASC) tablet 10 mg  10 mg Oral Daily Clapacs,  Madie Reno, MD   10 mg at 03/29/22 0948   bisacodyl (DULCOLAX) EC tablet 5 mg  5 mg Oral Daily PRN Clapacs, Madie Reno, MD       LORazepam (ATIVAN) tablet 0.5 mg  0.5 mg Oral QHS Parks Ranger, DO       metoprolol tartrate (LOPRESSOR) tablet 50 mg  50 mg Oral BID Clapacs, Madie Reno, MD   50 mg at 03/29/22 3875   multivitamin with minerals tablet 1 tablet  1 tablet Oral Daily Clapacs, Madie Reno, MD   1 tablet at 03/29/22 0949   OLANZapine (ZYPREXA) tablet 10 mg  10 mg Oral QHS Parks Ranger, DO   10 mg at 03/28/22 2126    Lab Results: No results found for this or any previous visit (from the past 48 hour(s)).  Blood Alcohol level:  Lab Results  Component Value Date   ETH <10 01/04/2021   ETH <10 64/33/2951    Metabolic Disorder Labs: Lab Results  Component Value Date   HGBA1C 6.5 (H) 03/18/2022   MPG 140 03/18/2022   MPG 119.76 04/20/2019   No results found for: "PROLACTIN" Lab  Results  Component Value Date   CHOL 172 03/18/2022   TRIG 111 03/18/2022   HDL 30 (L) 03/18/2022   CHOLHDL 5.7 03/18/2022   VLDL 22 03/18/2022   LDLCALC 120 (H) 03/18/2022   LDLCALC 119 (H) 12/31/2020    Physical Findings: AIMS:  , ,  ,  ,    CIWA:    COWS:     Musculoskeletal: Strength & Muscle Tone: within normal limits Gait & Station: normal Patient leans: N/A  Psychiatric Specialty Exam:  Presentation  General Appearance:  Appropriate for Environment; Casual  Eye Contact: Fair  Speech: -- (a little easier to understand when speaking ot me. mumbles incoherent.)  Speech Volume: Normal  Handedness:No data recorded  Mood and Affect  Mood: -- (Afraid)  Affect: Restricted   Thought Process  Thought Processes: Disorganized  Descriptions of Associations:Intact  Orientation:Full (Time, Place and Person)  Thought Content:Paranoid Ideation  History of Schizophrenia/Schizoaffective disorder:Yes  Duration of Psychotic Symptoms:Greater than six months  Hallucinations:No data recorded Ideas of Reference:None  Suicidal Thoughts:No data recorded Homicidal Thoughts:No data recorded  Sensorium  Memory: Immediate Fair; Recent Fair; Remote Fair  Judgment: Impaired  Insight: Poor   Executive Functions  Concentration: Poor  Attention Span: Poor  Recall: [employer] of Knowledge: Fair  Language: Fair   Psychomotor Activity  Psychomotor Activity:No data recorded  Assets  Assets: Financial Resources/Insurance; Social Support; Intimacy; Housing   Sleep  Sleep:No data recorded   Physical Exam: Physical Exam Vitals and nursing note reviewed.  Constitutional:      Appearance: Normal appearance. She is normal weight.  Neurological:     General: No focal deficit present.     Mental Status: She is alert and oriented to person, place, and time.  Psychiatric:        Attention and Perception: Attention and perception normal.         Mood and Affect: Mood and affect normal.        Speech: Speech normal.        Behavior: Behavior normal. Behavior is cooperative.        Thought Content: Thought content normal.        Cognition and Memory: Cognition and memory normal.        Judgment: Judgment normal.    Review of Systems  Constitutional: Negative.  HENT: Negative.    Eyes: Negative.   Respiratory: Negative.    Cardiovascular: Negative.   Gastrointestinal: Negative.   Genitourinary: Negative.   Musculoskeletal: Negative.   Skin: Negative.   Neurological: Negative.   Endo/Heme/Allergies: Negative.   Psychiatric/Behavioral: Negative.     Blood pressure 131/77, pulse 78, temperature 98.1 F (36.7 C), temperature source Oral, resp. rate 18, height 5' 10.5" (1.791 m), weight 75 kg, SpO2 100 %. Body mass index is 23.39 kg/m.   Treatment Plan Summary: Daily contact with patient to assess and evaluate symptoms and progress in treatment, Medication management, and Plan continue current medications.  Plan on discharge tomorrow.  Sarina Ill, DO 03/29/2022, 11:34 AM

## 2022-03-30 MED ORDER — LORAZEPAM 0.5 MG PO TABS: 0.5000 mg | ORAL_TABLET | Freq: Every day | ORAL | 0 refills | Status: DC

## 2022-03-30 MED ORDER — OLANZAPINE 10 MG PO TABS: 10.0000 mg | ORAL_TABLET | Freq: Every day | ORAL | 0 refills | Status: DC

## 2022-03-30 NOTE — Progress Notes (Signed)
Patient is alert and talkative on assessment.  Patient is medication compliant.  Patient denies SI.HI, AVH and depression.  Patient does endorse anxiety but states it is good anxiety because she is scheduled to be discharged tomorrow 1/3.  Patient has been out of room in the dayroom interacting with other patients and at the desk interacting with staff.  Patient denies pain.  Patient can contract for safety.  Q 15 minute rounds in progress, will continue to monitor.

## 2022-03-30 NOTE — Discharge Summary (Signed)
Physician Discharge Summary Note  Patient:  Amy Casey is an 62 y.o., female MRN:  [id number] DOB:  1960-05-14 Patient phone:  [phone] (home)  Patient address:   [address] [place] [address],  Total Time spent with patient: 45 minutes Date of Admission:  03/17/2022 Date of Discharge: 03/30/2022  Reason for Admission:  Amy Casey is a 62 year old African-American female who is involuntarily admitted to geriatric psychiatry for confusion and behavioral disturbance.  She has a history of depression and schizophrenia along with hypertension, UTI, and acute kidney injury.  She went to [facility] where she was catatonic and treated with some Ativan and Rocephin for her UTI.  It sounds like she had stopped her medications prior to admission.  History of Prozac, Zyprexa, trazodone.  She is refusing to eat or take her medications.  She is unable to answer some questions and is easily irritated.  She has a sister and a son involved in her care.  She does not have an outpatient psychiatrist.  She is alert and oriented and able to tell me that it is Christmas but says that she does not want any medications.  I spoke with Dr. Toni Amend about forced meds and he agreed.  She is definitely guarded and paranoid.  She denies suicidal thoughts.   Principal Problem: Schizophrenia, undifferentiated (HCC) Discharge Diagnoses: Principal Problem:   Schizophrenia, undifferentiated (HCC)   Past Psychiatric History: History of schizophrenia and noncompliance with medications.  Past Medical History:  Past Medical History:  Diagnosis Date   Depression    History of echocardiogram 2011   SEHV   History of renal stone    Hyperlipidemia    Hypertension    Hypokalemia    Nephrolithiasis    Obesity    Schizophrenia (HCC)    Wears glasses     Past Surgical History:  Procedure Laterality Date   LUMBAR PUNCTURE  2015   WISDOM TOOTH EXTRACTION     Family History:  Family History   Problem Relation Age of Onset   Diabetes Mother    Heart disease Mother    Chronic Renal Failure Mother    Cancer Maternal Uncle        lung   Kidney disease Mother        dialysis   Stroke Neg Hx    Hypertension Neg Hx    Hyperlipidemia Neg Hx    Family Psychiatric  History: Unremarkable Social History:  Social History   Substance and Sexual Activity  Alcohol Use No     Social History   Substance and Sexual Activity  Drug Use No    Social History   Socioeconomic History   Marital status: Married    Spouse name: Not on file   Number of children: 2   Years of education: 12   Highest education level: Not on file  Occupational History   Not on file  Tobacco Use   Smoking status: Never   Smokeless tobacco: Never  Vaping Use   Vaping Use: Never used  Substance and Sexual Activity   Alcohol use: No   Drug use: No   Sexual activity: Not Currently  Other Topics Concern   Not on file  Social History Narrative      Married, lives at home with 2 grandchildren, mother in law lives with them, works at [employer].   Exercise - some walking.   12/2020   Social Determinants of Health   Financial Resource Strain: Not on file  Food Insecurity: No Food Insecurity (03/17/2022)   Hunger Vital Sign    Worried About Running Out of Food in the Last Year: Never true    Ran Out of Food in the Last Year: Never true  Transportation Needs: No Transportation Needs (03/17/2022)   PRAPARE - Hydrologist (Medical): No    Lack of Transportation (Non-Medical): No  Physical Activity: Not on file  Stress: Not on file  Social Connections: Not on file    Hospital Course: Amy Casey is a 62 year old African-American female who was involuntarily admitted to inpatient psychiatry for worsening psychosis and catatonia.  She recently had COVID and then UTI and AKI and subsequently ended up at [facility] where she was treated for catatonia with Ativan and Zyprexa.   She was transferred to [facility] where her Ativan was continued along with her Zyprexa.  Eventually, she came out of her catatonia and was compliant with medications.  There were no side effects.  Her Zyprexa was changed to 10 mg at bedtime only.  Ativan was decreased and changed to 0.5 mg at bedtime only.  She did really well with medications and her mood and affect completely turned around.  She started interacting well with staff and peers.  It was felt that she maximized hospitalization she was discharged home.  On the day of discharge she denied suicidal ideation, homicidal ideation, auditory or visual hallucinations.  Her judgment and insight were good.  Physical Findings: AIMS:  , ,  ,  ,    CIWA:    COWS:     Musculoskeletal: Strength & Muscle Tone: within normal limits Gait & Station: normal Patient leans: N/A   Psychiatric Specialty Exam:  Presentation  General Appearance:  Appropriate for Environment; Casual  Eye Contact: Fair  Speech: -- (a little easier to understand when speaking ot me. mumbles incoherent.)  Speech Volume: Normal  Handedness:No data recorded  Mood and Affect  Mood: -- (Afraid)  Affect: Restricted   Thought Process  Thought Processes: Disorganized  Descriptions of Associations:Intact  Orientation:Full (Time, Place and Person)  Thought Content:Paranoid Ideation  History of Schizophrenia/Schizoaffective disorder:Yes  Duration of Psychotic Symptoms:Greater than six months  Hallucinations:No data recorded Ideas of Reference:None  Suicidal Thoughts:No data recorded Homicidal Thoughts:No data recorded  Sensorium  Memory: Immediate Fair; Recent Fair; Remote Fair  Judgment: Impaired  Insight: Poor   Executive Functions  Concentration: Poor  Attention Span: Poor  Recall: [employer] of Knowledge: Fair  Language: Fair   Psychomotor Activity  Psychomotor Activity:No data recorded  Assets  Assets: Financial  Resources/Insurance; Social Support; Intimacy; Housing   Sleep  Sleep:No data recorded   Physical Exam: Physical Exam Vitals and nursing note reviewed.  Constitutional:      Appearance: Normal appearance. She is normal weight.  Neurological:     General: No focal deficit present.     Mental Status: She is alert and oriented to person, place, and time.  Psychiatric:        Attention and Perception: Attention and perception normal.        Mood and Affect: Mood and affect normal.        Speech: Speech normal.        Behavior: Behavior normal. Behavior is cooperative.        Thought Content: Thought content normal.        Cognition and Memory: Cognition and memory normal.        Judgment: Judgment normal.  Review of Systems  Constitutional: Negative.   HENT: Negative.    Eyes: Negative.   Respiratory: Negative.    Cardiovascular: Negative.   Gastrointestinal: Negative.   Genitourinary: Negative.   Musculoskeletal: Negative.   Skin: Negative.   Neurological: Negative.   Endo/Heme/Allergies: Negative.   Psychiatric/Behavioral: Negative.     Blood pressure 132/78, pulse 83, temperature 98.4 F (36.9 C), temperature source Oral, resp. rate 14, height 5' 10.5" (1.791 m), weight 75 kg, SpO2 99 %. Body mass index is 23.39 kg/m.   Social History   Tobacco Use  Smoking Status Never  Smokeless Tobacco Never   Tobacco Cessation:  N/A, patient does not currently use tobacco products   Blood Alcohol level:  Lab Results  Component Value Date   ETH <10 01/04/2021   ETH <10 83/15/1761    Metabolic Disorder Labs:  Lab Results  Component Value Date   HGBA1C 6.5 (H) 03/18/2022   MPG 140 03/18/2022   MPG 119.76 04/20/2019   No results found for: "PROLACTIN" Lab Results  Component Value Date   CHOL 172 03/18/2022   TRIG 111 03/18/2022   HDL 30 (L) 03/18/2022   CHOLHDL 5.7 03/18/2022   VLDL 22 03/18/2022   LDLCALC 120 (H) 03/18/2022   LDLCALC 119 (H) 12/31/2020     See Psychiatric Specialty Exam and Suicide Risk Assessment completed by Attending Physician prior to discharge.  Discharge destination:  Home  Is patient on multiple antipsychotic therapies at discharge:  No   Has Patient had three or more failed trials of antipsychotic monotherapy by history:  No  Recommended Plan for Multiple Antipsychotic Therapies: NA   Allergies as of 03/30/2022       Reactions   Paliperidone Swelling, Other (See Comments)   Angioedema, drooling, slurred speech, ptosis   Lisinopril Swelling, Rash, Other (See Comments)   Angioedema, also   Sulfa Antibiotics Itching        Medication List     TAKE these medications      Indication  amLODipine 10 MG tablet Commonly known as: NORVASC TAKE 1 TABLET BY MOUTH ONCE DAILY. APPOINTMENT REQUIRED FOR FUTURE REFILLS. What changed:  how much to take how to take this when to take this additional instructions    LORazepam 0.5 MG tablet Commonly known as: ATIVAN Take 1 tablet (0.5 mg total) by mouth at bedtime.    metoprolol tartrate 50 MG tablet Commonly known as: LOPRESSOR Take 1 tablet (50 mg total) by mouth 2 (two) times daily.    OLANZapine 10 MG tablet Commonly known as: ZYPREXA Take 1 tablet (10 mg total) by mouth at bedtime.  Indication: Schizophrenia   rosuvastatin 10 MG tablet Commonly known as: Crestor Take 1 tablet (10 mg total) by mouth daily.    solifenacin 10 MG tablet Commonly known as: VESICARE Take 1 tablet (10 mg total) by mouth daily.         [facility]up [facility] Follow up on 04/07/2022.   Why: ([place]) You have an initial appointment for followup scheduled for Thursday January 11th at 1pm. Thanks! Please contact their office if you need to reschedule. Contact information: [place] [address] [phone]                 Follow-up recommendations:   [facility]    Signed: Parks Ranger, DO 03/30/2022, 11:14 AM

## 2022-03-30 NOTE — BHH Group Notes (Signed)
[facility] Group Notes:  (Nursing/MHT/Case Management/Adjunct)  Date:  03/30/2022  Time:  11:13 AM  Type of Therapy:   Community Meeting  Participation Level:  Did Not Attend   Gerardo Caiazzo A Yamilex Borgwardt 03/30/2022, 11:13 AM

## 2022-03-30 NOTE — Group Note (Signed)
LCSW Group Therapy Note  Group Date: 03/30/2022 Start Time: 1300 End Time: 1400   Type of Therapy and Topic:  Group Therapy - How To Cope with Nervousness about Discharge   Participation Level:  Active   Description of Group This process group involved identification of patients' feelings about discharge. Some of them are scheduled to be discharged soon, while others are new admissions, but each of them was asked to share thoughts and feelings surrounding discharge from the hospital. One common theme was that they are excited at the prospect of going home, while another was that many of them are apprehensive about sharing why they were hospitalized. Patients were given the opportunity to discuss these feelings with their peers in preparation for discharge.  Therapeutic Goals  Patient will identify their overall feelings about pending discharge. Patient will think about how they might proactively address issues that they believe will once again arise once they get home (i.e. with parents). Patients will participate in discussion about having hope for change.   Summary of Patient Progress:   Patient was present for the entirety of the group session. Patient was an active listener and participated in the topic of discussion, provided helpful advice to others, and added nuance to topic of conversation. Patient shared the importance to maintain personal hygiene, sleep, and good appetite after discharge to prevent readmission. Good health habits were reinforced and connected with positive mental health outcomes.   Therapeutic Modalities Cognitive Behavioral Therapy   Larose Kells 03/30/2022  2:02 PM

## 2022-03-30 NOTE — Progress Notes (Signed)
  [facility] Adult Case Management Discharge Plan :  Will you be returning to the same living situation after discharge:  Yes,  pt will be returning home At discharge, do you have transportation home?: Yes,  pt's spouse will be providing transportation Do you have the ability to pay for your medications: Yes,  pt has Amy Casey of information consent forms completed and in the chart;  Patient's signature needed at discharge.  Patient to Follow up at:  [facility] Follow up on 04/07/2022.   Why: ([place]) You have an initial appointment for followup scheduled for Thursday January 11th at 1pm. Thanks! Please contact their office if you need to reschedule. Contact information: Amy Casey [address] [phone]                 Next level of care provider has access to Amy Casey and Suicide Prevention discussed: Yes,  SPE completed with pt's spouse     Has patient been referred to the Quitline?: N/Amy patient is not Amy smoker  Patient has been referred for addiction treatment: N/Amy  Amy Casey Amy Casey, [place] 03/30/2022, 10:39 AM

## 2022-03-30 NOTE — Progress Notes (Signed)

## 2022-03-30 NOTE — BHH Suicide Risk Assessment (Signed)
[facility] Discharge Suicide Risk Assessment   Principal Problem: Schizophrenia, undifferentiated ([place]) Discharge Diagnoses: Principal Problem:   Schizophrenia, undifferentiated ([place])   Total Time spent with patient: 1 hour  Musculoskeletal: Strength & Muscle Tone: within normal limits Gait & Station: normal Patient leans: N/A  Psychiatric Specialty Exam  Presentation  General Appearance:  Appropriate for Environment; Casual  Eye Contact: Fair  Speech: -- (a little easier to understand when speaking ot me. mumbles incoherent.)  Speech Volume: Normal  Handedness:No data recorded  Mood and Affect  Mood: -- (Afraid)  Duration of Depression Symptoms: No data recorded Affect: Restricted   Thought Process  Thought Processes: Disorganized  Descriptions of Associations:Intact  Orientation:Full (Time, Place and Person)  Thought Content:Paranoid Ideation  History of Schizophrenia/Schizoaffective disorder:Yes  Duration of Psychotic Symptoms:Greater than six months  Hallucinations:No data recorded Ideas of Reference:None  Suicidal Thoughts:No data recorded Homicidal Thoughts:No data recorded  Sensorium  Memory: Immediate Fair; Recent Fair; Remote Fair  Judgment: Impaired  Insight: Poor   Executive Functions  Concentration: Poor  Attention Span: Poor  Recall: [employer] of Knowledge: Fair  Language: Fair   Psychomotor Activity  Psychomotor Activity:No data recorded  Assets  Assets: Financial Resources/Insurance; Social Support; Intimacy; Housing   Sleep  Sleep:No data recorded  Physical Exam: Physical Exam ROS Blood pressure 132/78, pulse 83, temperature 98.4 F (36.9 C), temperature source Oral, resp. rate 14, height 5' 10.5" (1.791 m), weight 75 kg, SpO2 99 %. Body mass index is 23.39 kg/m.  Mental Status Per Nursing Assessment::   On Admission:  NA  Demographic Factors:  NA  Loss Factors: NA  Historical  Factors: NA  Risk Reduction Factors:   Living with another person, especially a relative, Positive social support, Positive therapeutic relationship, and Positive coping skills or problem solving skills  Continued Clinical Symptoms:  Schizophrenia:   Paranoid or undifferentiated type  Cognitive Features That Contribute To Risk:  None    Suicide Risk:  Minimal: No identifiable suicidal ideation.  Patients presenting with no risk factors but with morbid ruminations; may be classified as minimal risk based on the severity of the depressive symptoms   [facility] Follow up on 04/07/2022.   Why: ([place]) You have an initial appointment for followup scheduled for Thursday January 11th at 1pm. Thanks! Please contact their office if you need to reschedule. Contact information: [place] [address] [phone]                 Plan Of Care/Follow-up recommendations: [place], DO 03/30/2022, 11:06 AM

## 2022-03-30 NOTE — Plan of Care (Signed)
D- Patient alert and oriented. Patient presents with a pleasant and appropriate mood and affect. Patient denies SI, HI, AVH, and pain. Patient states she is experiencing anxiety and did not sleep much as she is excited about being discharged.   A- Scheduled medications administered to patient, per MD orders. Support and encouragement provided.  Routine safety checks conducted every 15 minutes.  Patient informed to notify staff with problems or concerns.  R- No adverse drug reactions noted. Patient contracts for safety at this time. Patient compliant with medications and treatment plan. Patient receptive, calm, and cooperative. Patient interacts well with others on the unit.  Patient remains safe at this time.  Problem: Education: Goal: Knowledge of General Education information will improve Description: Including pain rating scale, medication(s)/side effects and non-pharmacologic comfort measures Outcome: Progressing   Problem: Health Behavior/Discharge Planning: Goal: Ability to manage health-related needs will improve Outcome: Progressing   Problem: Clinical Measurements: Goal: Ability to maintain clinical measurements within normal limits will improve Outcome: Progressing Goal: Will remain free from infection Outcome: Progressing Goal: Diagnostic test results will improve Outcome: Progressing Goal: Respiratory complications will improve Outcome: Progressing Goal: Cardiovascular complication will be avoided Outcome: Progressing   Problem: Activity: Goal: Risk for activity intolerance will decrease Outcome: Progressing   Problem: Nutrition: Goal: Adequate nutrition will be maintained Outcome: Progressing   Problem: Coping: Goal: Level of anxiety will decrease Outcome: Progressing   Problem: Elimination: Goal: Will not experience complications related to bowel motility Outcome: Progressing Goal: Will not experience complications related to urinary retention Outcome:  Progressing   Problem: Pain Managment: Goal: General experience of comfort will improve Outcome: Progressing   Problem: Safety: Goal: Ability to remain free from injury will improve Outcome: Progressing   Problem: Skin Integrity: Goal: Risk for impaired skin integrity will decrease Outcome: Progressing

## 2022-04-01 ENCOUNTER — Telehealth: Payer: Self-pay | Admitting: Medical

## 2022-04-01 ENCOUNTER — Other Ambulatory Visit: Payer: Self-pay | Admitting: Medical

## 2022-04-01 MED ORDER — METOPROLOL TARTRATE 50 MG PO TABS: 50.0000 mg | ORAL_TABLET | Freq: Two times a day (BID) | ORAL | 1 refills | Status: DC

## 2022-04-01 NOTE — Telephone Encounter (Signed)
Pt called and states that she was placed on metoprolol tartrate 50 mg while in the hospital. She states that at discharge that told her she needed to call her PCP to get medication. She is requesting medication be sent to [facility]. Pt can be reached at [phone].

## 2022-04-04 NOTE — Telephone Encounter (Signed)
Left message for pt to call me back 

## 2022-04-06 NOTE — Telephone Encounter (Signed)
Left message for pt that med was sent in.   Pt saw psych end of December

## 2022-05-10 ENCOUNTER — Other Ambulatory Visit: Payer: Self-pay | Admitting: Medical

## 2022-05-10 NOTE — Telephone Encounter (Signed)
Looks like this was discontinued on 02/2022

## 2022-06-08 ENCOUNTER — Telehealth: Payer: Self-pay | Admitting: Medical

## 2022-06-08 NOTE — Telephone Encounter (Signed)
DDS records request forwarded to [facility]

## 2022-12-26 ENCOUNTER — Ambulatory Visit: Payer: No Typology Code available for payment source | Admitting: Medical

## 2022-12-26 ENCOUNTER — Ambulatory Visit
Admission: RE | Admit: 2022-12-26 | Discharge: 2022-12-26 | Disposition: A | Discharge status: Home / Self Care | Payer: No Typology Code available for payment source | Source: Ambulatory Visit | Attending: Medical | Admitting: Medical

## 2022-12-26 VITALS — BP 110/62 | HR 57 | Wt 207.8 lb

## 2022-12-26 DIAGNOSIS — M25552 Pain in left hip: Secondary | ICD-10-CM

## 2022-12-26 DIAGNOSIS — Z23 Encounter for immunization: Secondary | ICD-10-CM | POA: Diagnosis not present

## 2022-12-26 DIAGNOSIS — M79605 Pain in left leg: Secondary | ICD-10-CM

## 2022-12-26 DIAGNOSIS — I10 Essential (primary) hypertension: Secondary | ICD-10-CM

## 2022-12-26 DIAGNOSIS — N3281 Overactive bladder: Secondary | ICD-10-CM

## 2022-12-26 DIAGNOSIS — E1169 Type 2 diabetes mellitus with other specified complication: Secondary | ICD-10-CM

## 2022-12-26 DIAGNOSIS — F200 Paranoid schizophrenia: Secondary | ICD-10-CM

## 2022-12-26 DIAGNOSIS — E785 Hyperlipidemia, unspecified: Secondary | ICD-10-CM

## 2022-12-26 DIAGNOSIS — E119 Type 2 diabetes mellitus without complications: Secondary | ICD-10-CM | POA: Insufficient documentation

## 2022-12-26 DIAGNOSIS — Q845 Enlarged and hypertrophic nails: Secondary | ICD-10-CM

## 2022-12-26 MED ORDER — ROSUVASTATIN CALCIUM 10 MG PO TABS: 10.0000 mg | ORAL_TABLET | Freq: Every day | ORAL | 2 refills | Status: DC

## 2022-12-26 MED ORDER — SOLIFENACIN SUCCINATE 10 MG PO TABS: 10.0000 mg | ORAL_TABLET | Freq: Every day | ORAL | 2 refills | Status: DC

## 2022-12-26 MED ORDER — AMLODIPINE BESYLATE 10 MG PO TABS: 10.0000 mg | ORAL_TABLET | Freq: Every morning | ORAL | 2 refills | Status: DC

## 2022-12-26 NOTE — Progress Notes (Signed)
Subjective:  Amy Casey is a 62 y.o. female who presents for Chief Complaint  Patient presents with   Medical Management of Chronic Issues    Med check, having leg pain but would possibily go on cymbalta for pain Eye doctor- Prudencio Burly in [place] [employer]     Medical team: Zeno Hickel, Kermit Balo, PA-C here for primary care Jacelyn Pi, NP with [facility] Dr .Aldean Baker, ortho Dr. Waverly Ferrari, vascular   Concerns: Here for med check  History of schizophrenia-currently seeing psychiatry and counseling thankfully.  She is compliant with medications fluoxetine and Latuda.  She was on a different set of medicines but after a hospital visit in December she was not doing well on those medications and was switched to the current therapy.  She has been going regularly to her psychiatry office.  She is working 3 days a week currently.  Hypertension - compliant with Amlodipine 10mg  daily.   Hyperlipidemia-Ran out of Crestor, but was taking this.  She is nonfasting today.  She ate a hamburger this morning  Overactive bladder-almost out of vesicare for OAB.  This helps a lot.  Her main concern today is left leg and hip pain.  It aggravates her regularly.  She uses a cane sometimes.  No swelling, no fall injury or trauma.  She heard that Cymbalta helps her depression and pain and wanted to see about trying this  No other aggravating or relieving factors.    No other c/o.  Past Medical History:  Diagnosis Date   Depression    History of echocardiogram 2011   [facility]   History of renal stone    Hyperlipidemia    Hypertension    Hypokalemia    Nephrolithiasis    Obesity    Schizophrenia (HCC)    Wears glasses    Current Outpatient Medications on File Prior to Visit  Medication Sig Dispense Refill   FLUoxetine (PROZAC) 20 MG capsule Take 20 mg by mouth daily.     lurasidone (LATUDA) 40 MG TABS tablet Take 40 mg by mouth daily.     No  current facility-administered medications on file prior to visit.   Past Surgical History:  Procedure Laterality Date   LUMBAR PUNCTURE  2015   WISDOM TOOTH EXTRACTION      The following portions of the patient's history were reviewed and updated as appropriate: allergies, current medications, past family history, past medical history, past social history, past surgical history and problem list.  ROS Otherwise as in subjective above    Objective: BP 110/62   Pulse (!) 57   Wt 207 lb 12.8 oz (94.3 kg)   BMI 29.39 kg/m   General appearance: alert, no distress, well developed, well nourished Neck: supple, no lymphadenopathy, no thyromegaly, no masses Heart: RRR, normal S1, S2, no murmurs Lungs: CTA bilaterally, no wheezes, rhonchi, or rales Abdomen: +bs, soft, non tender, non distended, no masses, no hepatomegaly, no splenomegaly Pulses: 2+ radial pulses, 2+ pedal pulses, normal cap refill Ext: no edema MSK: left hip tender and ROM somewhat limited and pain noted with internal and external ROM.  Mild tenderness along the left lateral thigh.  Otherwise leg nontender.  No obvious swelling Back: With decreased range of motion with pain noted with range of motion Able to do heel and toe walk.  Negative straight leg raise.  Normal legs strength and sensation   Diabetic Foot Exam - Simple   Simple Foot Form Diabetic Foot exam was performed with  the following findings: Yes 12/26/2022 10:46 AM  Visual Inspection See comments: Yes Sensation Testing Intact to touch and monofilament testing bilaterally: Yes Pulse Check Posterior Tibialis and Dorsalis pulse intact bilaterally: Yes Comments Hypertophic nails thorughout, brownish discoloration along medial feet, but no other specific skin lesion        Assessment: Encounter Diagnoses  Name Primary?   Type 2 diabetes mellitus with other specified complication, without long-term current use of insulin (HCC) Yes   Overactive bladder     COVID-19 vaccine administered    Needs flu shot    Paranoid schizophrenia (HCC)    Essential hypertension    Hyperlipidemia, unspecified hyperlipidemia type    Enlarged and hypertrophic nails    Left hip pain    Left leg pain      Plan: Diabetes Not currently on medication.  Last labs in December 2023 suggested that you can remain off medicine at that time We will recheck labs today to determine if you need to be on medication to help control your blood sugar Consider glucose monitoring at home.  Blood sugar should be less than 130 fasting for goal.  I can prescribe a glucometer if you would like to start checking your sugars You should see an eye doctor yearly for diabetic eye exam and make sure they send [place] a copy of the notes  Overactive bladder-continue current medication Vesicare   Hypertension-continue amlodipine   High cholesterol-restart Crestor and try not to run out of this. Last labs not quite to goal.  She is nonfasting today unfortunately   Hypertrophic toenails-referral to podiatry for diabetic nail care   Leg pain, hip pain-  Please go to [facility] for your left hip xray.   Their hours are 8am - 4:30 pm Monday - Friday.  Take your insurance card with you.  [facility] [phone]   [address] [place], [place] [address]     Counseled on the influenza virus vaccine.  Vaccine information sheet given.  Influenza vaccine given after consent obtained.  Counseled on the Covid virus vaccine.  Vaccine information sheet given.  Covid vaccine given after consent obtained.   Mellonie was seen today for medical management of chronic issues.  Diagnoses and all orders for this visit:  Type 2 diabetes mellitus with other specified complication, without long-term current use of insulin (HCC) -     Comprehensive metabolic panel -     Hemoglobin A1c -     Microalbumin/Creatinine Ratio, Urine -     Ambulatory referral to  Podiatry  Overactive bladder  COVID-19 vaccine administered -     Pfizer Comirnaty Covid -19 Vaccine 57yrs and older  Needs flu shot -     Flu vaccine trivalent PF, 6mos and older(Flulaval,Afluria,Fluarix,Fluzone)  Paranoid schizophrenia (HCC)  Essential hypertension  Hyperlipidemia, unspecified hyperlipidemia type  Enlarged and hypertrophic nails -     Ambulatory referral to Podiatry  Left hip pain -     DG HIP UNILAT WITH PELVIS 2-3 VIEWS LEFT; Future  Left leg pain -     DG HIP UNILAT WITH PELVIS 2-3 VIEWS LEFT; Future  Other orders -     solifenacin (VESICARE) 10 MG tablet; Take 1 tablet (10 mg total) by mouth daily. -     rosuvastatin (CRESTOR) 10 MG tablet; Take 1 tablet (10 mg total) by mouth daily. -     amLODipine (NORVASC) 10 MG tablet; Take 1 tablet (10 mg total) by mouth in the morning.  Follow up: pending labs

## 2022-12-26 NOTE — Patient Instructions (Signed)
Diabetes Not currently on medication.  Last labs in December 2023 suggested that you can remain off medicine at that time We will recheck labs today to determine if you need to be on medication to help control your blood sugar Consider glucose monitoring at home.  Blood sugar should be less than 130 fasting for goal.  I can prescribe a glucometer if you would like to start checking your sugars You should see an eye doctor yearly for diabetic eye exam and make sure they send [place] a copy of the notes  Overactive bladder-continue current medication Vesicare   Hypertension-continue amlodipine   High cholesterol-restart Crestor and try not to run out of this. Last labs not quite to goal.  She is nonfasting today unfortunately   Hypertrophic toenails-referral to podiatry for diabetic nail care   Leg pain, hip pain-  Please go to [facility] for your left hip xray.   Their hours are 8am - 4:30 pm Monday - Friday.  Take your insurance card with you.  [facility] [phone]   [address] [place], [place] [address]     Counseled on the influenza virus vaccine.  Vaccine information sheet given.  Influenza vaccine given after consent obtained.  Counseled on the Covid virus vaccine.  Vaccine information sheet given.  Covid vaccine given after consent obtained.

## 2022-12-27 ENCOUNTER — Other Ambulatory Visit: Payer: Self-pay | Admitting: Medical

## 2022-12-27 LAB — MICROALBUMIN / CREATININE URINE RATIO
Creatinine, Urine: 280.6 mg/dL
Microalb/Creat Ratio: 15 mg/g{creat} (ref 0–29)
Microalbumin, Urine: 42.7 ug/mL

## 2022-12-27 LAB — COMPREHENSIVE METABOLIC PANEL
ALT: 10 [IU]/L (ref 0–32)
AST: 15 [IU]/L (ref 0–40)
Albumin: 4.4 g/dL (ref 3.9–4.9)
Alkaline Phosphatase: 74 [IU]/L (ref 44–121)
BUN/Creatinine Ratio: 21 (ref 12–28)
BUN: 23 mg/dL (ref 8–27)
Bilirubin Total: 0.3 mg/dL (ref 0.0–1.2)
CO2: 24 mmol/L (ref 20–29)
Calcium: 9.6 mg/dL (ref 8.7–10.3)
Chloride: 104 mmol/L (ref 96–106)
Creatinine, Ser: 1.07 mg/dL — ABNORMAL HIGH (ref 0.57–1.00)
Globulin, Total: 2.8 g/dL (ref 1.5–4.5)
Glucose: 81 mg/dL (ref 70–99)
Potassium: 3.9 mmol/L (ref 3.5–5.2)
Sodium: 142 mmol/L (ref 134–144)
Total Protein: 7.2 g/dL (ref 6.0–8.5)
eGFR: 58 mL/min/{1.73_m2} — ABNORMAL LOW (ref >59)

## 2022-12-27 LAB — HEMOGLOBIN A1C
Est. average glucose Bld gHb Est-mCnc: 128 mg/dL
Hgb A1c MFr Bld: 6.1 % — ABNORMAL HIGH (ref 4.8–5.6)

## 2022-12-27 MED ORDER — ACETAMINOPHEN 500 MG PO TABS: 500.0000 mg | ORAL_TABLET | Freq: Three times a day (TID) | ORAL | 1 refills | Status: DC | PRN

## 2022-12-27 MED ORDER — GABAPENTIN 100 MG PO CAPS: 100.0000 mg | ORAL_CAPSULE | Freq: Three times a day (TID) | ORAL | 1 refills | Status: DC

## 2022-12-27 NOTE — Progress Notes (Signed)
Kidney marker stable, electrolytes and blood sugar looks okay, liver okay, hemoglobin A1c stable at 6.1%.  Still pending microalbumin kidney marker.  Medications have been sent to the pharmacy.  Lets start a trial of gabapentin 100 mg at bedtime and Tylenol as needed up to twice a day for pain.  Gabapentin is a neuropathic pain medicine.  This can cause drowsiness.  We will start at the lowest dose at night.  Make sure you inform your psychiatrist about your medication changes  Go for x-ray.

## 2022-12-27 NOTE — Progress Notes (Signed)
Microalbumin kidney marker normal

## 2023-01-16 NOTE — Progress Notes (Signed)
Your x-ray shows some degenerative hip changes on both sides.  If still having a lot of pain I recommend referral to orthopedics

## 2023-01-23 ENCOUNTER — Other Ambulatory Visit: Payer: Self-pay | Admitting: Internal Medicine

## 2023-01-23 DIAGNOSIS — M25551 Pain in right hip: Secondary | ICD-10-CM

## 2023-02-02 ENCOUNTER — Ambulatory Visit: Payer: Managed Care, Other (non HMO) | Admitting: Physician Assistant

## 2023-02-02 ENCOUNTER — Ambulatory Visit (INDEPENDENT_AMBULATORY_CARE_PROVIDER_SITE_OTHER): Payer: Managed Care, Other (non HMO) | Admitting: Podiatry

## 2023-02-02 ENCOUNTER — Encounter: Payer: Self-pay | Admitting: Physician Assistant

## 2023-02-02 ENCOUNTER — Encounter: Payer: Self-pay | Admitting: Podiatry

## 2023-02-02 ENCOUNTER — Ambulatory Visit (INDEPENDENT_AMBULATORY_CARE_PROVIDER_SITE_OTHER): Payer: Managed Care, Other (non HMO)

## 2023-02-02 DIAGNOSIS — B353 Tinea pedis: Secondary | ICD-10-CM

## 2023-02-02 DIAGNOSIS — E1169 Type 2 diabetes mellitus with other specified complication: Secondary | ICD-10-CM

## 2023-02-02 DIAGNOSIS — M2141 Flat foot [pes planus] (acquired), right foot: Secondary | ICD-10-CM | POA: Diagnosis not present

## 2023-02-02 DIAGNOSIS — M79672 Pain in left foot: Secondary | ICD-10-CM

## 2023-02-02 DIAGNOSIS — B351 Tinea unguium: Secondary | ICD-10-CM

## 2023-02-02 DIAGNOSIS — M160 Bilateral primary osteoarthritis of hip: Secondary | ICD-10-CM | POA: Insufficient documentation

## 2023-02-02 DIAGNOSIS — M79671 Pain in right foot: Secondary | ICD-10-CM | POA: Diagnosis not present

## 2023-02-02 DIAGNOSIS — M25551 Pain in right hip: Secondary | ICD-10-CM

## 2023-02-02 DIAGNOSIS — E119 Type 2 diabetes mellitus without complications: Secondary | ICD-10-CM

## 2023-02-02 DIAGNOSIS — M79674 Pain in right toe(s): Secondary | ICD-10-CM | POA: Diagnosis not present

## 2023-02-02 DIAGNOSIS — M2142 Flat foot [pes planus] (acquired), left foot: Secondary | ICD-10-CM

## 2023-02-02 DIAGNOSIS — M79675 Pain in left toe(s): Secondary | ICD-10-CM | POA: Diagnosis not present

## 2023-02-02 DIAGNOSIS — E669 Obesity, unspecified: Secondary | ICD-10-CM

## 2023-02-02 MED ORDER — TERBINAFINE HCL 250 MG PO TABS: 250.0000 mg | ORAL_TABLET | Freq: Every day | ORAL | 0 refills | Status: AC

## 2023-02-02 MED ORDER — MELOXICAM 7.5 MG PO TABS: 7.5000 mg | ORAL_TABLET | Freq: Every day | ORAL | 0 refills | Status: DC

## 2023-02-02 MED ORDER — MELOXICAM 15 MG PO TABS: 15.0000 mg | ORAL_TABLET | Freq: Every day | ORAL | 3 refills | Status: DC

## 2023-02-02 MED ORDER — CLOTRIMAZOLE-BETAMETHASONE 1-0.05 % EX CREA: 1.0000 | TOPICAL_CREAM | Freq: Every day | CUTANEOUS | 0 refills | Status: DC

## 2023-02-02 NOTE — Progress Notes (Signed)
Office Visit Note   Patient: Amy Casey           Date of Birth: 24-Jul-1960           MRN: [id number] Visit Date: 02/02/2023              Requested by: Jac Canavan, PA-C [address] [place],  [place] [address] PCP: Jac Canavan, PA-C   Assessment & Plan: Visit Diagnoses:  1. Pain in right hip   2. Bilateral primary osteoarthritis of hip     Plan: Pleasant 62 year old woman comes in today with a chief complaint of left greater than right hip pain.  Denies any injuries.  She did run track a little bit as a teenager.  Does have a family history of arthritis of her hips her mother having had hip replacements.  She is a diabetic with a well-controlled A1c of under 6.5.  She has tried ibuprofen without much success.  She is not a smoker.  X-rays today demonstrate significant degenerative changes of osteoarthritis of both hips.  We talked about the natural history of this.  I will try her on some low-dose meloxicam.  I told her not to take ibuprofen while taking this.  She can take Tylenol in between.  She may also benefit from an intra-articular injection in the left hip first.  I will refer her to Dr. Shon Baton.  I told her if she had good success with the left hip he would probably be willing to do her right hip 2 weeks later.  May follow-up with me as needed ultimately if she cannot get good relief I would refer her for discussion of hip replacement surgery  Follow-Up Instructions: No follow-ups on file.   Orders:  Orders Placed This Encounter  Procedures   XR HIP UNILAT W OR W/O PELVIS 2-3 VIEWS RIGHT   Meds ordered this encounter  Medications   meloxicam (MOBIC) 7.5 MG tablet    Sig: Take 1 tablet (7.5 mg total) by mouth daily.    Dispense:  30 tablet    Refill:  0      Procedures: No procedures performed   Clinical Data: No additional findings.   Subjective: Chief Complaint  Patient presents with   Right Hip - Pain   Left Hip - Pain     HPI Pleasant 62 year old woman with a 2 to 24-month history of increasing pain left greater than right hip.  Denies any fever or chills.  She says she has been told she had arthritis in her knee also but that seemed to get better.  She admits that she has had some ongoing problems with her hips but not as much as recently.  Despite is any paresthesias any weakness any back pain Review of Systems  All other systems reviewed and are negative.    Objective: Vital Signs: There were no vitals taken for this visit.  Physical Exam Constitutional:      Appearance: Normal appearance.  Pulmonary:     Effort: Pulmonary effort is normal.  Skin:    General: Skin is warm and dry.  Neurological:     Mental Status: She is alert.  Psychiatric:        Mood and Affect: Mood normal.        Behavior: Behavior normal.     Ortho Exam Examination of her hips pain is focused in the groin no low back pain pain does radiate down to her knee.  She is neurovascularly intact  has good strength no radicular findings compartments are soft and nontender.  She does have stiffness especially in the left hip with motion.  Right hip not as bad Specialty Comments:  No specialty comments available.  Imaging: XR HIP UNILAT W OR W/O PELVIS 2-3 VIEWS RIGHT  Result Date: 02/02/2023 Radiographs of her right hip pelvis demonstrate no acute fracture she does have advanced degenerative changes of the right hip joint with sclerotic changes joint space narrowing periarticular osteophytes    PMFS History: Patient Active Problem List   Diagnosis Date Noted   Bilateral primary osteoarthritis of hip 02/02/2023   Enlarged and hypertrophic nails 12/26/2022   Left hip pain 12/26/2022   Diabetes mellitus (HCC) 12/26/2022   Type 2 diabetes mellitus with obesity (HCC) 03/12/2022   Impaired fasting blood sugar 09/07/2021   Need for influenza vaccination 02/23/2021   Screening for diabetes mellitus 02/23/2021   Edema  02/23/2021   Weight gain 02/23/2021   Elevated uric acid in blood 02/23/2021   Need for pneumococcal vaccination 02/23/2021   Screen for colon cancer 02/23/2021   Paresthesia 02/23/2021   Cerebrovascular disease 12/31/2020   Vaccine refused by patient 12/31/2020   Hyperlipidemia 12/31/2020   Overactive bladder 07/22/2019   Vaccine counseling 07/22/2019   H/O positive serological reaction for syphilis 05/24/2019   Essential hypertension 11/06/2018   Encounter for health maintenance examination in adult 08/21/2018   Catatonia 08/16/2018   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   High risk medication use 07/25/2016   Involuntary commitment    Paranoid schizophrenia (HCC) 06/16/2015   Noncompliance 06/16/2015   Past Medical History:  Diagnosis Date   Depression    History of echocardiogram 2011   [facility]   History of renal stone    Hyperlipidemia    Hypertension    Hypokalemia    Nephrolithiasis    Obesity    Schizophrenia (HCC)    Wears glasses     Family History  Problem Relation Age of Onset   Diabetes Mother    Heart disease Mother    Chronic Renal Failure Mother    Cancer Maternal Uncle        lung   Kidney disease Mother        dialysis   Stroke Neg Hx    Hypertension Neg Hx    Hyperlipidemia Neg Hx     Past Surgical History:  Procedure Laterality Date   LUMBAR PUNCTURE  2015   WISDOM TOOTH EXTRACTION     Social History   Occupational History   Not on file  Tobacco Use   Smoking status: Never   Smokeless tobacco: Never  Vaping Use   Vaping status: Never Used  Substance and Sexual Activity   Alcohol use: No   Drug use: No   Sexual activity: Not Currently

## 2023-02-02 NOTE — Patient Instructions (Signed)
VISIT SUMMARY:  Today, we discussed your foot pain, thick toenails, and itching in your feet. We also reviewed your arthritis in the knee and hips, and your diabetes management.  YOUR PLAN:  -FOOT PAIN: Your foot pain is likely due to prolonged standing and possibly unsuitable footwear. We recommend trying [facility] SR shoes for better cushioning and arch support. Additionally, we have prescribed Meloxicam to help relieve the pain.  -ONYCHOMYCOSIS: Onychomycosis is a fungal infection of the toenails, which can cause them to become thick and uncomfortable. We have prescribed oral Terbinafine for 3 months to treat this infection. If this treatment is not effective, we may consider permanent toenail removal.  -TINEA PEDIS/ECZEMA: Tinea Pedis, also known as athlete's foot, or eczema can cause a dry, itchy, and scaling rash on your feet. We have prescribed a topical antifungal/steroid cream to help alleviate these symptoms.  -ARTHRITIS: Arthritis in your knee and hips can cause joint pain and stiffness. We have prescribed Meloxicam to help manage the pain in your hips.  -DIABETES MANAGEMENT: Your diabetes appears to be well-controlled based on your recent A1c levels. Continue with your current management plan and monitor your blood sugar levels regularly.  INSTRUCTIONS:  Please schedule a follow-up visit with Dr. Donzetta Matters or Dr. Stacie Acres to ensure proper diabetic foot care and toenail trimming. Continue taking Meloxicam as prescribed and start using the topical antifungal/steroid cream for your feet. If you experience any side effects or if your symptoms do not improve, contact our office.

## 2023-02-03 NOTE — Progress Notes (Signed)
Subjective:  Patient ID: Amy Casey, female    DOB: [birth year],  MRN: [id number]  Chief Complaint  Patient presents with   Diabetes    PATIENT CAME TO GET HER TOE NAILS CUT , SHE IS HAVE SOME PAIN IN HER HEELS BILATERAL . HER TOE NAILS ARE LONG AND HURTS HER SHE STATES     Discussed the use of AI scribe software for clinical note transcription with the patient, who gave verbal consent to proceed.  History of Present Illness   The patient, with a history of diabetes, presents with foot pain and thick toenails. 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 She also reports itching in her feet, which the doctor suspects may be due to athlete's foot or a rash. The patient denies any numbness or tingling in her feet.  In addition to her foot issues, the patient also reports having arthritis in her knee and both hips, which is currently untreated. She has been prescribed meloxicam for the hip pain, but it is unclear if she has started taking it yet.  The patient's diabetes is reportedly under control, with a recent A1c check indicating that her levels are fine. However, she has thick, fungal toenails, which she tries to manage by cutting them herself.          Objective:    Physical Exam   CARDIOVASCULAR: Bilateral feet with palpable pulses, good capillary fill time. SKIN: Moccasin distribution, hyperpigmented and dry scaling, itching and rash. Thick and elongated fungal mycotic nails times ten with severe dystrophy. No pain with palpation of the plantar heel.       No images are attached to the encounter.          Assessment:   1. Pain due to onychomycosis of toenails of both feet   2. Onychomycosis   3. Pes planus of both feet   4. Heel pain, bilateral   5. Tinea pedis of both feet   6. Type 2 diabetes mellitus with obesity (HCC)   7. Encounter for diabetic foot exam  [facility])      Plan:  Patient was evaluated and treated and all questions answered.  Assessment and Plan    Foot Pain   Chronic foot pain in this individual likely stems from prolonged standing and may be worsened by unsuitable footwear, though there are no indications of neuropathy or peripheral vascular disease. We will recommend [employer] SR shoes for enhanced cushioning and arch support and prescribe Meloxicam for pain relief.  Onychomycosis   She is experiencing severe discomfort due to a fungal infection in the toenails. Oral Terbinafine will be prescribed for 3 months, with the consideration of permanent toenail removal if this treatment does not yield success.  Tinea Pedis/Eczema   She presents with a hyperpigmented, dry, scaling, and itchy rash on her feet, indicative of Tinea Pedis or Eczema. A topical antifungal/steroid cream will be prescribed to address these symptoms.  Follow-up   To ensure proper diabetic foot care and toenail debridement, a follow-up visit with Dr. Eloy End or Dr. Stacie Acres is scheduled.      Patient educated on diabetes. Discussed proper diabetic foot care and discussed risks and complications of disease. Educated patient in depth on reasons to return to the office immediately should he/she discover anything concerning or new on the feet. All questions answered. Discussed proper shoes as well.  Return in about 3 months (around 05/05/2023) for at risk diabetic foot care.

## 2023-02-09 ENCOUNTER — Other Ambulatory Visit: Payer: Self-pay

## 2023-02-09 ENCOUNTER — Ambulatory Visit: Payer: Managed Care, Other (non HMO) | Admitting: Sports Medicine

## 2023-02-09 ENCOUNTER — Encounter: Payer: Self-pay | Admitting: Sports Medicine

## 2023-02-09 DIAGNOSIS — M160 Bilateral primary osteoarthritis of hip: Secondary | ICD-10-CM | POA: Diagnosis not present

## 2023-02-09 DIAGNOSIS — E669 Obesity, unspecified: Secondary | ICD-10-CM | POA: Diagnosis not present

## 2023-02-09 DIAGNOSIS — E1169 Type 2 diabetes mellitus with other specified complication: Secondary | ICD-10-CM

## 2023-02-09 DIAGNOSIS — M25552 Pain in left hip: Secondary | ICD-10-CM | POA: Diagnosis not present

## 2023-02-09 MED ORDER — LIDOCAINE HCL 1 % IJ SOLN
4.0000 mL | INTRAMUSCULAR | Status: AC | PRN
  Administered 2023-02-09: 4 mL

## 2023-02-09 MED ORDER — METHYLPREDNISOLONE ACETATE 40 MG/ML IJ SUSP
40.0000 mg | INTRAMUSCULAR | Status: AC | PRN
  Administered 2023-02-09: 40 mg via INTRA_ARTICULAR

## 2023-02-09 NOTE — Progress Notes (Signed)
Office & Procedure Note  Patient: Amy Casey             Date of Birth: 06/01/1960           MRN: [id number]             Visit Date: 02/09/2023  HPI: Yaribeth is a very pleasant 62 year old female who presents with bilateral hip pain, left greater than right.  She has been diagnosed with osteoarthritis of the hips.  Is interested in trying ultrasound-guided injection, has not had this performed before to the hip.  She is not doing any formalized physical therapy for HEP.  Taking meloxicam 15mg  every day.  She is a type II diabetic, this is diet-controlled. Lab Results  Component Value Date   HGBA1C 6.1 (H) 12/26/2022   PE: There is no bony tenderness of either hip.  There is limited logroll with internal and external rotation bilaterally of the left is more restricted than right.  Positive FADIR.  Imaging: XR HIP UNILAT W OR W/O PELVIS 2-3 VIEWS RIGHT Radiographs of her right hip pelvis demonstrate no acute fracture she does  have advanced degenerative changes of the right hip joint with sclerotic  changes joint space narrowing periarticular osteophytes  Visit Diagnoses:  1. Pain in left hip   2. Bilateral primary osteoarthritis of hip   3. Type 2 diabetes mellitus with obesity (HCC)    Procedures:  Large Joint Inj: L hip joint on 02/09/2023 9:24 AM Indications: pain Details: 22 G 3.5 in needle, ultrasound-guided anterior approach Medications: 4 mL lidocaine 1 %; 40 mg methylPREDNISolone acetate 40 MG/ML Outcome: tolerated well, no immediate complications  Procedure: US-guided intra-articular hip injection, Left After discussion on risks/benefits/indications and informed verbal consent was obtained, a timeout was performed. Patient was lying supine on exam table. The hip was cleaned with betadine and alcohol swabs. Then utilizing ultrasound guidance, the patient's femoral head and neck junction was identified and subsequently injected with 4:1 lidocaine:depomedrol via an  in-plane approach with ultrasound visualization of the injectate administered into the hip joint. Patient tolerated procedure well without immediate complications.  Procedure, treatment alternatives, risks and benefits explained, specific risks discussed. Consent was given by the patient. Immediately prior to procedure a time out was called to verify the correct patient, procedure, equipment, support staff and site/side marked as required. Patient was prepped and draped in the usual sterile fashion.     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  Through shared decision-making, did proceed with ultrasound-guided left hip intra-articular injection, patient tolerated well. -Discussed transient elevation in glucose levels for her to make sure she stays hydrated and eat healthy over the next few days -We will see how the left hip does, but if she would like to consider injection into the right hip we could but likely need to wait about 2 weeks, she will let me know how she is feeling. -It may be smart to get her into formalized physical therapy or at least some home exercise routine, may consider this at future visits -Note provided for work for the next 2 days with postinjection care  Madelyn Brunner, DO Primary Care Sports Medicine Physician  [facility] - Orthopedics  This note was dictated using Dragon naturally speaking software and may contain errors in syntax, spelling, or content which have not been identified prior to signing  this note.

## 2023-02-25 ENCOUNTER — Encounter (HOSPITAL_COMMUNITY): Payer: Self-pay

## 2023-02-25 ENCOUNTER — Emergency Department (HOSPITAL_COMMUNITY)
Admission: EM | Admit: 2023-02-25 | Discharge: 2023-02-25 | Disposition: A | Discharge status: Home / Self Care | Payer: Managed Care, Other (non HMO) | Attending: Emergency Medicine | Admitting: Emergency Medicine

## 2023-02-25 ENCOUNTER — Other Ambulatory Visit: Payer: Self-pay

## 2023-02-25 ENCOUNTER — Emergency Department (HOSPITAL_COMMUNITY): Payer: Managed Care, Other (non HMO)

## 2023-02-25 DIAGNOSIS — S82122B Displaced fracture of lateral condyle of left tibia, initial encounter for open fracture type I or II: Secondary | ICD-10-CM | POA: Diagnosis not present

## 2023-02-25 DIAGNOSIS — E119 Type 2 diabetes mellitus without complications: Secondary | ICD-10-CM | POA: Diagnosis not present

## 2023-02-25 DIAGNOSIS — W0110XA Fall on same level from slipping, tripping and stumbling with subsequent striking against unspecified object, initial encounter: Secondary | ICD-10-CM | POA: Insufficient documentation

## 2023-02-25 DIAGNOSIS — S8992XA Unspecified injury of left lower leg, initial encounter: Secondary | ICD-10-CM | POA: Diagnosis present

## 2023-02-25 DIAGNOSIS — S01511A Laceration without foreign body of lip, initial encounter: Secondary | ICD-10-CM | POA: Diagnosis not present

## 2023-02-25 DIAGNOSIS — Z23 Encounter for immunization: Secondary | ICD-10-CM | POA: Diagnosis not present

## 2023-02-25 DIAGNOSIS — S0993XA Unspecified injury of face, initial encounter: Secondary | ICD-10-CM

## 2023-02-25 DIAGNOSIS — T148XXA Other injury of unspecified body region, initial encounter: Secondary | ICD-10-CM

## 2023-02-25 DIAGNOSIS — W19XXXA Unspecified fall, initial encounter: Secondary | ICD-10-CM

## 2023-02-25 HISTORY — DX: Type 2 diabetes mellitus without complications: E11.9

## 2023-02-25 MED ORDER — SILVER NITRATE-POT NITRATE 75-25 % EX MISC
2.0000 | Freq: Once | CUTANEOUS | Status: DC
  Filled 2023-02-25: qty 2

## 2023-02-25 MED ORDER — CHLORHEXIDINE GLUCONATE 0.12 % MT SOLN: 15.0000 mL | Freq: Two times a day (BID) | OROMUCOSAL | 0 refills | Status: AC

## 2023-02-25 MED ORDER — LIDOCAINE-EPINEPHRINE (PF) 1 %-1:200000 IJ SOLN
5.0000 mL | Freq: Once | INTRAMUSCULAR | Status: DC
  Filled 2023-02-25: qty 10

## 2023-02-25 MED ORDER — LIDOCAINE-EPINEPHRINE (PF) 2 %-1:200000 IJ SOLN
INTRAMUSCULAR | Status: AC
  Administered 2023-02-25: 20 mL
  Filled 2023-02-25: qty 20

## 2023-02-25 MED ORDER — TRANEXAMIC ACID FOR EPISTAXIS
500.0000 mg | Freq: Once | TOPICAL | Status: AC
  Administered 2023-02-25: 500 mg via TOPICAL
  Filled 2023-02-25: qty 10

## 2023-02-25 MED ORDER — TETANUS-DIPHTH-ACELL PERTUSSIS 5-2.5-18.5 LF-MCG/0.5 IM SUSY
0.5000 mL | PREFILLED_SYRINGE | Freq: Once | INTRAMUSCULAR | Status: AC
  Administered 2023-02-25: 0.5 mL via INTRAMUSCULAR
  Filled 2023-02-25: qty 0.5

## 2023-02-25 NOTE — ED Notes (Signed)
Pt provided warm water to swish mouth. Pt appears to have a small pin hole cut in lower right lip. Pt says she fell at [facility]. Pt only has pain in her lip and chronic pain in her hips and knees.

## 2023-02-25 NOTE — Discharge Instructions (Addendum)
Today it is found that you had a lip laceration, we stopped it from bleeding, but it did not require any stitches.  It is likely from the swelling, of your face, given the trauma.  Please use the Peridex twice a day, to help keep the area clean, and prevent infection.  Additionally you have a Tkai Large fracture, on your knee, please use the knee immobilizer and crutches, to help out bear weight.  Please follow-up with orthopedics.  Return if you have any changes in color, or weakness to the leg.  He should take Tylenol, and ibuprofen for pain control for your mouth.

## 2023-02-25 NOTE — Progress Notes (Signed)
Orthopedic Tech Progress Note Patient Details:  Amy Casey Jan 03, 1961 [id number] Patient stated that she does not want crutches at this time.  Ortho Devices Type of Ortho Device: Knee Immobilizer Ortho Device/Splint Location: Left knee Ortho Device/Splint Interventions: Application   Post Interventions Patient Tolerated: Well Instructions Provided: Adjustment of device  Angeliz Settlemyre E Ritvik Mczeal 02/25/2023, 10:24 AM

## 2023-02-25 NOTE — ED Notes (Signed)
Pt ambulated to restroom with steady gait.

## 2023-02-25 NOTE — ED Provider Notes (Signed)
[place] EMERGENCY DEPARTMENT AT [facility] Provider Note   CSN: [id number] Arrival date & time: 02/25/23  0840     History  Chief Complaint  Patient presents with   Mouth Injury    Amy Casey is a 62 y.o. female, history of diabetes type 2, schizophrenia, who presents to the ED secondary to a fall that occurred last night.  She states last night she fell, mechanical fall, after tripping, and she fell and hit her face, and her left knee.  She states her left knee hurts, and that she landed on her knee.  States she had hurt her face, but denies any loss of consciousness, use of blood thinners, nausea, vomiting, or headache.  States that her face hurts a little bit.  Also states that her mouth has been bleeding all night, but she did not want to come in last night because she wanted to sleep.  Unknown last tetanus.  Home Medications Prior to Admission medications   Medication Sig Start Date End Date Taking? Authorizing Provider  chlorhexidine (PERIDEX) 0.12 % solution Use as directed 15 mLs in the mouth or throat 2 (two) times daily for 7 days. 02/25/23 03/04/23 Yes Janequa Kipnis L, PA  acetaminophen (TYLENOL) 500 MG tablet Take 1 tablet (500 mg total) by mouth every 8 (eight) hours as needed. 12/27/22 12/27/23  Tysinger, Kermit Balo, PA-C  amLODipine (NORVASC) 10 MG tablet Take 1 tablet (10 mg total) by mouth in the morning. 12/26/22   Tysinger, Kermit Balo, PA-C  clotrimazole-betamethasone (LOTRISONE) cream Apply 1 Application topically daily. 02/02/23   McDonald, Rachelle Hora, DPM  FLUoxetine (PROZAC) 20 MG capsule Take 20 mg by mouth daily. 12/19/22   [provider]  gabapentin (NEURONTIN) 100 MG capsule Take 1 capsule (100 mg total) by mouth 3 (three) times daily. 12/27/22 12/27/23  Tysinger, Kermit Balo, PA-C  lurasidone (LATUDA) 40 MG TABS tablet Take 40 mg by mouth daily. 12/19/22   [provider]  meloxicam (MOBIC) 15 MG tablet Take 1 tablet (15 mg total) by mouth daily.  02/02/23   McDonald, Rachelle Hora, DPM  rosuvastatin (CRESTOR) 10 MG tablet Take 1 tablet (10 mg total) by mouth daily. 12/26/22 12/26/23  Tysinger, Kermit Balo, PA-C  solifenacin (VESICARE) 10 MG tablet Take 1 tablet (10 mg total) by mouth daily. 12/26/22   Tysinger, Kermit Balo, PA-C  terbinafine (LAMISIL) 250 MG tablet Take 1 tablet (250 mg total) by mouth daily. 02/02/23 05/03/23  Edwin Cap, DPM      Allergies    Paliperidone, Lisinopril, and Sulfa antibiotics    Review of Systems   Review of Systems  Skin:  Positive for wound.    Physical Exam Updated Vital Signs BP (!) 169/114   Pulse 88   Temp 98 F (36.7 C)   Resp 16   Ht 5' 10.5" (1.791 m)   Wt 103.4 kg   SpO2 100%   BMI 32.25 kg/m  Physical Exam Vitals and nursing note reviewed.  Constitutional:      General: She is not in acute distress.    Appearance: She is well-developed.  HENT:     Head: Normocephalic and atraumatic.     Comments: +ttp of R maxilla    Right Ear: Tympanic membrane normal.     Left Ear: Tympanic membrane normal.     Nose:     Comments: No septal hematoma    Mouth/Throat:     Comments: No teeth fractures, +blood in oropharynx, +pinhole  puncture wound to inner lower lip w/blood oozing out Eyes:     Conjunctiva/sclera: Conjunctivae normal.  Cardiovascular:     Rate and Rhythm: Normal rate and regular rhythm.     Heart sounds: No murmur heard. Pulmonary:     Effort: Pulmonary effort is normal. No respiratory distress.     Breath sounds: Normal breath sounds.  Abdominal:     Palpations: Abdomen is soft.     Tenderness: There is no abdominal tenderness.  Musculoskeletal:        General: No swelling.     Cervical back: Neck supple.     Comments: Left Knee: Tenderness to palpation of lateral joint line. An effusion is not present.  Negative anterior and posterior drawer. Negative Mcmurray's. +Patellar stability. Negative valgus and varus stress test. Extension and flexion intact. No sensory deficits.     Skin:    General: Skin is warm and dry.     Capillary Refill: Capillary refill takes less than 2 seconds.  Neurological:     Mental Status: She is alert.  Psychiatric:        Mood and Affect: Mood normal.     ED Results / Procedures / Treatments   Labs (all labs ordered are listed, but only abnormal results are displayed) Labs Reviewed - No data to display  EKG None  Radiology CT Maxillofacial Wo Contrast  Result Date: 02/25/2023 CLINICAL DATA:  Facial trauma.  Mouth injury. EXAM: CT MAXILLOFACIAL WITHOUT CONTRAST TECHNIQUE: Multidetector CT imaging of the maxillofacial structures was performed. Multiplanar CT image reconstructions were also generated. RADIATION DOSE REDUCTION: This exam was performed according to the departmental dose-optimization program which includes automated exposure control, adjustment of the mA and/or kV according to patient size and/or use of iterative reconstruction technique. COMPARISON:  CT head without contrast 03/12/2022 FINDINGS: Osseous: No acute fractures are present. The mandible is intact and located. Orbits: The globes and orbits are within normal limits. Sinuses: The paranasal sinuses and mastoid air cells are clear. Soft tissues: Soft tissue injury to the lower lip is noted. The facial soft tissues are otherwise within normal limits. Limited intracranial: Moderate generalized atrophy and white matter disease is stable. IMPRESSION: 1. Soft tissue injury to the lower lip. 2. No acute fracture or dislocation. 3. Stable moderate generalized atrophy and white matter disease. Electronically Signed   By: Marin Roberts M.D.   On: 02/25/2023 10:01   DG Knee Complete 4 Views Left  Result Date: 02/25/2023 CLINICAL DATA:  Pain after fall 12 hours ago. EXAM: LEFT KNEE - COMPLETE 4 VIEW COMPARISON:  06/14/2021 FINDINGS: Julanne Schlueter bony fragment lateral to the tibial plateau suggestive of avulsion fracture. This was not seen on comparison radiograph. Mild  degenerative marginal spurring with possible mild medial compartment narrowing. No joint effusion. Patellar enthesophyte. IMPRESSION: Cyerra Yim bone fragment lateral to the tibial plateau, new since March 2023 and presumed suggesting avulsion fracture. Electronically Signed   By: Tiburcio Pea M.D.   On: 02/25/2023 09:33    Procedures Procedures    Medications Ordered in ED Medications  silver nitrate applicators applicator 2 Stick (2 Sticks Topical Not Given 02/25/23 1040)  lidocaine-EPINEPHrine (PF) (XYLOCAINE-EPINEPHrine) 1 %-1:200000 (PF) injection 5 mL (5 mLs Infiltration Not Given 02/25/23 1039)  Tdap (BOOSTRIX) injection 0.5 mL (0.5 mLs Intramuscular Given 02/25/23 1038)  tranexamic acid (CYKLOKAPRON) 1000 MG/10ML topical solution 500 mg (500 mg Topical Given 02/25/23 0936)  lidocaine-EPINEPHrine (XYLOCAINE W/EPI) 2 %-1:200000 (PF) injection (20 mLs  Given 02/25/23 1039)  ED Course/ Medical Decision Making/ A&P                                 Medical Decision Making Patient is 62 year old female, who had a mechanical fall yesterday, and has had bleeding in the mouth since then.  She is not on any blood thinners, denies any loss of consciousness, nausea, vomiting, headache since the fall.  She does have bleeding of the mouth, was able to visualize a pinpoint hole in the bottom of the lip, which is likely the source of bleeding.  I used TXA, as well as lidocaine with epi, to help cease the bleeding.  CT max face, as well as knee ordered given tenderness to palpation.  Is able to ambulate/weight-bear.  X-ray ordered, to evaluate for left knee pain.  Amount and/or Complexity of Data Reviewed Radiology: ordered.    Details: There is an avulsion fracture of area lateral of tibial plateau, CT max face only shows soft tissue injury of the lip. Discussion of management or test interpretation with external provider(s): Discussed with Dr. Hulda Humphrey, orthopedist on-call, he recommends putting patient  in immobilizer, follow-up with orthopedics.  Patient able to weight-bear, able to do straight leg raise, thus unlikely be of patellar fracture.  No evidence of any other fracture other than a Tynleigh Birt avulsion fracture.  Provided with crutches, and knee immobilizer.  Instructed follow-up with orthopedics.  Additionally discussed soft tissue injury, I was able to see his bleeding, with TXA, as well as lidocaine with epi.  Bleeding is now controlled.  Believe this is likely secondary to soft tissue swelling.  Tetanus updated given mechanical fall, with exposure to concrete/metal, as well as open wound.  Discussed return precautions, and patient voiced understanding.  Informed to take Tylenol, ibuprofen and use ice, follow-up swelling.  Risk Prescription drug management.    Final Clinical Impression(s) / ED Diagnoses Final diagnoses:  Lip laceration, initial encounter  Fall, initial encounter  Facial injury, initial encounter  Avulsion fracture    Rx / DC Orders ED Discharge Orders          Ordered    chlorhexidine (PERIDEX) 0.12 % solution  2 times daily        02/25/23 1045              Vila Dory, [place], [place] 02/25/23 1055    Rolan Bucco, MD 02/25/23 1539

## 2023-02-25 NOTE — ED Notes (Signed)
Pt states she hasn't taken her BP medication today

## 2023-02-25 NOTE — ED Triage Notes (Signed)
Patient from home after falling and hitting her mouth on the ground yesterday. Bleeding continues from mouth. Did not lose any teeth. Denies blood thinners. No LOC.

## 2023-02-25 NOTE — ED Notes (Signed)
Pt declined crutches she states she has some at home.

## 2023-02-25 NOTE — ED Notes (Signed)
Patient transported to CT 

## 2023-02-25 NOTE — ED Notes (Signed)
Pt ambulated to restroom without incident.

## 2023-04-19 ENCOUNTER — Other Ambulatory Visit: Payer: Self-pay

## 2023-04-19 ENCOUNTER — Emergency Department
Admission: EM | Admit: 2023-04-19 | Discharge: 2023-04-21 | Disposition: A | Discharge status: Other Acute Inpatient Hospital | Payer: Managed Care, Other (non HMO) | Attending: Emergency Medicine | Admitting: Emergency Medicine

## 2023-04-19 ENCOUNTER — Emergency Department: Payer: Managed Care, Other (non HMO)

## 2023-04-19 ENCOUNTER — Encounter: Payer: Self-pay | Admitting: *Deleted

## 2023-04-19 DIAGNOSIS — F22 Delusional disorders: Secondary | ICD-10-CM | POA: Diagnosis not present

## 2023-04-19 DIAGNOSIS — Z1152 Encounter for screening for COVID-19: Secondary | ICD-10-CM | POA: Diagnosis not present

## 2023-04-19 DIAGNOSIS — R410 Disorientation, unspecified: Secondary | ICD-10-CM | POA: Diagnosis present

## 2023-04-19 DIAGNOSIS — E871 Hypo-osmolality and hyponatremia: Secondary | ICD-10-CM | POA: Insufficient documentation

## 2023-04-19 DIAGNOSIS — F200 Paranoid schizophrenia: Secondary | ICD-10-CM | POA: Diagnosis present

## 2023-04-19 LAB — ACETAMINOPHEN LEVEL: Acetaminophen (Tylenol), Serum: <10 ug/mL — ABNORMAL LOW (ref 10–30)

## 2023-04-19 LAB — COMPREHENSIVE METABOLIC PANEL
ALT: 25 U/L (ref 0–44)
AST: 25 U/L (ref 15–41)
Albumin: 4.6 g/dL (ref 3.5–5.0)
Alkaline Phosphatase: 59 U/L (ref 38–126)
Anion gap: 16 — ABNORMAL HIGH (ref 5–15)
BUN: 34 mg/dL — ABNORMAL HIGH (ref 8–23)
CO2: 24 mmol/L (ref 22–32)
Calcium: 9.5 mg/dL (ref 8.9–10.3)
Chloride: 106 mmol/L (ref 98–111)
Creatinine, Ser: 1.2 mg/dL — ABNORMAL HIGH (ref 0.44–1.00)
GFR, Estimated: 51 mL/min — ABNORMAL LOW (ref >60)
Glucose, Bld: 107 mg/dL — ABNORMAL HIGH (ref 70–99)
Potassium: 3.6 mmol/L (ref 3.5–5.1)
Sodium: 146 mmol/L — ABNORMAL HIGH (ref 135–145)
Total Bilirubin: 1 mg/dL (ref 0.0–1.2)
Total Protein: 8 g/dL (ref 6.5–8.1)

## 2023-04-19 LAB — CBC
HCT: 45.4 % (ref 36.0–46.0)
Hemoglobin: 14.4 g/dL (ref 12.0–15.0)
MCH: 29.4 pg (ref 26.0–34.0)
MCHC: 31.7 g/dL (ref 30.0–36.0)
MCV: 92.7 fL (ref 80.0–100.0)
Platelets: 323 10*3/uL (ref 150–400)
RBC: 4.9 MIL/uL (ref 3.87–5.11)
RDW: 12.4 % (ref 11.5–15.5)
WBC: 7.4 10*3/uL (ref 4.0–10.5)
nRBC: 0 % (ref 0.0–0.2)

## 2023-04-19 LAB — SALICYLATE LEVEL: Salicylate Lvl: <7 mg/dL — ABNORMAL LOW (ref 7.0–30.0)

## 2023-04-19 LAB — ETHANOL: Alcohol, Ethyl (B): <10 mg/dL (ref <10)

## 2023-04-19 MED ORDER — LACTATED RINGERS IV BOLUS
1000.0000 mL | Freq: Once | INTRAVENOUS | Status: AC
  Administered 2023-04-20: 1000 mL via INTRAVENOUS

## 2023-04-19 NOTE — ED Triage Notes (Addendum)
Pt ambulatory to triage.  Husband states pt has been off for past 2-3 days.  Hx schizophrenia  pt has tremors.  Pt denies si or hi.  Pt denies etoh use or drug use.  Pt reports taking her meds off and on.  Pt calm and cooperative in triage.

## 2023-04-19 NOTE — ED Notes (Signed)
Pt has rubber bands in her hair.

## 2023-04-19 NOTE — ED Notes (Signed)
Black shoes Gray socks Black shirt Black jacket Grey leggings Stripe cap Gray ring with clear stones Wallace Cullens necklace with pendant 3 rubber bracelets Black shorts Purple tank top Black bra

## 2023-04-19 NOTE — ED Provider Notes (Signed)
[facility] Provider Note    Event Date/Time   First MD Initiated Contact with Patient 04/19/23 2031     (approximate)   History   Psychiatric Evaluation   HPI  Amy Casey is a 63 y.o. female who presents to the ED for evaluation of Psychiatric Evaluation   Review of psychiatric DC summary from 1 year ago.  Admitted to [facility] psych for confusion and behavioral disturbances.  Has history of schizophrenia.  Catatonic at that time requiring treatment for UTI benzodiazepines for catatonia.  Patient presents to the ED from home due to increased paranoia in the past 1-2 days.  History is somewhat limited.  She reports feeling "not right"   Physical Exam   Triage Vital Signs: ED Triage Vitals [04/19/23 1949]  Encounter Vitals Group     BP (!) 189/102     Systolic BP Percentile      Diastolic BP Percentile      Pulse Rate (!) 103     Resp 18     Temp 98.7 F (37.1 C)     Temp Source Oral     SpO2 100 %     Weight 228 lb (103.4 kg)     Height 5\' 10"  (1.778 m)     Head Circumference      Peak Flow      Pain Score 0     Pain Loc      Pain Education      Exclude from Growth Chart     Most recent vital signs: Vitals:   04/19/23 1949 04/19/23 2021  BP: (!) 189/102 (!) 160/102  Pulse: (!) 103 71  Resp: 18 17  Temp: 98.7 F (37.1 C) 97.8 F (36.6 C)  SpO2: 100% 95%    General: Awake, no distress.  Very soft-spoken CV:  Good peripheral perfusion.  Resp:  Normal effort.  Abd:  No distention.  MSK:  No deformity noted.  Neuro:  No focal deficits appreciated. Other:     ED Results / Procedures / Treatments   Labs (all labs ordered are listed, but only abnormal results are displayed) Labs Reviewed  COMPREHENSIVE METABOLIC PANEL - Abnormal; Notable for the following components:      Result Value   Sodium 146 (*)    Glucose, Bld 107 (*)    BUN 34 (*)    Creatinine, Ser 1.20 (*)    GFR, Estimated 51 (*)    Anion gap 16 (*)     All other components within normal limits  SALICYLATE LEVEL - Abnormal; Notable for the following components:   Salicylate Lvl <7.0 (*)    All other components within normal limits  ACETAMINOPHEN LEVEL - Abnormal; Notable for the following components:   Acetaminophen (Tylenol), Serum <10 (*)    All other components within normal limits  RESP PANEL BY RT-PCR (RSV, FLU A&B, COVID)  RVPGX2  ETHANOL  CBC  URINE DRUG SCREEN, QUALITATIVE ([facility] ONLY)  URINALYSIS, ROUTINE W REFLEX MICROSCOPIC    EKG   RADIOLOGY CT head interpreted by me without evidence of acute intracranial pathology  Official radiology report(s): CT HEAD WO CONTRAST ( ) Result Date: 04/19/2023 CLINICAL DATA:  Altered mental status. EXAM: CT HEAD WITHOUT CONTRAST TECHNIQUE: Contiguous axial images were obtained from the base of the skull through the vertex without intravenous contrast. RADIATION DOSE REDUCTION: This exam was performed according to the departmental dose-optimization program which includes automated exposure control, adjustment of the mA and/or kV according  to patient size and/or use of iterative reconstruction technique. COMPARISON:  03/12/2022 FINDINGS: Brain: There is atrophy and chronic small vessel disease changes. No acute intracranial abnormality. Specifically, no hemorrhage, hydrocephalus, mass lesion, acute infarction, or significant intracranial injury. Vascular: No hyperdense vessel or unexpected calcification. Skull: No acute calvarial abnormality. Sinuses/Orbits: No acute findings Other: None IMPRESSION: Atrophy, chronic microvascular disease. No acute intracranial abnormality. Electronically Signed   By: Charlett Nose M.D.   On: 04/19/2023 23:04    PROCEDURES and INTERVENTIONS:  Procedures  Medications  lactated ringers bolus 1,000 mL (has no administration in time range)     IMPRESSION / MDM / ASSESSMENT AND PLAN / ED COURSE  I reviewed the triage vital signs and the nursing  notes.  Differential diagnosis includes, but is not limited to, metabolic encephalopathy such as a UTI or sepsis, medication noncompliance, acute psychoses, catatonia  {Patient presents with symptoms of an acute illness or injury that is potentially life-threatening.  Patient with history of schizophrenia presents with altered mentation and paranoia.  Initially very poor history from the patient and from the husband.  He drops her off and may have to call him repeatedly to get at a Simp[place]ance of a history, largely reporting increased paranoia over the past couple days.  She is not catatonic here but quite soft-spoken.  Awaiting UA to rule out metabolic encephalopathy in the setting of her history.  Scan of her head and remainder of blood work is benign with a normal CBC, no signs of sepsis.  Very marginal hyponatremia, gap of 16.  Attempted give some IV fluids for this but unable to get an IV and she is tolerating p.o. just fine.  Signed out to oncoming physician to follow-up on a UA to evaluate for medical pathology.  Otherwise suitable for psychiatric evaluation  Clinical Course as of 04/19/23 2328  Wed Apr 19, 2023  2115 I call husband, clarance Huskins.  [DS]    Clinical Course User Index [DS] Delton Prairie, MD     FINAL CLINICAL IMPRESSION(S) / ED DIAGNOSES   Final diagnoses:  Paranoia (HCC)     Rx / DC Orders   ED Discharge Orders     None        Note:  This document was prepared using Dragon voice recognition software and may include unintentional dictation errors.   Delton Prairie, MD 04/19/23 2329

## 2023-04-20 ENCOUNTER — Encounter: Payer: Self-pay | Admitting: Psychiatry

## 2023-04-20 LAB — RESP PANEL BY RT-PCR (RSV, FLU A&B, COVID)  RVPGX2
Influenza A by PCR: NEGATIVE
Influenza B by PCR: NEGATIVE
Resp Syncytial Virus by PCR: NEGATIVE
SARS Coronavirus 2 by RT PCR: NEGATIVE

## 2023-04-20 LAB — URINE DRUG SCREEN, QUALITATIVE (ARMC ONLY)
Amphetamines, Ur Screen: NOT DETECTED
Barbiturates, Ur Screen: NOT DETECTED
Benzodiazepine, Ur Scrn: NOT DETECTED
Cannabinoid 50 Ng, Ur ~~LOC~~: NOT DETECTED
Cocaine Metabolite,Ur ~~LOC~~: NOT DETECTED
MDMA (Ecstasy)Ur Screen: NOT DETECTED
Methadone Scn, Ur: NOT DETECTED
Opiate, Ur Screen: NOT DETECTED
Phencyclidine (PCP) Ur S: NOT DETECTED
Tricyclic, Ur Screen: NOT DETECTED

## 2023-04-20 LAB — URINALYSIS, ROUTINE W REFLEX MICROSCOPIC
Bacteria, UA: NONE SEEN
Bilirubin Urine: NEGATIVE
Glucose, UA: NEGATIVE mg/dL
Ketones, ur: 20 mg/dL — AB
Leukocytes,Ua: NEGATIVE
Nitrite: NEGATIVE
Protein, ur: 30 mg/dL — AB
Specific Gravity, Urine: 1.023 (ref 1.005–1.030)
pH: 5 (ref 5.0–8.0)

## 2023-04-20 MED ORDER — ALBUTEROL SULFATE (2.5 MG/3ML) 0.083% IN NEBU: 2.5000 mg | INHALATION_SOLUTION | Freq: Four times a day (QID) | RESPIRATORY_TRACT | Status: DC | PRN

## 2023-04-20 MED ORDER — ACETAMINOPHEN 500 MG PO TABS: 500.0000 mg | ORAL_TABLET | Freq: Three times a day (TID) | ORAL | Status: DC | PRN

## 2023-04-20 MED ORDER — NAPROXEN 375 MG PO TABS
375.0000 mg | ORAL_TABLET | Freq: Three times a day (TID) | ORAL | Status: DC
  Administered 2023-04-20 – 2023-04-21 (×2): 375 mg via ORAL
  Filled 2023-04-20 (×4): qty 1

## 2023-04-20 MED ORDER — FLUOXETINE HCL 20 MG PO CAPS
20.0000 mg | ORAL_CAPSULE | Freq: Every day | ORAL | Status: DC
  Administered 2023-04-20 – 2023-04-21 (×2): 20 mg via ORAL
  Filled 2023-04-20 (×2): qty 1

## 2023-04-20 MED ORDER — ROSUVASTATIN CALCIUM 10 MG PO TABS
10.0000 mg | ORAL_TABLET | Freq: Every day | ORAL | Status: DC
  Administered 2023-04-20 – 2023-04-21 (×2): 10 mg via ORAL
  Filled 2023-04-20 (×2): qty 1

## 2023-04-20 MED ORDER — LORAZEPAM 2 MG/ML IJ SOLN
0.5000 mg | Freq: Once | INTRAMUSCULAR | Status: AC | PRN
  Administered 2023-04-20: 0.5 mg via INTRAVENOUS
  Filled 2023-04-20: qty 1

## 2023-04-20 MED ORDER — ALBUTEROL SULFATE HFA 108 (90 BASE) MCG/ACT IN AERS: 2.0000 | INHALATION_SPRAY | Freq: Four times a day (QID) | RESPIRATORY_TRACT | Status: DC | PRN

## 2023-04-20 MED ORDER — GABAPENTIN 100 MG PO CAPS
100.0000 mg | ORAL_CAPSULE | Freq: Three times a day (TID) | ORAL | Status: DC
  Administered 2023-04-20 – 2023-04-21 (×2): 100 mg via ORAL
  Filled 2023-04-20 (×3): qty 1

## 2023-04-20 MED ORDER — TERBINAFINE HCL 250 MG PO TABS
250.0000 mg | ORAL_TABLET | Freq: Every day | ORAL | Status: DC
  Administered 2023-04-20 – 2023-04-21 (×2): 250 mg via ORAL
  Filled 2023-04-20 (×2): qty 1

## 2023-04-20 MED ORDER — AMLODIPINE BESYLATE 5 MG PO TABS
10.0000 mg | ORAL_TABLET | Freq: Every morning | ORAL | Status: DC
  Administered 2023-04-21: 10 mg via ORAL
  Filled 2023-04-20: qty 2

## 2023-04-20 MED ORDER — LURASIDONE HCL 40 MG PO TABS
40.0000 mg | ORAL_TABLET | Freq: Every day | ORAL | Status: DC
  Administered 2023-04-20 – 2023-04-21 (×2): 40 mg via ORAL
  Filled 2023-04-20 (×2): qty 1

## 2023-04-20 NOTE — ED Notes (Addendum)
This RN spoke with Amy Casey, she stated plan was to get Pt into Geri-psych but still waiting on coordinator for Geri-psych to confirm placement and that Pt has met all their expectations. She stated she will let this RN know if anything changes.

## 2023-04-20 NOTE — ED Notes (Signed)
BH Breakfast tray given.

## 2023-04-20 NOTE — ED Notes (Addendum)
[employer] health coordinator @ bedside. Pt was awaking from sleep and seemed a little out of it per [place]. She would like to know if Pt can perform ADLs such as getting up and ambulating. This RN will assess and secure chat [place].

## 2023-04-20 NOTE — BH Assessment (Signed)
Per [facility] Sammuel Bailiff S.), patient to be referred out of system.  Referral information for Psychiatric Hospitalization faxed to;   [facility] ([phone]), No appropriate bed.  [facility] [phone]),   [facility] ([phone]--[phone])  [facility] [phone] -[phone]) [phone]  [facility] [phone]),  [place] [phone], [phone], [phone] or [phone]),   [place] [phone]--- [phone]--- [phone]--- [phone])  [address] [phone]),   [facility] [phone] -or- [phone]),   [facility] [phone] phone-- [phone])  [facility] [phone]),  [place] [phone])  [place] [phone] or [phone]),   [facility] [phone]).  [facility] [phone]).

## 2023-04-20 NOTE — ED Provider Notes (Signed)
-----------------------------------------   9:47 PM on 04/20/2023 ----------------------------------------- Accepting facility requesting patient be placed under IVC as she does not seem to be able to consent to treatment in her current condition.  IVC seems reasonable given acute psychosis, patient represents a threat to herself given severe paranoia and unwillingness to eat or drink anything.   Chesley Noon, MD 04/20/23 2147
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
## 2023-04-20 NOTE — ED Notes (Signed)
BH dinner tray refused.

## 2023-04-20 NOTE — ED Notes (Signed)
Husband, [name] called and this RN gave an update based on last psych note. This RN confirmed with husband that he is her legal guardian and so is Pt's sister. This RN asked him to bring in the paperwork so he can sign consents for admission, if needed. [name] agreed and he said he would bring in paperwork.

## 2023-04-20 NOTE — ED Notes (Addendum)
Pt visibly soiled in brief, switched beds and provided pericare with RN-Tracey. Pt able to ambulate with 1 assist and wipe herself, as well as put on her own brief and pull up her own pants. Pt ambulated with 1 standby coaching assist to bed. [employer] notified.

## 2023-04-20 NOTE — BH Assessment (Addendum)
Referral checks;    [facility] ([id number]- [phone]), No appropriate bed.   [facility] [phone]),    [facility] ([phone]--[phone])   [facility] [phone] -[phone]) [phone]   [facility] [phone]),   [facility] [phone], [phone], [phone] or [phone]),    [facility] [phone]--- [phone]--- [phone])   [address] [phone]),    [facility] [phone] -or- [phone]),    [facility] [phone] phone-- [phone])   [facility] [phone]), Pending admission, facility is waiting on IVC paperwork to be sent. Update 11:25pm-IVC paperwork faxed to facility, awaiting accepting information   [facility] [phone])   [facility] [phone] or [phone]),    [facility] [phone]).   [facility] [phone]).

## 2023-04-20 NOTE — ED Notes (Signed)
Pt refused BH safe snack.

## 2023-04-20 NOTE — ED Notes (Addendum)
BH safe lunch tray/drink refused by Pt.

## 2023-04-20 NOTE — ED Notes (Signed)
All legal documents scanned and e-filed

## 2023-04-20 NOTE — ED Provider Notes (Signed)
-----------------------------------------   1:41 AM on 04/20/2023 -----------------------------------------   UA/UDS negative for infection, positive for ketonuria.  TTS has evaluated patient who is now waiting for psychiatrist.  ----------------------------------------- 3:54 AM on 04/20/2023 -----------------------------------------   Patient seen by psychiatry who recommends inpatient admission.   Irean Hong, MD 04/20/23 [phone]

## 2023-04-20 NOTE — ED Notes (Signed)
Called.

## 2023-04-20 NOTE — ED Notes (Addendum)
Update given to patient's husband: [name] [phone] was given permission by husband to speak to the patient's daughter Wilfred Lacy to give her an update as well.  Update given to patient's daughter Carlynn Purl [phone] per Wilfred Lacy, the patient has been like this for about 11-12 days, and goes through cycles of these sxs. Daughter states usually something stressful to the patient triggers the patient to stop eating and drinking and has hx of paranoid schizophrenia where she is thinking there is something in her food and drink and also won't eat or drink because she is worried she will soil herself.    Daughter states the patient's sister Chyrel Masson [phone] can also give her information on patient if needed.  Informed both and husband and daughter that the psychiatry team is working on a plan and most likely more will be communicated during the day shift.   Daughter states patient usually goes to  or [facility] and states she has been admitted to [facility] behavioral health in the past. Daughter also stated that when patient is not having an "episode" she can take care of herself and walk and talk normally.    Daughter also stated the patient DOES NOT HAVE A LEGAL GUARDIAN.

## 2023-04-20 NOTE — ED Notes (Signed)
EDP notified Pt refusing food and drink. Notified Pt not catatonic, she is arousable, but will ramble random sentences.

## 2023-04-20 NOTE — ED Notes (Signed)
VOL  PENDING  PLACEMENT 

## 2023-04-20 NOTE — ED Notes (Signed)
VOL/Inpt psych admission recommended

## 2023-04-20 NOTE — ED Notes (Addendum)
This RN checked on Pt and she was more awake then when [employer] health coordinator visited. This RN attempted to ambulated [name], however Pt only wanted to sit on the bed, and stated "I can stand and walk but I want to sit here." [name] notified.

## 2023-04-20 NOTE — ED Notes (Signed)
Declines snack, drink at this time.

## 2023-04-20 NOTE — BH Assessment (Addendum)
Comprehensive Clinical Assessment (CCA) Note  04/20/2023 Amy Casey [id number]  Chief Complaint: Patient is a 63 year old female presenting to [facility] ED voluntarily. Per triage note Pt ambulatory to triage.  Husband states pt has been off for past 2-3 days.  Hx schizophrenia  pt has tremors.  Pt denies si or hi.  Pt denies etoh use or drug use.  Pt reports taking her meds off and on.  Pt calm and cooperative in triage. During assessment patient appears alert and oriented x2, calm and cooperative, affect is flat, her speech is soft and slow, she often only responds with "somewhat", she is currently laying down in her bed with minimum movements, when asking the patient questions it appears that she is speaking softly but not responding to the question until she is able to be redirected. When asked if patient knows where she is she is able to respond with "hospital." This writer asked if the patient has been feeling paranoid as if people were out to get her, she responds with "somewhat." When asked if the patient has been taking her medications she reports "not all of them," when asked why the patient is unable to respond. In regards to her sleep she reports "poor, somewhat" and reports a "fair" appetite. Patient is able to deny current SI/HI/AH/VH.  Collateral information provided by patient's husband Mr. Longenecker [phone] "she started to be paranoid, hearing voices, she was saying she isn't supposed to be here and telling me to leave like she was scared or something, it started 3 or 4 days ago and the medicine she's been taking from [facility] in [place] it was working but I guess she missed a few days, I have to make sure she takes it, then she said no I'm not taking it and I told her she can't stop taking it." "She feels like she has to control everything and feels paranoid and then she shuts down, she was working but I took her out of work because of the way she has been acting now."  Husband reports that it is normal for the patient to be soft spoken "she'll speak up when she gets more comfortable with you."  Chief Complaint  Patient presents with   Psychiatric Evaluation   Visit Diagnosis: Paranoid Schizophrenia per history    CCA Screening, Triage and Referral (STR)  Patient Reported Information How did you hear about [place]? Family/Friend  Referral name: No data recorded Referral phone number: No data recorded  Whom do you see for routine medical problems? No data recorded Practice/Facility Name: No data recorded Practice/Facility Phone Number: No data recorded Name of Contact: No data recorded Contact Number: No data recorded Contact Fax Number: No data recorded Prescriber Name: No data recorded Prescriber Address (if known): No data recorded  What Is the Reason for Your Visit/Call Today? Pt ambulatory to triage.  Husband states pt has been off for past 2-3 days.  Hx schizophrenia  pt has tremors.  Pt denies si or hi.  Pt denies etoh use or drug use.  Pt reports taking her meds off and on.  Pt calm and cooperative in triage.  How Long Has This Been Causing You Problems? > than 6 months  What Do You Feel Would Help You the Most Today? No data recorded  Have You Recently Been in Any Inpatient Treatment (Hospital/Detox/Crisis Center/28-Day Program)? No data recorded Name/Location of Program/Hospital:No data recorded How Long Were You There? No data recorded When Were You Discharged? No  data recorded  Have You Ever Received Services From [facility] Before? No data recorded Who Do You See at [facility]? No data recorded  Have You Recently Had Any Thoughts About Hurting Yourself? No  Are You Planning to Commit Suicide/Harm Yourself At This time? No   Have you Recently Had Thoughts About Hurting Someone Karolee Ohs? No  Explanation: No data recorded  Have You Used Any Alcohol or Drugs in the Past 24 Hours? No  How Long Ago Did You Use Drugs or Alcohol? No  data recorded What Did You Use and How Much? No data recorded  Do You Currently Have a Therapist/Psychiatrist? No data recorded Name of Therapist/Psychiatrist: No data recorded  Have You Been Recently Discharged From Any Office Practice or Programs? No  Explanation of Discharge From Practice/Program: No data recorded    CCA Screening Triage Referral Assessment Type of Contact: Face-to-Face  Is this Initial or Reassessment? No data recorded Date Telepsych consult ordered in [facility]:  No data recorded Time Telepsych consult ordered in [facility]:  No data recorded  Patient Reported Information Reviewed? No data recorded Patient Left Without Being Seen? No data recorded Reason for Not Completing Assessment: No data recorded  Collateral Involvement: No data recorded  Does Patient Have a Court Appointed Legal Guardian? No data recorded Name and Contact of Legal Guardian: No data recorded If Minor and Not Living with Parent(s), Who has Custody? No data recorded Is CPS involved or ever been involved? Never  Is APS involved or ever been involved? Never   Patient Determined To Be At Risk for Harm To Self or Others Based on Review of Patient Reported Information or Presenting Complaint? No data recorded Method: No data recorded Availability of Means: No data recorded Intent: No data recorded Notification Required: No data recorded Additional Information for Danger to Others Potential: No data recorded Additional Comments for Danger to Others Potential: No data recorded Are There Guns or Other Weapons in Your Home? No data recorded Types of Guns/Weapons: No data recorded Are These Weapons Safely Secured?                            No data recorded Who Could Verify You Are Able To Have These Secured: No data recorded Do You Have any Outstanding Charges, Pending Court Dates, Parole/Probation? No data recorded Contacted To Inform of Risk of Harm To Self or Others: No data recorded  Location of  Assessment: [facility] ED   Does Patient Present under Involuntary Commitment? No  IVC Papers Initial File Date: No data recorded  [place] of Residence: [place]   Patient Currently Receiving the Following Services: Medication Management   Determination of Need: Emergent (2 hours)   Options For Referral: No data recorded    CCA Biopsychosocial Intake/Chief Complaint:  No data recorded Current Symptoms/Problems: No data recorded  Patient Reported Schizophrenia/Schizoaffective Diagnosis in Past: Yes   Strengths: Patient has the support of her husband  Preferences: No data recorded Abilities: No data recorded  Type of Services Patient Feels are Needed: No data recorded  Initial Clinical Notes/Concerns: No data recorded  Mental Health Symptoms Depression:  Change in energy/activity; Fatigue; Hopelessness   Duration of Depressive symptoms: Greater than two weeks   Mania:  None   Anxiety:   None   Psychosis:  Affective flattening/alogia/avolition; Grossly disorganized or catatonic behavior   Duration of Psychotic symptoms: Greater than six months   Trauma:  None   Obsessions:  Poor insight   Compulsions:  None   Inattention:  None   Hyperactivity/Impulsivity:  None   Oppositional/Defiant Behaviors:  None   Emotional Irregularity:  None   Other Mood/Personality Symptoms:  No data recorded   Mental Status Exam Appearance and self-care  Stature:  Average   Weight:  Thin   Clothing:  Casual   Grooming:  Normal   Cosmetic use:  None   Posture/gait:  Slumped   Motor activity:  Slowed   Sensorium  Attention:  Distractible   Concentration:  Preoccupied   Orientation:  Person; Place   Recall/memory:  Defective in Short-term; Defective in Recent   Affect and Mood  Affect:  Flat   Mood:  Depressed; Hopeless   Relating  Eye contact:  Avoided   Facial expression:  Responsive   Attitude toward examiner:  Cooperative   Thought and Language   Speech flow: Soft; Slow   Thought content:  No data recorded  Preoccupation:  Other (Comment)   Hallucinations:  Other (Comment) (Patient denies but could be responding to internal stimuli)   Organization:  No data recorded  [employer] of Knowledge:  Fair   Intelligence:  Average   Abstraction:  Functional   Judgement:  Impaired; Poor   Reality Testing:  Unaware   Insight:  Lacking; Poor   Decision Making:  Confused; Paralyzed   Social Functioning  Social Maturity:  Isolates   Social Judgement:  Normal   Stress  Stressors:  Illness   Coping Ability:  Contractor Deficits:  None   Supports:  Family     Religion: Religion/Spirituality Are You A Religious Person?: No  Leisure/Recreation: Leisure / Recreation Do You Have Hobbies?: No  Exercise/Diet: Exercise/Diet Do You Exercise?: No Have You Gained or Lost A Significant Amount of Weight in the Past Six Months?: No Do You Follow a Special Diet?: No Do You Have Any Trouble Sleeping?: Yes Explanation of Sleeping Difficulties: Patient reports "poor sleep, somewhat"   CCA Employment/Education Employment/Work Situation: Employment / Work Situation Employment Situation: Unemployed Has Patient ever Been in Equities trader?: No  Education: Education Is Patient Currently Attending School?: No Did You Have An Individualized Education Program (IIEP): No Did You Have Any Difficulty At [employer]?: No Patient's Education Has Been Impacted by Current Illness: No   CCA Family/Childhood History Family and Relationship History: Family history Marital status: Married Number of Years Married:  (Unknown) What types of issues is patient dealing with in the relationship?: UTA Additional relationship information: UTA Does patient have children?:  (UTA)  Childhood History:  Childhood History Did patient suffer any verbal/emotional/physical/sexual abuse as a child?:  (UTA) Did patient suffer from  severe childhood neglect?:  (UTA) Has patient ever been sexually abused/assaulted/raped as an adolescent or adult?:  (UTA) Was the patient ever a victim of a crime or a disaster?:  (UTA) Witnessed domestic violence?:  (UTA) Has patient been affected by domestic violence as an adult?:  Industrial/product designer)  Child/Adolescent Assessment:     CCA Substance Use Alcohol/Drug Use: Alcohol / Drug Use Pain Medications: see mar Prescriptions: see mar Over the Counter: see mar History of alcohol / drug use?: No history of alcohol / drug abuse                         ASAM's:  Six Dimensions of Multidimensional Assessment  Dimension 1:  Acute Intoxication and/or Withdrawal Potential:      Dimension 2:  Biomedical Conditions and Complications:      Dimension 3:  Emotional, Behavioral, or Cognitive Conditions and Complications:     Dimension 4:  Readiness to Change:     Dimension 5:  Relapse, Continued use, or Continued Problem Potential:     Dimension 6:  Recovery/Living Environment:     ASAM Severity Score:    ASAM Recommended Level of Treatment:     Substance use Disorder (SUD)    Recommendations for Services/Supports/Treatments:    DSM5 Diagnoses: Patient Active Problem List   Diagnosis Date Noted   Bilateral primary osteoarthritis of hip 02/02/2023   Enlarged and hypertrophic nails 12/26/2022   Left hip pain 12/26/2022   Diabetes mellitus (HCC) 12/26/2022   Type 2 diabetes mellitus with obesity (HCC) 03/12/2022   Impaired fasting blood sugar 09/07/2021   Need for influenza vaccination 02/23/2021   Screening for diabetes mellitus 02/23/2021   Edema 02/23/2021   Weight gain 02/23/2021   Elevated uric acid in blood 02/23/2021   Need for pneumococcal vaccination 02/23/2021   Screen for colon cancer 02/23/2021   Paresthesia 02/23/2021   Cerebrovascular disease 12/31/2020   Vaccine refused by patient 12/31/2020   Hyperlipidemia 12/31/2020   Overactive bladder 07/22/2019    Vaccine counseling 07/22/2019   H/O positive serological reaction for syphilis 05/24/2019   Essential hypertension 11/06/2018   Encounter for health maintenance examination in adult 08/21/2018   Catatonia 08/16/2018   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

## 2023-04-21 NOTE — ED Notes (Signed)
RN returned phone call to [employer], Spouse, [phone]. No answer, unable to leave VM

## 2023-04-21 NOTE — ED Notes (Signed)
Pt voided in brief, this tech and EDT Darneshia changed pt brief. Pt is clean and dry.

## 2023-04-21 NOTE — ED Notes (Signed)
Daughter laneva and Sister [name] updated, [name] lives nearby and would like to come visit patient at breakfast time before leaving to help her with eating and drinking. Informed her I would pass on to day shift nurse to arrange for early visitation.

## 2023-04-21 NOTE — ED Notes (Signed)
RN attempted to call report [phone], no answer.

## 2023-04-21 NOTE — ED Notes (Addendum)
Amy Casey Sister [phone] Will come to visit at [facility] when enough staff available for visit to facilitate pt eating breakfast.

## 2023-04-21 NOTE — ED Notes (Signed)
Hospital meal provided.  Pt refusing at this time.

## 2023-04-21 NOTE — ED Notes (Signed)
RN attempted to call report to [phone], no answer. Unable to leave VM

## 2023-04-21 NOTE — ED Notes (Addendum)
Amy Casey Spouse Emergency Contact [phone]  Gives instructions to send pt belongings home with sister, Amy Casey who is visiting with patient.  Amy Casey Sister [phone] Here to visit with patient, she is beneficial in assisting patient eat and take her medications this AM

## 2023-04-21 NOTE — ED Notes (Addendum)
Sister and RN assisted pt to complete bed bath and full clothing change. Pt ate 25% of food with sister. Pt able to complete ADL's independently with much verbal encouragement and reassurance

## 2023-04-21 NOTE — BH Assessment (Addendum)
PATIENT BED AVAILABLE AFTER 7AM ON 04/21/23  Patient has been accepted to [facility]  Patient assigned to room 1105 Accepting physician is Dr. Geradine Girt.  Call report to [phone].  Representative was [employer].   ER Staff is aware of it:  [facility] ER Secretary  Dr. York Cerise, ER MD  [name] Patient's Nurse     Patient's Family/Support System Amy Casey [phone]-Patient's daughter) have been updated as well. Patient's daughter provided with the name and phone number of the location and is receptive. Attempted to contact the patient's husband Amy Casey [phone] but no answer  Address: [address], [place] [address]  Please have phone numbers of patient's husband and daughter with the patients belongings

## 2023-04-21 NOTE — ED Notes (Signed)
Lunch provided to patient

## 2023-04-21 NOTE — ED Notes (Signed)
PATIENT BED AVAILABLE AFTER 7AM ON 04/21/23   Patient has been accepted to [facility].  Patient assigned to room 1105 Accepting physician is Dr. Geradine Girt.  Call report to [phone].  Representative was [employer].

## 2023-05-05 ENCOUNTER — Telehealth: Payer: Self-pay

## 2023-05-05 NOTE — Transitions of Care (Post Inpatient/ED Visit) (Signed)
   05/05/2023  Name: Amy Casey MRN: [id number] DOB: 1960-12-26  Today's TOC FU Call Status: Today's TOC FU Call Status:: Unsuccessful Call (1st Attempt) Unsuccessful Call (1st Attempt) Date: 05/05/23  Attempted to reach the patient regarding the most recent Inpatient/ED visit.  Follow Up Plan: Additional outreach attempts will be made to reach the patient to complete the Transitions of Care (Post Inpatient/ED visit) call.   Medford Balboa, BSN, RN [place]  VBCI - [employer] [facility] RN Care Manager [phone]

## 2023-05-08 ENCOUNTER — Ambulatory Visit: Payer: Managed Care, Other (non HMO) | Admitting: Podiatry

## 2023-09-22 ENCOUNTER — Emergency Department (HOSPITAL_COMMUNITY)
Admission: EM | Admit: 2023-09-22 | Discharge: 2023-09-23 | Disposition: A | Discharge status: Home / Self Care | Attending: Emergency Medicine | Admitting: Emergency Medicine

## 2023-09-22 ENCOUNTER — Encounter (HOSPITAL_COMMUNITY): Payer: Self-pay | Admitting: *Deleted

## 2023-09-22 ENCOUNTER — Emergency Department (HOSPITAL_COMMUNITY)

## 2023-09-22 ENCOUNTER — Other Ambulatory Visit: Payer: Self-pay

## 2023-09-22 DIAGNOSIS — Z7984 Long term (current) use of oral hypoglycemic drugs: Secondary | ICD-10-CM | POA: Insufficient documentation

## 2023-09-22 DIAGNOSIS — M25561 Pain in right knee: Secondary | ICD-10-CM | POA: Diagnosis present

## 2023-09-22 DIAGNOSIS — E119 Type 2 diabetes mellitus without complications: Secondary | ICD-10-CM | POA: Insufficient documentation

## 2023-09-22 DIAGNOSIS — Z79899 Other long term (current) drug therapy: Secondary | ICD-10-CM | POA: Insufficient documentation

## 2023-09-22 DIAGNOSIS — M25562 Pain in left knee: Secondary | ICD-10-CM | POA: Insufficient documentation

## 2023-09-22 DIAGNOSIS — G8929 Other chronic pain: Secondary | ICD-10-CM | POA: Diagnosis not present

## 2023-09-22 DIAGNOSIS — I10 Essential (primary) hypertension: Secondary | ICD-10-CM | POA: Insufficient documentation

## 2023-09-22 DIAGNOSIS — N3000 Acute cystitis without hematuria: Secondary | ICD-10-CM | POA: Insufficient documentation

## 2023-09-22 DIAGNOSIS — E876 Hypokalemia: Secondary | ICD-10-CM | POA: Diagnosis not present

## 2023-09-22 LAB — URINALYSIS, ROUTINE W REFLEX MICROSCOPIC
Bilirubin Urine: NEGATIVE
Glucose, UA: NEGATIVE mg/dL
Hgb urine dipstick: NEGATIVE
Ketones, ur: NEGATIVE mg/dL
Nitrite: NEGATIVE
Protein, ur: 30 mg/dL — AB
Specific Gravity, Urine: 1.025 (ref 1.005–1.030)
pH: 5 (ref 5.0–8.0)

## 2023-09-22 LAB — I-STAT CHEM 8, ED
BUN: 28 mg/dL — ABNORMAL HIGH (ref 8–23)
Calcium, Ion: 1.15 mmol/L (ref 1.15–1.40)
Chloride: 105 mmol/L (ref 98–111)
Creatinine, Ser: 1.2 mg/dL — ABNORMAL HIGH (ref 0.44–1.00)
Glucose, Bld: 94 mg/dL (ref 70–99)
HCT: 43 % (ref 36.0–46.0)
Hemoglobin: 14.6 g/dL (ref 12.0–15.0)
Potassium: 3 mmol/L — ABNORMAL LOW (ref 3.5–5.1)
Sodium: 145 mmol/L (ref 135–145)
TCO2: 28 mmol/L (ref 22–32)

## 2023-09-22 NOTE — ED Triage Notes (Signed)
 The pt fell earlier today when her rt knee gave way  she is also c/o lt knee pain she has had multiple falls  in  the past 3 months

## 2023-09-22 NOTE — ED Provider Triage Note (Signed)
 Emergency Medicine Provider Triage Evaluation Note  Amy Casey , a 63 y.o. female  was evaluated in triage.  Pt complains of fall. Sts her R knee gave out today and she fell.  Denies hitting head of LOC.  States she fell a few days prior when she tripped and hit her L knee.  She request xrays of her knees.  No other changes  Review of Systems  Positive: As above Negative: As above  Physical Exam  BP 130/89 (BP Location: Right Arm)   Pulse 83   Temp 97.8 F (36.6 C)   Resp 18   Ht 5' 10 (1.778 m)   Wt 103.4 kg   SpO2 100%   BMI 32.71 kg/m  Gen:   Awake, no distress   Resp:  Normal effort  MSK:   Moves extremities without difficulty  Other:    Medical Decision Making  Medically screening exam initiated at 8:45 PM.  Appropriate orders placed.  Amy Casey was informed that the remainder of the evaluation will be completed by another provider, this initial triage assessment does not replace that evaluation, and the importance of remaining in the ED until their evaluation is complete.     Nivia Colon, PA-C 09/22/23 2048

## 2023-09-23 MED ORDER — CEPHALEXIN 500 MG PO CAPS
500.0000 mg | ORAL_CAPSULE | Freq: Two times a day (BID) | ORAL | 0 refills | Status: DC
Start: 2023-09-23 — End: 2023-12-20

## 2023-09-23 MED ORDER — POTASSIUM CHLORIDE CRYS ER 20 MEQ PO TBCR: 20.0000 meq | EXTENDED_RELEASE_TABLET | Freq: Two times a day (BID) | ORAL | 0 refills | Status: DC

## 2023-09-23 NOTE — Progress Notes (Signed)
 Orthopedic Tech Progress Note Patient Details:  Amy Casey [facility] 1961-03-08 [id number]  Ortho Devices Type of Ortho Device: Knee Sleeve, ASO Ortho Device/Splint Location: RLE Ortho Device/Splint Interventions: Application   Post Interventions Patient Tolerated: Well  Massie BRAVO Zharia Conrow 09/23/2023, 8:43 AM

## 2023-09-23 NOTE — Discharge Instructions (Addendum)
 You were evaluated in the emergency room for bilateral leg pain.  You are found to have bilateral knee arthritis.  Please call the number on the sheet to follow-up with orthopedics.  You were noted to have a low potassium.  Prescription for supplementation was sent into your pharmacy.  You were additionally noted to have a urinary tract infection.  Please complete the full course of antibiotics.  I would follow-up with your primary care doctor to ensure the symptoms are improving.

## 2023-09-23 NOTE — ED Provider Notes (Signed)
 [place] [facility] AT [facility] Provider Note   CSN: [id number] Arrival date & time: 09/22/23  1849     Patient presents with: Knee Pain   Amy Casey is a 63 y.o. female who presents with bilateral knee pain for the past several months.  Patient denies any injury or trauma.  States her knees will give out.  Denies significant fall and hitting her head.  States the pain radiates down.  She does have a history of bilateral knee arthritis and has received cortisone injections in the past.  She has no history of DVT.  No history of cancer, recent surgeries or extended travel.    Knee Pain     Past Medical History:  Diagnosis Date   Depression    Diabetes mellitus without complication (HCC)    History of echocardiogram 2011   [facility]   History of renal stone    Hyperlipidemia    Hypertension    Hypokalemia    Nephrolithiasis    Obesity    Schizophrenia (HCC)    Wears glasses    Past Surgical History:  Procedure Laterality Date   LUMBAR PUNCTURE  2015   WISDOM TOOTH EXTRACTION       Prior to Admission medications   Medication Sig Start Date End Date Taking? Authorizing Provider  cephALEXin  (KEFLEX ) 500 MG capsule Take 1 capsule (500 mg total) by mouth 2 (two) times daily. 09/23/23  Yes Donnajean Lynwood DEL, PA-C  potassium chloride  SA (KLOR-CON  M) 20 MEQ tablet Take 1 tablet (20 mEq total) by mouth 2 (two) times daily. 09/23/23  Yes Donnajean Lynwood DEL, PA-C  acetaminophen  (TYLENOL ) 500 MG tablet Take 1 tablet (500 mg total) by mouth every 8 (eight) hours as needed. 12/27/22 12/27/23  Tysinger, Alm RAMAN, PA-C  albuterol  (VENTOLIN  HFA) 108 (90 Base) MCG/ACT inhaler Inhale 2 puffs into the lungs every 6 (six) hours as needed for wheezing. 03/23/23 03/22/24  [provider]  amLODipine  (NORVASC ) 10 MG tablet Take 1 tablet (10 mg total) by mouth in the morning. 12/26/22   Tysinger, Alm RAMAN, PA-C  clotrimazole -betamethasone  (LOTRISONE ) cream  Apply 1 Application topically daily. 02/02/23   McDonald, Juliene SAUNDERS, DPM  FLUoxetine  (PROZAC ) 20 MG capsule Take 20 mg by mouth daily. 12/19/22   [provider]  gabapentin  (NEURONTIN ) 100 MG capsule Take 1 capsule (100 mg total) by mouth 3 (three) times daily. 12/27/22 12/27/23  Tysinger, Alm RAMAN, PA-C  lurasidone  (LATUDA ) 40 MG TABS tablet Take 40 mg by mouth daily. 12/19/22   [provider]  meloxicam  (MOBIC ) 7.5 MG tablet Take 7.5 mg by mouth daily. 02/02/23   [provider]  rosuvastatin  (CRESTOR ) 10 MG tablet Take 1 tablet (10 mg total) by mouth daily. Patient not taking: Reported on 04/20/2023 12/26/22 12/26/23  Tysinger, Alm RAMAN, PA-C  solifenacin  (VESICARE ) 10 MG tablet Take 1 tablet (10 mg total) by mouth daily. Patient not taking: Reported on 04/20/2023 12/26/22   Bulah Alm RAMAN, PA-C    Allergies: Paliperidone, Lisinopril , and Sulfa  antibiotics    Review of Systems  Musculoskeletal:  Positive for arthralgias.    Updated Vital Signs BP (!) 166/116 (BP Location: Right Arm)   Pulse 78   Temp 97.8 F (36.6 C) (Oral)   Resp 17   Ht 5' 10 (1.778 m)   Wt 103.4 kg   SpO2 98%   BMI 32.71 kg/m   Physical Exam Vitals and nursing note reviewed.  Constitutional:  General: She is not in acute distress.    Appearance: She is well-developed.  HENT:     Head: Normocephalic and atraumatic.   Eyes:     Conjunctiva/sclera: Conjunctivae normal.    Cardiovascular:     Rate and Rhythm: Normal rate and regular rhythm.     Heart sounds: No murmur heard. Pulmonary:     Effort: Pulmonary effort is normal. No respiratory distress.     Breath sounds: Normal breath sounds.  Abdominal:     Palpations: Abdomen is soft.     Tenderness: There is no abdominal tenderness.   Musculoskeletal:        General: No swelling.     Cervical back: Neck supple.     Comments: Mild tenderness to bilateral knees at the medial joint line.  No significant swelling or ecchymosis  tolerates full range of motion of the knee with some mild discomfort, no significant erythema or warmth, DP pulses are 2+, mild generalized tenderness to the right foot, no deformities noted   Skin:    General: Skin is warm and dry.     Capillary Refill: Capillary refill takes less than 2 seconds.   Neurological:     Mental Status: She is alert.   Psychiatric:        Mood and Affect: Mood normal.     (all labs ordered are listed, but only abnormal results are displayed) Labs Reviewed  URINALYSIS, ROUTINE W REFLEX MICROSCOPIC - Abnormal; Notable for the following components:      Result Value   Color, Urine AMBER (*)    APPearance HAZY (*)    Protein, ur 30 (*)    Leukocytes,Ua SMALL (*)    Bacteria, UA RARE (*)    All other components within normal limits  I-STAT CHEM 8, ED - Abnormal; Notable for the following components:   Potassium 3.0 (*)    BUN 28 (*)    Creatinine, Ser 1.20 (*)    All other components within normal limits    EKG: None  Radiology: DG Knee Complete 4 Views Right Result Date: 09/22/2023 CLINICAL DATA:  injury, fall EXAM: RIGHT KNEE - COMPLETE 4+ VIEW COMPARISON:  None Available. FINDINGS: No acute fracture or dislocation. No joint effusion. Quadriceps insertion enthesophyte. Mild joint space loss of the medial and patellofemoral compartments with tricompartmental osteophyte formation. Soft tissues are unremarkable. IMPRESSION: 1. No acute fracture or dislocation. 2. Mild tricompartmental osteoarthritis of the knee. Electronically Signed   By: Rogelia Myers M.D.   On: 09/22/2023 21:45   DG Knee Complete 4 Views Left Result Date: 09/22/2023 CLINICAL DATA:  injury, fall EXAM: LEFT KNEE - COMPLETE 4+ VIEW COMPARISON:  February 25, 2023 FINDINGS: Similar small ossific fragment along the lateral tibia, chronic, possibly remote avulsion or small loose body in the joint space.No acute fracture or dislocation. No joint effusion. Quadriceps insertion enthesophyte.  Mild joint space loss of the patellofemoral compartment with mild medial joint space loss. Tricompartmental osteophyte formation. Soft tissues are unremarkable. IMPRESSION: 1. No acute fracture or dislocation. 2. Mild osteoarthritis of the knee. Electronically Signed   By: Rogelia Myers M.D.   On: 09/22/2023 21:44     Procedures   Medications Ordered in the ED - No data to display                                  Medical Decision Making  This patient presents to  the ED with chief complaint(s) of bilateral knee pain.  The complaint involves an extensive differential diagnosis and also carries with it a high risk of complications and morbidity.   Pertinent past medical history as listed in HPI  The differential diagnosis includes  Septic arthritis, gout, fracture, DVT, UTI Additional history obtained: Additional history obtained from spouse Records reviewed Care Everywhere/External Records  Assessment and management:   Hemodynamically stable, nontoxic-appearing patient presenting with bilateral atraumatic knee pain for the past several months.  States the pain in will radiate down her legs.  No numbness or tingling.  Does have a history of arthritis and has received cortisone injections in the past.  On exam she does have bilateral knee tenderness to the medial joint line, no significant swelling or ecchymosis.  The knee is stable to varus and valgus.  She has no notable calf swelling.  Her pulses are 2+.  She tolerates full range of motion of the knee without significant discomfort.  She does have some pain generalized in the right foot.  She has generalized tenderness.  Otherwise exam is reassuring.  No erythema or warmth.  Do not suspect septic joint, gout, fracture or dislocation.  She has no risk factors for DVT.  Given the prolonged nature of her symptoms, occurring bilaterally.  DVT much less likely.  She has evidence of arthritis on her knee x-rays.  Will fit with knee braces and  provide referral to orthopedics.  She is able to ambulate with knee brace in place.  Patient's UA was equivocal for UTI.  Patient does report that she has had increased urinary frequency as of late without dysuria.  Will treat with Keflex .  Also noted to have mild hypokalemia.  Reports she does have a history of this.  Will send a prescription for supplementation and have her follow-up with her PCP.  Independent ECG interpretation:  none  Independent labs interpretation:  The following labs were independently interpreted:  UA with small amount of leukocytes, negative nitrites, 11-20 WBCs, i-STAT with hypokalemia of 3, creatinine 1.2  Independent visualization and interpretation of imaging: I independently visualized the following imaging with scope of interpretation limited to determining acute life threatening conditions related to emergency care:  Bilateral knee x-rays no acute abnormality, mild tricompartment arthritis noted bilaterally   Consultations obtained:   none  Disposition:   Patient will be discharged home. The patient has been appropriately medically screened and/or stabilized in the ED. I have low suspicion for any other emergent medical condition which would require further screening, evaluation or treatment in the ED or require inpatient management. At time of discharge the patient is hemodynamically stable and in no acute distress. I have discussed work-up results and diagnosis with patient and answered all questions. Patient is agreeable with discharge plan. We discussed strict return precautions for returning to the emergency department and they verbalized understanding.     Social Determinants of Health:   none  This note was dictated with voice recognition software.  Despite best efforts at proofreading, errors may have occurred which can change the documentation meaning.       Final diagnoses:  Chronic pain of both knees  Acute cystitis without hematuria   Hypokalemia    ED Discharge Orders          Ordered    potassium chloride  SA (KLOR-CON  M) 20 MEQ tablet  2 times daily        09/23/23 0837    cephALEXin  (KEFLEX ) 500 MG capsule  2 times daily        09/23/23 0837               Donnajean Lynwood DEL, PA-C 09/23/23 [id number]    Theadore Ozell HERO, MD 09/27/23 [phone]

## 2023-09-23 NOTE — ED Notes (Signed)
 This RN reviewed discharge instructions with patient. she verbalized understanding and denied any further questions. PT well appearing upon discharge and reports tolerable pain. [employer] applied braces to patient. Pt wheeled out to exit and left with significant other.

## 2023-12-07 ENCOUNTER — Ambulatory Visit (INDEPENDENT_AMBULATORY_CARE_PROVIDER_SITE_OTHER): Admitting: Orthopedic Surgery

## 2023-12-07 ENCOUNTER — Encounter: Payer: Self-pay | Admitting: Orthopedic Surgery

## 2023-12-07 ENCOUNTER — Other Ambulatory Visit: Payer: Self-pay

## 2023-12-07 DIAGNOSIS — E1169 Type 2 diabetes mellitus with other specified complication: Secondary | ICD-10-CM

## 2023-12-07 DIAGNOSIS — B351 Tinea unguium: Secondary | ICD-10-CM | POA: Diagnosis not present

## 2023-12-07 DIAGNOSIS — M25571 Pain in right ankle and joints of right foot: Secondary | ICD-10-CM

## 2023-12-07 DIAGNOSIS — E669 Obesity, unspecified: Secondary | ICD-10-CM | POA: Diagnosis not present

## 2023-12-07 MED ORDER — CLOTRIMAZOLE-BETAMETHASONE 1-0.05 % EX CREA: 1.0000 | TOPICAL_CREAM | Freq: Every day | CUTANEOUS | 0 refills | Status: AC

## 2023-12-07 MED ORDER — GABAPENTIN 300 MG PO CAPS: 300.0000 mg | ORAL_CAPSULE | Freq: Three times a day (TID) | ORAL | 3 refills | Status: AC

## 2023-12-07 NOTE — Progress Notes (Signed)
 Office Visit Note   Patient: Amy Casey           Date of Birth: 10/12/1960           MRN: [id number] Visit Date: 12/07/2023              Requested by: Bulah Alm RAMAN, PA-C [address] [place],  [place] [address] PCP: Bulah Alm RAMAN, PA-C  Chief Complaint  Patient presents with   Right Foot - Nail Problem   Left Foot - Nail Problem   Right Ankle - Pain      HPI: Discussed the use of AI scribe software for clinical note transcription with the patient, who gave verbal consent to proceed.  History of Present Illness Amy Casey is a 63 year old female with type 2 diabetes who presents with knee and ankle pain.  She experiences significant pain in her knees and ankles, with a recent sprain of the right ankle. The pain is severe enough to impact her mobility, stating 'I can barely walk.'  She has a history of type 2 diabetes and onychomycosis, with thick and discolored nails. She mentions difficulty in managing her toenail care due to the pain, saying 'I can't even get my toenails done.'  She has tried various over-the-counter anti-inflammatories such as Aleve  and ibuprofen , but reports that 'none of that stuff works.' She wants to return to work, indicating the pain's impact on her daily life.  She inquires about the possibility of receiving cortisone injections or other medications for pain relief. She is currently not taking Neurontin  but expresses interest in starting it for nerve pain management related to her diabetes. She also requests a renewal of a cream previously prescribed for her feet to manage fungal infections.     Assessment & Plan: Visit Diagnoses:  1. Pain in right ankle and joints of right foot   2. Type 2 diabetes mellitus with obesity (HCC)   3. Onychomycosis     Plan: Assessment and Plan Assessment & Plan Right ankle pain and primary osteoarthritis, status post recent sprain Radiographs show congruent joint space with  calcification of dorsalis pedis and posterior tibial artery. Degenerative changes with bony spurs at talonavicular joint. Ankle stable, no displacement or fractures. Persistent pain post-sprain with osteoarthritis. - Advise continued use of ankle support for pain management. - Educated that pain from sprain may take time to resolve.  Knee pain, likely due to osteoarthritis Severe persistent knee pain likely due to osteoarthritis. OTC medications ineffective. Prednisone not recommended due to diabetes. OxyContin not suitable for chronic pain. Neurontin  considered appropriate for pain management with diabetes. - Prescribe Neurontin , start with one pill at night, increase to three times a day as tolerated.  Onychomycosis Thick, discolored nails consistent with onychomycosis. - Renew prescription for antifungal cream for feet.      Follow-Up Instructions: Return in about 3 months (around 03/07/2024).   Ortho Exam  Patient is alert, oriented, no adenopathy, well-dressed, normal affect, normal respiratory effort. Physical Exam EXTREMITIES: Right ankle normal.      Imaging: No results found. No images are attached to the encounter.  Labs: Lab Results  Component Value Date   HGBA1C 6.1 (H) 12/26/2022   HGBA1C 6.5 (H) 03/18/2022   HGBA1C 6.3 (A) 09/07/2021   LABURIC 4.3 02/23/2021   LABURIC 5.7 05/07/2012   REPTSTATUS 03/14/2022 FINAL 03/13/2022   GRAMSTAIN  02/19/2014    CYTOSPIN SLIDE WBC PRESENT, PREDOMINANTLY MONONUCLEAR NO ORGANISMS SEEN Performed at [facility] Performed at [facility]    GRAMSTAIN  02/19/2014    CYTOSPIN WBC PRESENT, PREDOMINANTLY MONONUCLEAR NO ORGANISMS SEEN    CULT  03/13/2022    NO GROWTH Performed at [facility], [address] [place], [place] [address]    LABORGA PSEUDOMONAS AERUGINOSA (A) 07/16/2017     Lab Results  Component Value Date   ALBUMIN 4.6 04/19/2023   ALBUMIN 4.4 12/26/2022   ALBUMIN 4.3  03/12/2022    Lab Results  Component Value Date   MG 1.8 03/15/2022   MG 2.0 03/14/2022   MG 2.4 03/13/2022   No results found for: VD25OH  No results found for: PREALBUMIN    Latest Ref Rng & Units 09/22/2023    9:19 PM 04/19/2023    7:51 PM 03/15/2022    5:14 AM  CBC EXTENDED  WBC 4.0 - 10.5 K/uL  7.4  8.1   RBC 3.87 - 5.11 MIL/uL  4.90  4.23   Hemoglobin 12.0 - 15.0 g/dL 85.3  85.5  87.6   HCT 36.0 - 46.0 % 43.0  45.4  37.9   Platelets 150 - 400 K/uL  323  312   NEUT# 1.7 - 7.7 K/uL   4.6   Lymph# 0.7 - 4.0 K/uL   2.2      There is no height or weight on file to calculate BMI.  Orders:  Orders Placed This Encounter  Procedures   XR Ankle 2 Views Right   Meds ordered this encounter  Medications   gabapentin  (NEURONTIN ) 300 MG capsule    Sig: Take 1 capsule (300 mg total) by mouth 3 (three) times daily. 3 times a day when necessary neuropathy pain    Dispense:  90 capsule    Refill:  3   clotrimazole -betamethasone  (LOTRISONE ) cream    Sig: Apply 1 Application topically daily.    Dispense:  30 g    Refill:  0     Procedures: No procedures performed  Clinical Data: No additional findings.  ROS:  All other systems negative, except as noted in the HPI. Review of Systems  Objective: Vital Signs: There were no vitals taken for this visit.  Specialty Comments:  No specialty comments available.  PMFS History: Patient Active Problem List   Diagnosis Date Noted   Bilateral primary osteoarthritis of hip 02/02/2023   Enlarged and hypertrophic nails 12/26/2022   Left hip pain 12/26/2022   Diabetes mellitus (HCC) 12/26/2022   Type 2 diabetes mellitus with obesity (HCC) 03/12/2022   Impaired fasting blood sugar 09/07/2021   Need for influenza vaccination 02/23/2021   Screening for diabetes mellitus 02/23/2021   Edema 02/23/2021   Weight gain 02/23/2021   Elevated uric acid in blood 02/23/2021   Need for pneumococcal vaccination 02/23/2021   Screen  for colon cancer 02/23/2021   Paresthesia 02/23/2021   Cerebrovascular disease 12/31/2020   Vaccine refused by patient 12/31/2020   Hyperlipidemia 12/31/2020   Overactive bladder 07/22/2019   Vaccine counseling 07/22/2019   H/O positive serological reaction for syphilis 05/24/2019   Essential hypertension 11/06/2018   Encounter for health maintenance examination in adult 08/21/2018   Catatonia 08/16/2018   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   High risk medication use 07/25/2016   Involuntary commitment    Schizophrenia, paranoid (HCC) 06/16/2015   Noncompliance 06/16/2015   Past Medical History:  Diagnosis Date   Depression    Diabetes mellitus without complication (HCC)    History of echocardiogram 2011  [facility]   History of renal stone    Hyperlipidemia    Hypertension    Hypokalemia    Nephrolithiasis    Obesity    Schizophrenia (HCC)    Wears glasses     Family History  Problem Relation Age of Onset   Diabetes Mother    Heart disease Mother    Chronic Renal Failure Mother    Cancer Maternal Uncle        lung   Kidney disease Mother        dialysis   Stroke Neg Hx    Hypertension Neg Hx    Hyperlipidemia Neg Hx     Past Surgical History:  Procedure Laterality Date   LUMBAR PUNCTURE  2015   WISDOM TOOTH EXTRACTION     Social History   Occupational History   Not on file  Tobacco Use   Smoking status: Never   Smokeless tobacco: Never  Vaping Use   Vaping status: Never Used  Substance and Sexual Activity   Alcohol use: No   Drug use: No   Sexual activity: Not Currently

## 2023-12-20 ENCOUNTER — Ambulatory Visit (INDEPENDENT_AMBULATORY_CARE_PROVIDER_SITE_OTHER): Admitting: Medical

## 2023-12-20 ENCOUNTER — Encounter: Payer: Self-pay | Admitting: Medical

## 2023-12-20 VITALS — BP 128/80 | HR 74 | Wt 217.8 lb

## 2023-12-20 DIAGNOSIS — G8929 Other chronic pain: Secondary | ICD-10-CM

## 2023-12-20 DIAGNOSIS — E11621 Type 2 diabetes mellitus with foot ulcer: Secondary | ICD-10-CM | POA: Diagnosis not present

## 2023-12-20 DIAGNOSIS — E876 Hypokalemia: Secondary | ICD-10-CM

## 2023-12-20 DIAGNOSIS — M1711 Unilateral primary osteoarthritis, right knee: Secondary | ICD-10-CM

## 2023-12-20 DIAGNOSIS — M25571 Pain in right ankle and joints of right foot: Secondary | ICD-10-CM

## 2023-12-20 DIAGNOSIS — E790 Hyperuricemia without signs of inflammatory arthritis and tophaceous disease: Secondary | ICD-10-CM

## 2023-12-20 DIAGNOSIS — N3281 Overactive bladder: Secondary | ICD-10-CM

## 2023-12-20 DIAGNOSIS — I10 Essential (primary) hypertension: Secondary | ICD-10-CM

## 2023-12-20 DIAGNOSIS — E669 Obesity, unspecified: Secondary | ICD-10-CM

## 2023-12-20 DIAGNOSIS — N1831 Chronic kidney disease, stage 3a: Secondary | ICD-10-CM

## 2023-12-20 DIAGNOSIS — E1169 Type 2 diabetes mellitus with other specified complication: Secondary | ICD-10-CM

## 2023-12-20 DIAGNOSIS — F200 Paranoid schizophrenia: Secondary | ICD-10-CM

## 2023-12-20 DIAGNOSIS — E785 Hyperlipidemia, unspecified: Secondary | ICD-10-CM

## 2023-12-20 LAB — LIPID PANEL

## 2023-12-20 MED ORDER — SOLIFENACIN SUCCINATE 10 MG PO TABS: 10.0000 mg | ORAL_TABLET | Freq: Every day | ORAL | 2 refills | Status: AC

## 2023-12-20 MED ORDER — ACETAMINOPHEN 500 MG PO TABS: 500.0000 mg | ORAL_TABLET | Freq: Three times a day (TID) | ORAL | 1 refills | Status: AC | PRN

## 2023-12-20 MED ORDER — AMLODIPINE BESYLATE 10 MG PO TABS: 10.0000 mg | ORAL_TABLET | Freq: Every morning | ORAL | 2 refills | Status: AC

## 2023-12-20 MED ORDER — CAPSAICIN 0.05 % EX CREA: 1.0000 | TOPICAL_CREAM | Freq: Two times a day (BID) | CUTANEOUS | 1 refills | Status: AC

## 2023-12-20 MED ORDER — ROSUVASTATIN CALCIUM 10 MG PO TABS: 10.0000 mg | ORAL_TABLET | Freq: Every day | ORAL | 2 refills | Status: AC

## 2023-12-20 MED ORDER — POTASSIUM CHLORIDE CRYS ER 20 MEQ PO TBCR: 20.0000 meq | EXTENDED_RELEASE_TABLET | Freq: Every day | ORAL | 2 refills | Status: AC

## 2023-12-20 NOTE — Patient Instructions (Signed)
 Arthritis in the, ankle pain -Follow-up with orthopedics -Continue gabapentin  for now, 300 mg 3 times a day -You can use some Tylenol  once or twice a day as needed - Consider using topical Aspercreme or capsaicin  cream over-the-counter daily to the leg and knee and ankle -Continue your knee sleeve -Given your kidney marker you really cannot take ibuprofen  or Aleve  or other anti-inflammatories  Diabetes - Currently diet controlled - Updated labs today - See eye doctor yearly for diabetic eye exam  Hypertension - Continue amlodipine  10 mg daily  Hyperlipidemia - Continue Crestor  rosuvastatin  10 mg daily  Chronic kidney disease stage IIIa given your GFR rate - The goal is to reduce progression of further kidney disease - The goal is to maintain blood pressure less than 120/70 -1 recommendation would be to add a medication called Kerendia daily to help protect the kidney and also help protect against heart failure -drink at least 80 to 100 ounces of water  daily  Low potassium, hypokalemia -Refilled medication today, labs today  Overactive bladder - Continue Vesicare  10 mg daily

## 2023-12-20 NOTE — Progress Notes (Signed)
 Subjective:  Amy Casey is a 63 y.o. female who presents for Chief Complaint  Patient presents with   Acute Visit    Leg concern and disability discussion.      Here for concerns.  She is seeing orthopedics, just saw them recently.  She has issues with her right knee, right ankle, and they prescribed gabapentin  and she is up to 300 mg 3 times a day but still not feeling benefit.  She is still having to use a crutch.  She has been using a crutch or cane for the last 2 months.  No recent injury or trauma but has ongoing pains.  She has had prior steroid injection in the right knee.  She needs refills on some of her medications  She reports compliance with her blood pressure and cholesterol medication  She is currently seeing [facility] for psychiatry care.  She reports compliance with her medications  She feels like she needs to apply for disability as she cannot work standing for long periods and given her recurrent mental health issues she cannot maintain a job  She has been out of her potassium  She has a history of diabetes but has not been in for routine care in a while.  Not currently on medication for diabetes  No other aggravating or relieving factors.    No other c/o.  Past Medical History:  Diagnosis Date   Depression    Diabetes mellitus without complication (HCC)    History of echocardiogram 2011   [facility]   History of renal stone    Hyperlipidemia    Hypertension    Hypokalemia    Nephrolithiasis    Obesity    Schizophrenia (HCC)    Wears glasses    Current Outpatient Medications on File Prior to Visit  Medication Sig Dispense Refill   albuterol  (VENTOLIN  HFA) 108 (90 Base) MCG/ACT inhaler Inhale 2 puffs into the lungs every 6 (six) hours as needed for wheezing.     FLUoxetine  (PROZAC ) 20 MG capsule Take 20 mg by mouth daily.     gabapentin  (NEURONTIN ) 300 MG capsule Take 1 capsule (300 mg total) by mouth 3 (three) times daily. 3 times a day when  necessary neuropathy pain 90 capsule 3   lurasidone  (LATUDA ) 40 MG TABS tablet Take 40 mg by mouth daily.     clotrimazole -betamethasone  (LOTRISONE ) cream Apply 1 Application topically daily. (Patient not taking: Reported on 12/20/2023) 30 g 0   No current facility-administered medications on file prior to visit.     The following portions of the patient's history were reviewed and updated as appropriate: allergies, current medications, past family history, past medical history, past social history, past surgical history and problem list.  ROS Otherwise as in subjective above  Objective: BP 128/80   Pulse 74   Wt 217 lb 12.8 oz (98.8 kg)   SpO2 97%   BMI 31.25 kg/m   General appearance: alert, no distress, well developed, well nourished Back: Nontender, relatively normal range of motion, no deformity  MSK: Tender right lateral ankle, tender throughout the lower leg in general mildly, tender with right knee range of motion but no obvious swelling or laxity Pulses: 2+ radial pulses, 2+ pedal pulses, normal cap refill Ext: no edema Psych: Pleasant, answers questions appropriately, seems to be appropriate mood and affect today Legs neurovascularly intact   Assessment: Encounter Diagnoses  Name Primary?   Arthritis of knee, right Yes   Type 2 diabetes mellitus with foot ulcer,  without long-term current use of insulin  (HCC)    Chronic kidney disease (CKD) stage G3a/A1, moderately decreased glomerular filtration rate (GFR) between 45-59 mL/min/1.73 square meter and albuminuria creatinine ratio less than 30 mg/g (HCC)    Elevated uric acid in blood    Essential hypertension    Hyperlipidemia, unspecified hyperlipidemia type    Overactive bladder    Schizophrenia, paranoid (HCC)    Type 2 diabetes mellitus with obesity    Chronic pain of right ankle    Hypokalemia      Plan: Arthritis in the, ankle pain -Follow-up with orthopedics -Continue gabapentin  for now, 300 mg 3 times a  day -You can use some Tylenol  once or twice a day as needed - Consider using topical Aspercreme or capsaicin  cream over-the-counter daily to the leg and knee and ankle -Continue your knee sleeve -Given your kidney marker you really cannot take ibuprofen  or Aleve  or other anti-inflammatories  Diabetes - Currently diet controlled - Updated labs today - See eye doctor yearly for diabetic eye exam  Hypertension - Continue amlodipine  10 mg daily  Hyperlipidemia - Continue Crestor  rosuvastatin  10 mg daily  Chronic kidney disease stage IIIa given your GFR rate - The goal is to reduce progression of further kidney disease - The goal is to maintain blood pressure less than 120/70 -1 recommendation would be to add a medication called Kerendia daily to help protect the kidney and also help protect against heart failure -drink at least 80 to 100 ounces of water  daily  Low potassium, hypokalemia -Refilled medication today, labs today  Overactive bladder - Continue Vesicare  10 mg daily     Amy Casey was seen today for acute visit.  Diagnoses and all orders for this visit:  Arthritis of knee, right -     Uric acid -     CBC  Type 2 diabetes mellitus with foot ulcer, without long-term current use of insulin  (HCC) -     Hemoglobin A1c -     Comprehensive metabolic panel with GFR -     CBC  Chronic kidney disease (CKD) stage G3a/A1, moderately decreased glomerular filtration rate (GFR) between 45-59 mL/min/1.73 square meter and albuminuria creatinine ratio less than 30 mg/g (HCC)  Elevated uric acid in blood  Essential hypertension  Hyperlipidemia, unspecified hyperlipidemia type -     Comprehensive metabolic panel with GFR -     Lipid panel  Overactive bladder  Schizophrenia, paranoid (HCC)  Type 2 diabetes mellitus with obesity -     Microalbumin/Creatinine Ratio, Urine  Chronic pain of right ankle  Hypokalemia  Other orders -     potassium chloride  SA (KLOR-CON  M) 20  MEQ tablet; Take 1 tablet (20 mEq total) by mouth daily. -     acetaminophen  (TYLENOL ) 500 MG tablet; Take 1 tablet (500 mg total) by mouth every 8 (eight) hours as needed. -     amLODipine  (NORVASC ) 10 MG tablet; Take 1 tablet (10 mg total) by mouth in the morning. -     rosuvastatin  (CRESTOR ) 10 MG tablet; Take 1 tablet (10 mg total) by mouth daily. -     solifenacin  (VESICARE ) 10 MG tablet; Take 1 tablet (10 mg total) by mouth daily. -     Capsaicin  0.05 % CREA; Apply 1 Application topically in the morning and at bedtime.  Spent > 45 minutes face to face with patient in discussion of symptoms, evaluation, plan and recommendations.    Follow up: pendign labs, ortho

## 2023-12-21 ENCOUNTER — Telehealth: Payer: Self-pay | Admitting: Internal Medicine

## 2023-12-21 LAB — URIC ACID: Uric Acid: 4.9 mg/dL (ref 3.0–7.2)

## 2023-12-21 LAB — COMPREHENSIVE METABOLIC PANEL WITH GFR
ALT: 11 IU/L (ref 0–32)
AST: 11 IU/L (ref 0–40)
Albumin: 4.4 g/dL (ref 3.9–4.9)
Alkaline Phosphatase: 71 IU/L (ref 49–135)
BUN/Creatinine Ratio: 21 (ref 12–28)
BUN: 17 mg/dL (ref 8–27)
Bilirubin Total: 0.4 mg/dL (ref 0.0–1.2)
CO2: 23 mmol/L (ref 20–29)
Calcium: 10 mg/dL (ref 8.7–10.3)
Chloride: 103 mmol/L (ref 96–106)
Creatinine, Ser: 0.81 mg/dL (ref 0.57–1.00)
Globulin, Total: 2.8 g/dL (ref 1.5–4.5)
Glucose: 94 mg/dL (ref 70–99)
Potassium: 4.1 mmol/L (ref 3.5–5.2)
Sodium: 144 mmol/L (ref 134–144)
Total Protein: 7.2 g/dL (ref 6.0–8.5)
eGFR: 81 mL/min/1.73 (ref >59)

## 2023-12-21 LAB — CBC
Hematocrit: 40.7 % (ref 34.0–46.6)
Hemoglobin: 13.2 g/dL (ref 11.1–15.9)
MCH: 29.1 pg (ref 26.6–33.0)
MCHC: 32.4 g/dL (ref 31.5–35.7)
MCV: 90 fL (ref 79–97)
Platelets: 336 x10E3/uL (ref 150–450)
RBC: 4.54 x10E6/uL (ref 3.77–5.28)
RDW: 14.1 % (ref 11.7–15.4)
WBC: 7.3 x10E3/uL (ref 3.4–10.8)

## 2023-12-21 LAB — LIPID PANEL
Cholesterol, Total: 190 mg/dL (ref 100–199)
HDL: 57 mg/dL (ref >39)
LDL CALC COMMENT:: 3.3 ratio (ref 0.0–4.4)
LDL Chol Calc (NIH): 118 mg/dL — AB (ref 0–99)
Triglycerides: 81 mg/dL (ref 0–149)
VLDL Cholesterol Cal: 15 mg/dL (ref 5–40)

## 2023-12-21 LAB — MICROALBUMIN / CREATININE URINE RATIO
Creatinine, Urine: 196 mg/dL
Microalb/Creat Ratio: 10 mg/g{creat} (ref 0–29)
Microalbumin, Urine: 19.2 ug/mL

## 2023-12-21 LAB — HEMOGLOBIN A1C
Est. average glucose Bld gHb Est-mCnc: 134 mg/dL
Hgb A1c MFr Bld: 6.3 % — ABNORMAL HIGH (ref 4.8–5.6)

## 2023-12-21 NOTE — Telephone Encounter (Signed)
 Copied from CRM [id number]. Topic: Clinical - Prescription Issue >> Dec 21, 2023  3:11 PM Amy Casey wrote: Reason for CRM: Patient went to the pharmacy to pick up her prescription for acetaminophen  (TYLENOL ) 500 MG tablet. States they would not give as a prescription since she can get over the counter. Is requesting to be prescribed something else she can get as a prescription.  Also states was advised at appointment would be prescribed a muscle relaxer.  Patient can be reached at  [phone]

## 2023-12-22 ENCOUNTER — Telehealth: Payer: Self-pay | Admitting: Orthopedic Surgery

## 2023-12-22 ENCOUNTER — Ambulatory Visit: Payer: Self-pay

## 2023-12-22 NOTE — Telephone Encounter (Signed)
 Spoke with patient.   She says tylenol  isn't helping, gabapentin  isn't helping. She needs something else for pain as she can't walk. Advised her to follow-up with ortho. She says they aren't doing anything or won't prescribe her anything. She can't taking ibuprofen  or aleve  but states that doesn't help either.    Advised that it would be Monday before we could get back to her since you are out of the office today.

## 2023-12-22 NOTE — Telephone Encounter (Signed)
 Already spoke to patient about this. See other message from yesterday

## 2023-12-22 NOTE — Telephone Encounter (Signed)
 FYI Only or Action Required?: Action required by provider: medication refill request.  Patient was last seen in primary care on 12/20/2023 by Bulah Alm RAMAN, PA-C.  Called Nurse Triage reporting Pain.  Symptoms began ongoing problem for patient.  Symptoms are: gradually worsening.  Triage Disposition: See PCP Within 2 Weeks  Patient/caregiver understands and will follow disposition?: No, wishes to speak with PCP      Copied from CRM #[id number]. Topic: Clinical - Prescription Issue >> Dec 21, 2023  3:11 PM Willma R wrote: Reason for CRM: Patient went to the pharmacy to pick up her prescription for acetaminophen  (TYLENOL ) 500 MG tablet. States they would not give as a prescription since she can get over the counter. Is requesting to be prescribed something else she can get as a prescription.  Also states was advised at appointment would be prescribed a muscle relaxer.  Patient can be reached at  [phone] >> Dec 22, 2023  9:41 AM Gustabo D wrote: Pt doesn't have money for tylenol  and wants to know if she can be given something else. Pain for about a year and says nothing is working. Can barely stand.     Reason for Disposition  Leg pain or muscle cramp is a chronic symptom (recurrent or ongoing AND present > 4 weeks)  Answer Assessment - Initial Assessment Questions Patient was seen 9/24 and was told she would be prescribed a muscle relaxer that was never sent to the pharmacy. Patient also states that she is unable to get the prescribed Tylenol  due to her insurance and would like a different prescription for her pain if possible. Please advise.       1. ONSET: When did the pain start?      Chronic problem with arthritis  2. LOCATION: Where is the pain located?      Knees and ankles  3. PAIN: How bad is the pain?    (Scale 1-10; or mild, moderate, severe)     9/10 4. WORK OR EXERCISE: Has there been any recent work or exercise that involved this part of the  body?      No 5. CAUSE: What do you think is causing the leg pain?     Arthritis  6. OTHER SYMPTOMS: Do you have any other symptoms? (e.g., chest pain, back pain, breathing difficulty, swelling, rash, fever, numbness, weakness)     No  Protocols used: Leg Pain-A-AH

## 2023-12-22 NOTE — Telephone Encounter (Signed)
 Patient called and said she is in a lot of pain. Nothing is working and she can barely walk. She ask if you could prescribe her some muscle relaxers or something. CB#[phone]

## 2023-12-22 NOTE — Telephone Encounter (Signed)
 Please see most recent note from PCP. In office x 1 for bilat knee pain with Dr. Harden 12/07/2023 please advise.

## 2023-12-24 ENCOUNTER — Ambulatory Visit: Payer: Self-pay | Admitting: Medical

## 2023-12-24 NOTE — Progress Notes (Signed)
 Diabetes marker stable at 6.3%.  Cholesterol higher than we would like.  Microalbumin kidney marker okay.  Liver kidney and electrolytes okay.  Blood counts okay  I recommend adding cholesterol medicine low-dose daily to lower risk of heart disease and stroke.  If agreeable I will send this to the pharmacy.  Let me know  Plan to recheck in 6 months fasting

## 2023-12-25 ENCOUNTER — Other Ambulatory Visit: Payer: Self-pay | Admitting: Medical

## 2023-12-25 NOTE — Telephone Encounter (Signed)
 Pt was notified. Pt has appt with ortho 10/6

## 2023-12-26 NOTE — Telephone Encounter (Signed)
 Muscle relaxers not appropriate for this pain. Happy to see in office and offer injections for knees

## 2023-12-26 NOTE — Telephone Encounter (Signed)
 Called number provided below. Mail box and is full and cannot leave VM. Will try again later.

## 2023-12-29 ENCOUNTER — Telehealth: Payer: Self-pay | Admitting: Orthopedic Surgery

## 2023-12-29 NOTE — Telephone Encounter (Signed)
 SW pt, she is coming into office on Monday

## 2023-12-29 NOTE — Telephone Encounter (Signed)
 Pt informed. She is coming in on Monday 01/01/24

## 2023-12-29 NOTE — Telephone Encounter (Signed)
 Pt said she missed a call from someone and I see that you have been trying to call her. Pt number is [phone].

## 2024-01-01 ENCOUNTER — Ambulatory Visit (INDEPENDENT_AMBULATORY_CARE_PROVIDER_SITE_OTHER): Admitting: Orthopedic Surgery

## 2024-01-01 DIAGNOSIS — M25562 Pain in left knee: Secondary | ICD-10-CM | POA: Diagnosis not present

## 2024-01-01 DIAGNOSIS — G8929 Other chronic pain: Secondary | ICD-10-CM | POA: Diagnosis not present

## 2024-01-01 DIAGNOSIS — M25561 Pain in right knee: Secondary | ICD-10-CM | POA: Diagnosis not present

## 2024-01-02 ENCOUNTER — Ambulatory Visit: Payer: Self-pay

## 2024-01-02 ENCOUNTER — Encounter: Payer: Self-pay | Admitting: Orthopedic Surgery

## 2024-01-02 LAB — URIC ACID: Uric Acid, Serum: 5.4 mg/dL (ref 2.5–7.0)

## 2024-01-02 NOTE — Telephone Encounter (Signed)
I called pt to advise of message below.  

## 2024-01-02 NOTE — Telephone Encounter (Signed)
-----   Message from Jerona LULLA Sage sent at 01/02/2024  6:50 AM EDT ----- Call patient, uric acid normal, no gout ----- Message ----- From: Rebecka Hose Lab Results In Sent: 01/02/2024   2:53 AM EDT To: Jerona Sage LULLA, MD

## 2024-01-02 NOTE — Progress Notes (Signed)
 Office Visit Note   Patient: Amy Casey           Date of Birth: 21-Nov-1960           MRN: [id number] Visit Date: 01/01/2024              Requested by: Bulah Alm RAMAN, PA-C [address] [place],  [place] [address] PCP: Bulah Alm RAMAN, PA-C  Chief Complaint  Patient presents with   Right Knee - Pain   Left Knee - Pain      HPI: Discussed the use of AI scribe software for clinical note transcription with the patient, who gave verbal consent to proceed.  History of Present Illness Amy Casey is a 63 year old female with arthritis who presents with severe knee pain.  She experiences severe pain in both knees, with the right knee being more affected than the left. The pain is described as unbearable and significantly impacts her ability to stand, work, and perform daily activities, including going to the bathroom at night. The pain is particularly severe at night, disrupting her sleep.  She has a history of receiving cortisone shots, which did not provide relief.  There is a history of gout, although she is unsure of the details and is not currently on medication for it. She recalls being treated for it in the past but does not remember the specifics.     Assessment & Plan: Visit Diagnoses:  1. Chronic pain of both knees     Plan: Assessment and Plan Assessment & Plan Bilateral knee osteoarthritis Chronic bilateral knee osteoarthritis with significant pain, right knee more symptomatic. Previous cortisone injections ineffective. - Administer cortisone injections in both knees. - Assist with disability paperwork.  Gout Gout with no current medication. - Draw blood for uric acid levels. - Call with uric acid results. - Consider additional medication if gout confirmed.      Follow-Up Instructions: No follow-ups on file.   Ortho Exam  Patient is alert, oriented, no adenopathy, well-dressed, normal affect, normal respiratory  effort. Physical Exam MUSCULOSKELETAL: Antalgic gait, uses cane for ambulation. Swelling in both knees, tender medial lateral joint line bilaterally, crepitational range of motion. SKIN: No cellulitis.      Imaging: No results found. No images are attached to the encounter.  Labs: Lab Results  Component Value Date   HGBA1C 6.3 (H) 12/20/2023   HGBA1C 6.1 (H) 12/26/2022   HGBA1C 6.5 (H) 03/18/2022   LABURIC 5.4 01/01/2024   LABURIC 4.9 12/20/2023   LABURIC 4.3 02/23/2021   REPTSTATUS 03/14/2022 FINAL 03/13/2022   GRAMSTAIN  02/19/2014    CYTOSPIN SLIDE WBC PRESENT, PREDOMINANTLY MONONUCLEAR NO ORGANISMS SEEN Performed at [facility] Performed at [facility]    GRAMSTAIN  02/19/2014    CYTOSPIN WBC PRESENT, PREDOMINANTLY MONONUCLEAR NO ORGANISMS SEEN    CULT  03/13/2022    NO GROWTH Performed at [facility], [address] [place], [place] [address]    LABORGA PSEUDOMONAS AERUGINOSA (A) 07/16/2017     Lab Results  Component Value Date   ALBUMIN 4.4 12/20/2023   ALBUMIN 4.6 04/19/2023   ALBUMIN 4.4 12/26/2022    Lab Results  Component Value Date   MG 1.8 03/15/2022   MG 2.0 03/14/2022   MG 2.4 03/13/2022   No results found for: VD25OH  No results found for: PREALBUMIN    Latest Ref Rng & Units 12/20/2023   11:18 AM 09/22/2023    9:19 PM  04/19/2023    7:51 PM  CBC EXTENDED  WBC 3.4 - 10.8 x10E3/uL 7.3   7.4   RBC 3.77 - 5.28 x10E6/uL 4.54   4.90   Hemoglobin 11.1 - 15.9 g/dL 86.7  85.3  85.5   HCT 34.0 - 46.6 % 40.7  43.0  45.4   Platelets 150 - 450 x10E3/uL 336   323      There is no height or weight on file to calculate BMI.  Orders:  Orders Placed This Encounter  Procedures   Uric acid   No orders of the defined types were placed in this encounter.    Procedures: No procedures performed  Clinical Data: No additional findings.  ROS:  All other systems negative, except as noted in the HPI. Review of  Systems  Objective: Vital Signs: There were no vitals taken for this visit.  Specialty Comments:  No specialty comments available.  PMFS History: Patient Active Problem List   Diagnosis Date Noted   Bilateral primary osteoarthritis of hip 02/02/2023   Enlarged and hypertrophic nails 12/26/2022   Left hip pain 12/26/2022   Diabetes mellitus (HCC) 12/26/2022   Type 2 diabetes mellitus with obesity 03/12/2022   Need for influenza vaccination 02/23/2021   Screening for diabetes mellitus 02/23/2021   Weight gain 02/23/2021   Elevated uric acid in blood 02/23/2021   Need for pneumococcal vaccination 02/23/2021   Screen for colon cancer 02/23/2021   Paresthesia 02/23/2021   Cerebrovascular disease 12/31/2020   Vaccine refused by patient 12/31/2020   Hyperlipidemia 12/31/2020   Overactive bladder 07/22/2019   Vaccine counseling 07/22/2019   H/O positive serological reaction for syphilis 05/24/2019   Essential hypertension 11/06/2018   Encounter for health maintenance examination in adult 08/21/2018   Catatonia 08/16/2018   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   High risk medication use 07/25/2016   Involuntary commitment    Schizophrenia, paranoid (HCC) 06/16/2015   Noncompliance 06/16/2015   Past Medical History:  Diagnosis Date   Depression    Diabetes mellitus without complication (HCC)    History of echocardiogram 2011   [facility]   History of renal stone    Hyperlipidemia    Hypertension    Hypokalemia    Nephrolithiasis    Obesity    Schizophrenia (HCC)    Wears glasses     Family History  Problem Relation Age of Onset   Diabetes Mother    Heart disease Mother    Chronic Renal Failure Mother    Cancer Maternal Uncle        lung   Kidney disease Mother        dialysis   Stroke Neg Hx    Hypertension Neg Hx    Hyperlipidemia Neg Hx     Past Surgical History:  Procedure Laterality Date   LUMBAR PUNCTURE  2015   WISDOM TOOTH EXTRACTION      Social History   Occupational History   Not on file  Tobacco Use   Smoking status: Never   Smokeless tobacco: Never  Vaping Use   Vaping status: Never Used  Substance and Sexual Activity   Alcohol use: No   Drug use: No   Sexual activity: Not Currently

## 2024-01-29 ENCOUNTER — Encounter: Payer: Self-pay | Admitting: Radiology

## 2024-02-01 ENCOUNTER — Ambulatory Visit: Admitting: Orthopedic Surgery

## 2024-02-01 ENCOUNTER — Other Ambulatory Visit: Payer: Self-pay

## 2024-02-01 ENCOUNTER — Emergency Department
Admission: EM | Admit: 2024-02-01 | Discharge: 2024-02-03 | Disposition: A | Discharge status: Other Acute Inpatient Hospital | Source: Ambulatory Visit | Attending: Emergency Medicine | Admitting: Emergency Medicine

## 2024-02-01 DIAGNOSIS — E119 Type 2 diabetes mellitus without complications: Secondary | ICD-10-CM | POA: Insufficient documentation

## 2024-02-01 DIAGNOSIS — E870 Hyperosmolality and hypernatremia: Secondary | ICD-10-CM | POA: Insufficient documentation

## 2024-02-01 DIAGNOSIS — N3000 Acute cystitis without hematuria: Secondary | ICD-10-CM | POA: Diagnosis not present

## 2024-02-01 DIAGNOSIS — F200 Paranoid schizophrenia: Secondary | ICD-10-CM | POA: Diagnosis present

## 2024-02-01 DIAGNOSIS — I10 Essential (primary) hypertension: Secondary | ICD-10-CM | POA: Diagnosis not present

## 2024-02-01 DIAGNOSIS — R4589 Other symptoms and signs involving emotional state: Secondary | ICD-10-CM | POA: Insufficient documentation

## 2024-02-01 DIAGNOSIS — R4689 Other symptoms and signs involving appearance and behavior: Secondary | ICD-10-CM

## 2024-02-01 LAB — COMPREHENSIVE METABOLIC PANEL WITH GFR
ALT: 23 U/L (ref 0–44)
AST: 21 U/L (ref 15–41)
Albumin: 4.6 g/dL (ref 3.5–5.0)
Alkaline Phosphatase: 68 U/L (ref 38–126)
Anion gap: 13 (ref 5–15)
BUN: 57 mg/dL — ABNORMAL HIGH (ref 8–23)
CO2: 25 mmol/L (ref 22–32)
Calcium: 9.8 mg/dL (ref 8.9–10.3)
Chloride: 109 mmol/L (ref 98–111)
Creatinine, Ser: 1.6 mg/dL — ABNORMAL HIGH (ref 0.44–1.00)
GFR, Estimated: 36 mL/min — ABNORMAL LOW (ref >60)
Glucose, Bld: 110 mg/dL — ABNORMAL HIGH (ref 70–99)
Potassium: 3.7 mmol/L (ref 3.5–5.1)
Sodium: 147 mmol/L — ABNORMAL HIGH (ref 135–145)
Total Bilirubin: 0.8 mg/dL (ref 0.0–1.2)
Total Protein: 8.5 g/dL — ABNORMAL HIGH (ref 6.5–8.1)

## 2024-02-01 LAB — CBC
HCT: 47.2 % — ABNORMAL HIGH (ref 36.0–46.0)
Hemoglobin: 14.9 g/dL (ref 12.0–15.0)
MCH: 28.7 pg (ref 26.0–34.0)
MCHC: 31.6 g/dL (ref 30.0–36.0)
MCV: 90.9 fL (ref 80.0–100.0)
Platelets: 382 K/uL (ref 150–400)
RBC: 5.19 MIL/uL — ABNORMAL HIGH (ref 3.87–5.11)
RDW: 12.9 % (ref 11.5–15.5)
WBC: 7.9 K/uL (ref 4.0–10.5)
nRBC: 0 % (ref 0.0–0.2)

## 2024-02-01 LAB — URINE DRUG SCREEN, QUALITATIVE (ARMC ONLY)
Amphetamines, Ur Screen: NOT DETECTED
Barbiturates, Ur Screen: NOT DETECTED
Benzodiazepine, Ur Scrn: NOT DETECTED
Cannabinoid 50 Ng, Ur ~~LOC~~: NOT DETECTED
Cocaine Metabolite,Ur ~~LOC~~: NOT DETECTED
MDMA (Ecstasy)Ur Screen: NOT DETECTED
Methadone Scn, Ur: NOT DETECTED
Opiate, Ur Screen: NOT DETECTED
Phencyclidine (PCP) Ur S: NOT DETECTED
Tricyclic, Ur Screen: NOT DETECTED

## 2024-02-01 LAB — URINALYSIS, ROUTINE W REFLEX MICROSCOPIC
Bilirubin Urine: NEGATIVE
Glucose, UA: NEGATIVE mg/dL
Ketones, ur: 5 mg/dL — AB
Nitrite: NEGATIVE
Protein, ur: 30 mg/dL — AB
RBC / HPF: >50 RBC/hpf (ref 0–5)
Specific Gravity, Urine: 1.024 (ref 1.005–1.030)
pH: 5 (ref 5.0–8.0)

## 2024-02-01 LAB — ETHANOL: Alcohol, Ethyl (B): <15 mg/dL (ref <15)

## 2024-02-01 MED ORDER — CEPHALEXIN 500 MG PO CAPS
500.0000 mg | ORAL_CAPSULE | Freq: Two times a day (BID) | ORAL | Status: DC
  Filled 2024-02-01: qty 1

## 2024-02-01 MED ORDER — SODIUM CHLORIDE 0.9 % IV BOLUS
1000.0000 mL | Freq: Once | INTRAVENOUS | Status: AC
  Administered 2024-02-01: 1000 mL via INTRAVENOUS

## 2024-02-01 NOTE — ED Notes (Signed)
 This RN and Rosina, RN attempted IV access unsuccessful. Siadecki, MD made aware. Verbal order to hold IV fluids at this time.

## 2024-02-01 NOTE — BH Assessment (Signed)
 Comprehensive Clinical Assessment (CCA) Note  02/01/2024 Amy Casey [id number]  Chief Complaint:  Chief Complaint  Patient presents with   Altered Mental Status   Visit Diagnosis: Paranoid Schizophrenia   Amy Casey. Amy Casey is a 63 year old female who presents to the ER via her sister Amy Casey), after she was advised to do so by her psychiatric outpatient provider. Per the report of the patient, she last seen her provider in July. The next several appointments were canceled and rescheduled. Thus, she hasn't had her medications. Patient acknowledges she hears voices and they are saying bad things to and about her. As well as and telling her to do "bad things." She didn't feel comfortable sharing what those things were, but ensured writer they didn't involved harming anyone else. Patient states her sleep has decreased and gets approximately two to three hours a night. Per the report of the patient's sister, the patient's daughter called her and shared the patient wasn't doing well. She initially stepped back with the intentions and hopes of the patient's husband daughter follow through with making sure she gets help. But the patient is able to talk them out of doing things. Thus, the sister shared she stepped back in before thing worsen to the degree it has in the past. In the past, she would decompensate to the level of been admitted to the medical floor for being unresponsive. She will go without eating, no sleep and extremely paranoid.  During the interview the patient was calm, cooperative and pleasant. She was able to provide appropriate answers to the questions. She denies SI/HI and V/H. She denies the use of mind-altering substances and no involvement with the legal system.  CCA Screening, Triage and Referral (STR)  Patient Reported Information How did you hear about us ? Other (Comment)  What Is the Reason for Your Visit/Call Today? Patient brought to the ER via her sister Amy Casey),  after she was advised to do so by her psychiatric outpatient provider.  How Long Has This Been Causing You Problems? 1 wk - 1 month  What Do You Feel Would Help You the Most Today? Treatment for Depression or other mood problem   Have You Recently Had Any Thoughts About Hurting Yourself? No  Are You Planning to Commit Suicide/Harm Yourself At This time? No   Flowsheet Row ED from 02/01/2024 in [facility] Emergency Department at [facility] ED from 09/22/2023 in [facility] Emergency Department at [facility] ED from 04/19/2023 in [facility] Emergency Department at [facility]  C-SSRS RISK CATEGORY No Risk No Risk No Risk    Have you Recently Had Thoughts About Hurting Someone Sherral? No  Are You Planning to Harm Someone at This Time? No  Explanation: No data recorded  Have You Used Any Alcohol or Drugs in the Past 24 Hours? No  How Long Ago Did You Use Drugs or Alcohol? No data recorded What Did You Use and How Much? No data recorded  Do You Currently Have a Therapist/Psychiatrist? Yes  Name of Therapist/Psychiatrist:    Have You Been Recently Discharged From Any Office Practice or Programs? No  Explanation of Discharge From Practice/Program: No data recorded    CCA Screening Triage Referral Assessment Type of Contact: Face-to-Face  Telemedicine Service Delivery:   Is this Initial or Reassessment?   Date Telepsych consult ordered in [facility]:    Time Telepsych consult ordered in [facility]:    Location of Assessment: [facility] ED  Provider Location: [facility] ED   Collateral  Involvement: No data recorded  Does Patient Have a Court Appointed Legal Guardian? No  Legal Guardian Contact Information: Ja Pistole Spouse [phone] Spouse is here at hospital  Copy of Legal Guardianship Form: No data recorded Legal Guardian Notified of Arrival: No data recorded Legal Guardian Notified of Pending Discharge: Successfully notified  If Minor and Not Living with  Parent(s), Who has Custody? No data recorded Is CPS involved or ever been involved? Never  Is APS involved or ever been involved? Never   Patient Determined To Be At Risk for Harm To Self or Others Based on Review of Patient Reported Information or Presenting Complaint? Yes, for Self-Harm  Method: No data recorded Availability of Means: No data recorded Intent: No data recorded Notification Required: No data recorded Additional Information for Danger to Others Potential: No data recorded Additional Comments for Danger to Others Potential: No data recorded Are There Guns or Other Weapons in Your Home? No data recorded Types of Guns/Weapons: No data recorded Are These Weapons Safely Secured?                            No data recorded Who Could Verify You Are Able To Have These Secured: No data recorded Do You Have any Outstanding Charges, Pending Court Dates, Parole/Probation? No data recorded Contacted To Inform of Risk of Harm To Self or Others: No data recorded   Does Patient Present under Involuntary Commitment? No    [place] of Residence: [place]   Patient Currently Receiving the Following Services: Medication Management   Determination of Need: Emergent (2 hours)   Options For Referral: ED Referral   CCA Biopsychosocial Patient Reported Schizophrenia/Schizoaffective Diagnosis in Past: Yes   Strengths: Have a support system, some insight and stable housing.   Mental Health Symptoms Depression:  Change in energy/activity; Difficulty Concentrating; Hopelessness; Fatigue   Duration of Depressive symptoms: Duration of Depressive Symptoms: Greater than two weeks   Mania:  None   Anxiety:   Difficulty concentrating; Fatigue; Restlessness; Tension; Worrying   Psychosis:  Hallucinations   Duration of Psychotic symptoms: Duration of Psychotic Symptoms: Greater than six months   Trauma:  N/A   Obsessions:  N/A   Compulsions:  N/A   Inattention:  N/A    Hyperactivity/Impulsivity:  N/A   Oppositional/Defiant Behaviors:  N/A   Emotional Irregularity:  N/A   Other Mood/Personality Symptoms:  No data recorded   Mental Status Exam Appearance and self-care  Stature:  Average   Weight:  Average weight   Clothing:  Neat/clean; Age-appropriate   Grooming:  Normal   Cosmetic use:  None   Posture/gait:  Normal   Motor activity:  -- (Within normal range)   Sensorium  Attention:  Normal   Concentration:  Scattered   Orientation:  X5   Recall/memory:  Normal   Affect and Mood  Affect:  Depressed; Flat   Mood:  Depressed   Relating  Eye contact:  Normal   Facial expression:  Responsive; Anxious   Attitude toward examiner:  Cooperative   Thought and Language  Speech flow: Clear and Coherent   Thought content:  Appropriate to Mood and Circumstances   Preoccupation:  None   Hallucinations:  Auditory; Command (Comment)   Organization:  Loose   Company Secretary of Knowledge:  Average   Intelligence:  Average   Abstraction:  Functional   Judgement:  Impaired   Reality Testing:  Distorted   Insight:  Denial   Decision Making:  Vacilates   Social Functioning  Social Maturity:  Isolates   Social Judgement:  Heedless   Stress  Stressors:  Other (Comment)   Coping Ability:  Overwhelmed   Skill Deficits:  None   Supports:  Family; Friends/Service system     Religion: Religion/Spirituality Are You A Religious Person?: Yes What is Your Religious Affiliation?: Christian  Leisure/Recreation: Leisure / Recreation Do You Have Hobbies?: No  Exercise/Diet: Exercise/Diet Do You Exercise?: No Have You Gained or Lost A Significant Amount of Weight in the Past Six Months?: No Do You Follow a Special Diet?: Yes Type of Diet: Per sister, she hasn't ate in two weeks. Do You Have Any Trouble Sleeping?: Yes Explanation of Sleeping Difficulties: Sleep has decreased to approximately two to three  hours.   CCA Employment/Education Employment/Work Situation:    Education: Education Is Patient Currently Attending School?: No Did You Have An Individualized Education Program (IIEP): No Did You Have Any Difficulty At School?: No Patient's Education Has Been Impacted by Current Illness: No   CCA Family/Childhood History Family and Relationship History: Family history Marital status: Married Does patient have children?: Yes  Childhood History:  Childhood History By whom was/is the patient raised?: Both parents Did patient suffer any verbal/emotional/physical/sexual abuse as a child?: No Did patient suffer from severe childhood neglect?: No Has patient ever been sexually abused/assaulted/raped as an adolescent or adult?: No Was the patient ever a victim of a crime or a disaster?: No Witnessed domestic violence?: No Has patient been affected by domestic violence as an adult?: No   CCA Substance Use Alcohol/Drug Use: Alcohol / Drug Use Pain Medications: See MAR Prescriptions: See MAR Over the Counter: See MAR History of alcohol / drug use?: No history of alcohol / drug abuse Longest period of sobriety (when/how long): n/a   ASAM's:  Six Dimensions of Multidimensional Assessment  Dimension 1:  Acute Intoxication and/or Withdrawal Potential:      Dimension 2:  Biomedical Conditions and Complications:      Dimension 3:  Emotional, Behavioral, or Cognitive Conditions and Complications:     Dimension 4:  Readiness to Change:     Dimension 5:  Relapse, Continued use, or Continued Problem Potential:     Dimension 6:  Recovery/Living Environment:     ASAM Severity Score:    ASAM Recommended Level of Treatment:     Substance use Disorder (SUD)    Recommendations for Services/Supports/Treatments:    Disposition Recommendation per psychiatric provider: Pending Psych Consult   DSM5 Diagnoses: Patient Active Problem List   Diagnosis Date Noted   Bilateral primary  osteoarthritis of hip 02/02/2023   Enlarged and hypertrophic nails 12/26/2022   Left hip pain 12/26/2022   Diabetes mellitus (HCC) 12/26/2022   Type 2 diabetes mellitus with obesity 03/12/2022   Need for influenza vaccination 02/23/2021   Screening for diabetes mellitus 02/23/2021   Weight gain 02/23/2021   Elevated uric acid in blood 02/23/2021   Need for pneumococcal vaccination 02/23/2021   Screen for colon cancer 02/23/2021   Paresthesia 02/23/2021   Cerebrovascular disease 12/31/2020   Vaccine refused by patient 12/31/2020   Hyperlipidemia 12/31/2020   Overactive bladder 07/22/2019   Vaccine counseling 07/22/2019   H/O positive serological reaction for syphilis 05/24/2019   Essential hypertension 11/06/2018   Encounter for health maintenance examination in adult 08/21/2018   Catatonia 08/16/2018   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

## 2024-02-01 NOTE — ED Notes (Signed)
 Black shoes, socks, blue blouse, plaid pajama pants, pink underwear, white bra, 1 silver  ring

## 2024-02-01 NOTE — ED Triage Notes (Addendum)
 ARrives with c/o confusion and paranoia x 2-3 weeks. Has history of Paranoid Schizophrenia. Lives at home with spouse and grandchildren.  Sent to ED by patient's psychiatrist -- patient's medications should have run out in July Psychiatrist called new prescription in last week.  Patient picks up medications at [facility] in [place].  Denies SI/HI. Denies hallucinations.  Calm and cooperative.  ARrives with patient's sister who reports patient has not been eating or drinking for the past two weeks

## 2024-02-01 NOTE — BH Assessment (Signed)
 TTS contacted [facility] for the patient to be seen for psych consult via phone call and [facility] secure chat.

## 2024-02-01 NOTE — ED Notes (Signed)
 Ambulated pt to the toilet

## 2024-02-01 NOTE — ED Notes (Signed)
 Siadecki, MD changed mind and now is wanting IV fluids. IV team consulted for same.

## 2024-02-01 NOTE — ED Notes (Signed)
 Pt states they had their blood drawn and has bandage in place. Lab called and states they dont have any specimens in lab. All rooms and tube station checked in triage and no blood. Labs to be  redraw at this time

## 2024-02-01 NOTE — ED Provider Notes (Signed)
 [facility] Provider Note    Event Date/Time   First MD Initiated Contact with Patient 02/01/24 1643     (approximate)   History   Altered Mental Status   HPI  Amy Casey is a 63 y.o. female with a history of schizophrenia, depression, diabetes, hypertension, and hyperlipidemia who presents with both behavioral and medical concerns.  The sister is the primary historian and reports that the patient has not been compliant with her mental health medications and has had a change in her behavior over the last several weeks, becoming withdrawn, less responsive, confused, and not eating and drinking.  She has had episodes like this before that have resulted in significant electrolyte abnormalities and other medical issues.  She had apparently been off of her mental health medications since July, having never gotten refills, until her sister realized and her psychiatrist restarted her medications about a week ago.  The patient herself denies any complaints other than knee pain which is somewhat chronic.  She denies feeling dizzy or lightheaded.  She has no nausea.  She denies any acute mental health complaints.  She denies paranoia, hearing voices, or any SI or HI.  I reviewed the past medical records.  The patient's most recent outpatient counter was on 10/6 with orthopedics for knee pain.  Her last ED visit was in June also for knee pain.  She was last evaluated by psychiatry in January after presenting with increased paranoia.   Physical Exam   Triage Vital Signs: ED Triage Vitals  Encounter Vitals Group     BP 02/01/24 1416 (!) 154/97     Girls Systolic BP Percentile --      Girls Diastolic BP Percentile --      Boys Systolic BP Percentile --      Boys Diastolic BP Percentile --      Pulse Rate 02/01/24 1418 94     Resp 02/01/24 1416 16     Temp 02/01/24 1418 97.8 F (36.6 C)     Temp Source 02/01/24 1416 Oral     SpO2 02/01/24 1416 96 %      Weight 02/01/24 1416 217 lb 13 oz (98.8 kg)     Height --      Head Circumference --      Peak Flow --      Pain Score 02/01/24 1416 0     Pain Loc --      Pain Education --      Exclude from Growth Chart --     Most recent vital signs: Vitals:   02/01/24 2130 02/01/24 2200  BP: (!) 157/117 (!) 168/84  Pulse: 67 73  Resp:    Temp:    SpO2: 100% 100%     General: Awake, no distress.  CV:  Good peripheral perfusion.  Resp:  Normal effort.  Abd:  Soft and nontender.  No distention.  Other:  Very dry mucous membranes.  Flat affect.  Calm and cooperative.   ED Results / Procedures / Treatments   Labs (all labs ordered are listed, but only abnormal results are displayed) Labs Reviewed  COMPREHENSIVE METABOLIC PANEL WITH GFR - Abnormal; Notable for the following components:      Result Value   Sodium 147 (*)    Glucose, Bld 110 (*)    BUN 57 (*)    Creatinine, Ser 1.60 (*)    Total Protein 8.5 (*)    GFR, Estimated 36 (*)  All other components within normal limits  CBC - Abnormal; Notable for the following components:   RBC 5.19 (*)    HCT 47.2 (*)    All other components within normal limits  URINALYSIS, ROUTINE W REFLEX MICROSCOPIC - Abnormal; Notable for the following components:   Color, Urine AMBER (*)    APPearance CLOUDY (*)    Hgb urine dipstick LARGE (*)    Ketones, ur 5 (*)    Protein, ur 30 (*)    Leukocytes,Ua SMALL (*)    Bacteria, UA RARE (*)    All other components within normal limits  ETHANOL  URINE DRUG SCREEN, QUALITATIVE ([facility] ONLY)     EKG    RADIOLOGY    PROCEDURES:  Critical Care performed: No  Procedures   MEDICATIONS ORDERED IN ED: Medications  cephALEXin  (KEFLEX ) capsule 500 mg (has no administration in time range)  sodium chloride  0.9 % bolus 1,000 mL (0 mLs Intravenous Stopped 02/01/24 2100)     IMPRESSION / MDM / ASSESSMENT AND PLAN / ED COURSE  I reviewed the triage vital signs and the nursing  notes.  63 year old female with PMH as noted above presents with concern for decompensated schizophrenia symptoms with withdrawal and decreased p.o. intake, as well as a concern that she may have an acute medical issue as a result.  On exam she is alert and answering questions appropriately.  She does not appear confused, but is quite withdrawn and with a very flat affect.  She appears to be minimizing her symptoms.  Differential diagnosis includes, but is not limited to, schizophrenia, other acute psychosis, substance-induced mental disorder, dehydration, electrolyte abnormality, hyperglycemia, DKA, HHS.  We will obtain lab workup, give a fluid bolus, and reassess.  I have ordered psychiatry and TTS consults as well for mental health evaluation.  At this time, the patient is agreeable to cooperate with the treatment plan and her sister is here with her.  Therefore, I will hold off on IVC until she is evaluated by psychiatry.  Patient's presentation is most consistent with severe exacerbation of chronic illness.  The patient has been placed in psychiatric observation due to the need to provide a safe environment for the patient while obtaining psychiatric consultation and evaluation, as well as ongoing medical and medication management to treat the patient's condition.  The patient has not been placed under full IVC at this time.   ----------------------------------------- 11:12 PM on 02/01/2024 -----------------------------------------  CMP shows mildly elevated creatinine from baseline consistent with AKI.  Sodium is also slightly elevated.  CBC is unremarkable.  UDS is negative.  Urinalysis shows findings consistent with UTI.  I have started the patient on Keflex  and ordered a liter of fluids.  She is otherwise medically cleared.  Psychiatry consult is pending.  I have signed the patient out to the oncoming ED physician Dr. Waymond.   FINAL CLINICAL IMPRESSION(S) / ED DIAGNOSES   Final diagnoses:   Acute cystitis without hematuria  Behavior concern     Rx / DC Orders   ED Discharge Orders     None        Note:  This document was prepared using Dragon voice recognition software and may include unintentional dictation errors.    Jacolyn Pae, MD 02/01/24 [phone]

## 2024-02-02 DIAGNOSIS — F200 Paranoid schizophrenia: Secondary | ICD-10-CM

## 2024-02-02 LAB — BASIC METABOLIC PANEL WITH GFR
Anion gap: 14 (ref 5–15)
BUN: 44 mg/dL — ABNORMAL HIGH (ref 8–23)
CO2: 23 mmol/L (ref 22–32)
Calcium: 9.2 mg/dL (ref 8.9–10.3)
Chloride: 111 mmol/L (ref 98–111)
Creatinine, Ser: 1.18 mg/dL — ABNORMAL HIGH (ref 0.44–1.00)
GFR, Estimated: 51 mL/min — ABNORMAL LOW (ref >60)
Glucose, Bld: 103 mg/dL — ABNORMAL HIGH (ref 70–99)
Potassium: 3.8 mmol/L (ref 3.5–5.1)
Sodium: 148 mmol/L — ABNORMAL HIGH (ref 135–145)

## 2024-02-02 MED ORDER — GABAPENTIN 300 MG PO CAPS
300.0000 mg | ORAL_CAPSULE | Freq: Three times a day (TID) | ORAL | Status: DC
  Administered 2024-02-02 (×3): 300 mg via ORAL
  Filled 2024-02-02 (×3): qty 1

## 2024-02-02 MED ORDER — FLUOXETINE HCL 20 MG PO CAPS
20.0000 mg | ORAL_CAPSULE | Freq: Every day | ORAL | Status: DC
  Administered 2024-02-02: 20 mg via ORAL
  Filled 2024-02-02: qty 1

## 2024-02-02 MED ORDER — FOSFOMYCIN TROMETHAMINE 3 G PO PACK
3.0000 g | PACK | Freq: Once | ORAL | Status: AC
  Administered 2024-02-02: 3 g via ORAL
  Filled 2024-02-02: qty 3

## 2024-02-02 MED ORDER — ROSUVASTATIN CALCIUM 10 MG PO TABS
10.0000 mg | ORAL_TABLET | Freq: Every day | ORAL | Status: DC
  Administered 2024-02-02: 10 mg via ORAL
  Filled 2024-02-02 (×2): qty 1

## 2024-02-02 MED ORDER — AMLODIPINE BESYLATE 5 MG PO TABS
10.0000 mg | ORAL_TABLET | Freq: Every morning | ORAL | Status: DC
  Administered 2024-02-02: 10 mg via ORAL
  Filled 2024-02-02 (×2): qty 2

## 2024-02-02 MED ORDER — LURASIDONE HCL 40 MG PO TABS
40.0000 mg | ORAL_TABLET | Freq: Every day | ORAL | Status: DC
  Administered 2024-02-02: 40 mg via ORAL
  Filled 2024-02-02 (×2): qty 1

## 2024-02-02 MED ORDER — ACETAMINOPHEN 500 MG PO TABS
500.0000 mg | ORAL_TABLET | Freq: Three times a day (TID) | ORAL | Status: DC | PRN
Start: 2024-02-02 — End: 2024-02-03

## 2024-02-02 MED ORDER — SODIUM CHLORIDE 0.9 % IV SOLN
1.0000 g | Freq: Once | INTRAVENOUS | Status: AC
  Administered 2024-02-02: 1 g via INTRAVENOUS
  Filled 2024-02-02: qty 10

## 2024-02-02 MED ORDER — CEPHALEXIN 500 MG PO CAPS
500.0000 mg | ORAL_CAPSULE | Freq: Four times a day (QID) | ORAL | Status: DC
  Administered 2024-02-02 (×3): 500 mg via ORAL
  Filled 2024-02-02 (×8): qty 1

## 2024-02-02 NOTE — ED Provider Notes (Signed)
 Emergency Medicine Observation Re-evaluation Note  Cathaleen Korol is a 63 y.o. female, seen on rounds today.  Pt initially presented to the ED for complaints of Altered Mental Status Currently, the patient is resting.  Physical Exam  BP (!) 168/84   Pulse 73   Temp 97.9 F (36.6 C) (Oral)   Resp 18   Wt 98.8 kg   SpO2 100%   BMI 31.25 kg/m  Physical Exam .Gen:  No acute distress Resp:  Breathing easily and comfortably, no accessory muscle usage Neuro:  Moving all four extremities, no gross focal neuro deficits Psych:  Resting currently, calm when awake   ED Course / MDM  EKG:   I have reviewed the labs performed to date as well as medications administered while in observation.  Recent changes in the last 24 hours include telepsychiatry evaluated the patient, given that patient is refusing p.o., states that they cannot take her now since she cannot be medically cleared if she is refusing p.o. and is refusing her oral antibiotics for her UTI.  States that she tried to reach out to me but I was in the room with the patient.  Did attempt to call psychiatry at the number provided but no reply.  Did send a message to follow-up.  They recommended restarting Latuda  40 mg daily for schizophrenia and Prozac  daily for depression and anxiety.  Will restart this medications.  Plan  Current plan is for restart recommended medications and plan to reach back out to psychiatry for clarification.  Will also switch out the Keflex  to fosfomycin.  3:22 AM: Pt refused fosfomycin even though verbally she said she will take it. Will give her a dose of IV ceftriaxone  here. Will have psyc reconsulted for additional recs and evaluation since her current state is more consistent with her schizophrenia than her mild UTI.    Waymond Lorelle Cummins, MD 02/02/24 [phone]

## 2024-02-02 NOTE — ED Provider Notes (Signed)
 Clinical Course as of 02/02/24 1601  Fri Feb 02, 2024  [id number] Received signout from past physician: hx of paranoid schizophrenia, non-compliant with meds. Not eating as much for several weeks. Mild UTI, but not likely responsible for symptoms.  Seen by telepsych. [ ]  have psych see her again.  [HD]  9271 went and reevaluated the patient.  Patient has flat affect is withdrawn but is alert and oriented x 3, does seem to be responding to internal stimuli at the moment.  She is not tachycardic.  She did receive her dose of ceftriaxone .  Previous physician did medically clear for for psych.  Previous physician is requesting psych see her again this morning.  [HD]  1301 Psychiatry at bedside and states that they will bring call to the psychiatry unit downstairs.  They are placing her on an IVC [HD]    Clinical Course User Index [HD] Nicholaus Rolland BRAVO, MD      Nicholaus Rolland BRAVO, MD 02/02/24 [phone]

## 2024-02-02 NOTE — ED Notes (Signed)
 This tech attempted to retake vitals, but the pt refused. Will re-attempt later.

## 2024-02-02 NOTE — ED Notes (Signed)
 PT consumed all 100% of her breakfast and drank three cranberry juices.

## 2024-02-02 NOTE — ED Notes (Signed)
 Pt consumed 100% of lunch tray and beverages.

## 2024-02-02 NOTE — Consult Note (Signed)
 [facility] Consult Note  Patient Name: Amy Casey MRN: [id number] DOB: 09-29-1960 DATE OF Consult: 02/02/2024  PRIMARY PSYCHIATRIC DIAGNOSES  1.  Paranoid schizophrenia 2.   3.    RECOMMENDATIONS  Recommendations: Medication recommendations: l Her psychiatric medications can be restarted once she able to take PO medications - Latuda  40 mg daily for schizophrenia and Prozac  20 mg po daily for depression and anxiety.    IV haldol  or multiple other IM antipsychotics can be given while she is acutely psychotic and receiving medical care Non-Medication/therapeutic recommendations: Patient's UTI will not be able to be treated via oral medications. The psychiatric hospital is not likely to take her with a uti that they cannot treat due to her self induced NPO status. Bare minimum she may need medical treatment with parental antipsychotics. I am not currently familiar with the culture of this hospital and what treatment options are able to be done in the [facility] system. I was unable to reach the medical provider for clarification so that these recommendations could be more precise.  Is inpatient psychiatric hospitalization recommended for this patient? Yes (Explain why): AFTER she is medically cleared. She is going to need IV fluids and antibiotics for her UTI. She will also need to be eating and drinking before a psychiatric hospital will be able to take her.  Follow-Up Telepsychiatry C/L services: We will sign off for now. Please re-consult our service if needed for any concerning changes in the patient's condition, discharge planning, or questions. Communication: Treatment team members (and family members if applicable) who were involved in treatment/care discussions and planning, and with whom we spoke or engaged with via secure text/chat, include the following: Epic Chat via treatment team  Thank you for involving us  in the care of this patient. If you have any additional questions or  concerns, please call [phone] and ask for me or the provider on-call.  TELEPSYCHIATRY ATTESTATION & CONSENT  As the provider for this telehealth consult, I attest that I verified the patient's identity using two separate identifiers, introduced myself to the patient, provided my credentials, disclosed my location, and performed this encounter via a HIPAA-compliant, real-time, face-to-face, two-way, interactive audio and video platform and with the full consent and agreement of the patient (or guardian as applicable.)  Patient physical location: [place] [facility]. Telehealth provider physical location: home office in state of [place] .  Video start time: 0020 (Central Time) Video end time: 0030 (Central Time)  IDENTIFYING DATA  Amy Casey is a 63 y.o. year-old female for whom a psychiatric consultation has been ordered by the primary provider. The patient was identified using two separate identifiers.  CHIEF COMPLAINT/REASON FOR CONSULT  Stopped her medications in July and has stopped eating and drinking  HISTORY OF PRESENT ILLNESS (HPI)  The patient per the ED SW note, 63 year old female who presents to the ER via her sister Amy Casey), after she was advised to do so by her psychiatric outpatient provider. Per the report of the patient, she last seen her provider in July. The next several appointments were canceled and rescheduled. Thus, she hasn't had her medications. Patient acknowledges she hears voices and they are saying bad things to and about her. As well as and telling her to do "bad things." She didn't feel comfortable sharing what those things were, but ensured writer they didn't involved harming anyone else. Patient states her sleep has decreased and gets approximately two to three hours a night. Per the report of the patient's sister, the  patient's daughter called her and shared the patient wasn't doing well. She initially stepped back with the intentions and hopes of the patient's  husband daughter follow through with making sure she gets help. But the patient is able to talk them out of doing things. Thus, the sister shared she stepped back in before thing worsen to the degree it has in the past. In the past, she would decompensate to the level of been admitted to the medical floor for being unresponsive. She will go without eating, no sleep and extremely paranoid. .  On exam, the patient is very internally preoccupied. She is actively responding to internal stimuli. She struggles to talk or answer questions. She is very distracted by her voices. She abruptly got up to go to the bathroom but was connected to wires and nursing had to intervene to help her get up safely. She finally returned to bed. She was offered water  and an antibiotic for her UTI and she refused them both.  We talked about how she is going to have to be hospitalized to help her with her UTI and with eating and drinking.  She says yes and no. I attempted to explain and she would say no but then say yes. There is no consistently. She is acutely psychotic.   PAST PSYCHIATRIC HISTORY  Positive history of hospitalizations Unknown suicide attempts, Meds currently are none because she quit Prozac  and Latuda   Otherwise as per HPI above.  PAST MEDICAL HISTORY  Past Medical History:  Diagnosis Date   Depression    Diabetes mellitus without complication (HCC)    History of echocardiogram 2011   [facility]   History of renal stone    Hyperlipidemia    Hypertension    Hypokalemia    Nephrolithiasis    Obesity    Schizophrenia (HCC)    Wears glasses      HOME MEDICATIONS  Facility Ordered Medications  Medication   [COMPLETED] sodium chloride  0.9 % bolus 1,000 mL   cephALEXin  (KEFLEX ) capsule 500 mg   PTA Medications  Medication Sig   lurasidone  (LATUDA ) 40 MG TABS tablet Take 40 mg by mouth daily.   FLUoxetine  (PROZAC ) 20 MG capsule Take 20 mg by mouth daily.   albuterol  (VENTOLIN  HFA) 108 (90 Base) MCG/ACT  inhaler Inhale 2 puffs into the lungs every 6 (six) hours as needed for wheezing.   gabapentin  (NEURONTIN ) 300 MG capsule Take 1 capsule (300 mg total) by mouth 3 (three) times daily. 3 times a day when necessary neuropathy pain   clotrimazole -betamethasone  (LOTRISONE ) cream Apply 1 Application topically daily. (Patient not taking: Reported on 12/20/2023)   potassium chloride  SA (KLOR-CON  M) 20 MEQ tablet Take 1 tablet (20 mEq total) by mouth daily.   acetaminophen  (TYLENOL ) 500 MG tablet Take 1 tablet (500 mg total) by mouth every 8 (eight) hours as needed.   amLODipine  (NORVASC ) 10 MG tablet Take 1 tablet (10 mg total) by mouth in the morning.   rosuvastatin  (CRESTOR ) 10 MG tablet Take 1 tablet (10 mg total) by mouth daily.   solifenacin  (VESICARE ) 10 MG tablet Take 1 tablet (10 mg total) by mouth daily.   Capsaicin  0.05 % CREA Apply 1 Application topically in the morning and at bedtime.     ALLERGIES  Allergies  Allergen Reactions   Paliperidone Swelling and Other (See Comments)    Angioedema, drooling, slurred speech, ptosis   Lisinopril  Swelling, Rash and Other (See Comments)    Angioedema, also    Sulfa   Antibiotics Itching    SOCIAL & SUBSTANCE USE HISTORY  Social History   Socioeconomic History   Marital status: Married    Spouse name: Not on file   Number of children: 2   Years of education: 12   Highest education level: Not on file  Occupational History   Not on file  Tobacco Use   Smoking status: Never   Smokeless tobacco: Never  Vaping Use   Vaping status: Never Used  Substance and Sexual Activity   Alcohol use: No   Drug use: No   Sexual activity: Not Currently  Other Topics Concern   Not on file  Social History Narrative      Married, lives at home with 2 grandchildren, mother in law lives with them, works at [employer].   Exercise - some walking.   12/2020   Social Drivers of Health   Financial Resource Strain: Not on file  Food Insecurity: No Food  Insecurity (03/17/2022)   Hunger Vital Sign    Worried About Running Out of Food in the Last Year: Never true    Ran Out of Food in the Last Year: Never true  Transportation Needs: No Transportation Needs (03/17/2022)   PRAPARE - Administrator, Civil Service (Medical): No    Lack of Transportation (Non-Medical): No  Physical Activity: Not on file  Stress: Not on file  Social Connections: Not on file   Social History   Tobacco Use  Smoking Status Never  Smokeless Tobacco Never   Social History   Substance and Sexual Activity  Alcohol Use No   Social History   Substance and Sexual Activity  Drug Use No    Additional pertinent information .  FAMILY HISTORY  Family History  Problem Relation Age of Onset   Diabetes Mother    Heart disease Mother    Chronic Renal Failure Mother    Cancer Maternal Uncle        lung   Kidney disease Mother        dialysis   Stroke Neg Hx    Hypertension Neg Hx    Hyperlipidemia Neg Hx    Family Psychiatric History (if known):    MENTAL STATUS EXAM (MSE)  Mental Status Exam: General Appearance: Disheveled  Orientation:  Other:  confused oriented to self  Memory:  NA  Concentration:  Concentration: Poor  Recall:  NA  Attention  Poor  Eye Contact:  Absent  Speech:  Garbled, Slow, and Slurred  Language:  Poor  Volume:  Decreased  Mood: pt unable to answer  Affect:  Flat  Thought Process:  Disorganized  Thought Content:  Hallucinations: Auditory Command:  the voices tell her to do things but not harm herself or others and Paranoid Ideation  Suicidal Thoughts:  No  Homicidal Thoughts:  No  Judgement:  Impaired  Insight:  Lacking  Psychomotor Activity:  Normal  Akathisia:  No  Fund of Knowledge:  Poor    Assets:  Social Support  Cognition:  Impaired,  Moderate  ADL's:  Intact  AIMS (if indicated):       VITALS  Blood pressure (!) 168/84, pulse 73, temperature 97.9 F (36.6 C), temperature source Oral, resp.  rate 18, weight 98.8 kg, SpO2 100%.  LABS  Admission on 02/01/2024  Component Date Value Ref Range Status   Sodium 02/01/2024 147 (H)  135 - 145 mmol/L Final   Potassium 02/01/2024 3.7  3.5 - 5.1 mmol/L Final   Chloride 02/01/2024  109  98 - 111 mmol/L Final   CO2 02/01/2024 25  22 - 32 mmol/L Final   Glucose, Bld 02/01/2024 110 (H)  70 - 99 mg/dL Final   Glucose reference range applies only to samples taken after fasting for at least 8 hours.   BUN 02/01/2024 57 (H)  8 - 23 mg/dL Final   Creatinine, Ser 02/01/2024 1.60 (H)  0.44 - 1.00 mg/dL Final   Calcium  02/01/2024 9.8  8.9 - 10.3 mg/dL Final   Total Protein 88/93/7974 8.5 (H)  6.5 - 8.1 g/dL Final   Albumin 88/93/7974 4.6  3.5 - 5.0 g/dL Final   AST 88/93/7974 21  15 - 41 U/L Final   ALT 02/01/2024 23  0 - 44 U/L Final   Alkaline Phosphatase 02/01/2024 68  38 - 126 U/L Final   Total Bilirubin 02/01/2024 0.8  0.0 - 1.2 mg/dL Final   GFR, Estimated 02/01/2024 36 (L)  >60 mL/min Final   Comment: (NOTE) Calculated using the CKD-EPI Creatinine Equation (2021)    Anion gap 02/01/2024 13  5 - 15 Final   Performed at [name], [address] [place], [place] [address]   Alcohol, Ethyl (B) 02/01/2024 <15  <15 mg/dL Final   Comment: (NOTE) For medical purposes only. Performed at [facility], [address] [place], [place] [address]    WBC 02/01/2024 7.9  4.0 - 10.5 K/uL Final   RBC 02/01/2024 5.19 (H)  3.87 - 5.11 MIL/uL Final   Hemoglobin 02/01/2024 14.9  12.0 - 15.0 g/dL Final   HCT 88/93/7974 47.2 (H)  36.0 - 46.0 % Final   MCV 02/01/2024 90.9  80.0 - 100.0 fL Final   MCH 02/01/2024 28.7  26.0 - 34.0 pg Final   MCHC 02/01/2024 31.6  30.0 - 36.0 g/dL Final   RDW 88/93/7974 12.9  11.5 - 15.5 % Final   Platelets 02/01/2024 382  150 - 400 K/uL Final   nRBC 02/01/2024 0.0  0.0 - 0.2 % Final   Performed at [facility], [address] [place], [place] [address]   Tricyclic, Ur Screen 02/01/2024  NONE DETECTED  NONE DETECTED Final   Amphetamines, Ur Screen 02/01/2024 NONE DETECTED  NONE DETECTED Final   MDMA (Ecstasy)Ur Screen 02/01/2024 NONE DETECTED  NONE DETECTED Final   Cocaine Metabolite,Ur [place] 02/01/2024 NONE DETECTED  NONE DETECTED Final   Opiate, Ur Screen 02/01/2024 NONE DETECTED  NONE DETECTED Final   Phencyclidine (PCP) Ur S 02/01/2024 NONE DETECTED  NONE DETECTED Final   Cannabinoid 50 Ng, Ur [place] 02/01/2024 NONE DETECTED  NONE DETECTED Final   Barbiturates, Ur Screen 02/01/2024 NONE DETECTED  NONE DETECTED Final   Benzodiazepine, Ur Scrn 02/01/2024 NONE DETECTED  NONE DETECTED Final   Methadone Scn, Ur 02/01/2024 NONE DETECTED  NONE DETECTED Final   Comment: (NOTE) Tricyclics + metabolites, urine    Cutoff 1000 ng/mL Amphetamines + metabolites, urine  Cutoff 1000 ng/mL MDMA (Ecstasy), urine              Cutoff 500 ng/mL Cocaine Metabolite, urine          Cutoff 300 ng/mL Opiate + metabolites, urine        Cutoff 300 ng/mL Phencyclidine (PCP), urine         Cutoff 25 ng/mL Cannabinoid, urine                 Cutoff 50 ng/mL Barbiturates + metabolites, urine  Cutoff 200 ng/mL Benzodiazepine, urine  Cutoff 200 ng/mL Methadone, urine                   Cutoff 300 ng/mL  The urine drug screen provides only a preliminary, unconfirmed analytical test result and should not be used for non-medical purposes. Clinical consideration and professional judgment should be applied to any positive drug screen result due to possible interfering substances. A more specific alternate chemical method must be used in order to obtain a confirmed analytical result. Gas chromatography / mass spectrometry (GC/MS) is the preferred confirm                          atory method. Performed at [facility], [address] [place], [place] [address]    Color, Urine 02/01/2024 AMBER (A)  YELLOW Final   BIOCHEMICALS MAY BE AFFECTED BY COLOR   APPearance 02/01/2024 CLOUDY (A)   CLEAR Final   Specific Gravity, Urine 02/01/2024 1.024  1.005 - 1.030 Final   pH 02/01/2024 5.0  5.0 - 8.0 Final   Glucose, UA 02/01/2024 NEGATIVE  NEGATIVE mg/dL Final   Hgb urine dipstick 02/01/2024 LARGE (A)  NEGATIVE Final   Bilirubin Urine 02/01/2024 NEGATIVE  NEGATIVE Final   Ketones, ur 02/01/2024 5 (A)  NEGATIVE mg/dL Final   Protein, ur 88/93/7974 30 (A)  NEGATIVE mg/dL Final   Nitrite 88/93/7974 NEGATIVE  NEGATIVE Final   Leukocytes,Ua 02/01/2024 SMALL (A)  NEGATIVE Final   RBC / HPF 02/01/2024 >50  0 - 5 RBC/hpf Final   WBC, UA 02/01/2024 11-20  0 - 5 WBC/hpf Final   Bacteria, UA 02/01/2024 RARE (A)  NONE SEEN Final   Squamous Epithelial / HPF 02/01/2024 0-5  0 - 5 /HPF Final   Mucus 02/01/2024 PRESENT   Final   Performed at [facility], [address] [place], [place] [address]    PSYCHIATRIC REVIEW OF SYSTEMS (ROS)  ROS: Notable for the following relevant positive findings: ROS  Additional findings:      Musculoskeletal: No abnormal movements observed      Gait & Station: Normal      Pain Screening: Denies      Nutrition & Dental Concerns: patient is not eating or drinking and is going to need medical intervention  RISK FORMULATION/ASSESSMENT  Is the patient experiencing any suicidal or homicidal ideations: No       Explain if yes: she denied this to staff Protective factors considered for safety management: family  Risk factors/concerns considered for safety management:  Barriers to accessing treatment Unwillingness to seek help  Is there a safety management plan with the patient and treatment team to minimize risk factors and promote protective factors: Yes           Explain: patient is being monitored in the ED Is crisis care placement or psychiatric hospitalization recommended: Yes     Based on my current evaluation and risk assessment, patient is determined at this time to be at:  High risk  *RISK ASSESSMENT Risk assessment is a dynamic  process; it is possible that this patient's condition, and risk level, may change. This should be re-evaluated and managed over time as appropriate. Please re-consult psychiatric consult services if additional assistance is needed in terms of risk assessment and management. If your team decides to discharge this patient, please advise the patient how to best access emergency psychiatric services, or to call 911, if their condition worsens or they feel unsafe in any  way.   Scottie Metayer A Jaicee Michelotti, NP Telepsychiatry Consult Services

## 2024-02-02 NOTE — ED Notes (Signed)
 Pt. Consumed 100% of dinner tray and drank 1 ginger ale and 1 water 

## 2024-02-02 NOTE — ED Notes (Addendum)
 Ambulated pt to the toilet and back to bed.  Pt on with telepsych

## 2024-02-02 NOTE — Consult Note (Signed)
 [facility] Psychiatric Consult Follow-up  Patient Name: .Jannine Abreu  MRN: [id number]  DOB: [birth year]  Consult Order details:  Orders (From admission, onward)     Start     Ordered   02/01/24 1710  CONSULT TO CALL ACT TEAM       Ordering Provider: Jacolyn Pae, MD  Provider:  (Not yet assigned)  Question:  Reason for Consult?  Answer:  Psych consult   02/01/24 1710   02/01/24 1710  IP CONSULT TO PSYCHIATRY       Ordering Provider: Jacolyn Pae, MD  Provider:  (Not yet assigned)  Question Answer Comment  Reason for consult: Other (see comments)   Comments: Schizophrenia, decompensation      02/01/24 1710             Mode of Visit: In person    Psychiatry Consult Evaluation  Service Date: February 02, 2024 LOS:  LOS: 0 days  Chief Complaint I do not want to  Primary Psychiatric Diagnoses  Paranoid schizophrenia   Assessment  Beverlie Kurihara is a 63 y.o. female admitted: Presented to the ED  Patient was seen initially by [facility] telehealth providers.  Per [facility]:  Recommendations: Medication recommendations: l Her psychiatric medications can be restarted once she able to take PO medications - Latuda  40 mg daily for schizophrenia and Prozac  20 mg po daily for depression and anxiety.    IV haldol  or multiple other IM antipsychotics can be given while she is acutely psychotic and receiving medical care Non-Medication/therapeutic recommendations: Patient's UTI will not be able to be treated via oral medications. The psychiatric hospital is not likely to take her with a uti that they cannot treat due to her self induced NPO status. Bare minimum she may need medical treatment with parental antipsychotics. I am not currently familiar with the culture of this hospital and what treatment options are able to be done in the [facility] system. I was unable to reach the medical provider for clarification so that these recommendations could be more precise.  Is  inpatient psychiatric hospitalization recommended for this patient? Yes (Explain why): AFTER she is medically cleared. She is going to need IV fluids and antibiotics for her UTI. She will also need to be eating and drinking before a psychiatric hospital will be able to take her.   On assessment today, per review of chart patient was compliant with oral medications today.  Per ED physician, patient is medically appropriate to proceed with inpatient psychiatric admission.  Patient is refusing voluntary inpatient psych admission.  However, patient is refusing to eat and drink and care for self safely.  Per review of notes, patient has canceled and rescheduled multiple of her outpatient psych visits recently.  Patient also endorsed hearing voices telling her bad things that she would not further elaborate on.  Family also voiced concerns about patient's current condition and risk to self, as patient continues to refuse to eat and drink and often requires multiple redirections from family members at bedside.  At this time, it is deemed that patient is a current risk to self and condition would likely decline without proper inpatient psychiatric treatment.  IVC papers were initiated by this provider and patient is recommended for inpatient psychiatric admission.  Diagnoses:  Active Hospital problems: Active Problems:   Schizophrenia, paranoid (HCC)    Plan   ## Psychiatric Medication Recommendations:  Defer to inpatient unit  ## Medical Decision Making Capacity: Not specifically addressed in this encounter  ##  Further Work-up:    -- Pertinent labwork reviewed earlier this admission includes: CMP, ethanol, CBC, urine drug screen   ## Disposition:-- We recommend inpatient psychiatric hospitalization after medical hospitalization. Patient has been involuntarily committed on 02/02/2024.   ## Behavioral / Environmental: -Utilize compassion and acknowledge the patient's experiences while setting clear  and realistic expectations for care.    ## Safety and Observation Level:  - Based on my clinical evaluation, I estimate the patient to be at low risk of self harm in the current setting. - At this time, we recommend  routine. This decision is based on my review of the chart including patient's history and current presentation, interview of the patient, mental status examination, and consideration of suicide risk including evaluating suicidal ideation, plan, intent, suicidal or self-harm behaviors, risk factors, and protective factors. This judgment is based on our ability to directly address suicide risk, implement suicide prevention strategies, and develop a safety plan while the patient is in the clinical setting. Please contact our team if there is a concern that risk level has changed.  CSSR Risk Category:C-SSRS RISK CATEGORY: No Risk  Suicide Risk Assessment: Patient has following modifiable risk factors for suicide: medication noncompliance, which we are addressing by recommending inpatient psychiatric admission for further monitoring and stabilization. Patient has following non-modifiable or demographic risk factors for suicide: psychiatric hospitalization Patient has the following protective factors against suicide: Supportive family  Thank you for this consult request. Recommendations have been communicated to the primary team.  We will sign off at this time.   Zelda Sharps, NP        History of Present Illness  Relevant Aspects of Hospital ED   Patient Report: Obtained by [facility] on initial exam see below The patient per the ED SW note, 63 year old female who presents to the ER via her sister [name]), after she was advised to do so by her psychiatric outpatient provider. Per the report of the patient, she last seen her provider in July. The next several appointments were canceled and rescheduled. Thus, she hasn't had her medications. Patient acknowledges she hears voices and they are  saying bad things to and about her. As well as and telling her to do "bad things." She didn't feel comfortable sharing what those things were, but ensured writer they didn't involved harming anyone else. Patient states her sleep has decreased and gets approximately two to three hours a night. Per the report of the patient's sister, the patient's daughter called her and shared the patient wasn't doing well. She initially stepped back with the intentions and hopes of the patient's husband daughter follow through with making sure she gets help. But the patient is able to talk them out of doing things. Thus, the sister shared she stepped back in before thing worsen to the degree it has in the past. In the past, she would decompensate to the level of been admitted to the medical floor for being unresponsive. She will go without eating, no sleep and extremely paranoid. .   On exam, the patient is very internally preoccupied. She is actively responding to internal stimuli. She struggles to talk or answer questions. She is very distracted by her voices. She abruptly got up to go to the bathroom but was connected to wires and nursing had to intervene to help her get up safely. She finally returned to bed. She was offered water  and an antibiotic for her UTI and she refused them both.  We talked about how she  is going to have to be hospitalized to help her with her UTI and with eating and drinking.  She says yes and no. I attempted to explain and she would say no but then say yes. There is no consistently. She is acutely psychotic.       Psychiatric and Social History  Psychiatric History:  Obtained by [facility] see below Positive history of hospitalizations Unknown suicide attempts, Meds currently are none because she quit Prozac  and Latuda    Otherwise as per HPI above.  Exam Findings  Physical Exam: Deferred to EDP- note reviewed   Vital Signs:  Temp:  [97.9 F (36.6 C)-98.3 F (36.8 C)] 98.3 F (36.8  C) (11/07 0736) Pulse Rate:  [51-80] 62 (11/07 0736) Resp:  [17-18] 17 (11/07 0736) BP: (140-168)/(76-117) 156/99 (11/07 0736) SpO2:  [97 %-100 %] 99 % (11/07 0736) Blood pressure (!) 156/99, pulse 62, temperature 98.3 F (36.8 C), temperature source Oral, resp. rate 17, weight 98.8 kg, SpO2 99%. Body mass index is 31.25 kg/m.      Other History   These have been pulled in through the EMR, reviewed, and updated if appropriate.  Family History:  The patient's family history includes Cancer in her maternal uncle; Chronic Renal Failure in her mother; Diabetes in her mother; Heart disease in her mother; Kidney disease in her mother.  Medical History: Past Medical History:  Diagnosis Date   Depression    Diabetes mellitus without complication (HCC)    History of echocardiogram 2011   [facility]   History of renal stone    Hyperlipidemia    Hypertension    Hypokalemia    Nephrolithiasis    Obesity    Schizophrenia (HCC)    Wears glasses     Surgical History: Past Surgical History:  Procedure Laterality Date   LUMBAR PUNCTURE  2015   WISDOM TOOTH EXTRACTION       Medications:   Current Facility-Administered Medications:    acetaminophen  (TYLENOL ) tablet 500 mg, 500 mg, Oral, Q8H PRN, Nicholaus, Hillary E, MD   amLODipine  (NORVASC ) tablet 10 mg, 10 mg, Oral, q AM, Nicholaus Maize E, MD, 10 mg at 02/02/24 1040   cephALEXin  (KEFLEX ) capsule 500 mg, 500 mg, Oral, Q6H, Nicholaus Maize E, MD, 500 mg at 02/02/24 1211   FLUoxetine  (PROZAC ) capsule 20 mg, 20 mg, Oral, Daily, Tan, Lorelle Cummins, MD, 20 mg at 02/02/24 1040   gabapentin  (NEURONTIN ) capsule 300 mg, 300 mg, Oral, TID, Nicholaus Maize E, MD, 300 mg at 02/02/24 1040   lurasidone  (LATUDA ) tablet 40 mg, 40 mg, Oral, Q breakfast, Tan, Lorelle Cummins, MD, 40 mg at 02/02/24 1039   rosuvastatin  (CRESTOR ) tablet 10 mg, 10 mg, Oral, Daily, Nicholaus Maize BRAVO, MD  Current Outpatient Medications:    acetaminophen  (TYLENOL ) 500 MG tablet, Take 1 tablet  (500 mg total) by mouth every 8 (eight) hours as needed., Disp: 60 tablet, Rfl: 1   albuterol  (VENTOLIN  HFA) 108 (90 Base) MCG/ACT inhaler, Inhale 2 puffs into the lungs every 6 (six) hours as needed for wheezing., Disp: , Rfl:    amLODipine  (NORVASC ) 10 MG tablet, Take 1 tablet (10 mg total) by mouth in the morning., Disp: 90 tablet, Rfl: 2   Capsaicin  0.05 % CREA, Apply 1 Application topically in the morning and at bedtime., Disp: 120 g, Rfl: 1   clotrimazole -betamethasone  (LOTRISONE ) cream, Apply 1 Application topically daily. (Patient not taking: Reported on 12/20/2023), Disp: 30 g, Rfl: 0   FLUoxetine  (PROZAC ) 20 MG capsule, Take  20 mg by mouth daily., Disp: , Rfl:    gabapentin  (NEURONTIN ) 300 MG capsule, Take 1 capsule (300 mg total) by mouth 3 (three) times daily. 3 times a day when necessary neuropathy pain, Disp: 90 capsule, Rfl: 3   lurasidone  (LATUDA ) 40 MG TABS tablet, Take 40 mg by mouth daily., Disp: , Rfl:    potassium chloride  SA (KLOR-CON  M) 20 MEQ tablet, Take 1 tablet (20 mEq total) by mouth daily., Disp: 90 tablet, Rfl: 2   rosuvastatin  (CRESTOR ) 10 MG tablet, Take 1 tablet (10 mg total) by mouth daily., Disp: 90 tablet, Rfl: 2   solifenacin  (VESICARE ) 10 MG tablet, Take 1 tablet (10 mg total) by mouth daily., Disp: 90 tablet, Rfl: 2  Allergies: Allergies  Allergen Reactions   Paliperidone Swelling and Other (See Comments)    Angioedema, drooling, slurred speech, ptosis   Lisinopril  Swelling, Rash and Other (See Comments)    Angioedema, also    Sulfa  Antibiotics Itching    Zelda Sharps, NP This note was created using Scientist, clinical (histocompatibility and immunogenetics). Please excuse any inadvertent transcription errors. Case was discussed with supervising physician Dr. Jadapalle who is agreeable with current plan.

## 2024-02-02 NOTE — ED Notes (Signed)
 Vina Kapur (sister) is here .

## 2024-02-02 NOTE — ED Notes (Signed)
 Ivc pending inpt psych per NP

## 2024-02-02 NOTE — Progress Notes (Signed)
 Per [facility], Kathrine is at capacity.  Patient has been referred to the following facilities:  Service Provider Phone  [facility]  [phone]  [facility]  [phone]  [facility]  [phone]  [place] [facility]FA[facility]  [phone]  [facility]  [phone]  [facility]  [phone]  [facility]  [phone]  [facility]  [phone]  [facility]  [phone]  [facility]  [phone]  [facility]  [phone]  [facility]  [phone]  [facility]  [phone]  [facility]  [phone]  [facility]  [phone]  [facility]  [phone], [place] [phone]

## 2024-02-02 NOTE — ED Notes (Signed)
 Pt brief changed out and pt given hospital underwear. Pt unable to urinate at this time.

## 2024-02-02 NOTE — BH Assessment (Signed)
 TTS called and updated the patient's sister(Paula) of the status.

## 2024-02-02 NOTE — ED Notes (Signed)
 Meal tray given

## 2024-02-03 LAB — URINE CULTURE: Culture: <10000 — AB

## 2024-02-03 NOTE — ED Notes (Signed)
 Message left with [facility] to return call for report.

## 2024-02-03 NOTE — BH Assessment (Signed)
 PATIENT BED AVAILABLE AFTER 8AM ON 02/03/24  Patient has been accepted to [facility].  Accepting physician is Dr. Ruther.  Call report to [phone].  Representative was Intake Rosaline.   ER Staff is aware of it:  [facility] ER Secretary  Dr. Waymond, ER MD  [name] Patient's Nurse

## 2024-02-03 NOTE — ED Notes (Signed)
 IVC/Consult completed/ Pt Accepted to [facility]

## 2024-02-03 NOTE — BH Assessment (Signed)
 Referral information for Psychiatric Hospitalization faxed to;   [facility]  [phone]  [facility]  [phone]  [facility]  [phone]  [place] EFAX  [phone]  [facility]  [phone]  [facility]  [phone]  [facility]  [phone]  [facility]  [phone]  [facility]  [phone]  [facility]  [phone]  [facility]  [phone]  [facility]  [phone]   [facility]  [phone]   [facility]  [phone]  [facility]  [phone]  [facility]  [phone]  [facility]  [phone]

## 2024-02-03 NOTE — ED Notes (Signed)
 Presents suspicious and guarded upon approach. Asking where multiple people are. Allowed writer to obtain vitals but declined snack when offered. Cup of water  left at bedside. Encouraged to let nursing staff know of any concerns or needs.

## 2024-02-03 NOTE — ED Notes (Signed)
 Have not heard from [facility], returned called and left second message, [facility] here to transport, charge aware states to send pt and continue to attempt to call. Attempted to give pt AM meds, pt refuses.

## 2024-02-03 NOTE — ED Notes (Signed)
 Return call from [facility], [place].  Report provided, no other questions, she is aware pt is en route.

## 2024-02-03 NOTE — ED Notes (Signed)
 To room to escort pt to [facility], pt wet with urine.  Pt changed into dry clothes.   Escorted to [facility] and to [facility] car.

## 2024-02-03 NOTE — ED Provider Notes (Addendum)
 Emergency Medicine Observation Re-evaluation Note  Amy Casey is a 63 y.o. female, seen on rounds today.  Pt initially presented to the ED for complaints of Altered Mental Status Currently, the patient is resting in no distress.  Physical Exam  BP (!) 138/94 (BP Location: Right Arm)   Pulse 92   Temp 98.1 F (36.7 C) (Oral)   Resp 18   Wt 98.8 kg   SpO2 97%   BMI 31.25 kg/m  Physical Exam General: Resting in no distress Cardiac: Appears well-perfused Lungs: No respiratory distress Psych: Not agitated  ED Course / MDM  EKG:   I have reviewed the labs performed to date as well as medications administered while in observation.  Recent changes in the last 24 hours include administration of antibiotics for underlying UTI.  Plan  Current plan is for psychiatric disposition, patient is on IVC.    Update at 8:30 AM --accepted to [facility], pending transport.  Initial EMTALA  done.  Update 9 AM --patient transported to psychiatric facility.     Clarine Ozell LABOR, MD 02/03/24 [phone]

## 2024-03-07 ENCOUNTER — Ambulatory Visit: Admitting: Orthopedic Surgery

## 2024-06-11 ENCOUNTER — Ambulatory Visit: Payer: Self-pay | Admitting: Medical
